# Patient Record
Sex: Female | Born: 1945 | Race: White | Hispanic: No | Smoking: Never smoker
Health system: Southern US, Community
[De-identification: ages and names within clinical notes are randomized; demographics above are authoritative.]

## PROBLEM LIST (undated history)

## (undated) DIAGNOSIS — Z86718 Personal history of other venous thrombosis and embolism: Secondary | ICD-10-CM

## (undated) DIAGNOSIS — J45909 Unspecified asthma, uncomplicated: Secondary | ICD-10-CM

## (undated) DIAGNOSIS — B019 Varicella without complication: Secondary | ICD-10-CM

## (undated) DIAGNOSIS — T7840XA Allergy, unspecified, initial encounter: Secondary | ICD-10-CM

## (undated) DIAGNOSIS — E7800 Pure hypercholesterolemia, unspecified: Secondary | ICD-10-CM

## (undated) DIAGNOSIS — I10 Essential (primary) hypertension: Secondary | ICD-10-CM

## (undated) DIAGNOSIS — J449 Chronic obstructive pulmonary disease, unspecified: Secondary | ICD-10-CM

## (undated) HISTORY — DX: Allergy, unspecified, initial encounter: T78.40XA

## (undated) HISTORY — PX: TUBAL LIGATION: SHX77

## (undated) HISTORY — DX: Varicella without complication: B01.9

## (undated) HISTORY — DX: Pure hypercholesterolemia, unspecified: E78.00

## (undated) HISTORY — DX: Essential (primary) hypertension: I10

## (undated) HISTORY — DX: Personal history of other venous thrombosis and embolism: Z86.718

## (undated) HISTORY — DX: Unspecified asthma, uncomplicated: J45.909

---

## 2006-01-30 HISTORY — PX: OTHER SURGICAL HISTORY: SHX169

## 2008-06-10 ENCOUNTER — Ambulatory Visit: Payer: Self-pay

## 2008-06-17 ENCOUNTER — Ambulatory Visit: Payer: Self-pay

## 2008-07-06 ENCOUNTER — Ambulatory Visit: Payer: Self-pay | Admitting: Family Medicine

## 2008-07-08 ENCOUNTER — Inpatient Hospital Stay: Payer: Self-pay | Admitting: Internal Medicine

## 2010-04-11 ENCOUNTER — Ambulatory Visit: Payer: Self-pay | Admitting: Family Medicine

## 2010-04-30 LAB — HM COLONOSCOPY

## 2010-11-02 ENCOUNTER — Ambulatory Visit: Payer: Self-pay | Admitting: Family Medicine

## 2011-03-28 ENCOUNTER — Ambulatory Visit: Payer: Self-pay | Admitting: Family Medicine

## 2011-03-28 LAB — CREATININE, SERUM
Creatinine: 1.27 mg/dL (ref 0.60–1.30)
EGFR (African American): 54 — ABNORMAL LOW
EGFR (Non-African Amer.): 45 — ABNORMAL LOW

## 2012-04-29 ENCOUNTER — Encounter: Payer: Self-pay | Admitting: Internal Medicine

## 2012-04-29 ENCOUNTER — Ambulatory Visit (INDEPENDENT_AMBULATORY_CARE_PROVIDER_SITE_OTHER): Payer: Medicare Other | Admitting: Internal Medicine

## 2012-04-29 VITALS — BP 120/70 | HR 85 | Temp 97.5°F | Ht 63.5 in | Wt 212.5 lb

## 2012-04-29 DIAGNOSIS — I10 Essential (primary) hypertension: Secondary | ICD-10-CM

## 2012-04-29 DIAGNOSIS — Z86718 Personal history of other venous thrombosis and embolism: Secondary | ICD-10-CM

## 2012-04-29 DIAGNOSIS — Z1239 Encounter for other screening for malignant neoplasm of breast: Secondary | ICD-10-CM

## 2012-04-29 DIAGNOSIS — Z9109 Other allergy status, other than to drugs and biological substances: Secondary | ICD-10-CM

## 2012-04-29 DIAGNOSIS — J45909 Unspecified asthma, uncomplicated: Secondary | ICD-10-CM

## 2012-04-29 DIAGNOSIS — E7800 Pure hypercholesterolemia, unspecified: Secondary | ICD-10-CM

## 2012-04-29 DIAGNOSIS — R197 Diarrhea, unspecified: Secondary | ICD-10-CM

## 2012-04-29 LAB — COMPREHENSIVE METABOLIC PANEL
Alkaline Phosphatase: 64 U/L (ref 39–117)
BUN: 17 mg/dL (ref 6–23)
CO2: 30 mEq/L (ref 19–32)
Creatinine, Ser: 1.2 mg/dL (ref 0.4–1.2)
GFR: 47.4 mL/min — ABNORMAL LOW (ref >60.00)
Glucose, Bld: 101 mg/dL — ABNORMAL HIGH (ref 70–99)
Total Bilirubin: 1.1 mg/dL (ref 0.3–1.2)
Total Protein: 7.9 g/dL (ref 6.0–8.3)

## 2012-04-29 LAB — CBC WITH DIFFERENTIAL/PLATELET
Basophils Relative: 0.9 % (ref 0.0–3.0)
Eosinophils Relative: 3.8 % (ref 0.0–5.0)
HCT: 41.9 % (ref 36.0–46.0)
Hemoglobin: 14.3 g/dL (ref 12.0–15.0)
Lymphs Abs: 2.3 10*3/uL (ref 0.7–4.0)
MCV: 85.6 fl (ref 78.0–100.0)
Monocytes Absolute: 0.5 10*3/uL (ref 0.1–1.0)
Monocytes Relative: 6.2 % (ref 3.0–12.0)
Neutro Abs: 4.5 10*3/uL (ref 1.4–7.7)
WBC: 7.7 10*3/uL (ref 4.5–10.5)

## 2012-04-29 LAB — LIPID PANEL
Cholesterol: 260 mg/dL — ABNORMAL HIGH (ref 0–200)
HDL: 47 mg/dL (ref >39.00)
Triglycerides: 212 mg/dL — ABNORMAL HIGH (ref 0.0–149.0)
VLDL: 42.4 mg/dL — ABNORMAL HIGH (ref 0.0–40.0)

## 2012-04-29 MED ORDER — LOSARTAN POTASSIUM-HCTZ 50-12.5 MG PO TABS: 1.0000 | ORAL_TABLET | Freq: Every day | ORAL | Status: DC

## 2012-04-29 MED ORDER — FLUTICASONE PROPIONATE 50 MCG/ACT NA SUSP: 2.0000 | Freq: Every day | NASAL | Status: DC

## 2012-04-29 MED ORDER — CITALOPRAM HYDROBROMIDE 20 MG PO TABS: 20.0000 mg | ORAL_TABLET | Freq: Every day | ORAL | Status: DC

## 2012-05-22 ENCOUNTER — Encounter: Payer: Self-pay | Admitting: Internal Medicine

## 2012-05-22 ENCOUNTER — Other Ambulatory Visit: Payer: Self-pay | Admitting: Internal Medicine

## 2012-05-22 DIAGNOSIS — I10 Essential (primary) hypertension: Secondary | ICD-10-CM | POA: Insufficient documentation

## 2012-05-22 DIAGNOSIS — R197 Diarrhea, unspecified: Secondary | ICD-10-CM | POA: Insufficient documentation

## 2012-05-22 DIAGNOSIS — Z86718 Personal history of other venous thrombosis and embolism: Secondary | ICD-10-CM | POA: Insufficient documentation

## 2012-05-22 DIAGNOSIS — Z9109 Other allergy status, other than to drugs and biological substances: Secondary | ICD-10-CM | POA: Insufficient documentation

## 2012-05-22 DIAGNOSIS — J45909 Unspecified asthma, uncomplicated: Secondary | ICD-10-CM | POA: Insufficient documentation

## 2012-05-22 DIAGNOSIS — E7800 Pure hypercholesterolemia, unspecified: Secondary | ICD-10-CM | POA: Insufficient documentation

## 2012-05-22 MED ORDER — PRAVASTATIN SODIUM 10 MG PO TABS: 10.0000 mg | ORAL_TABLET | Freq: Every day | ORAL | Status: DC

## 2012-05-22 NOTE — Assessment & Plan Note (Signed)
Off Lipitor (now for one year).  Check lipid panel.  Low cholesterol diet.

## 2012-05-22 NOTE — Assessment & Plan Note (Signed)
Uses Flonase.  Follow.   

## 2012-05-22 NOTE — Progress Notes (Signed)
Order placed for liver panel in 6 weeks.  Pravastatin rx sent in to [facility].

## 2012-05-22 NOTE — Assessment & Plan Note (Signed)
Blood pressure as outlined.  Same medication regimen.  Check metabolic panel.  Follow.

## 2012-05-22 NOTE — Assessment & Plan Note (Signed)
Need to obtain more information.  Apparently worked up.  (2008).

## 2012-05-22 NOTE — Assessment & Plan Note (Signed)
Bowels stable.  Follow.   

## 2012-05-22 NOTE — Progress Notes (Signed)
  Subjective:    [name], female    DOB: 28-Oct-1945, 67 y.o.   MRN: [id number]  HPI 67 year old female with past history of asthma, allergies/bronchitis, hypertension and hypercholesterolemia who comes in today to follow up on these issues as well as to establish care.  Was previously seeing Dr Rolm Gala.  Has some intermittent issues with increased drainage and some cough.  Takes prn Benadryl.  She was previously on Lipitor.  Has been off for one year.  Discussed low cholesterol diet.  Breathing overall stable.  Has some issues with sinus problems - approximately 2x/year.  She has a previous history of DVT (2008).  Is s/p laser surgery.  Eating and drinking well.  Some diarrhea.  States she may have 3-4 loose stools per day.  Unchanged.  Feels bowels are stable.    Past Medical History  Diagnosis Date  . Asthma   . Allergy   . Hypercholesterolemia   . Hypertension   . Chicken pox   . History of blood clots     Review of Systems Patient denies any headache, lightheadedness or dizziness.  Intermittent sinus issues as outlined.   No chest pain, tightness or palpitations.  No increased shortness of breath, cough or congestion.  No nausea or vomiting.  No acid reflux.  No abdominal pain or cramping.  No bowel change.  Loose stools - stable.  No BRBPR or melana.  No urine change.        Objective:   Physical Exam Filed Vitals:   04/29/12 0929  BP: 120/70  Pulse: 85  Temp: 97.5 F (66.42 C)   67 year old female in no acute distress.   HEENT:  Nares- clear.  Oropharynx - without lesions. NECK:  Supple.  Nontender.  No audible bruit.  HEART:  Appears to be regular. LUNGS:  No crackles or wheezing audible.  Respirations even and unlabored.  RADIAL PULSE:  Equal bilaterally.  ABDOMEN:  Soft, nontender.  Bowel sounds present and normal.  No audible abdominal bruit.   EXTREMITIES:  No increased edema present.  DP pulses palpable and equal bilaterally.          Assessment &  Plan:  REOCCURRING SINUS/ALLERGY ISSUES.  Stable now.  Follow.  Uses Flonase.    HEALTH MAINTENANCE.  Schedule her for a physical next visit.  Obtain outside records for review.  Last colonoscopy (per her report) - 2012.  Recommended f/u (per pt) 10 years.  Schedule mammogram.

## 2012-05-22 NOTE — Assessment & Plan Note (Signed)
She stopped advair and spiriva.  Has albuterol inhaler if needed.  Follow.  Breathing stable.

## 2012-06-04 ENCOUNTER — Ambulatory Visit (INDEPENDENT_AMBULATORY_CARE_PROVIDER_SITE_OTHER): Payer: Medicare Other | Admitting: Internal Medicine

## 2012-06-04 ENCOUNTER — Encounter: Payer: Self-pay | Admitting: Internal Medicine

## 2012-06-04 VITALS — BP 120/80 | HR 72 | Temp 98.0°F | Ht 63.5 in | Wt 212.0 lb

## 2012-06-04 DIAGNOSIS — J45909 Unspecified asthma, uncomplicated: Secondary | ICD-10-CM

## 2012-06-04 DIAGNOSIS — R062 Wheezing: Secondary | ICD-10-CM

## 2012-06-04 MED ORDER — CEFUROXIME AXETIL 250 MG PO TABS: 250.0000 mg | ORAL_TABLET | Freq: Two times a day (BID) | ORAL | Status: DC

## 2012-06-04 MED ORDER — ALBUTEROL SULFATE (2.5 MG/3ML) 0.083% IN NEBU
2.5000 mg | INHALATION_SOLUTION | Freq: Once | RESPIRATORY_TRACT | Status: AC
  Administered 2012-06-04: 2.5 mg via RESPIRATORY_TRACT

## 2012-06-04 MED ORDER — PREDNISONE 10 MG PO TABS: ORAL_TABLET | ORAL | Status: DC

## 2012-06-04 NOTE — Progress Notes (Signed)
  Subjective:    [name], female    DOB: 11-13-1945, 67 y.o.   MRN: [id number]  Cough  67 year old female with past history of asthma, allergies/bronchitis, hypertension and hypercholesterolemia who comes in today as a work in with concerns regarding increased cough and congestion.  Symptoms started two weeks ago.  She first noticed increased sinus drainage and some left earache.  Now reports no significant sinus pressure. Still some drainage. Increased chest congestion and cough.  Chest soreness from coughing.  Increased wheezing and sob.  Taking benadryl.  Using albuterol q 4 hours.  Has been eating and drinking.  No vomiting.  No bowel change.     Past Medical History  Diagnosis Date  . Asthma   . Allergy   . Hypercholesterolemia   . Hypertension   . Chicken pox   . History of blood clots      Current Outpatient Prescriptions on File Prior to Visit  Medication Sig Dispense Refill  . citalopram (CELEXA) 20 MG tablet Take 1 tablet (20 mg total) by mouth daily.  30 tablet  3  . fluticasone (FLONASE) 50 MCG/ACT nasal spray Place 2 sprays into the nose daily.  16 g  1  . losartan-hydrochlorothiazide (HYZAAR) 50-12.5 MG per tablet Take 1 tablet by mouth daily.  30 tablet  3  . pravastatin (PRAVACHOL) 10 MG tablet Take 1 tablet (10 mg total) by mouth daily.  30 tablet  2   No current facility-administered medications on file prior to visit.    Review of Systems  Respiratory: Positive for cough.   Patient denies any headache, lightheadedness or dizziness.  No significant sinus pressure now.  No ear pain now.  Some increased drainage.  No chest pain, tightness or palpitations.  Increased cough, congestion and wheezing.  No nausea or vomiting.  No acid reflux.  No abdominal pain or cramping.  No bowel change.  Taking benedryl.  Using albuterol prn.          Objective:   Physical Exam  Filed Vitals:   06/04/12 1517  BP:   Pulse: 72  Temp:    67 year old female in no  acute distress.   HEENT:  Nares- clear.  Oropharynx - without lesions.  No tenderness to palpation over the sinuses.  TMs visualized - without erythema.   NECK:  Supple.  Nontender.   HEART:  Appears to be regular. LUNGS:  No crackles.  Some increased wheezing with forced expiration.  Increased cough with expiration/forced expiration.  RADIAL PULSE:  Equal bilaterally.         Assessment & Plan:  URI.  With increased cough, congestion and wheezing.  She was given an albuterol neb here in the office.  Noted increased air movement.  She did not feel as tight.  Will treat with prednisone taper starting at 60mg  and decreasing by 5mg  each day until off.  Pulmicort inhaler as directed.  Albuterol inhaler prn.  ceftin 250mg  bid x 10 days.  Saline nasal flushes and flonase as directed.  Stop the benadryl.  Use mucinex/robitussin as directed.  Follow.     HEALTH MAINTENANCE.  Schedule her for a physical next visit.  Obtain outside records for review.  Last colonoscopy (per her report) - 2012.  Recommended f/u (per pt) 10 years.

## 2012-06-04 NOTE — Patient Instructions (Signed)
I am going to give you an antibiotic (ceftin) 250mg  - take one twice a day.  I am also going to give you a prednisone taper to take as directed.  Use the steroid inhaler twice a day and albuterol inhaler as directed.  Can take mucinex in the am and robitussin in the evening.  Continue your nasal flushes.

## 2012-06-05 ENCOUNTER — Encounter: Payer: Self-pay | Admitting: Internal Medicine

## 2012-06-05 NOTE — Assessment & Plan Note (Signed)
Treat infection as outlined.  Follow.

## 2012-06-08 ENCOUNTER — Encounter: Payer: Self-pay | Admitting: Internal Medicine

## 2012-06-08 ENCOUNTER — Telehealth: Payer: Self-pay | Admitting: Internal Medicine

## 2012-06-08 NOTE — Telephone Encounter (Signed)
Needs a physical scheduled at the end of June.  Thanks.

## 2012-06-10 ENCOUNTER — Telehealth: Payer: Self-pay | Admitting: *Deleted

## 2012-06-10 NOTE — Telephone Encounter (Signed)
Left message for pt to call office

## 2012-06-10 NOTE — Telephone Encounter (Signed)
Pt returned call to schedule physical

## 2012-06-14 NOTE — Telephone Encounter (Signed)
Mailed appointment along with medicare questionaire

## 2012-07-01 ENCOUNTER — Other Ambulatory Visit (INDEPENDENT_AMBULATORY_CARE_PROVIDER_SITE_OTHER): Payer: Medicare Other

## 2012-07-01 ENCOUNTER — Telehealth: Payer: Self-pay | Admitting: Internal Medicine

## 2012-07-01 DIAGNOSIS — E7800 Pure hypercholesterolemia, unspecified: Secondary | ICD-10-CM

## 2012-07-01 LAB — HEPATIC FUNCTION PANEL
ALT: 22 U/L (ref 0–35)
Albumin: 3.7 g/dL (ref 3.5–5.2)
Total Protein: 7.1 g/dL (ref 6.0–8.3)

## 2012-07-01 MED ORDER — PRAVASTATIN SODIUM 10 MG PO TABS: 10.0000 mg | ORAL_TABLET | Freq: Every day | ORAL | Status: DC

## 2012-07-01 MED ORDER — LOSARTAN POTASSIUM-HCTZ 50-12.5 MG PO TABS: 1.0000 | ORAL_TABLET | Freq: Every day | ORAL | Status: DC

## 2012-07-01 MED ORDER — CITALOPRAM HYDROBROMIDE 20 MG PO TABS: 20.0000 mg | ORAL_TABLET | Freq: Every day | ORAL | Status: DC

## 2012-07-01 NOTE — Telephone Encounter (Signed)
Ok to refill x 1 to get her through until she can get back on schedule with her meds

## 2012-07-01 NOTE — Telephone Encounter (Signed)
Called rx's in to pharmacy & pt informed

## 2012-07-01 NOTE — Telephone Encounter (Signed)
Pt stated she lost all her meds.  And wanted to know if she could get refills on them or if you have any samples she could get till its time to refill them again [employer]

## 2012-07-01 NOTE — Telephone Encounter (Signed)
I called patient & she states that she can not find her Losartan, Pravastatin, & Celexa medications. Okay to refill?

## 2012-07-02 ENCOUNTER — Other Ambulatory Visit: Payer: Medicare Other

## 2012-07-02 ENCOUNTER — Encounter: Payer: Self-pay | Admitting: *Deleted

## 2012-07-05 ENCOUNTER — Ambulatory Visit (INDEPENDENT_AMBULATORY_CARE_PROVIDER_SITE_OTHER): Payer: Medicare Other | Admitting: Internal Medicine

## 2012-07-05 ENCOUNTER — Encounter: Payer: Self-pay | Admitting: Internal Medicine

## 2012-07-05 ENCOUNTER — Telehealth: Payer: Self-pay | Admitting: *Deleted

## 2012-07-05 VITALS — BP 110/70 | HR 75 | Temp 97.9°F | Ht 63.5 in | Wt 217.5 lb

## 2012-07-05 DIAGNOSIS — I10 Essential (primary) hypertension: Secondary | ICD-10-CM

## 2012-07-05 DIAGNOSIS — E7800 Pure hypercholesterolemia, unspecified: Secondary | ICD-10-CM

## 2012-07-05 MED ORDER — METAXALONE 800 MG PO TABS: ORAL_TABLET | ORAL | Status: DC

## 2012-07-05 NOTE — Telephone Encounter (Signed)
Called [phone] for Prior Authorization on the Metaxalone 800 mg,  form is being faxed over

## 2012-07-07 ENCOUNTER — Encounter: Payer: Self-pay | Admitting: Internal Medicine

## 2012-07-07 NOTE — Assessment & Plan Note (Addendum)
Will stop the pravastatin.  Low cholesterol diet.  Will follow.  Remain of cholesterol medication for now.

## 2012-07-07 NOTE — Assessment & Plan Note (Signed)
Feels her breathing is stable.  Follow.   

## 2012-07-07 NOTE — Progress Notes (Signed)
Subjective:    [name], female    DOB: [birth year], 67 y.o.   MRN: [id number]  Cough  67 year old female with past history of asthma, allergies/bronchitis, hypertension and hypercholesterolemia who comes in today as a work in with concerns regarding - not feeling well.  States she feels bad.  Reports some increased "muscle soreness" - localized to her upper abdomen. No abdominal pain.  Has been eating and drinking.  No vomiting.  No bowel change.  Breathing stable.  No chest pain or tightness.  States she feels her symptoms are related to the pravastatin.  Started after she started on this medication.  States she felt similar symptoms with a previous cholesterol medication.  No fever.  No headache.     Past Medical History  Diagnosis Date  . Asthma   . Allergy   . Hypercholesterolemia   . Hypertension   . Chicken pox   . History of blood clots     Current Outpatient Prescriptions on File Prior to Visit  Medication Sig Dispense Refill  . albuterol (PROVENTIL HFA) 108 (90 BASE) MCG/ACT inhaler Inhale 2 puffs into the lungs every 6 (six) hours as needed for wheezing.      . citalopram (CELEXA) 20 MG tablet Take 1 tablet (20 mg total) by mouth daily.  30 tablet  1  . fluticasone (FLONASE) 50 MCG/ACT nasal spray Place 2 sprays into the nose daily.  16 g  1  . losartan-hydrochlorothiazide (HYZAAR) 50-12.5 MG per tablet Take 1 tablet by mouth daily.  30 tablet  1  . pravastatin (PRAVACHOL) 10 MG tablet Take 1 tablet (10 mg total) by mouth daily.  30 tablet  1   No current facility-administered medications on file prior to visit.    Review of Systems  Respiratory: Positive for cough.   Patient denies any headache, lightheadedness or dizziness.  No significant sinus pressure now.  No ear pain now.   No chest pain, tightness or palpitations.  Some cough, but she states she has been helping her son clean.  No increased congestion or wheezing.  Feels her breathing is stable.   No  nausea or vomiting.  No acid reflux.  Reports what she describes as muscle soreness in her upper abdomen.  No other abdominal pain or cramping.  No bowel change.  T         Objective:   Physical Exam  Filed Vitals:   07/05/12 1408  BP: 110/70  Pulse: 75  Temp: 97.9 F (19.16 C)   67 year old female in no acute distress.   HEENT:  Nares- clear.  Oropharynx - without lesions.   NECK:  Supple.  Non tender.   HEART:  Appears to be regular. LUNGS:  No crackles.  No increased wheezing.  Increased air movement.   RADIAL PULSE:  Equal bilaterally.  ABDOMEN:  Non tender to palpation.  Bowel sounds present and normal.  No reproducible pain to palpation.   SKIN:  No rash.         Assessment & Plan:  PAIN.  Describes the muscle soreness as outlined.  She relates it to the pravastatin.  Cannot reproduce the pain on exam.  Will stop the pravastatin.  Hold on further w/up.  Skelaxin and tylenol as directed.  Follow.  Notify me or be reevaluated if symptoms do not resolve or if they worsen.    URI.  Resolved.  Feels her breathing is back to baseline.  HEALTH MAINTENANCE.  Schedule her for a physical next visit.  Obtain outside records for review.  Last colonoscopy (per her report) - 2012.  Recommended f/u (per pt) 10 years.

## 2012-07-07 NOTE — Assessment & Plan Note (Signed)
Blood pressure as outlined.  Same medication regimen.  Follow.   

## 2012-07-22 ENCOUNTER — Other Ambulatory Visit: Payer: Self-pay | Admitting: *Deleted

## 2012-07-22 MED ORDER — LOSARTAN POTASSIUM-HCTZ 50-12.5 MG PO TABS: 1.0000 | ORAL_TABLET | Freq: Every day | ORAL | Status: DC

## 2012-07-22 NOTE — Telephone Encounter (Signed)
Pharmacy Note:  Losartan/Hctz 50-12.5 mg tab  [employer] is asking for a 90 day supply,please send new Rx

## 2012-07-23 ENCOUNTER — Other Ambulatory Visit: Payer: Self-pay | Admitting: *Deleted

## 2012-07-23 MED ORDER — LOSARTAN POTASSIUM-HCTZ 50-12.5 MG PO TABS: 1.0000 | ORAL_TABLET | Freq: Every day | ORAL | Status: DC

## 2012-07-23 NOTE — Telephone Encounter (Signed)
Pharmacy Note:  Losartan-hydrochlorothiazide   Can we get a Rx for 90 day supply?

## 2012-08-13 ENCOUNTER — Ambulatory Visit (INDEPENDENT_AMBULATORY_CARE_PROVIDER_SITE_OTHER): Payer: Medicare Other | Admitting: Internal Medicine

## 2012-08-13 ENCOUNTER — Ambulatory Visit (INDEPENDENT_AMBULATORY_CARE_PROVIDER_SITE_OTHER)
Admission: RE | Admit: 2012-08-13 | Discharge: 2012-08-13 | Disposition: A | Discharge status: Home / Self Care | Payer: Medicare Other | Source: Ambulatory Visit | Attending: Internal Medicine | Admitting: Internal Medicine

## 2012-08-13 ENCOUNTER — Encounter: Payer: Self-pay | Admitting: Internal Medicine

## 2012-08-13 VITALS — BP 110/70 | HR 89 | Temp 98.1°F | Ht 64.0 in | Wt 211.5 lb

## 2012-08-13 DIAGNOSIS — R079 Chest pain, unspecified: Secondary | ICD-10-CM

## 2012-08-13 DIAGNOSIS — R0781 Pleurodynia: Secondary | ICD-10-CM

## 2012-08-13 DIAGNOSIS — E7800 Pure hypercholesterolemia, unspecified: Secondary | ICD-10-CM

## 2012-08-13 DIAGNOSIS — I10 Essential (primary) hypertension: Secondary | ICD-10-CM

## 2012-08-13 DIAGNOSIS — Z9109 Other allergy status, other than to drugs and biological substances: Secondary | ICD-10-CM

## 2012-08-13 DIAGNOSIS — M549 Dorsalgia, unspecified: Secondary | ICD-10-CM

## 2012-08-13 DIAGNOSIS — R42 Dizziness and giddiness: Secondary | ICD-10-CM

## 2012-08-13 DIAGNOSIS — R5383 Other fatigue: Secondary | ICD-10-CM

## 2012-08-13 DIAGNOSIS — R5381 Other malaise: Secondary | ICD-10-CM

## 2012-08-13 LAB — COMPREHENSIVE METABOLIC PANEL
AST: 23 U/L (ref 0–37)
Albumin: 4.3 g/dL (ref 3.5–5.2)
Alkaline Phosphatase: 58 U/L (ref 39–117)
Calcium: 9.8 mg/dL (ref 8.4–10.5)
Chloride: 99 mEq/L (ref 96–112)
Glucose, Bld: 101 mg/dL — ABNORMAL HIGH (ref 70–99)
Potassium: 3.9 mEq/L (ref 3.5–5.1)
Sodium: 137 mEq/L (ref 135–145)
Total Protein: 7.6 g/dL (ref 6.0–8.3)

## 2012-08-13 LAB — CBC WITH DIFFERENTIAL/PLATELET
Basophils Absolute: 0.1 10*3/uL (ref 0.0–0.1)
Eosinophils Absolute: 0.2 10*3/uL (ref 0.0–0.7)
Lymphocytes Relative: 39.1 % (ref 12.0–46.0)
MCHC: 33.8 g/dL (ref 30.0–36.0)
MCV: 88.5 fl (ref 78.0–100.0)
Monocytes Absolute: 0.5 10*3/uL (ref 0.1–1.0)
Neutrophils Relative %: 51.4 % (ref 43.0–77.0)
Platelets: 298 10*3/uL (ref 150.0–400.0)
RDW: 13.5 % (ref 11.5–14.6)

## 2012-08-13 LAB — TSH: TSH: 2.78 u[IU]/mL (ref 0.35–5.50)

## 2012-08-14 ENCOUNTER — Other Ambulatory Visit: Payer: Self-pay | Admitting: *Deleted

## 2012-08-14 ENCOUNTER — Encounter: Payer: Self-pay | Admitting: Internal Medicine

## 2012-08-14 ENCOUNTER — Other Ambulatory Visit: Payer: Self-pay | Admitting: Internal Medicine

## 2012-08-14 DIAGNOSIS — N289 Disorder of kidney and ureter, unspecified: Secondary | ICD-10-CM

## 2012-08-14 MED ORDER — LOSARTAN POTASSIUM 50 MG PO TABS: 50.0000 mg | ORAL_TABLET | Freq: Every day | ORAL | Status: DC

## 2012-08-14 NOTE — Progress Notes (Signed)
Subjective:    [name], female    DOB: 04/20/1945, 67 y.o.   MRN: [id number]  HPI 67 year old female with past history of asthma, allergies/bronchitis, hypertension and hypercholesterolemia who comes in today to follow up on these issues as well as for her complete physical exam.   She reports that over the last two weeks she has noticed having light headedness.  Notices when she bend over or turns her head quickly.  Has not felt as good.  Still having the lower anterior bilateral rib pain and pain radiating around bilaterally to her back.  No rash.  Felt some better with stopping the pravastatin.  (see last note for details).  Still with pain.  Increased fatigue.  Some sob with exertion.  No increased cough or congestion.  No other chest pain.  Also reports some bilateral arm numbness.  Intermittent.  Notices at night.  Also reports some esophageal spasm.  States this has been an issue for years.  No significant acid reflux.     Past Medical History  Diagnosis Date  . Asthma   . Allergy   . Hypercholesterolemia   . Hypertension   . Chicken pox   . History of blood clots     Current Outpatient Prescriptions on File Prior to Visit  Medication Sig Dispense Refill  . albuterol (PROVENTIL HFA) 108 (90 BASE) MCG/ACT inhaler Inhale 2 puffs into the lungs every 6 (six) hours as needed for wheezing.      . citalopram (CELEXA) 20 MG tablet Take 1 tablet (20 mg total) by mouth daily.  30 tablet  1  . co-enzyme Q-10 30 MG capsule Take 100 mg by mouth daily.      . fluticasone (FLONASE) 50 MCG/ACT nasal spray Place 2 sprays into the nose daily.  16 g  1  . losartan-hydrochlorothiazide (HYZAAR) 50-12.5 MG per tablet Take 1 tablet by mouth daily.  90 tablet  1   No current facility-administered medications on file prior to visit.    Review of Systems Patient denies any headache.  Does report the lightheadedness as outlined.  No sinus or allergy symptoms currently.  No chest pain,  tightness or palpitations.  No increased cough or congestion.  Does report some sob.  Increased fatigue.  Just does not feel as well.  No nausea or vomiting.  No acid reflux.  Does report issues with esophageal spasm.  No abdominal pain or cramping.  Bowels stable.  No BRBPR or melana.  No urine change.  Still with persistent pain bilateral ribs/back.  No rash.       Objective:   Physical Exam  Filed Vitals:   08/13/12 1408  BP: 110/70  Pulse: 89  Temp: 98.1 F (36.7 C)   Blood pressure recheck:  124/72 (not orthostatic on exam).   67 year old female in no acute distress.  Some reproducible light headedness with going from sitting to lying on exam table.   HEENT:  Nares- clear.  Oropharynx - without lesions. NECK:  Supple.  Nontender.  No audible bruit.  HEART:  Appears to be regular. LUNGS:  No crackles or wheezing audible.  Respirations even and unlabored.  RADIAL PULSE:  Equal bilaterally.    BREASTS:  No nipple discharge or nipple retraction present.  Could not appreciate any distinct nodules or axillary adenopathy.  ABDOMEN:  Soft, nontender.  Bowel sounds present and normal.  No audible abdominal bruit.  GU:  Not performed.    EXTREMITIES:  No increased edema present.  DP pulses palpable and equal bilaterally.          Assessment & Plan:  CARDIOVASCULAR.  Dizziness as outlined.  Some sob with exertion, but she has some baseline sob.  Increased fatigue.  Given the persistent symptoms, EKG obtained and revealed SR with TWI in v2 and minimal ST depression in v3.  Will obtain ECHO to further evaluate valve status, LV function and for any wall motion abnormality.    LIGHT HEADEDNESS.  Has a history of "inner ear" per pt.  Light headedness as outlined.  Exam as outlined.  Will have ENT evaluate given persistent symptoms.  Not orthostatic on exam.  Check labs.  May need to adjust her medication.    RIB PAIN.  Describes the pain in her bilateral anterior ribs that extends around to her  back.  Will obtain thoracic spine xray and lumbar xray.  Further w/up pending above.    REOCCURRING SINUS/ALLERGY ISSUES.  Stable now.  Follow.  Uses Flonase.    HEALTH MAINTENANCE.  Physical today.   Last colonoscopy (per her report) - 2012.  Recommended f/u (per pt) 10 years.  Mammogram scheduled previously.  Need results.   I spent 45 minutes with this pt and more than 50% of the time was spent in consultation regarding the above.

## 2012-08-14 NOTE — Assessment & Plan Note (Signed)
Feels her breathing is relatively stable.  Follow.

## 2012-08-14 NOTE — Assessment & Plan Note (Signed)
Uses Flonase.  Follow.   

## 2012-08-14 NOTE — Progress Notes (Signed)
Order placed for f/u lab.   

## 2012-08-14 NOTE — Assessment & Plan Note (Signed)
Blood pressure as outlined.  Same medication regimen.  Check metabolic panel.  Not orthostatic on exam.  May consider changing to Losartan 50mg  q day (without HCTZ).

## 2012-08-14 NOTE — Telephone Encounter (Signed)
Pt notified of change in BP meds

## 2012-08-14 NOTE — Assessment & Plan Note (Signed)
Off pravastatin.  Low cholesterol diet.  Pain improved some.  Remain off for now.  Follow.

## 2012-08-15 ENCOUNTER — Telehealth: Payer: Self-pay | Admitting: *Deleted

## 2012-08-15 NOTE — Telephone Encounter (Signed)
Error

## 2012-08-26 ENCOUNTER — Other Ambulatory Visit (INDEPENDENT_AMBULATORY_CARE_PROVIDER_SITE_OTHER): Payer: Medicare Other

## 2012-08-26 DIAGNOSIS — N289 Disorder of kidney and ureter, unspecified: Secondary | ICD-10-CM

## 2012-08-26 LAB — BASIC METABOLIC PANEL
CO2: 30 mEq/L (ref 19–32)
Calcium: 10.2 mg/dL (ref 8.4–10.5)
Creatinine, Ser: 1 mg/dL (ref 0.4–1.2)

## 2012-08-27 ENCOUNTER — Encounter: Payer: Self-pay | Admitting: *Deleted

## 2012-08-28 ENCOUNTER — Ambulatory Visit (INDEPENDENT_AMBULATORY_CARE_PROVIDER_SITE_OTHER): Payer: Medicare Other | Admitting: Adult Health

## 2012-08-28 ENCOUNTER — Encounter: Payer: Self-pay | Admitting: Adult Health

## 2012-08-28 VITALS — BP 124/76 | HR 68 | Temp 97.7°F | Resp 12 | Wt 218.5 lb

## 2012-08-28 DIAGNOSIS — R079 Chest pain, unspecified: Secondary | ICD-10-CM

## 2012-08-28 DIAGNOSIS — R0781 Pleurodynia: Secondary | ICD-10-CM | POA: Insufficient documentation

## 2012-08-28 MED ORDER — BACLOFEN 10 MG PO TABS: 10.0000 mg | ORAL_TABLET | Freq: Three times a day (TID) | ORAL | Status: DC

## 2012-08-28 MED ORDER — HYDROCODONE-ACETAMINOPHEN 5-325 MG PO TABS: 1.0000 | ORAL_TABLET | Freq: Four times a day (QID) | ORAL | Status: DC | PRN

## 2012-08-28 NOTE — Progress Notes (Signed)
  Subjective:    [name], female    DOB: November 13, 1945, 67 y.o.   MRN: [id number]  HPI  Patient presents with rib pain that began back in June. She denies any injury causing this pain. She was seen on the 08/13/12 for this problem. She had xrays done of thoracic and lumbar spine which showed some degenerative changes. Patient reports that she had been started on a statin when, shortly after, this pain began. She has been off the statin for > 1 month with no resolution of symptoms. 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 She denies chest pain with inspiration, shortness of breath, fever, malaise. She denies osteopenia or osteoporosis. Patient has been taking ibuprofen as needed with only some improvement of her symptoms.   Current Outpatient Prescriptions on File Prior to Visit  Medication Sig Dispense Refill  . albuterol (PROVENTIL HFA) 108 (90 BASE) MCG/ACT inhaler Inhale 2 puffs into the lungs every 6 (six) hours as needed for wheezing.      Marland Kitchen co-enzyme Q-10 30 MG capsule Take 100 mg by mouth daily.      . fluticasone (FLONASE) 50 MCG/ACT nasal spray Place 2 sprays into the nose daily.  16 g  1  . citalopram (CELEXA) 20 MG tablet Take 1 tablet (20 mg total) by mouth daily.  30 tablet  1  . losartan (COZAAR) 50 MG tablet Take 1 tablet (50 mg total) by mouth daily.  30 tablet  5   No current facility-administered medications on file prior to visit.     Review of Systems  Respiratory: Negative.   Cardiovascular: Negative.   Gastrointestinal: Negative.   Musculoskeletal:       Rib pain all across and around to back       Objective:   Physical Exam  Constitutional: She is oriented to person, place, and time.  Overweight, pleasant female appearing uncomfortable especially with movement.  Cardiovascular: Normal rate, regular rhythm and normal heart sounds.   Pulmonary/Chest: Effort normal and breath sounds  normal. No respiratory distress. She has no wheezes. She has no rales.  Abdominal: Bowel sounds are normal. She exhibits no distension and no mass. There is no tenderness. There is no rebound and no guarding.  Musculoskeletal: She exhibits tenderness. She exhibits no edema.  Rib tenderness upon palpation. Body habitus inhibits palpation of ribs somewhat.  Neurological: She is alert and oriented to person, place, and time. No cranial nerve deficit. Coordination normal.  Skin: Skin is warm and dry.  Psychiatric: She has a normal mood and affect. Her behavior is normal. Judgment and thought content normal.          Assessment & Plan:

## 2012-08-28 NOTE — Addendum Note (Signed)
Addended by: Novella Olive on: 08/28/2012 05:20 PM   Modules accepted: Level of Service

## 2012-08-28 NOTE — Patient Instructions (Addendum)
  Baclofen 10 mg 3 times a day as needed for muscle spasms.  Vicodin 1 tablet every 6 hours as needed for pain.  Both medications can cause sleepiness. Do not drive while taking this medication.  Rib x-ray at the [facility] office tomorrow.  Please have lab work done today before leaving the office.  Follow up with Dr. Lorin Picket in 1 week.

## 2012-08-28 NOTE — Assessment & Plan Note (Addendum)
Ongoing > 1 month. Right and left rib xray. Check sed rate, ck, crp. Norco 1 every 6 hours as needed for pain. Flexeril 3 times a day as needed for muscle spasms. Followup in one week with Dr. Lorin Picket. Note, greater than 30 min were spent in face to face communication with patient in the assessment, reviewing previous medical records, evaluation, planning and implementation of care pertaining to this problem.

## 2012-08-29 ENCOUNTER — Ambulatory Visit (INDEPENDENT_AMBULATORY_CARE_PROVIDER_SITE_OTHER)
Admission: RE | Admit: 2012-08-29 | Discharge: 2012-08-29 | Disposition: A | Discharge status: Home / Self Care | Payer: Medicare Other | Source: Ambulatory Visit | Attending: Adult Health | Admitting: Adult Health

## 2012-08-29 ENCOUNTER — Ambulatory Visit (INDEPENDENT_AMBULATORY_CARE_PROVIDER_SITE_OTHER): Payer: Medicare Other | Admitting: Cardiology

## 2012-08-29 DIAGNOSIS — R079 Chest pain, unspecified: Secondary | ICD-10-CM

## 2012-08-29 DIAGNOSIS — R0602 Shortness of breath: Secondary | ICD-10-CM

## 2012-08-29 DIAGNOSIS — R0609 Other forms of dyspnea: Secondary | ICD-10-CM

## 2012-08-29 DIAGNOSIS — R0781 Pleurodynia: Secondary | ICD-10-CM

## 2012-08-29 DIAGNOSIS — R0989 Other specified symptoms and signs involving the circulatory and respiratory systems: Secondary | ICD-10-CM

## 2012-08-29 LAB — CK: Total CK: 123 U/L (ref 7–177)

## 2012-09-03 ENCOUNTER — Ambulatory Visit (INDEPENDENT_AMBULATORY_CARE_PROVIDER_SITE_OTHER): Payer: Medicare Other | Admitting: Internal Medicine

## 2012-09-03 ENCOUNTER — Encounter: Payer: Self-pay | Admitting: Internal Medicine

## 2012-09-03 VITALS — BP 120/78 | HR 75 | Temp 98.1°F | Ht 64.0 in | Wt 214.0 lb

## 2012-09-03 DIAGNOSIS — R109 Unspecified abdominal pain: Secondary | ICD-10-CM

## 2012-09-03 DIAGNOSIS — I10 Essential (primary) hypertension: Secondary | ICD-10-CM

## 2012-09-03 NOTE — Assessment & Plan Note (Signed)
Blood pressure as outlined.  Same medication regimen.  Follow.   

## 2012-09-03 NOTE — Assessment & Plan Note (Signed)
Feels her breathing is relatively stable.  Follow.    

## 2012-09-03 NOTE — Progress Notes (Signed)
Subjective:    [name], female    DOB: 1945-12-23, 67 y.o.   MRN: [id number]  HPI 67 year old female with past history of asthma, allergies/bronchitis, hypertension and hypercholesterolemia who comes in today as a work in with concerns regarding persistent pain in her back and extending around her side and lower anterior ribs/upper abdomen.  I initially saw her and she felt pain started after statin treatment.  Her cholesterol medication was stopped and pain continued.  We then obtained a thoracic and lumbar spine xray.  Did not reveal any acute abnormality.  Saw Raquel for persistent pain.  Rib xray negative.  Was given Norco.  The pain medication helps when she takes it, but she has only taken four tablets since her visit last week.  No pain with deep breathing. Bowels stable.  No other abdominal pain.  She did have some bilious emesis.  Does not appear to worsen with eating.  Bending does not make it worse.      Past Medical History  Diagnosis Date  . Asthma   . Allergy   . Hypercholesterolemia   . Hypertension   . Chicken pox   . History of blood clots     Current Outpatient Prescriptions on File Prior to Visit  Medication Sig Dispense Refill  . albuterol (PROVENTIL HFA) 108 (90 BASE) MCG/ACT inhaler Inhale 2 puffs into the lungs every 6 (six) hours as needed for wheezing.      . baclofen (LIORESAL) 10 MG tablet Take 1 tablet (10 mg total) by mouth 3 (three) times daily.  30 each  0  . citalopram (CELEXA) 20 MG tablet Take 1 tablet (20 mg total) by mouth daily.  30 tablet  1  . co-enzyme Q-10 30 MG capsule Take 100 mg by mouth daily.      . fluticasone (FLONASE) 50 MCG/ACT nasal spray Place 2 sprays into the nose daily.  16 g  1  . HYDROcodone-acetaminophen (NORCO/VICODIN) 5-325 MG per tablet Take 1 tablet by mouth every 6 (six) hours as needed for pain.  30 tablet  0  . losartan (COZAAR) 50 MG tablet Take 1 tablet (50 mg total) by mouth daily.  30 tablet  5   No current  facility-administered medications on file prior to visit.    Review of Systems Patient denies any headache.  No light headedness.  No sinus or allergy symptoms currently.  No chest pain, tightness or palpitations.  No increased cough or congestion.  Persistent lower anterior rib pain/upper abdominal pain, side pain (bilateral) and back pain.  Bilious emesis as outlined.  Eating does not aggravate.  Bowels stable.  No change with bending or twisting.       Objective:   Physical Exam  Filed Vitals:   09/03/12 0801  BP: 120/78  Pulse: 75  Temp: 98.1 F (56.4 C)   67 year old female in no acute distress. HEENT:  Nares- clear.  Oropharynx - without lesions. NECK:  Supple.  Nontender.  No audible bruit.  HEART:  Appears to be regular. LUNGS:  No crackles or wheezing audible.  Respirations even and unlabored.  RADIAL PULSE:  Equal bilaterally.   ABDOMEN:  Soft, nontender.  Bowel sounds present and normal.  No audible abdominal bruit.  BACK:  No reproducible pain to palpation over the back.   RIBS:  Minimal reproducible discomfort bilateral sides.    EXTREMITIES:  No increased edema present.       Assessment & Plan:  CARDIOVASCULAR.  Recent ECHO with normal LV function.  No regional wall motion abnormalities, mild diastolic dysfunction.  Currently stable.  Follow.    LIGHT HEADEDNESS.  Not an issue now.  Follow.    ABDOMINAL/BACK PAIN.  Unclear as to the exact etiology.  Initially felt to be more msk.  Xrays unrevealing of an acute abnormality.  No clearly reproducible on exam.  Does not appear to worsen with movement of deep breathing.  Had the bilious emesis as outlined.  Will obtain and abdominal CT to evaluate further.  Continue norco prn.  Bowels stable.     REOCCURRING SINUS/ALLERGY ISSUES.  Stable now.  Follow.  Uses Flonase.    HEALTH MAINTENANCE.  Physical 08/13/12.   Last colonoscopy (per her report) - 2012.  Recommended f/u (per pt) 10 years.  Mammogram scheduled previously.   Still need results.

## 2012-09-04 ENCOUNTER — Encounter: Payer: Self-pay | Admitting: Internal Medicine

## 2012-09-04 ENCOUNTER — Ambulatory Visit: Payer: Self-pay | Admitting: Internal Medicine

## 2012-09-13 ENCOUNTER — Encounter: Payer: Self-pay | Admitting: Internal Medicine

## 2012-09-13 ENCOUNTER — Other Ambulatory Visit: Payer: Self-pay | Admitting: *Deleted

## 2012-09-13 MED ORDER — HYDROCODONE-ACETAMINOPHEN 5-325 MG PO TABS: 1.0000 | ORAL_TABLET | Freq: Three times a day (TID) | ORAL | Status: DC | PRN

## 2012-09-13 NOTE — Telephone Encounter (Signed)
I refilled her hydrocodone.  Also, notify her that I would like to refer her to Dr Levi Aland.  I think her pain is coming from her back and causing the pain in her back and around her ribs.  He can give an injection if needed to help get the pain under control.  Also will assess her and see if need for MRI of back.  If agreeable, let me know and I will place order for referral.

## 2012-09-13 NOTE — Telephone Encounter (Signed)
Pt called requesting a refill-Okay to refill? (call when ready to pick up)

## 2012-09-13 NOTE — Telephone Encounter (Signed)
Left detailed message re: referral & informed her that Rx is ready for pick up

## 2012-09-16 ENCOUNTER — Telehealth: Payer: Self-pay | Admitting: *Deleted

## 2012-09-16 ENCOUNTER — Encounter: Payer: Self-pay | Admitting: *Deleted

## 2012-09-16 DIAGNOSIS — R0781 Pleurodynia: Secondary | ICD-10-CM

## 2012-09-16 DIAGNOSIS — M549 Dorsalgia, unspecified: Secondary | ICD-10-CM

## 2012-09-16 NOTE — Telephone Encounter (Signed)
Spoke with pt today, she is okay to proceed with referral to Dr. Yves Dill

## 2012-09-17 NOTE — Telephone Encounter (Signed)
Order placed for referral to Dr Chasnis.   

## 2012-09-20 ENCOUNTER — Encounter: Payer: Self-pay | Admitting: Adult Health

## 2012-09-20 ENCOUNTER — Ambulatory Visit (INDEPENDENT_AMBULATORY_CARE_PROVIDER_SITE_OTHER): Payer: Medicare Other | Admitting: Adult Health

## 2012-09-20 ENCOUNTER — Ambulatory Visit: Payer: Self-pay | Admitting: Adult Health

## 2012-09-20 ENCOUNTER — Other Ambulatory Visit: Payer: Self-pay | Admitting: Adult Health

## 2012-09-20 VITALS — BP 122/70 | HR 60 | Temp 98.0°F | Resp 14 | Wt 214.5 lb

## 2012-09-20 DIAGNOSIS — M79602 Pain in left arm: Secondary | ICD-10-CM

## 2012-09-20 DIAGNOSIS — M79609 Pain in unspecified limb: Secondary | ICD-10-CM

## 2012-09-20 DIAGNOSIS — I82609 Acute embolism and thrombosis of unspecified veins of unspecified upper extremity: Secondary | ICD-10-CM | POA: Insufficient documentation

## 2012-09-20 DIAGNOSIS — Z7901 Long term (current) use of anticoagulants: Secondary | ICD-10-CM

## 2012-09-20 MED ORDER — WARFARIN SODIUM 5 MG PO TABS: ORAL_TABLET | ORAL | Status: DC

## 2012-09-20 MED ORDER — ENOXAPARIN SODIUM 100 MG/ML ~~LOC~~ SOLN: 100.0000 mg | Freq: Two times a day (BID) | SUBCUTANEOUS | Status: DC

## 2012-09-20 NOTE — Progress Notes (Signed)
  Subjective:    Patient ID: Georgeanna Harrison, female    DOB: May 06, 1945, 67 y.o.   MRN: [id number]  HPI  Patient present to clinic with pain in left arm. She reports that the pain woke her up this morning. She denies lifting anything heavy or doing any activity that would contribute to the left arm pain. Her pain is in the left medial aspect of elbow, upper arm and shoulder. Rates pain 4/10. She has a hx of DVTs and reports "it feels just like when I had a blood clot". She has not taken anything for her pain.  She has no other symptom.   Current Outpatient Prescriptions on File Prior to Visit  Medication Sig Dispense Refill  . albuterol (PROVENTIL HFA) 108 (90 BASE) MCG/ACT inhaler Inhale 2 puffs into the lungs every 6 (six) hours as needed for wheezing.      . baclofen (LIORESAL) 10 MG tablet Take 1 tablet (10 mg total) by mouth 3 (three) times daily.  30 each  0  . citalopram (CELEXA) 20 MG tablet Take 1 tablet (20 mg total) by mouth daily.  30 tablet  1  . co-enzyme Q-10 30 MG capsule Take 100 mg by mouth daily.      . fluticasone (FLONASE) 50 MCG/ACT nasal spray Place 2 sprays into the nose daily.  16 g  1  . HYDROcodone-acetaminophen (NORCO/VICODIN) 5-325 MG per tablet Take 1 tablet by mouth every 8 (eight) hours as needed for pain.  30 tablet  0  . losartan (COZAAR) 50 MG tablet Take 1 tablet (50 mg total) by mouth daily.  30 tablet  5   No current facility-administered medications on file prior to visit.    Review of Systems  Musculoskeletal:       Pain left arm  Skin: Negative.   Neurological: Negative.   Psychiatric/Behavioral: Negative.    BP 122/70  Pulse 60  Temp(Src) 98 F (36.7 C) (Oral)  Resp 14  Wt 214 lb 8 oz (97.297 kg)  BMI 36.8 kg/m2  SpO2 95%  LMP 04/29/1997    Objective:   Physical Exam  Constitutional: She is oriented to person, place, and time.  Pulmonary/Chest: Effort normal.  Musculoskeletal:  LUE slightly more edematous than the RUE. There is a palpable  nodule on the antecubital fossa area.  Neurological: She is alert and oriented to person, place, and time.  Skin: Skin is warm.  Psychiatric: She has a normal mood and affect. Her behavior is normal. Judgment and thought content normal.      Assessment & Plan:

## 2012-09-20 NOTE — Assessment & Plan Note (Signed)
Patient with hx of DVTs. Left, upper arm pain with swelling worrisome for DVT. Send for ultrasound today.

## 2012-09-20 NOTE — Patient Instructions (Addendum)
  I am sending you for an ultrasound on your left arm.  Your appointment is this afternoon at 3:45 pm  I will call you with the results.

## 2012-09-25 ENCOUNTER — Other Ambulatory Visit: Payer: Self-pay | Admitting: Internal Medicine

## 2012-09-25 ENCOUNTER — Other Ambulatory Visit (INDEPENDENT_AMBULATORY_CARE_PROVIDER_SITE_OTHER): Payer: Medicare Other

## 2012-09-25 ENCOUNTER — Telehealth: Payer: Self-pay | Admitting: *Deleted

## 2012-09-25 DIAGNOSIS — I82602 Acute embolism and thrombosis of unspecified veins of left upper extremity: Secondary | ICD-10-CM

## 2012-09-25 DIAGNOSIS — I82609 Acute embolism and thrombosis of unspecified veins of unspecified upper extremity: Secondary | ICD-10-CM

## 2012-09-25 DIAGNOSIS — Z7901 Long term (current) use of anticoagulants: Secondary | ICD-10-CM

## 2012-09-25 LAB — CBC WITH DIFFERENTIAL/PLATELET
Eosinophils Relative: 6 % — ABNORMAL HIGH (ref 0–5)
HCT: 39.9 % (ref 36.0–46.0)
Lymphocytes Relative: 50 % — ABNORMAL HIGH (ref 12–46)
Lymphs Abs: 2.9 10*3/uL (ref 0.7–4.0)
MCV: 84.9 fL (ref 78.0–100.0)
Monocytes Absolute: 0.5 10*3/uL (ref 0.1–1.0)
RBC: 4.7 MIL/uL (ref 3.87–5.11)
RDW: 13.3 % (ref 11.5–15.5)
WBC: 5.8 10*3/uL (ref 4.0–10.5)

## 2012-09-25 NOTE — Addendum Note (Signed)
Addended by: Montine Circle D on: 09/25/2012 04:25 PM   Modules accepted: Orders

## 2012-09-25 NOTE — Addendum Note (Signed)
Addended by: Montine Circle D on: 09/25/2012 04:41 PM   Modules accepted: Orders

## 2012-09-25 NOTE — Addendum Note (Signed)
Addended by: Montine Circle D on: 09/25/2012 04:49 PM   Modules accepted: Orders

## 2012-09-25 NOTE — Progress Notes (Signed)
Order placed for f/u pt/inr 

## 2012-09-25 NOTE — Telephone Encounter (Signed)
Pt coming in at 4:30 both meds were given to pt by Raquel

## 2012-09-25 NOTE — Telephone Encounter (Signed)
noted 

## 2012-09-25 NOTE — Telephone Encounter (Signed)
Yes, she can come in and get pt/inr.  Need to know who saw her and where diagnosed with blood clot.  Need records.  Is she seeing anyone now for this?

## 2012-09-25 NOTE — Progress Notes (Signed)
Opened in error

## 2012-09-25 NOTE — Telephone Encounter (Signed)
Pt called to report that she had a blood clot in her arm and she is on Lovenox & Coumadin. Wants to know if she should have her blood checked soon? Today makes day 5. She has 3 more shots to go. Please advise

## 2012-09-25 NOTE — Addendum Note (Signed)
Addended by: Montine Circle D on: 09/25/2012 04:47 PM   Modules accepted: Orders

## 2012-09-27 ENCOUNTER — Other Ambulatory Visit (INDEPENDENT_AMBULATORY_CARE_PROVIDER_SITE_OTHER): Payer: Medicare Other

## 2012-09-27 DIAGNOSIS — I82602 Acute embolism and thrombosis of unspecified veins of left upper extremity: Secondary | ICD-10-CM

## 2012-09-27 DIAGNOSIS — I82609 Acute embolism and thrombosis of unspecified veins of unspecified upper extremity: Secondary | ICD-10-CM

## 2012-10-01 ENCOUNTER — Telehealth: Payer: Self-pay | Admitting: Internal Medicine

## 2012-10-01 ENCOUNTER — Encounter: Payer: Self-pay | Admitting: *Deleted

## 2012-10-01 MED ORDER — HYDROCODONE-ACETAMINOPHEN 5-325 MG PO TABS: 1.0000 | ORAL_TABLET | Freq: Three times a day (TID) | ORAL | Status: DC | PRN

## 2012-10-01 NOTE — Telephone Encounter (Signed)
rx ok'd for hydrocodone #30 with no refills.   

## 2012-10-01 NOTE — Telephone Encounter (Signed)
Rx left up front for pt to pick up (sent pt a mychart message to notify her)

## 2012-10-02 ENCOUNTER — Other Ambulatory Visit (INDEPENDENT_AMBULATORY_CARE_PROVIDER_SITE_OTHER): Payer: Medicare Other

## 2012-10-02 ENCOUNTER — Telehealth: Payer: Self-pay | Admitting: Emergency Medicine

## 2012-10-02 DIAGNOSIS — I82609 Acute embolism and thrombosis of unspecified veins of unspecified upper extremity: Secondary | ICD-10-CM

## 2012-10-02 LAB — PROTIME-INR
INR: 2.8 ratio — ABNORMAL HIGH (ref 0.8–1.0)
Prothrombin Time: 28.8 s — ABNORMAL HIGH (ref 10.2–12.4)

## 2012-10-02 NOTE — Telephone Encounter (Signed)
Can see if can get in with pain clinic - Dr Pernell Dupre.  Thanks.

## 2012-10-02 NOTE — Telephone Encounter (Signed)
Duplicate. See other message.

## 2012-10-02 NOTE — Telephone Encounter (Signed)
Called patient to see if she had taken care of the balance at [facility]. She informed she doesn't have the money to pay and ask if there was another option. Maybe the pain clinic @ [facility]?

## 2012-10-03 NOTE — Telephone Encounter (Signed)
Noted.  Let me know if I need to do anything more.  

## 2012-10-03 NOTE — Telephone Encounter (Signed)
Referral, notes and demo have been faxed to the [facility] for Dr. Jarvis Morgan review

## 2012-10-07 ENCOUNTER — Encounter: Payer: Self-pay | Admitting: Adult Health

## 2012-10-09 ENCOUNTER — Telehealth: Payer: Self-pay | Admitting: *Deleted

## 2012-10-09 ENCOUNTER — Other Ambulatory Visit (INDEPENDENT_AMBULATORY_CARE_PROVIDER_SITE_OTHER): Payer: Medicare Other

## 2012-10-09 DIAGNOSIS — I82609 Acute embolism and thrombosis of unspecified veins of unspecified upper extremity: Secondary | ICD-10-CM

## 2012-10-09 LAB — PROTIME-INR
INR: 6.2 ratio (ref 0.8–1.0)
Prothrombin Time: 63.6 s (ref 10.2–12.4)

## 2012-10-09 NOTE — Telephone Encounter (Signed)
Called pt and left her a message to not take her coumadin.  Also informed her that I needed to talk to her.  Unable to reach pt.  Left message.  The other phone number - not in service.

## 2012-10-09 NOTE — Telephone Encounter (Signed)
[facility] called  Critical:  PT: 63.6  INR: 6.2

## 2012-10-10 ENCOUNTER — Telehealth: Payer: Self-pay | Admitting: *Deleted

## 2012-10-10 ENCOUNTER — Other Ambulatory Visit (INDEPENDENT_AMBULATORY_CARE_PROVIDER_SITE_OTHER): Payer: Medicare Other

## 2012-10-10 ENCOUNTER — Other Ambulatory Visit: Payer: Self-pay | Admitting: Internal Medicine

## 2012-10-10 DIAGNOSIS — Z7901 Long term (current) use of anticoagulants: Secondary | ICD-10-CM

## 2012-10-10 DIAGNOSIS — Z5181 Encounter for therapeutic drug level monitoring: Secondary | ICD-10-CM

## 2012-10-10 LAB — PROTIME-INR
INR: 5.5 ratio (ref 0.8–1.0)
Prothrombin Time: 56.4 s (ref 10.2–12.4)

## 2012-10-10 NOTE — Telephone Encounter (Signed)
Please call pt and notify her that her level has drifted down some.  Remain off coumadin and recheck pt/inr on Monday 10/14/12.

## 2012-10-10 NOTE — Progress Notes (Signed)
Order placed for f/u pt/inr 

## 2012-10-10 NOTE — Telephone Encounter (Signed)
Pt notified & lab appt scheduled 

## 2012-10-10 NOTE — Telephone Encounter (Signed)
[facility] called with critical results on this patient as listed:  PT  56.4 INR 5.5

## 2012-10-10 NOTE — Telephone Encounter (Signed)
Pt notified 10/09/12 (1745) to hold coumadin.  She is having no problems with bleeding and has been eating normally.  No nausea, vomiting or change in medication.  Recheck pt/inr tomorrow.  We discussed the possibility of vitamin K.  Since history of clots, no bleeding - will hold and recheck today.  If continued increase - vitamin K 2.5 mg.  Pt comfortable with plan.  We discussed risk of bleeding.  If any change in symptoms, she is to be evaluated immediately.

## 2012-10-11 ENCOUNTER — Other Ambulatory Visit: Payer: Medicare Other

## 2012-10-14 ENCOUNTER — Telehealth: Payer: Self-pay | Admitting: Internal Medicine

## 2012-10-14 ENCOUNTER — Other Ambulatory Visit (INDEPENDENT_AMBULATORY_CARE_PROVIDER_SITE_OTHER): Payer: Medicare Other

## 2012-10-14 DIAGNOSIS — Z7901 Long term (current) use of anticoagulants: Secondary | ICD-10-CM

## 2012-10-14 DIAGNOSIS — Z5181 Encounter for therapeutic drug level monitoring: Secondary | ICD-10-CM

## 2012-10-14 NOTE — Telephone Encounter (Signed)
Pt coming in Wednesday 10/16/12 for repeat pt/inr.  Please put on schedule for 8:00.  Pt aware of appt.

## 2012-10-15 NOTE — Telephone Encounter (Signed)
Appointment made

## 2012-10-16 ENCOUNTER — Other Ambulatory Visit (INDEPENDENT_AMBULATORY_CARE_PROVIDER_SITE_OTHER): Payer: Medicare Other

## 2012-10-16 ENCOUNTER — Telehealth: Payer: Self-pay | Admitting: Internal Medicine

## 2012-10-16 DIAGNOSIS — Z7901 Long term (current) use of anticoagulants: Secondary | ICD-10-CM

## 2012-10-16 DIAGNOSIS — Z5181 Encounter for therapeutic drug level monitoring: Secondary | ICD-10-CM

## 2012-10-16 MED ORDER — ENOXAPARIN SODIUM 100 MG/ML ~~LOC~~ SOLN: 100.0000 mg | Freq: Two times a day (BID) | SUBCUTANEOUS | Status: DC

## 2012-10-16 NOTE — Telephone Encounter (Signed)
Pt notified of pt/inr results.  Since still low, will continue lovenox injections.  Also have her take coumadin 5mg  (1 1/2 tablet) - today and tomorrow and recheck pt/inr on Friday.  Further instructions to be given on Friday.

## 2012-10-18 ENCOUNTER — Other Ambulatory Visit (INDEPENDENT_AMBULATORY_CARE_PROVIDER_SITE_OTHER): Payer: Medicare Other

## 2012-10-18 DIAGNOSIS — Z7901 Long term (current) use of anticoagulants: Secondary | ICD-10-CM

## 2012-10-18 DIAGNOSIS — Z5181 Encounter for therapeutic drug level monitoring: Secondary | ICD-10-CM

## 2012-10-18 LAB — PROTIME-INR: INR: 2.3 ratio — ABNORMAL HIGH (ref 0.8–1.0)

## 2012-10-21 ENCOUNTER — Ambulatory Visit (INDEPENDENT_AMBULATORY_CARE_PROVIDER_SITE_OTHER): Payer: Medicare Other | Admitting: Internal Medicine

## 2012-10-21 ENCOUNTER — Other Ambulatory Visit: Payer: Self-pay | Admitting: Internal Medicine

## 2012-10-21 ENCOUNTER — Encounter: Payer: Self-pay | Admitting: Internal Medicine

## 2012-10-21 VITALS — BP 122/70 | HR 63 | Temp 98.3°F | Ht 64.0 in | Wt 212.2 lb

## 2012-10-21 DIAGNOSIS — Z7901 Long term (current) use of anticoagulants: Secondary | ICD-10-CM

## 2012-10-21 DIAGNOSIS — I82609 Acute embolism and thrombosis of unspecified veins of unspecified upper extremity: Secondary | ICD-10-CM

## 2012-10-21 DIAGNOSIS — J45909 Unspecified asthma, uncomplicated: Secondary | ICD-10-CM

## 2012-10-21 DIAGNOSIS — E7800 Pure hypercholesterolemia, unspecified: Secondary | ICD-10-CM

## 2012-10-21 DIAGNOSIS — Z5181 Encounter for therapeutic drug level monitoring: Secondary | ICD-10-CM

## 2012-10-21 DIAGNOSIS — Z23 Encounter for immunization: Secondary | ICD-10-CM

## 2012-10-21 DIAGNOSIS — I10 Essential (primary) hypertension: Secondary | ICD-10-CM

## 2012-10-21 DIAGNOSIS — I82602 Acute embolism and thrombosis of unspecified veins of left upper extremity: Secondary | ICD-10-CM

## 2012-10-21 DIAGNOSIS — Z9109 Other allergy status, other than to drugs and biological substances: Secondary | ICD-10-CM

## 2012-10-21 DIAGNOSIS — Z86718 Personal history of other venous thrombosis and embolism: Secondary | ICD-10-CM

## 2012-10-21 LAB — PROTIME-INR: Prothrombin Time: 24.8 s — ABNORMAL HIGH (ref 10.2–12.4)

## 2012-10-21 NOTE — Progress Notes (Signed)
Order placed for f/u pt/inr 

## 2012-10-24 ENCOUNTER — Telehealth: Payer: Self-pay | Admitting: Internal Medicine

## 2012-10-24 ENCOUNTER — Ambulatory Visit: Payer: Self-pay | Admitting: Internal Medicine

## 2012-10-24 ENCOUNTER — Other Ambulatory Visit (INDEPENDENT_AMBULATORY_CARE_PROVIDER_SITE_OTHER): Payer: Medicare Other

## 2012-10-24 ENCOUNTER — Encounter: Payer: Self-pay | Admitting: Internal Medicine

## 2012-10-24 DIAGNOSIS — Z5181 Encounter for therapeutic drug level monitoring: Secondary | ICD-10-CM

## 2012-10-24 DIAGNOSIS — Z7901 Long term (current) use of anticoagulants: Secondary | ICD-10-CM

## 2012-10-24 LAB — PROTIME-INR
INR: 2.8 ratio — ABNORMAL HIGH (ref 0.8–1.0)
Prothrombin Time: 28.8 s — ABNORMAL HIGH (ref 10.2–12.4)

## 2012-10-24 NOTE — Assessment & Plan Note (Signed)
Feels her breathing is stable.  Follow.   

## 2012-10-24 NOTE — Assessment & Plan Note (Signed)
Given history of multiple clots, will refer to hematology.

## 2012-10-24 NOTE — Assessment & Plan Note (Signed)
Uses Flonase.  Follow.   

## 2012-10-24 NOTE — Telephone Encounter (Signed)
Ultrasound report in your folder

## 2012-10-24 NOTE — Telephone Encounter (Signed)
Please call and see if we can get the results of her left arm ultrasound (ordered by Raquel 09/20/12).  No record of ultrasound in chart.  Thanks.

## 2012-10-24 NOTE — Assessment & Plan Note (Signed)
On coumadin.  Follow pt/inr.  Recheck today.  Given history of multiple clots, will refer to hematology for evaluation.

## 2012-10-24 NOTE — Telephone Encounter (Signed)
See below

## 2012-10-24 NOTE — Telephone Encounter (Signed)
Report requested

## 2012-10-24 NOTE — Assessment & Plan Note (Signed)
Blood pressure as outlined.  Same medication regimen.  Check metabolic panel.  Follow.

## 2012-10-24 NOTE — Assessment & Plan Note (Signed)
Off pravastatin.  Low cholesterol diet.   Remain off for now.  Follow.  Will schedule lipid panel.

## 2012-10-24 NOTE — Progress Notes (Signed)
Subjective:    [name], female    DOB: 08-Jan-1946, 67 y.o.   MRN: [id number]  HPI 67 year old female with past history of asthma, allergies/bronchitis, hypertension and hypercholesterolemia who comes in today for a scheduled follow up.  She is still having the back/rib pain, but improved.  See previous notes for details.  She is scheduled to see pain clinic next month.  May need MIRI.  She takes hydrocodone 1-2x/day (varies if she works).  She also recently was diagnosed with a left arm clot.  On coumadin now.  Levels have been varying.  Has been bridged with lovenox.  She reports that she has had previous blood clots in the past.  Has been on coumadin previously.  Discussed the possibility of long term anti coagulation.  Discussed hematology referral.  She is in agreement.  No sob.  No chest pain.  No nausea or vomiting.  Bowels stable.     Past Medical History  Diagnosis Date  . Asthma   . Allergy   . Hypercholesterolemia   . Hypertension   . Chicken pox   . History of blood clots     Current Outpatient Prescriptions on File Prior to Visit  Medication Sig Dispense Refill  . albuterol (PROVENTIL HFA) 108 (90 BASE) MCG/ACT inhaler Inhale 2 puffs into the lungs every 6 (six) hours as needed for wheezing.      . citalopram (CELEXA) 20 MG tablet Take 1 tablet (20 mg total) by mouth daily.  30 tablet  1  . co-enzyme Q-10 30 MG capsule Take 100 mg by mouth daily.      Marland Kitchen HYDROcodone-acetaminophen (NORCO/VICODIN) 5-325 MG per tablet Take 1 tablet by mouth every 8 (eight) hours as needed for pain.  30 tablet  0  . losartan (COZAAR) 50 MG tablet Take 1 tablet (50 mg total) by mouth daily.  30 tablet  5  . fluticasone (FLONASE) 50 MCG/ACT nasal spray Place 2 sprays into the nose daily.  16 g  1   No current facility-administered medications on file prior to visit.    Review of Systems Patient denies any headache.  No light headedness.  No sinus or allergy symptoms currently.  No  chest pain, tightness or palpitations.  No increased cough or congestion.  Persistent lower anterior rib pain/upper abdominal pain, side pain (bilateral) and back pain.  See above and previous notes for details.  No vomiting or nausea.  Taking the hydrocodone as outlined.  Planning to see pain clinic next month.  No bleeding.  On coumadin.       Objective:   Physical Exam  Filed Vitals:   10/21/12 1419  BP: 122/70  Pulse: 63  Temp: 98.3 F (59.86 C)   67 year old female in no acute distress. HEENT:  Nares- clear.  Oropharynx - without lesions. NECK:  Supple.  Nontender.  No audible bruit.  HEART:  Appears to be regular. LUNGS:  No crackles or wheezing audible.  Respirations even and unlabored.  RADIAL PULSE:  Equal bilaterally.   ABDOMEN:  Soft, nontender.  Bowel sounds present and normal.  No audible abdominal bruit.   EXTREMITIES:  No increased edema present.       Assessment & Plan:  CARDIOVASCULAR.  Recent ECHO with normal LV function.  No regional wall motion abnormalities, mild diastolic dysfunction.  Currently stable.  Follow.    ABDOMINAL/BACK PAIN.  CT of abdomen/pelvis did not reveal any acute abnormality.  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  See report for details.  On hydrocodone.  Has appt with pain clinic next month.   May need MRI.  Hopefully they can help help control pain with decrease use of hydrocodone.     HEALTH MAINTENANCE.  Physical 08/13/12.   Last colonoscopy (per her report) - 2012.  Recommended f/u (per pt) 10 years.  Mammogram scheduled previously.  Need results.

## 2012-10-25 ENCOUNTER — Other Ambulatory Visit: Payer: Self-pay | Admitting: Internal Medicine

## 2012-10-25 DIAGNOSIS — Z7901 Long term (current) use of anticoagulants: Secondary | ICD-10-CM

## 2012-10-25 NOTE — Progress Notes (Signed)
Order placed for f/u pt/inr 

## 2012-10-28 ENCOUNTER — Other Ambulatory Visit: Payer: Self-pay | Admitting: Internal Medicine

## 2012-10-28 MED ORDER — HYDROCODONE-ACETAMINOPHEN 5-325 MG PO TABS: 1.0000 | ORAL_TABLET | Freq: Three times a day (TID) | ORAL | Status: DC | PRN

## 2012-10-28 NOTE — Telephone Encounter (Signed)
rx written for hydrocodone #30 with no refills.  This should get her through until she sees pain clinic.

## 2012-10-28 NOTE — Telephone Encounter (Signed)
Ok to refill 

## 2012-10-29 ENCOUNTER — Other Ambulatory Visit (INDEPENDENT_AMBULATORY_CARE_PROVIDER_SITE_OTHER): Payer: Medicare Other

## 2012-10-29 DIAGNOSIS — Z7901 Long term (current) use of anticoagulants: Secondary | ICD-10-CM

## 2012-10-29 DIAGNOSIS — Z5181 Encounter for therapeutic drug level monitoring: Secondary | ICD-10-CM

## 2012-10-29 NOTE — Telephone Encounter (Signed)
Pt notified at time of pick up

## 2012-11-04 ENCOUNTER — Encounter: Payer: Self-pay | Admitting: Adult Health

## 2012-11-04 ENCOUNTER — Ambulatory Visit: Payer: Self-pay | Admitting: Internal Medicine

## 2012-11-04 LAB — CBC CANCER CENTER
Basophil #: 0.1 x10 3/mm (ref 0.0–0.1)
HCT: 42.8 % (ref 35.0–47.0)
MCH: 29.3 pg (ref 26.0–34.0)
MCHC: 33.6 g/dL (ref 32.0–36.0)
MCV: 87 fL (ref 80–100)
Monocyte #: 0.4 x10 3/mm (ref 0.2–0.9)
Monocyte %: 4.9 %
Neutrophil #: 4 x10 3/mm (ref 1.4–6.5)
Neutrophil %: 52.5 %
RDW: 13.3 % (ref 11.5–14.5)
WBC: 7.7 x10 3/mm (ref 3.6–11.0)

## 2012-11-04 LAB — HEPATIC FUNCTION PANEL A (ARMC)
Albumin: 4.1 g/dL (ref 3.4–5.0)
Bilirubin, Direct: 0.1 mg/dL (ref 0.00–0.20)
Bilirubin,Total: 0.7 mg/dL (ref 0.2–1.0)
SGOT(AST): 23 U/L (ref 15–37)
SGPT (ALT): 26 U/L (ref 12–78)
Total Protein: 7.9 g/dL (ref 6.4–8.2)

## 2012-11-05 ENCOUNTER — Other Ambulatory Visit (INDEPENDENT_AMBULATORY_CARE_PROVIDER_SITE_OTHER): Payer: Medicare Other

## 2012-11-05 DIAGNOSIS — Z7901 Long term (current) use of anticoagulants: Secondary | ICD-10-CM

## 2012-11-05 DIAGNOSIS — Z5181 Encounter for therapeutic drug level monitoring: Secondary | ICD-10-CM

## 2012-11-05 LAB — PROTIME-INR
INR: 1.8 ratio — ABNORMAL HIGH (ref 0.8–1.0)
Prothrombin Time: 18.8 s — ABNORMAL HIGH (ref 10.2–12.4)

## 2012-11-06 ENCOUNTER — Encounter: Payer: Self-pay | Admitting: *Deleted

## 2012-11-06 ENCOUNTER — Other Ambulatory Visit: Payer: Self-pay | Admitting: Internal Medicine

## 2012-11-06 DIAGNOSIS — Z7901 Long term (current) use of anticoagulants: Secondary | ICD-10-CM

## 2012-11-06 LAB — CANCER ANTIGEN 19-9: CA 19-9: 3 U/mL (ref 0–35)

## 2012-11-06 LAB — PROT IMMUNOELECTROPHORES(ARMC)

## 2012-11-06 NOTE — Progress Notes (Signed)
Order placed for f/u pt/inr 

## 2012-11-08 ENCOUNTER — Telehealth: Payer: Self-pay | Admitting: *Deleted

## 2012-11-08 NOTE — Telephone Encounter (Signed)
Updated med list with new Coumadin instructions

## 2012-11-14 ENCOUNTER — Other Ambulatory Visit (INDEPENDENT_AMBULATORY_CARE_PROVIDER_SITE_OTHER): Payer: Medicare Other

## 2012-11-14 ENCOUNTER — Telehealth: Payer: Self-pay | Admitting: *Deleted

## 2012-11-14 DIAGNOSIS — Z7901 Long term (current) use of anticoagulants: Secondary | ICD-10-CM

## 2012-11-14 LAB — PROTIME-INR
INR: 2.2 ratio — ABNORMAL HIGH (ref 0.8–1.0)
Prothrombin Time: 22.8 s — ABNORMAL HIGH (ref 10.2–12.4)

## 2012-11-14 NOTE — Telephone Encounter (Signed)
Noted.  Await INR results from today.

## 2012-11-14 NOTE — Telephone Encounter (Signed)
Pt states that her coumadin instruction are  5mg  on Monday, Wed, Friday and Sunday and 1/2 of 5mg  on Tues, Thursday and Saturday and was a little consider that she was taking a whole one back to back (Monday and Sunday) she has been taking like that but was double checking, I reviewed the notes and told her yes that she was taking it that right way  [facility]

## 2012-11-15 ENCOUNTER — Other Ambulatory Visit: Payer: Self-pay | Admitting: Internal Medicine

## 2012-11-15 DIAGNOSIS — Z7901 Long term (current) use of anticoagulants: Secondary | ICD-10-CM

## 2012-11-15 NOTE — Progress Notes (Signed)
Order placed for pt/inr 

## 2012-11-19 ENCOUNTER — Ambulatory Visit: Payer: Self-pay | Admitting: Anesthesiology

## 2012-11-21 ENCOUNTER — Other Ambulatory Visit (INDEPENDENT_AMBULATORY_CARE_PROVIDER_SITE_OTHER): Payer: Medicare Other

## 2012-11-21 ENCOUNTER — Other Ambulatory Visit: Payer: Self-pay | Admitting: Internal Medicine

## 2012-11-21 ENCOUNTER — Telehealth: Payer: Self-pay | Admitting: *Deleted

## 2012-11-21 DIAGNOSIS — Z7901 Long term (current) use of anticoagulants: Secondary | ICD-10-CM

## 2012-11-21 DIAGNOSIS — Z5181 Encounter for therapeutic drug level monitoring: Secondary | ICD-10-CM

## 2012-11-21 LAB — PROTIME-INR: INR: 2.4 ratio — ABNORMAL HIGH (ref 0.8–1.0)

## 2012-11-21 NOTE — Progress Notes (Signed)
Order placed for pt/inr 

## 2012-11-21 NOTE — Telephone Encounter (Signed)
Do you know what this pt can do to get her xrays.  Does she go to [facility] and get a disc of films.

## 2012-11-21 NOTE — Telephone Encounter (Signed)
Dr. Pernell Dupre at the [facility] would like for you to send the x-rays from her back to him

## 2012-11-22 NOTE — Telephone Encounter (Signed)
Spoke with pt and let her know she could go to [facility] and pick up those x-rays to take to Dr. Pernell Dupre

## 2012-11-22 NOTE — Telephone Encounter (Signed)
Left vm asking pt to return my call. I will advise her to call [facility] and ask them to prepare the disk for her and she can go there and pick up.

## 2012-11-30 ENCOUNTER — Ambulatory Visit: Payer: Self-pay | Admitting: Internal Medicine

## 2012-12-03 ENCOUNTER — Ambulatory Visit (INDEPENDENT_AMBULATORY_CARE_PROVIDER_SITE_OTHER): Payer: Medicare Other | Admitting: Internal Medicine

## 2012-12-03 ENCOUNTER — Encounter: Payer: Self-pay | Admitting: Internal Medicine

## 2012-12-03 VITALS — BP 120/70 | HR 69 | Temp 97.8°F | Ht 64.0 in | Wt 212.0 lb

## 2012-12-03 DIAGNOSIS — J45909 Unspecified asthma, uncomplicated: Secondary | ICD-10-CM

## 2012-12-03 DIAGNOSIS — I82602 Acute embolism and thrombosis of unspecified veins of left upper extremity: Secondary | ICD-10-CM

## 2012-12-03 DIAGNOSIS — M25559 Pain in unspecified hip: Secondary | ICD-10-CM

## 2012-12-03 DIAGNOSIS — I10 Essential (primary) hypertension: Secondary | ICD-10-CM

## 2012-12-03 DIAGNOSIS — M25552 Pain in left hip: Secondary | ICD-10-CM

## 2012-12-03 DIAGNOSIS — Z9109 Other allergy status, other than to drugs and biological substances: Secondary | ICD-10-CM

## 2012-12-03 DIAGNOSIS — Z7901 Long term (current) use of anticoagulants: Secondary | ICD-10-CM

## 2012-12-03 DIAGNOSIS — E7800 Pure hypercholesterolemia, unspecified: Secondary | ICD-10-CM

## 2012-12-03 DIAGNOSIS — I82609 Acute embolism and thrombosis of unspecified veins of unspecified upper extremity: Secondary | ICD-10-CM

## 2012-12-03 DIAGNOSIS — Z86718 Personal history of other venous thrombosis and embolism: Secondary | ICD-10-CM

## 2012-12-03 MED ORDER — HYDROCODONE-ACETAMINOPHEN 5-325 MG PO TABS: 1.0000 | ORAL_TABLET | Freq: Three times a day (TID) | ORAL | Status: DC | PRN

## 2012-12-03 NOTE — Progress Notes (Signed)
Pre-visit discussion using our clinic review tool. No additional management support is needed unless otherwise documented below in the visit note.  

## 2012-12-04 ENCOUNTER — Encounter: Payer: Self-pay | Admitting: Internal Medicine

## 2012-12-05 ENCOUNTER — Encounter: Payer: Self-pay | Admitting: Internal Medicine

## 2012-12-05 DIAGNOSIS — M25552 Pain in left hip: Secondary | ICD-10-CM | POA: Insufficient documentation

## 2012-12-05 NOTE — Assessment & Plan Note (Signed)
Blood pressure as outlined.  Same medication regimen.  Follow metabolic panel.  

## 2012-12-05 NOTE — Assessment & Plan Note (Signed)
Persistent.  Saw Dr Pernell Dupre for her back.  He recommended ortho referral.  Will schedule with [facility].

## 2012-12-05 NOTE — Assessment & Plan Note (Signed)
Off pravastatin.  Low cholesterol diet.  Pain improved some.  Will follow.

## 2012-12-05 NOTE — Assessment & Plan Note (Signed)
On coumadin.  Follow pt/inr.  Recheck today.  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  Will need to bridge with lovenox for upcoming epidural.

## 2012-12-05 NOTE — Assessment & Plan Note (Signed)
Uses Flonase.  Follow.   

## 2012-12-05 NOTE — Progress Notes (Signed)
Subjective:    [name], female    DOB: 1945/04/30, 67 y.o.   MRN: [id number]  HPI 67 year old female with past history of asthma, allergies/bronchitis, hypertension and hypercholesterolemia who comes in today for a scheduled follow up.  She is still having the back/rib pain.   See previous notes for details.  Being seen at pain clinic.  Planning for epidural injection next week.  She takes hydrocodone prn.  Informs me she was instructed to stop her coumadin five days before her injection.  She has been diagnosed with a left arm clot (recently).   On coumadin. She reports that she has had previous blood clots in the past. Saw Dr Sherrlyn Hock.  He recommended life long coumadin.  No sob.  No chest pain.  No nausea or vomiting.  Bowels stable.  She is also having pain in her left hip.  Dr Pernell Dupre had recommended an orthopedic referral.     Past Medical History  Diagnosis Date  . Asthma   . Allergy   . Hypercholesterolemia   . Hypertension   . Chicken pox   . History of blood clots     Current Outpatient Prescriptions on File Prior to Visit  Medication Sig Dispense Refill  . albuterol (PROVENTIL HFA) 108 (90 BASE) MCG/ACT inhaler Inhale 2 puffs into the lungs every 6 (six) hours as needed for wheezing.      . citalopram (CELEXA) 20 MG tablet Take 1 tablet (20 mg total) by mouth daily.  30 tablet  1  . losartan (COZAAR) 50 MG tablet Take 1 tablet (50 mg total) by mouth daily.  30 tablet  5  . warfarin (COUMADIN) 5 MG tablet Take 5 mg by mouth. Take 5mg  on Mon, Wed, Fri, & Sun and 2.5mg  on Tues, Thurs, & Sat.      . co-enzyme Q-10 30 MG capsule Take 100 mg by mouth daily.       No current facility-administered medications on file prior to visit.    Review of Systems Patient denies any headache.  No light headedness.  No sinus or allergy symptoms currently.  No chest pain, tightness or palpitations.  No increased cough or congestion.  Persistent lower anterior rib pain/upper abdominal  pain, side pain (bilateral) and back pain.  See above and previous notes for details.  No vomiting or nausea.  Taking the hydrocodone as outlined.  Seeing Dr Pernell Dupre.  Planning for epidural injection.  No bleeding.  On coumadin.  Left hip pain as outlined.       Objective:   Physical Exam  Filed Vitals:   12/03/12 1621  BP: 120/70  Pulse: 69  Temp: 97.8 F (48.72 C)   67 year old female in no acute distress. HEENT:  Nares- clear.  Oropharynx - without lesions. NECK:  Supple.  Nontender.  No audible bruit.  HEART:  Appears to be regular. LUNGS:  No crackles or wheezing audible.  Respirations even and unlabored.  RADIAL PULSE:  Equal bilaterally.   ABDOMEN:  Soft, nontender.  Bowel sounds present and normal.  No audible abdominal bruit.   EXTREMITIES:  No increased edema present.   MSK:  No pain with straight leg raise.  No significant pain with abduction and adduction - lower extremities.       Assessment & Plan:  CARDIOVASCULAR.  Recent ECHO with normal LV function.  No regional wall motion abnormalities, mild diastolic dysfunction.  Currently stable.  Follow.    ABDOMINAL/BACK PAIN.  CT of abdomen/pelvis did not reveal any acute abnormality.  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  See report for details.  On hydrocodone.  Saw Dr Pernell Dupre.  Scheduled for epidural injection next week.  Will need to bridge with lovenox.  Will discuss with Dr Sherrlyn Hock and Dr Pernell Dupre.      HEALTH MAINTENANCE.  Physical 08/13/12.   Last colonoscopy (per her report) - 2012.  Recommended f/u (per pt) 10 years.  Mammogram scheduled previously.  Still need results.

## 2012-12-05 NOTE — Assessment & Plan Note (Signed)
Saw Dr Sherrlyn Hock.  Recommended lifelong coumadin.  Will need to bridge with lovenox - for upcoming epidural.  Discuss with Dr Sherrlyn Hock and Dr Pernell Dupre.

## 2012-12-05 NOTE — Assessment & Plan Note (Signed)
Feels her breathing is stable.  Follow.   

## 2012-12-06 ENCOUNTER — Other Ambulatory Visit: Payer: Self-pay | Admitting: Internal Medicine

## 2012-12-06 ENCOUNTER — Telehealth: Payer: Self-pay | Admitting: Internal Medicine

## 2012-12-06 NOTE — Telephone Encounter (Signed)
Attempted to reach patient at home number-left a detailed message but would rather someone speak to her to be sure that she understands what she needs to do. I have also scheduled her lab appt & need to let her know the date & time.

## 2012-12-06 NOTE — Telephone Encounter (Signed)
Please notify pt that I spoke to Dr Pernell Dupre and Dr Sherrlyn Hock.  She is to hold her coumadin as Dr Pernell Dupre instructed.  Will need to bridge with lovenox injections as she did previously.  Need to know how many she has left and will call in prescription.  Will need to stay on the lovenox until 24 hours before the procedure and remain off 24 hours after.  Then will restart coumadin and continue lovenox injections until therapeutic on her coumadin.  Recheck pt/inr on Friday 12/13/12.

## 2012-12-06 NOTE — Telephone Encounter (Signed)
Lab appt scheduled for:12/13/12 (Fri) @ 10:15 AM

## 2012-12-06 NOTE — Progress Notes (Signed)
Called in lovenox - bid for 10 days (100mg ).

## 2012-12-07 NOTE — Telephone Encounter (Signed)
Late entry.  I spoke to pt 12/06/12 - discussed plan for coumadin and lovenox bridge.  She was notified to stop the coumadin.  Start lovenox injections.  Will need to stop the lovenox 24 hours before the procedure and remain off 24 hours after the procedure.  Dr Pernell Dupre will let her know when to restart coumadin.  Will then remain of her lovenox and coumadin until therapeutic on coumadin.

## 2012-12-10 ENCOUNTER — Telehealth: Payer: Self-pay | Admitting: Internal Medicine

## 2012-12-10 NOTE — Telephone Encounter (Signed)
If concern about infection - needs appt to be seen.

## 2012-12-10 NOTE — Telephone Encounter (Signed)
I haven't seen anything from [facility], but I know she is scheduled for surgery soon.

## 2012-12-10 NOTE — Telephone Encounter (Signed)
The patient was transferred to the triage nurse she wanted an appointment for a sinus infection.

## 2012-12-10 NOTE — Telephone Encounter (Signed)
Pt notified to contact her surgeons office to make sure that her sx's would not affect her surgery tomorrow.

## 2012-12-11 ENCOUNTER — Ambulatory Visit: Payer: Self-pay | Admitting: Anesthesiology

## 2012-12-13 ENCOUNTER — Other Ambulatory Visit (INDEPENDENT_AMBULATORY_CARE_PROVIDER_SITE_OTHER): Payer: Medicare Other

## 2012-12-13 DIAGNOSIS — Z86718 Personal history of other venous thrombosis and embolism: Secondary | ICD-10-CM

## 2012-12-13 LAB — PROTIME-INR
INR: 1.2 ratio — ABNORMAL HIGH (ref 0.8–1.0)
Prothrombin Time: 12.2 s (ref 10.2–12.4)

## 2012-12-16 ENCOUNTER — Other Ambulatory Visit (INDEPENDENT_AMBULATORY_CARE_PROVIDER_SITE_OTHER): Payer: Medicare Other

## 2012-12-16 DIAGNOSIS — Z86718 Personal history of other venous thrombosis and embolism: Secondary | ICD-10-CM

## 2012-12-16 LAB — PROTIME-INR: Prothrombin Time: 17.5 s — ABNORMAL HIGH (ref 10.2–12.4)

## 2012-12-17 ENCOUNTER — Other Ambulatory Visit: Payer: Self-pay | Admitting: Internal Medicine

## 2012-12-17 ENCOUNTER — Encounter: Payer: Self-pay | Admitting: *Deleted

## 2012-12-17 DIAGNOSIS — Z7901 Long term (current) use of anticoagulants: Secondary | ICD-10-CM

## 2012-12-17 NOTE — Progress Notes (Signed)
Order placed for f/u pt/inr 

## 2012-12-18 ENCOUNTER — Other Ambulatory Visit (INDEPENDENT_AMBULATORY_CARE_PROVIDER_SITE_OTHER): Payer: Medicare Other

## 2012-12-18 ENCOUNTER — Other Ambulatory Visit: Payer: Self-pay | Admitting: Internal Medicine

## 2012-12-18 DIAGNOSIS — Z5181 Encounter for therapeutic drug level monitoring: Secondary | ICD-10-CM

## 2012-12-18 DIAGNOSIS — I82602 Acute embolism and thrombosis of unspecified veins of left upper extremity: Secondary | ICD-10-CM

## 2012-12-18 DIAGNOSIS — Z86718 Personal history of other venous thrombosis and embolism: Secondary | ICD-10-CM

## 2012-12-18 DIAGNOSIS — Z7901 Long term (current) use of anticoagulants: Secondary | ICD-10-CM

## 2012-12-18 LAB — PROTIME-INR: INR: 1.9 ratio — ABNORMAL HIGH (ref 0.8–1.0)

## 2012-12-18 NOTE — Progress Notes (Signed)
Order placed for f/u pt/inr 

## 2012-12-20 ENCOUNTER — Other Ambulatory Visit (INDEPENDENT_AMBULATORY_CARE_PROVIDER_SITE_OTHER): Payer: Medicare Other

## 2012-12-20 DIAGNOSIS — Z5181 Encounter for therapeutic drug level monitoring: Secondary | ICD-10-CM

## 2012-12-20 DIAGNOSIS — Z7901 Long term (current) use of anticoagulants: Secondary | ICD-10-CM

## 2012-12-20 LAB — PROTIME-INR: INR: 1.8 ratio — ABNORMAL HIGH (ref 0.8–1.0)

## 2012-12-25 ENCOUNTER — Encounter: Payer: Self-pay | Admitting: *Deleted

## 2012-12-25 ENCOUNTER — Other Ambulatory Visit (INDEPENDENT_AMBULATORY_CARE_PROVIDER_SITE_OTHER): Payer: Medicare Other

## 2012-12-25 DIAGNOSIS — Z7901 Long term (current) use of anticoagulants: Secondary | ICD-10-CM

## 2012-12-25 DIAGNOSIS — Z5181 Encounter for therapeutic drug level monitoring: Secondary | ICD-10-CM

## 2012-12-25 LAB — PROTIME-INR
INR: 2.2 ratio — ABNORMAL HIGH (ref 0.8–1.0)
Prothrombin Time: 23.1 s — ABNORMAL HIGH (ref 10.2–12.4)

## 2013-01-01 ENCOUNTER — Other Ambulatory Visit (INDEPENDENT_AMBULATORY_CARE_PROVIDER_SITE_OTHER): Payer: Medicare Other

## 2013-01-01 DIAGNOSIS — Z5181 Encounter for therapeutic drug level monitoring: Secondary | ICD-10-CM

## 2013-01-01 LAB — PROTIME-INR: INR: 2.6 ratio — ABNORMAL HIGH (ref 0.8–1.0)

## 2013-01-06 ENCOUNTER — Telehealth: Payer: Self-pay | Admitting: Internal Medicine

## 2013-01-06 ENCOUNTER — Other Ambulatory Visit (INDEPENDENT_AMBULATORY_CARE_PROVIDER_SITE_OTHER): Payer: Medicare Other

## 2013-01-06 DIAGNOSIS — Z5181 Encounter for therapeutic drug level monitoring: Secondary | ICD-10-CM

## 2013-01-06 LAB — PROTIME-INR: Prothrombin Time: 27.2 s — ABNORMAL HIGH (ref 10.2–12.4)

## 2013-01-06 NOTE — Telephone Encounter (Signed)
FYI: pt was in today also for PT/INR check

## 2013-01-06 NOTE — Telephone Encounter (Signed)
Calling to inform she will have a shot (no name given) next Monday.  Was told by pain doctor that she will need to stop Coumadin 5 days before.  Pt calling to inform Dr. Lorin Picket.

## 2013-01-07 ENCOUNTER — Other Ambulatory Visit: Payer: Self-pay | Admitting: *Deleted

## 2013-01-07 ENCOUNTER — Encounter: Payer: Self-pay | Admitting: *Deleted

## 2013-01-07 MED ORDER — ENOXAPARIN SODIUM 100 MG/ML ~~LOC~~ SOLN: SUBCUTANEOUS | Status: DC

## 2013-01-07 NOTE — Telephone Encounter (Signed)
Lovenox also sent to [facility]

## 2013-01-07 NOTE — Telephone Encounter (Signed)
Pt had INR 01/06/13.  Level ok.  According to her phone message, she is planning to have a procedure 01/13/13.  She will need to stop her coumadin after Tuesdays (01/07/13) dose.  She will need to start lovenox injections (just as she did before) on Wednesday 01/08/13.  She will continue Lovenox until 24 hours before her procedure.  She will need to stop lovenox 24 hours before her procedure and remain off for 24 hours after procedure.  Will restart her coumadin when Dr Pernell Dupre tells her she can restart.  Will then need to remain on coumadin and lovenox until therapeutic on coumadin.  Please call in a refill on her lovenox (can call in 20 doses).  Any questions of confusion about these instructions, let me know.

## 2013-01-07 NOTE — Telephone Encounter (Signed)
Pt notified of labs, directions prior to & after surgery. And pt also aware that I am sending instructions to her mychart.

## 2013-01-13 ENCOUNTER — Ambulatory Visit: Payer: Self-pay | Admitting: Anesthesiology

## 2013-01-14 ENCOUNTER — Ambulatory Visit (INDEPENDENT_AMBULATORY_CARE_PROVIDER_SITE_OTHER): Payer: Medicare Other | Admitting: Internal Medicine

## 2013-01-14 ENCOUNTER — Encounter: Payer: Self-pay | Admitting: Internal Medicine

## 2013-01-14 ENCOUNTER — Other Ambulatory Visit: Payer: Medicare Other

## 2013-01-14 VITALS — BP 130/80 | HR 78 | Temp 97.7°F | Ht 64.0 in | Wt 209.0 lb

## 2013-01-14 DIAGNOSIS — Z5181 Encounter for therapeutic drug level monitoring: Secondary | ICD-10-CM

## 2013-01-14 DIAGNOSIS — M25559 Pain in unspecified hip: Secondary | ICD-10-CM

## 2013-01-14 DIAGNOSIS — I82609 Acute embolism and thrombosis of unspecified veins of unspecified upper extremity: Secondary | ICD-10-CM

## 2013-01-14 DIAGNOSIS — I82602 Acute embolism and thrombosis of unspecified veins of left upper extremity: Secondary | ICD-10-CM

## 2013-01-14 DIAGNOSIS — M25552 Pain in left hip: Secondary | ICD-10-CM

## 2013-01-14 DIAGNOSIS — Z7901 Long term (current) use of anticoagulants: Secondary | ICD-10-CM

## 2013-01-14 LAB — PROTIME-INR
INR: 1.1 ratio — ABNORMAL HIGH (ref 0.8–1.0)
Prothrombin Time: 11.2 s (ref 10.2–12.4)

## 2013-01-14 LAB — CBC WITH DIFFERENTIAL/PLATELET
Eosinophils Relative: 0.4 % (ref 0.0–5.0)
HCT: 41.7 % (ref 36.0–46.0)
Hemoglobin: 13.8 g/dL (ref 12.0–15.0)
Lymphocytes Relative: 22.5 % (ref 12.0–46.0)
Lymphs Abs: 2.3 10*3/uL (ref 0.7–4.0)
Monocytes Relative: 5 % (ref 3.0–12.0)
Neutro Abs: 7.2 10*3/uL (ref 1.4–7.7)
WBC: 10.1 10*3/uL (ref 4.5–10.5)

## 2013-01-14 MED ORDER — PANTOPRAZOLE SODIUM 40 MG PO TBEC: 40.0000 mg | DELAYED_RELEASE_TABLET | Freq: Every day | ORAL | Status: DC

## 2013-01-14 NOTE — Progress Notes (Signed)
Pre-visit discussion using our clinic review tool. No additional management support is needed unless otherwise documented below in the visit note.  

## 2013-01-17 ENCOUNTER — Other Ambulatory Visit (INDEPENDENT_AMBULATORY_CARE_PROVIDER_SITE_OTHER): Payer: Medicare Other

## 2013-01-17 DIAGNOSIS — Z5181 Encounter for therapeutic drug level monitoring: Secondary | ICD-10-CM

## 2013-01-17 LAB — PROTIME-INR
INR: 1.7 ratio — ABNORMAL HIGH (ref 0.8–1.0)
Prothrombin Time: 17.4 s — ABNORMAL HIGH (ref 10.2–12.4)

## 2013-01-19 ENCOUNTER — Encounter: Payer: Self-pay | Admitting: Internal Medicine

## 2013-01-19 NOTE — Assessment & Plan Note (Signed)
Persistent.  Saw Dr Pernell Dupre for her back.  He recommended ortho referral.  She had declined previously.  States she does plan to f/u regarding her hip.

## 2013-01-19 NOTE — Progress Notes (Signed)
  Subjective:    [name], female    DOB: 11-25-1945, 67 y.o.   MRN: [id number]  HPI 67 year old female with past history of asthma, allergies/bronchitis, hypertension and hypercholesterolemia who comes in today as a work in with concerns regarding a "blister" on her stomach - where she gives her lovenox injections.  She is still having the back/rib pain.   See previous notes for details.  Being seen at pain clinic.  Had epidural injection yesterday.  Is having to use Lovenox injections to bridge - for the injection.  Off her coumadin now.  Planning to restart tonight.  Noticed a blister on her lower abdomen.  Better now than compared to yesterday.  No pain.        Past Medical History  Diagnosis Date  . Asthma   . Allergy   . Hypercholesterolemia   . Hypertension   . Chicken pox   . History of blood clots     Current Outpatient Prescriptions on File Prior to Visit  Medication Sig Dispense Refill  . albuterol (PROVENTIL HFA) 108 (90 BASE) MCG/ACT inhaler Inhale 2 puffs into the lungs every 6 (six) hours as needed for wheezing.      . citalopram (CELEXA) 20 MG tablet Take 1 tablet (20 mg total) by mouth daily.  30 tablet  1  . co-enzyme Q-10 30 MG capsule Take 100 mg by mouth daily.      Marland Kitchen enoxaparin (LOVENOX) 100 MG/ML injection Use as directed  20 Syringe  0  . losartan (COZAAR) 50 MG tablet Take 1 tablet (50 mg total) by mouth daily.  30 tablet  5  . HYDROcodone-acetaminophen (NORCO/VICODIN) 5-325 MG per tablet Take 1 tablet by mouth every 8 (eight) hours as needed.  30 tablet  0  . warfarin (COUMADIN) 5 MG tablet Take 5 mg by mouth. Take 5mg  on Mon, Wed, Fri, & Sun and 2.5mg  on Tues, Thurs, & Sat.       No current facility-administered medications on file prior to visit.    Review of Systems Seeing Dr Pernell Dupre.  Had epidural injection yesterday.  Using lovenox to bridge.  Will restart her coumadin tonight.  Noticed a "blister" on her lower abdomen.  This is localized to the  lovenox injection site.  No other bleeding.  Back is doing better.  Still some rib pain.  Planning to f/u regarding her hip as well.        Objective:   Physical Exam  Filed Vitals:   01/14/13 1030  BP: 130/80  Pulse: 78  Temp: 97.7 F (63.41 C)   67 year old female in no acute distress. HEART:  Appears to be regular. LUNGS:  No crackles or wheezing audible.  Respirations even and unlabored.  ABDOMEN:  Soft, nontender.  Small circular raised lesion (blood filled).  (per pt decreased size).  Non tender.       Assessment & Plan:  SKIN LESION.  Raised.  Blood filled.  Non tender.  Improving.  Instructed to rotate her injection sites.  Lesion already improved.  Should continue to reabsorb.  Follow.    HEALTH MAINTENANCE.  Physical 08/13/12.   Last colonoscopy (per her report) - 2012.  Recommended f/u (per pt) 10 years.  Mammogram scheduled previously.  Still need results.

## 2013-01-19 NOTE — Assessment & Plan Note (Signed)
On Lovenox - to bridge between injections.  Planning to restart her coumadin tonight.  Follow PT/INR.

## 2013-01-20 ENCOUNTER — Other Ambulatory Visit (INDEPENDENT_AMBULATORY_CARE_PROVIDER_SITE_OTHER): Payer: Medicare Other

## 2013-01-20 DIAGNOSIS — Z7901 Long term (current) use of anticoagulants: Secondary | ICD-10-CM

## 2013-01-20 DIAGNOSIS — Z5181 Encounter for therapeutic drug level monitoring: Secondary | ICD-10-CM

## 2013-01-20 LAB — PROTIME-INR
INR: 2.7 ratio — ABNORMAL HIGH (ref 0.8–1.0)
Prothrombin Time: 27.6 s — ABNORMAL HIGH (ref 10.2–12.4)

## 2013-01-22 ENCOUNTER — Other Ambulatory Visit: Payer: Medicare Other

## 2013-01-27 ENCOUNTER — Other Ambulatory Visit (INDEPENDENT_AMBULATORY_CARE_PROVIDER_SITE_OTHER): Payer: Medicare Other

## 2013-01-27 DIAGNOSIS — Z7901 Long term (current) use of anticoagulants: Secondary | ICD-10-CM

## 2013-01-27 DIAGNOSIS — Z5181 Encounter for therapeutic drug level monitoring: Secondary | ICD-10-CM

## 2013-01-27 LAB — PROTIME-INR: Prothrombin Time: 40.8 s — ABNORMAL HIGH (ref 10.2–12.4)

## 2013-01-28 ENCOUNTER — Other Ambulatory Visit (INDEPENDENT_AMBULATORY_CARE_PROVIDER_SITE_OTHER): Payer: Medicare Other

## 2013-01-28 ENCOUNTER — Encounter: Payer: Self-pay | Admitting: Internal Medicine

## 2013-01-28 ENCOUNTER — Encounter: Payer: Self-pay | Admitting: *Deleted

## 2013-01-28 DIAGNOSIS — Z5181 Encounter for therapeutic drug level monitoring: Secondary | ICD-10-CM

## 2013-01-28 LAB — PROTIME-INR: INR: 3.8 ratio — ABNORMAL HIGH (ref 0.8–1.0)

## 2013-01-29 NOTE — Telephone Encounter (Signed)
See phone note

## 2013-01-31 ENCOUNTER — Other Ambulatory Visit (INDEPENDENT_AMBULATORY_CARE_PROVIDER_SITE_OTHER): Payer: Medicare Other

## 2013-01-31 DIAGNOSIS — Z5181 Encounter for therapeutic drug level monitoring: Secondary | ICD-10-CM

## 2013-01-31 LAB — PROTIME-INR
INR: 1.6 ratio — AB (ref 0.8–1.0)
Prothrombin Time: 16.8 s — ABNORMAL HIGH (ref 10.2–12.4)

## 2013-02-04 ENCOUNTER — Ambulatory Visit: Payer: Self-pay | Admitting: Internal Medicine

## 2013-02-04 ENCOUNTER — Ambulatory Visit (INDEPENDENT_AMBULATORY_CARE_PROVIDER_SITE_OTHER): Payer: Medicare Other | Admitting: Internal Medicine

## 2013-02-04 ENCOUNTER — Encounter: Payer: Self-pay | Admitting: Internal Medicine

## 2013-02-04 VITALS — BP 110/80 | HR 109 | Temp 97.5°F | Ht 64.0 in | Wt 205.8 lb

## 2013-02-04 DIAGNOSIS — Z86718 Personal history of other venous thrombosis and embolism: Secondary | ICD-10-CM

## 2013-02-04 DIAGNOSIS — R51 Headache: Secondary | ICD-10-CM

## 2013-02-04 DIAGNOSIS — M25552 Pain in left hip: Secondary | ICD-10-CM

## 2013-02-04 DIAGNOSIS — I82609 Acute embolism and thrombosis of unspecified veins of unspecified upper extremity: Secondary | ICD-10-CM

## 2013-02-04 DIAGNOSIS — I82602 Acute embolism and thrombosis of unspecified veins of left upper extremity: Secondary | ICD-10-CM

## 2013-02-04 DIAGNOSIS — R0781 Pleurodynia: Secondary | ICD-10-CM

## 2013-02-04 DIAGNOSIS — M25559 Pain in unspecified hip: Secondary | ICD-10-CM

## 2013-02-04 DIAGNOSIS — Z9109 Other allergy status, other than to drugs and biological substances: Secondary | ICD-10-CM

## 2013-02-04 DIAGNOSIS — I10 Essential (primary) hypertension: Secondary | ICD-10-CM

## 2013-02-04 DIAGNOSIS — J45909 Unspecified asthma, uncomplicated: Secondary | ICD-10-CM

## 2013-02-04 DIAGNOSIS — R079 Chest pain, unspecified: Secondary | ICD-10-CM

## 2013-02-04 DIAGNOSIS — Z5181 Encounter for therapeutic drug level monitoring: Secondary | ICD-10-CM

## 2013-02-04 DIAGNOSIS — Z7901 Long term (current) use of anticoagulants: Secondary | ICD-10-CM

## 2013-02-04 DIAGNOSIS — E7800 Pure hypercholesterolemia, unspecified: Secondary | ICD-10-CM

## 2013-02-04 MED ORDER — CITALOPRAM HYDROBROMIDE 20 MG PO TABS: 20.0000 mg | ORAL_TABLET | Freq: Every day | ORAL | Status: DC

## 2013-02-04 NOTE — Progress Notes (Signed)
Pre-visit discussion using our clinic review tool. No additional management support is needed unless otherwise documented below in the visit note.  

## 2013-02-05 ENCOUNTER — Other Ambulatory Visit: Payer: Self-pay | Admitting: Internal Medicine

## 2013-02-05 ENCOUNTER — Encounter: Payer: Self-pay | Admitting: *Deleted

## 2013-02-05 DIAGNOSIS — N289 Disorder of kidney and ureter, unspecified: Secondary | ICD-10-CM

## 2013-02-05 DIAGNOSIS — I82602 Acute embolism and thrombosis of unspecified veins of left upper extremity: Secondary | ICD-10-CM

## 2013-02-05 LAB — COMPREHENSIVE METABOLIC PANEL
ALT: 17 U/L (ref 0–35)
AST: 17 U/L (ref 0–37)
Albumin: 4.3 g/dL (ref 3.5–5.2)
Alkaline Phosphatase: 51 U/L (ref 39–117)
BILIRUBIN TOTAL: 1.2 mg/dL (ref 0.3–1.2)
BUN: 21 mg/dL (ref 6–23)
CHLORIDE: 106 meq/L (ref 96–112)
CO2: 26 mEq/L (ref 19–32)
CREATININE: 1.4 mg/dL — AB (ref 0.4–1.2)
Calcium: 9.2 mg/dL (ref 8.4–10.5)
GFR: 39.92 mL/min — ABNORMAL LOW (ref >60.00)
Glucose, Bld: 88 mg/dL (ref 70–99)
Potassium: 3.9 mEq/L (ref 3.5–5.1)
Sodium: 139 mEq/L (ref 135–145)
Total Protein: 7.3 g/dL (ref 6.0–8.3)

## 2013-02-05 LAB — PROTIME-INR
INR: 1.8 ratio — AB (ref 0.8–1.0)
Prothrombin Time: 19 s — ABNORMAL HIGH (ref 10.2–12.4)

## 2013-02-05 LAB — SEDIMENTATION RATE: Sed Rate: 21 mm/hr (ref 0–22)

## 2013-02-05 NOTE — Progress Notes (Signed)
Orders placed for f/u labs.  

## 2013-02-09 ENCOUNTER — Encounter: Payer: Self-pay | Admitting: Internal Medicine

## 2013-02-09 NOTE — Assessment & Plan Note (Signed)
Persistent.  Saw Dr Andree Elk for her back.  He recommended ortho referral.  She had declined previously.  Hip better.  Now with increased pain in the hip.  Will check xray.  Needs referral back to ortho.  Has hydrocodone if needed.  Avoid antiinflammatories on coumadin.

## 2013-02-09 NOTE — Assessment & Plan Note (Signed)
Uses Flonase.  Follow.   

## 2013-02-09 NOTE — Progress Notes (Signed)
Subjective:    [name], female    DOB: 1945-02-27, 68 y.o.   MRN: [id number]  HPI 68 year old female with past history of asthma, allergies/bronchitis, hypertension and hypercholesterolemia who comes in today for a scheduled follow up.  She is still having the back/rib pain.   See previous notes for details.  Being seen at pain clinic.  Is s/p epidural injections x 2.  Planning for a third soon.  Discussed with her today regarding postponing temporarily, since back is better.    She takes hydrocodone prn.    On coumadin. She has had previous blood clots in the past.  Saw Dr Ma Hillock.  He recommended life long coumadin.  No sob.  No chest pain.  No nausea or vomiting.  Bowels stable.  She is also having pain in her left hip.  Dr Andree Elk had recommended an orthopedic referral.  Hip improved.  Now with worsening pain again.  Increased pain with weight bearing.  Still working - cleaning houses.  Increased stress with family issues.  Feels she is handling things relatively well.  Does not feel she needs any further intervention at this time.     Past Medical History  Diagnosis Date  . Asthma   . Allergy   . Hypercholesterolemia   . Hypertension   . Chicken pox   . History of blood clots     Current Outpatient Prescriptions on File Prior to Visit  Medication Sig Dispense Refill  . albuterol (PROVENTIL HFA) 108 (90 BASE) MCG/ACT inhaler Inhale 2 puffs into the lungs every 6 (six) hours as needed for wheezing.      Marland Kitchen co-enzyme Q-10 30 MG capsule Take 100 mg by mouth daily.      Marland Kitchen enoxaparin (LOVENOX) 100 MG/ML injection Use as directed  20 Syringe  0  . HYDROcodone-acetaminophen (NORCO/VICODIN) 5-325 MG per tablet Take 1 tablet by mouth every 8 (eight) hours as needed.  30 tablet  0  . losartan (COZAAR) 50 MG tablet Take 1 tablet (50 mg total) by mouth daily.  30 tablet  5  . pantoprazole (PROTONIX) 40 MG tablet Take 1 tablet (40 mg total) by mouth daily.  30 tablet  2  . warfarin  (COUMADIN) 5 MG tablet Take 5 mg by mouth. Take 4m on Mon, Wed, Fri, & Sun and 2.518mon Tues, Thurs, & Sat.       No current facility-administered medications on file prior to visit.    Review of Systems some headache.   No light headedness.  No sinus or allergy symptoms currently.  No chest pain, tightness or palpitations.  No increased cough or congestion.  Persistent lower anterior rib pain/upper abdominal pain, side pain (bilateral) and back pain.  See above and previous notes for details.  This is better s/p injections.  No vomiting or nausea.  Taking the hydrocodone as outlined.  Seeing Dr AdAndree Elk  No bleeding.  On coumadin.  Left hip pain as outlined.       Objective:   Physical Exam  Filed Vitals:   02/04/13 1502  BP: 110/80  Pulse: 109  Temp: 97.5 F (3675 48)   [age]ear old female in no acute distress. HEENT:  Nares- clear.  Oropharynx - without lesions. NECK:  Supple.  Nontender.  No audible bruit.  HEART:  Appears to be regular. LUNGS:  No crackles or wheezing audible.  Respirations even and unlabored.  RADIAL PULSE:  Equal bilaterally.   ABDOMEN:  Soft, nontender.  Bowel sounds present and normal.  No audible abdominal bruit.   EXTREMITIES:  No increased edema present.   MSK:  No pain with straight leg raise.  No significant pain with abduction and adduction - lower extremities.  Increased pain with weight bearing.       Assessment & Plan:  HEADACHE.  Reported headache recently.  Persistent/intermittent.  Discussed further w/up.  She wants to hold on scanning.  Has been under increased stress.   Does not feel she needs anything more for the stress.  Check ESR.  Will notify me or be reevaluated if symptoms persist or worsen.    CARDIOVASCULAR.  Recent ECHO with normal LV function.  No regional wall motion abnormalities, mild diastolic dysfunction.  Currently stable.  Follow.    ABDOMINAL/BACK PAIN.  CT of abdomen/pelvis did not reveal any acute abnormality.  Did have  spinal canal narrowing in L2-L3.  See report for details.  On hydrocodone.  Saw Dr Andree Elk.  S/p epidural injections x 2.  Better.  Follow.  Will let me know when third planned.  May try to postpone.       HEALTH MAINTENANCE.  Physical 08/13/12.   Last colonoscopy (per her report) - 2012.  Recommended f/u (per pt) 10 years.  Mammogram scheduled previously.  Need to reschedule.

## 2013-02-09 NOTE — Assessment & Plan Note (Signed)
Blood pressure as outlined.  Same medication regimen.  Follow metabolic panel.  

## 2013-02-09 NOTE — Assessment & Plan Note (Signed)
On coumadin.  Follow pt/inr.  Recheck today.  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  Will notify me when ready for third injection.

## 2013-02-09 NOTE — Assessment & Plan Note (Signed)
Feels her breathing is stable.  Follow.

## 2013-02-09 NOTE — Assessment & Plan Note (Signed)
Off pravastatin.  Low cholesterol diet.  Will follow.   

## 2013-02-09 NOTE — Assessment & Plan Note (Signed)
Saw Dr Ma Hillock.  Recommended lifelong coumadin.

## 2013-02-09 NOTE — Assessment & Plan Note (Signed)
Better s/p injections.  Follow.   

## 2013-02-10 ENCOUNTER — Other Ambulatory Visit (INDEPENDENT_AMBULATORY_CARE_PROVIDER_SITE_OTHER): Payer: Medicare Other

## 2013-02-10 DIAGNOSIS — R8281 Pyuria: Secondary | ICD-10-CM

## 2013-02-10 DIAGNOSIS — I82602 Acute embolism and thrombosis of unspecified veins of left upper extremity: Secondary | ICD-10-CM

## 2013-02-10 DIAGNOSIS — I82609 Acute embolism and thrombosis of unspecified veins of unspecified upper extremity: Secondary | ICD-10-CM

## 2013-02-10 DIAGNOSIS — N289 Disorder of kidney and ureter, unspecified: Secondary | ICD-10-CM

## 2013-02-10 LAB — URINALYSIS, ROUTINE W REFLEX MICROSCOPIC
BILIRUBIN URINE: NEGATIVE
KETONES UR: NEGATIVE
Nitrite: POSITIVE — AB
Specific Gravity, Urine: 1.01 (ref 1.000–1.030)
Total Protein, Urine: NEGATIVE
Urine Glucose: NEGATIVE
Urobilinogen, UA: 0.2 (ref 0.0–1.0)
pH: 5.5 (ref 5.0–8.0)

## 2013-02-10 LAB — PROTIME-INR
INR: 2.1 ratio — AB (ref 0.8–1.0)
Prothrombin Time: 22.2 s — ABNORMAL HIGH (ref 10.2–12.4)

## 2013-02-10 LAB — BASIC METABOLIC PANEL
BUN: 12 mg/dL (ref 6–23)
CO2: 28 mEq/L (ref 19–32)
Calcium: 9.3 mg/dL (ref 8.4–10.5)
Chloride: 109 mEq/L (ref 96–112)
Creatinine, Ser: 1.1 mg/dL (ref 0.4–1.2)
GFR: 55.17 mL/min — AB (ref >60.00)
GLUCOSE: 82 mg/dL (ref 70–99)
POTASSIUM: 3.9 meq/L (ref 3.5–5.1)
Sodium: 143 mEq/L (ref 135–145)

## 2013-02-11 ENCOUNTER — Other Ambulatory Visit: Payer: Self-pay | Admitting: Internal Medicine

## 2013-02-11 DIAGNOSIS — R8281 Pyuria: Secondary | ICD-10-CM

## 2013-02-11 NOTE — Progress Notes (Signed)
Order placed for urine culture 

## 2013-02-11 NOTE — Addendum Note (Signed)
Addended by: Karlene Einstein D on: 02/11/2013 08:22 AM   Modules accepted: Orders

## 2013-02-13 LAB — CULTURE, URINE COMPREHENSIVE

## 2013-02-14 ENCOUNTER — Other Ambulatory Visit: Payer: Self-pay | Admitting: Internal Medicine

## 2013-02-14 ENCOUNTER — Encounter: Payer: Self-pay | Admitting: *Deleted

## 2013-02-14 DIAGNOSIS — Z7901 Long term (current) use of anticoagulants: Secondary | ICD-10-CM

## 2013-02-14 NOTE — Progress Notes (Signed)
Order placed for f/u pt/inr 

## 2013-02-17 ENCOUNTER — Other Ambulatory Visit: Payer: Self-pay | Admitting: *Deleted

## 2013-02-17 MED ORDER — CEFUROXIME AXETIL 250 MG PO TABS: 250.0000 mg | ORAL_TABLET | Freq: Two times a day (BID) | ORAL | Status: DC

## 2013-02-18 ENCOUNTER — Other Ambulatory Visit (INDEPENDENT_AMBULATORY_CARE_PROVIDER_SITE_OTHER): Payer: Medicare Other

## 2013-02-18 ENCOUNTER — Telehealth: Payer: Self-pay | Admitting: *Deleted

## 2013-02-18 ENCOUNTER — Other Ambulatory Visit: Payer: Self-pay | Admitting: *Deleted

## 2013-02-18 DIAGNOSIS — Z5181 Encounter for therapeutic drug level monitoring: Secondary | ICD-10-CM

## 2013-02-18 DIAGNOSIS — Z7901 Long term (current) use of anticoagulants: Secondary | ICD-10-CM

## 2013-02-18 LAB — PROTIME-INR
INR: 2.7 ratio — ABNORMAL HIGH (ref 0.8–1.0)
PROTHROMBIN TIME: 27.6 s — AB (ref 10.2–12.4)

## 2013-02-18 MED ORDER — WARFARIN SODIUM 5 MG PO TABS: 5.0000 mg | ORAL_TABLET | Freq: Every day | ORAL | Status: DC

## 2013-02-18 NOTE — Telephone Encounter (Signed)
If she is ok with extending out the procedure for a while - I am ok with this.  She will need to let me know in advance when she is scheduled so we can adjust her medications.

## 2013-02-18 NOTE — Telephone Encounter (Signed)
Pt came in for labs this morning & wanted to let you know that she is going to call Dr. Andree Elk office to reschedule her appt/test because she was suppose to d/c her Coumadin 5 days prior & she thought that you were going to contact them first & decide when she should have the testing done. I told her to move it out for a month instead of cancelling & that we would let her know if it needs to be changed again.

## 2013-02-19 ENCOUNTER — Other Ambulatory Visit: Payer: Self-pay | Admitting: *Deleted

## 2013-02-19 ENCOUNTER — Encounter: Payer: Self-pay | Admitting: *Deleted

## 2013-02-19 MED ORDER — ENOXAPARIN SODIUM 100 MG/ML ~~LOC~~ SOLN: SUBCUTANEOUS | Status: DC

## 2013-02-19 NOTE — Telephone Encounter (Signed)
Sent mychart message so that she would have written instructions

## 2013-02-19 NOTE — Telephone Encounter (Signed)
She will need to stop the coumadin as advised by Dr Andree Elk (they should tell her how many days prior to procedure to stop).  She will need to bridge with the lovenox.  Will need to stop the lovenox 24 hours before the procedure and remain off for 24 hours after the procedure.  Restart coumadin when instructed by Dr Andree Elk.  Will need to stay on coumadin and lovenox until therapeutic on coumadin.  She will need to cal [place] when she restarts the coumadin so that we can schedule her for a pt/inr check.  (this is the same thing she has done with the two previous injections).  Ok to call in refill of lovenox.

## 2013-02-19 NOTE — Telephone Encounter (Signed)
Appt on 03/06/13 for procedure

## 2013-02-23 ENCOUNTER — Observation Stay: Payer: Self-pay | Admitting: Surgery

## 2013-02-23 LAB — CBC
HCT: 40.8 % (ref 35.0–47.0)
HGB: 13.6 g/dL (ref 12.0–16.0)
MCH: 29.7 pg (ref 26.0–34.0)
MCHC: 33.5 g/dL (ref 32.0–36.0)
MCV: 89 fL (ref 80–100)
Platelet: 285 10*3/uL (ref 150–440)
RBC: 4.59 10*6/uL (ref 3.80–5.20)
RDW: 14.7 % — ABNORMAL HIGH (ref 11.5–14.5)
WBC: 7.5 10*3/uL (ref 3.6–11.0)

## 2013-02-23 LAB — COMPREHENSIVE METABOLIC PANEL
ALK PHOS: 78 U/L
Albumin: 3.5 g/dL (ref 3.4–5.0)
Anion Gap: 4 — ABNORMAL LOW (ref 7–16)
BILIRUBIN TOTAL: 1.1 mg/dL — AB (ref 0.2–1.0)
BUN: 10 mg/dL (ref 7–18)
CALCIUM: 9 mg/dL (ref 8.5–10.1)
CO2: 28 mmol/L (ref 21–32)
CREATININE: 1.04 mg/dL (ref 0.60–1.30)
Chloride: 106 mmol/L (ref 98–107)
EGFR (African American): >60
EGFR (Non-African Amer.): 55 — ABNORMAL LOW
Glucose: 92 mg/dL (ref 65–99)
OSMOLALITY: 274 (ref 275–301)
Potassium: 3.9 mmol/L (ref 3.5–5.1)
SGOT(AST): 21 U/L (ref 15–37)
SGPT (ALT): 17 U/L (ref 12–78)
SODIUM: 138 mmol/L (ref 136–145)
TOTAL PROTEIN: 7.4 g/dL (ref 6.4–8.2)

## 2013-02-23 LAB — PROTIME-INR
INR: 2.2 — AB (ref 0.9–1.1)
INR: 2.2
Prothrombin Time: 23.8 secs — ABNORMAL HIGH (ref 11.5–14.7)

## 2013-02-23 LAB — LIPASE, BLOOD: Lipase: 125 U/L (ref 73–393)

## 2013-02-23 LAB — URINALYSIS, COMPLETE
BLOOD: NEGATIVE
Bacteria: NONE SEEN
Bilirubin,UR: NEGATIVE
Glucose,UR: NEGATIVE mg/dL (ref 0–75)
KETONE: NEGATIVE
NITRITE: NEGATIVE
PROTEIN: NEGATIVE
Ph: 7 (ref 4.5–8.0)
RBC,UR: 4 /HPF (ref 0–5)
Specific Gravity: 1.01 (ref 1.003–1.030)
WBC UR: 22 /HPF (ref 0–5)

## 2013-02-23 LAB — TROPONIN I: Troponin-I: <0.02 ng/mL

## 2013-02-24 ENCOUNTER — Telehealth: Payer: Self-pay | Admitting: Internal Medicine

## 2013-02-24 ENCOUNTER — Other Ambulatory Visit: Payer: Medicare Other

## 2013-02-24 LAB — CBC WITH DIFFERENTIAL/PLATELET
Basophil #: 0.1 10*3/uL (ref 0.0–0.1)
Basophil %: 0.8 %
Eosinophil #: 0.4 10*3/uL (ref 0.0–0.7)
Eosinophil %: 4.6 %
HCT: 40.8 % (ref 35.0–47.0)
HGB: 13.4 g/dL (ref 12.0–16.0)
LYMPHS PCT: 30 %
Lymphocyte #: 2.4 10*3/uL (ref 1.0–3.6)
MCH: 29.5 pg (ref 26.0–34.0)
MCHC: 32.8 g/dL (ref 32.0–36.0)
MCV: 90 fL (ref 80–100)
MONO ABS: 0.7 x10 3/mm (ref 0.2–0.9)
Monocyte %: 8.7 %
Neutrophil #: 4.5 10*3/uL (ref 1.4–6.5)
Neutrophil %: 55.9 %
Platelet: 272 10*3/uL (ref 150–440)
RBC: 4.55 10*6/uL (ref 3.80–5.20)
RDW: 14.8 % — AB (ref 11.5–14.5)
WBC: 8 10*3/uL (ref 3.6–11.0)

## 2013-02-24 LAB — PROTIME-INR
INR: 2.2
Prothrombin Time: 23.7 secs — ABNORMAL HIGH (ref 11.5–14.7)

## 2013-02-24 LAB — APTT: ACTIVATED PTT: 37.5 s — AB (ref 23.6–35.9)

## 2013-02-24 NOTE — Telephone Encounter (Signed)
See below

## 2013-02-24 NOTE — Telephone Encounter (Signed)
Pt called to cancel lab appt. States she is in the hospital.  Would like Dr. Nicki Reaper to call her when she gets a chance, does not have to be today.

## 2013-02-24 NOTE — Telephone Encounter (Signed)
I do not mind calling pt, but please call and see where she is and what is going on with her so that I can be prepared if she has any questions, etc.   Thanks.

## 2013-02-25 LAB — PROTIME-INR
INR: 1.7
INR: 1.7 — AB (ref 0.9–1.1)
PROTHROMBIN TIME: 19.7 s — AB (ref 11.5–14.7)

## 2013-02-25 NOTE — Telephone Encounter (Signed)
She fell the day we had ice, had trouble the next day (Sunday morning) with stomach pain. Was admitted for a muscle bleed. She believe that she was told that Dr. Pat Patrick was going to take over her Coumadin care & she wants to know how you felt about that. It sort of shocked her & they didn't give a reason why at all. [facility] records requested)

## 2013-02-25 NOTE — Telephone Encounter (Signed)
Dr Pat Patrick may be giving recs while following for this bleed.  Please contact Dr Rolin Barry office and see if we can get any information from them regarding coumadin and f/u with him.  Also, I will review records when available.  Need to know who is going to be following.  Thanks.

## 2013-02-26 LAB — CLOSTRIDIUM DIFFICILE(ARMC)

## 2013-02-26 LAB — PROTIME-INR
INR: 1.3
Prothrombin Time: 16.4 secs — ABNORMAL HIGH (ref 11.5–14.7)

## 2013-02-27 ENCOUNTER — Telehealth: Payer: Self-pay | Admitting: Internal Medicine

## 2013-02-27 NOTE — Telephone Encounter (Signed)
Pt states she was just discharged from hospital.  Pt states she needs hospital f/u appt next week on Monday or Tuesday.  Please advise.

## 2013-02-27 NOTE — Telephone Encounter (Signed)
Confirm pt doing ok.  Have her go to [place] for  pt/inr tomorrow am so we know how her level is doing.

## 2013-02-27 NOTE — Telephone Encounter (Signed)
Pt called to check status of appointment.  States the hospital dropped her coumadin to 3 mg and she is concerned.  Advised pt we will call her with appt info.

## 2013-02-27 NOTE — Telephone Encounter (Signed)
Left detailed voicemail

## 2013-02-27 NOTE — Telephone Encounter (Signed)
I have spoken with Dr. Pat Patrick office & she must have misunderstood. She does not have a follow-up or anything with him. The nurse was going to double check with dr. Pat Patrick & give me a call back to be sure.

## 2013-02-27 NOTE — Telephone Encounter (Signed)
Left message for pt to return my call.

## 2013-02-27 NOTE — Telephone Encounter (Signed)
Please advise on an appointment for hospital follow-up

## 2013-02-28 ENCOUNTER — Other Ambulatory Visit (INDEPENDENT_AMBULATORY_CARE_PROVIDER_SITE_OTHER): Payer: Medicare Other

## 2013-02-28 DIAGNOSIS — I82609 Acute embolism and thrombosis of unspecified veins of unspecified upper extremity: Secondary | ICD-10-CM

## 2013-02-28 LAB — PROTIME-INR
INR: 1.4 ratio — ABNORMAL HIGH (ref 0.8–1.0)
Prothrombin Time: 14.7 s — ABNORMAL HIGH (ref 10.2–12.4)

## 2013-02-28 NOTE — Telephone Encounter (Signed)
See other message regarding f/u pt/inr.  Needs 02/28/13 am at [facility].  Also can schedule a hospital f/u next Friday (03/07/13).  Can put her in the 9:00 open slot and block the other open slot at 9:45

## 2013-02-28 NOTE — Telephone Encounter (Signed)
Appointment made

## 2013-02-28 NOTE — Telephone Encounter (Signed)
Can put her in on 03/04/13 at 2:00.  Leave the other slot blocked that pm for her.  (will allow me some extra time with her).  thanks

## 2013-02-28 NOTE — Telephone Encounter (Signed)
Spoke with patient & she states that she can come around 2 today. So, I asked her to come to the [place] location as early as possible.

## 2013-02-28 NOTE — Telephone Encounter (Signed)
Noted  

## 2013-02-28 NOTE — Telephone Encounter (Signed)
Appointment made 2/3

## 2013-02-28 NOTE — Telephone Encounter (Signed)
Robin-please put pt on Dr. Nicki Reaper schedule (It wouldn't let me schedule her) & I will notify pt of appt.-Hospital f/u (Records @ [facility]'s desk)

## 2013-02-28 NOTE — Telephone Encounter (Signed)
Pt needs hospital f/u. See previous phone note.  Please advise.  Pt asking for Monday or Tuesday.

## 2013-03-04 ENCOUNTER — Encounter: Payer: Self-pay | Admitting: Internal Medicine

## 2013-03-04 ENCOUNTER — Ambulatory Visit (INDEPENDENT_AMBULATORY_CARE_PROVIDER_SITE_OTHER): Payer: Medicare Other | Admitting: Internal Medicine

## 2013-03-04 VITALS — BP 118/80 | HR 66 | Temp 98.1°F | Ht 64.0 in | Wt 208.5 lb

## 2013-03-04 DIAGNOSIS — S301XXA Contusion of abdominal wall, initial encounter: Secondary | ICD-10-CM

## 2013-03-04 DIAGNOSIS — R0789 Other chest pain: Secondary | ICD-10-CM

## 2013-03-04 DIAGNOSIS — R079 Chest pain, unspecified: Secondary | ICD-10-CM

## 2013-03-04 DIAGNOSIS — S3690XA Unspecified injury of unspecified intra-abdominal organ, initial encounter: Secondary | ICD-10-CM

## 2013-03-04 DIAGNOSIS — I10 Essential (primary) hypertension: Secondary | ICD-10-CM

## 2013-03-04 DIAGNOSIS — R0781 Pleurodynia: Secondary | ICD-10-CM

## 2013-03-04 DIAGNOSIS — I82609 Acute embolism and thrombosis of unspecified veins of unspecified upper extremity: Secondary | ICD-10-CM

## 2013-03-04 DIAGNOSIS — M25559 Pain in unspecified hip: Secondary | ICD-10-CM

## 2013-03-04 DIAGNOSIS — Z86718 Personal history of other venous thrombosis and embolism: Secondary | ICD-10-CM

## 2013-03-04 DIAGNOSIS — S3011XA Contusion of abdominal wall, initial encounter: Secondary | ICD-10-CM

## 2013-03-04 DIAGNOSIS — M25552 Pain in left hip: Secondary | ICD-10-CM

## 2013-03-04 LAB — CBC WITH DIFFERENTIAL/PLATELET
BASOS ABS: 0.1 10*3/uL (ref 0.0–0.1)
Basophils Relative: 0.8 % (ref 0.0–3.0)
Eosinophils Absolute: 0.3 10*3/uL (ref 0.0–0.7)
Eosinophils Relative: 4.9 % (ref 0.0–5.0)
HCT: 39.8 % (ref 36.0–46.0)
Hemoglobin: 13 g/dL (ref 12.0–15.0)
LYMPHS PCT: 35.8 % (ref 12.0–46.0)
Lymphs Abs: 2.3 10*3/uL (ref 0.7–4.0)
MCHC: 32.6 g/dL (ref 30.0–36.0)
MCV: 90.5 fl (ref 78.0–100.0)
MONOS PCT: 4.4 % (ref 3.0–12.0)
Monocytes Absolute: 0.3 10*3/uL (ref 0.1–1.0)
Neutro Abs: 3.5 10*3/uL (ref 1.4–7.7)
Neutrophils Relative %: 54.1 % (ref 43.0–77.0)
Platelets: 340 10*3/uL (ref 150.0–400.0)
RBC: 4.4 Mil/uL (ref 3.87–5.11)
RDW: 14.4 % (ref 11.5–14.6)
WBC: 6.5 10*3/uL (ref 4.5–10.5)

## 2013-03-04 LAB — PROTIME-INR
INR: 1.8 ratio — AB (ref 0.8–1.0)
PROTHROMBIN TIME: 19.2 s — AB (ref 10.2–12.4)

## 2013-03-04 NOTE — Progress Notes (Signed)
Pre-visit discussion using our clinic review tool. No additional management support is needed unless otherwise documented below in the visit note.  

## 2013-03-08 NOTE — Telephone Encounter (Signed)
Opened in error

## 2013-03-09 ENCOUNTER — Encounter: Payer: Self-pay | Admitting: Internal Medicine

## 2013-03-09 DIAGNOSIS — S301XXA Contusion of abdominal wall, initial encounter: Secondary | ICD-10-CM | POA: Insufficient documentation

## 2013-03-09 NOTE — Assessment & Plan Note (Signed)
Better  

## 2013-03-09 NOTE — Assessment & Plan Note (Signed)
On coumadin.  Follow pt/inr.  Recheck today.  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  Will notify me when ready for third injection.    

## 2013-03-09 NOTE — Assessment & Plan Note (Signed)
Blood pressure as outlined.  Same medication regimen.  Follow. Follow metabolic panel.  

## 2013-03-09 NOTE — Assessment & Plan Note (Signed)
S/p fall.  CT revealed an abdominal wall hematoma.  Resolving.  Follow.  Low therapeutic INR.  Follow.

## 2013-03-09 NOTE — Progress Notes (Signed)
Subjective:    [name], female    DOB: 05-Jun-1945, 68 y.o.   MRN: [id number]  HPI 68 year old female with past history of asthma, allergies/bronchitis, hypertension and hypercholesterolemia who comes in today as a work in for a hospital follow up.   She she was admitted on 02/23/13 after falling.  She slipped on the ice.  No LOC.  Two weeks after the fall, she experienced an acute onset of right sided peri umbilical abdominal pain.  CT revealed a right rectus sheath hematoma.  Her coumadin was adjusted.  She was monitored during her admission at the hospital.  Since her discharge the pain has improved.  Only minimal discomfort now.  No headache.  Back and ribs better.  Hip better.      Past Medical History  Diagnosis Date  . Asthma   . Allergy   . Hypercholesterolemia   . Hypertension   . Chicken pox   . History of blood clots     Current Outpatient Prescriptions on File Prior to Visit  Medication Sig Dispense Refill  . albuterol (PROVENTIL HFA) 108 (90 BASE) MCG/ACT inhaler Inhale 2 puffs into the lungs every 6 (six) hours as needed for wheezing.      . citalopram (CELEXA) 20 MG tablet Take 1 tablet (20 mg total) by mouth daily.  30 tablet  2  . co-enzyme Q-10 30 MG capsule Take 100 mg by mouth daily.      Marland Kitchen HYDROcodone-acetaminophen (NORCO/VICODIN) 5-325 MG per tablet Take 1 tablet by mouth every 8 (eight) hours as needed.  30 tablet  0  . losartan (COZAAR) 50 MG tablet Take 1 tablet (50 mg total) by mouth daily.  30 tablet  5  . pantoprazole (PROTONIX) 40 MG tablet Take 1 tablet (40 mg total) by mouth daily.  30 tablet  2  . warfarin (COUMADIN) 5 MG tablet Take 1 tablet (5 mg total) by mouth daily. Take as directed  30 tablet  5   No current facility-administered medications on file prior to visit.    Review of Systems No headache.  No light headedness.  No sinus or allergy symptoms currently.  No chest pain, tightness or palpitations.  No increased cough or  congestion.  The lower anterior rib pain/upper abdominal pain, side pain (bilateral) and back pain better.  See above and previous notes for details.  This is better s/p injections.  No vomiting or nausea.  Seeing Dr Andree Elk. On coumadin.  Had the recent fall.  Abdominal wall hematoma.  Resolving.  Hip is better.        Objective:   Physical Exam  Filed Vitals:   03/04/13 1356  BP: 118/80  Pulse: 66  Temp: 98.1 F (5.56 C)   68 year old female in no acute distress. HEENT:  Nares- clear.  Oropharynx - without lesions. NECK:  Supple.  Nontender.  No audible bruit.  HEART:  Appears to be regular. LUNGS:  No crackles or wheezing audible.  Respirations even and unlabored.  RADIAL PULSE:  Equal bilaterally.   ABDOMEN:  Soft.  Minimal tenderness to palpation over the right lower abdominal region.  No significant pain to palpation.  Bowel sounds present and normal.  No audible abdominal bruit.   EXTREMITIES:  No increased edema present.      Assessment & Plan:  HEADACHE.  Not reported as an issue today.     CARDIOVASCULAR.  Recent ECHO with normal LV function.  No regional wall  motion abnormalities, mild diastolic dysfunction.  Currently stable.  Follow.    ABDOMINAL/BACK PAIN.  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  See report for details.  On hydrocodone.  Saw Dr Andree Elk.  S/p epidural injections x 2.  Better.  Follow.  Will let me know when third planned.  May try to postpone since doing better.     HEALTH MAINTENANCE.  Physical 08/13/12.   Last colonoscopy (per her report) - 2012.  Recommended f/u (per pt) 10 years.  Mammogram scheduled previously.  Rescheduled.

## 2013-03-09 NOTE — Assessment & Plan Note (Signed)
Saw Dr Pandit.  Recommended lifelong coumadin.   

## 2013-03-09 NOTE — Assessment & Plan Note (Signed)
Better s/p injections.  Follow.

## 2013-03-12 ENCOUNTER — Other Ambulatory Visit (INDEPENDENT_AMBULATORY_CARE_PROVIDER_SITE_OTHER): Payer: Medicare Other

## 2013-03-12 ENCOUNTER — Other Ambulatory Visit: Payer: Self-pay | Admitting: Internal Medicine

## 2013-03-12 DIAGNOSIS — R791 Abnormal coagulation profile: Secondary | ICD-10-CM

## 2013-03-12 DIAGNOSIS — Z86718 Personal history of other venous thrombosis and embolism: Secondary | ICD-10-CM

## 2013-03-12 LAB — PROTIME-INR
INR: 3.8 ratio — AB (ref 0.8–1.0)
PROTHROMBIN TIME: 39.5 s — AB (ref 10.2–12.4)

## 2013-03-12 NOTE — Progress Notes (Signed)
Orders placed for labs

## 2013-03-13 ENCOUNTER — Other Ambulatory Visit (INDEPENDENT_AMBULATORY_CARE_PROVIDER_SITE_OTHER): Payer: Medicare Other

## 2013-03-13 DIAGNOSIS — R791 Abnormal coagulation profile: Secondary | ICD-10-CM

## 2013-03-13 LAB — HEPATIC FUNCTION PANEL
ALT: 11 U/L (ref 0–35)
AST: 17 U/L (ref 0–37)
Albumin: 3.7 g/dL (ref 3.5–5.2)
Alkaline Phosphatase: 60 U/L (ref 39–117)
BILIRUBIN TOTAL: 1.2 mg/dL (ref 0.3–1.2)
Bilirubin, Direct: 0.1 mg/dL (ref 0.0–0.3)
Total Protein: 7 g/dL (ref 6.0–8.3)

## 2013-03-13 LAB — PROTIME-INR
INR: 4.3 ratio — ABNORMAL HIGH (ref 0.8–1.0)
Prothrombin Time: 44.1 s — ABNORMAL HIGH (ref 10.2–12.4)

## 2013-03-14 ENCOUNTER — Encounter: Payer: Self-pay | Admitting: *Deleted

## 2013-03-14 ENCOUNTER — Other Ambulatory Visit (INDEPENDENT_AMBULATORY_CARE_PROVIDER_SITE_OTHER): Payer: Medicare Other

## 2013-03-14 DIAGNOSIS — R791 Abnormal coagulation profile: Secondary | ICD-10-CM

## 2013-03-14 LAB — PROTIME-INR
INR: 3.1 ratio — AB (ref 0.8–1.0)
PROTHROMBIN TIME: 32.2 s — AB (ref 10.2–12.4)

## 2013-03-19 ENCOUNTER — Other Ambulatory Visit: Payer: Medicare Other

## 2013-03-19 ENCOUNTER — Telehealth: Payer: Self-pay | Admitting: Internal Medicine

## 2013-03-19 NOTE — Telephone Encounter (Signed)
Wants to let Dr. Nicki Reaper know that she cannot come in today for her blood work due to ice.  Does not want Dr. Nicki Reaper to worry about her.

## 2013-03-19 NOTE — Telephone Encounter (Signed)
If weather permitting, can she come in tomorrow?

## 2013-03-19 NOTE — Telephone Encounter (Signed)
Pt will come tomorrow around 10am (weather permitting)

## 2013-03-20 ENCOUNTER — Encounter: Payer: Self-pay | Admitting: *Deleted

## 2013-03-20 ENCOUNTER — Other Ambulatory Visit (INDEPENDENT_AMBULATORY_CARE_PROVIDER_SITE_OTHER): Payer: Medicare Other

## 2013-03-20 DIAGNOSIS — R791 Abnormal coagulation profile: Secondary | ICD-10-CM

## 2013-03-20 LAB — PROTIME-INR
INR: 1.4 ratio — AB (ref 0.8–1.0)
PROTHROMBIN TIME: 15.1 s — AB (ref 10.2–12.4)

## 2013-03-20 NOTE — Telephone Encounter (Signed)
Pt wanted to know if drinking pepsi would cause her blood  Level to  go from 2.2 to 4.3.  She stated this is only thing different she has done.

## 2013-03-20 NOTE — Telephone Encounter (Signed)
See lab message.  Pepsi should not interfere

## 2013-03-25 ENCOUNTER — Other Ambulatory Visit: Payer: Medicare Other

## 2013-03-26 ENCOUNTER — Telehealth: Payer: Self-pay | Admitting: Internal Medicine

## 2013-03-26 ENCOUNTER — Other Ambulatory Visit (INDEPENDENT_AMBULATORY_CARE_PROVIDER_SITE_OTHER): Payer: Medicare Other

## 2013-03-26 DIAGNOSIS — Z7901 Long term (current) use of anticoagulants: Secondary | ICD-10-CM

## 2013-03-26 LAB — PROTIME-INR
INR: 1.5 ratio — AB (ref 0.8–1.0)
Prothrombin Time: 15.6 s — ABNORMAL HIGH (ref 10.2–12.4)

## 2013-03-26 NOTE — Telephone Encounter (Signed)
Spoke to Dr Ma Hillock.  Since >6 months will hold on bridging with Lovenox.  She will stop her coumadin 5 days prior to her procedure.  Will call us back when to restart.  Aware of risk of being off coumadin.  She is wanting to proceed with planned procedure and off coumadin.

## 2013-03-26 NOTE — Telephone Encounter (Signed)
Pt is going to pain clinic 04/02/13 and they want her to stop her comdian 5 days before her appointment.

## 2013-03-26 NOTE — Telephone Encounter (Signed)
Pt states that you mentioned that you would try not using the Lovenox. Please advise

## 2013-03-27 ENCOUNTER — Other Ambulatory Visit: Payer: Medicare Other

## 2013-04-02 ENCOUNTER — Encounter: Payer: Self-pay | Admitting: Internal Medicine

## 2013-04-02 ENCOUNTER — Ambulatory Visit: Payer: Self-pay | Admitting: Anesthesiology

## 2013-04-03 ENCOUNTER — Encounter: Payer: Self-pay | Admitting: *Deleted

## 2013-04-03 ENCOUNTER — Other Ambulatory Visit (INDEPENDENT_AMBULATORY_CARE_PROVIDER_SITE_OTHER): Payer: Medicare Other

## 2013-04-03 DIAGNOSIS — Z7901 Long term (current) use of anticoagulants: Secondary | ICD-10-CM

## 2013-04-03 LAB — PROTIME-INR
INR: 1.2 ratio — ABNORMAL HIGH (ref 0.8–1.0)
PROTHROMBIN TIME: 12.8 s — AB (ref 10.2–12.4)

## 2013-04-11 ENCOUNTER — Other Ambulatory Visit: Payer: Self-pay | Admitting: Internal Medicine

## 2013-04-11 ENCOUNTER — Encounter: Payer: Self-pay | Admitting: Internal Medicine

## 2013-04-11 ENCOUNTER — Ambulatory Visit (INDEPENDENT_AMBULATORY_CARE_PROVIDER_SITE_OTHER): Payer: Medicare Other | Admitting: Internal Medicine

## 2013-04-11 VITALS — BP 130/70 | HR 79 | Temp 98.2°F | Ht 64.0 in | Wt 206.8 lb

## 2013-04-11 DIAGNOSIS — M25552 Pain in left hip: Secondary | ICD-10-CM

## 2013-04-11 DIAGNOSIS — I10 Essential (primary) hypertension: Secondary | ICD-10-CM

## 2013-04-11 DIAGNOSIS — R197 Diarrhea, unspecified: Secondary | ICD-10-CM

## 2013-04-11 DIAGNOSIS — Z7901 Long term (current) use of anticoagulants: Secondary | ICD-10-CM

## 2013-04-11 DIAGNOSIS — R079 Chest pain, unspecified: Secondary | ICD-10-CM

## 2013-04-11 DIAGNOSIS — S3011XA Contusion of abdominal wall, initial encounter: Secondary | ICD-10-CM

## 2013-04-11 DIAGNOSIS — S301XXA Contusion of abdominal wall, initial encounter: Secondary | ICD-10-CM

## 2013-04-11 DIAGNOSIS — E7800 Pure hypercholesterolemia, unspecified: Secondary | ICD-10-CM

## 2013-04-11 DIAGNOSIS — I82609 Acute embolism and thrombosis of unspecified veins of unspecified upper extremity: Secondary | ICD-10-CM

## 2013-04-11 DIAGNOSIS — R0781 Pleurodynia: Secondary | ICD-10-CM

## 2013-04-11 DIAGNOSIS — Z9109 Other allergy status, other than to drugs and biological substances: Secondary | ICD-10-CM

## 2013-04-11 DIAGNOSIS — M25559 Pain in unspecified hip: Secondary | ICD-10-CM

## 2013-04-11 DIAGNOSIS — Z86718 Personal history of other venous thrombosis and embolism: Secondary | ICD-10-CM

## 2013-04-11 DIAGNOSIS — J45909 Unspecified asthma, uncomplicated: Secondary | ICD-10-CM

## 2013-04-11 NOTE — Progress Notes (Signed)
Pre-visit discussion using our clinic review tool. No additional management support is needed unless otherwise documented below in the visit note.  

## 2013-04-12 ENCOUNTER — Encounter: Payer: Self-pay | Admitting: Internal Medicine

## 2013-04-12 LAB — PROTIME-INR
INR: 2.34 — AB (ref <1.50)
Prothrombin Time: 25.1 seconds — ABNORMAL HIGH (ref 11.6–15.2)

## 2013-04-13 ENCOUNTER — Encounter: Payer: Self-pay | Admitting: Internal Medicine

## 2013-04-13 NOTE — Progress Notes (Signed)
Subjective:    [name], female    DOB: 11-27-1945, 68 y.o.   MRN: [id number]  HPI 68 year old female with past history of asthma, allergies/bronchitis, hypertension and hypercholesterolemia who comes in today for a scheduled follow up.   She she was admitted on 02/23/13 after falling.  She slipped on the ice.  No LOC.  Two weeks after the fall, she experienced an acute onset of right sided peri umbilical abdominal pain.  CT revealed a right rectus sheath hematoma.  Her coumadin was adjusted.  She was monitored during her admission at the hospital.  Since her discharge the pain has resolved.  Hematoma resolved.  No headache.  Does report some increased cough.  Increased congestion.  Took Double Tussin.  Is better now.  No sob.  Cough better.  Eating and drinking well.  No nausea or vomiting.  Bowels stable.      Past Medical History  Diagnosis Date  . Asthma   . Allergy   . Hypercholesterolemia   . Hypertension   . Chicken pox   . History of blood clots     Current Outpatient Prescriptions on File Prior to Visit  Medication Sig Dispense Refill  . albuterol (PROVENTIL HFA) 108 (90 BASE) MCG/ACT inhaler Inhale 2 puffs into the lungs every 6 (six) hours as needed for wheezing.      . citalopram (CELEXA) 20 MG tablet Take 1 tablet (20 mg total) by mouth daily.  30 tablet  2  . co-enzyme Q-10 30 MG capsule Take 100 mg by mouth daily.      Marland Kitchen HYDROcodone-acetaminophen (NORCO/VICODIN) 5-325 MG per tablet Take 1 tablet by mouth every 8 (eight) hours as needed.  30 tablet  0  . pantoprazole (PROTONIX) 40 MG tablet Take 1 tablet (40 mg total) by mouth daily.  30 tablet  2   No current facility-administered medications on file prior to visit.    Review of Systems No headache.  No light headedness.   No chest pain, tightness or palpitations.  Does report some increased cough or congestion.  Better now.  Back pain and rib pain better s/p injections.  No vomiting or nausea.  Seeing Dr  Andree Elk. On coumadin.  Hematoma resolved.  Hip is better.  Overall she feels she is doing well.         Objective:   Physical Exam  Filed Vitals:   04/11/13 1620  BP: 130/70  Pulse: 79  Temp: 98.2 F (30.45 C)   68 year old female in no acute distress. HEENT:  Nares- clear.  Oropharynx - without lesions. NECK:  Supple.  Nontender.  No audible bruit.  HEART:  Appears to be regular. LUNGS:  No crackles or wheezing audible.  Respirations even and unlabored.  RADIAL PULSE:  Equal bilaterally.   ABDOMEN:  Soft.  Non tender.   No audible abdominal bruit.   EXTREMITIES:  No increased edema present.      Assessment & Plan:  HEADACHE.  Not reported as an issue today.     CARDIOVASCULAR.  Recent ECHO with normal LV function.  No regional wall motion abnormalities, mild diastolic dysfunction.  Currently stable.  Follow.    ABDOMINAL/BACK PAIN.  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  See report for details.  On hydrocodone.  Saw Dr Andree Elk.  S/p epidural injections x 3.  Better.  Follow.      HEALTH MAINTENANCE.  Physical 08/13/12.   Last colonoscopy (per her report) -  2012.  Recommended f/u (per pt) 10 years.  Mammogram scheduled previously.  Rescheduled.

## 2013-04-13 NOTE — Assessment & Plan Note (Signed)
Saw Dr Pandit.  Recommended lifelong coumadin.   

## 2013-04-13 NOTE — Assessment & Plan Note (Signed)
Resolved

## 2013-04-13 NOTE — Assessment & Plan Note (Signed)
Blood pressure as outlined.  Same medication regimen.  Follow. Follow metabolic panel.  

## 2013-04-13 NOTE — Assessment & Plan Note (Signed)
On coumadin.  Follow pt/inr.  Recheck today.  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

## 2013-04-13 NOTE — Assessment & Plan Note (Signed)
Uses Flonase.  Follow.

## 2013-04-13 NOTE — Assessment & Plan Note (Signed)
Off pravastatin.  Low cholesterol diet.  Will follow.

## 2013-04-13 NOTE — Assessment & Plan Note (Signed)
Feels her breathing is stable.  Follow.  Current congestion improved.  Better.  Follow.

## 2013-04-14 ENCOUNTER — Encounter: Payer: Self-pay | Admitting: *Deleted

## 2013-04-16 ENCOUNTER — Encounter: Payer: Self-pay | Admitting: Adult Health

## 2013-04-16 ENCOUNTER — Telehealth: Payer: Self-pay | Admitting: Internal Medicine

## 2013-04-16 ENCOUNTER — Ambulatory Visit (INDEPENDENT_AMBULATORY_CARE_PROVIDER_SITE_OTHER): Payer: Medicare Other | Admitting: Adult Health

## 2013-04-16 VITALS — BP 112/66 | HR 75 | Temp 97.7°F | Wt 207.0 lb

## 2013-04-16 DIAGNOSIS — R0602 Shortness of breath: Secondary | ICD-10-CM

## 2013-04-16 DIAGNOSIS — R05 Cough: Secondary | ICD-10-CM

## 2013-04-16 DIAGNOSIS — R062 Wheezing: Secondary | ICD-10-CM

## 2013-04-16 DIAGNOSIS — R059 Cough, unspecified: Secondary | ICD-10-CM

## 2013-04-16 MED ORDER — PREDNISONE 10 MG PO TABS: ORAL_TABLET | ORAL | Status: DC

## 2013-04-16 MED ORDER — ALBUTEROL SULFATE (2.5 MG/3ML) 0.083% IN NEBU
2.5000 mg | INHALATION_SOLUTION | Freq: Once | RESPIRATORY_TRACT | Status: AC
  Administered 2013-04-16: 2.5 mg via RESPIRATORY_TRACT

## 2013-04-16 MED ORDER — CEFUROXIME AXETIL 250 MG PO TABS: 250.0000 mg | ORAL_TABLET | Freq: Two times a day (BID) | ORAL | Status: DC

## 2013-04-16 NOTE — Addendum Note (Signed)
Addended by: Vernetta Honey on: 04/16/2013 10:52 AM   Modules accepted: Orders

## 2013-04-16 NOTE — Patient Instructions (Addendum)
  Start Ceftin twice a day for 7 days.  Start prednisone taper as follows:  Day #1 - take 6 tablets Day #2 - take 5 tablets Day #3 - take 4 tablets Day #4 - take 3 tablets Day #5 - take 2 tablets Day #6 - take 1 tablet  For the next 2 days use your albuterol inhaler every 6 hours. 2 puffs into the lungs.   Take on over the counter cough medicine such as Delsym or robitussin.  Return to clinic of Friday for follow up and PT/INR check.

## 2013-04-16 NOTE — Telephone Encounter (Signed)
Patient Information:  Caller Name: Cheris  Phone: [phone]  Patient: Tina Duarte, Tina Duarte  Gender: Female  DOB: [birth year]  Age: 68 Years  PCP: Einar Pheasant  Office Follow Up:  Does the office need to follow up with this patient?: No  Instructions For The Office: N/A  RN Note:  Will obtain appt. for this pt. Pt. to see Raquel Rey at 10:15.  Symptoms  Reason For Call & Symptoms: Onset of cough 04/02/13. Went away after one week. Now on 04/15/13, the cough returned. Chest does not feel tight. Had Spinal injection on 04/02/13. Using inhaler. Wheezing. No fever.  Reviewed Health History In EMR: Yes  Reviewed Medications In EMR: Yes  Reviewed Allergies In EMR: Yes  Reviewed Surgeries / Procedures: Yes  Date of Onset of Symptoms: 04/15/2013  Treatments Tried: Nyquil, Alavert and Inhaler  Treatments Tried Worked: No  Guideline(s) Used:  Cough  Disposition Per Guideline:   Go to Office Now  Reason For Disposition Reached:   Wheezing is present  Advice Given:  Call Back If:  Difficulty breathing  You become worse.  Patient Will Follow Care Advice:  YES  Appointment Scheduled:  04/16/2013 10:15:00 Appointment Scheduled Provider:  Charolette Forward

## 2013-04-16 NOTE — Telephone Encounter (Signed)
FYI-pt coming in today to see Raquel

## 2013-04-16 NOTE — Progress Notes (Signed)
Pre visit review using our clinic review tool, if applicable. No additional management support is needed unless otherwise documented below in the visit note. 

## 2013-04-16 NOTE — Progress Notes (Signed)
[name], female   DOB: [birth year], 68 y.o.   MRN: [id number]    Subjective:    [name], female    DOB: Feb 02, 1945, 68 y.o.   MRN: [id number]  HPI  Pt is a pleasant 68 y/o female who presents to clinic with cough and nasal drainage that started yesterday. Denies fever, chills. Hx of allergies.  Past Medical History  Diagnosis Date  . Asthma   . Allergy   . Hypercholesterolemia   . Hypertension   . Chicken pox   . History of blood clots     Current Outpatient Prescriptions on File Prior to Visit  Medication Sig Dispense Refill  . albuterol (PROVENTIL HFA) 108 (90 BASE) MCG/ACT inhaler Inhale 2 puffs into the lungs every 6 (six) hours as needed for wheezing.      . citalopram (CELEXA) 20 MG tablet Take 1 tablet (20 mg total) by mouth daily.  30 tablet  2  . co-enzyme Q-10 30 MG capsule Take 100 mg by mouth daily.      Marland Kitchen HYDROcodone-acetaminophen (NORCO/VICODIN) 5-325 MG per tablet Take 1 tablet by mouth every 8 (eight) hours as needed.  30 tablet  0  . losartan (COZAAR) 50 MG tablet TAKE ONE (1) TABLET EACH DAY  90 tablet  0  . pantoprazole (PROTONIX) 40 MG tablet Take 1 tablet (40 mg total) by mouth daily.  30 tablet  2  . warfarin (COUMADIN) 5 MG tablet Take 5 mg by mouth daily. Take as directed (As of 04/11/13: Take 2.5 mg on Tuesday & Thursday, and 5mg  all other days)       No current facility-administered medications on file prior to visit.     Review of Systems  Constitutional: Negative for fever and chills.  HENT: Positive for postnasal drip, rhinorrhea and sinus pressure.   Respiratory: Positive for cough, shortness of breath and wheezing.   Cardiovascular: Negative for chest pain and palpitations.  All other systems reviewed and are negative.       Objective:  BP 112/66  Pulse 75  Temp(Src) 97.7 F (36.5 C) (Oral)  Wt 207 lb (93.895 kg)  SpO2 98%  LMP 04/29/1997   Physical Exam  Constitutional: She is oriented to person, place, and  time.  Appears acutely ill  HENT:  Head: Normocephalic and atraumatic.  Bilateral ear canal erythematous as well as the pharynx. No exudate noted  Cardiovascular: Normal rate and regular rhythm.   Pulmonary/Chest: Effort normal. No respiratory distress. She has wheezes. She has no rales.  Lymphadenopathy:    She has cervical adenopathy.  Neurological: She is alert and oriented to person, place, and time.  Skin: Skin is warm and dry.  Psychiatric: She has a normal mood and affect. Her behavior is normal. Judgment and thought content normal.      Assessment & Plan:   1. Cough Suspect bronchitis. Lungs with exp wheezing. Albuterol tx in office. Start Ceftin bid x 7 days. Use albuterol every 6 hours for the next 2 days then prn. Start prednisone taper as instructed. RTC on Friday for follow up and PT/INR check.

## 2013-04-17 ENCOUNTER — Encounter: Payer: Self-pay | Admitting: *Deleted

## 2013-04-17 ENCOUNTER — Other Ambulatory Visit: Payer: Medicare Other

## 2013-04-18 ENCOUNTER — Ambulatory Visit (INDEPENDENT_AMBULATORY_CARE_PROVIDER_SITE_OTHER): Payer: Medicare Other | Admitting: Adult Health

## 2013-04-18 ENCOUNTER — Encounter: Payer: Self-pay | Admitting: *Deleted

## 2013-04-18 ENCOUNTER — Encounter: Payer: Self-pay | Admitting: Adult Health

## 2013-04-18 ENCOUNTER — Other Ambulatory Visit (INDEPENDENT_AMBULATORY_CARE_PROVIDER_SITE_OTHER): Payer: Medicare Other

## 2013-04-18 VITALS — BP 132/72 | HR 100 | Temp 97.8°F | Resp 14

## 2013-04-18 DIAGNOSIS — R059 Cough, unspecified: Secondary | ICD-10-CM

## 2013-04-18 DIAGNOSIS — Z7901 Long term (current) use of anticoagulants: Secondary | ICD-10-CM

## 2013-04-18 DIAGNOSIS — R05 Cough: Secondary | ICD-10-CM

## 2013-04-18 LAB — PROTIME-INR
INR: 4.5 ratio — ABNORMAL HIGH (ref 0.8–1.0)
Prothrombin Time: 46.1 s — ABNORMAL HIGH (ref 10.2–12.4)

## 2013-04-18 NOTE — Progress Notes (Signed)
[name], female   DOB: [birth year], 68 y.o.   MRN: [id number]   Subjective:    [name], female    DOB: 26-May-1945, 68 y.o.   MRN: [id number]  HPI  Pt is a pleasant 68 y/o female who presents to clinic for follow up of cough, suspected bronchitis. She was seen 2 days ago and started on Ceftin and prednisone taper. She has also been using her albuterol inhaler every 6 hours. Starting tomorrow she can use it prn every 6 hours. She is feeling improved. Cough has improved. She needs to have her PT/INR check today as well.    Past Medical History  Diagnosis Date  . Asthma   . Allergy   . Hypercholesterolemia   . Hypertension   . Chicken pox   . History of blood clots     Current Outpatient Prescriptions on File Prior to Visit  Medication Sig Dispense Refill  . albuterol (PROVENTIL HFA) 108 (90 BASE) MCG/ACT inhaler Inhale 2 puffs into the lungs every 6 (six) hours as needed for wheezing.      . cefUROXime (CEFTIN) 250 MG tablet Take 1 tablet (250 mg total) by mouth 2 (two) times daily with a meal.  14 tablet  0  . citalopram (CELEXA) 20 MG tablet Take 1 tablet (20 mg total) by mouth daily.  30 tablet  2  . co-enzyme Q-10 30 MG capsule Take 100 mg by mouth daily.      Marland Kitchen HYDROcodone-acetaminophen (NORCO/VICODIN) 5-325 MG per tablet Take 1 tablet by mouth every 8 (eight) hours as needed.  30 tablet  0  . losartan (COZAAR) 50 MG tablet TAKE ONE (1) TABLET EACH DAY  90 tablet  0  . pantoprazole (PROTONIX) 40 MG tablet Take 1 tablet (40 mg total) by mouth daily.  30 tablet  2  . predniSONE (DELTASONE) 10 MG tablet Take 60 mg (6 tablets) on the first day then decrease by 10 mg (1 tablet) daily until done.  21 tablet  0  . warfarin (COUMADIN) 5 MG tablet Take 5 mg by mouth daily. Take as directed (As of 04/11/13: Take 2.5 mg on Tuesday & Thursday, and 5mg  all other days)       No current facility-administered medications on file prior to visit.     Review of Systems    Constitutional: Negative for fatigue.  Respiratory: Positive for cough and wheezing (improved). Negative for shortness of breath.   Cardiovascular: Negative.   Gastrointestinal: Negative.   Genitourinary: Negative.   Neurological:       Feeling a little "swimmy headed"  Psychiatric/Behavioral: Negative.   All other systems reviewed and are negative.       Objective:  BP 132/72  Pulse 100  Temp(Src) 97.8 F (36.6 C) (Oral)  Resp 14  SpO2 97%  LMP 04/29/1997   Physical Exam  Constitutional: She is oriented to person, place, and time. She appears well-developed and well-nourished. No distress.  Cardiovascular: Normal rate, regular rhythm and normal heart sounds.  Exam reveals no gallop.   No murmur heard. Pulmonary/Chest: Effort normal. No respiratory distress. She has wheezes. She has no rales.  Faint wheezes. Significantly improved from Wednesday.  Musculoskeletal: Normal range of motion.  Neurological: She is alert and oriented to person, place, and time.  Skin: Skin is warm and dry.  Psychiatric: She has a normal mood and affect. Her behavior is normal. Judgment and thought content normal.       Assessment &  Plan:   1. Cough Improved. Continues Ceftin and prednisone taper. Today she will continue to use her albuterol every 6 hours then prn. Wheezing is also significantly improved. Only faint wheezing heard posterior left upper lobe. Good air movement throughout. Instructed to cough and deep breathe every 2-3 hours. She has been resting mostly. Instructed to get up and move around. Rest in between. RTC if symptoms do not improve. She had her PT/INR checked today. We will adjust dose as needed.

## 2013-04-18 NOTE — Progress Notes (Signed)
Pre visit review using our clinic review tool, if applicable. No additional management support is needed unless otherwise documented below in the visit note. 

## 2013-04-20 ENCOUNTER — Telehealth: Payer: Self-pay | Admitting: Internal Medicine

## 2013-04-20 ENCOUNTER — Other Ambulatory Visit: Payer: Self-pay | Admitting: Internal Medicine

## 2013-04-20 DIAGNOSIS — I82409 Acute embolism and thrombosis of unspecified deep veins of unspecified lower extremity: Secondary | ICD-10-CM

## 2013-04-20 LAB — PROTIME-INR
INR: 1.6
INR: 1.6 — AB (ref 0.9–1.1)
PROTHROMBIN TIME: 18.3 s — AB (ref 11.5–14.7)

## 2013-04-20 NOTE — Progress Notes (Signed)
Orders placed for f/u pt/inr

## 2013-04-20 NOTE — Telephone Encounter (Signed)
INR 1.6.  Pt instructed to take 5mg  today and then 5mg  on Tues and Thursday and 2.5mg  all other days.  Recheck pt/inr on Thursday 04/24/13.

## 2013-04-24 ENCOUNTER — Other Ambulatory Visit (INDEPENDENT_AMBULATORY_CARE_PROVIDER_SITE_OTHER): Payer: Medicare Other

## 2013-04-24 ENCOUNTER — Encounter: Payer: Self-pay | Admitting: Internal Medicine

## 2013-04-24 ENCOUNTER — Ambulatory Visit (INDEPENDENT_AMBULATORY_CARE_PROVIDER_SITE_OTHER): Payer: Medicare Other | Admitting: Internal Medicine

## 2013-04-24 VITALS — BP 120/78 | HR 75 | Temp 98.0°F | Ht 64.0 in | Wt 210.5 lb

## 2013-04-24 DIAGNOSIS — Z7901 Long term (current) use of anticoagulants: Secondary | ICD-10-CM

## 2013-04-24 DIAGNOSIS — I10 Essential (primary) hypertension: Secondary | ICD-10-CM

## 2013-04-24 DIAGNOSIS — R05 Cough: Secondary | ICD-10-CM

## 2013-04-24 DIAGNOSIS — I82609 Acute embolism and thrombosis of unspecified veins of unspecified upper extremity: Secondary | ICD-10-CM

## 2013-04-24 DIAGNOSIS — R059 Cough, unspecified: Secondary | ICD-10-CM

## 2013-04-24 LAB — PROTIME-INR
INR: 2.6 ratio — ABNORMAL HIGH (ref 0.8–1.0)
Prothrombin Time: 26.6 s — ABNORMAL HIGH (ref 10.2–12.4)

## 2013-04-24 MED ORDER — PREDNISONE 10 MG PO TABS: ORAL_TABLET | ORAL | Status: DC

## 2013-04-24 MED ORDER — CEFDINIR 300 MG PO CAPS: 300.0000 mg | ORAL_CAPSULE | Freq: Two times a day (BID) | ORAL | Status: DC

## 2013-04-24 NOTE — Progress Notes (Signed)
Subjective:    [name], female    DOB: [birth year], 68 y.o.   MRN: [id number]  Sinusitis  68 year old female with past history of asthma, allergies/bronchitis, hypertension and hypercholesterolemia who comes in today as a work in with concerns regarding persistent cough and congestion.  She was recently evaluated by Raquel Rey for cough and congestion.  See her notes for details.  Treated with ceftin and prednisone.  Symptoms improved but never completely resolved.  Worsening now.  Reports head congestion and nasal congestion.  Increased drainage.  Ears stopped up.  Increased cough and chest congestion.  Using a rescue inhaler every six hours.  Eating and drinking well.  No nausea or vomiting.  Bowels stable.      Past Medical History  Diagnosis Date  . Asthma   . Allergy   . Hypercholesterolemia   . Hypertension   . Chicken pox   . History of blood clots     Current Outpatient Prescriptions on File Prior to Visit  Medication Sig Dispense Refill  . albuterol (PROVENTIL HFA) 108 (90 BASE) MCG/ACT inhaler Inhale 2 puffs into the lungs every 6 (six) hours as needed for wheezing.      . citalopram (CELEXA) 20 MG tablet Take 1 tablet (20 mg total) by mouth daily.  30 tablet  2  . co-enzyme Q-10 30 MG capsule Take 100 mg by mouth daily.      Marland Kitchen HYDROcodone-acetaminophen (NORCO/VICODIN) 5-325 MG per tablet Take 1 tablet by mouth every 8 (eight) hours as needed.  30 tablet  0  . losartan (COZAAR) 50 MG tablet TAKE ONE (1) TABLET EACH DAY  90 tablet  0  . pantoprazole (PROTONIX) 40 MG tablet Take 1 tablet (40 mg total) by mouth daily.  30 tablet  2  . warfarin (COUMADIN) 5 MG tablet Take 5 mg by mouth daily. Take as directed (As of 04/11/13: Take 2.5 mg on Tuesday & Thursday, and 5mg  all other days)       No current facility-administered medications on file prior to visit.    Review of Systems No headache.  No light headedness.   Does report some head congestion and nasal  congestion.  Increased drainage.  Ears stopped up.  Increased cough and chest congestion.  No chest pain, tightness or palpitations.   No nausea or vomiting.  No bowel change.         Objective:   Physical Exam  Filed Vitals:   04/24/13 1111  BP: 120/78  Pulse: 75  Temp: 98 F (9.54 C)   68 year old female in no acute distress. HEENT:  Nares- slightly erythematous turbinates.  Oropharynx - without lesions.  Minimal tenderness to palpation over the sinuses.   TMs visualized without erythema.   NECK:  Supple.  Nontender.   HEART:  Appears to be regular. LUNGS:  No crackles or wheezing audible.  Respirations even and unlabored.  Increased cough with forced expiration.      Assessment & Plan:  CARDIOVASCULAR.  Recent ECHO with normal LV function.  No regional wall motion abnormalities, mild diastolic dysfunction.  Currently stable.  Follow.    ABDOMINAL/BACK PAIN.  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  See report for details.  On hydrocodone.  Saw Dr Andree Elk.  S/p epidural injections x 3.  Better.  Follow.      HEALTH MAINTENANCE.  Physical 08/13/12.   Last colonoscopy (per her report) - 2012.  Recommended f/u (per pt) 10  years.  Mammogram scheduled previously.  Rescheduled.

## 2013-04-24 NOTE — Progress Notes (Signed)
Pre-visit discussion using our clinic review tool. No additional management support is needed unless otherwise documented below in the visit note.  

## 2013-04-27 ENCOUNTER — Encounter: Payer: Self-pay | Admitting: Internal Medicine

## 2013-04-27 NOTE — Assessment & Plan Note (Signed)
Blood pressure as outlined.  Same medication regimen.  Follow. Follow metabolic panel.  

## 2013-04-27 NOTE — Assessment & Plan Note (Signed)
On coumadin.  Follow pt/inr.  Recheck today ok.  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  Will need to recheck pt/inr soon since starting abx.

## 2013-04-27 NOTE — Assessment & Plan Note (Signed)
Symptoms as outlined.  Sinusitis/uri.  Symptoms worsening.  Treat with omnicef as directed.  Prednisone taper starting at 60mg  and decreasing by 5mg  each day until off.  Saline nasal spray and steroid nasal spray as directed.  Robitussin as directed.  Follow.  Rest.  Fluids.  Explained if symptoms change, worsen or do not resolve - she is to be reevaluated.

## 2013-04-28 ENCOUNTER — Telehealth: Payer: Self-pay | Admitting: Internal Medicine

## 2013-04-28 ENCOUNTER — Other Ambulatory Visit (INDEPENDENT_AMBULATORY_CARE_PROVIDER_SITE_OTHER): Payer: Medicare Other

## 2013-04-28 DIAGNOSIS — Z7901 Long term (current) use of anticoagulants: Secondary | ICD-10-CM

## 2013-04-28 LAB — PROTIME-INR
INR: 2.8 ratio — ABNORMAL HIGH (ref 0.8–1.0)
PROTHROMBIN TIME: 28.9 s — AB (ref 10.2–12.4)

## 2013-04-28 NOTE — Telephone Encounter (Signed)
Pt came in today for labs and wanted to let you know that she is feeling better

## 2013-04-29 ENCOUNTER — Encounter: Payer: Self-pay | Admitting: *Deleted

## 2013-05-05 ENCOUNTER — Other Ambulatory Visit (INDEPENDENT_AMBULATORY_CARE_PROVIDER_SITE_OTHER): Payer: Medicare Other

## 2013-05-05 DIAGNOSIS — Z7901 Long term (current) use of anticoagulants: Secondary | ICD-10-CM

## 2013-05-05 LAB — PROTIME-INR
INR: 3.1 ratio — AB (ref 0.8–1.0)
PROTHROMBIN TIME: 31.6 s — AB (ref 10.2–12.4)

## 2013-05-07 ENCOUNTER — Encounter: Payer: Self-pay | Admitting: *Deleted

## 2013-05-08 ENCOUNTER — Other Ambulatory Visit: Payer: Medicare Other

## 2013-05-12 ENCOUNTER — Ambulatory Visit: Payer: Self-pay | Admitting: Anesthesiology

## 2013-05-22 ENCOUNTER — Other Ambulatory Visit (INDEPENDENT_AMBULATORY_CARE_PROVIDER_SITE_OTHER): Payer: Medicare Other

## 2013-05-22 DIAGNOSIS — Z7901 Long term (current) use of anticoagulants: Secondary | ICD-10-CM

## 2013-05-22 LAB — PROTIME-INR
INR: 2.5 ratio — ABNORMAL HIGH (ref 0.8–1.0)
Prothrombin Time: 27 s — ABNORMAL HIGH (ref 9.6–13.1)

## 2013-05-29 ENCOUNTER — Other Ambulatory Visit (INDEPENDENT_AMBULATORY_CARE_PROVIDER_SITE_OTHER): Payer: Medicare Other

## 2013-05-29 ENCOUNTER — Encounter: Payer: Self-pay | Admitting: *Deleted

## 2013-05-29 DIAGNOSIS — Z7901 Long term (current) use of anticoagulants: Secondary | ICD-10-CM

## 2013-05-29 LAB — PROTIME-INR
INR: 2 ratio — ABNORMAL HIGH (ref 0.8–1.0)
Prothrombin Time: 22.3 s — ABNORMAL HIGH (ref 9.6–13.1)

## 2013-05-30 ENCOUNTER — Other Ambulatory Visit: Payer: Self-pay | Admitting: Internal Medicine

## 2013-06-05 ENCOUNTER — Encounter: Payer: Self-pay | Admitting: Internal Medicine

## 2013-06-09 ENCOUNTER — Other Ambulatory Visit (INDEPENDENT_AMBULATORY_CARE_PROVIDER_SITE_OTHER): Payer: Medicare Other

## 2013-06-09 DIAGNOSIS — Z7901 Long term (current) use of anticoagulants: Secondary | ICD-10-CM

## 2013-06-09 LAB — PROTIME-INR
INR: 1.7 ratio — ABNORMAL HIGH (ref 0.8–1.0)
Prothrombin Time: 18.4 s — ABNORMAL HIGH (ref 9.6–13.1)

## 2013-06-10 ENCOUNTER — Encounter: Payer: Self-pay | Admitting: *Deleted

## 2013-06-12 NOTE — Telephone Encounter (Signed)
Pt notified by telephone call, has lab appt scheduled for 06/19/13

## 2013-06-19 ENCOUNTER — Other Ambulatory Visit (INDEPENDENT_AMBULATORY_CARE_PROVIDER_SITE_OTHER): Payer: Medicare Other

## 2013-06-19 DIAGNOSIS — Z7901 Long term (current) use of anticoagulants: Secondary | ICD-10-CM

## 2013-06-19 LAB — PROTIME-INR
INR: 2.8 ratio — ABNORMAL HIGH (ref 0.8–1.0)
Prothrombin Time: 30 s — ABNORMAL HIGH (ref 9.6–13.1)

## 2013-06-20 ENCOUNTER — Encounter: Payer: Self-pay | Admitting: *Deleted

## 2013-06-24 ENCOUNTER — Other Ambulatory Visit: Payer: Medicare Other

## 2013-06-24 ENCOUNTER — Ambulatory Visit: Payer: Self-pay | Admitting: Anesthesiology

## 2013-07-03 ENCOUNTER — Other Ambulatory Visit (INDEPENDENT_AMBULATORY_CARE_PROVIDER_SITE_OTHER): Payer: Medicare Other

## 2013-07-03 DIAGNOSIS — Z7901 Long term (current) use of anticoagulants: Secondary | ICD-10-CM

## 2013-07-03 LAB — PROTIME-INR
INR: 2.2 ratio — ABNORMAL HIGH (ref 0.8–1.0)
Prothrombin Time: 24.1 s — ABNORMAL HIGH (ref 9.6–13.1)

## 2013-07-09 ENCOUNTER — Other Ambulatory Visit: Payer: Self-pay | Admitting: Internal Medicine

## 2013-07-14 ENCOUNTER — Telehealth: Payer: Self-pay | Admitting: Internal Medicine

## 2013-07-14 ENCOUNTER — Other Ambulatory Visit (INDEPENDENT_AMBULATORY_CARE_PROVIDER_SITE_OTHER): Payer: Medicare Other

## 2013-07-14 DIAGNOSIS — Z7901 Long term (current) use of anticoagulants: Secondary | ICD-10-CM

## 2013-07-14 DIAGNOSIS — Z86718 Personal history of other venous thrombosis and embolism: Secondary | ICD-10-CM

## 2013-07-14 LAB — PROTIME-INR
INR: 2.6 ratio — ABNORMAL HIGH (ref 0.8–1.0)
Prothrombin Time: 27.9 s — ABNORMAL HIGH (ref 9.6–13.1)

## 2013-07-14 NOTE — Telephone Encounter (Signed)
Patient needs a new prescription called into [facility] losartan blood pressure medication

## 2013-07-14 NOTE — Telephone Encounter (Signed)
This was refilled 07/10/13, confirmed receipt by pharmacy. Left message with pt to check with pharmacy and to let us know if any further problems.

## 2013-07-15 ENCOUNTER — Telehealth: Payer: Self-pay | Admitting: Internal Medicine

## 2013-07-15 ENCOUNTER — Encounter: Payer: Self-pay | Admitting: *Deleted

## 2013-07-15 NOTE — Telephone Encounter (Signed)
The patient is wanting Carolee Rota to give her a call about her medication.

## 2013-07-15 NOTE — Telephone Encounter (Signed)
Left message for pt to return my call. Also, need to report labs, "Pt has my chart, but please call her and inform her to stay on same coumadin dose and recheck pt/inr in 2 weeks." Accidentally cleared result note before completed.

## 2013-07-16 NOTE — Telephone Encounter (Signed)
Late entry, pt returned call yesterday & left voicemail for [place] to return her call. I called pt back & left another voicemail message.

## 2013-07-18 NOTE — Telephone Encounter (Signed)
Left message with results, requested call back to confirm receipt and to sch lab appt.

## 2013-07-23 NOTE — Telephone Encounter (Signed)
Pt has lab appt on 07/29/13

## 2013-07-25 ENCOUNTER — Ambulatory Visit (INDEPENDENT_AMBULATORY_CARE_PROVIDER_SITE_OTHER): Payer: Medicare Other | Admitting: Internal Medicine

## 2013-07-25 ENCOUNTER — Encounter: Payer: Self-pay | Admitting: Internal Medicine

## 2013-07-25 ENCOUNTER — Encounter: Payer: Self-pay | Admitting: *Deleted

## 2013-07-25 VITALS — BP 120/80 | HR 66 | Temp 98.3°F | Ht 64.0 in | Wt 204.0 lb

## 2013-07-25 DIAGNOSIS — J029 Acute pharyngitis, unspecified: Secondary | ICD-10-CM

## 2013-07-25 DIAGNOSIS — Z86718 Personal history of other venous thrombosis and embolism: Secondary | ICD-10-CM

## 2013-07-25 DIAGNOSIS — K146 Glossodynia: Secondary | ICD-10-CM

## 2013-07-25 DIAGNOSIS — Z5181 Encounter for therapeutic drug level monitoring: Secondary | ICD-10-CM

## 2013-07-25 DIAGNOSIS — Z7901 Long term (current) use of anticoagulants: Secondary | ICD-10-CM

## 2013-07-25 LAB — PROTIME-INR
INR: 3.2 ratio — AB (ref 0.8–1.0)
PROTHROMBIN TIME: 34.1 s — AB (ref 9.6–13.1)

## 2013-07-25 MED ORDER — FIRST-DUKES MOUTHWASH MT SUSP: OROMUCOSAL | Status: DC

## 2013-07-25 NOTE — Progress Notes (Signed)
Pre visit review using our clinic review tool, if applicable. No additional management support is needed unless otherwise documented below in the visit note. 

## 2013-07-26 ENCOUNTER — Encounter: Payer: Self-pay | Admitting: Internal Medicine

## 2013-07-26 DIAGNOSIS — K146 Glossodynia: Secondary | ICD-10-CM | POA: Insufficient documentation

## 2013-07-26 NOTE — Assessment & Plan Note (Signed)
Saw Dr Ma Hillock.  Recommended lifelong coumadin.  Check pt/inr today.

## 2013-07-26 NOTE — Progress Notes (Signed)
  Subjective:    [name], female    DOB: Jun 09, 1945, 68 y.o.   MRN: [id number]  HPI 68 year old female with past history of asthma, allergies/bronchitis, hypertension and hypercholesterolemia who comes in today as a work in with concerns regarding tongue and mouth pain and redness. States symptoms started yesterday.  Tongue sore.  Back or mouth sore.  Hurts top of mouth to swallow.  No sore throat.  She does report feeling like she has a lump in her throat.  No fever.  No sinus ore allergy symptoms.  Breathing stable.  Has not been on abx recently.  Not using a steroid inhaler recently.  No acid reflux.      Past Medical History  Diagnosis Date  . Asthma   . Allergy   . Hypercholesterolemia   . Hypertension   . Chicken pox   . History of blood clots     Current Outpatient Prescriptions on File Prior to Visit  Medication Sig Dispense Refill  . albuterol (PROVENTIL HFA) 108 (90 BASE) MCG/ACT inhaler Inhale 2 puffs into the lungs every 6 (six) hours as needed for wheezing.      . citalopram (CELEXA) 20 MG tablet TAKE ONE (1) TABLET EACH DAY  30 tablet  2  . losartan (COZAAR) 50 MG tablet TAKE ONE (1) TABLET EACH DAY  90 tablet  1  . pantoprazole (PROTONIX) 40 MG tablet TAKE ONE (1) TABLET EACH DAY  30 tablet  5  . warfarin (COUMADIN) 5 MG tablet Take 5 mg by mouth daily. Take as directed (As of 07/24/13: Take 5mg  on Tuesday only and 2.5mg  all other days )       No current facility-administered medications on file prior to visit.    Review of Systems No headache,  No sinus or allergy issues.  No sore throat.  Tongue pain and swelling as outlined.  No acid reflux.  No increased cough or congestion.  No recent abx usage.          Objective:   Physical Exam  Filed Vitals:   07/25/13 1034  BP: 120/80  Pulse: 66  Temp: 98.3 F (106.13 C)   68 year old female in no acute distress. HEENT:  Nares- clear.  Oropharynx - erythema - posterior oropharynx.  Tongue red with white  patches.  NECK:  Supple.  Nontender.  HEART:  Appears to be regular. LUNGS:  No crackles or wheezing audible.  Respirations even and unlabored.     Assessment & Plan:  HEALTH MAINTENANCE.  Physical 08/13/12.   Last colonoscopy (per her report) - 2012.  Recommended f/u (per pt) 10 years.  Mammogram scheduled previously.  Rescheduled.

## 2013-07-26 NOTE — Assessment & Plan Note (Signed)
Tongue pain and swelling as outlined.  Erythema posterior oropharynx.  Appears to be consistent with thrush.  Will obtain throat culture.  Dukes magic mouthwash as directed.  Call with update over the next several days.

## 2013-07-27 ENCOUNTER — Encounter: Payer: Self-pay | Admitting: Internal Medicine

## 2013-07-27 LAB — CULTURE, GROUP A STREP: Organism ID, Bacteria: NORMAL

## 2013-07-29 ENCOUNTER — Other Ambulatory Visit: Payer: Medicare Other

## 2013-07-31 ENCOUNTER — Other Ambulatory Visit (INDEPENDENT_AMBULATORY_CARE_PROVIDER_SITE_OTHER): Payer: Medicare Other

## 2013-07-31 ENCOUNTER — Encounter: Payer: Self-pay | Admitting: *Deleted

## 2013-07-31 DIAGNOSIS — Z7901 Long term (current) use of anticoagulants: Secondary | ICD-10-CM

## 2013-07-31 LAB — PROTIME-INR
INR: 2.7 ratio — AB (ref 0.8–1.0)
Prothrombin Time: 28.7 s — ABNORMAL HIGH (ref 9.6–13.1)

## 2013-08-08 ENCOUNTER — Telehealth: Payer: Self-pay | Admitting: *Deleted

## 2013-08-08 NOTE — Telephone Encounter (Signed)
Still have her call us to make sure she does restart on this day.  Thanks.

## 2013-08-08 NOTE — Telephone Encounter (Signed)
Per note, she will be stopping the coumadin as advised by Dr Andree Elk.  Notify her to call us when she restarts the coumadin and we will then schedule her for a f/u pt/inr.

## 2013-08-08 NOTE — Telephone Encounter (Signed)
Pt left voicemail to let us know that her procedure is next Tuesday with Dr. Andree Elk at the pain clinic. She will be stopping her Coumadin tonight & will be restarting on Tuesday. Has an appt next Thursday here. Please call patient if we need to may any changes to medication, dosing or appointment.

## 2013-08-08 NOTE — Telephone Encounter (Signed)
Pt.notified

## 2013-08-08 NOTE — Telephone Encounter (Signed)
Per pat, she will restart Coumadin on Tuesday

## 2013-08-12 ENCOUNTER — Ambulatory Visit: Payer: Self-pay | Admitting: Anesthesiology

## 2013-08-13 ENCOUNTER — Telehealth: Payer: Self-pay | Admitting: *Deleted

## 2013-08-13 NOTE — Telephone Encounter (Signed)
Had procedure done on Tuesday 7/14 & started back on Coumadin last night (took 5mg ). Please advise

## 2013-08-13 NOTE — Telephone Encounter (Signed)
Since she just restarted, have her maintain her dose of coumadin that she was doing prior to stopping and recheck pt/inr in 10 days.

## 2013-08-13 NOTE — Telephone Encounter (Signed)
Pt notified & lab appt scheduled for: 08/22/13 (Fri) @ 10:30 AM. ([facility] lab for 7/16)

## 2013-08-14 ENCOUNTER — Other Ambulatory Visit: Payer: Medicare Other

## 2013-08-22 ENCOUNTER — Other Ambulatory Visit: Payer: Medicare Other

## 2013-08-25 ENCOUNTER — Other Ambulatory Visit (INDEPENDENT_AMBULATORY_CARE_PROVIDER_SITE_OTHER): Payer: Medicare Other

## 2013-08-25 DIAGNOSIS — Z7901 Long term (current) use of anticoagulants: Secondary | ICD-10-CM

## 2013-08-25 LAB — PROTIME-INR
INR: 1.8 ratio — AB (ref 0.8–1.0)
Prothrombin Time: 19.4 s — ABNORMAL HIGH (ref 9.6–13.1)

## 2013-08-26 ENCOUNTER — Other Ambulatory Visit: Payer: Self-pay | Admitting: Internal Medicine

## 2013-08-26 ENCOUNTER — Encounter: Payer: Self-pay | Admitting: *Deleted

## 2013-08-26 NOTE — Telephone Encounter (Signed)
Refilled celexa #30 with 3 refills.

## 2013-08-26 NOTE — Telephone Encounter (Signed)
Last refill 6.23.15, last OV 6.26.15, next OV 8.4.15.  Please advise refill

## 2013-08-28 NOTE — Telephone Encounter (Signed)
Pt notified of lab results & appt. She is currently taking 5mg  on Tuesday & 2.5mg  all other days.

## 2013-09-02 ENCOUNTER — Other Ambulatory Visit (INDEPENDENT_AMBULATORY_CARE_PROVIDER_SITE_OTHER): Payer: Medicare Other

## 2013-09-02 DIAGNOSIS — Z7901 Long term (current) use of anticoagulants: Secondary | ICD-10-CM

## 2013-09-02 LAB — PROTIME-INR
INR: 1.8 ratio — ABNORMAL HIGH (ref 0.8–1.0)
Prothrombin Time: 19.3 s — ABNORMAL HIGH (ref 9.6–13.1)

## 2013-09-03 ENCOUNTER — Encounter: Payer: Self-pay | Admitting: *Deleted

## 2013-09-04 ENCOUNTER — Ambulatory Visit (INDEPENDENT_AMBULATORY_CARE_PROVIDER_SITE_OTHER): Payer: Medicare Other | Admitting: Internal Medicine

## 2013-09-04 ENCOUNTER — Encounter: Payer: Self-pay | Admitting: Internal Medicine

## 2013-09-04 VITALS — BP 110/80 | HR 64 | Temp 98.0°F | Ht 64.0 in | Wt 203.5 lb

## 2013-09-04 DIAGNOSIS — Z733 Stress, not elsewhere classified: Secondary | ICD-10-CM

## 2013-09-04 DIAGNOSIS — R197 Diarrhea, unspecified: Secondary | ICD-10-CM

## 2013-09-04 DIAGNOSIS — F439 Reaction to severe stress, unspecified: Secondary | ICD-10-CM

## 2013-09-04 MED ORDER — CLONAZEPAM 0.5 MG PO TABS: ORAL_TABLET | ORAL | Status: DC

## 2013-09-04 NOTE — Progress Notes (Signed)
Pre visit review using our clinic review tool, if applicable. No additional management support is needed unless otherwise documented below in the visit note. 

## 2013-09-04 NOTE — Patient Instructions (Signed)
[facility] colon health daily or align daily.    Kaopectate after each loose stool.  Do not exceed 6 doses in a 24 hour period.

## 2013-09-07 ENCOUNTER — Encounter: Payer: Self-pay | Admitting: Internal Medicine

## 2013-09-07 DIAGNOSIS — F439 Reaction to severe stress, unspecified: Secondary | ICD-10-CM | POA: Insufficient documentation

## 2013-09-07 NOTE — Assessment & Plan Note (Signed)
Increased stress as outlined.  Has lost her home.  Living with her grandson.  Her son is living in his truck.  Discussed at length with her today.  Continue citalopram.  Has problems sleeping.  She feels this would help.  Unable to use trazodone secondary to being on citalopram.  Will try low dose clonazepam as directed.  Follow closely.  No suicidal thoughts.  Follow.

## 2013-09-07 NOTE — Assessment & Plan Note (Signed)
Persistent diarrhea.  Multiple episodes per day.  No blood.  Symptoms as outlined.  Eating and drinking.  Check stool for wbc's, o&p, culture and c. Diff.  Kaopectate as directed, not to exceed 6 doses in a 24 hour period.  Discussed fiber and staying hydrated.  Follow.  On citalopram.

## 2013-09-07 NOTE — Progress Notes (Signed)
  Subjective:    [name], female    DOB: Jun 30, 1945, 68 y.o.   MRN: [id number]  Diarrhea   68 year old female with past history of asthma, allergies/bronchitis, hypertension and hypercholesterolemia who comes in today as a work in with concerns regarding persistent diarrhea.  She states her bowels have been loose for the last several weeks.  Multiple episodes of loose stool per day.  No blood.  No abdominal pain.  No fever or chills.  She is eating and drinking.  No acid reflux.   No one in her house has been sick.     Past Medical History  Diagnosis Date  . Asthma   . Allergy   . Hypercholesterolemia   . Hypertension   . Chicken pox   . History of blood clots     Current Outpatient Prescriptions on File Prior to Visit  Medication Sig Dispense Refill  . albuterol (PROVENTIL HFA) 108 (90 BASE) MCG/ACT inhaler Inhale 2 puffs into the lungs every 6 (six) hours as needed for wheezing.      . citalopram (CELEXA) 20 MG tablet TAKE ONE (1) TABLET EACH DAY  30 tablet  3  . losartan (COZAAR) 50 MG tablet TAKE ONE (1) TABLET EACH DAY  90 tablet  1  . pantoprazole (PROTONIX) 40 MG tablet TAKE ONE (1) TABLET EACH DAY  30 tablet  5  . warfarin (COUMADIN) 5 MG tablet Take 5 mg by mouth daily. Take as directed (As of 09/04/13: Take 5mg  on Tuesday & Saturday and 2.5mg  all other days)       No current facility-administered medications on file prior to visit.    Review of Systems  Gastrointestinal: Positive for diarrhea.  No headache,  No sinus or allergy issues.  No sore throat.  No acid reflux.  No increased cough or congestion.  Was on abx a few months ago.  No nausea or vomiting.  No abdominal pain or cramping.  Multiple episodes of loose stool as outlined.  No blood.  Increased stress.  Feels may be contributing some to her bowel change.          Objective:   Physical Exam  Filed Vitals:   09/04/13 1008  BP: 110/80  Pulse: 64  Temp: 98 F (18.74 C)   68 year old female in  no acute distress.   HEENT:  Nares- clear.  Oropharynx - without lesions. NECK:  Supple.  Nontender.  No audible bruit.  HEART:  Appears to be regular. LUNGS:  No crackles or wheezing audible.  Respirations even and unlabored.  RADIAL PULSE:  Equal bilaterally.  ABDOMEN:  Soft, nontender.  Bowel sounds present and normal.  No audible abdominal bruit.         Assessment & Plan:  HEALTH MAINTENANCE.  Physical 08/13/12.   Last colonoscopy (per her report) - 2012.  Recommended f/u (per pt) 10 years.  Mammogram scheduled previously.  Rescheduled last visit.

## 2013-09-12 ENCOUNTER — Encounter: Payer: Self-pay | Admitting: *Deleted

## 2013-09-12 ENCOUNTER — Telehealth: Payer: Self-pay | Admitting: *Deleted

## 2013-09-12 NOTE — Telephone Encounter (Signed)
Pt notified via mychart & responded. Message sent to Dr. Nicki Reaper

## 2013-09-12 NOTE — Telephone Encounter (Signed)
[facility] lab called. Received a specimen from patient, however patient was not registered, forms were not completed correctly (missing 2 pt identifiers), nothing was checked on the order form & she couldn't tell who the provider was. She states that this specimen will need to be recollected & form will need to be completed correctly, & pt will need to go to registration desk before dropping off specimen.

## 2013-09-12 NOTE — Telephone Encounter (Signed)
Please call pt and notify her that lab reports form not completed properly so will need to repeat the test.  Is she still having diarrhea?  If so, will need to start over with collection and I can see if we can just run it here if need be.  I apologize.

## 2013-09-15 ENCOUNTER — Other Ambulatory Visit (INDEPENDENT_AMBULATORY_CARE_PROVIDER_SITE_OTHER): Payer: Medicare Other

## 2013-09-15 ENCOUNTER — Encounter: Payer: Self-pay | Admitting: *Deleted

## 2013-09-15 DIAGNOSIS — Z7901 Long term (current) use of anticoagulants: Secondary | ICD-10-CM

## 2013-09-15 LAB — PROTIME-INR
INR: 3.7 ratio — AB (ref 0.8–1.0)
Prothrombin Time: 39.1 s — ABNORMAL HIGH (ref 9.6–13.1)

## 2013-09-16 ENCOUNTER — Other Ambulatory Visit: Payer: Self-pay | Admitting: Internal Medicine

## 2013-09-16 ENCOUNTER — Encounter: Payer: Self-pay | Admitting: *Deleted

## 2013-09-16 ENCOUNTER — Other Ambulatory Visit (INDEPENDENT_AMBULATORY_CARE_PROVIDER_SITE_OTHER): Payer: Medicare Other

## 2013-09-16 DIAGNOSIS — Z7901 Long term (current) use of anticoagulants: Secondary | ICD-10-CM

## 2013-09-16 LAB — PROTIME-INR
INR: 3.4 ratio — ABNORMAL HIGH (ref 0.8–1.0)
Prothrombin Time: 36.3 s — ABNORMAL HIGH (ref 9.6–13.1)

## 2013-09-16 NOTE — Progress Notes (Signed)
Order placed for f/u pt/inr 

## 2013-09-19 ENCOUNTER — Other Ambulatory Visit (INDEPENDENT_AMBULATORY_CARE_PROVIDER_SITE_OTHER): Payer: Medicare Other

## 2013-09-19 DIAGNOSIS — Z7901 Long term (current) use of anticoagulants: Secondary | ICD-10-CM

## 2013-09-20 LAB — PROTIME-INR
INR: 1.6 ratio — AB (ref 0.8–1.0)
PROTHROMBIN TIME: 17.3 s — AB (ref 9.6–13.1)

## 2013-09-21 ENCOUNTER — Encounter: Payer: Self-pay | Admitting: Internal Medicine

## 2013-10-02 ENCOUNTER — Other Ambulatory Visit (INDEPENDENT_AMBULATORY_CARE_PROVIDER_SITE_OTHER): Payer: Medicare Other

## 2013-10-02 DIAGNOSIS — Z7901 Long term (current) use of anticoagulants: Secondary | ICD-10-CM

## 2013-10-02 LAB — PROTIME-INR
INR: 2 ratio — ABNORMAL HIGH (ref 0.8–1.0)
PROTHROMBIN TIME: 21.8 s — AB (ref 9.6–13.1)

## 2013-10-03 ENCOUNTER — Encounter: Payer: Self-pay | Admitting: *Deleted

## 2013-10-03 ENCOUNTER — Encounter: Payer: Self-pay | Admitting: Internal Medicine

## 2013-10-03 ENCOUNTER — Other Ambulatory Visit: Payer: Self-pay | Admitting: Internal Medicine

## 2013-10-03 DIAGNOSIS — Z7901 Long term (current) use of anticoagulants: Secondary | ICD-10-CM

## 2013-10-03 NOTE — Progress Notes (Signed)
Order placed for pt/inr 

## 2013-10-13 ENCOUNTER — Other Ambulatory Visit (INDEPENDENT_AMBULATORY_CARE_PROVIDER_SITE_OTHER): Payer: Medicare Other

## 2013-10-13 DIAGNOSIS — Z7901 Long term (current) use of anticoagulants: Secondary | ICD-10-CM

## 2013-10-13 LAB — PROTIME-INR
INR: 3 ratio — ABNORMAL HIGH (ref 0.8–1.0)
Prothrombin Time: 31.8 s — ABNORMAL HIGH (ref 9.6–13.1)

## 2013-10-14 ENCOUNTER — Encounter: Payer: Self-pay | Admitting: *Deleted

## 2013-10-16 ENCOUNTER — Telehealth: Payer: Self-pay | Admitting: Internal Medicine

## 2013-10-16 ENCOUNTER — Encounter: Payer: Self-pay | Admitting: *Deleted

## 2013-10-16 ENCOUNTER — Other Ambulatory Visit (INDEPENDENT_AMBULATORY_CARE_PROVIDER_SITE_OTHER): Payer: Medicare Other

## 2013-10-16 DIAGNOSIS — Z7901 Long term (current) use of anticoagulants: Secondary | ICD-10-CM

## 2013-10-16 LAB — PROTIME-INR
INR: 3.2 ratio — ABNORMAL HIGH (ref 0.8–1.0)
Prothrombin Time: 33.9 s — ABNORMAL HIGH (ref 9.6–13.1)

## 2013-10-16 NOTE — Telephone Encounter (Signed)
Pt has appt at pain clinic on 9/23 and wanted Dr. Nicki Reaper to know that she will be stopping her coumadin on the 9/18.msn

## 2013-10-16 NOTE — Telephone Encounter (Signed)
INR is slightly increased.  She is stopping her coumadin anyway.  Have her call us and let us know when she restarts her coumadin and then we will restart and plan for f/u lab.

## 2013-10-16 NOTE — Telephone Encounter (Signed)
Sent mychart message

## 2013-10-16 NOTE — Telephone Encounter (Signed)
FYI

## 2013-10-22 ENCOUNTER — Ambulatory Visit: Payer: Self-pay | Admitting: Anesthesiology

## 2013-11-03 ENCOUNTER — Encounter: Payer: Self-pay | Admitting: *Deleted

## 2013-11-03 ENCOUNTER — Other Ambulatory Visit (INDEPENDENT_AMBULATORY_CARE_PROVIDER_SITE_OTHER): Payer: Medicare Other

## 2013-11-03 DIAGNOSIS — Z5181 Encounter for therapeutic drug level monitoring: Secondary | ICD-10-CM

## 2013-11-03 DIAGNOSIS — Z7901 Long term (current) use of anticoagulants: Secondary | ICD-10-CM

## 2013-11-03 LAB — PROTIME-INR
INR: 1.2 ratio — AB (ref 0.8–1.0)
PROTHROMBIN TIME: 13 s (ref 9.6–13.1)

## 2013-11-06 ENCOUNTER — Other Ambulatory Visit (INDEPENDENT_AMBULATORY_CARE_PROVIDER_SITE_OTHER): Payer: Medicare Other

## 2013-11-06 DIAGNOSIS — Z7901 Long term (current) use of anticoagulants: Secondary | ICD-10-CM

## 2013-11-06 LAB — PROTIME-INR
INR: 1.7 ratio — AB (ref 0.8–1.0)
PROTHROMBIN TIME: 18.3 s — AB (ref 9.6–13.1)

## 2013-11-07 ENCOUNTER — Encounter: Payer: Self-pay | Admitting: Internal Medicine

## 2013-11-07 ENCOUNTER — Encounter: Payer: Self-pay | Admitting: *Deleted

## 2013-11-07 ENCOUNTER — Other Ambulatory Visit: Payer: Self-pay | Admitting: Internal Medicine

## 2013-11-07 DIAGNOSIS — Z7901 Long term (current) use of anticoagulants: Secondary | ICD-10-CM

## 2013-11-07 NOTE — Progress Notes (Signed)
Order placed for f/u pt/inr 

## 2013-11-12 ENCOUNTER — Telehealth: Payer: Self-pay

## 2013-11-12 NOTE — Telephone Encounter (Signed)
The patient called stating she fell on her steps Monday evening.  She states she has a large bruise on the back of her right leg.  She wants to know exactly what she should do to handle this.  Do you want her worked in?   The patient stated the best way to contact her is through [place] or by calling her son - [phone] (son)

## 2013-11-13 ENCOUNTER — Encounter: Payer: Self-pay | Admitting: Internal Medicine

## 2013-11-13 ENCOUNTER — Ambulatory Visit (INDEPENDENT_AMBULATORY_CARE_PROVIDER_SITE_OTHER): Payer: Medicare Other | Admitting: Internal Medicine

## 2013-11-13 ENCOUNTER — Encounter: Payer: Self-pay | Admitting: *Deleted

## 2013-11-13 ENCOUNTER — Other Ambulatory Visit: Payer: Medicare Other

## 2013-11-13 VITALS — BP 110/70 | HR 74 | Temp 98.2°F | Ht 64.0 in | Wt 213.2 lb

## 2013-11-13 DIAGNOSIS — W19XXXA Unspecified fall, initial encounter: Secondary | ICD-10-CM

## 2013-11-13 DIAGNOSIS — Z7901 Long term (current) use of anticoagulants: Secondary | ICD-10-CM

## 2013-11-13 DIAGNOSIS — Z5181 Encounter for therapeutic drug level monitoring: Secondary | ICD-10-CM

## 2013-11-13 DIAGNOSIS — F439 Reaction to severe stress, unspecified: Secondary | ICD-10-CM

## 2013-11-13 DIAGNOSIS — Z658 Other specified problems related to psychosocial circumstances: Secondary | ICD-10-CM

## 2013-11-13 DIAGNOSIS — T148 Other injury of unspecified body region: Secondary | ICD-10-CM

## 2013-11-13 DIAGNOSIS — T148XXA Other injury of unspecified body region, initial encounter: Secondary | ICD-10-CM

## 2013-11-13 LAB — CBC WITH DIFFERENTIAL/PLATELET
BASOS PCT: 0.3 % (ref 0.0–3.0)
Basophils Absolute: 0 10*3/uL (ref 0.0–0.1)
EOS PCT: 4.4 % (ref 0.0–5.0)
Eosinophils Absolute: 0.3 10*3/uL (ref 0.0–0.7)
HCT: 40.5 % (ref 36.0–46.0)
Hemoglobin: 13.4 g/dL (ref 12.0–15.0)
LYMPHS PCT: 38 % (ref 12.0–46.0)
Lymphs Abs: 2.8 10*3/uL (ref 0.7–4.0)
MCHC: 33.2 g/dL (ref 30.0–36.0)
MCV: 88.6 fl (ref 78.0–100.0)
MONO ABS: 0.4 10*3/uL (ref 0.1–1.0)
MONOS PCT: 5.7 % (ref 3.0–12.0)
Neutro Abs: 3.7 10*3/uL (ref 1.4–7.7)
Neutrophils Relative %: 51.6 % (ref 43.0–77.0)
PLATELETS: 282 10*3/uL (ref 150.0–400.0)
RBC: 4.57 Mil/uL (ref 3.87–5.11)
RDW: 13.8 % (ref 11.5–15.5)
WBC: 7.3 10*3/uL (ref 4.0–10.5)

## 2013-11-13 LAB — PROTIME-INR
INR: 2.3 ratio — ABNORMAL HIGH (ref 0.8–1.0)
Prothrombin Time: 25.4 s — ABNORMAL HIGH (ref 9.6–13.1)

## 2013-11-13 NOTE — Telephone Encounter (Signed)
I can work her in today at 1:15.  Please call her son and see if you can get him - since short notice.  Thanks.  If unable to come today, let me know.

## 2013-11-13 NOTE — Progress Notes (Signed)
Pre visit review using our clinic review tool, if applicable. No additional management support is needed unless otherwise documented below in the visit note. 

## 2013-11-13 NOTE — Telephone Encounter (Signed)
Tried to contact son-voicemail not set up. Send patient a Pharmacist, community message also

## 2013-11-13 NOTE — Telephone Encounter (Signed)
Please advise 

## 2013-11-13 NOTE — Telephone Encounter (Signed)
Pt called back & coming in at 1:15

## 2013-11-14 ENCOUNTER — Encounter: Payer: Self-pay | Admitting: Internal Medicine

## 2013-11-17 NOTE — Telephone Encounter (Signed)
Unread mychart message mailed to patient 

## 2013-11-22 ENCOUNTER — Encounter: Payer: Self-pay | Admitting: Internal Medicine

## 2013-11-22 DIAGNOSIS — T148XXA Other injury of unspecified body region, initial encounter: Secondary | ICD-10-CM | POA: Insufficient documentation

## 2013-11-22 NOTE — Assessment & Plan Note (Signed)
Bruising of right leg s/p fall.  Symptoms and exam as outlined.  Conservative measures.  Do not feel xray warranted.  Check cbc and pt/inr.  No abdominal pain.  Follow closely.

## 2013-11-22 NOTE — Assessment & Plan Note (Signed)
Discussed with her today.  Doing better.  Follow.   

## 2013-11-22 NOTE — Progress Notes (Signed)
  Subjective:    [name], female    DOB: 13-Mar-1945, 68 y.o.   MRN: [id number]  Fall  68 year old female with past history of asthma, allergies/bronchitis, hypertension and hypercholesterolemia who comes in today as a work in with concerns regarding some bruising after a fall.  [place] a few days ago.  Bruised her left elbow and right leg.  She fell going up the stairs.  No LOC.  Also reports some neck, shoulders and rib discomfort.  No sob.  No abdominal pain or cramping.  No blood in urine.       Past Medical History  Diagnosis Date  . Asthma   . Allergy   . Hypercholesterolemia   . Hypertension   . Chicken pox   . History of blood clots     Current Outpatient Prescriptions on File Prior to Visit  Medication Sig Dispense Refill  . albuterol (PROVENTIL HFA) 108 (90 BASE) MCG/ACT inhaler Inhale 2 puffs into the lungs every 6 (six) hours as needed for wheezing.      . citalopram (CELEXA) 20 MG tablet TAKE ONE (1) TABLET EACH DAY  30 tablet  3  . losartan (COZAAR) 50 MG tablet TAKE ONE (1) TABLET EACH DAY  90 tablet  1  . pantoprazole (PROTONIX) 40 MG tablet TAKE ONE (1) TABLET EACH DAY  30 tablet  5  . warfarin (COUMADIN) 5 MG tablet Take 5 mg by mouth daily. Take as directed (As of 09/04/13: Take 5mg  on Tuesday & Saturday and 2.5mg  all other days)       No current facility-administered medications on file prior to visit.    Review of Systems No headache.  No sob.  Some pain s/p fall.  No abdominal pain.  No blood in urine.  Some bruising over right leg.           Objective:   Physical Exam  Filed Vitals:   11/13/13 1319  BP: 110/70  Pulse: 74  Temp: 98.2 F (78.77 C)   68 year old female in no acute distress.  NECK:  Supple.  Nontender.  HEART:  Appears to be regular. LUNGS:  No crackles or wheezing audible.  Respirations even and unlabored.  Good breath sounds bilaterally.   ABDOMEN:  Soft.  Non tender.  No audible abdominal bruit.   EXTREMITIES:  Some  bruising.  No increased swelling or warmth.       Assessment & Plan:  HEALTH MAINTENANCE.  Physical 08/13/12.   Last colonoscopy (per her report) - 2012.  Recommended f/u (per pt) 10 years.  Mammogram scheduled previously.  Rescheduled.   Problem List Items Addressed This Visit   Bruise     Bruising of right leg s/p fall.  Symptoms and exam as outlined.  Conservative measures.  Do not feel xray warranted.  Check cbc and pt/inr.  No abdominal pain.  Follow closely.       Stress     Discussed with her today.   Doing better.  Follow.       Other Visit Diagnoses   Anticoagulated on Coumadin    -  Primary    Relevant Orders       CBC with Differential (Completed)       Protime-INR (Completed)    Fall, initial encounter        Relevant Orders       CBC with Differential (Completed)

## 2013-11-25 ENCOUNTER — Ambulatory Visit: Payer: Self-pay | Admitting: Anesthesiology

## 2013-11-26 ENCOUNTER — Telehealth: Payer: Self-pay

## 2013-11-26 NOTE — Telephone Encounter (Signed)
The patient wanted the office to be aware she took 5mg  of coumadin last night (11/25/13)

## 2013-11-26 NOTE — Telephone Encounter (Signed)
Yes you are correct, pt is starting back since she has been off of the coumadin for 10 days. Advised pt to continue the same dose as previous and recheck pt/inr in one week. Pt will schedule a lab appt for wed of next week.

## 2013-11-26 NOTE — Telephone Encounter (Signed)
I think this means that she had her procedure and is going back on her coumadin.  Please confirm.  If correct, then continue same coumadin dose as previous and check pt/inr in one week.

## 2013-11-28 NOTE — Telephone Encounter (Signed)
Called pt, pt is scheduled for a pt/inr check for Wednesday at 2pm.

## 2013-12-03 ENCOUNTER — Telehealth: Payer: Self-pay | Admitting: Internal Medicine

## 2013-12-03 ENCOUNTER — Other Ambulatory Visit (INDEPENDENT_AMBULATORY_CARE_PROVIDER_SITE_OTHER): Payer: Medicare Other

## 2013-12-03 DIAGNOSIS — I82409 Acute embolism and thrombosis of unspecified deep veins of unspecified lower extremity: Secondary | ICD-10-CM

## 2013-12-03 DIAGNOSIS — Z5181 Encounter for therapeutic drug level monitoring: Secondary | ICD-10-CM

## 2013-12-03 LAB — PROTIME-INR
INR: 1.3 ratio — AB (ref 0.8–1.0)
Prothrombin Time: 14 s — ABNORMAL HIGH (ref 9.6–13.1)

## 2013-12-03 NOTE — Telephone Encounter (Signed)
The pt said her email isn't working and she'd like to receive a phone call when her blood work results are in. Pt ph# [phone] Thank you.

## 2013-12-04 ENCOUNTER — Encounter: Payer: Self-pay | Admitting: *Deleted

## 2013-12-04 NOTE — Telephone Encounter (Signed)
noted 

## 2013-12-10 ENCOUNTER — Other Ambulatory Visit: Payer: Medicare Other

## 2013-12-11 ENCOUNTER — Other Ambulatory Visit (INDEPENDENT_AMBULATORY_CARE_PROVIDER_SITE_OTHER): Payer: Medicare Other

## 2013-12-11 ENCOUNTER — Telehealth: Payer: Self-pay | Admitting: Internal Medicine

## 2013-12-11 DIAGNOSIS — Z7901 Long term (current) use of anticoagulants: Secondary | ICD-10-CM

## 2013-12-11 LAB — PROTIME-INR
INR: 1.7 ratio — AB (ref 0.8–1.0)
PROTHROMBIN TIME: 18.3 s — AB (ref 9.6–13.1)

## 2013-12-11 NOTE — Telephone Encounter (Signed)
See below

## 2013-12-11 NOTE — Telephone Encounter (Signed)
Pt needs an appt for swelling legs and feet. Please advise where to add pt to the scheduled.msn

## 2013-12-11 NOTE — Telephone Encounter (Signed)
Pt notified & will call back with an update

## 2013-12-11 NOTE — Telephone Encounter (Signed)
I am unable to work her in today or tomorrow.  With her history of blood clots, I would recommend evaluation today (acute care/urgent care) and confirm no acute problem.  ([facility] is available also)

## 2013-12-12 ENCOUNTER — Encounter: Payer: Self-pay | Admitting: *Deleted

## 2013-12-17 ENCOUNTER — Other Ambulatory Visit: Payer: Self-pay | Admitting: Internal Medicine

## 2013-12-23 ENCOUNTER — Other Ambulatory Visit: Payer: Medicare Other

## 2013-12-23 ENCOUNTER — Ambulatory Visit (INDEPENDENT_AMBULATORY_CARE_PROVIDER_SITE_OTHER): Payer: Medicare Other | Admitting: Internal Medicine

## 2013-12-23 ENCOUNTER — Encounter: Payer: Self-pay | Admitting: Internal Medicine

## 2013-12-23 VITALS — BP 142/70 | HR 94 | Temp 98.1°F | Ht 64.0 in | Wt 215.8 lb

## 2013-12-23 DIAGNOSIS — I82602 Acute embolism and thrombosis of unspecified veins of left upper extremity: Secondary | ICD-10-CM

## 2013-12-23 DIAGNOSIS — Z5181 Encounter for therapeutic drug level monitoring: Secondary | ICD-10-CM

## 2013-12-23 DIAGNOSIS — F439 Reaction to severe stress, unspecified: Secondary | ICD-10-CM

## 2013-12-23 DIAGNOSIS — M5442 Lumbago with sciatica, left side: Secondary | ICD-10-CM

## 2013-12-23 DIAGNOSIS — Z658 Other specified problems related to psychosocial circumstances: Secondary | ICD-10-CM

## 2013-12-23 DIAGNOSIS — Z7901 Long term (current) use of anticoagulants: Secondary | ICD-10-CM

## 2013-12-23 LAB — PROTIME-INR
INR: 1.9 ratio — ABNORMAL HIGH (ref 0.8–1.0)
Prothrombin Time: 21.1 s — ABNORMAL HIGH (ref 9.6–13.1)

## 2013-12-23 NOTE — Progress Notes (Signed)
Pre visit review using our clinic review tool, if applicable. No additional management support is needed unless otherwise documented below in the visit note. 

## 2013-12-23 NOTE — Patient Instructions (Signed)
Tylenol extra strength - can take two tablets twice a day.

## 2013-12-27 ENCOUNTER — Encounter: Payer: Self-pay | Admitting: Internal Medicine

## 2013-12-27 NOTE — Progress Notes (Signed)
Subjective:    [name], female    DOB: 28-Jan-1946, 68 y.o.   MRN: [id number]  Back Pain  68 year old female with past history of asthma, allergies/bronchitis, hypertension and hypercholesterolemia who comes in today as a work in with concerns regarding increased back pain.   She has been having issues with her back  Seeing Dr Andree Elk.  States the last injection did not help.  Describes low back pain and pain that radiates to her left hip.  Also will notice pain extending into her left leg to her ankle.     Past Medical History  Diagnosis Date  . Asthma   . Allergy   . Hypercholesterolemia   . Hypertension   . Chicken pox   . History of blood clots     Current Outpatient Prescriptions on File Prior to Visit  Medication Sig Dispense Refill  . albuterol (PROVENTIL HFA) 108 (90 BASE) MCG/ACT inhaler Inhale 2 puffs into the lungs every 6 (six) hours as needed for wheezing.    . citalopram (CELEXA) 20 MG tablet TAKE ONE (1) TABLET EACH DAY 30 tablet 3  . losartan (COZAAR) 50 MG tablet TAKE ONE (1) TABLET EACH DAY 90 tablet 1  . pantoprazole (PROTONIX) 40 MG tablet TAKE ONE (1) TABLET EACH DAY 30 tablet 5  . warfarin (COUMADIN) 5 MG tablet TAKE ONE (1) TABLET EACH DAY (Patient taking differently: TAKE ONE (1) TABLET ON TUES, THURS,  SUNDAY. TAKE 1/2 TABLET ALL OTHER DAYS) 30 tablet 5   No current facility-administered medications on file prior to visit.    Review of Systems  Musculoskeletal: Positive for back pain.  No acid reflux.  Increased low back pain and pain extending into her left hip and into her left ankle.  No numbness or tingling.  Increased pain.  Needs something to help control the pain.  No abdominal pain.  Eating and drinking.          Objective:   Physical Exam  Filed Vitals:   12/23/13 0955  BP: 142/70  Pulse: 94  Temp: 98.1 F (61.60 C)   68 year old female in no acute distress.   HEENT:  Nares- clear.  Oropharynx - without lesions. NECK:   Supple.  Nontender.   HEART:  Appears to be regular. LUNGS:  No crackles or wheezing audible.  Respirations even and unlabored. ABDOMEN:  Soft, nontender.  Bowel sounds present and normal.  No audible abdominal bruit.    EXTREMITIES:  No increased edema present.  DP pulses palpable and equal bilaterally.      BACK:  Non tender to palpation.  Some increased pain with straight leg raise.  No pain in the groin with abduction.  Increased pain with going from sitting to standing position.  Increased pain with weight bearing.       Assessment & Plan:  1. Anticoagulated on Coumadin - Protime-INR  2. Left-sided low back pain with left-sided sciatica Increased pain and pain extending into the left hip and leg.  Seeing Dr Andree Elk.  Needs to f/u with Dr Andree Elk.  Medrol dose pack - 6 day taper.  Follow.    3. Stress Increased stress as outlined.  Feels she is handling things relatively well.  Follow.    4. Arm vein blood clot, left On coumadin.  Check pt/inr.    HEALTH MAINTENANCE.  Physical 08/13/12.   Last colonoscopy (per her report) - 2012.  Recommended f/u (per pt) 10 years.  Mammogram  scheduled previously.  Rescheduled.

## 2013-12-28 DIAGNOSIS — M549 Dorsalgia, unspecified: Secondary | ICD-10-CM | POA: Insufficient documentation

## 2013-12-28 DIAGNOSIS — M545 Low back pain, unspecified: Secondary | ICD-10-CM | POA: Insufficient documentation

## 2013-12-29 ENCOUNTER — Telehealth: Payer: Self-pay | Admitting: *Deleted

## 2013-12-29 NOTE — Telephone Encounter (Signed)
Pt called today & reported that she forgot to call last week for an update. Her feet is swollen & hip pain better. Pt also states that her back is also improving some on the steroid.

## 2013-12-29 NOTE — Telephone Encounter (Signed)
Thank you for the update.  Did she call and inform Dr Andree Elk needs earlier appt?  If no, needs to call.  Let me know if any problems.

## 2013-12-29 NOTE — Telephone Encounter (Signed)
Pt states that she will call Dr. Quita Skye

## 2014-01-01 ENCOUNTER — Other Ambulatory Visit (INDEPENDENT_AMBULATORY_CARE_PROVIDER_SITE_OTHER): Payer: Medicare Other

## 2014-01-01 DIAGNOSIS — Z7901 Long term (current) use of anticoagulants: Secondary | ICD-10-CM

## 2014-01-01 DIAGNOSIS — Z5181 Encounter for therapeutic drug level monitoring: Secondary | ICD-10-CM

## 2014-01-01 LAB — PROTIME-INR
INR: 3.1 ratio — ABNORMAL HIGH (ref 0.8–1.0)
Prothrombin Time: 33.7 s — ABNORMAL HIGH (ref 9.6–13.1)

## 2014-01-02 ENCOUNTER — Other Ambulatory Visit: Payer: Self-pay | Admitting: Internal Medicine

## 2014-01-02 ENCOUNTER — Telehealth: Payer: Self-pay | Admitting: *Deleted

## 2014-01-02 NOTE — Telephone Encounter (Signed)
At the last visit, it was better.  We discussed leg elevation, etc.  If she is still having problems, I would recommend evaluation.  Since Friday pm, I am unable to see her today.  Recommend acute care or [facility] urgent care.

## 2014-01-02 NOTE — Telephone Encounter (Signed)
Pt wanted to know if you could call something in for her swollen legs & feet. Pt reports they were swollen at her last visit, notices that sweeling is from knees to toes & ongoing for a few days now. Please advise

## 2014-01-05 NOTE — Telephone Encounter (Signed)
Pt states that her swelling has gone down now. She feels that she had been on her feet too much.

## 2014-01-06 ENCOUNTER — Other Ambulatory Visit (INDEPENDENT_AMBULATORY_CARE_PROVIDER_SITE_OTHER): Payer: Medicare Other

## 2014-01-06 DIAGNOSIS — Z7901 Long term (current) use of anticoagulants: Secondary | ICD-10-CM

## 2014-01-06 LAB — PROTIME-INR
INR: 2.2 ratio — AB (ref 0.8–1.0)
Prothrombin Time: 24.4 s — ABNORMAL HIGH (ref 9.6–13.1)

## 2014-01-07 ENCOUNTER — Ambulatory Visit: Payer: Medicare Other

## 2014-01-07 ENCOUNTER — Ambulatory Visit (INDEPENDENT_AMBULATORY_CARE_PROVIDER_SITE_OTHER): Payer: Medicare Other | Admitting: *Deleted

## 2014-01-07 DIAGNOSIS — Z23 Encounter for immunization: Secondary | ICD-10-CM

## 2014-01-14 ENCOUNTER — Other Ambulatory Visit: Payer: Self-pay | Admitting: Internal Medicine

## 2014-01-14 ENCOUNTER — Other Ambulatory Visit (INDEPENDENT_AMBULATORY_CARE_PROVIDER_SITE_OTHER): Payer: Medicare Other

## 2014-01-14 DIAGNOSIS — Z7901 Long term (current) use of anticoagulants: Secondary | ICD-10-CM

## 2014-01-14 LAB — PROTIME-INR
INR: 1.8 ratio — AB (ref 0.8–1.0)
PROTHROMBIN TIME: 20.1 s — AB (ref 9.6–13.1)

## 2014-01-26 ENCOUNTER — Other Ambulatory Visit (INDEPENDENT_AMBULATORY_CARE_PROVIDER_SITE_OTHER): Payer: Medicare Other

## 2014-01-26 DIAGNOSIS — Z7901 Long term (current) use of anticoagulants: Secondary | ICD-10-CM

## 2014-01-26 LAB — PROTIME-INR
INR: 2.3 ratio — ABNORMAL HIGH (ref 0.8–1.0)
Prothrombin Time: 24.5 s — ABNORMAL HIGH (ref 9.6–13.1)

## 2014-02-04 ENCOUNTER — Other Ambulatory Visit (INDEPENDENT_AMBULATORY_CARE_PROVIDER_SITE_OTHER): Payer: Medicare Other

## 2014-02-04 DIAGNOSIS — Z7901 Long term (current) use of anticoagulants: Secondary | ICD-10-CM

## 2014-02-04 LAB — PROTIME-INR
INR: 2.7 ratio — AB (ref 0.8–1.0)
PROTHROMBIN TIME: 28.8 s — AB (ref 9.6–13.1)

## 2014-02-11 ENCOUNTER — Telehealth: Payer: Self-pay | Admitting: Internal Medicine

## 2014-02-11 ENCOUNTER — Other Ambulatory Visit (INDEPENDENT_AMBULATORY_CARE_PROVIDER_SITE_OTHER): Payer: Medicare Other

## 2014-02-11 DIAGNOSIS — Z7901 Long term (current) use of anticoagulants: Secondary | ICD-10-CM

## 2014-02-11 LAB — PROTIME-INR
INR: 2.2 ratio — ABNORMAL HIGH (ref 0.8–1.0)
Prothrombin Time: 24.4 s — ABNORMAL HIGH (ref 9.6–13.1)

## 2014-02-11 NOTE — Telephone Encounter (Signed)
I can see her Friday (02/13/14)  at 12:00 (if no one else has been told to come in then).  She sees Dr Andree Elk for her back.  I last saw her, I was concerned about back and recommended f/u with him regarding pain.  Is this something different.  She may want to give him a call and see if he thinks back related.  May save her two trips.

## 2014-02-11 NOTE — Telephone Encounter (Signed)
Pt request an appt for hurting in her legs and feet. Pt refuse to schedule with NP. Please advise Valda Favia

## 2014-02-12 NOTE — Telephone Encounter (Signed)
Pt notified, appt scheduled. States this is a different issue and does not think it is back related. Has an appt with Dr. Andree Elk for follow up 02/21/14.

## 2014-02-13 ENCOUNTER — Ambulatory Visit (INDEPENDENT_AMBULATORY_CARE_PROVIDER_SITE_OTHER): Payer: Medicare Other | Admitting: Internal Medicine

## 2014-02-13 ENCOUNTER — Encounter: Payer: Self-pay | Admitting: Internal Medicine

## 2014-02-13 VITALS — BP 130/70 | HR 82 | Temp 97.8°F | Ht 64.0 in | Wt 219.8 lb

## 2014-02-13 DIAGNOSIS — M79606 Pain in leg, unspecified: Secondary | ICD-10-CM

## 2014-02-13 NOTE — Progress Notes (Signed)
Pre visit review using our clinic review tool, if applicable. No additional management support is needed unless otherwise documented below in the visit note. 

## 2014-02-16 ENCOUNTER — Encounter: Payer: Self-pay | Admitting: Internal Medicine

## 2014-02-16 DIAGNOSIS — M79606 Pain in leg, unspecified: Secondary | ICD-10-CM | POA: Insufficient documentation

## 2014-02-16 NOTE — Progress Notes (Signed)
   Subjective:    [name], female    DOB: [birth year], 69 y.o.   MRN: [id number]  HPI 69 year old female with past history of asthma, allergies/bronchitis, hypertension and hypercholesterolemia who comes in today as a work in with concerns regarding leg aching.  States this has been going on for a while.  Describes her legs as aching.  States they throb all over.  Some numbness and tingling in her feet and hands.  Some swelling in her feet - bilateral.  She is on her feet a lot.  Stands a lot.  Up and down a lot.  No increased erythema.  No increased warmth.  No injury.     Past Medical History  Diagnosis Date  . Asthma   . Allergy   . Hypercholesterolemia   . Hypertension   . Chicken pox   . History of blood clots     Current Outpatient Prescriptions on File Prior to Visit  Medication Sig Dispense Refill  . albuterol (PROVENTIL HFA) 108 (90 BASE) MCG/ACT inhaler Inhale 2 puffs into the lungs every 6 (six) hours as needed for wheezing.    . citalopram (CELEXA) 20 MG tablet TAKE ONE (1) TABLET EACH DAY 30 tablet 1  . losartan (COZAAR) 50 MG tablet TAKE ONE (1) TABLET BY MOUTH EVERY DAY 90 tablet 1  . pantoprazole (PROTONIX) 40 MG tablet TAKE ONE (1) TABLET EACH DAY 30 tablet 5  . warfarin (COUMADIN) 5 MG tablet TAKE ONE (1) TABLET EACH DAY (Patient taking differently: TAKE ONE (1) TABLET ON TUES, THURS,  SUNDAY. TAKE 1/2 TABLET ALL OTHER DAYS) 30 tablet 5   No current facility-administered medications on file prior to visit.    Review of Systems Patient denies any headache.   No chest pain, tightness or palpitations.  No increased shortness of breath, cough or congestion.  No nausea or vomiting.  No abdominal pain or cramping.  No bowel change, such as diarrhea.   Leg aching and throbbing as outlined.  No injury.  Up and down a lot.  On her feet a lot.          Objective:   Physical Exam Filed Vitals:   02/13/14 1145  BP: 130/70  Pulse: 82  Temp: 97.8 F (36.6 C)     69  year old female in no acute distress.   HEENT:  Nares- clear.  Oropharynx - without lesions. NECK:  Supple.  Nontender.  No audible bruit.  HEART:  Appears to be regular. LUNGS:  No crackles or wheezing audible.  Respirations even and unlabored.  RADIAL PULSE:  Equal bilaterally. ABDOMEN:  Soft, nontender.  Bowel sounds present and normal.  No audible abdominal bruit.    EXTREMITIES:  No increased edema present.  Stable.  DP pulses palpable and equal bilaterally.  No increased erythema or warmth.          Assessment & Plan:  1. Pain of lower extremity, unspecified laterality She has back issues.  Being followed at pain clinic.  This is a different pain.  Described as leg aching and throbbing.  Discussed venous insufficiency.  Compression hose.   Discussed further w/up including vascular surgery evaluation and NCS.  She wants to try compression hose first and see how she responds.  Follow.  Further w/up pending results.

## 2014-02-26 ENCOUNTER — Telehealth: Payer: Self-pay | Admitting: Internal Medicine

## 2014-02-26 ENCOUNTER — Other Ambulatory Visit (INDEPENDENT_AMBULATORY_CARE_PROVIDER_SITE_OTHER): Payer: Medicare Other

## 2014-02-26 DIAGNOSIS — Z7901 Long term (current) use of anticoagulants: Secondary | ICD-10-CM

## 2014-02-26 LAB — PROTIME-INR
INR: 2.2 ratio — ABNORMAL HIGH (ref 0.8–1.0)
Prothrombin Time: 23.7 s — ABNORMAL HIGH (ref 9.6–13.1)

## 2014-02-26 NOTE — Telephone Encounter (Signed)
Pt has stopped coumadin today for procedure at the pain clinic on 2/1 at 2:30.msn

## 2014-02-26 NOTE — Telephone Encounter (Signed)
FYI

## 2014-02-26 NOTE — Telephone Encounter (Signed)
Noted  

## 2014-03-02 ENCOUNTER — Ambulatory Visit: Payer: Self-pay | Admitting: Anesthesiology

## 2014-03-02 DIAGNOSIS — M5416 Radiculopathy, lumbar region: Secondary | ICD-10-CM | POA: Diagnosis not present

## 2014-03-02 DIAGNOSIS — Z86718 Personal history of other venous thrombosis and embolism: Secondary | ICD-10-CM | POA: Diagnosis not present

## 2014-03-02 DIAGNOSIS — M545 Low back pain: Secondary | ICD-10-CM | POA: Diagnosis not present

## 2014-03-02 DIAGNOSIS — M79674 Pain in right toe(s): Secondary | ICD-10-CM | POA: Diagnosis not present

## 2014-03-02 DIAGNOSIS — M5136 Other intervertebral disc degeneration, lumbar region: Secondary | ICD-10-CM | POA: Diagnosis not present

## 2014-03-02 DIAGNOSIS — E780 Pure hypercholesterolemia: Secondary | ICD-10-CM | POA: Diagnosis not present

## 2014-03-02 DIAGNOSIS — Z7901 Long term (current) use of anticoagulants: Secondary | ICD-10-CM | POA: Diagnosis not present

## 2014-03-02 DIAGNOSIS — I10 Essential (primary) hypertension: Secondary | ICD-10-CM | POA: Diagnosis not present

## 2014-03-04 ENCOUNTER — Encounter: Payer: Self-pay | Admitting: Internal Medicine

## 2014-03-05 ENCOUNTER — Other Ambulatory Visit: Payer: Self-pay | Admitting: Internal Medicine

## 2014-03-13 IMAGING — US US EXTREM UP VENOUS*L*
1 series · 14 of 24 positions shown · non-contrast
Comparison: none

RESULT:      Technique: Gray scale, Duplex color  flow doppler, and spectral
waveform imaging was performed of the deep venous structures of the LEFT
upper extremity.

[Series 1: us extrem up venous*left* · 0.09mm/px · 14 of 34 slices shown]
[im 1/34]
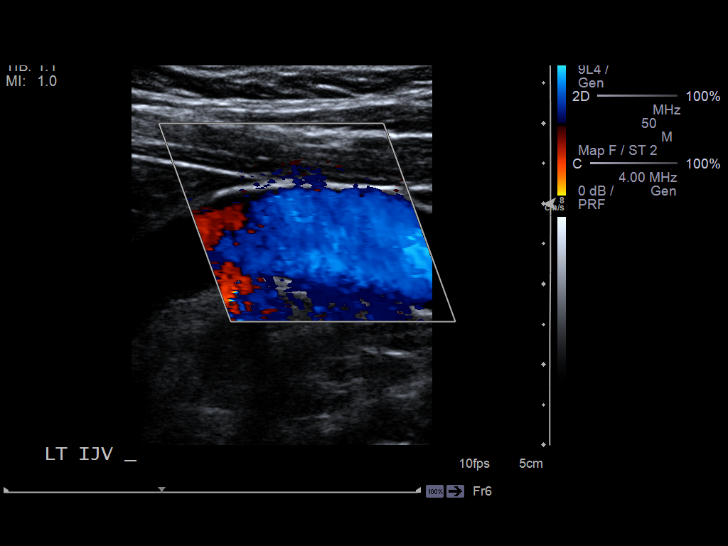
[im 3/34]
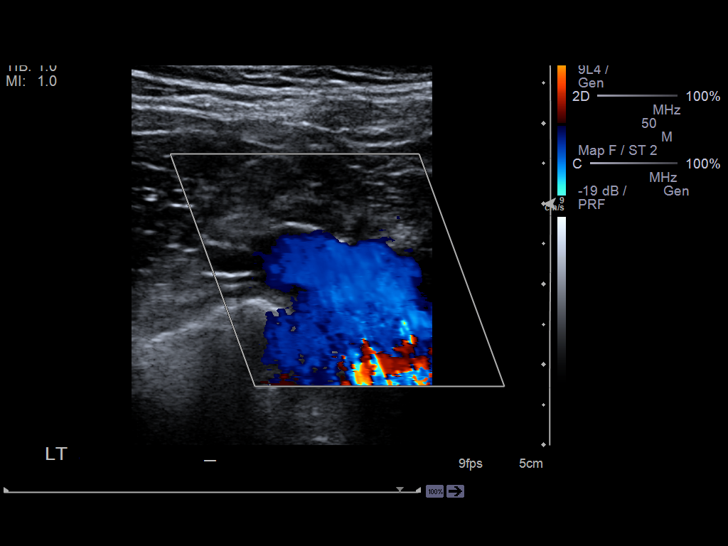
[im 6/34]
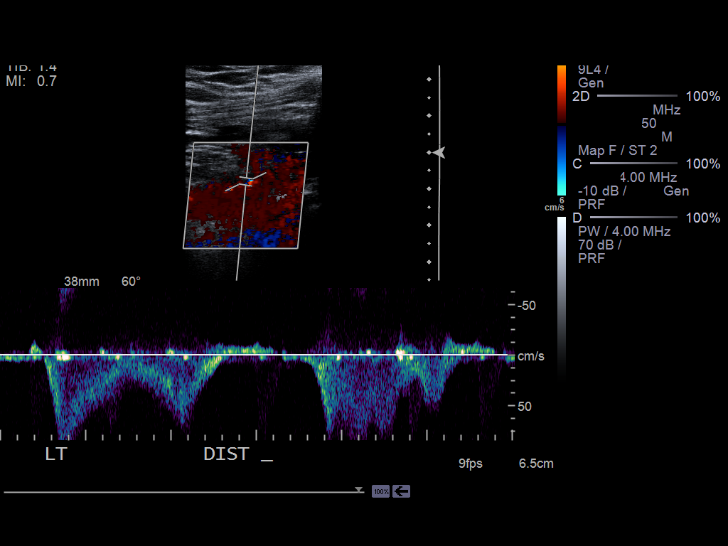
[im 9/34]
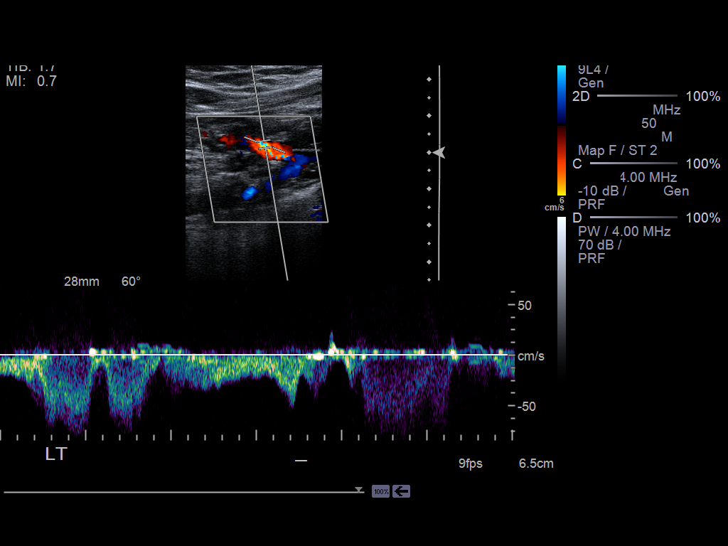
[im 11/34]
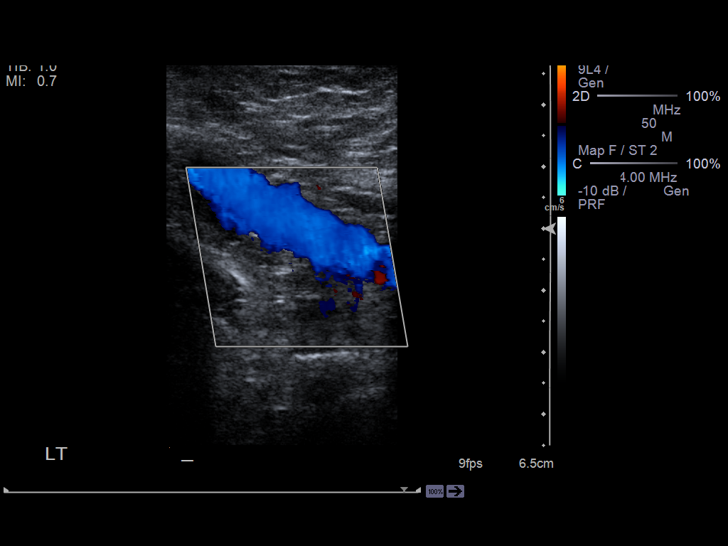
[im 13/34]
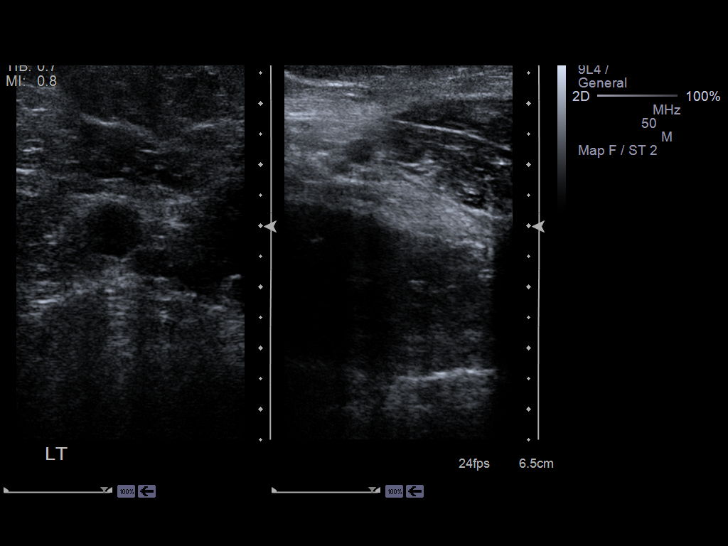
[im 16/34]
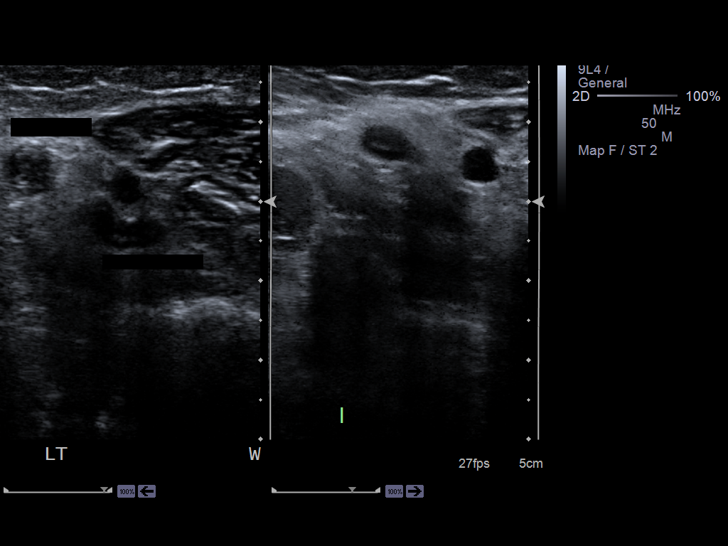
[im 18/34]
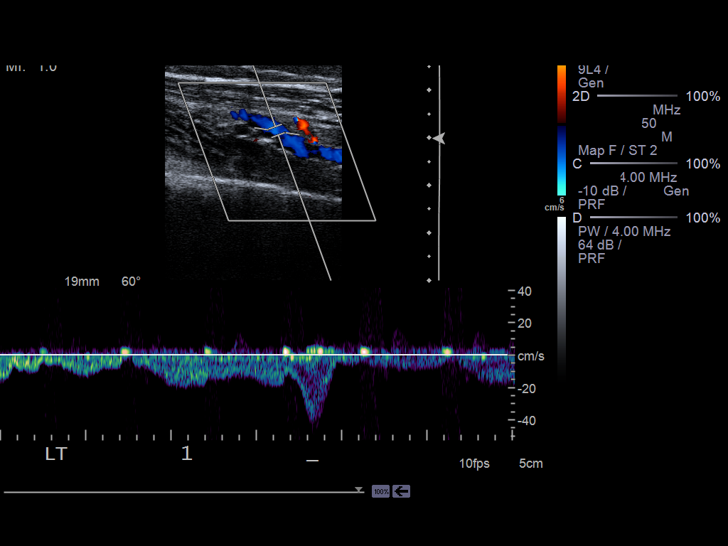
[im 21/34]
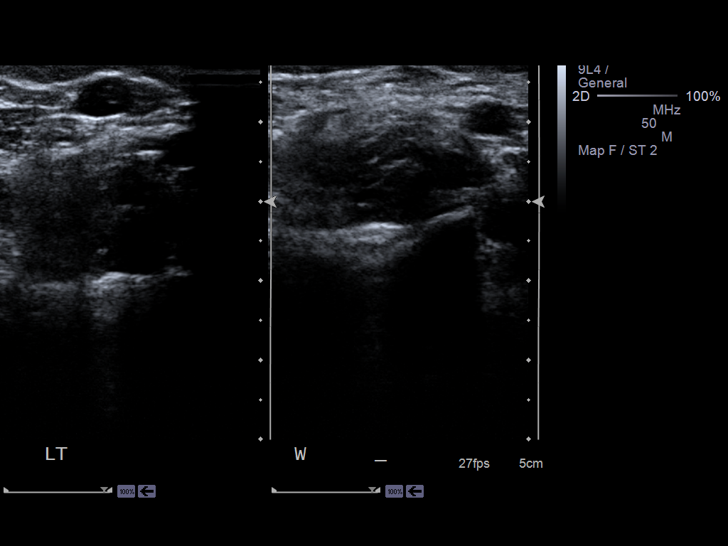
[im 23/34]
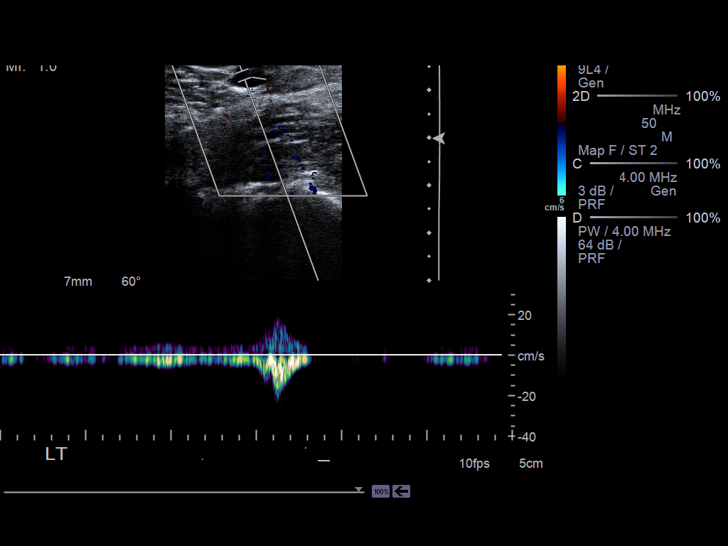
[im 26/34]
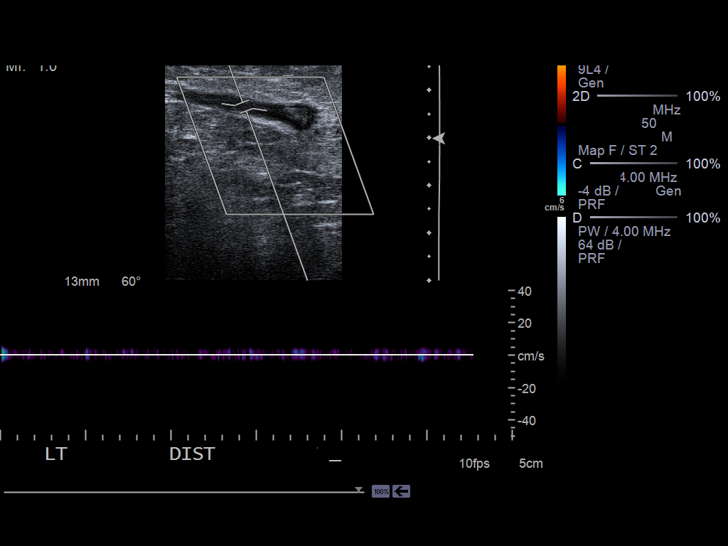
[im 28/34]
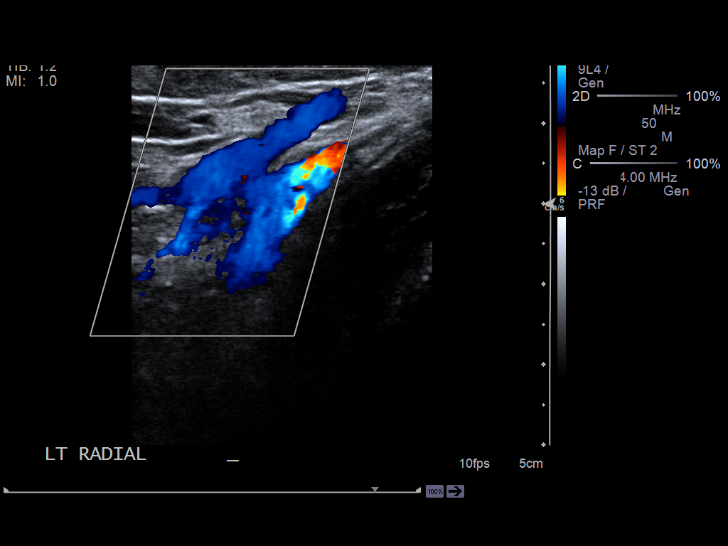
[im 31/34]
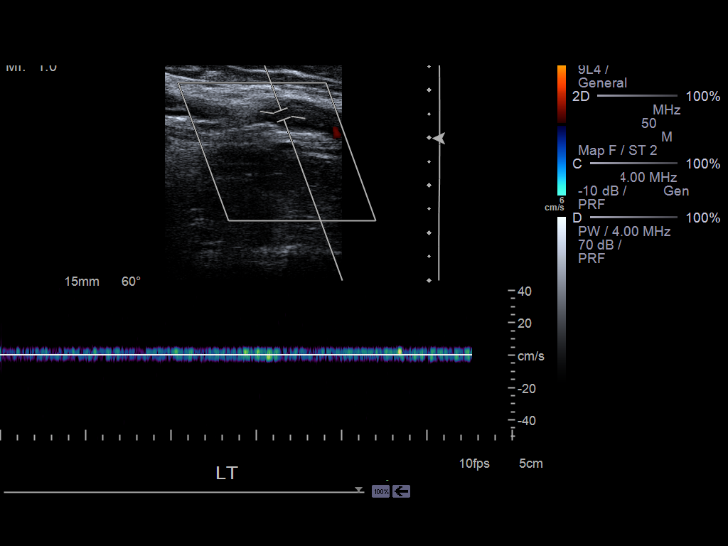
[im 34/34]
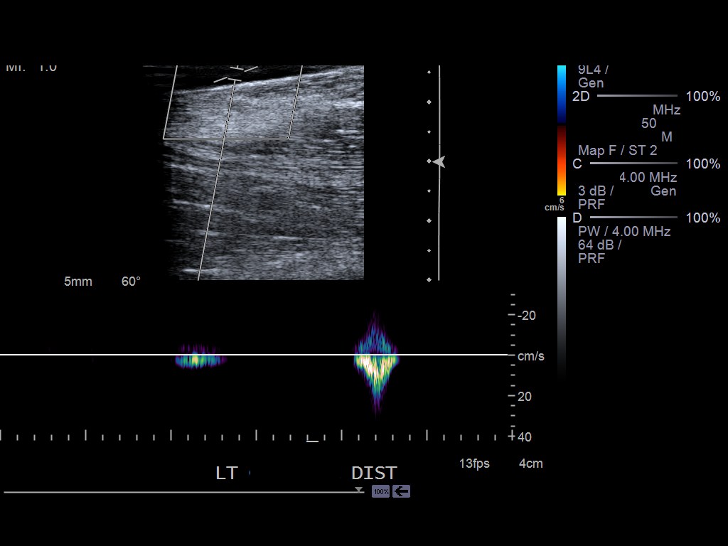

[14 of 24 positions shown; findings below may reference images not displayed]

FINDINGS: Evaluation of the basilic vein demonstrates increased
echogenicity, absent color filling, and abnormal spectral waveform imaging,
and no significant compressibility.

Interrogation of the remaining deep venous structures of the left upper
extremity is unremarkable. There is appropriate color filling of vessels,
compressibility, in appropriate response to augmentation.
IMPRESSION: The venous thrombus within the basilic vein described
above. These findings were relayed via telephone conversation to Abedrrahim Nio
the patient's FNP on 09/20/2012 at the time of the interpretation.(*)

## 2014-03-16 ENCOUNTER — Other Ambulatory Visit: Payer: Medicare Other

## 2014-03-31 ENCOUNTER — Encounter: Payer: Self-pay | Admitting: Nurse Practitioner

## 2014-03-31 ENCOUNTER — Ambulatory Visit (INDEPENDENT_AMBULATORY_CARE_PROVIDER_SITE_OTHER): Payer: Medicare Other | Admitting: Nurse Practitioner

## 2014-03-31 VITALS — BP 138/80 | HR 87 | Temp 98.0°F | Resp 16 | Ht 64.0 in | Wt 217.1 lb

## 2014-03-31 DIAGNOSIS — J069 Acute upper respiratory infection, unspecified: Secondary | ICD-10-CM | POA: Diagnosis not present

## 2014-03-31 DIAGNOSIS — B9789 Other viral agents as the cause of diseases classified elsewhere: Principal | ICD-10-CM

## 2014-03-31 DIAGNOSIS — Z7901 Long term (current) use of anticoagulants: Secondary | ICD-10-CM | POA: Diagnosis not present

## 2014-03-31 NOTE — Progress Notes (Signed)
Pre visit review using our clinic review tool, if applicable. No additional management support is needed unless otherwise documented below in the visit note. 

## 2014-03-31 NOTE — Patient Instructions (Signed)
Upper Respiratory Infection, Adult An upper respiratory infection (URI) is also sometimes known as the common cold. The upper respiratory tract includes the nose, sinuses, throat, trachea, and bronchi. Bronchi are the airways leading to the lungs. Most people improve within 1 week, but symptoms can last up to 2 weeks. A residual cough may last even longer.  CAUSES Many different viruses can infect the tissues lining the upper respiratory tract. The tissues become irritated and inflamed and often become very moist. Mucus production is also common. A cold is contagious. You can easily spread the virus to others by oral contact. This includes kissing, sharing a glass, coughing, or sneezing. Touching your mouth or nose and then touching a surface, which is then touched by another person, can also spread the virus. SYMPTOMS  Symptoms typically develop 1 to 3 days after you come in contact with a cold virus. Symptoms vary from person to person. They may include:  Runny nose.  Sneezing.  Nasal congestion.  Sinus irritation.  Sore throat.  Loss of voice (laryngitis).  Cough.  Fatigue.  Muscle aches.  Loss of appetite.  Headache.  Low-grade fever. DIAGNOSIS  You might diagnose your own cold based on familiar symptoms, since most people get a cold 2 to 3 times a year. Your caregiver can confirm this based on your exam. Most importantly, your caregiver can check that your symptoms are not due to another disease such as strep throat, sinusitis, pneumonia, asthma, or epiglottitis. Blood tests, throat tests, and X-rays are not necessary to diagnose a common cold, but they may sometimes be helpful in excluding other more serious diseases. Your caregiver will decide if any further tests are required. RISKS AND COMPLICATIONS  You may be at risk for a more severe case of the common cold if you smoke cigarettes, have chronic heart disease (such as heart failure) or lung disease (such as asthma), or if  you have a weakened immune system. The very young and very old are also at risk for more serious infections. Bacterial sinusitis, middle ear infections, and bacterial pneumonia can complicate the common cold. The common cold can worsen asthma and chronic obstructive pulmonary disease (COPD). Sometimes, these complications can require emergency medical care and may be life-threatening. PREVENTION  The best way to protect against getting a cold is to practice good hygiene. Avoid oral or hand contact with people with cold symptoms. Wash your hands often if contact occurs. There is no clear evidence that vitamin C, vitamin E, echinacea, or exercise reduces the chance of developing a cold. However, it is always recommended to get plenty of rest and practice good nutrition. TREATMENT  Treatment is directed at relieving symptoms. There is no cure. Antibiotics are not effective, because the infection is caused by a virus, not by bacteria. Treatment may include:  Increased fluid intake. Sports drinks offer valuable electrolytes, sugars, and fluids.  Breathing heated mist or steam (vaporizer or shower).  Eating chicken soup or other clear broths, and maintaining good nutrition.  Getting plenty of rest.  Using gargles or lozenges for comfort.  Controlling fevers with ibuprofen or acetaminophen as directed by your caregiver.  Increasing usage of your inhaler if you have asthma. Zinc gel and zinc lozenges, taken in the first 24 hours of the common cold, can shorten the duration and lessen the severity of symptoms. Pain medicines may help with fever, muscle aches, and throat pain. A variety of non-prescription medicines are available to treat congestion and runny nose. Your caregiver   can make recommendations and may suggest nasal or lung inhalers for other symptoms.  HOME CARE INSTRUCTIONS   Only take over-the-counter or prescription medicines for pain, discomfort, or fever as directed by your  caregiver.  Use a warm mist humidifier or inhale steam from a shower to increase air moisture. This may keep secretions moist and make it easier to breathe.  Drink enough water and fluids to keep your urine clear or pale yellow.  Rest as needed.  Return to work when your temperature has returned to normal or as your caregiver advises. You may need to stay home longer to avoid infecting others. You can also use a face mask and careful hand washing to prevent spread of the virus. SEEK MEDICAL CARE IF:   After the first few days, you feel you are getting worse rather than better.  You need your caregiver's advice about medicines to control symptoms.  You develop chills, worsening shortness of breath, or brown or red sputum. These may be signs of pneumonia.  You develop yellow or brown nasal discharge or pain in the face, especially when you bend forward. These may be signs of sinusitis.  You develop a fever, swollen neck glands, pain with swallowing, or white areas in the back of your throat. These may be signs of strep throat. SEEK IMMEDIATE MEDICAL CARE IF:   You have a fever.  You develop severe or persistent headache, ear pain, sinus pain, or chest pain.  You develop wheezing, a prolonged cough, cough up blood, or have a change in your usual mucus (if you have chronic lung disease).  You develop sore muscles or a stiff neck. Document Released: 07/12/2000 Document Revised: 04/10/2011 Document Reviewed: 04/23/2013 [employer] Patient Information 2015 [employer]. This information is not intended to replace advice given to you by your health care provider. Make sure you discuss any questions you have with your health care provider.  

## 2014-03-31 NOTE — Progress Notes (Signed)
   Subjective:    [name], female    DOB: October 14, 1945, 69 y.o.   MRN: [id number]  HPI  Ms. Hellums is a 69 yo female with a CC of sinusitis.   1) 3 days ago noticed drainage in back of throat and then woke up with sore throat. Ear ache - left 2-3 weeks. Not tried anything to date.    Review of Systems  Constitutional: Negative for fever, chills, diaphoresis and fatigue.  HENT: Positive for congestion, sinus pressure and sore throat. Negative for ear discharge, ear pain and sneezing.        Maxillary pressure  Eyes: Negative for visual disturbance.  Respiratory: Negative for cough, chest tightness, shortness of breath and wheezing.   Gastrointestinal: Negative for nausea, vomiting and diarrhea.  Skin: Negative for rash.  Neurological: Negative for dizziness, weakness and headaches.       Objective:   Physical Exam  Constitutional: She is oriented to person, place, and time. She appears well-developed and well-nourished. No distress.  BP 138/80 mmHg  Pulse 87  Temp(Src) 98 F (36.7 C) (Oral)  Resp 16  Ht 5\' 4"  (1.626 m)  Wt 217 lb 1.9 oz (98.485 kg)  BMI 37.25 kg/m2  SpO2 96%  LMP 04/29/1997   HENT:  Head: Normocephalic and atraumatic.  Right Ear: External ear normal.  Left Ear: External ear normal.  Mouth/Throat: No oropharyngeal exudate.  Eyes: EOM are normal. Pupils are equal, round, and reactive to light. Right eye exhibits no discharge. Left eye exhibits no discharge. No scleral icterus.  Neck: Normal range of motion. Neck supple. No thyromegaly present.  Cardiovascular: Normal rate, regular rhythm and normal heart sounds.  Exam reveals no gallop and no friction rub.   No murmur heard. Pulmonary/Chest: Effort normal and breath sounds normal. No respiratory distress. She has no wheezes. She has no rales. She exhibits no tenderness.  Lymphadenopathy:    She has no cervical adenopathy.  Neurological: She is alert and oriented to person, place, and time. No  cranial nerve deficit. She exhibits normal muscle tone. Coordination normal.  Skin: Skin is warm and dry. No rash noted. She is not diaphoretic.  Psychiatric: She has a normal mood and affect. Her behavior is normal. Judgment and thought content normal.          Assessment & Plan:  Viral URI w/ cough 1) Will try OTC management since likely viral 2) Cough syrup- Delsym or Robitussin OTC 3) Simply Saline to rinse nasal passages 4) RTC if worsens/fails to improve.  3) Sudafed PE for congestion

## 2014-04-01 ENCOUNTER — Encounter: Payer: Self-pay | Admitting: *Deleted

## 2014-04-01 LAB — PROTIME-INR
INR: 1.4 ratio — AB (ref 0.8–1.0)
PROTHROMBIN TIME: 15.5 s — AB (ref 9.6–13.1)

## 2014-04-02 ENCOUNTER — Encounter: Payer: Self-pay | Admitting: *Deleted

## 2014-04-04 DIAGNOSIS — Z7901 Long term (current) use of anticoagulants: Secondary | ICD-10-CM | POA: Insufficient documentation

## 2014-04-04 NOTE — Assessment & Plan Note (Signed)
Checking PT/INR today

## 2014-04-13 ENCOUNTER — Other Ambulatory Visit (INDEPENDENT_AMBULATORY_CARE_PROVIDER_SITE_OTHER): Payer: Medicare Other

## 2014-04-13 DIAGNOSIS — Z7901 Long term (current) use of anticoagulants: Secondary | ICD-10-CM | POA: Diagnosis not present

## 2014-04-13 LAB — PROTIME-INR
INR: 2.1 ratio — ABNORMAL HIGH (ref 0.8–1.0)
PROTHROMBIN TIME: 22.9 s — AB (ref 9.6–13.1)

## 2014-04-21 ENCOUNTER — Other Ambulatory Visit (INDEPENDENT_AMBULATORY_CARE_PROVIDER_SITE_OTHER): Payer: Medicare Other

## 2014-04-21 DIAGNOSIS — Z7901 Long term (current) use of anticoagulants: Secondary | ICD-10-CM | POA: Diagnosis not present

## 2014-04-21 LAB — PROTIME-INR
INR: 1.7 ratio — ABNORMAL HIGH (ref 0.8–1.0)
Prothrombin Time: 19 s — ABNORMAL HIGH (ref 9.6–13.1)

## 2014-04-29 ENCOUNTER — Other Ambulatory Visit (INDEPENDENT_AMBULATORY_CARE_PROVIDER_SITE_OTHER): Payer: Medicare Other

## 2014-04-29 DIAGNOSIS — Z7901 Long term (current) use of anticoagulants: Secondary | ICD-10-CM

## 2014-04-29 LAB — PROTIME-INR
INR: 2.3 ratio — ABNORMAL HIGH (ref 0.8–1.0)
Prothrombin Time: 24.7 s — ABNORMAL HIGH (ref 9.6–13.1)

## 2014-05-07 ENCOUNTER — Other Ambulatory Visit (INDEPENDENT_AMBULATORY_CARE_PROVIDER_SITE_OTHER): Payer: Medicare Other

## 2014-05-07 DIAGNOSIS — Z7901 Long term (current) use of anticoagulants: Secondary | ICD-10-CM | POA: Diagnosis not present

## 2014-05-07 LAB — PROTIME-INR
INR: 2 ratio — ABNORMAL HIGH (ref 0.8–1.0)
PROTHROMBIN TIME: 21.3 s — AB (ref 9.6–13.1)

## 2014-05-15 ENCOUNTER — Ambulatory Visit
Admit: 2014-05-15 | Disposition: A | Discharge status: Home / Self Care | Payer: Self-pay | Attending: Internal Medicine | Admitting: Internal Medicine

## 2014-05-15 DIAGNOSIS — Z1231 Encounter for screening mammogram for malignant neoplasm of breast: Secondary | ICD-10-CM | POA: Diagnosis not present

## 2014-05-15 LAB — HM MAMMOGRAPHY: HM MAMMO: NEGATIVE

## 2014-05-18 ENCOUNTER — Encounter: Payer: Self-pay | Admitting: *Deleted

## 2014-05-22 NOTE — H&P (Signed)
PATIENT NAME:  Tina Duarte, Tina Duarte MR#:  [id number] DATE OF BIRTH:  December 29, 1945  DATE OF ADMISSION:  11/19/2012  CHIEF COMPLAINT: Chronic low back pain and chronic rib pain.   PROCEDURE: None.   HISTORY OF PRESENT ILLNESS: Tina Duarte is a pleasant 69 year old white female seen for the first time here at the [facility]. She has been referred by Einar Pheasant, MD primarily for her low back pain. This is a pain that has been present for over 10 years and has gradually exacerbated to the point where maximum VAS score 7 and rarely a 0. She states that as of the past few weeks, the pain has actually gotten a little bit better, but is aggravated by activity such as standing and working and alleviated by medication management. She has no problems with bowel or bladder dysfunction. She has pain that wakes her up at night, but no associated numbness or tingling affecting the lower extremities or problems with weakness to the lower extremities. Otherwise, she is in a stable state. She has chronic urinary incontinence followed by Dr. Nicki Reaper. The pain is described as a throbbing feeling in her low back. No radiation is noted to the lower extremities at this point.   She also has some bilateral rib pain that has been extensively evaluated by Dr. Nicki Reaper with CT evaluation. This started after taking some cholesterol medication and has failed to resolve.   PAST MEDICAL HISTORY: Significant for previous utilization for blood thinners, a history of chronic urinary incontinence and a history of reflux. Also significant for previous deep vein thrombosis, hypercholesterolemia and hypertension.   SOCIAL HISTORY: She has two children and is a widow. Never smoked.   WORK HISTORY: She works at a [employer] in part-time capacity.  PRIMARY CARE PHYSICIAN: Dr. Einar Pheasant.   CURRENT MEDICATIONS: Include albuterol p.r.n., Celexa, Norco 325/5 mg 1 q.8 hours,  , Coumadin 5 mg orally every other day and 2.5 alternating  with the above and Flonase.   PAST SURGICAL HISTORY: Bilateral tubal ligation.   PHYSICAL EXAMINATION:  GENERAL: Reveals a pleasant white female in no acute distress. She is alert and oriented x 3, cooperative and compliant.  VITAL SIGNS: VAS is 3 over 10, temperature 97.4 with a blood pressure 161/83, pulse 71 with respirations 16, oxygen saturation is 97%.  HEENT: Pupils are equally round and reactive to light. Extraocular muscles are intact.  HEART: Regular rate and rhythm.  LUNGS: Clear to auscultation.  BACK: Inspection of low back reveals significant paraspinous muscle tenderness and significant pain on extension bilaterally. This does reproduce some pain that radiates from the central low back into the buttocks. She has limited range of motion and goes from seated to standing with some mild difficulty. With the patient in the supine position, her strength in the calves, anterior tibialis and knee flexors and extensors is intact. She has intact light touch sensation. She has a negative straight leg raise on the left and right side.   ASSESSMENT:  1.  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  2.  Degenerative disk disease.  3.  Myofascial low back pain.  4.  Chronic rib pain.  5.  Hypertension with a history of deep vein thrombosis, on Coumadin.   PLAN:  1.  I am going to request her x-ray evaluation from the lumbar region.  2.  I think she is a candidate for a diagnostic lumbar epidural steroid.  3.  Candidate for facet block  secondary to the facetogenic pain.  4.  I want her to continue with the hydrocodone for breakthrough pain as this is well tolerated and is helping her with her pain relief.  5.  Continue followup with Dr. Nicki Reaper regarding her rib pain.    ____________________________ Alvina Filbert. Andree Elk, MD jga:aw D: 11/20/2012 88:71:95 ET T: 11/20/2012 09:28:40 ET JOB#: [id number]  cc: Alvina Filbert. Andree Elk, MD, <Dictator> Einar Pheasant, MD Alvina Filbert ADAMS  MD ELECTRONICALLY SIGNED 11/20/2012 10:21

## 2014-05-23 NOTE — Discharge Summary (Signed)
PATIENT NAME:  Tina Duarte, Tina Duarte MR#:  [id number] DATE OF BIRTH:  1945-05-11  DATE OF ADMISSION:  02/23/2013 DATE OF DISCHARGE:  02/26/2013  PRINCIPLE DIAGNOSIS:  Rectus sheath hematoma.   OTHER DIAGNOSES: 1.  Factor V Leiden mutation.  2.  History of recurrent superficial venous thrombosis (June 2010 left greater saphenous vein up to but not including the common femoral vein; August 0962, left basilic vein).  3.  Hypertension.  4.  Low back pain secondary to L2 to L3 degenerative disk disease.  5.  Obesity.  6.  Depression.   HOSPITAL COURSE:  The patient was admitted to the hospital and resuscitated and did not require any blood products.  Her Coumadin was restarted and she had some diarrhea, but C. diff toxin was negative and therefore she was discharged home.    ____________________________ Consuela Mimes, MD wfm:ea D: 03/21/2013 00:14:47 ET T: 03/21/2013 03:06:46 ET JOB#: [id number]  cc: Consuela Mimes, MD, <Dictator> Consuela Mimes MD ELECTRONICALLY SIGNED 03/21/2013 7:27

## 2014-05-23 NOTE — H&P (Signed)
PATIENT NAME:  Tina Duarte, Tina Duarte MR#:  [id number] DATE OF BIRTH:  Jul 14, 1945  DATE OF ADMISSION:  02/23/2013  HISTORY OF PRESENT ILLNESS: Tina Duarte is a 69 year old white female who slipped on the ice 2 weeks ago and fell and injured her butt and her head. She did not have any loss of consciousness. She did fine for approximately 2 weeks until yesterday, when she experienced an acute onset of right-sided periumbilical abdominal pain. This worsened today and, therefore, she sought medical attention. She has no gastrointestinal symptoms and has had normal bowel movements and, in fact, is hungry. She also denies fever and chills.   PAST MEDICAL HISTORY:  1.  Factor V Leiden mutation causing recurrent superficial venous thrombosis (June 2010 of the left greater saphenous vein all the way up to but not including the common femoral vein; August 7096, left basilic vein).  2.  Hypertension.  3.  Low back pain secondary to L2-3 degenerative disk disease.  4.  Obesity.  5.  Depression.   MEDICATIONS: Losartan 50 mg daily, Coumadin 5 mg Monday, Tuesday, Thursday, Saturday p.m. and 2.5 mg Wednesday, Friday Sunday p.m. (total 27.5 mg per week).  Citalopram 20 mg daily, ProAir 90 mcg inhalation aerosol 2 puffs q.i.d. p.r.n. shortness of breath or wheezing, cefuroxime 250 mg b.i.d. for urinary tract infection, Protonix 40 mg daily.   ALLERGIES: None.   REVIEW OF SYSTEMS: Negative for 10 systems except as mentioned in the history of present illness above.   FAMILY HISTORY: Noncontributory.   SOCIAL HISTORY: The patient is widowed for the last 3 years, after being married for 47 years. Her daughter and 2 grandchildren and 1 great-grandchild lives with her in her house. She is retired from working in Pharmacist, hospital. She has never smoked cigarettes and never used alcoholic beverages.   PHYSICAL EXAMINATION: GENERAL: Reveals a healthy, elderly white female in no distress whatsoever. Height 5 feet 4  inches, weight 204 pounds, BMI 35.0.  VITAL SIGNS:  Temperature 97.8, pulse 96, respirations 20, blood pressure 135/65, oxygen saturation 93% on room air at rest.  HEENT: Pupils equally round and reactive to light. Extraocular movements intact. Sclerae anicteric. Oropharynx clear. Mucous membranes moist. Hearing intact to voice.  NECK: Supple with no tracheal deviation or jugular venous distention.  HEART: Regular rate and rhythm with no murmurs or rubs.  LUNGS: Clear to auscultation with normal respiratory effort bilaterally.  ABDOMEN: Soft, flat, nontender with the exception of the right periumbilical area and slightly below that with no distention, tympany or abnormal skin changes.  EXTREMITIES: No edema with normal capillary refill bilaterally.  NEUROLOGIC: Cranial nerves II through XII, motor and sensation grossly intact.  PSYCHIATRIC: Alert and oriented x 4, appropriate affect.   LABORATORY, DIAGNOSTIC AND RADIOLOGICAL DATA:  Other pertinent studies show a white blood cell count of 7.5, hemoglobin 13.6, hematocrit 40.8, platelet count 285,000. PT 24, INR 2.2. Urinalysis with 22 white blood cells per high-power field and 3+ leukocyte esterase with no bacteria seen. Gallbladder ultrasound. Which is completely normal. CT scan of the abdomen and pelvis which shows a right rectus sheath hematoma, likely acute, measuring 54 x 28 x 100 mm.   ASSESSMENT: Delayed right rectus sheath hematoma 2 weeks status post minor trauma in a patient taking chronic anticoagulation (in an appropriate range) for factor V Leiden deficiency and recurrent spontaneous superficial venous thromboses. These have been incorrectly labeled in her electronic health record as deep venous thromboses as both documented veins  are in the superficial venous system. They have been significant, however.   PLAN: Admit the patient to the hospital and hold her Coumadin tonight and check an INR and hematocrit in the morning and, if stable,  decrease her Coumadin to 3 mg q.p.m. (This would drop her weekly Coumadin dosage from 27.5 mg per week to 21 mg per week and simplify her dosing regimen). I would think that an INR somewhere in the 1.5 to 2 range might be therapeutic since her venous thromboses were not, in fact, in the deep venous system and she has never experienced a pulmonary embolism or other significant clotting complication and she is at some risk of trauma and has now had a minor bleeding complication.   ____________________________ Consuela Mimes, MD wfm:cs D: 02/23/2013 20:25:54 ET T: 02/23/2013 20:44:28 ET JOB#: [id number]  cc: Consuela Mimes, MD, <Dictator> Consuela Mimes MD ELECTRONICALLY SIGNED 03/03/2013 16:53

## 2014-05-25 ENCOUNTER — Other Ambulatory Visit: Payer: Medicare Other

## 2014-05-26 ENCOUNTER — Telehealth: Payer: Self-pay | Admitting: *Deleted

## 2014-05-26 ENCOUNTER — Other Ambulatory Visit (INDEPENDENT_AMBULATORY_CARE_PROVIDER_SITE_OTHER): Payer: Medicare Other

## 2014-05-26 DIAGNOSIS — Z7901 Long term (current) use of anticoagulants: Secondary | ICD-10-CM | POA: Diagnosis not present

## 2014-05-26 LAB — PROTIME-INR
INR: 1.6 ratio — ABNORMAL HIGH (ref 0.8–1.0)
PROTHROMBIN TIME: 17.1 s — AB (ref 9.6–13.1)

## 2014-05-26 NOTE — Telephone Encounter (Signed)
Noted  

## 2014-05-26 NOTE — Telephone Encounter (Signed)
Pt states she didn't take her coumadin yesterday

## 2014-05-27 ENCOUNTER — Encounter: Payer: Self-pay | Admitting: *Deleted

## 2014-05-28 ENCOUNTER — Ambulatory Visit (INDEPENDENT_AMBULATORY_CARE_PROVIDER_SITE_OTHER): Payer: Medicare Other

## 2014-05-28 ENCOUNTER — Ambulatory Visit (INDEPENDENT_AMBULATORY_CARE_PROVIDER_SITE_OTHER): Payer: Medicare Other | Admitting: Podiatry

## 2014-05-28 VITALS — BP 110/71 | HR 73 | Resp 16 | Ht 63.0 in | Wt 220.0 lb

## 2014-05-28 DIAGNOSIS — M722 Plantar fascial fibromatosis: Secondary | ICD-10-CM

## 2014-05-28 MED ORDER — TRIAMCINOLONE ACETONIDE 10 MG/ML IJ SUSP
10.0000 mg | Freq: Once | INTRAMUSCULAR | Status: AC
  Administered 2014-05-28: 10 mg

## 2014-05-28 NOTE — Patient Instructions (Signed)

## 2014-05-29 ENCOUNTER — Ambulatory Visit (INDEPENDENT_AMBULATORY_CARE_PROVIDER_SITE_OTHER): Payer: Medicare Other | Admitting: Internal Medicine

## 2014-05-29 VITALS — BP 120/80 | HR 72 | Temp 98.3°F | Ht 63.0 in | Wt 222.5 lb

## 2014-05-29 DIAGNOSIS — F439 Reaction to severe stress, unspecified: Secondary | ICD-10-CM

## 2014-05-29 DIAGNOSIS — M79673 Pain in unspecified foot: Secondary | ICD-10-CM | POA: Diagnosis not present

## 2014-05-29 DIAGNOSIS — Z658 Other specified problems related to psychosocial circumstances: Secondary | ICD-10-CM | POA: Diagnosis not present

## 2014-05-29 DIAGNOSIS — M79606 Pain in leg, unspecified: Secondary | ICD-10-CM | POA: Diagnosis not present

## 2014-05-29 NOTE — Progress Notes (Signed)
[name], female   DOB: 09/10/1945, 69 y.o.   MRN: [id number]   Subjective:    [name], female    DOB: [birth year], 69 y.o.   MRN: [id number]  HPI  Patient here as a work in with concerns regarding right leg pain.  She reports some increased pain - right lower lateral leg.  Just isolated to this area.  No pain in her back.  No pain radiating down her leg.  No numbness or tingling.  Pain to palpation.  Has increased spider veins, etc - over this area.  No redness or increased warmth.     Past Medical History  Diagnosis Date  . Asthma   . Allergy   . Hypercholesterolemia   . Hypertension   . Chicken pox   . History of blood clots     Outpatient Encounter Prescriptions as of 05/29/2014  . Order #: [id number] Class: Historical Med  . Order #: [id number] Class: Normal  . Order #: [id number] Class: Normal  . Order #: [id number] Class: Normal  . Order #: [id number] Class: Normal    Review of Systems  Constitutional: Negative for fever and appetite change.  HENT: Negative for congestion and sinus pressure.   Respiratory: Negative for cough, chest tightness and shortness of breath.   Cardiovascular: Negative for chest pain, palpitations and leg swelling.       Right leg larger than left - unchanged.    Gastrointestinal: Negative for nausea, vomiting and abdominal pain.  Musculoskeletal: Negative for back pain.       Pain localized to lower right lateral - leg.    Skin: Negative for color change and rash.       Objective:    Physical Exam  Constitutional: She appears well-developed and well-nourished. No distress.  Neck: Neck supple.  Cardiovascular: Normal rate and regular rhythm.   Pulmonary/Chest: Effort normal and breath sounds normal. No respiratory distress.  Abdominal: Soft. Bowel sounds are normal. There is no tenderness.  Musculoskeletal:  DP pulses palpable and equal bilaterally.  Increased spider veins - right lower lateral leg.  No increased  erythema or warmth.  No pain with flexion and extension of leg and foot.    Lymphadenopathy:    She has no cervical adenopathy.  Skin: No rash noted. No erythema.    BP 120/80 mmHg  Pulse 72  Temp(Src) 98.3 F (36.8 C) (Oral)  Ht 5\' 3"  (1.6 m)  Wt 222 lb 8 oz (100.925 kg)  BMI 39.42 kg/m2  SpO2 95%  LMP 04/29/1997 Wt Readings from Last 3 Encounters:  05/29/14 222 lb 8 oz (100.925 kg)  05/28/14 220 lb (99.791 kg)  03/31/14 217 lb 1.9 oz (98.485 kg)     Lab Results  Component Value Date   WBC 7.3 11/13/2013   HGB 13.4 11/13/2013   HCT 40.5 11/13/2013   PLT 282.0 11/13/2013   GLUCOSE 92 02/23/2013   CHOL 260* 04/29/2012   TRIG 212.0* 04/29/2012   HDL 47.00 04/29/2012   LDLDIRECT 197.8 04/29/2012   ALT 11 03/13/2013   AST 17 03/13/2013   NA 138 02/23/2013   K 3.9 02/23/2013   CL 106 02/23/2013   CREATININE 1.04 02/23/2013   BUN 10 02/23/2013   CO2 28 02/23/2013   TSH 2.78 08/13/2012   INR 1.6* 05/26/2014       Assessment & Plan:   Problem List Items Addressed This Visit    Heel pain    Saw  Dr Jacqualyn Posey.  S/p cortisone injection.  Brace.  Continues f/u with podiatry.        Leg pain - Primary    Right lower leg pain as outlined.  Feel more related to venous insufficiency.  Compression hose as directed.  Hold on further testing.  Follow.        Stress    Feels she is handling things relatively well.  Follow.            Einar Pheasant, MD

## 2014-05-29 NOTE — Progress Notes (Signed)
Pre visit review using our clinic review tool, if applicable. No additional management support is needed unless otherwise documented below in the visit note. 

## 2014-05-31 ENCOUNTER — Encounter: Payer: Self-pay | Admitting: Internal Medicine

## 2014-05-31 DIAGNOSIS — M79673 Pain in unspecified foot: Secondary | ICD-10-CM | POA: Insufficient documentation

## 2014-05-31 NOTE — Assessment & Plan Note (Signed)
Right lower leg pain as outlined.  Feel more related to venous insufficiency.  Compression hose as directed.  Hold on further testing.  Follow.

## 2014-05-31 NOTE — Assessment & Plan Note (Signed)
Feels she is handling things relatively well.  Follow.   

## 2014-05-31 NOTE — Assessment & Plan Note (Signed)
Saw Dr Jacqualyn Posey.  S/p cortisone injection.  Brace.  Continues f/u with podiatry.

## 2014-06-01 NOTE — Progress Notes (Signed)
Subjective:     [name], female   DOB: 1945-11-08, 69 y.o.   MRN: [id number]  HPI 69 year old female presents the office with complaints of left heel pain. She states that she presented surgery to her right heel several years ago. She plays that she has a heel spur on the left side. She states that she has painted monitor revealed his been ongoing for several months. She states that she has pain in the morning or after periods of rest which relieved by ambulation. She denies any numbness or tingling. The pain does not wake her up at night. Denies any swelling or redness. Denies any history of injury or trauma or any change or increase activity the time of onset of symptoms. No other complaints at this time.  Review of Systems  All other systems reviewed and are negative.      Objective:   Physical Exam AAO x3, NAD DP/PT pulses palpable bilaterally, CRT less than 3 seconds Protective sensation intact with Simms Weinstein monofilament, vibratory sensation intact, Achilles tendon reflex intact Tenderness to palpation overlying the plantar medial tubercle of the calcaneus to left heel at the insertion of the plantar fascia. There is no pain along the course of plantar fascial within the arch of the foot and the plantar fascia appears intact. There is no pain with lateral compression of the calcaneus or pain the vibratory sensation. No pain on the posterior aspect of the calcaneus or along the course/insertion of the Achilles tendon. There is no overlying edema, erythema, increase in warmth. No other areas of tenderness palpation or pain with vibratory sensation to the foot/ankle. MMT 5/5, ROM WNL No open lesions or pre-ulcerative lesions are identified. No pain with calf compression, swelling, warmth, erythema.     Assessment:     69 year old female with left heel pain, likely plantar fasciitis    Plan:     -X-rays were obtained and reviewed the patient -Treatment options were  discussed include alternatives, risks, complications. -Patient elects to proceed with steroid injection into the left heel. Under sterile skin preparation, a total of 2.5cc of kenalog 10, 0.5% Marcaine plain, and 2% lidocaine plain were infiltrated into the symptomatic area without complication. A band-aid was applied. Patient tolerated the injection well without complication. Post-injection care with discussed with the patient. Discussed with the patient to ice the area over the next couple of days to help prevent a steroid flare.  -Dispensed plantar fascial brace -Ice to the area -Discussed stretching exercises -Discussed shoe gear modification not to go barefoot while even in the house. Discussed possible orthotics. -Follow up in 3 weeks or sooner if any problems are to arise. In the meantime call the office with any questions, concerns, changes symptoms.

## 2014-06-03 ENCOUNTER — Encounter: Payer: Self-pay | Admitting: Internal Medicine

## 2014-06-03 ENCOUNTER — Other Ambulatory Visit (INDEPENDENT_AMBULATORY_CARE_PROVIDER_SITE_OTHER): Payer: Medicare Other

## 2014-06-03 DIAGNOSIS — Z7901 Long term (current) use of anticoagulants: Secondary | ICD-10-CM

## 2014-06-03 LAB — PROTIME-INR
INR: 1.5 ratio — ABNORMAL HIGH (ref 0.8–1.0)
Prothrombin Time: 16.7 s — ABNORMAL HIGH (ref 9.6–13.1)

## 2014-06-03 NOTE — Addendum Note (Signed)
Addended by: Karlene Einstein D on: 06/03/2014 10:02 AM   Modules accepted: Orders

## 2014-06-05 NOTE — Telephone Encounter (Signed)
Pt states that she takes 5mg  on Tues, Thurs, & Saturday and 2.5mg  all other days. And she states that she has not missed any doses.

## 2014-06-08 ENCOUNTER — Ambulatory Visit
Admission: RE | Admit: 2014-06-08 | Discharge: 2014-06-08 | Disposition: A | Discharge status: Home / Self Care | Payer: Medicare Other | Source: Ambulatory Visit | Attending: Internal Medicine | Admitting: Internal Medicine

## 2014-06-08 ENCOUNTER — Encounter: Payer: Self-pay | Admitting: Internal Medicine

## 2014-06-08 ENCOUNTER — Other Ambulatory Visit: Payer: Self-pay | Admitting: Internal Medicine

## 2014-06-08 ENCOUNTER — Ambulatory Visit (INDEPENDENT_AMBULATORY_CARE_PROVIDER_SITE_OTHER): Payer: Medicare Other | Admitting: Internal Medicine

## 2014-06-08 VITALS — BP 124/74 | HR 68 | Temp 97.8°F | Ht 63.0 in | Wt 212.2 lb

## 2014-06-08 DIAGNOSIS — R062 Wheezing: Secondary | ICD-10-CM

## 2014-06-08 DIAGNOSIS — R05 Cough: Secondary | ICD-10-CM | POA: Diagnosis not present

## 2014-06-08 DIAGNOSIS — R404 Transient alteration of awareness: Secondary | ICD-10-CM | POA: Diagnosis not present

## 2014-06-08 DIAGNOSIS — R079 Chest pain, unspecified: Secondary | ICD-10-CM | POA: Diagnosis not present

## 2014-06-08 DIAGNOSIS — J209 Acute bronchitis, unspecified: Secondary | ICD-10-CM

## 2014-06-08 DIAGNOSIS — R4020 Unspecified coma: Secondary | ICD-10-CM

## 2014-06-08 DIAGNOSIS — R059 Cough, unspecified: Secondary | ICD-10-CM

## 2014-06-08 MED ORDER — ALBUTEROL SULFATE HFA 108 (90 BASE) MCG/ACT IN AERS: 2.0000 | INHALATION_SPRAY | Freq: Four times a day (QID) | RESPIRATORY_TRACT | Status: DC | PRN

## 2014-06-08 MED ORDER — CEFDINIR 300 MG PO CAPS: 300.0000 mg | ORAL_CAPSULE | Freq: Two times a day (BID) | ORAL | Status: DC

## 2014-06-08 MED ORDER — ALBUTEROL SULFATE (2.5 MG/3ML) 0.083% IN NEBU
2.5000 mg | INHALATION_SOLUTION | Freq: Once | RESPIRATORY_TRACT | Status: AC
  Administered 2014-06-08: 2.5 mg via RESPIRATORY_TRACT

## 2014-06-08 MED ORDER — FLUTICASONE PROPIONATE HFA 110 MCG/ACT IN AERO: 2.0000 | INHALATION_SPRAY | Freq: Two times a day (BID) | RESPIRATORY_TRACT | Status: DC

## 2014-06-08 MED ORDER — PREDNISONE 10 MG PO TABS: ORAL_TABLET | ORAL | Status: DC

## 2014-06-08 NOTE — Patient Instructions (Signed)
Saline nasal spray - flush nose at least 2-3x/day  nasacort nasal spray - 2 sprays each nostril one time per day.  Do this in the evening.    Mucinex DM - one tablet in the am  Robitussin DM in the evening.

## 2014-06-08 NOTE — Progress Notes (Signed)
Pre visit review using our clinic review tool, if applicable. No additional management support is needed unless otherwise documented below in the visit note. 

## 2014-06-09 ENCOUNTER — Encounter: Payer: Self-pay | Admitting: Internal Medicine

## 2014-06-09 DIAGNOSIS — J209 Acute bronchitis, unspecified: Secondary | ICD-10-CM | POA: Insufficient documentation

## 2014-06-09 DIAGNOSIS — R4020 Unspecified coma: Secondary | ICD-10-CM | POA: Insufficient documentation

## 2014-06-09 NOTE — Assessment & Plan Note (Signed)
Symptoms and exam as outlined.  Was given albuterol neb here in the office.  Still with increased cough, congestion and wheezing, but increased air movement and improvement post neb.  Treat with omnicef as directed.  Saline nasal spray, nasacort as directed.  Prednisone taper as outlined.  Flovent inhaler and albuterol inhaler as directed.  Rest.  Fluids.  Check cxr.  Explained to her if symptoms changed or worsened, she would need reevaluation (to ER).  Will reassess in a few days.
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
## 2014-06-09 NOTE — Assessment & Plan Note (Signed)
Occurred with increased cough.  Discussed further w/up.  She declined EKG.  Declined further w/up.  Will have son drive her.  Treat infection and flare.  Follow.

## 2014-06-11 ENCOUNTER — Encounter: Payer: Self-pay | Admitting: Internal Medicine

## 2014-06-11 ENCOUNTER — Other Ambulatory Visit: Payer: Self-pay | Admitting: Internal Medicine

## 2014-06-11 ENCOUNTER — Ambulatory Visit (INDEPENDENT_AMBULATORY_CARE_PROVIDER_SITE_OTHER): Payer: Medicare Other | Admitting: Internal Medicine

## 2014-06-11 VITALS — BP 118/80 | HR 65 | Temp 98.1°F | Ht 63.0 in | Wt 212.2 lb

## 2014-06-11 DIAGNOSIS — Z86718 Personal history of other venous thrombosis and embolism: Secondary | ICD-10-CM

## 2014-06-11 DIAGNOSIS — J209 Acute bronchitis, unspecified: Secondary | ICD-10-CM | POA: Diagnosis not present

## 2014-06-11 DIAGNOSIS — I10 Essential (primary) hypertension: Secondary | ICD-10-CM

## 2014-06-11 NOTE — Progress Notes (Signed)
Pre visit review using our clinic review tool, if applicable. No additional management support is needed unless otherwise documented below in the visit note. 

## 2014-06-11 NOTE — Progress Notes (Signed)
[name], female   DOB: 1945-08-13, 69 y.o.   MRN: [id number]   Subjective:    [name], female    DOB: 08/22/1945, 69 y.o.   MRN: [id number]  HPI  Patient here for a scheduled follow up.  Was seen 06/08/14 for respiratory infection.  Being treated with abx and prednisone.  States feeling better.  Not as tight.  Sleeping better.  Still with some increased cough and congestion.  No fever. Eating and drinking well. No vomiting. Bowels stable.    Past Medical History  Diagnosis Date  . Asthma   . Allergy   . Hypercholesterolemia   . Hypertension   . Chicken pox   . History of blood clots     Current Outpatient Prescriptions on File Prior to Visit  Medication Sig Dispense Refill  . albuterol (PROVENTIL HFA) 108 (90 BASE) MCG/ACT inhaler Inhale 2 puffs into the lungs every 6 (six) hours as needed for wheezing. 1 Inhaler 1  . cefdinir (OMNICEF) 300 MG capsule Take 1 capsule (300 mg total) by mouth 2 (two) times daily. 20 capsule 0  . fluticasone (FLOVENT HFA) 110 MCG/ACT inhaler Inhale 2 puffs into the lungs 2 (two) times daily. 1 Inhaler 1  . losartan (COZAAR) 50 MG tablet TAKE ONE (1) TABLET BY MOUTH EVERY DAY 90 tablet 1  . pantoprazole (PROTONIX) 40 MG tablet TAKE ONE (1) TABLET EACH DAY 30 tablet 5  . predniSONE (DELTASONE) 10 MG tablet Take 6 tablets x 1 day and then decrease by 1/2 tablet per day until down to zero mg. 39 tablet 0  . warfarin (COUMADIN) 5 MG tablet TAKE ONE (1) TABLET EACH DAY (Patient taking differently: TAKE ONE (1) TABLET ON TUES, THURS,  SATURDAY. TAKE 1/2 TABLET ALL OTHER DAYS) 30 tablet 5   No current facility-administered medications on file prior to visit.    Review of Systems  Constitutional: Negative for appetite change and unexpected weight change.  HENT: Positive for postnasal drip. Negative for sinus pressure.   Respiratory: Positive for cough (is better.  ) and wheezing. Negative for chest tightness and shortness of breath.    Cardiovascular: Negative for chest pain and palpitations.  Gastrointestinal: Negative for nausea, vomiting and abdominal pain.  Neurological: Negative for dizziness and headaches.       Objective:    Physical Exam  Constitutional: No distress.  HENT:  Nose: Nose normal.  Mouth/Throat: Oropharynx is clear and moist.  Neck: Neck supple. No thyromegaly present.  Cardiovascular: Normal rate and regular rhythm.   Pulmonary/Chest: Breath sounds normal. No respiratory distress.  Still with some increased cough with forced expiration.  Increased air movement.    Abdominal: Soft. Bowel sounds are normal. There is no tenderness.  Musculoskeletal: She exhibits no edema or tenderness.  Lymphadenopathy:    She has no cervical adenopathy.  Psychiatric: She has a normal mood and affect. Her behavior is normal.    BP 118/80 mmHg  Pulse 65  Temp(Src) 98.1 F (36.7 C) (Oral)  Ht 5\' 3"  (1.6 m)  Wt 212 lb 4 oz (96.276 kg)  BMI 37.61 kg/m2  SpO2 94%  LMP 04/29/1997 Wt Readings from Last 3 Encounters:  06/11/14 212 lb 4 oz (96.276 kg)  06/08/14 212 lb 4 oz (96.276 kg)  05/29/14 222 lb 8 oz (100.925 kg)     Lab Results  Component Value Date   WBC 7.3 11/13/2013   HGB 13.4 11/13/2013   HCT 40.5 11/13/2013  PLT 282.0 11/13/2013   GLUCOSE 92 02/23/2013   CHOL 260* 04/29/2012   TRIG 212.0* 04/29/2012   HDL 47.00 04/29/2012   LDLDIRECT 197.8 04/29/2012   ALT 11 03/13/2013   AST 17 03/13/2013   NA 138 02/23/2013   K 3.9 02/23/2013   CL 106 02/23/2013   CREATININE 1.04 02/23/2013   BUN 10 02/23/2013   CO2 28 02/23/2013   TSH 2.78 08/13/2012   INR 1.5* 06/03/2014    Dg Chest 2 View  06/08/2014   CLINICAL DATA:  Chest pain, coughing, wheezing  EXAM: CHEST  2 VIEW  COMPARISON:  None.  FINDINGS: There is no focal parenchymal opacity, pleural effusion, or pneumothorax. The heart and mediastinal contours are unremarkable.  There is mild thoracic spine spondylosis.  IMPRESSION: No  active cardiopulmonary disease.   Electronically Signed   By: Kathreen Devoid   On: 06/08/2014 15:22       Assessment & Plan:   Problem List Items Addressed This Visit    Acute bronchitis    Better.  Complete the abx and the prednisone.  Follow closely.  Continue using inhalers.        Essential hypertension, benign - Primary    Blood pressure doing well.  Same medication regimen.  Follow pressures.       History of blood clots    Remains on coumadin.  Follow.            Einar Pheasant, MD

## 2014-06-12 NOTE — Telephone Encounter (Signed)
Refilled citalopram #30 with 2 refills.  

## 2014-06-12 NOTE — Telephone Encounter (Signed)
Please advise 

## 2014-06-14 ENCOUNTER — Encounter: Payer: Self-pay | Admitting: Internal Medicine

## 2014-06-14 NOTE — Assessment & Plan Note (Signed)
Remains on coumadin.  Follow.

## 2014-06-14 NOTE — Assessment & Plan Note (Signed)
Blood pressure doing well.  Same medication regimen.  Follow pressures.   

## 2014-06-14 NOTE — Assessment & Plan Note (Signed)
Better.  Complete the abx and the prednisone.  Follow closely.  Continue using inhalers.

## 2014-06-16 ENCOUNTER — Other Ambulatory Visit (INDEPENDENT_AMBULATORY_CARE_PROVIDER_SITE_OTHER): Payer: Medicare Other

## 2014-06-16 DIAGNOSIS — Z7901 Long term (current) use of anticoagulants: Secondary | ICD-10-CM | POA: Diagnosis not present

## 2014-06-16 LAB — PROTIME-INR
INR: 2.3 ratio — AB (ref 0.8–1.0)
PROTHROMBIN TIME: 24.6 s — AB (ref 9.6–13.1)

## 2014-06-18 ENCOUNTER — Encounter: Payer: Self-pay | Admitting: Podiatry

## 2014-06-18 ENCOUNTER — Ambulatory Visit (INDEPENDENT_AMBULATORY_CARE_PROVIDER_SITE_OTHER): Payer: Medicare Other | Admitting: Podiatry

## 2014-06-18 VITALS — Ht 63.0 in | Wt 212.0 lb

## 2014-06-18 DIAGNOSIS — M722 Plantar fascial fibromatosis: Secondary | ICD-10-CM | POA: Diagnosis not present

## 2014-06-18 NOTE — Progress Notes (Signed)
[name], female   DOB: December 17, 1945, 69 y.o.   MRN: [id number]  Subjective: 69 year old female presents the office they for follow up evaluation of left heel pain, plantar fasciitis. She states that his last appointment she has continue with the stretching icing activities and the plantar fascial brace. She is also been on prednisone for another issue which seems to help her foot quite a bit. She denies any pain to the heel at this time and she states that she is significantly improved. She denies any swelling or redness. No tenderness. No other complaints at this time.  Objective: AAO x3, NAD DP/PT pulses palpable bilaterally, CRT less than 3 seconds Protective sensation intact with Simms Weinstein monofilament, vibratory sensation intact, Achilles tendon reflex intact There is no tenderness to palpation overlying the plantar medial tubercle of the calcaneus to left heel at the insertion of the plantar fascia. There is no pain along the course of plantar fasci within the arch of the foot. There is no pain with lateral compression of the calcaneus or pain the vibratory sensation. No pain on the posterior aspect of the calcaneus or along the course/insertion of the Achilles tendon. There is no overlying edema, erythema, increase in warmth. No other areas of tenderness palpation or pain with vibratory sensation to the foot/ankle. MMT 5/5, ROM WNL No open lesions or pre-ulcerative lesions are identified. No pain with calf compression, swelling, warmth, erythema.  Assessment: 69 year old female with resolved left heel pain, resolved plantar fasciitis  Plan: Treatment options were discussed the patient including all alternatives, risks, complications. At this time patient symptoms appear to have completely resolved. I discussed with her I would continue the stretching, icing, plantar fascial brace over the next couple weeks until she is consistently not having any pain and that she can start  to slowly back off on the activity. Monitor for any reoccurrence of there is any change her increase in symptoms to call the office immediately. Continue his shoe gear modifications or discuss orthotics for long-term treatment. Follow-up as needed. Call the office with any questions, concerns, change in symptoms.

## 2014-06-19 ENCOUNTER — Other Ambulatory Visit (INDEPENDENT_AMBULATORY_CARE_PROVIDER_SITE_OTHER): Payer: Medicare Other

## 2014-06-19 DIAGNOSIS — Z7901 Long term (current) use of anticoagulants: Secondary | ICD-10-CM | POA: Diagnosis not present

## 2014-06-19 LAB — PROTIME-INR
INR: 2 ratio — ABNORMAL HIGH (ref 0.8–1.0)
Prothrombin Time: 22.3 s — ABNORMAL HIGH (ref 9.6–13.1)

## 2014-06-30 ENCOUNTER — Encounter: Payer: Self-pay | Admitting: *Deleted

## 2014-06-30 ENCOUNTER — Other Ambulatory Visit (INDEPENDENT_AMBULATORY_CARE_PROVIDER_SITE_OTHER): Payer: Medicare Other

## 2014-06-30 DIAGNOSIS — Z7901 Long term (current) use of anticoagulants: Secondary | ICD-10-CM | POA: Diagnosis not present

## 2014-06-30 LAB — PROTIME-INR
INR: 3.1 ratio — ABNORMAL HIGH (ref 0.8–1.0)
PROTHROMBIN TIME: 33.1 s — AB (ref 9.6–13.1)

## 2014-07-01 NOTE — Telephone Encounter (Signed)
Spoke with patient over the phone & pt aware of lab appt also

## 2014-07-02 ENCOUNTER — Other Ambulatory Visit (INDEPENDENT_AMBULATORY_CARE_PROVIDER_SITE_OTHER): Payer: Medicare Other

## 2014-07-02 DIAGNOSIS — Z7901 Long term (current) use of anticoagulants: Secondary | ICD-10-CM

## 2014-07-02 LAB — PROTIME-INR
INR: 2.7 ratio — ABNORMAL HIGH (ref 0.8–1.0)
PROTHROMBIN TIME: 29.5 s — AB (ref 9.6–13.1)

## 2014-07-03 ENCOUNTER — Encounter: Payer: Self-pay | Admitting: *Deleted

## 2014-07-15 ENCOUNTER — Other Ambulatory Visit (INDEPENDENT_AMBULATORY_CARE_PROVIDER_SITE_OTHER): Payer: Medicare Other

## 2014-07-15 ENCOUNTER — Other Ambulatory Visit: Payer: Self-pay | Admitting: Internal Medicine

## 2014-07-15 DIAGNOSIS — Z7901 Long term (current) use of anticoagulants: Secondary | ICD-10-CM

## 2014-07-15 LAB — PROTIME-INR
INR: 2.5 ratio — ABNORMAL HIGH (ref 0.8–1.0)
Prothrombin Time: 27.1 s — ABNORMAL HIGH (ref 9.6–13.1)

## 2014-08-06 ENCOUNTER — Other Ambulatory Visit (INDEPENDENT_AMBULATORY_CARE_PROVIDER_SITE_OTHER): Payer: Medicare Other

## 2014-08-06 ENCOUNTER — Ambulatory Visit (INDEPENDENT_AMBULATORY_CARE_PROVIDER_SITE_OTHER): Payer: Medicare Other | Admitting: Nurse Practitioner

## 2014-08-06 ENCOUNTER — Other Ambulatory Visit: Payer: Self-pay | Admitting: Internal Medicine

## 2014-08-06 VITALS — BP 118/68 | HR 64 | Temp 98.1°F | Resp 14 | Ht 69.0 in | Wt 218.2 lb

## 2014-08-06 DIAGNOSIS — R300 Dysuria: Secondary | ICD-10-CM

## 2014-08-06 DIAGNOSIS — Z7901 Long term (current) use of anticoagulants: Secondary | ICD-10-CM

## 2014-08-06 LAB — POCT URINALYSIS DIPSTICK
BILIRUBIN UA: NEGATIVE
Glucose, UA: NEGATIVE
KETONES UA: NEGATIVE
Nitrite, UA: POSITIVE
Protein, UA: NEGATIVE
SPEC GRAV UA: 1.015
UROBILINOGEN UA: 1
pH, UA: 7

## 2014-08-06 LAB — PROTIME-INR
INR: 3.1 ratio — ABNORMAL HIGH (ref 0.8–1.0)
Prothrombin Time: 33.1 s — ABNORMAL HIGH (ref 9.6–13.1)

## 2014-08-06 MED ORDER — NITROFURANTOIN MONOHYD MACRO 100 MG PO CAPS: 100.0000 mg | ORAL_CAPSULE | Freq: Two times a day (BID) | ORAL | Status: DC

## 2014-08-06 NOTE — Progress Notes (Signed)
   Subjective:    [name], female    DOB: 1945-03-31, 69 y.o.   MRN: [id number]  HPI  Tina Duarte is a 69 yo female with a CC of urinary frequency, strong odor, itching, and 2-3 weeks.   1) Pt reports dysuria, frequency, strong odor, and this has been ongoing x 2-3 weeks. Pt reports she feels her bladder has fallen, 4 days of Cipro June 21st from her friend's prescription.   Review of Systems  Constitutional: Negative for fever, chills, diaphoresis and fatigue.  Genitourinary: Positive for dysuria, urgency and frequency. Negative for hematuria, flank pain, decreased urine volume, difficulty urinating and pelvic pain.  Skin: Negative for rash.       Objective:   Physical Exam  Constitutional: She is oriented to person, place, and time. She appears well-developed and well-nourished. No distress.  BP 118/68 mmHg  Pulse 64  Temp(Src) 98.1 F (36.7 C)  Resp 14  Ht 5\' 9"  (1.753 m)  Wt 218 lb 3.2 oz (98.975 kg)  BMI 32.21 kg/m2  SpO2 96%  LMP 04/29/1997   HENT:  Head: Normocephalic and atraumatic.  Right Ear: External ear normal.  Left Ear: External ear normal.  Abdominal: There is no CVA tenderness.  Neurological: She is alert and oriented to person, place, and time. No cranial nerve deficit. She exhibits normal muscle tone. Coordination normal.  Skin: Skin is warm and dry. No rash noted. She is not diaphoretic.  Psychiatric: She has a normal mood and affect. Her behavior is normal. Judgment and thought content normal.       Assessment & Plan:  Dysuria-   1) POCT urine suspicious for UTI  2) Treat with Macrobid 3) Continue to check PT/INR  4) Encouraged probiotics 5) Obtain culture 6) FU prn worsening/failure to improve.

## 2014-08-06 NOTE — Patient Instructions (Signed)
Take the antibiotic as prescribed.  Please take with probiotics during the antibiotic use and 2 weeks afterwards to prevent diarrhea and yeast infections.

## 2014-08-06 NOTE — Progress Notes (Signed)
Pre visit review using our clinic review tool, if applicable. No additional management support is needed unless otherwise documented below in the visit note. 

## 2014-08-09 LAB — URINE CULTURE: Colony Count: >=100000

## 2014-08-10 ENCOUNTER — Other Ambulatory Visit (INDEPENDENT_AMBULATORY_CARE_PROVIDER_SITE_OTHER): Payer: Medicare Other

## 2014-08-10 DIAGNOSIS — Z7901 Long term (current) use of anticoagulants: Secondary | ICD-10-CM | POA: Diagnosis not present

## 2014-08-10 DIAGNOSIS — R399 Unspecified symptoms and signs involving the genitourinary system: Secondary | ICD-10-CM

## 2014-08-10 LAB — POCT URINALYSIS DIPSTICK
Bilirubin, UA: NEGATIVE
Blood, UA: NEGATIVE
Glucose, UA: NEGATIVE
Ketones, UA: NEGATIVE
Nitrite, UA: NEGATIVE
Protein, UA: NEGATIVE
Spec Grav, UA: 1.015
Urobilinogen, UA: 0.2
pH, UA: 6.5

## 2014-08-10 LAB — PROTIME-INR
INR: 2.7 ratio — ABNORMAL HIGH (ref 0.8–1.0)
Prothrombin Time: 29.2 s — ABNORMAL HIGH (ref 9.6–13.1)

## 2014-08-10 NOTE — Addendum Note (Signed)
Addended by: Karlene Einstein D on: 08/10/2014 11:52 AM   Modules accepted: Orders

## 2014-08-10 NOTE — Addendum Note (Signed)
Addended by: Karlene Einstein D on: 08/10/2014 10:56 AM   Modules accepted: Orders

## 2014-08-11 ENCOUNTER — Encounter: Payer: Self-pay | Admitting: *Deleted

## 2014-08-12 ENCOUNTER — Encounter: Payer: Self-pay | Admitting: *Deleted

## 2014-08-12 LAB — URINE CULTURE: Colony Count: 70000

## 2014-08-16 IMAGING — CT CT ABD-PELV W/ CM
2 of 5 series · 16 of 46 positions shown, 18 images · IV contrast (isovue)
Comparison: US ABDOMEN LIMITED RUQ/ASCITES dated 02/23/2013; CT
ABD-PELV W/O CM dated 09/04/2012

CLINICAL DATA: Right-sided abdominal pain and palpable mass since
yesterday. Currently taking antibiotics for a urinary tract
infection.

EXAM:
CT ABDOMEN AND PELVIS WITH CONTRAST
TECHNIQUE: Multidetector CT imaging of the abdomen and pelvis was performed
using the standard protocol following bolus administration of
intravenous contrast.
CONTRAST:  100 cc Isovue 370 IV contrast

[Series 2: routine abd pel with · axial · 0.82mm/px · z∈[-945,-530]mm · 13 of 93 slices shown, 15 images]
[im 5/93  soft-tissue]
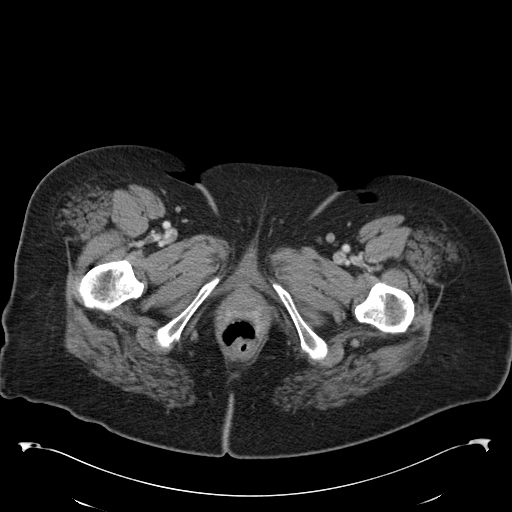
[im 5/93  bone]
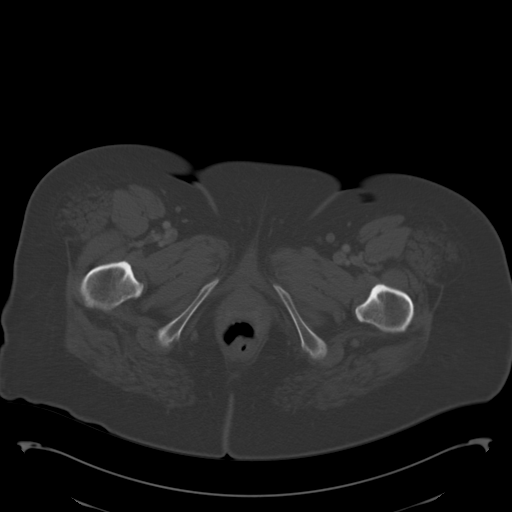
[im 14/93  soft-tissue]
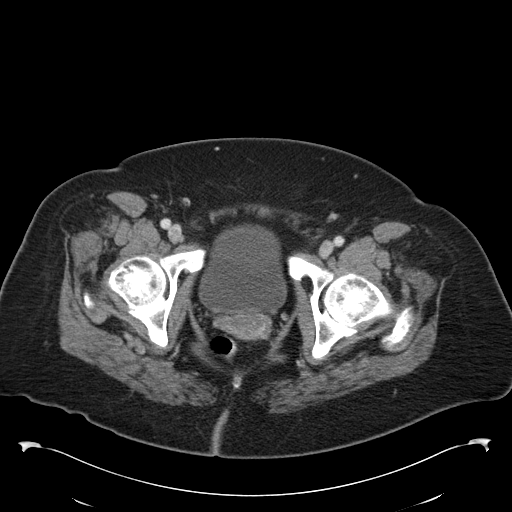
[im 19/93  soft-tissue]
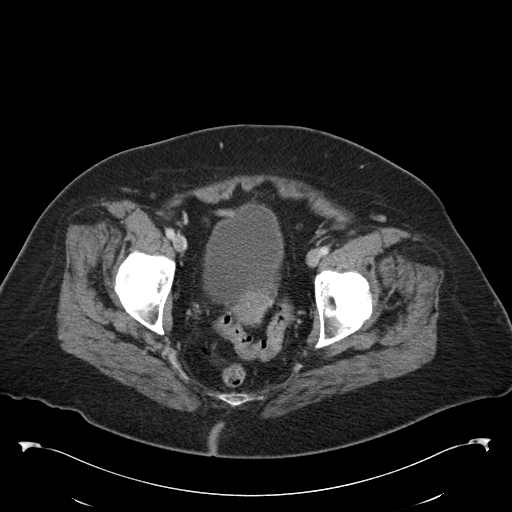
[im 28/93  soft-tissue]
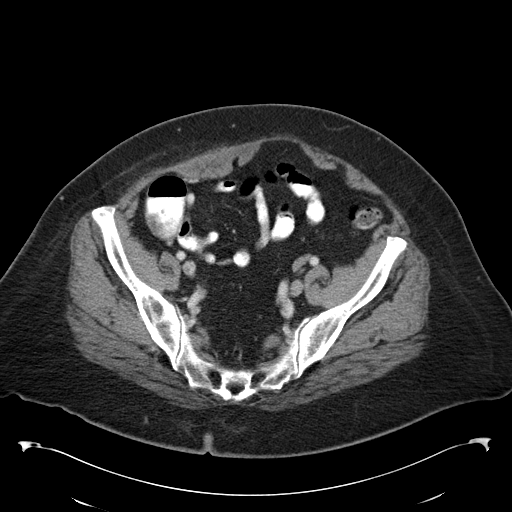
[im 33/93  soft-tissue]
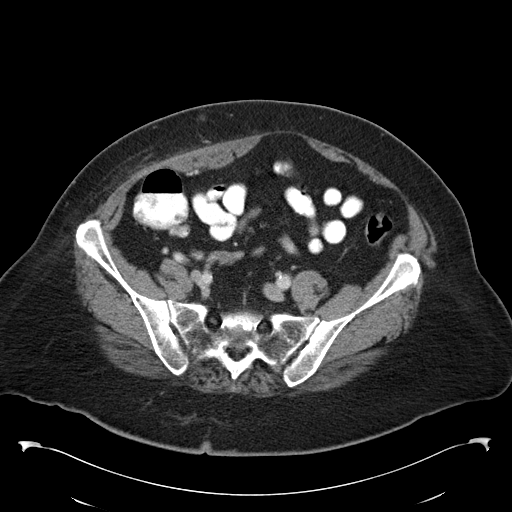
[im 42/93  soft-tissue]
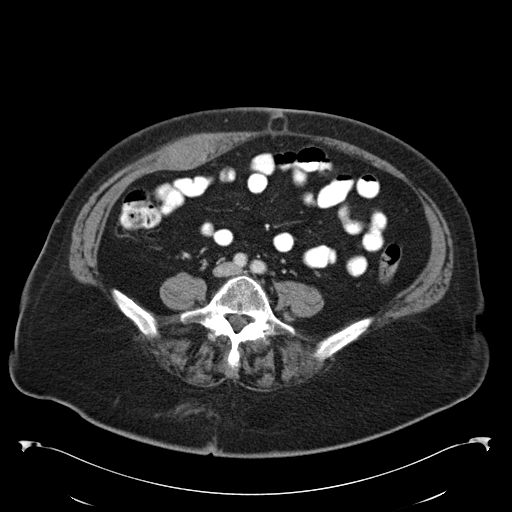
[im 47/93  soft-tissue]
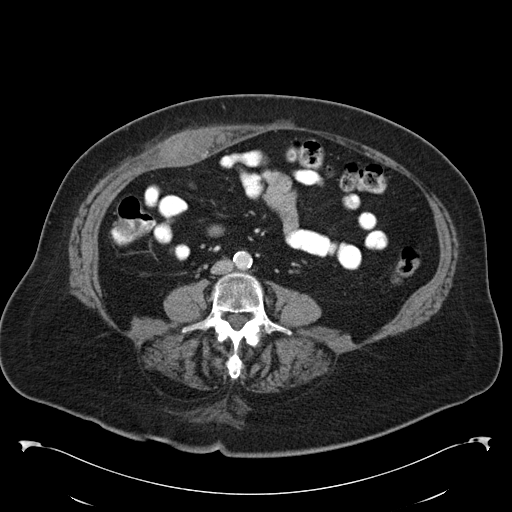
[im 51/93  soft-tissue]
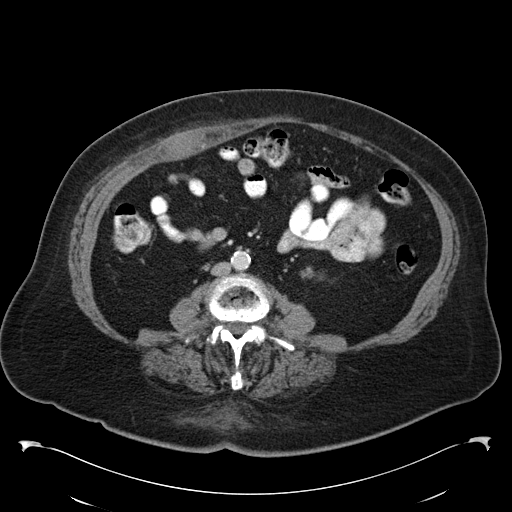
[im 60/93  soft-tissue]
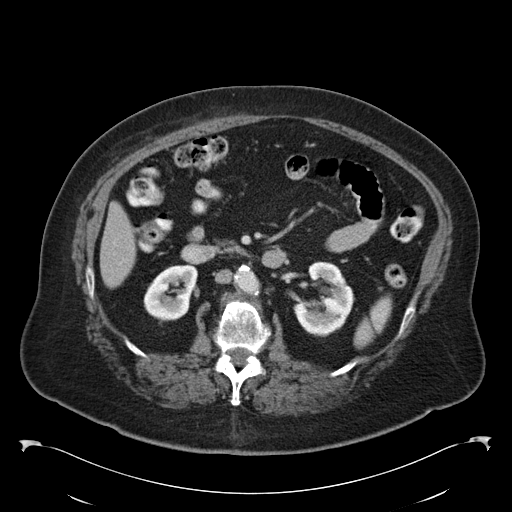
[im 60/93  bone]
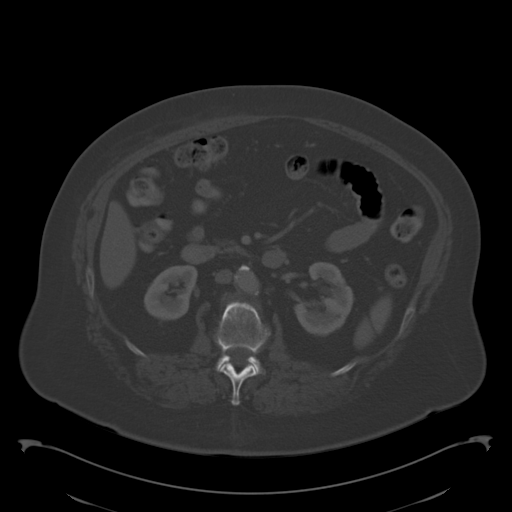
[im 65/93  soft-tissue]
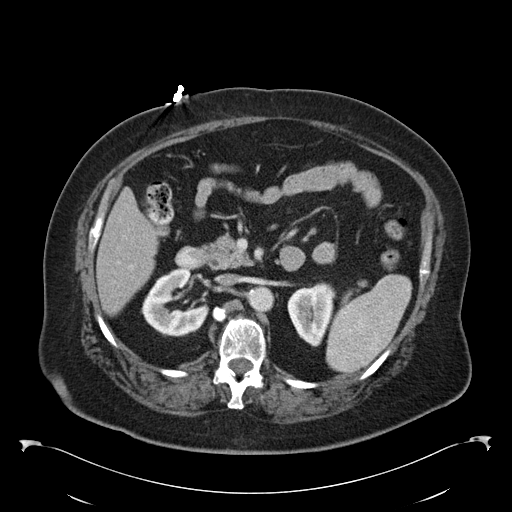
[im 74/93  soft-tissue]
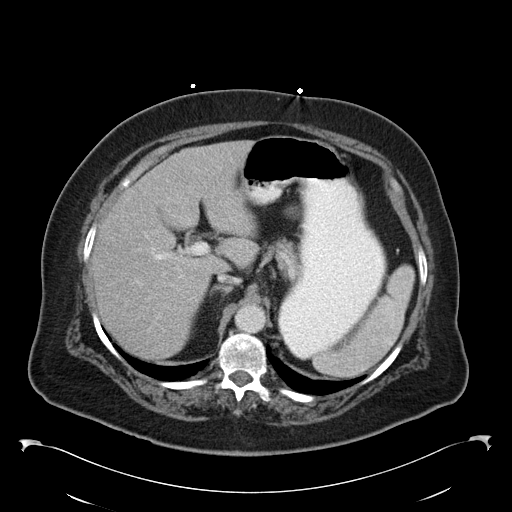
[im 79/93  soft-tissue]
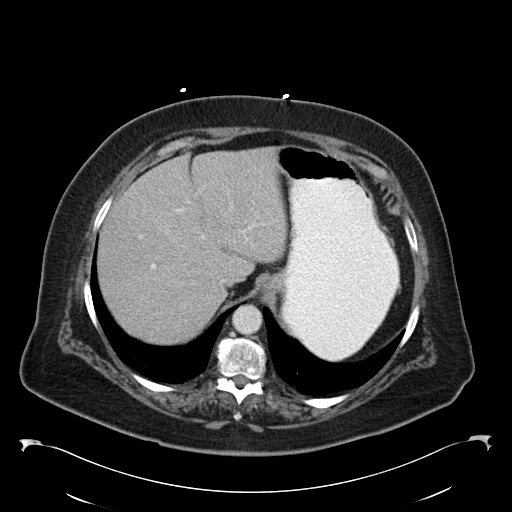
[im 88/93  soft-tissue]
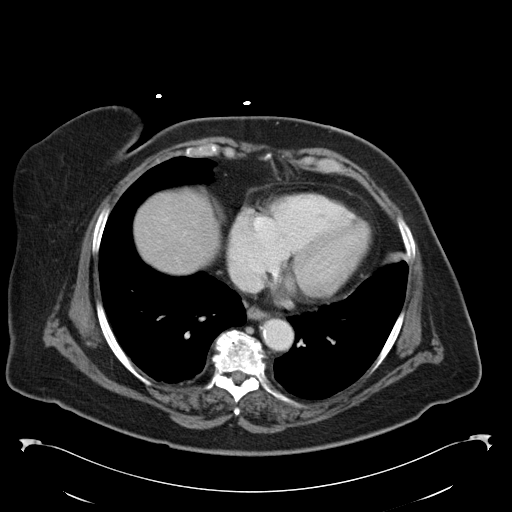

[Series 5: cor routine abd pel with · coronal · 0.73mm/px · 3 of 144 slices shown]
[im 48/144  soft-tissue]
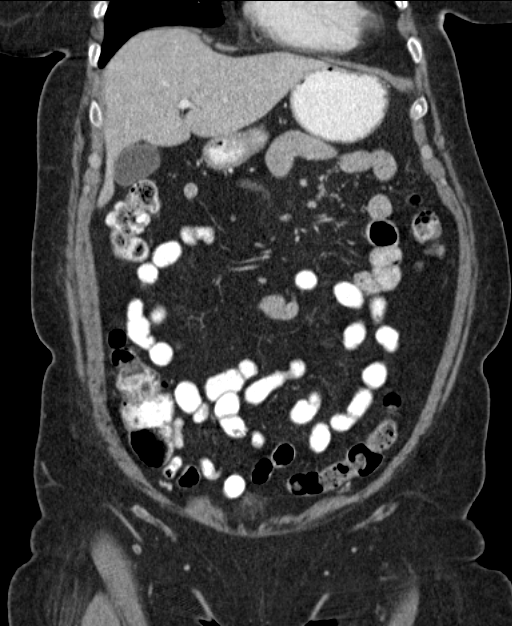
[im 64/144  soft-tissue]
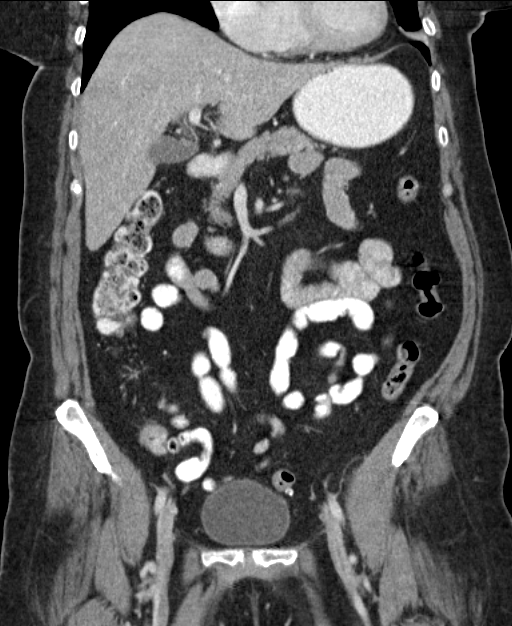
[im 80/144  soft-tissue]
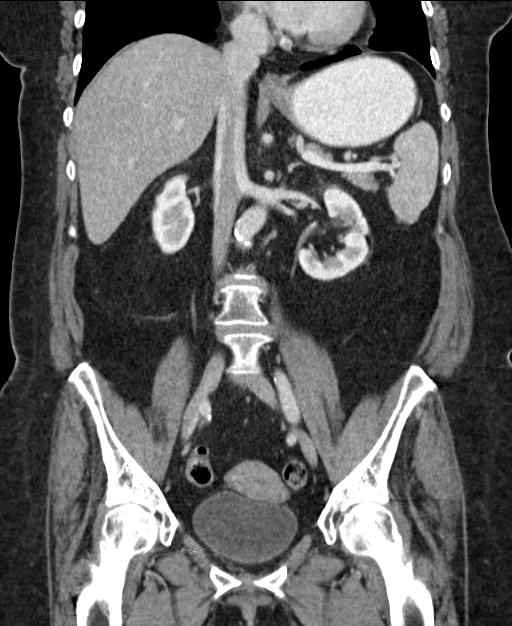

[16 of 46 positions shown; findings below may reference images not displayed]

FINDINGS: Minimal bibasilar dependent atelectasis is noted at the lung bases.
Liver, gallbladder, adrenal glands, spleen, and pancreas are normal.
Bilateral too small to characterize renal cortical hypodense lesions
are identified measuring 5 mm and smaller, statistically most likely
cysts, with other areas of renal cortical scarring. No
hydroureteronephrosis. No ascites, lymphadenopathy, or free fluid.
Small fat containing umbilical hernia.

Uterus and ovaries are normal. Bladder unremarkable. Appendix is
normal. No bowel wall thickening or focal segmental dilatation.

There is fusiform hyperdense enlargement of the right rectus muscle
measuring 5.4 x 2.8 cm in maximal transverse dimensions image 50 and
extending approximately 10 cm in craniocaudad dimension. No
intraperitoneal or retroperitoneal hemorrhage is identified.

Disc degenerative changes noted in the spine. No acute osseous
abnormality.
IMPRESSION: Hyperdense right rectus muscle enlargement compatible with acute
rectus hematoma given the provided clinical history. No evidence for
intra-abdominal or retroperitoneal hemorrhage or other acute
abnormality.

## 2014-08-17 ENCOUNTER — Other Ambulatory Visit: Payer: Medicare Other

## 2014-08-18 ENCOUNTER — Other Ambulatory Visit: Payer: Medicare Other

## 2014-08-19 ENCOUNTER — Encounter: Payer: Self-pay | Admitting: Nurse Practitioner

## 2014-08-19 DIAGNOSIS — Z79899 Other long term (current) drug therapy: Secondary | ICD-10-CM | POA: Insufficient documentation

## 2014-08-19 DIAGNOSIS — N390 Urinary tract infection, site not specified: Secondary | ICD-10-CM | POA: Insufficient documentation

## 2014-08-19 DIAGNOSIS — Z7901 Long term (current) use of anticoagulants: Secondary | ICD-10-CM | POA: Diagnosis not present

## 2014-08-19 DIAGNOSIS — I10 Essential (primary) hypertension: Secondary | ICD-10-CM | POA: Diagnosis not present

## 2014-08-19 DIAGNOSIS — R301 Vesical tenesmus: Secondary | ICD-10-CM | POA: Diagnosis not present

## 2014-08-19 DIAGNOSIS — N3289 Other specified disorders of bladder: Secondary | ICD-10-CM | POA: Diagnosis not present

## 2014-08-19 DIAGNOSIS — R300 Dysuria: Secondary | ICD-10-CM | POA: Diagnosis not present

## 2014-08-19 NOTE — ED Notes (Signed)
Pt to triage via w/c with no distress noted; pt c/o bladder pain since Sunday; st UTI 2wks ago and completed antibiotics with good relief of symptoms

## 2014-08-20 ENCOUNTER — Emergency Department
Admission: EM | Admit: 2014-08-20 | Discharge: 2014-08-20 | Disposition: A | Discharge status: Home / Self Care | Payer: Medicare Other | Attending: Emergency Medicine | Admitting: Emergency Medicine

## 2014-08-20 DIAGNOSIS — R300 Dysuria: Secondary | ICD-10-CM

## 2014-08-20 DIAGNOSIS — N390 Urinary tract infection, site not specified: Secondary | ICD-10-CM

## 2014-08-20 DIAGNOSIS — N3289 Other specified disorders of bladder: Secondary | ICD-10-CM

## 2014-08-20 LAB — URINALYSIS COMPLETE WITH MICROSCOPIC (ARMC ONLY)
Bilirubin Urine: NEGATIVE
Glucose, UA: NEGATIVE mg/dL
Hgb urine dipstick: NEGATIVE
KETONES UR: NEGATIVE mg/dL
LEUKOCYTES UA: NEGATIVE
NITRITE: POSITIVE — AB
PH: 8 (ref 5.0–8.0)
Protein, ur: NEGATIVE mg/dL
Specific Gravity, Urine: 1.008 (ref 1.005–1.030)

## 2014-08-20 MED ORDER — OXYBUTYNIN CHLORIDE 5 MG PO TABS: 5.0000 mg | ORAL_TABLET | Freq: Three times a day (TID) | ORAL | Status: DC | PRN

## 2014-08-20 MED ORDER — ONDANSETRON 4 MG PO TBDP
4.0000 mg | ORAL_TABLET | Freq: Once | ORAL | Status: AC
  Administered 2014-08-20: 4 mg via ORAL
  Filled 2014-08-20: qty 1

## 2014-08-20 MED ORDER — SULFAMETHOXAZOLE-TRIMETHOPRIM 800-160 MG PO TABS
1.0000 | ORAL_TABLET | Freq: Once | ORAL | Status: AC
  Administered 2014-08-20: 1 via ORAL
  Filled 2014-08-20: qty 1

## 2014-08-20 MED ORDER — NITROFURANTOIN MONOHYD MACRO 100 MG PO CAPS: 100.0000 mg | ORAL_CAPSULE | Freq: Two times a day (BID) | ORAL | Status: DC

## 2014-08-20 MED ORDER — OXYBUTYNIN CHLORIDE 5 MG PO TABS
5.0000 mg | ORAL_TABLET | Freq: Once | ORAL | Status: AC
  Administered 2014-08-20: 5 mg via ORAL
  Filled 2014-08-20: qty 1

## 2014-08-20 MED ORDER — HYDROCODONE-ACETAMINOPHEN 5-325 MG PO TABS
1.0000 | ORAL_TABLET | Freq: Once | ORAL | Status: AC
  Administered 2014-08-20: 1 via ORAL
  Filled 2014-08-20: qty 1

## 2014-08-20 NOTE — Discharge Instructions (Signed)
1. Urine cultures pending. You have been notified of any positive results. 2. Take antibiotic as prescribed (Macrobid 100 mg twice daily 5 days). 3. Take oxybutynin 5 mg every 8 hours as needed for bladder spasms. 4. Return to the ER for worsening symptoms, persistent vomiting, fever or other concerns.  Dysuria Dysuria is the medical term for pain with urination. There are many causes for dysuria, but urinary tract infection is the most common. If a urinalysis was performed it can show that there is a urinary tract infection. A urine culture confirms that you or your child is sick. You will need to follow up with a healthcare provider because:  If a urine culture was done you will need to know the culture results and treatment recommendations.  If the urine culture was positive, you or your child will need to be put on antibiotics or know if the antibiotics prescribed are the right antibiotics for your urinary tract infection.  If the urine culture is negative (no urinary tract infection), then other causes may need to be explored or antibiotics need to be stopped. Today laboratory work may have been done and there does not seem to be an infection. If cultures were done they will take at least 24 to 48 hours to be completed. Today x-rays may have been taken and they read as normal. No cause can be found for the problems. The x-rays may be re-read by a radiologist and you will be contacted if additional findings are made. You or your child may have been put on medications to help with this problem until you can see your primary caregiver. If the problems get better, see your primary caregiver if the problems return. If you were given antibiotics (medications which kill germs), take all of the mediations as directed for the full course of treatment.  If laboratory work was done, you need to find the results. Leave a telephone number where you can be reached. If this is not possible, make sure you find  out how you are to get test results. HOME CARE INSTRUCTIONS   Drink lots of fluids. For adults, drink eight, 8 ounce glasses of clear juice or water a day. For children, replace fluids as suggested by your caregiver.  Empty the bladder often. Avoid holding urine for long periods of time.  After a bowel movement, women should cleanse front to back, using each tissue only once.  Empty your bladder before and after sexual intercourse.  Take all the medicine given to you until it is gone. You may feel better in a few days, but TAKE ALL MEDICINE.  Avoid caffeine, tea, alcohol and carbonated beverages, because they tend to irritate the bladder.  In men, alcohol may irritate the prostate.  Only take over-the-counter or prescription medicines for pain, discomfort, or fever as directed by your caregiver.  If your caregiver has given you a follow-up appointment, it is very important to keep that appointment. Not keeping the appointment could result in a chronic or permanent injury, pain, and disability. If there is any problem keeping the appointment, you must call back to this facility for assistance. SEEK IMMEDIATE MEDICAL CARE IF:   Back pain develops.  A fever develops.  There is nausea (feeling sick to your stomach) or vomiting (throwing up).  Problems are no better with medications or are getting worse. MAKE SURE YOU:   Understand these instructions.  Will watch your condition.  Will get help right away if you are not doing  well or get worse. Document Released: 10/15/2003 Document Revised: 04/10/2011 Document Reviewed: 08/22/2007 [facility] Patient Information 2015 [place], [place]. This information is not intended to replace advice given to you by your health care provider. Make sure you discuss any questions you have with your health care provider.  Urinary Tract Infection A urinary tract infection (UTI) can occur any place along the urinary tract. The tract includes the kidneys,  ureters, bladder, and urethra. A type of germ called bacteria often causes a UTI. UTIs are often helped with antibiotic medicine.  HOME CARE   If given, take antibiotics as told by your doctor. Finish them even if you start to feel better.  Drink enough fluids to keep your pee (urine) clear or pale yellow.  Avoid tea, drinks with caffeine, and bubbly (carbonated) drinks.  Pee often. Avoid holding your pee in for a long time.  Pee before and after having sex (intercourse).  Wipe from front to back after you poop (bowel movement) if you are a woman. Use each tissue only once. GET HELP RIGHT AWAY IF:   You have back pain.  You have lower belly (abdominal) pain.  You have chills.  You feel sick to your stomach (nauseous).  You throw up (vomit).  Your burning or discomfort with peeing does not go away.  You have a fever.  Your symptoms are not better in 3 days. MAKE SURE YOU:   Understand these instructions.  Will watch your condition.  Will get help right away if you are not doing well or get worse. Document Released: 07/05/2007 Document Revised: 10/11/2011 Document Reviewed: 08/17/2011 [facility] Patient Information 2015 [place], [place]. This information is not intended to replace advice given to you by your health care provider. Make sure you discuss any questions you have with your health care provider.

## 2014-08-20 NOTE — Telephone Encounter (Signed)
I have scheduled her a lab appt to repeat urine tomorrow at 10:45am. Need order placed for urine.

## 2014-08-20 NOTE — ED Provider Notes (Signed)
[facility] Emergency Department Provider Note  ____________________________________________  Time seen: Approximately 2:04 AM  I have reviewed the triage vital signs and the nursing notes.   HISTORY  Chief Complaint Dysuria    HPI Clint Strupp Corredor is a 69 y.o. female who presents to the ED from home with complaints of bladder pain. Patient was treated for a UTI 2 weeks ago with Macrobid and completed antibiotics with good relief of symptoms. 4 days ago she began to have dysuria with bladder spasms similar to her symptoms of her prior UTI. Denies fever, chills, chest pain, shortness of breath, abdominal pain, vomiting, diarrhea, weakness. Does endorse occasional nausea. Also states "my bladder is falling out of my vagina".   Past Medical History  Diagnosis Date  . Asthma   . Allergy   . Hypercholesterolemia   . Hypertension   . Chicken pox   . History of blood clots     Patient Active Problem List   Diagnosis Date Noted  . Acute bronchitis 06/09/2014  . LOC (loss of consciousness) 06/09/2014  . Heel pain 05/31/2014  . Long term current use of anticoagulant therapy 04/04/2014  . Leg pain 02/16/2014  . Back pain 12/28/2013  . Bruise 11/22/2013  . Stress 09/07/2013  . Tongue pain 07/26/2013  . Cough 04/16/2013  . Abdominal wall hematoma 03/09/2013  . Left hip pain 12/05/2012  . 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 Rib pain 08/28/2012  . Essential hypertension, benign 05/22/2012  . Hypercholesterolemia 05/22/2012  . Environmental allergies 05/22/2012  . Asthma 05/22/2012  . History of blood clots 05/22/2012  . Diarrhea 05/22/2012    Past Surgical History  Procedure Laterality Date  . Blood clots  2008    Current Outpatient Rx  Name  Route  Sig  Dispense  Refill  . albuterol (PROVENTIL HFA) 108 (90 BASE) MCG/ACT inhaler   Inhalation   Inhale 2 puffs into the lungs every 6 (six) hours as needed for wheezing.   1 Inhaler   1   . citalopram  (CELEXA) 20 MG tablet      TAKE ONE (1) TABLET EACH DAY   30 tablet   2   . fluticasone (FLOVENT HFA) 110 MCG/ACT inhaler   Inhalation   Inhale 2 puffs into the lungs 2 (two) times daily.   1 Inhaler   1   . losartan (COZAAR) 50 MG tablet      TAKE ONE (1) TABLET EACH DAY   90 tablet   1   . nitrofurantoin, macrocrystal-monohydrate, (MACROBID) 100 MG capsule   Oral   Take 1 capsule (100 mg total) by mouth 2 (two) times daily.   10 capsule   0   . pantoprazole (PROTONIX) 40 MG tablet      TAKE ONE (1) TABLET EACH DAY   30 tablet   6   . predniSONE (DELTASONE) 10 MG tablet      Take 6 tablets x 1 day and then decrease by 1/2 tablet per day until down to zero mg.   39 tablet   0   . warfarin (COUMADIN) 5 MG tablet      TAKE ONE (1) TABLET EACH DAY Patient taking differently: TAKE ONE (1) TABLET ON TUES, THURS,  SATURDAY. TAKE 1/2 TABLET ALL OTHER DAYS   30 tablet   5     Allergies Review of patient's allergies indicates no known allergies.  Family History  Problem Relation Age of Onset  . Cancer Mother  Breast  . Heart disease Mother   . Stroke Mother   . Hypertension Mother   . Heart disease Father   . Stroke Father   . Hypertension Father   . Diabetes Father   . Colon cancer      paternal cousin    Social History History  Substance Use Topics  . Smoking status: Never Smoker   . Smokeless tobacco: Never Used  . Alcohol Use: No    Review of Systems Constitutional: No fever/chills Eyes: No visual changes. ENT: No sore throat. Cardiovascular: Denies chest pain. Respiratory: Denies shortness of breath. Gastrointestinal: Positive for abdominal pain.  Positive for nausea, no vomiting.  No diarrhea.  No constipation. Genitourinary: Positive for dysuria and bladder spasms. Musculoskeletal: Negative for back pain. Skin: Negative for rash. Neurological: Negative for headaches, focal weakness or numbness.  10-point ROS otherwise  negative.  ____________________________________________   PHYSICAL EXAM:  VITAL SIGNS: ED Triage Vitals  Enc Vitals Group     BP 08/19/14 2326 145/70 mmHg     Pulse Rate 08/19/14 2326 86     Resp 08/19/14 2326 18     Temp 08/19/14 2326 98.2 F (36.8 C)     Temp Source 08/19/14 2326 Oral     SpO2 08/19/14 2326 95 %     Weight 08/19/14 2326 215 lb (97.523 kg)     Height 08/19/14 2326 5\' 3"  (1.6 m)     Head Cir --      Peak Flow --      Pain Score 08/19/14 2335 10     Pain Loc --      Pain Edu? --      Excl. in [place]? --     Constitutional: Alert and oriented. Well appearing and in no acute distress. Eyes: Conjunctivae are normal. PERRL. EOMI. Head: Atraumatic. Nose: No congestion/rhinnorhea. Mouth/Throat: Mucous membranes are moist.  Oropharynx non-erythematous. Neck: No stridor.   Cardiovascular: Normal rate, regular rhythm. Grossly normal heart sounds.  Good peripheral circulation. Respiratory: Normal respiratory effort.  No retractions. Lungs CTAB. Gastrointestinal: Soft, mildly tender to palpation suprapubic area without rebound or guarding. No distention. No abdominal bruits. No CVA tenderness. Genitourinary: Exam and with patient laying flat: No bladder prolapse noted. Musculoskeletal: No lower extremity tenderness nor edema.  No joint effusions. Neurologic:  Normal speech and language. No gross focal neurologic deficits are appreciated. No gait instability. Skin:  Skin is warm, dry and intact. No rash noted. Psychiatric: Mood and affect are normal. Speech and behavior are normal.  ____________________________________________   LABS (all labs ordered are listed, but only abnormal results are displayed)  Labs Reviewed  URINALYSIS COMPLETEWITH MICROSCOPIC ([place] ONLY) - Abnormal; Notable for the following:    Color, Urine AMBER (*)    APPearance CLEAR (*)    Nitrite POSITIVE (*)    Bacteria, UA RARE (*)    Squamous Epithelial / LPF 0-5 (*)    All other components  within normal limits  URINE CULTURE   ____________________________________________  EKG  None ____________________________________________  RADIOLOGY  None ____________________________________________   PROCEDURES  Procedure(s) performed: None  Critical Care performed: No  ____________________________________________   INITIAL IMPRESSION / ASSESSMENT AND PLAN / ED COURSE  Pertinent labs & imaging results that were available during my care of the patient were reviewed by me and considered in my medical decision making (see chart for details).  68 year old female recently treated for UTI with return of symptoms of dysuria, bladder spasms and suprapubic pain. Urinalysis notable  for + nitrites. Will add urine culture. Will obtain a bladder scan to evaluate post void residual, start oxybutynin, antibiotics and analgesia. Will reassess.   ----------------------------------------- 3:09 AM on 08/20/2014 -----------------------------------------  Bladder scan reveals 143mL (not post-void). Discovered patient takes Coumadin for factor V Leiden deficiency. Will change antibiotic to nitrofurantoin instead of Septra. Discussed with patient and son. Strict return precautions given. Both verbalize understanding and agree with plan. ____________________________________________   FINAL CLINICAL IMPRESSION(S) / ED DIAGNOSES  Final diagnoses:  UTI (lower urinary tract infection)  Dysuria  Bladder spasm      Paulette Blanch, MD 08/20/14 [phone]

## 2014-08-20 NOTE — ED Notes (Signed)
As patient leaving she saw staff who she interacted with in triage. Patient smiling and advised them that she is "already starting to feel better" and that her "pain is better".

## 2014-08-20 NOTE — ED Notes (Signed)

## 2014-08-20 NOTE — Telephone Encounter (Signed)
She went to ER yesterday (or last night).  Will need more info.

## 2014-08-20 NOTE — ED Notes (Signed)
Patient upset because she has been here "5 hours". Patient informed that she had not been here for that long and that interventions were being provided per MD order. Of note, patient has been here just over 3 hours at this point.

## 2014-08-21 ENCOUNTER — Other Ambulatory Visit: Payer: Medicare Other

## 2014-08-21 LAB — URINE CULTURE: Special Requests: NORMAL

## 2014-08-25 ENCOUNTER — Other Ambulatory Visit (INDEPENDENT_AMBULATORY_CARE_PROVIDER_SITE_OTHER): Payer: Medicare Other

## 2014-08-25 DIAGNOSIS — Z7901 Long term (current) use of anticoagulants: Secondary | ICD-10-CM

## 2014-08-26 LAB — PROTIME-INR
INR: 1.63 — ABNORMAL HIGH (ref <1.50)
Prothrombin Time: 19.3 seconds — ABNORMAL HIGH (ref 11.6–15.2)

## 2014-08-28 ENCOUNTER — Encounter: Payer: Medicare Other | Admitting: Internal Medicine

## 2014-09-01 ENCOUNTER — Other Ambulatory Visit (INDEPENDENT_AMBULATORY_CARE_PROVIDER_SITE_OTHER): Payer: Medicare Other

## 2014-09-01 DIAGNOSIS — Z7901 Long term (current) use of anticoagulants: Secondary | ICD-10-CM

## 2014-09-01 LAB — PROTIME-INR
INR: 1.6 ratio — ABNORMAL HIGH (ref 0.8–1.0)
Prothrombin Time: 17.9 s — ABNORMAL HIGH (ref 9.6–13.1)

## 2014-09-02 ENCOUNTER — Encounter: Payer: Self-pay | Admitting: *Deleted

## 2014-09-14 ENCOUNTER — Other Ambulatory Visit (INDEPENDENT_AMBULATORY_CARE_PROVIDER_SITE_OTHER): Payer: Medicare Other

## 2014-09-14 DIAGNOSIS — Z7901 Long term (current) use of anticoagulants: Secondary | ICD-10-CM

## 2014-09-14 LAB — PROTIME-INR
INR: 2.9 ratio — ABNORMAL HIGH (ref 0.8–1.0)
PROTHROMBIN TIME: 31.4 s — AB (ref 9.6–13.1)

## 2014-09-15 ENCOUNTER — Ambulatory Visit (INDEPENDENT_AMBULATORY_CARE_PROVIDER_SITE_OTHER): Payer: Medicare Other | Admitting: Internal Medicine

## 2014-09-15 ENCOUNTER — Other Ambulatory Visit: Payer: Self-pay | Admitting: Internal Medicine

## 2014-09-15 VITALS — BP 110/68 | HR 97 | Temp 98.2°F | Ht 63.58 in | Wt 220.0 lb

## 2014-09-15 DIAGNOSIS — R05 Cough: Secondary | ICD-10-CM | POA: Diagnosis not present

## 2014-09-15 DIAGNOSIS — Z0000 Encounter for general adult medical examination without abnormal findings: Secondary | ICD-10-CM

## 2014-09-15 DIAGNOSIS — Z658 Other specified problems related to psychosocial circumstances: Secondary | ICD-10-CM

## 2014-09-15 DIAGNOSIS — E7800 Pure hypercholesterolemia, unspecified: Secondary | ICD-10-CM

## 2014-09-15 DIAGNOSIS — J4520 Mild intermittent asthma, uncomplicated: Secondary | ICD-10-CM

## 2014-09-15 DIAGNOSIS — R059 Cough, unspecified: Secondary | ICD-10-CM

## 2014-09-15 DIAGNOSIS — F439 Reaction to severe stress, unspecified: Secondary | ICD-10-CM

## 2014-09-15 DIAGNOSIS — I10 Essential (primary) hypertension: Secondary | ICD-10-CM | POA: Diagnosis not present

## 2014-09-15 DIAGNOSIS — I82602 Acute embolism and thrombosis of unspecified veins of left upper extremity: Secondary | ICD-10-CM | POA: Diagnosis not present

## 2014-09-15 DIAGNOSIS — M5442 Lumbago with sciatica, left side: Secondary | ICD-10-CM

## 2014-09-15 MED ORDER — PREDNISONE 10 MG PO TABS: ORAL_TABLET | ORAL | Status: DC

## 2014-09-15 NOTE — Progress Notes (Signed)
[name], female   DOB: 1945-02-08, 69 y.o.   MRN: [id number]   Subjective:    [name], female    DOB: September 07, 1945, 69 y.o.   MRN: [id number]  HPI  Patient here to follow up on her current medical issues as well as for a complete physical exam.  She states stress is better.  She does not feel she needs any further intervention.  She does report increased cough and congestion.  Left ear feels a little full.  She is using her flovent bid and using her albuterol inhaler q day.  Still having persistent cough.  No fever.  No sinus pressure.  No cardiac symptoms with increased activity or exertion.  No nausea or vomiting.  Bowels stable.     Past Medical History  Diagnosis Date  . Asthma   . Allergy   . Hypercholesterolemia   . Hypertension   . Chicken pox   . History of blood clots     Family history and social history reviewed.    Outpatient Encounter Prescriptions as of 09/15/2014  Medication Sig  . albuterol (PROVENTIL HFA) 108 (90 BASE) MCG/ACT inhaler Inhale 2 puffs into the lungs every 6 (six) hours as needed for wheezing.  . fluticasone (FLOVENT HFA) 110 MCG/ACT inhaler Inhale 2 puffs into the lungs 2 (two) times daily.  Marland Kitchen losartan (COZAAR) 50 MG tablet TAKE ONE (1) TABLET EACH DAY  . pantoprazole (PROTONIX) 40 MG tablet TAKE ONE (1) TABLET EACH DAY  . warfarin (COUMADIN) 5 MG tablet TAKE ONE (1) TABLET EACH DAY (Patient taking differently: TAKE ONE (1) TABLET ON TUES, THURS,  SATURDAY. TAKE 1/2 TABLET ALL OTHER DAYS)  . [DISCONTINUED] citalopram (CELEXA) 20 MG tablet TAKE ONE (1) TABLET EACH DAY  . oxybutynin (DITROPAN) 5 MG tablet Take 1 tablet (5 mg total) by mouth every 8 (eight) hours as needed for bladder spasms. (Patient not taking: Reported on 09/15/2014)  . predniSONE (DELTASONE) 10 MG tablet Take 4 tablets x 1 day and then decrease by 1/2 tablet per day until down to zero mg.  . [DISCONTINUED] nitrofurantoin, macrocrystal-monohydrate, (MACROBID)  100 MG capsule Take 1 capsule (100 mg total) by mouth 2 (two) times daily.  . [DISCONTINUED] predniSONE (DELTASONE) 10 MG tablet Take 6 tablets x 1 day and then decrease by 1/2 tablet per day until down to zero mg.   No facility-administered encounter medications on file as of 09/15/2014.    Review of Systems  Constitutional: Negative for appetite change and unexpected weight change.  HENT: Negative for sinus pressure and sore throat.        Some ear fullness.    Eyes: Negative for pain and visual disturbance.  Respiratory: Positive for cough and wheezing. Negative for chest tightness and shortness of breath.   Cardiovascular: Negative for chest pain and palpitations.  Gastrointestinal: Negative for nausea, vomiting, abdominal pain and diarrhea.  Genitourinary: Negative for frequency and difficulty urinating.  Musculoskeletal: Negative for back pain (back is doing better.  ) and joint swelling.  Skin: Negative for color change and rash.  Neurological: Negative for dizziness, light-headedness and headaches.  Hematological: Negative for adenopathy. Does not bruise/bleed easily.  Psychiatric/Behavioral: Negative for dysphoric mood and agitation.       Objective:     Blood pressure recheck:  136/82  Physical Exam  Constitutional: She is oriented to person, place, and time. She appears well-developed and well-nourished. No distress.  HENT:  Nose: Nose normal.  Mouth/Throat: Oropharynx is clear and moist.  Eyes: Right eye exhibits no discharge. Left eye exhibits no discharge. No scleral icterus.  Neck: Neck supple. No thyromegaly present.  Cardiovascular: Normal rate and regular rhythm.   Pulmonary/Chest: Breath sounds normal. No accessory muscle usage. No tachypnea. No respiratory distress. She has no decreased breath sounds. She has no wheezes. She has no rhonchi. Right breast exhibits no inverted nipple, no mass, no nipple discharge and no tenderness (no axillary adenopathy). Left  breast exhibits no inverted nipple, no mass, no nipple discharge and no tenderness (no axilarry adenopathy).  Abdominal: Soft. Bowel sounds are normal. There is no tenderness.  Musculoskeletal: She exhibits no edema or tenderness.  Lymphadenopathy:    She has no cervical adenopathy.  Neurological: She is alert and oriented to person, place, and time.  Skin: Skin is warm. No rash noted. No erythema.  Psychiatric: She has a normal mood and affect. Her behavior is normal.    BP 110/68 mmHg  Pulse 97  Temp(Src) 98.2 F (36.8 C) (Oral)  Ht 5' 3.58" (1.615 m)  Wt 220 lb (99.791 kg)  BMI 38.26 kg/m2  SpO2 94%  LMP 04/29/1997 Wt Readings from Last 3 Encounters:  09/15/14 220 lb (99.791 kg)  08/19/14 215 lb (97.523 kg)  08/06/14 218 lb 3.2 oz (98.975 kg)     Lab Results  Component Value Date   WBC 7.3 11/13/2013   HGB 13.4 11/13/2013   HCT 40.5 11/13/2013   PLT 282.0 11/13/2013   GLUCOSE 92 02/23/2013   CHOL 260* 04/29/2012   TRIG 212.0* 04/29/2012   HDL 47.00 04/29/2012   LDLDIRECT 197.8 04/29/2012   ALT 11 03/13/2013   AST 17 03/13/2013   NA 138 02/23/2013   K 3.9 02/23/2013   CL 106 02/23/2013   CREATININE 1.04 02/23/2013   BUN 10 02/23/2013   CO2 28 02/23/2013   TSH 2.78 08/13/2012   INR 2.9* 09/14/2014       Assessment & Plan:   Problem List Items Addressed This Visit    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  Follow pt/inr.        Relevant Orders   Protime-INR   Asthma    With increased cough and congestion.  Persistent.  Cough with forced expiration.  Do not feel abx warranted.  Prednisone taper as directed.  flovent and albuterol as directed.  Robitussin as directed.  Notify me if symptoms persist.        Relevant Medications   predniSONE (DELTASONE) 10 MG tablet   Back pain    Doing better.  Follow.        Relevant Medications   predniSONE (DELTASONE) 10 MG tablet   Cough    Symptoms as outlined.   Treat with continued inhalers as directed, prednisone taper as directed.  Do not feel abx warranted at this time.  Follow.        Essential hypertension, benign    Blood pressure doing well.  Same medication regimen.  Follow pressures.  Follow metabolic panel.        Relevant Orders   TSH   CBC with Differential/Platelet   Basic metabolic panel   Health care maintenance    Physical today 09/15/14.  Mammogram 05/15/14 - Birads I.  Colonoscopy per report - 2012.  She states recommended f/u in 10 years from last.       Hypercholesterolemia    Low cholesterol diet and  exercise.  Follow lipid panel.        Relevant Orders   Lipid panel   Hepatic function panel   Stress    Doing better.  On citalopram.  Follow.         Other Visit Diagnoses    Routine general medical examination at a health care facility    -  Primary        Einar Pheasant, MD

## 2014-09-15 NOTE — Progress Notes (Signed)
Pre visit review using our clinic review tool, if applicable. No additional management support is needed unless otherwise documented below in the visit note. 

## 2014-09-16 NOTE — Telephone Encounter (Signed)
Refilled citalopram #30 with 2 refills.  

## 2014-09-16 NOTE — Telephone Encounter (Signed)
Pt seen 8.16.16.  Please advise refill

## 2014-09-20 ENCOUNTER — Encounter: Payer: Self-pay | Admitting: Internal Medicine

## 2014-09-20 DIAGNOSIS — Z0000 Encounter for general adult medical examination without abnormal findings: Secondary | ICD-10-CM | POA: Insufficient documentation

## 2014-09-20 NOTE — Assessment & Plan Note (Signed)
Symptoms as outlined.  Treat with continued inhalers as directed, prednisone taper as directed.  Do not feel abx warranted at this time.  Follow.

## 2014-09-20 NOTE — Assessment & Plan Note (Signed)
Physical today 09/15/14.  Mammogram 05/15/14 - Birads I.  Colonoscopy per report - 2012.  She states recommended f/u in 10 years from last.

## 2014-09-20 NOTE — Assessment & Plan Note (Signed)
Low cholesterol diet and exercise.  Follow lipid panel.   

## 2014-09-20 NOTE — Assessment & Plan Note (Signed)
Blood pressure doing well.  Same medication regimen.  Follow pressures.  Follow metabolic panel.   

## 2014-09-20 NOTE — Assessment & Plan Note (Signed)
Doing better.  Follow.   

## 2014-09-20 NOTE — Assessment & Plan Note (Signed)
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  Follow pt/inr.

## 2014-09-20 NOTE — Assessment & Plan Note (Signed)
Doing better.  On citalopram.  Follow.   

## 2014-09-20 NOTE — Assessment & Plan Note (Signed)
With increased cough and congestion.  Persistent.  Cough with forced expiration.  Do not feel abx warranted.  Prednisone taper as directed.  flovent and albuterol as directed.  Robitussin as directed.  Notify me if symptoms persist.

## 2014-09-22 ENCOUNTER — Other Ambulatory Visit (INDEPENDENT_AMBULATORY_CARE_PROVIDER_SITE_OTHER): Payer: Medicare Other

## 2014-09-22 DIAGNOSIS — I82602 Acute embolism and thrombosis of unspecified veins of left upper extremity: Secondary | ICD-10-CM | POA: Diagnosis not present

## 2014-09-22 DIAGNOSIS — I10 Essential (primary) hypertension: Secondary | ICD-10-CM | POA: Diagnosis not present

## 2014-09-22 DIAGNOSIS — E7800 Pure hypercholesterolemia, unspecified: Secondary | ICD-10-CM

## 2014-09-22 LAB — HEPATIC FUNCTION PANEL
ALK PHOS: 54 U/L (ref 39–117)
ALT: 12 U/L (ref 0–35)
AST: 15 U/L (ref 0–37)
Albumin: 3.8 g/dL (ref 3.5–5.2)
Bilirubin, Direct: 0.1 mg/dL (ref 0.0–0.3)
TOTAL PROTEIN: 6.7 g/dL (ref 6.0–8.3)
Total Bilirubin: 0.8 mg/dL (ref 0.2–1.2)

## 2014-09-22 LAB — LIPID PANEL
CHOLESTEROL: 230 mg/dL — AB (ref 0–200)
HDL: 75.6 mg/dL (ref >39.00)
LDL Cholesterol: 120 mg/dL — ABNORMAL HIGH (ref 0–99)
NonHDL: 154.81
TRIGLYCERIDES: 173 mg/dL — AB (ref 0.0–149.0)
Total CHOL/HDL Ratio: 3
VLDL: 34.6 mg/dL (ref 0.0–40.0)

## 2014-09-22 LAB — CBC WITH DIFFERENTIAL/PLATELET
BASOS PCT: 0.4 % (ref 0.0–3.0)
Basophils Absolute: 0 10*3/uL (ref 0.0–0.1)
EOS ABS: 0.3 10*3/uL (ref 0.0–0.7)
EOS PCT: 2.7 % (ref 0.0–5.0)
HEMATOCRIT: 39.5 % (ref 36.0–46.0)
Hemoglobin: 13 g/dL (ref 12.0–15.0)
LYMPHS PCT: 40.3 % (ref 12.0–46.0)
Lymphs Abs: 4.7 10*3/uL — ABNORMAL HIGH (ref 0.7–4.0)
MCHC: 33.1 g/dL (ref 30.0–36.0)
MCV: 86.3 fl (ref 78.0–100.0)
Monocytes Absolute: 0.8 10*3/uL (ref 0.1–1.0)
Monocytes Relative: 7 % (ref 3.0–12.0)
Neutro Abs: 5.8 10*3/uL (ref 1.4–7.7)
Neutrophils Relative %: 49.6 % (ref 43.0–77.0)
Platelets: 286 10*3/uL (ref 150.0–400.0)
RBC: 4.57 Mil/uL (ref 3.87–5.11)
RDW: 14.1 % (ref 11.5–15.5)
WBC: 11.8 10*3/uL — AB (ref 4.0–10.5)

## 2014-09-22 LAB — BASIC METABOLIC PANEL
BUN: 18 mg/dL (ref 6–23)
CO2: 30 mEq/L (ref 19–32)
Calcium: 9 mg/dL (ref 8.4–10.5)
Chloride: 101 mEq/L (ref 96–112)
Creatinine, Ser: 0.92 mg/dL (ref 0.40–1.20)
GFR: 63.97 mL/min (ref >60.00)
GLUCOSE: 80 mg/dL (ref 70–99)
POTASSIUM: 4.1 meq/L (ref 3.5–5.1)
SODIUM: 138 meq/L (ref 135–145)

## 2014-09-22 LAB — PROTIME-INR
INR: 2.5 ratio — ABNORMAL HIGH (ref 0.8–1.0)
PROTHROMBIN TIME: 27 s — AB (ref 9.6–13.1)

## 2014-09-22 LAB — TSH: TSH: 5.31 u[IU]/mL — ABNORMAL HIGH (ref 0.35–4.50)

## 2014-09-23 ENCOUNTER — Encounter: Payer: Self-pay | Admitting: *Deleted

## 2014-09-23 ENCOUNTER — Other Ambulatory Visit: Payer: Self-pay | Admitting: Internal Medicine

## 2014-10-06 ENCOUNTER — Other Ambulatory Visit (INDEPENDENT_AMBULATORY_CARE_PROVIDER_SITE_OTHER): Payer: Medicare Other

## 2014-10-06 DIAGNOSIS — Z7901 Long term (current) use of anticoagulants: Secondary | ICD-10-CM

## 2014-10-06 LAB — PROTIME-INR
INR: 2.2 ratio — ABNORMAL HIGH (ref 0.8–1.0)
Prothrombin Time: 23.6 s — ABNORMAL HIGH (ref 9.6–13.1)

## 2014-10-07 ENCOUNTER — Encounter: Payer: Self-pay | Admitting: *Deleted

## 2014-10-12 ENCOUNTER — Telehealth: Payer: Self-pay | Admitting: *Deleted

## 2014-10-12 NOTE — Telephone Encounter (Signed)
Pt requesting appt for sore throat & white spots in throat. Please advise of date/time & have front staff schedule appt.

## 2014-10-13 NOTE — Telephone Encounter (Signed)
I can see her on 10/15/14 at 8:00 - block 30 minutes.  If needs to be seen earlier, let me know.

## 2014-10-13 NOTE — Telephone Encounter (Signed)
Pt scheduled  

## 2014-10-15 ENCOUNTER — Encounter: Payer: Self-pay | Admitting: Internal Medicine

## 2014-10-15 ENCOUNTER — Ambulatory Visit (INDEPENDENT_AMBULATORY_CARE_PROVIDER_SITE_OTHER): Payer: Medicare Other | Admitting: Internal Medicine

## 2014-10-15 VITALS — BP 110/70 | HR 68 | Temp 98.1°F | Ht 63.5 in | Wt 217.8 lb

## 2014-10-15 DIAGNOSIS — J029 Acute pharyngitis, unspecified: Secondary | ICD-10-CM

## 2014-10-15 DIAGNOSIS — R05 Cough: Secondary | ICD-10-CM

## 2014-10-15 DIAGNOSIS — Z7901 Long term (current) use of anticoagulants: Secondary | ICD-10-CM

## 2014-10-15 DIAGNOSIS — I10 Essential (primary) hypertension: Secondary | ICD-10-CM | POA: Diagnosis not present

## 2014-10-15 DIAGNOSIS — Z91048 Other nonmedicinal substance allergy status: Secondary | ICD-10-CM | POA: Diagnosis not present

## 2014-10-15 DIAGNOSIS — Z9109 Other allergy status, other than to drugs and biological substances: Secondary | ICD-10-CM

## 2014-10-15 DIAGNOSIS — R059 Cough, unspecified: Secondary | ICD-10-CM

## 2014-10-15 LAB — PROTIME-INR
INR: 2.2 ratio — AB (ref 0.8–1.0)
Prothrombin Time: 23.9 s — ABNORMAL HIGH (ref 9.6–13.1)

## 2014-10-15 MED ORDER — NYSTATIN 100000 UNIT/ML MT SUSP: OROMUCOSAL | Status: DC

## 2014-10-15 NOTE — Progress Notes (Signed)
Patient ID: DOSHA BROSHEARS, female   DOB: Feb 18, 1945, 69 y.o.   MRN: [id number]   Subjective:    Patient ID: Tawny Asal Korver, female    DOB: [birth year], 69 y.o.   MRN: [id number]  HPI  Patient here as a work in with concerns regarding sore throat and white spots in her mouth.  She reports that starting two to three days ago, she developed a sore throat. Her throat is not sore now.  She does still have some white patches in her throat.  Left ear started hurting.  Better now.  Flares intermittently.  Able to swallow.  Some residual cough.  No documented fever.  Some increased drainage.  No sob.  No vomiting.  Bowels stable.     Past Medical History  Diagnosis Date  . Asthma   . Allergy   . Hypercholesterolemia   . Hypertension   . Chicken pox   . History of blood clots    Past Surgical History  Procedure Laterality Date  . Blood clots  2008   Family History  Problem Relation Age of Onset  . Cancer Mother     Breast  . Heart disease Mother   . Stroke Mother   . Hypertension Mother   . Heart disease Father   . Stroke Father   . Hypertension Father   . Diabetes Father   . Colon cancer      paternal cousin   Social History   Social History  . Marital Status: Widowed    Spouse Name: N/A  . Number of Children: N/A  . Years of Education: N/A   Social History Main Topics  . Smoking status: Never Smoker   . Smokeless tobacco: Never Used  . Alcohol Use: No  . Drug Use: No  . Sexual Activity: Not Asked   Other Topics Concern  . None   Social History Narrative     Review of Systems  Constitutional: Negative for appetite change and unexpected weight change.  HENT: Positive for ear pain, postnasal drip and sore throat.   Eyes: Negative for pain and discharge.  Respiratory: Positive for cough. Negative for chest tightness and shortness of breath.   Cardiovascular: Negative for chest pain, palpitations and leg swelling.  Gastrointestinal: Negative for nausea and vomiting.    Neurological: Negative for light-headedness and headaches.       Objective:    Physical Exam  Constitutional: She appears well-developed and well-nourished. No distress.  HENT:  Nose: Nose normal.  TMs without erythema.  Minimal sinus tenderness to palpation.  Few white patches - posterior oropharynx.   Eyes: Conjunctivae are normal. Right eye exhibits no discharge. Left eye exhibits no discharge.  Neck: Neck supple.  Cardiovascular: Normal rate and regular rhythm.   Pulmonary/Chest: Breath sounds normal. No respiratory distress. She has no wheezes.  Some increased cough with forced expiration.   Abdominal: Soft. Bowel sounds are normal.  Lymphadenopathy:    She has no cervical adenopathy.    BP 110/70 mmHg  Pulse 68  Temp(Src) 98.1 F (36.7 C) (Oral)  Ht 5' 3.5" (1.613 m)  Wt 217 lb 12 oz (98.771 kg)  BMI 37.96 kg/m2  SpO2 95%  LMP 04/29/1997 Wt Readings from Last 3 Encounters:  10/15/14 217 lb 12 oz (98.771 kg)  09/15/14 220 lb (99.791 kg)  08/19/14 215 lb (97.523 kg)     Lab Results  Component Value Date   WBC 11.8* 09/22/2014   HGB 13.0 09/22/2014   HCT  39.5 09/22/2014   PLT 286.0 09/22/2014   GLUCOSE 80 09/22/2014   CHOL 230* 09/22/2014   TRIG 173.0* 09/22/2014   HDL 75.60 09/22/2014   LDLDIRECT 197.8 04/29/2012   LDLCALC 120* 09/22/2014   ALT 12 09/22/2014   AST 15 09/22/2014   NA 138 09/22/2014   K 4.1 09/22/2014   CL 101 09/22/2014   CREATININE 0.92 09/22/2014   BUN 18 09/22/2014   CO2 30 09/22/2014   TSH 5.31* 09/22/2014   INR 2.2* 10/15/2014       Assessment & Plan:   Problem List Items Addressed This Visit    Cough    Still with some occasional cough.  flovent inhaler and albuterol inhaler as directed.  Treat drainage as outlined.  Follow.        Environmental allergies    Saline nasal spray and flonase as directed.  Follow.       Essential hypertension, benign    Blood pressure under good control.  Continue same medication  regimen.  Follow pressures.  Follow metabolic panel.         Long term current use of anticoagulant therapy - Primary   Relevant Orders   INR/PT (Completed)   Sore throat    Sore throat and exam as outlined.  Nystatin swish and spit tid.  Follow.            Einar Pheasant, MD

## 2014-10-15 NOTE — Progress Notes (Signed)
Pre-visit discussion using our clinic review tool. No additional management support is needed unless otherwise documented below in the visit note.  

## 2014-10-15 NOTE — Patient Instructions (Signed)
Saline nasal spray - flush nose at least 2-3x/day  flonase nasal spray - two sprays each nostril one time per day.  Do this in the evening.    Use the flovent inhaler - 2 puffs twice a day.  Rinse mouth after use.    Robitussin as needed.

## 2014-10-16 ENCOUNTER — Other Ambulatory Visit: Payer: Medicare Other

## 2014-10-17 ENCOUNTER — Encounter: Payer: Self-pay | Admitting: Internal Medicine

## 2014-10-17 DIAGNOSIS — J029 Acute pharyngitis, unspecified: Secondary | ICD-10-CM | POA: Insufficient documentation

## 2014-10-17 NOTE — Assessment & Plan Note (Signed)
Blood pressure under good control.  Continue same medication regimen.  Follow pressures.  Follow metabolic panel.   

## 2014-10-17 NOTE — Assessment & Plan Note (Signed)
Saline nasal spray and flonase as directed.  Follow.   

## 2014-10-17 NOTE — Assessment & Plan Note (Signed)
Sore throat and exam as outlined.  Nystatin swish and spit tid.  Follow.

## 2014-10-17 NOTE — Assessment & Plan Note (Signed)
Still with some occasional cough.  flovent inhaler and albuterol inhaler as directed.  Treat drainage as outlined.  Follow.

## 2014-11-05 ENCOUNTER — Other Ambulatory Visit (INDEPENDENT_AMBULATORY_CARE_PROVIDER_SITE_OTHER): Payer: Medicare Other

## 2014-11-05 DIAGNOSIS — Z7901 Long term (current) use of anticoagulants: Secondary | ICD-10-CM | POA: Diagnosis not present

## 2014-11-05 LAB — PROTIME-INR
INR: 2.4 ratio — ABNORMAL HIGH (ref 0.8–1.0)
PROTHROMBIN TIME: 26.5 s — AB (ref 9.6–13.1)

## 2014-12-03 ENCOUNTER — Other Ambulatory Visit (INDEPENDENT_AMBULATORY_CARE_PROVIDER_SITE_OTHER): Payer: Medicare Other

## 2014-12-03 DIAGNOSIS — Z7901 Long term (current) use of anticoagulants: Secondary | ICD-10-CM | POA: Diagnosis not present

## 2014-12-03 LAB — PROTIME-INR
INR: 1.9 ratio — ABNORMAL HIGH (ref 0.8–1.0)
Prothrombin Time: 20.8 s — ABNORMAL HIGH (ref 9.6–13.1)

## 2014-12-04 ENCOUNTER — Encounter: Payer: Self-pay | Admitting: Internal Medicine

## 2014-12-10 ENCOUNTER — Other Ambulatory Visit (INDEPENDENT_AMBULATORY_CARE_PROVIDER_SITE_OTHER): Payer: Medicare Other

## 2014-12-10 DIAGNOSIS — Z7901 Long term (current) use of anticoagulants: Secondary | ICD-10-CM | POA: Diagnosis not present

## 2014-12-10 LAB — PROTIME-INR
INR: 2.2 ratio — AB (ref 0.8–1.0)
Prothrombin Time: 23.7 s — ABNORMAL HIGH (ref 9.6–13.1)

## 2014-12-16 ENCOUNTER — Ambulatory Visit (INDEPENDENT_AMBULATORY_CARE_PROVIDER_SITE_OTHER): Payer: Medicare Other | Admitting: Internal Medicine

## 2014-12-16 ENCOUNTER — Encounter: Payer: Self-pay | Admitting: Internal Medicine

## 2014-12-16 VITALS — BP 128/80 | HR 70 | Temp 98.0°F | Resp 18 | Ht 63.5 in | Wt 222.0 lb

## 2014-12-16 DIAGNOSIS — J4520 Mild intermittent asthma, uncomplicated: Secondary | ICD-10-CM | POA: Diagnosis not present

## 2014-12-16 DIAGNOSIS — Z658 Other specified problems related to psychosocial circumstances: Secondary | ICD-10-CM | POA: Diagnosis not present

## 2014-12-16 DIAGNOSIS — I82602 Acute embolism and thrombosis of unspecified veins of left upper extremity: Secondary | ICD-10-CM | POA: Diagnosis not present

## 2014-12-16 DIAGNOSIS — F439 Reaction to severe stress, unspecified: Secondary | ICD-10-CM

## 2014-12-16 DIAGNOSIS — I10 Essential (primary) hypertension: Secondary | ICD-10-CM

## 2014-12-16 DIAGNOSIS — E7800 Pure hypercholesterolemia, unspecified: Secondary | ICD-10-CM

## 2014-12-16 NOTE — Progress Notes (Signed)
Pre-visit discussion using our clinic review tool. No additional management support is needed unless otherwise documented below in the visit note.  

## 2014-12-16 NOTE — Progress Notes (Signed)
Patient ID: HEERA WENIG, female   DOB: [birth year], 69 y.o.   MRN: [id number]   Subjective:    Patient ID: Tawny Asal Gerke, female    DOB: 07-Jun-1945, 69 y.o.   MRN: [id number]  HPI  Patient with past history of hypercholesterolemia, asthma and hypertension.  She comes in today to follow up on these issues.  The person she has been sitting with for the past year passed away recently.  She feels she is handling this relatively well.  Some increased stress related to this.  She feels she is handling this relatively well.  Tries to stay active.  No cardiac symptoms with increased activity or exertion.  Breathing stable.  No acid reflux reported.  No abdominal pain or cramping.  Bowels stable.     Past Medical History  Diagnosis Date  . Asthma   . Allergy   . Hypercholesterolemia   . Hypertension   . Chicken pox   . History of blood clots    Past Surgical History  Procedure Laterality Date  . Blood clots  2008   Family History  Problem Relation Age of Onset  . Cancer Mother     Breast  . Heart disease Mother   . Stroke Mother   . Hypertension Mother   . Heart disease Father   . Stroke Father   . Hypertension Father   . Diabetes Father   . Colon cancer      paternal cousin   Social History   Social History  . Marital Status: Widowed    Spouse Name: N/A  . Number of Children: N/A  . Years of Education: N/A   Social History Main Topics  . Smoking status: Never Smoker   . Smokeless tobacco: Never Used  . Alcohol Use: No  . Drug Use: No  . Sexual Activity: Not Asked   Other Topics Concern  . None   Social History Narrative    Outpatient Encounter Prescriptions as of 12/16/2014  Medication Sig  . albuterol (PROVENTIL HFA) 108 (90 BASE) MCG/ACT inhaler Inhale 2 puffs into the lungs every 6 (six) hours as needed for wheezing.  . fluticasone (FLOVENT HFA) 110 MCG/ACT inhaler Inhale 2 puffs into the lungs 2 (two) times daily.  Marland Kitchen losartan (COZAAR) 50 MG tablet TAKE ONE (1)  TABLET EACH DAY  . nystatin (MYCOSTATIN) 100000 UNIT/ML suspension 5cc's swish and spit tid.  . pantoprazole (PROTONIX) 40 MG tablet TAKE ONE (1) TABLET EACH DAY  . warfarin (COUMADIN) 5 MG tablet TAKE ONE (1) TABLET EACH DAY  . [DISCONTINUED] citalopram (CELEXA) 20 MG tablet TAKE 1 TABLET BY MOUTH EACH DAY  . [DISCONTINUED] oxybutynin (DITROPAN) 5 MG tablet Take 1 tablet (5 mg total) by mouth every 8 (eight) hours as needed for bladder spasms.  . [DISCONTINUED] predniSONE (DELTASONE) 10 MG tablet Take 4 tablets x 1 day and then decrease by 1/2 tablet per day until down to zero mg.   No facility-administered encounter medications on file as of 12/16/2014.    Review of Systems  Constitutional: Negative for appetite change and unexpected weight change.  HENT: Negative for congestion and sinus pressure.   Respiratory: Negative for cough, chest tightness and shortness of breath.   Cardiovascular: Negative for chest pain, palpitations and leg swelling.  Gastrointestinal: Negative for nausea, vomiting, abdominal pain and diarrhea.  Genitourinary: Negative for dysuria and difficulty urinating.  Musculoskeletal: Negative for joint swelling and neck stiffness.  Skin: Negative for color change and rash.  Neurological:  Negative for dizziness, light-headedness and headaches.  Psychiatric/Behavioral: Negative for dysphoric mood and agitation.       Objective:    Physical Exam  Constitutional: She appears well-developed and well-nourished. No distress.  HENT:  Nose: Nose normal.  Mouth/Throat: Oropharynx is clear and moist.  Eyes: Conjunctivae are normal. Right eye exhibits no discharge. Left eye exhibits no discharge.  Neck: Neck supple. No thyromegaly present.  Cardiovascular: Normal rate and regular rhythm.   Pulmonary/Chest: Breath sounds normal. No respiratory distress. She has no wheezes.  Abdominal: Soft. Bowel sounds are normal. There is no tenderness.  Musculoskeletal: She exhibits no  edema or tenderness.  Lymphadenopathy:    She has no cervical adenopathy.  Skin: No rash noted. No erythema.  Psychiatric: She has a normal mood and affect. Her behavior is normal.    BP 128/80 mmHg  Pulse 70  Temp(Src) 98 F (36.7 C) (Oral)  Resp 18  Ht 5' 3.5" (1.613 m)  Wt 222 lb (100.699 kg)  BMI 38.70 kg/m2  SpO2 96%  LMP 04/29/1997 Wt Readings from Last 3 Encounters:  12/16/14 222 lb (100.699 kg)  10/15/14 217 lb 12 oz (98.771 kg)  09/15/14 220 lb (99.791 kg)     Lab Results  Component Value Date   WBC 11.8* 09/22/2014   HGB 13.0 09/22/2014   HCT 39.5 09/22/2014   PLT 286.0 09/22/2014   GLUCOSE 80 09/22/2014   CHOL 230* 09/22/2014   TRIG 173.0* 09/22/2014   HDL 75.60 09/22/2014   LDLDIRECT 197.8 04/29/2012   LDLCALC 120* 09/22/2014   ALT 12 09/22/2014   AST 15 09/22/2014   NA 138 09/22/2014   K 4.1 09/22/2014   CL 101 09/22/2014   CREATININE 0.92 09/22/2014   BUN 18 09/22/2014   CO2 30 09/22/2014   TSH 5.31* 09/22/2014   INR 2.2* 12/10/2014       Assessment & Plan:   Problem List Items Addressed This Visit    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  Follow pt/inr.      Asthma    Breathing stable.        Essential hypertension, benign - Primary    Blood pressure under good control.  Continue same medication regimen.  Follow pressures.  Follow metabolic panel.        Hypercholesterolemia    Low cholesterol diet and exercise.  Follow lipid panel.        Stress    Stable.  On citalopram.  Follow.            Einar Pheasant, MD

## 2014-12-16 NOTE — Patient Instructions (Signed)

## 2014-12-21 ENCOUNTER — Other Ambulatory Visit: Payer: Self-pay | Admitting: Internal Medicine

## 2014-12-21 NOTE — Telephone Encounter (Signed)
Last seen on 12/16/14. Okay to refill?

## 2014-12-21 NOTE — Telephone Encounter (Signed)
rx ok'd for citalopram #30 with 3 refills.

## 2014-12-26 ENCOUNTER — Encounter: Payer: Self-pay | Admitting: Internal Medicine

## 2014-12-26 NOTE — Assessment & Plan Note (Signed)
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  Follow pt/inr.

## 2014-12-26 NOTE — Assessment & Plan Note (Signed)
Breathing stable.

## 2014-12-26 NOTE — Assessment & Plan Note (Signed)
Stable.  On citalopram.  Follow.    

## 2014-12-26 NOTE — Assessment & Plan Note (Signed)
Blood pressure under good control.  Continue same medication regimen.  Follow pressures.  Follow metabolic panel.   

## 2014-12-26 NOTE — Assessment & Plan Note (Signed)
Low cholesterol diet and exercise.  Follow lipid panel.   

## 2014-12-31 ENCOUNTER — Other Ambulatory Visit (INDEPENDENT_AMBULATORY_CARE_PROVIDER_SITE_OTHER): Payer: Medicare Other

## 2014-12-31 DIAGNOSIS — Z7901 Long term (current) use of anticoagulants: Secondary | ICD-10-CM

## 2014-12-31 LAB — PROTIME-INR
INR: 2.4 ratio — AB (ref 0.8–1.0)
Prothrombin Time: 25.5 s — ABNORMAL HIGH (ref 9.6–13.1)

## 2015-01-01 ENCOUNTER — Other Ambulatory Visit: Payer: Self-pay

## 2015-01-28 ENCOUNTER — Other Ambulatory Visit (INDEPENDENT_AMBULATORY_CARE_PROVIDER_SITE_OTHER): Payer: Medicare Other

## 2015-01-28 DIAGNOSIS — Z7901 Long term (current) use of anticoagulants: Secondary | ICD-10-CM | POA: Diagnosis not present

## 2015-01-28 LAB — PROTIME-INR
INR: 2.8 ratio — AB (ref 0.8–1.0)
PROTHROMBIN TIME: 30.5 s — AB (ref 9.6–13.1)

## 2015-02-03 ENCOUNTER — Other Ambulatory Visit: Payer: Self-pay | Admitting: Internal Medicine

## 2015-02-08 ENCOUNTER — Other Ambulatory Visit: Payer: Self-pay

## 2015-02-22 ENCOUNTER — Other Ambulatory Visit (INDEPENDENT_AMBULATORY_CARE_PROVIDER_SITE_OTHER): Payer: Medicare Other

## 2015-02-22 DIAGNOSIS — Z7901 Long term (current) use of anticoagulants: Secondary | ICD-10-CM | POA: Diagnosis not present

## 2015-02-22 LAB — PROTIME-INR
INR: 3.8 ratio — AB (ref 0.8–1.0)
Prothrombin Time: 41 s — ABNORMAL HIGH (ref 9.6–13.1)

## 2015-02-24 ENCOUNTER — Telehealth: Payer: Self-pay | Admitting: *Deleted

## 2015-02-24 ENCOUNTER — Other Ambulatory Visit (INDEPENDENT_AMBULATORY_CARE_PROVIDER_SITE_OTHER): Payer: Medicare Other

## 2015-02-24 DIAGNOSIS — Z7901 Long term (current) use of anticoagulants: Secondary | ICD-10-CM

## 2015-02-24 LAB — PROTIME-INR
INR: 3.1 ratio — AB (ref 0.8–1.0)
Prothrombin Time: 33.7 s — ABNORMAL HIGH (ref 9.6–13.1)

## 2015-02-24 NOTE — Telephone Encounter (Signed)
Confirm with her that this is acetaminophen 650.  If so, ok.

## 2015-02-24 NOTE — Telephone Encounter (Signed)
Pt would like to know if she can take Mapap Arthritis pain - extended release tablets 650 mg

## 2015-02-24 NOTE — Telephone Encounter (Signed)
Yes, that is correct 

## 2015-02-25 NOTE — Telephone Encounter (Signed)
Ok to take one to two tablets twice a day as needed.

## 2015-02-25 NOTE — Telephone Encounter (Signed)
Pt notified via voice mail

## 2015-03-01 ENCOUNTER — Other Ambulatory Visit (INDEPENDENT_AMBULATORY_CARE_PROVIDER_SITE_OTHER): Payer: Medicare Other

## 2015-03-01 DIAGNOSIS — Z7901 Long term (current) use of anticoagulants: Secondary | ICD-10-CM | POA: Diagnosis not present

## 2015-03-01 LAB — PROTIME-INR
INR: 3.1 ratio — ABNORMAL HIGH (ref 0.8–1.0)
Prothrombin Time: 33.9 s — ABNORMAL HIGH (ref 9.6–13.1)

## 2015-03-09 ENCOUNTER — Telehealth: Payer: Self-pay | Admitting: Internal Medicine

## 2015-03-09 NOTE — Telephone Encounter (Signed)
Pt called wanting to know when the next time she would get lab work to check the PT/INR? Let me know. Call pt @ [phone]. Thank you!

## 2015-03-09 NOTE — Telephone Encounter (Signed)
Please schedule pt/inr now - see lab note.

## 2015-03-09 NOTE — Telephone Encounter (Signed)
Pt wants to know when to get her next INR/PT done. Please advise, thanks

## 2015-03-09 NOTE — Telephone Encounter (Signed)
See lab result!!! I left pt another message to return my call. She needs Pt/INR this week. And I need to know how she is taking her coumadin. It needs to be documented in her result note if possible.

## 2015-03-12 ENCOUNTER — Telehealth: Payer: Self-pay | Admitting: Internal Medicine

## 2015-03-12 NOTE — Telephone Encounter (Signed)
lmtcb to reschedule AWV to another day/msn

## 2015-03-17 ENCOUNTER — Encounter: Payer: Self-pay | Admitting: *Deleted

## 2015-03-18 ENCOUNTER — Ambulatory Visit (INDEPENDENT_AMBULATORY_CARE_PROVIDER_SITE_OTHER): Payer: Medicare Other | Admitting: Internal Medicine

## 2015-03-18 ENCOUNTER — Encounter: Payer: Self-pay | Admitting: Internal Medicine

## 2015-03-18 VITALS — BP 118/62 | HR 66 | Temp 97.8°F | Resp 14 | Ht 64.0 in | Wt 221.8 lb

## 2015-03-18 DIAGNOSIS — R946 Abnormal results of thyroid function studies: Secondary | ICD-10-CM

## 2015-03-18 DIAGNOSIS — E7800 Pure hypercholesterolemia, unspecified: Secondary | ICD-10-CM | POA: Diagnosis not present

## 2015-03-18 DIAGNOSIS — M5442 Lumbago with sciatica, left side: Secondary | ICD-10-CM

## 2015-03-18 DIAGNOSIS — I82602 Acute embolism and thrombosis of unspecified veins of left upper extremity: Secondary | ICD-10-CM

## 2015-03-18 DIAGNOSIS — D72829 Elevated white blood cell count, unspecified: Secondary | ICD-10-CM

## 2015-03-18 DIAGNOSIS — Z5181 Encounter for therapeutic drug level monitoring: Secondary | ICD-10-CM | POA: Diagnosis not present

## 2015-03-18 DIAGNOSIS — I10 Essential (primary) hypertension: Secondary | ICD-10-CM

## 2015-03-18 DIAGNOSIS — Z7901 Long term (current) use of anticoagulants: Secondary | ICD-10-CM

## 2015-03-18 DIAGNOSIS — R7989 Other specified abnormal findings of blood chemistry: Secondary | ICD-10-CM

## 2015-03-18 DIAGNOSIS — F439 Reaction to severe stress, unspecified: Secondary | ICD-10-CM

## 2015-03-18 DIAGNOSIS — Z658 Other specified problems related to psychosocial circumstances: Secondary | ICD-10-CM

## 2015-03-18 LAB — BASIC METABOLIC PANEL
BUN: 18 mg/dL (ref 6–23)
CO2: 30 mEq/L (ref 19–32)
CREATININE: 0.95 mg/dL (ref 0.40–1.20)
Calcium: 9.4 mg/dL (ref 8.4–10.5)
Chloride: 102 mEq/L (ref 96–112)
GFR: 61.56 mL/min (ref >60.00)
Glucose, Bld: 90 mg/dL (ref 70–99)
Potassium: 4.6 mEq/L (ref 3.5–5.1)
Sodium: 139 mEq/L (ref 135–145)

## 2015-03-18 LAB — PROTIME-INR
INR: 2.3 ratio — ABNORMAL HIGH (ref 0.8–1.0)
Prothrombin Time: 25.1 s — ABNORMAL HIGH (ref 9.6–13.1)

## 2015-03-18 LAB — LIPID PANEL
CHOLESTEROL: 263 mg/dL — AB (ref 0–200)
HDL: 49.1 mg/dL (ref >39.00)
NonHDL: 214.17
Total CHOL/HDL Ratio: 5
Triglycerides: 220 mg/dL — ABNORMAL HIGH (ref 0.0–149.0)
VLDL: 44 mg/dL — ABNORMAL HIGH (ref 0.0–40.0)

## 2015-03-18 LAB — HEPATIC FUNCTION PANEL
ALK PHOS: 59 U/L (ref 39–117)
ALT: 13 U/L (ref 0–35)
AST: 16 U/L (ref 0–37)
Albumin: 4.2 g/dL (ref 3.5–5.2)
BILIRUBIN DIRECT: 0.1 mg/dL (ref 0.0–0.3)
BILIRUBIN TOTAL: 0.8 mg/dL (ref 0.2–1.2)
Total Protein: 7.1 g/dL (ref 6.0–8.3)

## 2015-03-18 LAB — CBC WITH DIFFERENTIAL/PLATELET
BASOS PCT: 0.7 % (ref 0.0–3.0)
Basophils Absolute: 0 10*3/uL (ref 0.0–0.1)
EOS ABS: 0.3 10*3/uL (ref 0.0–0.7)
EOS PCT: 3.8 % (ref 0.0–5.0)
HCT: 42.2 % (ref 36.0–46.0)
Hemoglobin: 14.1 g/dL (ref 12.0–15.0)
LYMPHS ABS: 2.5 10*3/uL (ref 0.7–4.0)
Lymphocytes Relative: 35.5 % (ref 12.0–46.0)
MCHC: 33.5 g/dL (ref 30.0–36.0)
MCV: 86.1 fl (ref 78.0–100.0)
Monocytes Absolute: 0.4 10*3/uL (ref 0.1–1.0)
Monocytes Relative: 6.3 % (ref 3.0–12.0)
NEUTROS PCT: 53.7 % (ref 43.0–77.0)
Neutro Abs: 3.8 10*3/uL (ref 1.4–7.7)
PLATELETS: 272 10*3/uL (ref 150.0–400.0)
RBC: 4.9 Mil/uL (ref 3.87–5.11)
RDW: 13.8 % (ref 11.5–15.5)
WBC: 7 10*3/uL (ref 4.0–10.5)

## 2015-03-18 LAB — TSH: TSH: 2.49 u[IU]/mL (ref 0.35–4.50)

## 2015-03-18 LAB — LDL CHOLESTEROL, DIRECT: Direct LDL: 166 mg/dL

## 2015-03-18 NOTE — Progress Notes (Signed)
[name], female   DOB: [birth year], 70 y.o.   MRN: [id number]   Subjective:    [name], female    DOB: [birth year], 70 y.o.   MRN: [id number]  HPI  Patient with past history of hypercholesterolemia, asthma and hypertension.  She comes in today to follow up on these issues.  Was scheduled for a physical.  Had her physical in August. Here for a follow up.  Doing better.  Decreased stress.  Still has financial concerns, but overall handling things well.  Breathing stable.  No increased cough or congestion.  No chest pain or tightness.  Does report some knee discomfort.  Both knees.  No pain with walking.  When has been sitting for a while and goes to stand, will bother her until she starts moving.  No abdominal pain or cramping.  Back bothers her some.  Does not bother her enough now that she feels she needs any further intervention.  Taking tylenol.     Past Medical History  Diagnosis Date  . Asthma   . Allergy   . Hypercholesterolemia   . Hypertension   . Chicken pox   . History of blood clots    Past Surgical History  Procedure Laterality Date  . Blood clots  2008   Family History  Problem Relation Age of Onset  . Cancer Mother     Breast  . Heart disease Mother   . Stroke Mother   . Hypertension Mother   . Heart disease Father   . Stroke Father   . Hypertension Father   . Diabetes Father   . Colon cancer      paternal cousin   Social History   Social History  . Marital Status: Widowed    Spouse Name: N/A  . Number of Children: N/A  . Years of Education: N/A   Social History Main Topics  . Smoking status: Never Smoker   . Smokeless tobacco: Never Used  . Alcohol Use: No  . Drug Use: No  . Sexual Activity: Not Asked   Other Topics Concern  . None   Social History Narrative    Outpatient Encounter Prescriptions as of 03/18/2015  Medication Sig  . albuterol (PROVENTIL HFA) 108 (90 BASE) MCG/ACT inhaler Inhale 2 puffs into the lungs  every 6 (six) hours as needed for wheezing.  . citalopram (CELEXA) 20 MG tablet TAKE ONE (1) TABLET EACH DAY  . fluticasone (FLOVENT HFA) 110 MCG/ACT inhaler Inhale 2 puffs into the lungs 2 (two) times daily.  Marland Kitchen losartan (COZAAR) 50 MG tablet TAKE ONE (1) TABLET BY MOUTH EVERY DAY  . pantoprazole (PROTONIX) 40 MG tablet TAKE ONE (1) TABLET EACH DAY  . [DISCONTINUED] warfarin (COUMADIN) 5 MG tablet TAKE ONE (1) TABLET EACH DAY  . [DISCONTINUED] nystatin (MYCOSTATIN) 100000 UNIT/ML suspension 5cc's swish and spit tid.   No facility-administered encounter medications on file as of 03/18/2015.    Review of Systems  Constitutional: Negative for appetite change and unexpected weight change.  HENT: Negative for congestion and sinus pressure.   Respiratory: Negative for cough, chest tightness and shortness of breath.   Cardiovascular: Negative for chest pain, palpitations and leg swelling.  Gastrointestinal: Negative for nausea, vomiting, abdominal pain and diarrhea.  Genitourinary: Negative for dysuria and difficulty urinating.  Musculoskeletal: Negative for myalgias and joint swelling.  Skin: Negative for color change and rash.  Neurological: Negative for dizziness, light-headedness and headaches.  Psychiatric/Behavioral: Negative for dysphoric mood  and agitation.       Objective:    Physical Exam  Constitutional: She appears well-developed and well-nourished. No distress.  HENT:  Nose: Nose normal.  Mouth/Throat: Oropharynx is clear and moist.  Eyes: Right eye exhibits no discharge. Left eye exhibits no discharge.  Neck: Neck supple. No thyromegaly present.  Cardiovascular: Normal rate and regular rhythm.   Pulmonary/Chest: Breath sounds normal. No respiratory distress. She has no wheezes.  Abdominal: Soft. Bowel sounds are normal. There is no tenderness.  Musculoskeletal: She exhibits no edema or tenderness.  Lymphadenopathy:    She has no cervical adenopathy.  Skin: No rash noted.  No erythema.  Psychiatric: She has a normal mood and affect. Her behavior is normal.    BP 118/62 mmHg  Pulse 66  Temp(Src) 97.8 F (36.6 C) (Oral)  Resp 14  Ht 5\' 4"  (1.626 m)  Wt 221 lb 12.8 oz (100.608 kg)  BMI 38.05 kg/m2  SpO2 96%  LMP 04/29/1997 Wt Readings from Last 3 Encounters:  03/18/15 221 lb 12.8 oz (100.608 kg)  12/16/14 222 lb (100.699 kg)  10/15/14 217 lb 12 oz (98.771 kg)     Lab Results  Component Value Date   WBC 7.0 03/18/2015   HGB 14.1 03/18/2015   HCT 42.2 03/18/2015   PLT 272.0 03/18/2015   GLUCOSE 90 03/18/2015   CHOL 263* 03/18/2015   TRIG 220.0* 03/18/2015   HDL 49.10 03/18/2015   LDLDIRECT 166.0 03/18/2015   LDLCALC 120* 09/22/2014   ALT 13 03/18/2015   AST 16 03/18/2015   NA 139 03/18/2015   K 4.6 03/18/2015   CL 102 03/18/2015   CREATININE 0.95 03/18/2015   BUN 18 03/18/2015   CO2 30 03/18/2015   TSH 2.49 03/18/2015   INR 2.3* 03/18/2015       Assessment & Plan:   Problem List Items Addressed This Visit    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  Follow pt/inr.        Back pain    Stable.        Essential hypertension, benign - Primary    Blood pressure under good control.  Continue same medication regimen.  Follow pressures.  Follow metabolic panel.        Relevant Orders   Basic metabolic panel (Completed)   Hypercholesterolemia    Low cholesterol diet and exercise.  Follow lipid panel.        Relevant Orders   Lipid panel (Completed)   Hepatic function panel (Completed)   Stress    Stable.  On citalopram.  Follow.         Other Visit Diagnoses    Elevated TSH        Relevant Orders    TSH (Completed)    Anticoagulated on Coumadin        Relevant Orders    Protime-INR (Completed)    Leukocytosis        Relevant Orders    CBC with Differential/Platelet (Completed)        Einar Pheasant, MD

## 2015-03-18 NOTE — Progress Notes (Signed)
Pre visit review using our clinic review tool, if applicable. No additional management support is needed unless otherwise documented below in the visit note. 

## 2015-03-19 ENCOUNTER — Encounter: Payer: Self-pay | Admitting: *Deleted

## 2015-03-19 ENCOUNTER — Other Ambulatory Visit: Payer: Self-pay | Admitting: Internal Medicine

## 2015-03-21 ENCOUNTER — Encounter: Payer: Self-pay | Admitting: Internal Medicine

## 2015-03-21 NOTE — Assessment & Plan Note (Signed)
History of multiple clots.  On lifelong coumadin.  Follow pt/inr.

## 2015-03-21 NOTE — Assessment & Plan Note (Signed)
Low cholesterol diet and exercise.  Follow lipid panel.   

## 2015-03-21 NOTE — Assessment & Plan Note (Signed)
Stable.  On citalopram.  Follow.    

## 2015-03-21 NOTE — Assessment & Plan Note (Signed)
Stable

## 2015-03-21 NOTE — Assessment & Plan Note (Signed)
Blood pressure under good control.  Continue same medication regimen.  Follow pressures.  Follow metabolic panel.   

## 2015-03-22 ENCOUNTER — Other Ambulatory Visit: Payer: Self-pay | Admitting: *Deleted

## 2015-03-22 MED ORDER — ROSUVASTATIN CALCIUM 5 MG PO TABS: 5.0000 mg | ORAL_TABLET | Freq: Every day | ORAL | Status: DC

## 2015-03-22 NOTE — Telephone Encounter (Signed)
Sent in new Rx for Crestor 5mg  QD. Pt agreed to start & lab appt scheduled for liver panel in 6 weeks.

## 2015-03-27 ENCOUNTER — Telehealth: Payer: Self-pay | Admitting: *Deleted

## 2015-03-27 NOTE — Telephone Encounter (Signed)
Needs PT/INR order placed for Monday

## 2015-03-28 ENCOUNTER — Other Ambulatory Visit: Payer: Self-pay | Admitting: Internal Medicine

## 2015-03-28 DIAGNOSIS — Z7901 Long term (current) use of anticoagulants: Secondary | ICD-10-CM

## 2015-03-28 NOTE — Progress Notes (Signed)
Order placed for pt/inr 

## 2015-03-28 NOTE — Telephone Encounter (Signed)
Order placed for pt/inr 

## 2015-03-29 ENCOUNTER — Other Ambulatory Visit: Payer: Self-pay

## 2015-03-29 ENCOUNTER — Telehealth: Payer: Self-pay | Admitting: Internal Medicine

## 2015-03-29 NOTE — Telephone Encounter (Signed)
Spoke with patient and informed her that we do not have any appointments with Dr. Nicki Reaper until Wednesday. She wanted to schedule for Wednesday instead of going to a walkin. I advised her that if she gets worse to be seen at walk in or urgent care.

## 2015-03-29 NOTE — Telephone Encounter (Signed)
Pt called stating that she is still not feeling better with her sinus problems and wanted to see Dr. Nicki Reaper.  Please advise pt

## 2015-03-31 ENCOUNTER — Ambulatory Visit (INDEPENDENT_AMBULATORY_CARE_PROVIDER_SITE_OTHER): Payer: Medicare Other | Admitting: Internal Medicine

## 2015-03-31 ENCOUNTER — Encounter: Payer: Self-pay | Admitting: Internal Medicine

## 2015-03-31 ENCOUNTER — Other Ambulatory Visit: Payer: Medicare Other

## 2015-03-31 VITALS — BP 120/78 | HR 89 | Temp 98.1°F | Resp 18 | Ht 64.0 in | Wt 216.1 lb

## 2015-03-31 DIAGNOSIS — J329 Chronic sinusitis, unspecified: Secondary | ICD-10-CM

## 2015-03-31 DIAGNOSIS — R05 Cough: Secondary | ICD-10-CM

## 2015-03-31 DIAGNOSIS — Z7901 Long term (current) use of anticoagulants: Secondary | ICD-10-CM

## 2015-03-31 DIAGNOSIS — R059 Cough, unspecified: Secondary | ICD-10-CM

## 2015-03-31 DIAGNOSIS — Z5181 Encounter for therapeutic drug level monitoring: Secondary | ICD-10-CM | POA: Diagnosis not present

## 2015-03-31 LAB — PROTIME-INR
INR: 3.3 ratio — ABNORMAL HIGH (ref 0.8–1.0)
Prothrombin Time: 35.3 s — ABNORMAL HIGH (ref 9.6–13.1)

## 2015-03-31 MED ORDER — PREDNISONE 10 MG PO TABS: ORAL_TABLET | ORAL | Status: DC

## 2015-03-31 MED ORDER — CEFDINIR 300 MG PO CAPS: 300.0000 mg | ORAL_CAPSULE | Freq: Two times a day (BID) | ORAL | Status: DC

## 2015-03-31 MED ORDER — ALBUTEROL SULFATE (2.5 MG/3ML) 0.083% IN NEBU
2.5000 mg | INHALATION_SOLUTION | Freq: Once | RESPIRATORY_TRACT | Status: AC
  Administered 2015-03-31: 2.5 mg via RESPIRATORY_TRACT

## 2015-03-31 NOTE — Patient Instructions (Addendum)
Saline nasal spray - flush nose at least 2-3x/day  nasacort (or flonase) - 2 sprays each nostril one time per day.  Do this in the evening.    mucinex in the am and robitussin in the evening.    Continue flovent inhaler 2x/day.  Rinse mouth out after using.    Align (probiotic) one per day whil on antibiotics and for two weeks after complete antibiotics.

## 2015-03-31 NOTE — Progress Notes (Signed)
Pre-visit discussion using our clinic review tool. No additional management support is needed unless otherwise documented below in the visit note.  

## 2015-03-31 NOTE — Progress Notes (Signed)
[name], female   DOB: 1945/10/19, 70 y.o.   MRN: [id number]   Subjective:    [name], female    DOB: 06-21-1945, 70 y.o.   MRN: [id number]  HPI  Patient here as a work in with concerns regarding increased cough.  Reports starting approximately one week ago, she developed increased sinus pressure and drainage.  Ears stopped up.  Some wheezing.  Increased throat and chest congestion.  Increased wheezing.  Previous diarrhea x 1 day, but this has resolved.  Taking tylenol and nyquil.  No nausea or vomiting.  Eating.     Past Medical History  Diagnosis Date  . Asthma   . Allergy   . Hypercholesterolemia   . Hypertension   . Chicken pox   . History of blood clots    Past Surgical History  Procedure Laterality Date  . Blood clots  2008   Family History  Problem Relation Age of Onset  . Cancer Mother     Breast  . Heart disease Mother   . Stroke Mother   . Hypertension Mother   . Heart disease Father   . Stroke Father   . Hypertension Father   . Diabetes Father   . Colon cancer      paternal cousin   Social History   Social History  . Marital Status: Widowed    Spouse Name: N/A  . Number of Children: N/A  . Years of Education: N/A   Social History Main Topics  . Smoking status: Never Smoker   . Smokeless tobacco: Never Used  . Alcohol Use: No  . Drug Use: No  . Sexual Activity: Not Asked   Other Topics Concern  . None   Social History Narrative    Outpatient Encounter Prescriptions as of 03/31/2015  Medication Sig  . albuterol (PROVENTIL HFA) 108 (90 BASE) MCG/ACT inhaler Inhale 2 puffs into the lungs every 6 (six) hours as needed for wheezing.  . citalopram (CELEXA) 20 MG tablet TAKE ONE (1) TABLET EACH DAY  . fluticasone (FLOVENT HFA) 110 MCG/ACT inhaler Inhale 2 puffs into the lungs 2 (two) times daily.  Marland Kitchen losartan (COZAAR) 50 MG tablet TAKE ONE (1) TABLET BY MOUTH EVERY DAY  . pantoprazole (PROTONIX) 40 MG tablet TAKE ONE (1)  TABLET EACH DAY  . rosuvastatin (CRESTOR) 5 MG tablet Take 1 tablet (5 mg total) by mouth daily.  Marland Kitchen warfarin (COUMADIN) 5 MG tablet TAKE ONE (1) TABLET EACH DAY  . cefdinir (OMNICEF) 300 MG capsule Take 1 capsule (300 mg total) by mouth 2 (two) times daily.  . predniSONE (DELTASONE) 10 MG tablet Take 6 tablets x 1 day and then decrease by 1/2 tablet per day until down to zero mg.  . [EXPIRED] albuterol (PROVENTIL) (2.5 MG/3ML) 0.083% nebulizer solution 2.5 mg    No facility-administered encounter medications on file as of 03/31/2015.    Review of Systems  Constitutional: Negative for appetite change and unexpected weight change.  HENT: Positive for congestion, postnasal drip and sinus pressure.        Ear fullness.   Eyes: Negative for discharge and redness.  Respiratory: Positive for cough and wheezing.   Cardiovascular: Negative for chest pain, palpitations and leg swelling.  Gastrointestinal: Positive for diarrhea (one day of diarrhea.  this has resolved.  ). Negative for nausea and vomiting.  Genitourinary: Negative for dysuria and difficulty urinating.  Musculoskeletal: Negative for myalgias and joint swelling.  Skin: Negative for color  change and rash.  Neurological: Negative for dizziness and headaches.  Psychiatric/Behavioral: Negative for dysphoric mood and agitation.       Objective:    Physical Exam  Constitutional: She appears well-developed and well-nourished. No distress.  HENT:  Mouth/Throat: Oropharynx is clear and moist.  Minimal tenderness to palpation over the sinuses.  Nares - slightly erythematous turbinates.    Eyes: Conjunctivae are normal. Right eye exhibits no discharge. Left eye exhibits no discharge.  Neck: Neck supple.  Cardiovascular: Normal rate and regular rhythm.   Pulmonary/Chest:  Increased cough with expiration and forced expiration.  Diffuse wheezing.   Lymphadenopathy:    She has no cervical adenopathy.  Skin: No rash noted. No erythema.     BP 120/78 mmHg  Pulse 89  Temp(Src) 98.1 F (36.7 C) (Oral)  Resp 18  Ht 5\' 4"  (1.626 m)  Wt 216 lb 2 oz (98.034 kg)  BMI 37.08 kg/m2  SpO2 98%  LMP 04/29/1997 Wt Readings from Last 3 Encounters:  03/31/15 216 lb 2 oz (98.034 kg)  03/18/15 221 lb 12.8 oz (100.608 kg)  12/16/14 222 lb (100.699 kg)     Lab Results  Component Value Date   WBC 7.0 03/18/2015   HGB 14.1 03/18/2015   HCT 42.2 03/18/2015   PLT 272.0 03/18/2015   GLUCOSE 90 03/18/2015   CHOL 263* 03/18/2015   TRIG 220.0* 03/18/2015   HDL 49.10 03/18/2015   LDLDIRECT 166.0 03/18/2015   LDLCALC 120* 09/22/2014   ALT 13 03/18/2015   AST 16 03/18/2015   NA 139 03/18/2015   K 4.6 03/18/2015   CL 102 03/18/2015   CREATININE 0.95 03/18/2015   BUN 18 03/18/2015   CO2 30 03/18/2015   TSH 2.49 03/18/2015   INR 3.3* 03/31/2015       Assessment & Plan:   Problem List Items Addressed This Visit    Cough - Primary   Relevant Medications   albuterol (PROVENTIL) (2.5 MG/3ML) 0.083% nebulizer solution 2.5 mg (Completed)   Sinusitis    Sinusitis/bronchitis.  Symptoms and exam as outlined.  Nebulizer treatment given.  Increased air movement after neb.  Treat with prednisone taper as directed.  omnicef as directed.  Mucinex/robitussin as directed.  Continue inhalers and nasal spray as outlined.        Relevant Medications   cefdinir (OMNICEF) 300 MG capsule   predniSONE (DELTASONE) 10 MG tablet    Other Visit Diagnoses    Anticoagulated on Coumadin            Einar Pheasant, MD

## 2015-04-02 ENCOUNTER — Other Ambulatory Visit: Payer: Self-pay

## 2015-04-05 ENCOUNTER — Encounter: Payer: Self-pay | Admitting: Internal Medicine

## 2015-04-05 ENCOUNTER — Other Ambulatory Visit (INDEPENDENT_AMBULATORY_CARE_PROVIDER_SITE_OTHER): Payer: Medicare Other

## 2015-04-05 DIAGNOSIS — Z7901 Long term (current) use of anticoagulants: Secondary | ICD-10-CM

## 2015-04-05 DIAGNOSIS — J329 Chronic sinusitis, unspecified: Secondary | ICD-10-CM | POA: Insufficient documentation

## 2015-04-05 LAB — PROTIME-INR
INR: 3.4 ratio — AB (ref 0.8–1.0)
PROTHROMBIN TIME: 36.9 s — AB (ref 9.6–13.1)

## 2015-04-05 NOTE — Assessment & Plan Note (Signed)
Sinusitis/bronchitis.  Symptoms and exam as outlined.  Nebulizer treatment given.  Increased air movement after neb.  Treat with prednisone taper as directed.  omnicef as directed.  Mucinex/robitussin as directed.  Continue inhalers and nasal spray as outlined.

## 2015-04-08 ENCOUNTER — Encounter: Payer: Self-pay | Admitting: Emergency Medicine

## 2015-04-08 ENCOUNTER — Emergency Department: Payer: Medicare Other

## 2015-04-08 ENCOUNTER — Encounter: Payer: Self-pay | Admitting: Internal Medicine

## 2015-04-08 ENCOUNTER — Emergency Department
Admission: EM | Admit: 2015-04-08 | Discharge: 2015-04-08 | Disposition: A | Discharge status: Home / Self Care | Payer: Medicare Other | Attending: Student | Admitting: Student

## 2015-04-08 ENCOUNTER — Ambulatory Visit (INDEPENDENT_AMBULATORY_CARE_PROVIDER_SITE_OTHER): Payer: Medicare Other | Admitting: Internal Medicine

## 2015-04-08 VITALS — BP 128/78 | HR 81 | Temp 98.1°F | Wt 216.4 lb

## 2015-04-08 DIAGNOSIS — I10 Essential (primary) hypertension: Secondary | ICD-10-CM | POA: Diagnosis not present

## 2015-04-08 DIAGNOSIS — J209 Acute bronchitis, unspecified: Secondary | ICD-10-CM | POA: Diagnosis not present

## 2015-04-08 DIAGNOSIS — R062 Wheezing: Secondary | ICD-10-CM

## 2015-04-08 DIAGNOSIS — R0602 Shortness of breath: Secondary | ICD-10-CM | POA: Diagnosis not present

## 2015-04-08 DIAGNOSIS — J40 Bronchitis, not specified as acute or chronic: Secondary | ICD-10-CM

## 2015-04-08 DIAGNOSIS — R0682 Tachypnea, not elsewhere classified: Secondary | ICD-10-CM | POA: Diagnosis not present

## 2015-04-08 DIAGNOSIS — R05 Cough: Secondary | ICD-10-CM | POA: Diagnosis not present

## 2015-04-08 DIAGNOSIS — Z79899 Other long term (current) drug therapy: Secondary | ICD-10-CM | POA: Diagnosis not present

## 2015-04-08 DIAGNOSIS — Z7901 Long term (current) use of anticoagulants: Secondary | ICD-10-CM | POA: Insufficient documentation

## 2015-04-08 LAB — CBC
HCT: 43 % (ref 35.0–47.0)
Hemoglobin: 14.3 g/dL (ref 12.0–16.0)
MCH: 28.6 pg (ref 26.0–34.0)
MCHC: 33.2 g/dL (ref 32.0–36.0)
MCV: 86.1 fL (ref 80.0–100.0)
PLATELETS: 372 10*3/uL (ref 150–440)
RBC: 4.99 MIL/uL (ref 3.80–5.20)
RDW: 14.1 % (ref 11.5–14.5)
WBC: 13.1 10*3/uL — AB (ref 3.6–11.0)

## 2015-04-08 LAB — BASIC METABOLIC PANEL
ANION GAP: 8 (ref 5–15)
BUN: 22 mg/dL — ABNORMAL HIGH (ref 6–20)
CALCIUM: 8.5 mg/dL — AB (ref 8.9–10.3)
CO2: 25 mmol/L (ref 22–32)
Chloride: 106 mmol/L (ref 101–111)
Creatinine, Ser: 1.11 mg/dL — ABNORMAL HIGH (ref 0.44–1.00)
GFR calc Af Amer: 57 mL/min — ABNORMAL LOW (ref >60)
GFR, EST NON AFRICAN AMERICAN: 49 mL/min — AB (ref >60)
Glucose, Bld: 151 mg/dL — ABNORMAL HIGH (ref 65–99)
Potassium: 4.1 mmol/L (ref 3.5–5.1)
Sodium: 139 mmol/L (ref 135–145)

## 2015-04-08 LAB — RAPID INFLUENZA A&B ANTIGENS (ARMC ONLY)
INFLUENZA A (ARMC): NEGATIVE
INFLUENZA B (ARMC): NEGATIVE

## 2015-04-08 LAB — PROTIME-INR
INR: 2.48
PROTHROMBIN TIME: 26.5 s — AB (ref 11.4–15.0)

## 2015-04-08 LAB — TROPONIN I: Troponin I: <0.03 ng/mL (ref <0.031)

## 2015-04-08 MED ORDER — HYDROCODONE-ACETAMINOPHEN 5-325 MG PO TABS
1.0000 | ORAL_TABLET | Freq: Once | ORAL | Status: AC
  Administered 2015-04-08: 1 via ORAL
  Filled 2015-04-08: qty 1

## 2015-04-08 MED ORDER — IPRATROPIUM-ALBUTEROL 0.5-2.5 (3) MG/3ML IN SOLN
3.0000 mL | Freq: Once | RESPIRATORY_TRACT | Status: AC
  Administered 2015-04-08: 3 mL via RESPIRATORY_TRACT
  Filled 2015-04-08: qty 3

## 2015-04-08 MED ORDER — METHYLPREDNISOLONE SODIUM SUCC 125 MG IJ SOLR
125.0000 mg | Freq: Once | INTRAMUSCULAR | Status: AC
  Administered 2015-04-08: 125 mg via INTRAVENOUS
  Filled 2015-04-08: qty 2

## 2015-04-08 MED ORDER — SODIUM CHLORIDE 0.9 % IV BOLUS (SEPSIS)
500.0000 mL | Freq: Once | INTRAVENOUS | Status: AC
  Administered 2015-04-08: 500 mL via INTRAVENOUS

## 2015-04-08 MED ORDER — PREDNISONE 20 MG PO TABS: 60.0000 mg | ORAL_TABLET | Freq: Every day | ORAL | Status: DC

## 2015-04-08 MED ORDER — ALBUTEROL SULFATE (2.5 MG/3ML) 0.083% IN NEBU
2.5000 mg | INHALATION_SOLUTION | Freq: Once | RESPIRATORY_TRACT | Status: AC
  Administered 2015-04-08: 2.5 mg via RESPIRATORY_TRACT

## 2015-04-08 MED ORDER — BENZONATATE 100 MG PO CAPS: 100.0000 mg | ORAL_CAPSULE | Freq: Three times a day (TID) | ORAL | Status: DC | PRN

## 2015-04-08 MED ORDER — ALBUTEROL SULFATE HFA 108 (90 BASE) MCG/ACT IN AERS: 2.0000 | INHALATION_SPRAY | Freq: Four times a day (QID) | RESPIRATORY_TRACT | Status: DC | PRN

## 2015-04-08 NOTE — Progress Notes (Signed)
[name], female   DOB: 08/05/1945, 70 y.o.   MRN: [id number]   Subjective:    [name], female    DOB: July 04, 1945, 70 y.o.   MRN: [id number]  HPI  Patient with past history of hypercholesterolemia and documented history of asthma.  She comes in today as a work in with concerns regarding persistent increased cough, congestion, wheezing and sob.  She was seen on 03/31/15.  Was given a nebulizer treatment here in the office.  Was discharged on prednisone taper and omnicef.  She comes in today stating that she is no better.  Still with persistent cough and congestion.  Still wheezing.  Tired.  No vomiting.  Taking a probiotic.  Was sob walking to the exam room.  Oxygen dropped to 87%.  Pt normally not on oxygen.     Past Medical History  Diagnosis Date  . Asthma   . Allergy   . Hypercholesterolemia   . Hypertension   . Chicken pox   . History of blood clots    Past Surgical History  Procedure Laterality Date  . Blood clots  2008   Family History  Problem Relation Age of Onset  . Cancer Mother     Breast  . Heart disease Mother   . Stroke Mother   . Hypertension Mother   . Heart disease Father   . Stroke Father   . Hypertension Father   . Diabetes Father   . Colon cancer      paternal cousin   Social History   Social History  . Marital Status: Widowed    Spouse Name: N/A  . Number of Children: N/A  . Years of Education: N/A   Social History Main Topics  . Smoking status: Never Smoker   . Smokeless tobacco: Never Used  . Alcohol Use: No  . Drug Use: No  . Sexual Activity: Not Asked   Other Topics Concern  . None   Social History Narrative    Outpatient Encounter Prescriptions as of 04/08/2015  Medication Sig  . albuterol (PROVENTIL HFA) 108 (90 BASE) MCG/ACT inhaler Inhale 2 puffs into the lungs every 6 (six) hours as needed for wheezing.  . cefdinir (OMNICEF) 300 MG capsule Take 1 capsule (300 mg total) by mouth 2 (two) times daily.  .  citalopram (CELEXA) 20 MG tablet TAKE ONE (1) TABLET EACH DAY  . fluticasone (FLOVENT HFA) 110 MCG/ACT inhaler Inhale 2 puffs into the lungs 2 (two) times daily.  Marland Kitchen losartan (COZAAR) 50 MG tablet TAKE ONE (1) TABLET BY MOUTH EVERY DAY  . pantoprazole (PROTONIX) 40 MG tablet TAKE ONE (1) TABLET EACH DAY (Patient taking differently: TAKE ONE (1) TABLET EACH DAY AS NEEDED)  . rosuvastatin (CRESTOR) 5 MG tablet Take 1 tablet (5 mg total) by mouth daily.  Marland Kitchen warfarin (COUMADIN) 5 MG tablet TAKE ONE (1) TABLET EACH DAY (Patient taking differently: TAKE 0.5 TABLET DAILY)  . [DISCONTINUED] predniSONE (DELTASONE) 10 MG tablet Take 6 tablets x 1 day and then decrease by 1/2 tablet per day until down to zero mg. (Patient taking differently: Take by mouth daily. Take 6 tablets x 1 day and then decrease by 1/2 tablet per day for 12 days. starting 03/31/15 stopping on 04/11/15.)  . [EXPIRED] albuterol (PROVENTIL) (2.5 MG/3ML) 0.083% nebulizer solution 2.5 mg    No facility-administered encounter medications on file as of 04/08/2015.    Review of Systems  Constitutional: Positive for fatigue. Negative for fever.  HENT: Positive for congestion and postnasal drip.   Respiratory: Positive for cough and wheezing. Negative for chest tightness.   Cardiovascular: Negative for chest pain, palpitations and leg swelling.  Gastrointestinal: Negative for nausea, vomiting and diarrhea.  Skin: Negative for color change and rash.  Neurological: Negative for dizziness, light-headedness and headaches.  Psychiatric/Behavioral: Negative for dysphoric mood and agitation.       Objective:    Physical Exam  Constitutional: She appears well-developed and well-nourished.  Labored breathing.    HENT:  Mouth/Throat: Oropharynx is clear and moist.  Nares - slightly erythematous turbinates.    Neck: Neck supple. No thyromegaly present.  Cardiovascular: Normal rate and regular rhythm.   Pulmonary/Chest: No respiratory distress. She  has wheezes.  Increased cough and congestion.  Increased wheezing with expiration and forced expiration.  Sob and weak walking to room and getting up on table.    Abdominal: Soft. Bowel sounds are normal. There is no tenderness.  Musculoskeletal: She exhibits no edema or tenderness.  Lymphadenopathy:    She has no cervical adenopathy.  Skin: No rash noted. No erythema.    BP 128/78 mmHg  Pulse 81  Temp(Src) 98.1 F (36.7 C) (Oral)  Wt 216 lb 6 oz (98.147 kg)  SpO2 95%  LMP 04/29/1997 Wt Readings from Last 3 Encounters:  04/08/15 216 lb (97.977 kg)  04/08/15 216 lb 6 oz (98.147 kg)  03/31/15 216 lb 2 oz (98.034 kg)     Lab Results  Component Value Date   WBC 13.1* 04/08/2015   HGB 14.3 04/08/2015   HCT 43.0 04/08/2015   PLT 372 04/08/2015   GLUCOSE 151* 04/08/2015   CHOL 263* 03/18/2015   TRIG 220.0* 03/18/2015   HDL 49.10 03/18/2015   LDLDIRECT 166.0 03/18/2015   LDLCALC 120* 09/22/2014   ALT 13 03/18/2015   AST 16 03/18/2015   NA 139 04/08/2015   K 4.1 04/08/2015   CL 106 04/08/2015   CREATININE 1.11* 04/08/2015   BUN 22* 04/08/2015   CO2 25 04/08/2015   TSH 2.49 03/18/2015   INR 2.48 04/08/2015       Assessment & Plan:   Problem List Items Addressed This Visit    Acute bronchitis    Evaluated and treated 03/31/15 with prednisone and abx.  Was given neb here in the office with some noted improvement in her breathing, but still with significant wheezing and sob.  Appears to be failing outpatient therapy. Will refer to ER for cxr.  Concern pneumonia.  Also for further evaluation and treatment.  Question of need for IV abx and IV steroids and atc nebs.  Pt and son in agreement and son to transport to ER.        Essential hypertension, benign    Blood pressure under good control.  Continue same medication regimen.  Follow pressures.  Follow metabolic panel.         Other Visit Diagnoses    Wheezing    -  Primary    Relevant Medications    albuterol  (PROVENTIL) (2.5 MG/3ML) 0.083% nebulizer solution 2.5 mg (Completed)      I spent 25 minutes with the patient and more than 50% of the time was spent in consultation regarding the above.     Einar Pheasant, MD

## 2015-04-08 NOTE — Assessment & Plan Note (Signed)
Evaluated and treated 03/31/15 with prednisone and abx.  Was given neb here in the office with some noted improvement in her breathing, but still with significant wheezing and sob.  Appears to be failing outpatient therapy. Will refer to ER for cxr.  Concern pneumonia.  Also for further evaluation and treatment.  Question of need for IV abx and IV steroids and atc nebs.  Pt and son in agreement and son to transport to ER.

## 2015-04-08 NOTE — ED Provider Notes (Addendum)
[facility] Emergency Department Provider Note  ____________________________________________  Time seen: Approximately 4:15 PM  I have reviewed the triage vital signs and the nursing notes.   HISTORY  Chief Complaint Shortness of Breath    HPI Tina Duarte is a 70 y.o. female history of hyperlipidemia, hypertension, factor V Leiden with history of blood clots in the legs and arms on Coumadin who presents for evaluation of approximately 10 days cough, wheezing, shortness of breath, gradual onset, constant since onset, currently moderate to severe, improved minimally with breathing treatments. The patient was treated with abx and steroids last week however returned to her primary care doctor's office today with continued wheezing. She received a nebulizer treatments after which her ambulatory oxygenation saturation was 87% she was sent to the emergency department for further evaluation. The patient reports her primary complaint is the wheezing and cough. She denies chest pain. No fevers. No vomiting, diarrhea, abdominal pain or chills. No dysuria.   Past Medical History  Diagnosis Date  . Asthma   . Allergy   . Hypercholesterolemia   . Hypertension   . Chicken pox   . History of blood clots     Patient Active Problem List   Diagnosis Date Noted  . Sinusitis 04/05/2015  . Sore throat 10/17/2014  . Health care maintenance 09/20/2014  . Acute bronchitis 06/09/2014  . LOC (loss of consciousness) 06/09/2014  . Heel pain 05/31/2014  . Long term current use of anticoagulant therapy 04/04/2014  . Leg pain 02/16/2014  . Back pain 12/28/2013  . Bruise 11/22/2013  . Stress 09/07/2013  . Tongue pain 07/26/2013  . Cough 04/16/2013  . Abdominal wall hematoma 03/09/2013  . Left hip pain 12/05/2012  . 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 Rib pain 08/28/2012  . Essential hypertension, benign 05/22/2012  . Hypercholesterolemia 05/22/2012  . Environmental allergies  05/22/2012  . Asthma 05/22/2012  . History of blood clots 05/22/2012  . Diarrhea 05/22/2012    Past Surgical History  Procedure Laterality Date  . Blood clots  2008    Current Outpatient Rx  Name  Route  Sig  Dispense  Refill  . acetaminophen (TYLENOL) 650 MG CR tablet   Oral   Take 650 mg by mouth every 8 (eight) hours as needed for pain.         Marland Kitchen albuterol (PROVENTIL HFA) 108 (90 BASE) MCG/ACT inhaler   Inhalation   Inhale 2 puffs into the lungs every 6 (six) hours as needed for wheezing.   1 Inhaler   1   . cefdinir (OMNICEF) 300 MG capsule   Oral   Take 1 capsule (300 mg total) by mouth 2 (two) times daily.   20 capsule   0   . citalopram (CELEXA) 20 MG tablet      TAKE ONE (1) TABLET EACH DAY   30 tablet   3   . diphenhydrAMINE (BENADRYL) 25 mg capsule   Oral   Take 25 mg by mouth every 6 (six) hours as needed.         . fluticasone (FLOVENT HFA) 110 MCG/ACT inhaler   Inhalation   Inhale 2 puffs into the lungs 2 (two) times daily.   1 Inhaler   1   . losartan (COZAAR) 50 MG tablet      TAKE ONE (1) TABLET BY MOUTH EVERY DAY   90 tablet   1   . pantoprazole (PROTONIX) 40 MG tablet      TAKE  ONE (1) TABLET EACH DAY Patient taking differently: TAKE ONE (1) TABLET EACH DAY AS NEEDED   30 tablet   6   . rosuvastatin (CRESTOR) 5 MG tablet   Oral   Take 1 tablet (5 mg total) by mouth daily.   30 tablet   2   . warfarin (COUMADIN) 5 MG tablet      TAKE ONE (1) TABLET EACH DAY Patient taking differently: TAKE 0.5 TABLET DAILY   30 tablet   11   . albuterol (PROVENTIL HFA;VENTOLIN HFA) 108 (90 Base) MCG/ACT inhaler   Inhalation   Inhale 2 puffs into the lungs every 6 (six) hours as needed for wheezing or shortness of breath.   1 Inhaler   0   . benzonatate (TESSALON PERLES) 100 MG capsule   Oral   Take 1 capsule (100 mg total) by mouth 3 (three) times daily as needed for cough.   12 capsule   0   . predniSONE (DELTASONE) 20 MG  tablet   Oral   Take 3 tablets (60 mg total) by mouth daily. Starting tomorrow, Take 3 tabs by mouth daily for 3 days. On day 4 and 5, take 2 tablets by mouth daily. On day 6 and 7 take 1 tablet by mouth daily.   15 tablet   0     Allergies Review of patient's allergies indicates no known allergies.  Family History  Problem Relation Age of Onset  . Cancer Mother     Breast  . Heart disease Mother   . Stroke Mother   . Hypertension Mother   . Heart disease Father   . Stroke Father   . Hypertension Father   . Diabetes Father   . Colon cancer      paternal cousin    Social History Social History  Substance Use Topics  . Smoking status: Never Smoker   . Smokeless tobacco: Never Used  . Alcohol Use: No    Review of Systems Constitutional: No fever/chills Eyes: No visual changes. ENT: No sore throat. Cardiovascular: Denies chest pain. Respiratory: + shortness of breath. Gastrointestinal: No abdominal pain.  No nausea, no vomiting.  No diarrhea.  No constipation. Genitourinary: Negative for dysuria. Musculoskeletal: Negative for back pain. Skin: Negative for rash. Neurological: Negative for headaches, focal weakness or numbness.  10-point ROS otherwise negative.  ____________________________________________   PHYSICAL EXAM:  VITAL SIGNS: ED Triage Vitals  Enc Vitals Group     BP 04/08/15 1513 132/66 mmHg     Pulse Rate 04/08/15 1513 93     Resp 04/08/15 1513 28     Temp 04/08/15 1513 98.1 F (36.7 C)     Temp Source 04/08/15 1513 Oral     SpO2 04/08/15 1513 91 %     Weight 04/08/15 1513 216 lb (97.977 kg)     Height 04/08/15 1513 5\' 4"  (1.626 m)     Head Cir --      Peak Flow --      Pain Score --      Pain Loc --      Pain Edu? --      Excl. in [place]? --     Constitutional: Alert and oriented. Nontoxic appearing. Frequent dry cough. Mild tachypnea. Eyes: Conjunctivae are normal. PERRL. EOMI. Head: Atraumatic. Nose: No  congestion/rhinnorhea. Mouth/Throat: Mucous membranes are moist.  Oropharynx non-erythematous. Neck: No stridor.  Supple without meningismus. Cardiovascular: Normal rate, regular rhythm. Grossly normal heart sounds.  Good peripheral circulation. Respiratory: Mild  tachypnea. Diffuse expiratory wheeze with moderate air movement. Gastrointestinal: Soft and nontender. No distention.  No CVA tenderness. Genitourinary: deferred Musculoskeletal: No lower extremity tenderness nor edema. No calf swelling/tenderness, asymmetry. No joint effusions. Neurologic:  Normal speech and language. No gross focal neurologic deficits are appreciated.  Skin:  Skin is warm, dry and intact. No rash noted. Psychiatric: Mood and affect are normal. Speech and behavior are normal.  ____________________________________________   LABS (all labs ordered are listed, but only abnormal results are displayed)  Labs Reviewed  BASIC METABOLIC PANEL - Abnormal; Notable for the following:    Glucose, Bld 151 (*)    BUN 22 (*)    Creatinine, Ser 1.11 (*)    Calcium 8.5 (*)    GFR calc non Af Amer 49 (*)    GFR calc Af Amer 57 (*)    All other components within normal limits  CBC - Abnormal; Notable for the following:    WBC 13.1 (*)    All other components within normal limits  PROTIME-INR - Abnormal; Notable for the following:    Prothrombin Time 26.5 (*)    All other components within normal limits  RAPID INFLUENZA A&B ANTIGENS ([facility] ONLY)  TROPONIN I   ____________________________________________  EKG  ED ECG REPORT I, Joanne Gavel, the attending physician, personally viewed and interpreted this ECG.   Date: 04/08/2015  EKG Time: 15:13  Rate: 89  Rhythm: normal sinus rhythm  Axis: normal  Intervals:none  ST&T Change: No acute ST elevation. Q waves in lead 3 and aVF. Nonspecific T wave abnormality in the anteroseptal  leads.  ____________________________________________  RADIOLOGY  CXR IMPRESSION: Chronic bronchitic changes without acute infiltrate. ____________________________________________   PROCEDURES  Procedure(s) performed: None  Critical Care performed: No  ____________________________________________   INITIAL IMPRESSION / ASSESSMENT AND PLAN / ED COURSE  Pertinent labs & imaging results that were available during my care of the patient were reviewed by me and considered in my medical decision making (see chart for details).  Tina Duarte is a 70 y.o. female history of hyperlipidemia, hypertension, factor V Leiden with history of blood clots in the legs and arms on Coumadin who presents for evaluation of approximately 10 days cough, wheezing, shortness of breath. On exam she is nontoxic appearing but mildly tachypneic. Vital signs are stable, she is afebrile. She has no oxygen requirement on arrival to the emergency department. She does have frequent cough and obvious wheezing. Suspect her symptoms are secondary to bronchitis. Chest x-ray shows chronic bronchitic changes without acute infiltrate. We'll treat with DuoNeb's, steroids and reassess for disposition. Labs reviewed. Creatinine is mildly elevated at 1.11 but this is close to the patient's baseline. Troponin negative. White blood cell count is also slightly elevated at 13,000 which can be seen in the setting of viral illness and the patient has recently received steroids. INR is appropriately elevated at 2.48 and given her obvious wheeze and appropriate anticoagulation, I doubt that her symptoms today represent pulmonary embolism.  ----------------------------------------- 6:37 PM on 04/08/2015 -----------------------------------------  Patient is resting comfortably in bed. She is no longer tachypneic. Her wheeze has resolved and she has great air movement. She reports that she feels better after the above treatments. We'll obtain  an ambulatory O2 sat and if within normal limits, we'll anticipate discharge with symptomatic treatment for bronchitis.  ----------------------------------------- 7:41 PM on 04/08/2015 -----------------------------------------  Ambulatory O2 sat 96%. The patient is not requiring oxygen. We discussed the meticulous return precautions, need for close  follow-up and she is comfortable with the discharge plan. DC home with prednisone taper, albuterol, Tessalon perles. ____________________________________________   FINAL CLINICAL IMPRESSION(S) / ED DIAGNOSES  Final diagnoses:  Bronchitis      Joanne Gavel, MD 04/08/15 1942  Joanne Gavel, MD 04/08/15 [id number]

## 2015-04-08 NOTE — ED Notes (Addendum)
Pt sent here from [facility] for cough/congestion x1 week; pt with dry, barky cough. Pt with shortness of breath after speaking in sentences, feels as though she's "unable to get enough air". Pt placed on 2L via [place], hx of blood clots.

## 2015-04-08 NOTE — Progress Notes (Signed)
Pre visit review using our clinic review tool, if applicable. No additional management support is needed unless otherwise documented below in the visit note. 

## 2015-04-09 ENCOUNTER — Other Ambulatory Visit: Payer: Self-pay

## 2015-04-11 ENCOUNTER — Encounter: Payer: Self-pay | Admitting: Internal Medicine

## 2015-04-11 NOTE — Assessment & Plan Note (Signed)
Blood pressure under good control.  Continue same medication regimen.  Follow pressures.  Follow metabolic panel.   

## 2015-04-15 ENCOUNTER — Telehealth: Payer: Self-pay | Admitting: *Deleted

## 2015-04-15 ENCOUNTER — Other Ambulatory Visit: Payer: Self-pay

## 2015-04-15 NOTE — Telephone Encounter (Signed)
Pt is questioning does she need to get labs for INR done. Please advise, thanks

## 2015-04-15 NOTE — Telephone Encounter (Signed)
[facility]: Patient has requested lab results from 04/08/15. Pt Contact [phone]

## 2015-04-16 ENCOUNTER — Other Ambulatory Visit: Payer: Self-pay | Admitting: Internal Medicine

## 2015-04-16 ENCOUNTER — Other Ambulatory Visit: Payer: Self-pay

## 2015-04-16 DIAGNOSIS — R7989 Other specified abnormal findings of blood chemistry: Secondary | ICD-10-CM

## 2015-04-16 DIAGNOSIS — Z7901 Long term (current) use of anticoagulants: Secondary | ICD-10-CM

## 2015-04-16 DIAGNOSIS — D72829 Elevated white blood cell count, unspecified: Secondary | ICD-10-CM

## 2015-04-16 NOTE — Telephone Encounter (Signed)
Error

## 2015-04-16 NOTE — Telephone Encounter (Signed)
Notify pt that her INR in ER ok.  Would recommend recheck Monday 04/19/15.  Please schedule her an appt.

## 2015-04-16 NOTE — Progress Notes (Signed)
Orders placed for f/u labs.  

## 2015-04-16 NOTE — Telephone Encounter (Signed)
Notified pt of Dr. Bary Leriche comments, pt has been scheduled Monday for INR check

## 2015-04-19 ENCOUNTER — Other Ambulatory Visit (INDEPENDENT_AMBULATORY_CARE_PROVIDER_SITE_OTHER): Payer: Medicare Other | Admitting: *Deleted

## 2015-04-19 ENCOUNTER — Other Ambulatory Visit (INDEPENDENT_AMBULATORY_CARE_PROVIDER_SITE_OTHER): Payer: Medicare Other

## 2015-04-19 DIAGNOSIS — R748 Abnormal levels of other serum enzymes: Secondary | ICD-10-CM | POA: Diagnosis not present

## 2015-04-19 DIAGNOSIS — Z7901 Long term (current) use of anticoagulants: Secondary | ICD-10-CM

## 2015-04-19 DIAGNOSIS — Z5181 Encounter for therapeutic drug level monitoring: Secondary | ICD-10-CM | POA: Diagnosis not present

## 2015-04-19 DIAGNOSIS — D72829 Elevated white blood cell count, unspecified: Secondary | ICD-10-CM

## 2015-04-19 DIAGNOSIS — R7989 Other specified abnormal findings of blood chemistry: Secondary | ICD-10-CM

## 2015-04-19 LAB — CBC WITH DIFFERENTIAL/PLATELET
BASOS ABS: 0.1 10*3/uL (ref 0.0–0.1)
Basophils Relative: 0.7 % (ref 0.0–3.0)
EOS ABS: 0.3 10*3/uL (ref 0.0–0.7)
Eosinophils Relative: 2.9 % (ref 0.0–5.0)
HCT: 43.2 % (ref 36.0–46.0)
Hemoglobin: 14.4 g/dL (ref 12.0–15.0)
LYMPHS ABS: 4.3 10*3/uL — AB (ref 0.7–4.0)
LYMPHS PCT: 40.7 % (ref 12.0–46.0)
MCHC: 33.3 g/dL (ref 30.0–36.0)
MCV: 87 fl (ref 78.0–100.0)
Monocytes Absolute: 0.6 10*3/uL (ref 0.1–1.0)
Monocytes Relative: 5.2 % (ref 3.0–12.0)
NEUTROS ABS: 5.3 10*3/uL (ref 1.4–7.7)
NEUTROS PCT: 50.5 % (ref 43.0–77.0)
PLATELETS: 317 10*3/uL (ref 150.0–400.0)
RBC: 4.96 Mil/uL (ref 3.87–5.11)
RDW: 14.8 % (ref 11.5–15.5)
WBC: 10.6 10*3/uL — ABNORMAL HIGH (ref 4.0–10.5)

## 2015-04-19 LAB — PROTIME-INR
INR: 1.6 ratio — ABNORMAL HIGH (ref 0.8–1.0)
PROTHROMBIN TIME: 17.5 s — AB (ref 9.6–13.1)

## 2015-04-19 LAB — BASIC METABOLIC PANEL
BUN: 13 mg/dL (ref 6–23)
CALCIUM: 9.3 mg/dL (ref 8.4–10.5)
CO2: 30 mEq/L (ref 19–32)
Chloride: 99 mEq/L (ref 96–112)
Creatinine, Ser: 0.96 mg/dL (ref 0.40–1.20)
GFR: 60.8 mL/min (ref >60.00)
Glucose, Bld: 88 mg/dL (ref 70–99)
Potassium: 4.7 mEq/L (ref 3.5–5.1)
SODIUM: 136 meq/L (ref 135–145)

## 2015-04-19 NOTE — Progress Notes (Signed)
Do I need to do something with this?  Just let me know.  Thanks

## 2015-04-20 ENCOUNTER — Encounter: Payer: Self-pay | Admitting: *Deleted

## 2015-04-20 ENCOUNTER — Encounter: Payer: Self-pay | Admitting: Internal Medicine

## 2015-04-20 ENCOUNTER — Other Ambulatory Visit: Payer: Self-pay | Admitting: Internal Medicine

## 2015-04-30 ENCOUNTER — Encounter: Payer: Self-pay | Admitting: *Deleted

## 2015-04-30 ENCOUNTER — Other Ambulatory Visit (INDEPENDENT_AMBULATORY_CARE_PROVIDER_SITE_OTHER): Payer: Medicare Other

## 2015-04-30 DIAGNOSIS — Z7901 Long term (current) use of anticoagulants: Secondary | ICD-10-CM

## 2015-04-30 LAB — PROTIME-INR
INR: 3.1 ratio — AB (ref 0.8–1.0)
PROTHROMBIN TIME: 33.3 s — AB (ref 9.6–13.1)

## 2015-05-03 ENCOUNTER — Other Ambulatory Visit: Payer: Self-pay

## 2015-05-04 ENCOUNTER — Telehealth: Payer: Self-pay | Admitting: *Deleted

## 2015-05-04 ENCOUNTER — Other Ambulatory Visit: Payer: Self-pay

## 2015-05-04 ENCOUNTER — Other Ambulatory Visit (INDEPENDENT_AMBULATORY_CARE_PROVIDER_SITE_OTHER): Payer: Medicare Other

## 2015-05-04 DIAGNOSIS — Z5181 Encounter for therapeutic drug level monitoring: Secondary | ICD-10-CM

## 2015-05-04 DIAGNOSIS — Z7901 Long term (current) use of anticoagulants: Secondary | ICD-10-CM

## 2015-05-04 DIAGNOSIS — E7800 Pure hypercholesterolemia, unspecified: Secondary | ICD-10-CM | POA: Diagnosis not present

## 2015-05-04 LAB — HEPATIC FUNCTION PANEL
ALBUMIN: 4.1 g/dL (ref 3.5–5.2)
ALK PHOS: 62 U/L (ref 39–117)
ALT: 15 U/L (ref 0–35)
AST: 21 U/L (ref 0–37)
Bilirubin, Direct: 0.2 mg/dL (ref 0.0–0.3)
TOTAL PROTEIN: 7.4 g/dL (ref 6.0–8.3)
Total Bilirubin: 1.2 mg/dL (ref 0.2–1.2)

## 2015-05-04 LAB — PROTIME-INR
INR: 2.1 ratio — AB (ref 0.8–1.0)
Prothrombin Time: 22 s — ABNORMAL HIGH (ref 9.6–13.1)

## 2015-05-04 NOTE — Telephone Encounter (Signed)
Labs and dx?  

## 2015-05-04 NOTE — Telephone Encounter (Signed)
This message was sent to me, but it appears that pt did not keep lab appt for Monday.  Needs to come in asap.

## 2015-05-04 NOTE — Telephone Encounter (Signed)
Duplicate.  Order placed for labs.

## 2015-05-04 NOTE — Telephone Encounter (Signed)
Order placed for labs.

## 2015-05-05 ENCOUNTER — Encounter: Payer: Self-pay | Admitting: *Deleted

## 2015-05-05 ENCOUNTER — Telehealth: Payer: Self-pay | Admitting: *Deleted

## 2015-05-05 ENCOUNTER — Other Ambulatory Visit: Payer: Self-pay | Admitting: *Deleted

## 2015-05-05 MED ORDER — NYSTATIN 100000 UNIT/ML MT SUSP: 5.0000 mL | Freq: Three times a day (TID) | OROMUCOSAL | Status: DC

## 2015-05-05 NOTE — Telephone Encounter (Signed)
Confirm nkda.  If no, then can try nystatin suspension - 5 cc's swish and spit tid.  One bottle with no refills.  Let us know if persistent problems.

## 2015-05-05 NOTE — Telephone Encounter (Signed)
Sent Rx to pharmacy & notified pt via mychart

## 2015-05-05 NOTE — Telephone Encounter (Signed)
Pt came in for labs yesterday & stopped me in the hallway. She mentioned that her tongue has been burning since her ER visit a few weeks ago. Pt states that a nurse gave her a nebulizer treatment & put 4 vials of medication in one treatment. Her tongue has burning every since.

## 2015-05-05 NOTE — Telephone Encounter (Signed)
Rx sent to pharmacy   

## 2015-05-11 ENCOUNTER — Other Ambulatory Visit: Payer: Self-pay

## 2015-05-13 ENCOUNTER — Other Ambulatory Visit (INDEPENDENT_AMBULATORY_CARE_PROVIDER_SITE_OTHER): Payer: Medicare Other

## 2015-05-13 DIAGNOSIS — Z7901 Long term (current) use of anticoagulants: Secondary | ICD-10-CM

## 2015-05-13 LAB — PROTIME-INR
INR: 2.5 ratio — ABNORMAL HIGH (ref 0.8–1.0)
PROTHROMBIN TIME: 26.8 s — AB (ref 9.6–13.1)

## 2015-05-20 ENCOUNTER — Other Ambulatory Visit: Payer: Self-pay | Admitting: Internal Medicine

## 2015-05-27 ENCOUNTER — Other Ambulatory Visit (INDEPENDENT_AMBULATORY_CARE_PROVIDER_SITE_OTHER): Payer: Medicare Other

## 2015-05-27 DIAGNOSIS — Z7901 Long term (current) use of anticoagulants: Secondary | ICD-10-CM | POA: Diagnosis not present

## 2015-05-27 LAB — PROTIME-INR
INR: 2 ratio — ABNORMAL HIGH (ref 0.8–1.0)
PROTHROMBIN TIME: 21.5 s — AB (ref 9.6–13.1)

## 2015-05-31 ENCOUNTER — Encounter: Payer: Self-pay | Admitting: *Deleted

## 2015-06-14 ENCOUNTER — Other Ambulatory Visit: Payer: Self-pay

## 2015-06-15 ENCOUNTER — Ambulatory Visit (INDEPENDENT_AMBULATORY_CARE_PROVIDER_SITE_OTHER): Payer: Medicare Other | Admitting: Internal Medicine

## 2015-06-15 ENCOUNTER — Encounter: Payer: Self-pay | Admitting: Internal Medicine

## 2015-06-15 VITALS — BP 110/70 | HR 77 | Temp 98.6°F | Resp 18 | Ht 64.0 in | Wt 218.2 lb

## 2015-06-15 DIAGNOSIS — R05 Cough: Secondary | ICD-10-CM

## 2015-06-15 DIAGNOSIS — F439 Reaction to severe stress, unspecified: Secondary | ICD-10-CM

## 2015-06-15 DIAGNOSIS — Z7901 Long term (current) use of anticoagulants: Secondary | ICD-10-CM | POA: Diagnosis not present

## 2015-06-15 DIAGNOSIS — I10 Essential (primary) hypertension: Secondary | ICD-10-CM | POA: Diagnosis not present

## 2015-06-15 DIAGNOSIS — E7800 Pure hypercholesterolemia, unspecified: Secondary | ICD-10-CM | POA: Diagnosis not present

## 2015-06-15 DIAGNOSIS — R059 Cough, unspecified: Secondary | ICD-10-CM

## 2015-06-15 DIAGNOSIS — Z5181 Encounter for therapeutic drug level monitoring: Secondary | ICD-10-CM | POA: Diagnosis not present

## 2015-06-15 DIAGNOSIS — Z86718 Personal history of other venous thrombosis and embolism: Secondary | ICD-10-CM

## 2015-06-15 DIAGNOSIS — J4520 Mild intermittent asthma, uncomplicated: Secondary | ICD-10-CM

## 2015-06-15 DIAGNOSIS — Z658 Other specified problems related to psychosocial circumstances: Secondary | ICD-10-CM

## 2015-06-15 DIAGNOSIS — M79606 Pain in leg, unspecified: Secondary | ICD-10-CM

## 2015-06-15 LAB — PROTIME-INR
INR: 2.4 ratio — AB (ref 0.8–1.0)
PROTHROMBIN TIME: 26.2 s — AB (ref 9.6–13.1)

## 2015-06-15 MED ORDER — CLOTRIMAZOLE 10 MG MT TROC: 10.0000 mg | Freq: Three times a day (TID) | OROMUCOSAL | Status: DC

## 2015-06-15 MED ORDER — PREDNISONE 10 MG PO TABS
ORAL_TABLET | ORAL | Status: DC
Start: 2015-06-15 — End: 2015-10-18

## 2015-06-15 NOTE — Progress Notes (Signed)
Pre-visit discussion using our clinic review tool. No additional management support is needed unless otherwise documented below in the visit note.  

## 2015-06-15 NOTE — Progress Notes (Signed)
[name], female   DOB: Jan 17, 1946, 70 y.o.   MRN: [id number]   Subjective:    [name], female    DOB: [birth year], 70 y.o.   MRN: [id number]  HPI  Patient here for a scheduled follow up.  She reports she is doing relatively well.  Helping to keep her grandchildren.  They stay with her everyday.  Some increased stress related to this.  She reports her breathing had been better.  Recently developed some increased cough.  Non productive.  No chest tightness.  No chest pain.  No abdominal pain or cramping.  Bowels stable.  Increased leg pain.  Request referral to ortho.    Past Medical History  Diagnosis Date  . Asthma   . Allergy   . Hypercholesterolemia   . Hypertension   . Chicken pox   . History of blood clots    Past Surgical History  Procedure Laterality Date  . Blood clots  2008   Family History  Problem Relation Age of Onset  . Cancer Mother     Breast  . Heart disease Mother   . Stroke Mother   . Hypertension Mother   . Heart disease Father   . Stroke Father   . Hypertension Father   . Diabetes Father   . Colon cancer      paternal cousin   Social History   Social History  . Marital Status: Widowed    Spouse Name: N/A  . Number of Children: N/A  . Years of Education: N/A   Social History Main Topics  . Smoking status: Never Smoker   . Smokeless tobacco: Never Used  . Alcohol Use: No  . Drug Use: No  . Sexual Activity: Not Asked   Other Topics Concern  . None   Social History Narrative    Outpatient Encounter Prescriptions as of 06/15/2015  Medication Sig  . acetaminophen (TYLENOL) 650 MG CR tablet Take 650 mg by mouth every 8 (eight) hours as needed for pain.  Marland Kitchen albuterol (PROVENTIL HFA) 108 (90 BASE) MCG/ACT inhaler Inhale 2 puffs into the lungs every 6 (six) hours as needed for wheezing.  Marland Kitchen albuterol (PROVENTIL HFA;VENTOLIN HFA) 108 (90 Base) MCG/ACT inhaler Inhale 2 puffs into the lungs every 6 (six) hours as needed for  wheezing or shortness of breath.  . citalopram (CELEXA) 20 MG tablet TAKE ONE (1) TABLET EACH DAY  . diphenhydrAMINE (BENADRYL) 25 mg capsule Take 25 mg by mouth every 6 (six) hours as needed.  . fluticasone (FLOVENT HFA) 110 MCG/ACT inhaler Inhale 2 puffs into the lungs 2 (two) times daily.  Marland Kitchen losartan (COZAAR) 50 MG tablet TAKE ONE (1) TABLET BY MOUTH EVERY DAY  . nystatin (MYCOSTATIN) 100000 UNIT/ML suspension Take 5 mLs (500,000 Units total) by mouth 3 (three) times daily.  . pantoprazole (PROTONIX) 40 MG tablet TAKE ONE (1) TABLET EACH DAY (Patient taking differently: TAKE ONE (1) TABLET EACH DAY AS NEEDED)  . rosuvastatin (CRESTOR) 5 MG tablet TAKE ONE (1) TABLET EACH DAY  . warfarin (COUMADIN) 5 MG tablet TAKE ONE (1) TABLET EACH DAY (Patient taking differently: TAKE 0.5 TABLET DAILY)  . clotrimazole (MYCELEX) 10 MG troche Take 1 tablet (10 mg total) by mouth 3 (three) times daily.  . predniSONE (DELTASONE) 10 MG tablet Take 6 tablets x 1 day and then decrease by 1/2 tablet per day until down to zero mg.  . [DISCONTINUED] benzonatate (TESSALON PERLES) 100 MG capsule Take 1  capsule (100 mg total) by mouth 3 (three) times daily as needed for cough.  . [DISCONTINUED] cefdinir (OMNICEF) 300 MG capsule Take 1 capsule (300 mg total) by mouth 2 (two) times daily.  . [DISCONTINUED] predniSONE (DELTASONE) 20 MG tablet Take 3 tablets (60 mg total) by mouth daily. Take 3 tablets by mouth daily for 3 days. On day 4 and 5, take 2 tablets by mouth daily. On day 6 and 7, take one tablet by mouth daily. On day 8 and 9 take half a tablet by mouth daily.   No facility-administered encounter medications on file as of 06/15/2015.    Review of Systems  Constitutional: Negative for appetite change and unexpected weight change.  HENT: Negative for congestion and sinus pressure.   Respiratory: Positive for cough. Negative for chest tightness and shortness of breath.   Cardiovascular: Negative for chest pain,  palpitations and leg swelling.  Gastrointestinal: Negative for nausea, vomiting, abdominal pain and diarrhea.  Genitourinary: Negative for dysuria and difficulty urinating.  Musculoskeletal: Negative for back pain.       Leg pain  Skin: Negative for color change and rash.  Neurological: Negative for dizziness, light-headedness and headaches.  Psychiatric/Behavioral: Negative for dysphoric mood and agitation.       Objective:    Physical Exam  Constitutional: She appears well-developed and well-nourished. No distress.  HENT:  Nose: Nose normal.  Mouth/Throat: Oropharynx is clear and moist.  Neck: Neck supple. No thyromegaly present.  Cardiovascular: Normal rate and regular rhythm.   Pulmonary/Chest: Breath sounds normal. No respiratory distress.  Increased cough with forced expiration.    Abdominal: Soft. Bowel sounds are normal. There is no tenderness.  Musculoskeletal: She exhibits no edema or tenderness.  Lymphadenopathy:    She has no cervical adenopathy.  Skin: No rash noted. No erythema.  Psychiatric: She has a normal mood and affect. Her behavior is normal.    BP 110/70 mmHg  Pulse 77  Temp(Src) 98.6 F (37 C) (Oral)  Resp 18  Ht 5\' 4"  (1.626 m)  Wt 218 lb 4 oz (98.998 kg)  BMI 37.44 kg/m2  SpO2 93%  LMP 04/29/1997 Wt Readings from Last 3 Encounters:  06/15/15 218 lb 4 oz (98.998 kg)  04/08/15 216 lb (97.977 kg)  04/08/15 216 lb 6 oz (98.147 kg)     Lab Results  Component Value Date   WBC 10.6* 04/19/2015   HGB 14.4 04/19/2015   HCT 43.2 04/19/2015   PLT 317.0 04/19/2015   GLUCOSE 88 04/19/2015   CHOL 263* 03/18/2015   TRIG 220.0* 03/18/2015   HDL 49.10 03/18/2015   LDLDIRECT 166.0 03/18/2015   LDLCALC 120* 09/22/2014   ALT 15 05/04/2015   AST 21 05/04/2015   NA 136 04/19/2015   K 4.7 04/19/2015   CL 99 04/19/2015   CREATININE 0.96 04/19/2015   BUN 13 04/19/2015   CO2 30 04/19/2015   TSH 2.49 03/18/2015   INR 2.4* 06/15/2015    Dg Chest 2  View  04/08/2015  CLINICAL DATA:  Cough and congestion for 1 week, dry barking cough, shortness of breath after speaking in sentences, feels as if she is unable to get enough air, history blood clots, asthma, hypertension EXAM: CHEST  2 VIEW COMPARISON:  06/08/2014 FINDINGS: Enlargement of cardiac silhouette. Tortuous aorta. Mediastinal contours and pulmonary vascularity normal. Mild chronic peribronchial thickening. No acute infiltrate, pleural effusion or pneumothorax. Bones demineralized. IMPRESSION: Chronic bronchitic changes without acute infiltrate. Electronically Signed   By: Lavonia Dana  M.D.   On: 04/08/2015 16:08       Assessment & Plan:   Problem List Items Addressed This Visit    Asthma    With some increased cough as outlined.  Do not feel abx warranted.  Discussed inhalers and nasal sprays.  Prednisone taper as directed.  Follow.  Notify me if symptoms do not resolve.        Relevant Medications   predniSONE (DELTASONE) 10 MG tablet   Cough    Cough as outlined.  Treat with prednisone taper as outlined.  Follow.       Essential hypertension, benign    Blood pressure under good control.  Continue same medication regimen.  Follow pressures.  Follow metabolic panel.        History of blood clots    On coumadin.  Check pt/inr.       Hypercholesterolemia    On crestor.  Low cholesterol diet and exercise.  Follow lipid panel and liver function tests.        Leg pain    Persistent leg/knee pain.  Has been evaluated at pain clinic.  Refer to ortho at pts request.        Relevant Orders   Ambulatory referral to Orthopedic Surgery   Stress    On citalopram.  Stable.  Follow.        Other Visit Diagnoses    Anticoagulated on Coumadin    -  Primary    Relevant Orders    Protime-INR (Completed)        Einar Pheasant, MD

## 2015-06-16 ENCOUNTER — Encounter: Payer: Self-pay | Admitting: *Deleted

## 2015-06-27 ENCOUNTER — Encounter: Payer: Self-pay | Admitting: Internal Medicine

## 2015-06-27 NOTE — Assessment & Plan Note (Signed)
On coumadin.  Check pt/inr.  ?

## 2015-06-27 NOTE — Assessment & Plan Note (Signed)
Blood pressure under good control.  Continue same medication regimen.  Follow pressures.  Follow metabolic panel.   

## 2015-06-27 NOTE — Assessment & Plan Note (Signed)
On citalopram.  Stable.  Follow.   

## 2015-06-27 NOTE — Assessment & Plan Note (Signed)
Cough as outlined.  Treat with prednisone taper as outlined.  Follow.

## 2015-06-27 NOTE — Assessment & Plan Note (Signed)
On crestor.  Low cholesterol diet and exercise.  Follow lipid panel and liver function tests.   

## 2015-06-27 NOTE — Assessment & Plan Note (Signed)
With some increased cough as outlined.  Do not feel abx warranted.  Discussed inhalers and nasal sprays.  Prednisone taper as directed.  Follow.  Notify me if symptoms do not resolve.

## 2015-06-27 NOTE — Assessment & Plan Note (Signed)
Persistent leg/knee pain.  Has been evaluated at pain clinic.  Refer to ortho at pts request.

## 2015-07-07 ENCOUNTER — Other Ambulatory Visit: Payer: Self-pay

## 2015-08-06 ENCOUNTER — Other Ambulatory Visit: Payer: Self-pay | Admitting: Internal Medicine

## 2015-08-09 ENCOUNTER — Other Ambulatory Visit: Payer: Self-pay

## 2015-08-11 ENCOUNTER — Other Ambulatory Visit (INDEPENDENT_AMBULATORY_CARE_PROVIDER_SITE_OTHER): Payer: Medicare Other

## 2015-08-11 DIAGNOSIS — Z7901 Long term (current) use of anticoagulants: Secondary | ICD-10-CM | POA: Diagnosis not present

## 2015-08-11 LAB — PROTIME-INR
INR: 2.8 ratio — ABNORMAL HIGH (ref 0.8–1.0)
Prothrombin Time: 29.8 s — ABNORMAL HIGH (ref 9.6–13.1)

## 2015-08-12 ENCOUNTER — Telehealth: Payer: Self-pay | Admitting: *Deleted

## 2015-08-12 DIAGNOSIS — Z7901 Long term (current) use of anticoagulants: Secondary | ICD-10-CM

## 2015-08-12 NOTE — Telephone Encounter (Signed)
PT/INR future order placed

## 2015-08-23 ENCOUNTER — Other Ambulatory Visit (INDEPENDENT_AMBULATORY_CARE_PROVIDER_SITE_OTHER): Payer: Medicare Other

## 2015-08-23 DIAGNOSIS — Z7901 Long term (current) use of anticoagulants: Secondary | ICD-10-CM

## 2015-08-23 LAB — PROTIME-INR
INR: 2.1 ratio — ABNORMAL HIGH (ref 0.8–1.0)
PROTHROMBIN TIME: 23 s — AB (ref 9.6–13.1)

## 2015-08-26 ENCOUNTER — Other Ambulatory Visit: Payer: Self-pay | Admitting: Internal Medicine

## 2015-09-06 ENCOUNTER — Other Ambulatory Visit: Payer: Self-pay

## 2015-10-05 ENCOUNTER — Other Ambulatory Visit (INDEPENDENT_AMBULATORY_CARE_PROVIDER_SITE_OTHER): Payer: Medicare Other

## 2015-10-05 DIAGNOSIS — Z7901 Long term (current) use of anticoagulants: Secondary | ICD-10-CM | POA: Diagnosis not present

## 2015-10-05 DIAGNOSIS — Z5181 Encounter for therapeutic drug level monitoring: Secondary | ICD-10-CM

## 2015-10-05 LAB — PROTIME-INR
INR: 1.5 ratio — AB (ref 0.8–1.0)
PROTHROMBIN TIME: 15.7 s — AB (ref 9.6–13.1)

## 2015-10-06 ENCOUNTER — Telehealth: Payer: Self-pay | Admitting: *Deleted

## 2015-10-06 DIAGNOSIS — Z7901 Long term (current) use of anticoagulants: Secondary | ICD-10-CM

## 2015-10-06 NOTE — Telephone Encounter (Signed)
Lab ordered & medication list updated

## 2015-10-15 ENCOUNTER — Other Ambulatory Visit: Payer: Self-pay

## 2015-10-15 ENCOUNTER — Telehealth: Payer: Self-pay | Admitting: Internal Medicine

## 2015-10-15 NOTE — Telephone Encounter (Signed)
Patient stated she only wants to see Dr. Nicki Reaper due to her knowing all Past medical history. Patient is complaining of cold SX nasal congestion. Please advise.

## 2015-10-15 NOTE — Telephone Encounter (Signed)
The patient has been scheduled

## 2015-10-15 NOTE — Telephone Encounter (Signed)
Patient has been made aware and will wait for Monday.

## 2015-10-15 NOTE — Telephone Encounter (Signed)
Let her know that I have to leave early today.  I can see her at 2:00 on Monday 10/18/15.  If needs to be seen earlier - acute care.

## 2015-10-15 NOTE — Telephone Encounter (Signed)
Pt states that she has a cold and only wants to see Dr. Nicki Reaper.. Please advise.Marland Kitchen

## 2015-10-18 ENCOUNTER — Encounter: Payer: Self-pay | Admitting: Internal Medicine

## 2015-10-18 ENCOUNTER — Ambulatory Visit (INDEPENDENT_AMBULATORY_CARE_PROVIDER_SITE_OTHER): Payer: Medicare Other | Admitting: Internal Medicine

## 2015-10-18 VITALS — BP 120/82 | HR 82 | Temp 97.9°F | Wt 215.0 lb

## 2015-10-18 DIAGNOSIS — M79674 Pain in right toe(s): Secondary | ICD-10-CM

## 2015-10-18 DIAGNOSIS — M799 Soft tissue disorder, unspecified: Secondary | ICD-10-CM | POA: Diagnosis not present

## 2015-10-18 DIAGNOSIS — M7989 Other specified soft tissue disorders: Secondary | ICD-10-CM

## 2015-10-18 DIAGNOSIS — J329 Chronic sinusitis, unspecified: Secondary | ICD-10-CM | POA: Diagnosis not present

## 2015-10-18 DIAGNOSIS — Z7901 Long term (current) use of anticoagulants: Secondary | ICD-10-CM | POA: Diagnosis not present

## 2015-10-18 LAB — PROTIME-INR
INR: 2 ratio — AB (ref 0.8–1.0)
Prothrombin Time: 21.1 s — ABNORMAL HIGH (ref 9.6–13.1)

## 2015-10-18 MED ORDER — PREDNISONE 10 MG PO TABS: ORAL_TABLET | ORAL | 0 refills | Status: DC

## 2015-10-18 MED ORDER — FLUTICASONE PROPIONATE 50 MCG/ACT NA SUSP: 2.0000 | Freq: Every day | NASAL | 6 refills | Status: DC

## 2015-10-18 NOTE — Patient Instructions (Signed)
Saline nasal spray - flush nose at least 2-3x/day  flonase nasal spray - 2 sprays each nostril one time per day.  Do this in the evening.    Prednisone taper as directed.

## 2015-10-18 NOTE — Progress Notes (Signed)
Pre visit review using our clinic review tool, if applicable. No additional management support is needed unless otherwise documented below in the visit note. 

## 2015-10-18 NOTE — Progress Notes (Signed)
[name], female   DOB: 07/29/1945, 70 y.o.   MRN: [id number]   Subjective:    [name], female    DOB: 27-Sep-1945, 70 y.o.   MRN: [id number]  HPI  Patient here as a work in with concerns regarding cough, sinus pressure and earache.  Symptoms started 4-5 days ago.  Started with sore throat.  Throat is better.  Was having increased sinus pressure and congestion.  This is better.  She was using saline nasal spray.  Still with some ear fullness.  Some throat congestion and cough.  No chest tightness.  No increased wheezing.  Eating and drinking.  No nausea or vomiting.  [place] taking tylenol ES and benadryl.  She also report increased soft tissue - forearm.  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  No pain.  Does report pain - second toe.  No known injury. Her grandchildren step on her feet.     Past Medical History:  Diagnosis Date  . Allergy   . Asthma   . Chicken pox   . History of blood clots   . Hypercholesterolemia   . Hypertension    Past Surgical History:  Procedure Laterality Date  . blood clots  2008   Family History  Problem Relation Age of Onset  . Cancer Mother     Breast  . Heart disease Mother   . Stroke Mother   . Hypertension Mother   . Heart disease Father   . Stroke Father   . Hypertension Father   . Diabetes Father   . Colon cancer      paternal cousin   Social History   Social History  . Marital status: Widowed    Spouse name: N/A  . Number of children: N/A  . Years of education: N/A   Social History Main Topics  . Smoking status: Never Smoker  . Smokeless tobacco: Never Used  . Alcohol use No  . Drug use: No  . Sexual activity: Not Asked   Other Topics Concern  . None   Social History Narrative  . None    Outpatient Encounter Prescriptions as of 10/18/2015  Medication Sig  . acetaminophen (TYLENOL) 650 MG CR tablet Take 650 mg by mouth every 8 (eight) hours as needed for pain.  Marland Kitchen albuterol (PROVENTIL HFA) 108 (90 BASE)  MCG/ACT inhaler Inhale 2 puffs into the lungs every 6 (six) hours as needed for wheezing.  Marland Kitchen albuterol (PROVENTIL HFA;VENTOLIN HFA) 108 (90 Base) MCG/ACT inhaler Inhale 2 puffs into the lungs every 6 (six) hours as needed for wheezing or shortness of breath.  . citalopram (CELEXA) 20 MG tablet TAKE ONE (1) TABLET EACH DAY  . clotrimazole (MYCELEX) 10 MG troche Take 1 tablet (10 mg total) by mouth 3 (three) times daily.  . diphenhydrAMINE (BENADRYL) 25 mg capsule Take 25 mg by mouth every 6 (six) hours as needed.  . fluticasone (FLOVENT HFA) 110 MCG/ACT inhaler Inhale 2 puffs into the lungs 2 (two) times daily.  Marland Kitchen losartan (COZAAR) 50 MG tablet TAKE ONE (1) TABLET EACH DAY  . nystatin (MYCOSTATIN) 100000 UNIT/ML suspension Take 5 mLs (500,000 Units total) by mouth 3 (three) times daily.  . pantoprazole (PROTONIX) 40 MG tablet TAKE ONE (1) TABLET EACH DAY (Patient taking differently: TAKE ONE (1) TABLET EACH DAY AS NEEDED)  . rosuvastatin (CRESTOR) 5 MG tablet TAKE ONE (1) TABLET EACH DAY  . warfarin (COUMADIN) 5 MG tablet TAKE ONE (1) TABLET EACH DAY (Patient  taking differently: 10/06/15-take 5mg  on Tuesday & Saturday and 2.5mg  all other days)  . [DISCONTINUED] predniSONE (DELTASONE) 10 MG tablet Take 6 tablets x 1 day and then decrease by 1/2 tablet per day until down to zero mg.  . fluticasone (FLONASE) 50 MCG/ACT nasal spray Place 2 sprays into both nostrils daily.  . predniSONE (DELTASONE) 10 MG tablet Take 4 tablets x 1 day and then decrease by 1/2 tablet per day until down to zero mg.   No facility-administered encounter medications on file as of 10/18/2015.     Review of Systems  Constitutional: Negative for appetite change and unexpected weight change.  HENT: Positive for congestion, sinus pressure and sore throat.   Respiratory: Positive for cough. Negative for chest tightness and shortness of breath.   Cardiovascular: Negative for chest pain, palpitations and leg swelling.    Gastrointestinal: Negative for abdominal pain, diarrhea, nausea and vomiting.  Musculoskeletal: Negative for joint swelling and myalgias.  Skin: Negative for color change and rash.  Neurological: Negative for dizziness, light-headedness and headaches.  Psychiatric/Behavioral: Negative for agitation and dysphoric mood.       Objective:    Physical Exam  Constitutional: She appears well-developed and well-nourished. No distress.  HENT:  Nose: Nose normal.  Mouth/Throat: Oropharynx is clear and moist.  Neck: Neck supple. No thyromegaly present.  Cardiovascular: Normal rate and regular rhythm.   Pulmonary/Chest: Breath sounds normal. No respiratory distress. She has no wheezes.  Abdominal: Soft. Bowel sounds are normal. There is no tenderness.  Musculoskeletal: She exhibits no edema or tenderness.  Minimal pain to palpation over the second toe right foot.  No increased swelling.    Lymphadenopathy:    She has no cervical adenopathy.  Skin: No rash noted. No erythema.  Raised soft tissue mass - forearm.  Superficial.  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  Non tender.      BP 120/82   Pulse 82   Temp 97.9 F (36.6 C) (Oral)   Wt 215 lb (97.5 kg)   LMP 04/29/1997   SpO2 95%   BMI 36.90 kg/m  Wt Readings from Last 3 Encounters:  10/18/15 215 lb (97.5 kg)  06/15/15 218 lb 4 oz (99 kg)  04/08/15 216 lb (98 kg)     Lab Results  Component Value Date   WBC 10.6 (H) 04/19/2015   HGB 14.4 04/19/2015   HCT 43.2 04/19/2015   PLT 317.0 04/19/2015   GLUCOSE 88 04/19/2015   CHOL 263 (H) 03/18/2015   TRIG 220.0 (H) 03/18/2015   HDL 49.10 03/18/2015   LDLDIRECT 166.0 03/18/2015   LDLCALC 120 (H) 09/22/2014   ALT 15 05/04/2015   AST 21 05/04/2015   NA 136 04/19/2015   K 4.7 04/19/2015   CL 99 04/19/2015   CREATININE 0.96 04/19/2015   BUN 13 04/19/2015   CO2 30 04/19/2015   TSH 2.49 03/18/2015   INR 2.0 (H) 10/18/2015    Dg Chest 2 View  Result Date: 04/08/2015 CLINICAL DATA:   Cough and congestion for 1 week, dry barking cough, shortness of breath after speaking in sentences, feels as if she is unable to get enough air, history blood clots, asthma, hypertension EXAM: CHEST  2 VIEW COMPARISON:  06/08/2014 FINDINGS: Enlargement of cardiac silhouette. Tortuous aorta. Mediastinal contours and pulmonary vascularity normal. Mild chronic peribronchial thickening. No acute infiltrate, pleural effusion or pneumothorax. Bones demineralized. IMPRESSION: Chronic bronchitic changes without acute infiltrate. Electronically Signed   By: Lavonia Dana M.D.   On:  04/08/2015 16:08       Assessment & Plan:   Problem List Items Addressed This Visit    Long term current use of anticoagulant therapy    Recheck pt/inr today.       Sinusitis    With previous increased sinus pressure and sore throat.  Better now.  Increased cough.  Increased cough with forced expiration.  Hold on abx.  Lungs clear.  Saline nasal spray and flonase nasal spray as directed.  Prednisone taper as directed.  Inhalers if needed.  Follow.  Notify me if symptoms do not resolve or if they worsen.        Relevant Medications   predniSONE (DELTASONE) 10 MG tablet   fluticasone (FLONASE) 50 MCG/ACT nasal spray    Other Visit Diagnoses    Toe pain, right    -  Primary   minimal tenderness to palpation.  discussed further evaluation, including xray.  wants to hold.  support.  follow.    Long-term (current) use of anticoagulants       Soft tissue mass       soft tissue mass - forearm.  nontender.  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  discussed with her today.  she wants to monitor.  follow.        Einar Pheasant, MD

## 2015-10-19 ENCOUNTER — Encounter: Payer: Self-pay | Admitting: Internal Medicine

## 2015-10-19 NOTE — Assessment & Plan Note (Signed)
Recheck pt/inr today.   

## 2015-10-19 NOTE — Assessment & Plan Note (Signed)
With previous increased sinus pressure and sore throat.  Better now.  Increased cough.  Increased cough with forced expiration.  Hold on abx.  Lungs clear.  Saline nasal spray and flonase nasal spray as directed.  Prednisone taper as directed.  Inhalers if needed.  Follow.  Notify me if symptoms do not resolve or if they worsen.

## 2015-10-25 ENCOUNTER — Other Ambulatory Visit: Payer: Self-pay

## 2015-10-25 DIAGNOSIS — Z7901 Long term (current) use of anticoagulants: Principal | ICD-10-CM

## 2015-10-25 DIAGNOSIS — Z5181 Encounter for therapeutic drug level monitoring: Secondary | ICD-10-CM

## 2015-10-29 ENCOUNTER — Other Ambulatory Visit: Payer: Self-pay

## 2015-10-29 ENCOUNTER — Encounter: Payer: Self-pay | Admitting: *Deleted

## 2015-11-05 ENCOUNTER — Other Ambulatory Visit (INDEPENDENT_AMBULATORY_CARE_PROVIDER_SITE_OTHER): Payer: Medicare Other

## 2015-11-05 DIAGNOSIS — Z7901 Long term (current) use of anticoagulants: Secondary | ICD-10-CM

## 2015-11-05 DIAGNOSIS — Z5181 Encounter for therapeutic drug level monitoring: Secondary | ICD-10-CM

## 2015-11-05 LAB — PROTIME-INR
INR: 2.7 ratio — ABNORMAL HIGH (ref 0.8–1.0)
Prothrombin Time: 29.1 s — ABNORMAL HIGH (ref 9.6–13.1)

## 2015-11-08 ENCOUNTER — Other Ambulatory Visit: Payer: Self-pay

## 2015-11-08 DIAGNOSIS — Z5181 Encounter for therapeutic drug level monitoring: Secondary | ICD-10-CM

## 2015-11-08 DIAGNOSIS — Z7901 Long term (current) use of anticoagulants: Principal | ICD-10-CM

## 2015-11-09 ENCOUNTER — Other Ambulatory Visit (INDEPENDENT_AMBULATORY_CARE_PROVIDER_SITE_OTHER): Payer: Medicare Other

## 2015-11-09 DIAGNOSIS — Z5181 Encounter for therapeutic drug level monitoring: Secondary | ICD-10-CM | POA: Diagnosis not present

## 2015-11-09 DIAGNOSIS — Z7901 Long term (current) use of anticoagulants: Secondary | ICD-10-CM

## 2015-11-10 ENCOUNTER — Other Ambulatory Visit: Payer: Self-pay | Admitting: Internal Medicine

## 2015-11-10 LAB — PROTIME-INR
INR: 2.2 — ABNORMAL HIGH
PROTHROMBIN TIME: 22.4 s — AB (ref 9.0–11.5)

## 2015-11-13 ENCOUNTER — Ambulatory Visit: Payer: Self-pay

## 2015-11-16 ENCOUNTER — Other Ambulatory Visit: Payer: Self-pay | Admitting: Internal Medicine

## 2015-11-24 ENCOUNTER — Other Ambulatory Visit: Payer: Self-pay

## 2015-11-29 ENCOUNTER — Other Ambulatory Visit (INDEPENDENT_AMBULATORY_CARE_PROVIDER_SITE_OTHER): Payer: Medicare Other

## 2015-11-29 DIAGNOSIS — Z7901 Long term (current) use of anticoagulants: Secondary | ICD-10-CM

## 2015-11-29 DIAGNOSIS — Z5181 Encounter for therapeutic drug level monitoring: Secondary | ICD-10-CM

## 2015-11-29 LAB — PROTIME-INR
INR: 2.6 ratio — AB (ref 0.8–1.0)
PROTHROMBIN TIME: 28.4 s — AB (ref 9.6–13.1)

## 2015-12-01 ENCOUNTER — Other Ambulatory Visit: Payer: Self-pay | Admitting: Internal Medicine

## 2015-12-01 DIAGNOSIS — Z7901 Long term (current) use of anticoagulants: Secondary | ICD-10-CM

## 2015-12-01 NOTE — Progress Notes (Signed)
Order placed for f/u pt/inr 

## 2015-12-09 ENCOUNTER — Ambulatory Visit: Payer: Self-pay | Admitting: Internal Medicine

## 2015-12-16 ENCOUNTER — Ambulatory Visit: Payer: Medicare Other | Admitting: Internal Medicine

## 2015-12-16 DIAGNOSIS — Z0289 Encounter for other administrative examinations: Secondary | ICD-10-CM

## 2015-12-21 ENCOUNTER — Ambulatory Visit (INDEPENDENT_AMBULATORY_CARE_PROVIDER_SITE_OTHER): Payer: Medicare Other | Admitting: Internal Medicine

## 2015-12-21 ENCOUNTER — Encounter: Payer: Self-pay | Admitting: Internal Medicine

## 2015-12-21 ENCOUNTER — Other Ambulatory Visit: Payer: Medicare Other

## 2015-12-21 VITALS — BP 132/82 | HR 70 | Temp 98.1°F | Ht 64.0 in | Wt 220.4 lb

## 2015-12-21 DIAGNOSIS — Z7901 Long term (current) use of anticoagulants: Secondary | ICD-10-CM

## 2015-12-21 DIAGNOSIS — J209 Acute bronchitis, unspecified: Secondary | ICD-10-CM

## 2015-12-21 DIAGNOSIS — I10 Essential (primary) hypertension: Secondary | ICD-10-CM

## 2015-12-21 DIAGNOSIS — Z5181 Encounter for therapeutic drug level monitoring: Secondary | ICD-10-CM

## 2015-12-21 DIAGNOSIS — J329 Chronic sinusitis, unspecified: Secondary | ICD-10-CM | POA: Diagnosis not present

## 2015-12-21 DIAGNOSIS — Z1239 Encounter for other screening for malignant neoplasm of breast: Secondary | ICD-10-CM | POA: Diagnosis not present

## 2015-12-21 DIAGNOSIS — E7800 Pure hypercholesterolemia, unspecified: Secondary | ICD-10-CM

## 2015-12-21 DIAGNOSIS — F439 Reaction to severe stress, unspecified: Secondary | ICD-10-CM

## 2015-12-21 DIAGNOSIS — R739 Hyperglycemia, unspecified: Secondary | ICD-10-CM

## 2015-12-21 LAB — BASIC METABOLIC PANEL
BUN: 18 mg/dL (ref 6–23)
CALCIUM: 9.4 mg/dL (ref 8.4–10.5)
CO2: 30 meq/L (ref 19–32)
CREATININE: 1.15 mg/dL (ref 0.40–1.20)
Chloride: 106 mEq/L (ref 96–112)
GFR: 49.27 mL/min — ABNORMAL LOW (ref >60.00)
GLUCOSE: 103 mg/dL — AB (ref 70–99)
Potassium: 5 mEq/L (ref 3.5–5.1)
Sodium: 142 mEq/L (ref 135–145)

## 2015-12-21 LAB — LIPID PANEL
CHOLESTEROL: 192 mg/dL (ref 0–200)
HDL: 48.6 mg/dL (ref >39.00)
LDL Cholesterol: 105 mg/dL — ABNORMAL HIGH (ref 0–99)
NonHDL: 143.03
Total CHOL/HDL Ratio: 4
Triglycerides: 191 mg/dL — ABNORMAL HIGH (ref 0.0–149.0)
VLDL: 38.2 mg/dL (ref 0.0–40.0)

## 2015-12-21 LAB — HEMOGLOBIN A1C
HEMOGLOBIN A1C: 5.6 % (ref <5.7)
MEAN PLASMA GLUCOSE: 114 mg/dL

## 2015-12-21 LAB — HEPATIC FUNCTION PANEL
ALBUMIN: 4.1 g/dL (ref 3.5–5.2)
ALK PHOS: 67 U/L (ref 39–117)
ALT: 14 U/L (ref 0–35)
AST: 17 U/L (ref 0–37)
Bilirubin, Direct: 0.1 mg/dL (ref 0.0–0.3)
Total Bilirubin: 0.6 mg/dL (ref 0.2–1.2)
Total Protein: 7.2 g/dL (ref 6.0–8.3)

## 2015-12-21 LAB — PROTIME-INR
INR: 2.9 ratio — AB (ref 0.8–1.0)
PROTHROMBIN TIME: 30.8 s — AB (ref 9.6–13.1)

## 2015-12-21 MED ORDER — PREDNISONE 10 MG PO TABS: ORAL_TABLET | ORAL | 0 refills | Status: DC

## 2015-12-21 NOTE — Progress Notes (Signed)
[name], female   DOB: 09/05/1945, 70 y.o.   MRN: [id number]   Subjective:    [name], female    DOB: Sep 03, 1945, 70 y.o.   MRN: [id number]  HPI  Patient here for a scheduled follow up.  She has been having increased congestion for the last 2-3 weeks.  Intermittent sore throat.  Increased drainage.  Increased cough and congestion.  No nausea or vomiting.  No diarrhea.  Eating.  Bowels stable.  Hands stiff.  Toes hurt at times.  Handling stress.     Past Medical History:  Diagnosis Date  . Allergy   . Asthma   . Chicken pox   . History of blood clots   . Hypercholesterolemia   . Hypertension    Past Surgical History:  Procedure Laterality Date  . blood clots  2008   Family History  Problem Relation Age of Onset  . Cancer Mother     Breast  . Heart disease Mother   . Stroke Mother   . Hypertension Mother   . Heart disease Father   . Stroke Father   . Hypertension Father   . Diabetes Father   . Colon cancer      paternal cousin   Social History   Social History  . Marital status: Widowed    Spouse name: N/A  . Number of children: N/A  . Years of education: N/A   Social History Main Topics  . Smoking status: Never Smoker  . Smokeless tobacco: Never Used  . Alcohol use No  . Drug use: No  . Sexual activity: Not Asked   Other Topics Concern  . None   Social History Narrative  . None    Outpatient Encounter Prescriptions as of 12/21/2015  Medication Sig  . acetaminophen (TYLENOL) 650 MG CR tablet Take 650 mg by mouth every 8 (eight) hours as needed for pain.  Marland Kitchen albuterol (PROVENTIL HFA) 108 (90 BASE) MCG/ACT inhaler Inhale 2 puffs into the lungs every 6 (six) hours as needed for wheezing.  Marland Kitchen albuterol (PROVENTIL HFA;VENTOLIN HFA) 108 (90 Base) MCG/ACT inhaler Inhale 2 puffs into the lungs every 6 (six) hours as needed for wheezing or shortness of breath.  . citalopram (CELEXA) 20 MG tablet TAKE ONE (1) TABLET EACH DAY  .  clotrimazole (MYCELEX) 10 MG troche Take 1 tablet (10 mg total) by mouth 3 (three) times daily.  . diphenhydrAMINE (BENADRYL) 25 mg capsule Take 25 mg by mouth every 6 (six) hours as needed.  . fluticasone (FLONASE) 50 MCG/ACT nasal spray Place 2 sprays into both nostrils daily.  . fluticasone (FLOVENT HFA) 110 MCG/ACT inhaler Inhale 2 puffs into the lungs 2 (two) times daily.  Marland Kitchen losartan (COZAAR) 50 MG tablet TAKE ONE (1) TABLET EACH DAY  . nystatin (MYCOSTATIN) 100000 UNIT/ML suspension Take 5 mLs (500,000 Units total) by mouth 3 (three) times daily.  . pantoprazole (PROTONIX) 40 MG tablet TAKE ONE (1) TABLET EACH DAY (Patient taking differently: TAKE ONE (1) TABLET EACH DAY AS NEEDED)  . predniSONE (DELTASONE) 10 MG tablet Take 6 tablets x 1 day and then decrease by 1/2 tablet per day until down to zero mg.  . rosuvastatin (CRESTOR) 5 MG tablet TAKE ONE (1) TABLET EACH DAY  . warfarin (COUMADIN) 5 MG tablet TAKE ONE (1) TABLET EACH DAY (Patient taking differently: 10/06/15-take 5mg  on Tuesday & Saturday and 2.5mg  all other days)  . [DISCONTINUED] predniSONE (DELTASONE) 10 MG tablet Take 4  tablets x 1 day and then decrease by 1/2 tablet per day until down to zero mg.   No facility-administered encounter medications on file as of 12/21/2015.     Review of Systems  Constitutional: Negative for appetite change and unexpected weight change.  HENT: Positive for congestion, postnasal drip and sore throat.   Respiratory: Positive for cough. Negative for chest tightness and shortness of breath.   Cardiovascular: Negative for chest pain, palpitations and leg swelling.  Gastrointestinal: Negative for abdominal pain, diarrhea, nausea and vomiting.  Genitourinary: Negative for difficulty urinating and dysuria.  Musculoskeletal:       Hand stiffness and toe pain as outlined.    Skin: Negative for color change and rash.  Neurological: Negative for dizziness, light-headedness and headaches.    Psychiatric/Behavioral: Negative for agitation and dysphoric mood.       Objective:     Blood pressure rechecked by me:  132/82  Physical Exam  Constitutional: She appears well-developed and well-nourished. No distress.  HENT:  Nose: Nose normal.  Mouth/Throat: Oropharynx is clear and moist.  Neck: Neck supple. No thyromegaly present.  Cardiovascular: Normal rate and regular rhythm.   Pulmonary/Chest: Breath sounds normal. No respiratory distress. She has no wheezes.  Increased cough with forced expiration.    Abdominal: Soft. Bowel sounds are normal. There is no tenderness.  Musculoskeletal: She exhibits no edema or tenderness.  Lymphadenopathy:    She has no cervical adenopathy.  Skin: No rash noted. No erythema.  Psychiatric: She has a normal mood and affect. Her behavior is normal.    BP 132/82   Pulse 70   Temp 98.1 F (36.7 C) (Oral)   Ht 5\' 4"  (1.626 m)   Wt 220 lb 6.4 oz (100 kg)   LMP 04/29/1997   SpO2 94%   BMI 37.83 kg/m  Wt Readings from Last 3 Encounters:  12/21/15 220 lb 6.4 oz (100 kg)  10/18/15 215 lb (97.5 kg)  06/15/15 218 lb 4 oz (99 kg)     Lab Results  Component Value Date   WBC 10.6 (H) 04/19/2015   HGB 14.4 04/19/2015   HCT 43.2 04/19/2015   PLT 317.0 04/19/2015   GLUCOSE 103 (H) 12/21/2015   CHOL 192 12/21/2015   TRIG 191.0 (H) 12/21/2015   HDL 48.60 12/21/2015   LDLDIRECT 166.0 03/18/2015   LDLCALC 105 (H) 12/21/2015   ALT 14 12/21/2015   AST 17 12/21/2015   NA 142 12/21/2015   K 5.0 12/21/2015   CL 106 12/21/2015   CREATININE 1.15 12/21/2015   BUN 18 12/21/2015   CO2 30 12/21/2015   TSH 2.49 03/18/2015   INR 2.9 (H) 12/21/2015   HGBA1C 5.6 12/21/2015    Dg Chest 2 View  Result Date: 04/08/2015 CLINICAL DATA:  Cough and congestion for 1 week, dry barking cough, shortness of breath after speaking in sentences, feels as if she is unable to get enough air, history blood clots, asthma, hypertension EXAM: CHEST  2 VIEW  COMPARISON:  06/08/2014 FINDINGS: Enlargement of cardiac silhouette. Tortuous aorta. Mediastinal contours and pulmonary vascularity normal. Mild chronic peribronchial thickening. No acute infiltrate, pleural effusion or pneumothorax. Bones demineralized. IMPRESSION: Chronic bronchitic changes without acute infiltrate. Electronically Signed   By: Lavonia Dana M.D.   On: 04/08/2015 16:08       Assessment & Plan:   Problem List Items Addressed This Visit    Acute bronchitis    Symptoms persistent as outlined.  Increased cough and congestion.  mucinex  as directed.  Inhalers if needed.  Prednisone taper as directed.  Follow.  Notify me if persistent problems.  Continue saline rinses.        Essential hypertension, benign    Blood pressure on recheck doing well.  Blood pressure has been doing well.  Follow.  Same medication regimen.  Follow metabolic panel.       Relevant Orders   Basic metabolic panel (Completed)   Hypercholesterolemia    On crestor.  Low cholesterol diet and exercise.  Follow lipid panel and liver function tests.        Relevant Orders   Hepatic function panel (Completed)   Lipid panel (Completed)   Sinusitis   Relevant Medications   predniSONE (DELTASONE) 10 MG tablet   Stress    On citalopram.  Stable.  Follow.         Other Visit Diagnoses    Encounter for breast cancer screening other than mammogram    -  Primary   Relevant Orders   MM Digital Screening   Anticoagulated on Coumadin       Relevant Orders   Protime-INR (Completed)   Blood glucose elevated       Relevant Orders   HgB A1c (Completed)       Einar Pheasant, MD

## 2015-12-21 NOTE — Progress Notes (Signed)
Pre visit review using our clinic review tool, if applicable. No additional management support is needed unless otherwise documented below in the visit note. 

## 2015-12-22 ENCOUNTER — Other Ambulatory Visit: Payer: Self-pay | Admitting: Internal Medicine

## 2015-12-22 DIAGNOSIS — Z7901 Long term (current) use of anticoagulants: Secondary | ICD-10-CM

## 2015-12-22 DIAGNOSIS — R7989 Other specified abnormal findings of blood chemistry: Secondary | ICD-10-CM

## 2015-12-22 NOTE — Progress Notes (Signed)
Order placed for f/u labs.  

## 2015-12-25 ENCOUNTER — Encounter: Payer: Self-pay | Admitting: Internal Medicine

## 2015-12-25 NOTE — Assessment & Plan Note (Signed)
On crestor.  Low cholesterol diet and exercise.  Follow lipid panel and liver function tests.   

## 2015-12-25 NOTE — Assessment & Plan Note (Signed)
Blood pressure on recheck doing well.  Blood pressure has been doing well.  Follow.  Same medication regimen.  Follow metabolic panel.

## 2015-12-25 NOTE — Assessment & Plan Note (Signed)
Symptoms persistent as outlined.  Increased cough and congestion.  mucinex as directed.  Inhalers if needed.  Prednisone taper as directed.  Follow.  Notify me if persistent problems.  Continue saline rinses.

## 2015-12-25 NOTE — Assessment & Plan Note (Signed)
On citalopram.  Stable.  Follow.   

## 2015-12-27 ENCOUNTER — Other Ambulatory Visit (INDEPENDENT_AMBULATORY_CARE_PROVIDER_SITE_OTHER): Payer: Medicare Other

## 2015-12-27 DIAGNOSIS — Z7901 Long term (current) use of anticoagulants: Secondary | ICD-10-CM

## 2015-12-27 DIAGNOSIS — R7989 Other specified abnormal findings of blood chemistry: Secondary | ICD-10-CM | POA: Diagnosis not present

## 2015-12-27 DIAGNOSIS — Z5181 Encounter for therapeutic drug level monitoring: Secondary | ICD-10-CM | POA: Diagnosis not present

## 2015-12-27 LAB — PROTIME-INR
INR: 1.8 ratio — AB (ref 0.8–1.0)
PROTHROMBIN TIME: 19.1 s — AB (ref 9.6–13.1)

## 2015-12-27 LAB — BASIC METABOLIC PANEL
BUN: 21 mg/dL (ref 6–23)
CO2: 30 mEq/L (ref 19–32)
Calcium: 8.8 mg/dL (ref 8.4–10.5)
Chloride: 101 mEq/L (ref 96–112)
Creatinine, Ser: 0.99 mg/dL (ref 0.40–1.20)
GFR: 58.57 mL/min — AB (ref >60.00)
Glucose, Bld: 100 mg/dL — ABNORMAL HIGH (ref 70–99)
POTASSIUM: 4.6 meq/L (ref 3.5–5.1)
SODIUM: 138 meq/L (ref 135–145)

## 2015-12-28 ENCOUNTER — Telehealth: Payer: Self-pay

## 2015-12-28 NOTE — Telephone Encounter (Signed)
Patient is taking 2.5 every day except Tue and Sat she takes 5mg 

## 2015-12-28 NOTE — Telephone Encounter (Signed)
-----   Message from Einar Pheasant, MD sent at 12/28/2015  4:40 AM EST ----- Please call pt and let her know that her INR is slightly decreased now.  Need to confirm how she is taking her medication.  Confirm taking correctly.  Kidney function test improved from previous check.  Recheck pt/inr Thursday or Friday of this week (12/30/15 or 12/31/15).

## 2015-12-28 NOTE — Telephone Encounter (Signed)
Continue current dose of coumadin and recheck pt/inr as noted in previous message.

## 2015-12-30 ENCOUNTER — Other Ambulatory Visit: Payer: Medicare Other

## 2016-02-03 ENCOUNTER — Ambulatory Visit: Payer: Medicare Other

## 2016-02-29 ENCOUNTER — Other Ambulatory Visit: Payer: Medicare Other

## 2016-03-01 ENCOUNTER — Other Ambulatory Visit (INDEPENDENT_AMBULATORY_CARE_PROVIDER_SITE_OTHER): Payer: Medicare Other

## 2016-03-01 DIAGNOSIS — Z7901 Long term (current) use of anticoagulants: Secondary | ICD-10-CM | POA: Diagnosis not present

## 2016-03-01 DIAGNOSIS — Z5181 Encounter for therapeutic drug level monitoring: Secondary | ICD-10-CM | POA: Diagnosis not present

## 2016-03-01 LAB — PROTIME-INR
INR: 1.9 ratio — ABNORMAL HIGH (ref 0.8–1.0)
Prothrombin Time: 19.9 s — ABNORMAL HIGH (ref 9.6–13.1)

## 2016-03-02 ENCOUNTER — Telehealth: Payer: Self-pay | Admitting: *Deleted

## 2016-03-02 DIAGNOSIS — Z7901 Long term (current) use of anticoagulants: Secondary | ICD-10-CM

## 2016-03-02 NOTE — Telephone Encounter (Signed)
Updated Coumadin directions

## 2016-03-02 NOTE — Telephone Encounter (Signed)
Standing order placed for PT/INR 

## 2016-03-07 ENCOUNTER — Ambulatory Visit (INDEPENDENT_AMBULATORY_CARE_PROVIDER_SITE_OTHER): Payer: Medicare Other | Admitting: Internal Medicine

## 2016-03-07 ENCOUNTER — Encounter: Payer: Self-pay | Admitting: Internal Medicine

## 2016-03-07 ENCOUNTER — Ambulatory Visit (INDEPENDENT_AMBULATORY_CARE_PROVIDER_SITE_OTHER): Payer: Medicare Other

## 2016-03-07 VITALS — BP 120/74 | HR 73 | Temp 98.0°F | Resp 18 | Ht 64.0 in | Wt 224.0 lb

## 2016-03-07 DIAGNOSIS — E7800 Pure hypercholesterolemia, unspecified: Secondary | ICD-10-CM

## 2016-03-07 DIAGNOSIS — Z7901 Long term (current) use of anticoagulants: Secondary | ICD-10-CM

## 2016-03-07 DIAGNOSIS — Z23 Encounter for immunization: Secondary | ICD-10-CM | POA: Diagnosis not present

## 2016-03-07 DIAGNOSIS — Z0000 Encounter for general adult medical examination without abnormal findings: Secondary | ICD-10-CM

## 2016-03-07 DIAGNOSIS — F439 Reaction to severe stress, unspecified: Secondary | ICD-10-CM

## 2016-03-07 DIAGNOSIS — J4520 Mild intermittent asthma, uncomplicated: Secondary | ICD-10-CM

## 2016-03-07 DIAGNOSIS — M25552 Pain in left hip: Secondary | ICD-10-CM

## 2016-03-07 DIAGNOSIS — S79912A Unspecified injury of left hip, initial encounter: Secondary | ICD-10-CM | POA: Diagnosis not present

## 2016-03-07 DIAGNOSIS — I10 Essential (primary) hypertension: Secondary | ICD-10-CM

## 2016-03-07 LAB — PROTIME-INR
INR: 2.4 ratio — ABNORMAL HIGH (ref 0.8–1.0)
Prothrombin Time: 25.3 s — ABNORMAL HIGH (ref 9.6–13.1)

## 2016-03-07 NOTE — Progress Notes (Signed)
[name], female   DOB: 1945-12-11, 71 y.o.   MRN: [id number]   Subjective:    [name], female    DOB: Jun 16, 1945, 71 y.o.   MRN: [id number]  HPI  Patient here for her physical exam.  She states she has been doing relatively well.  She did fall two weeks ago.  Tripped going into the bathroom.  Hit her right hand, right elbow and her breast.  Is having persistent pain in her left groin.  Getting up and down and in and out of the car aggravates.  No chest pain.  No sob.  No acid reflux.  No abdominal pain.  Bowels stable.     Past Medical History:  Diagnosis Date  . Allergy   . Asthma   . Chicken pox   . History of blood clots   . Hypercholesterolemia   . Hypertension    Past Surgical History:  Procedure Laterality Date  . blood clots  2008   Family History  Problem Relation Age of Onset  . Cancer Mother     Breast  . Heart disease Mother   . Stroke Mother   . Hypertension Mother   . Heart disease Father   . Stroke Father   . Hypertension Father   . Diabetes Father   . Colon cancer      paternal cousin   Social History   Social History  . Marital status: Widowed    Spouse name: N/A  . Number of children: N/A  . Years of education: N/A   Social History Main Topics  . Smoking status: Never Smoker  . Smokeless tobacco: Never Used  . Alcohol use No  . Drug use: No  . Sexual activity: Not Asked   Other Topics Concern  . None   Social History Narrative  . None    Outpatient Encounter Prescriptions as of 03/07/2016  Medication Sig  . acetaminophen (TYLENOL) 650 MG CR tablet Take 650 mg by mouth every 8 (eight) hours as needed for pain.  Marland Kitchen albuterol (PROVENTIL HFA) 108 (90 BASE) MCG/ACT inhaler Inhale 2 puffs into the lungs every 6 (six) hours as needed for wheezing.  Marland Kitchen albuterol (PROVENTIL HFA;VENTOLIN HFA) 108 (90 Base) MCG/ACT inhaler Inhale 2 puffs into the lungs every 6 (six) hours as needed for wheezing or shortness of breath.  .  citalopram (CELEXA) 20 MG tablet TAKE ONE (1) TABLET EACH DAY  . clotrimazole (MYCELEX) 10 MG troche Take 1 tablet (10 mg total) by mouth 3 (three) times daily.  . diphenhydrAMINE (BENADRYL) 25 mg capsule Take 25 mg by mouth every 6 (six) hours as needed.  . fluticasone (FLONASE) 50 MCG/ACT nasal spray Place 2 sprays into both nostrils daily.  . fluticasone (FLOVENT HFA) 110 MCG/ACT inhaler Inhale 2 puffs into the lungs 2 (two) times daily.  Marland Kitchen losartan (COZAAR) 50 MG tablet TAKE ONE (1) TABLET EACH DAY  . nystatin (MYCOSTATIN) 100000 UNIT/ML suspension Take 5 mLs (500,000 Units total) by mouth 3 (three) times daily.  . pantoprazole (PROTONIX) 40 MG tablet TAKE ONE (1) TABLET EACH DAY (Patient taking differently: TAKE ONE (1) TABLET EACH DAY AS NEEDED)  . predniSONE (DELTASONE) 10 MG tablet Take 6 tablets x 1 day and then decrease by 1/2 tablet per day until down to zero mg.  . rosuvastatin (CRESTOR) 5 MG tablet TAKE ONE (1) TABLET EACH DAY  . warfarin (COUMADIN) 5 MG tablet TAKE ONE (1) TABLET EACH DAY (Patient  taking differently: 03/02/16-take 5mg  on Tuesday & Saturday and 2.5mg  all other days)   No facility-administered encounter medications on file as of 03/07/2016.     Review of Systems  Constitutional: Negative for appetite change and unexpected weight change.  HENT: Negative for congestion and sinus pressure.   Eyes: Negative for pain and visual disturbance.  Respiratory: Negative for cough, chest tightness and shortness of breath.   Cardiovascular: Negative for chest pain, palpitations and leg swelling.  Gastrointestinal: Negative for abdominal pain, diarrhea, nausea and vomiting.  Genitourinary: Negative for difficulty urinating and dysuria.  Musculoskeletal:       Some persistent left groin pain.  No significant pain right elbow or right hand.    Skin: Negative for color change and rash.  Neurological: Negative for dizziness, light-headedness and headaches.  Hematological: Negative  for adenopathy. Does not bruise/bleed easily.  Psychiatric/Behavioral: Negative for agitation and dysphoric mood.       Objective:    Physical Exam  Constitutional: She is oriented to person, place, and time. She appears well-developed and well-nourished. No distress.  HENT:  Nose: Nose normal.  Mouth/Throat: Oropharynx is clear and moist.  Eyes: Right eye exhibits no discharge. Left eye exhibits no discharge. No scleral icterus.  Neck: Neck supple. No thyromegaly present.  Cardiovascular: Normal rate and regular rhythm.   Pulmonary/Chest: Breath sounds normal. No accessory muscle usage. No tachypnea. No respiratory distress. She has no decreased breath sounds. She has no wheezes. She has no rhonchi. Right breast exhibits no inverted nipple, no mass, no nipple discharge and no tenderness (no axillary adenopathy). Left breast exhibits no inverted nipple, no mass, no nipple discharge and no tenderness (no axilarry adenopathy).  Abdominal: Soft. Bowel sounds are normal. There is no tenderness.  Musculoskeletal: She exhibits no edema or tenderness.  Increased pain in left groin - abduction and with rotation of her hip.    Lymphadenopathy:    She has no cervical adenopathy.  Neurological: She is alert and oriented to person, place, and time.  Skin: Skin is warm. No rash noted. No erythema.  Resolving hematoma - right hand and breast.    Psychiatric: She has a normal mood and affect. Her behavior is normal.    BP 120/74 (BP Location: Left Arm, Patient Position: Sitting, Cuff Size: Large)   Pulse 73   Temp 98 F (36.7 C) (Oral)   Resp 18   Ht 5\' 4"  (1.626 m)   Wt 224 lb (101.6 kg)   LMP 04/29/1997   SpO2 93%   BMI 38.45 kg/m  Wt Readings from Last 3 Encounters:  03/07/16 224 lb (101.6 kg)  12/21/15 220 lb 6.4 oz (100 kg)  10/18/15 215 lb (97.5 kg)     Lab Results  Component Value Date   WBC 10.6 (H) 04/19/2015   HGB 14.4 04/19/2015   HCT 43.2 04/19/2015   PLT 317.0  04/19/2015   GLUCOSE 100 (H) 12/27/2015   CHOL 192 12/21/2015   TRIG 191.0 (H) 12/21/2015   HDL 48.60 12/21/2015   LDLDIRECT 166.0 03/18/2015   LDLCALC 105 (H) 12/21/2015   ALT 14 12/21/2015   AST 17 12/21/2015   NA 138 12/27/2015   K 4.6 12/27/2015   CL 101 12/27/2015   CREATININE 0.99 12/27/2015   BUN 21 12/27/2015   CO2 30 12/27/2015   TSH 2.49 03/18/2015   INR 2.4 (H) 03/07/2016   HGBA1C 5.6 12/21/2015    Dg Chest 2 View  Result Date: 04/08/2015 CLINICAL DATA:  Cough and congestion for 1 week, dry barking cough, shortness of breath after speaking in sentences, feels as if she is unable to get enough air, history blood clots, asthma, hypertension EXAM: CHEST  2 VIEW COMPARISON:  06/08/2014 FINDINGS: Enlargement of cardiac silhouette. Tortuous aorta. Mediastinal contours and pulmonary vascularity normal. Mild chronic peribronchial thickening. No acute infiltrate, pleural effusion or pneumothorax. Bones demineralized. IMPRESSION: Chronic bronchitic changes without acute infiltrate. Electronically Signed   By: Lavonia Dana M.D.   On: 04/08/2015 16:08       Assessment & Plan:   Problem List Items Addressed This Visit    Asthma    Breathing stable.  Follow.        Essential hypertension, benign    Blood pressure under good control.  Continue same medication regimen.  Follow pressures.  Follow metabolic panel.        Health care maintenance    Physical today 03/07/16.  Scheduled for mammogram.  Colonoscopy per report 2012.  She reports recommended f/u in 10 years.        Hypercholesterolemia    Low cholesterol diet and exercise.  Follow lipid panel and liver function tests.  On crestor.        Left hip pain - Primary    Left groin pain.  S/p fall.  Will check xray.  Increased pain with getting up and down and getting in and out of the car.        Relevant Orders   DG HIP UNILAT WITH PELVIS 2-3 VIEWS LEFT (Completed)   Long term current use of anticoagulant therapy     Check pt/inr today.        Stress    Doing well.  On citalopram.  Follow.         Other Visit Diagnoses    Current use of long term anticoagulation       Encounter for immunization       Relevant Orders   Flu Vaccine QUAD 36+ mos IM (Completed)       Einar Pheasant, MD

## 2016-03-07 NOTE — Progress Notes (Signed)
Pre-visit discussion using our clinic review tool. No additional management support is needed unless otherwise documented below in the visit note.  

## 2016-03-10 DIAGNOSIS — M25552 Pain in left hip: Secondary | ICD-10-CM

## 2016-03-13 ENCOUNTER — Encounter: Payer: Self-pay | Admitting: Internal Medicine

## 2016-03-13 NOTE — Assessment & Plan Note (Signed)
Blood pressure under good control.  Continue same medication regimen.  Follow pressures.  Follow metabolic panel.   

## 2016-03-13 NOTE — Assessment & Plan Note (Signed)
Low cholesterol diet and exercise.  Follow lipid panel and liver function tests.  On crestor.   

## 2016-03-13 NOTE — Assessment & Plan Note (Signed)
Breathing stable.  Follow.    

## 2016-03-13 NOTE — Assessment & Plan Note (Signed)
Physical today 03/07/16.  Scheduled for mammogram.  Colonoscopy per report 2012.  She reports recommended f/u in 10 years.

## 2016-03-13 NOTE — Assessment & Plan Note (Signed)
Doing well.  On citalopram.  Follow.

## 2016-03-13 NOTE — Assessment & Plan Note (Signed)
Left groin pain.  S/p fall.  Will check xray.  Increased pain with getting up and down and getting in and out of the car.

## 2016-03-13 NOTE — Assessment & Plan Note (Signed)
Check pt/inr today.  

## 2016-03-14 ENCOUNTER — Ambulatory Visit
Admission: RE | Admit: 2016-03-14 | Discharge: 2016-03-14 | Disposition: A | Discharge status: Home / Self Care | Payer: Medicare Other | Source: Ambulatory Visit | Attending: Internal Medicine | Admitting: Internal Medicine

## 2016-03-14 DIAGNOSIS — Z1239 Encounter for other screening for malignant neoplasm of breast: Secondary | ICD-10-CM

## 2016-03-14 DIAGNOSIS — Z1231 Encounter for screening mammogram for malignant neoplasm of breast: Secondary | ICD-10-CM | POA: Insufficient documentation

## 2016-03-15 NOTE — Telephone Encounter (Signed)
Order placed for ortho referral.   

## 2016-03-16 ENCOUNTER — Other Ambulatory Visit: Payer: Medicare Other

## 2016-03-28 DIAGNOSIS — M1612 Unilateral primary osteoarthritis, left hip: Secondary | ICD-10-CM | POA: Diagnosis not present

## 2016-03-31 ENCOUNTER — Other Ambulatory Visit: Payer: Self-pay | Admitting: Specialist

## 2016-03-31 DIAGNOSIS — M1612 Unilateral primary osteoarthritis, left hip: Secondary | ICD-10-CM

## 2016-04-01 ENCOUNTER — Other Ambulatory Visit: Payer: Self-pay | Admitting: Internal Medicine

## 2016-04-13 ENCOUNTER — Ambulatory Visit: Payer: Medicare Other

## 2016-04-25 ENCOUNTER — Ambulatory Visit
Admission: RE | Admit: 2016-04-25 | Discharge: 2016-04-25 | Disposition: A | Discharge status: Home / Self Care | Payer: Medicare Other | Source: Ambulatory Visit | Attending: Specialist | Admitting: Specialist

## 2016-04-25 DIAGNOSIS — M1612 Unilateral primary osteoarthritis, left hip: Secondary | ICD-10-CM | POA: Diagnosis not present

## 2016-05-05 DIAGNOSIS — M1612 Unilateral primary osteoarthritis, left hip: Secondary | ICD-10-CM | POA: Diagnosis not present

## 2016-05-11 ENCOUNTER — Telehealth: Payer: Self-pay | Admitting: Internal Medicine

## 2016-05-11 NOTE — Telephone Encounter (Signed)
Left pt message asking to call Allison back directly at [phone] to schedule AWV. Thanks! °

## 2016-05-18 ENCOUNTER — Other Ambulatory Visit: Payer: Self-pay | Admitting: Internal Medicine

## 2016-05-23 ENCOUNTER — Encounter: Payer: Self-pay | Admitting: Internal Medicine

## 2016-05-23 ENCOUNTER — Ambulatory Visit (INDEPENDENT_AMBULATORY_CARE_PROVIDER_SITE_OTHER): Payer: Medicare Other | Admitting: Internal Medicine

## 2016-05-23 VITALS — BP 132/78 | HR 74 | Temp 98.6°F | Resp 12 | Ht 64.0 in | Wt 220.8 lb

## 2016-05-23 DIAGNOSIS — Z7901 Long term (current) use of anticoagulants: Secondary | ICD-10-CM | POA: Diagnosis not present

## 2016-05-23 DIAGNOSIS — E7800 Pure hypercholesterolemia, unspecified: Secondary | ICD-10-CM

## 2016-05-23 DIAGNOSIS — F439 Reaction to severe stress, unspecified: Secondary | ICD-10-CM

## 2016-05-23 DIAGNOSIS — Z23 Encounter for immunization: Secondary | ICD-10-CM

## 2016-05-23 DIAGNOSIS — I10 Essential (primary) hypertension: Secondary | ICD-10-CM | POA: Diagnosis not present

## 2016-05-23 DIAGNOSIS — R739 Hyperglycemia, unspecified: Secondary | ICD-10-CM

## 2016-05-23 DIAGNOSIS — Z86718 Personal history of other venous thrombosis and embolism: Secondary | ICD-10-CM

## 2016-05-23 DIAGNOSIS — E2839 Other primary ovarian failure: Secondary | ICD-10-CM | POA: Diagnosis not present

## 2016-05-23 DIAGNOSIS — M25552 Pain in left hip: Secondary | ICD-10-CM | POA: Diagnosis not present

## 2016-05-23 NOTE — Progress Notes (Signed)
Pre-visit discussion using our clinic review tool. No additional management support is needed unless otherwise documented below in the visit note.  

## 2016-05-23 NOTE — Progress Notes (Signed)
[name], female   DOB: May 16, 1945, 71 y.o.   MRN: [id number]   Subjective:    [name], female    DOB: Jan 08, 1946, 71 y.o.   MRN: [id number]  HPI  Patient here for a scheduled follow up.  She reports she is doing relatively well.  She saw Dr Tina Duarte for hip pain.  Had MRI.  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  Gave her tramadol.  She states she is doing better.  Getting around better.  No chest pain.  Breathing stable.  No acid reflux.  No abdominal pain.  Bowels moving.  Handling stress.     Past Medical History:  Diagnosis Date  . Allergy   . Asthma   . Chicken pox   . History of blood clots   . Hypercholesterolemia   . Hypertension    Past Surgical History:  Procedure Laterality Date  . blood clots  2008   Family History  Problem Relation Age of Onset  . Cancer Mother     Breast  . Heart disease Mother   . Stroke Mother   . Hypertension Mother   . Breast cancer Mother     17's  . Heart disease Father   . Stroke Father   . Hypertension Father   . Diabetes Father   . Colon cancer      paternal cousin   Social History   Social History  . Marital status: Widowed    Spouse name: N/A  . Number of children: N/A  . Years of education: N/A   Social History Main Topics  . Smoking status: Never Smoker  . Smokeless tobacco: Never Used  . Alcohol use No  . Drug use: No  . Sexual activity: Not Asked   Other Topics Concern  . None   Social History Narrative  . None    Outpatient Encounter Prescriptions as of 05/23/2016  Medication Sig  . acetaminophen (TYLENOL) 650 MG CR tablet Take 650 mg by mouth every 8 (eight) hours as needed for pain.  Marland Kitchen albuterol (PROVENTIL HFA) 108 (90 BASE) MCG/ACT inhaler Inhale 2 puffs into the lungs every 6 (six) hours as needed for wheezing.  Marland Kitchen albuterol (PROVENTIL HFA;VENTOLIN HFA) 108 (90 Base) MCG/ACT inhaler Inhale 2 puffs into the lungs every 6 (six) hours as needed  for wheezing or shortness of breath.  . citalopram (CELEXA) 20 MG tablet TAKE ONE (1) TABLET EACH DAY  . clotrimazole (MYCELEX) 10 MG troche Take 1 tablet (10 mg total) by mouth 3 (three) times daily.  . diphenhydrAMINE (BENADRYL) 25 mg capsule Take 25 mg by mouth every 6 (six) hours as needed.  . fluticasone (FLONASE) 50 MCG/ACT nasal spray Place 2 sprays into both nostrils daily.  . fluticasone (FLOVENT HFA) 110 MCG/ACT inhaler Inhale 2 puffs into the lungs 2 (two) times daily.  Marland Kitchen losartan (COZAAR) 50 MG tablet TAKE ONE (1) TABLET EACH DAY  . nystatin (MYCOSTATIN) 100000 UNIT/ML suspension Take 5 mLs (500,000 Units total) by mouth 3 (three) times daily.  . pantoprazole (PROTONIX) 40 MG tablet TAKE ONE (1) TABLET EACH DAY (Patient taking differently: TAKE ONE (1) TABLET EACH DAY AS NEEDED)  . predniSONE (DELTASONE) 10 MG tablet Take 6 tablets x 1 day and then decrease by 1/2 tablet per day until down to zero mg.  . rosuvastatin (CRESTOR) 5 MG tablet TAKE ONE (1) TABLET EACH DAY  . warfarin (COUMADIN) 5 MG tablet TAKE ONE (  1) TABLET EACH DAY   No facility-administered encounter medications on file as of 05/23/2016.     Review of Systems  Constitutional: Negative for appetite change and unexpected weight change.  HENT: Negative for congestion and sinus pressure.   Respiratory: Negative for cough, chest tightness and shortness of breath.   Cardiovascular: Negative for chest pain, palpitations and leg swelling.  Gastrointestinal: Negative for abdominal pain, diarrhea, nausea and vomiting.  Genitourinary: Negative for difficulty urinating and dysuria.  Musculoskeletal: Negative for joint swelling and myalgias.  Skin: Negative for color change and rash.  Neurological: Negative for dizziness, light-headedness and headaches.  Psychiatric/Behavioral: Negative for agitation and dysphoric mood.       Objective:     Blood pressure rechecked by me:  132/78  Physical Exam  Constitutional: She  appears well-developed and well-nourished. No distress.  HENT:  Nose: Nose normal.  Mouth/Throat: Oropharynx is clear and moist.  Neck: Neck supple. No thyromegaly present.  Cardiovascular: Normal rate and regular rhythm.   Pulmonary/Chest: Breath sounds normal. No respiratory distress. She has no wheezes.  Abdominal: Soft. Bowel sounds are normal. There is no tenderness.  Musculoskeletal: She exhibits no edema or tenderness.  Lymphadenopathy:    She has no cervical adenopathy.  Skin: No rash noted. No erythema.  Psychiatric: She has a normal mood and affect. Her behavior is normal.    BP 132/78   Pulse 74   Temp 98.6 F (37 C) (Oral)   Resp 12   Ht 5\' 4"  (1.626 m)   Wt 220 lb 12.8 oz (100.2 kg)   LMP 04/29/1997   SpO2 95%   BMI 37.90 kg/m  Wt Readings from Last 3 Encounters:  05/23/16 220 lb 12.8 oz (100.2 kg)  03/07/16 224 lb (101.6 kg)  12/21/15 220 lb 6.4 oz (100 kg)     Lab Results  Component Value Date   WBC 8.6 05/23/2016   HGB 14.0 05/23/2016   HCT 42.0 05/23/2016   PLT 291.0 05/23/2016   GLUCOSE 92 05/23/2016   CHOL 189 05/23/2016   TRIG 168.0 (H) 05/23/2016   HDL 55.40 05/23/2016   LDLDIRECT 166.0 03/18/2015   LDLCALC 100 (H) 05/23/2016   ALT 13 05/23/2016   AST 18 05/23/2016   NA 141 05/23/2016   K 4.4 05/23/2016   CL 104 05/23/2016   CREATININE 0.97 05/23/2016   BUN 11 05/23/2016   CO2 29 05/23/2016   TSH 2.03 05/23/2016   INR 2.5 (H) 05/23/2016   HGBA1C 6.0 05/23/2016    Mr Hip Left Wo Contrast  Result Date: 04/25/2016 CLINICAL DATA:  Chronic left hip pain. EXAM: MR OF THE LEFT HIP WITHOUT CONTRAST TECHNIQUE: Multiplanar, multisequence MR imaging was performed. No intravenous contrast was administered. COMPARISON:  Radiographs 03/07/2016 and CT scan 02/23/2013 FINDINGS: Bones: Both hips are normally located. Moderate to advanced degenerative changes bilaterally, left greater than right with joint space narrowing, osteophytic spurring and  subchondral cystic change. No stress fracture or AVN. Small bilateral hip joint effusions but no periarticular fluid collections to suggest a paralabral cyst. The pubic symphysis and SI joints are intact. Mild degenerative changes. No pelvic stress fracture or bone lesion. Articular cartilage and labrum Articular cartilage: Significant degenerative chondrosis bilaterally, left greater than right. Labrum:  Degenerated. Joint or bursal effusion Joint effusion:  Small bilateral joint effusions. Bursae:  No findings for trochanteric bursitis. Muscles and tendons Muscles and tendons: Bilateral peritendinosis. The hip and pelvic musculature is intact. No muscle tear or myositis. Moderate fatty  change involving the gluteal muscles and the tensor fascia lata. Other findings Miscellaneous: No significant intrapelvic abnormalities are identified. The uterus and ovaries appear normal. No pelvic mass or free pelvic fluid collections. No inguinal mass or inguinal hernia. IMPRESSION: 1. Significant bilateral hip joint degenerative changes, left greater than right. 2. No stress fracture or AVN. 3. Bilateral peritendinosis but no findings for trochanteric bursitis. 4. No significant intrapelvic abnormalities. Electronically Signed   By: Marijo Sanes M.D.   On: 04/25/2016 09:49       Assessment & Plan:   Problem List Items Addressed This Visit    Essential hypertension, benign    Blood pressure under good control.  Recheck improved today.  Continue same medication regimen.  Follow pressures.  Follow metabolic panel.         Relevant Orders   CBC with Differential/Platelet (Completed)   TSH (Completed)   Basic metabolic panel (Completed)   History of blood clots    On coumadin.  Follow pt/inr.        Hypercholesterolemia    On crestor.  Low cholesterol diet and exercise.  Follow lipid panel and liver function tests.        Relevant Orders   Hepatic function panel (Completed)   Lipid panel (Completed)   Left  hip pain    Doing better.  Saw ortho.  MRI as outlined.  Desires no further intervention.  Follow.        Long term current use of anticoagulant therapy    Check pt/inr today.        Relevant Orders   Protime-INR (Completed)   Stress    Doing well on citalopram.  Follow.         Other Visit Diagnoses    Estrogen deficiency    -  Primary   Relevant Orders   DG Bone Density   Hyperglycemia       Relevant Orders   Hemoglobin A1c (Completed)   Current use of long term anticoagulation       Need for pneumococcal vaccination           Einar Pheasant, MD

## 2016-05-24 LAB — CBC WITH DIFFERENTIAL/PLATELET
BASOS ABS: 0.1 10*3/uL (ref 0.0–0.1)
Basophils Relative: 1.3 % (ref 0.0–3.0)
EOS PCT: 3 % (ref 0.0–5.0)
Eosinophils Absolute: 0.3 10*3/uL (ref 0.0–0.7)
HCT: 42 % (ref 36.0–46.0)
Hemoglobin: 14 g/dL (ref 12.0–15.0)
LYMPHS ABS: 2.7 10*3/uL (ref 0.7–4.0)
Lymphocytes Relative: 31.2 % (ref 12.0–46.0)
MCHC: 33.5 g/dL (ref 30.0–36.0)
MCV: 87.5 fl (ref 78.0–100.0)
MONO ABS: 0.5 10*3/uL (ref 0.1–1.0)
MONOS PCT: 5.4 % (ref 3.0–12.0)
NEUTROS PCT: 59.1 % (ref 43.0–77.0)
Neutro Abs: 5.1 10*3/uL (ref 1.4–7.7)
Platelets: 291 10*3/uL (ref 150.0–400.0)
RBC: 4.8 Mil/uL (ref 3.87–5.11)
RDW: 12.7 % (ref 11.5–15.5)
WBC: 8.6 10*3/uL (ref 4.0–10.5)

## 2016-05-24 LAB — HEPATIC FUNCTION PANEL
ALK PHOS: 65 U/L (ref 39–117)
ALT: 13 U/L (ref 0–35)
AST: 18 U/L (ref 0–37)
Albumin: 4.3 g/dL (ref 3.5–5.2)
BILIRUBIN DIRECT: 0.1 mg/dL (ref 0.0–0.3)
Total Bilirubin: 0.9 mg/dL (ref 0.2–1.2)
Total Protein: 7 g/dL (ref 6.0–8.3)

## 2016-05-24 LAB — BASIC METABOLIC PANEL
BUN: 11 mg/dL (ref 6–23)
CHLORIDE: 104 meq/L (ref 96–112)
CO2: 29 meq/L (ref 19–32)
Calcium: 9.5 mg/dL (ref 8.4–10.5)
Creatinine, Ser: 0.97 mg/dL (ref 0.40–1.20)
GFR: 59.89 mL/min — ABNORMAL LOW (ref >60.00)
GLUCOSE: 92 mg/dL (ref 70–99)
POTASSIUM: 4.4 meq/L (ref 3.5–5.1)
Sodium: 141 mEq/L (ref 135–145)

## 2016-05-24 LAB — PROTIME-INR
INR: 2.5 — ABNORMAL HIGH
Prothrombin Time: 25.3 s — ABNORMAL HIGH (ref 9.0–11.5)

## 2016-05-24 LAB — LIPID PANEL
CHOLESTEROL: 189 mg/dL (ref 0–200)
HDL: 55.4 mg/dL (ref >39.00)
LDL CALC: 100 mg/dL — AB (ref 0–99)
NonHDL: 133.94
TRIGLYCERIDES: 168 mg/dL — AB (ref 0.0–149.0)
Total CHOL/HDL Ratio: 3
VLDL: 33.6 mg/dL (ref 0.0–40.0)

## 2016-05-24 LAB — HEMOGLOBIN A1C: HEMOGLOBIN A1C: 6 % (ref 4.6–6.5)

## 2016-05-24 LAB — TSH: TSH: 2.03 u[IU]/mL (ref 0.35–4.50)

## 2016-05-29 ENCOUNTER — Encounter: Payer: Self-pay | Admitting: Internal Medicine

## 2016-05-29 NOTE — Assessment & Plan Note (Signed)
Blood pressure under good control.  Recheck improved today.  Continue same medication regimen.  Follow pressures.  Follow metabolic panel.

## 2016-05-29 NOTE — Assessment & Plan Note (Signed)
On crestor.  Low cholesterol diet and exercise.  Follow lipid panel and liver function tests.   

## 2016-05-29 NOTE — Assessment & Plan Note (Signed)
On coumadin.  Follow pt/inr.  

## 2016-05-29 NOTE — Assessment & Plan Note (Signed)
Doing better.  Saw ortho.  MRI as outlined.  Desires no further intervention.  Follow.

## 2016-05-29 NOTE — Assessment & Plan Note (Signed)
Check pt/inr today.  

## 2016-05-29 NOTE — Assessment & Plan Note (Signed)
Doing well on citalopram.  Follow.   

## 2016-06-02 NOTE — Telephone Encounter (Signed)
Left pt message asking to call Allison back directly at [phone] to schedule AWV. Thanks! °

## 2016-06-20 ENCOUNTER — Other Ambulatory Visit (INDEPENDENT_AMBULATORY_CARE_PROVIDER_SITE_OTHER): Payer: Medicare Other

## 2016-06-20 DIAGNOSIS — Z7901 Long term (current) use of anticoagulants: Secondary | ICD-10-CM | POA: Diagnosis not present

## 2016-06-20 LAB — PROTIME-INR
INR: 3.1 ratio — AB (ref 0.8–1.0)
Prothrombin Time: 33.5 s — ABNORMAL HIGH (ref 9.6–13.1)

## 2016-06-27 ENCOUNTER — Other Ambulatory Visit (INDEPENDENT_AMBULATORY_CARE_PROVIDER_SITE_OTHER): Payer: Medicare Other

## 2016-06-27 DIAGNOSIS — Z7901 Long term (current) use of anticoagulants: Secondary | ICD-10-CM | POA: Diagnosis not present

## 2016-06-27 LAB — PROTIME-INR
INR: 2.8 ratio — ABNORMAL HIGH (ref 0.8–1.0)
Prothrombin Time: 29.5 s — ABNORMAL HIGH (ref 9.6–13.1)

## 2016-06-28 ENCOUNTER — Encounter: Payer: Self-pay | Admitting: *Deleted

## 2016-07-08 ENCOUNTER — Other Ambulatory Visit: Payer: Self-pay | Admitting: Internal Medicine

## 2016-07-11 ENCOUNTER — Ambulatory Visit
Admission: RE | Admit: 2016-07-11 | Discharge: 2016-07-11 | Disposition: A | Discharge status: Home / Self Care | Payer: Medicare Other | Source: Ambulatory Visit | Attending: Internal Medicine | Admitting: Internal Medicine

## 2016-07-11 DIAGNOSIS — E2839 Other primary ovarian failure: Secondary | ICD-10-CM | POA: Diagnosis present

## 2016-07-11 DIAGNOSIS — Z780 Asymptomatic menopausal state: Secondary | ICD-10-CM | POA: Diagnosis not present

## 2016-07-12 ENCOUNTER — Other Ambulatory Visit: Payer: Medicare Other

## 2016-07-13 NOTE — Telephone Encounter (Signed)
Pt missed her lab appt on 07/12/16 for her PT/INR. Has not been rescheduled.

## 2016-07-13 NOTE — Telephone Encounter (Signed)
Need to reschedule  

## 2016-07-17 NOTE — Telephone Encounter (Signed)
Patient informed she is taking 5mg  on Tuesday and Saturday. With 2.5 mg all other days. I have made lab app for in the am to have INR done

## 2016-07-18 ENCOUNTER — Other Ambulatory Visit (INDEPENDENT_AMBULATORY_CARE_PROVIDER_SITE_OTHER): Payer: Medicare Other

## 2016-07-18 DIAGNOSIS — Z7901 Long term (current) use of anticoagulants: Secondary | ICD-10-CM

## 2016-07-18 LAB — PROTIME-INR
INR: 2 ratio — ABNORMAL HIGH (ref 0.8–1.0)
PROTHROMBIN TIME: 21.7 s — AB (ref 9.6–13.1)

## 2016-07-19 ENCOUNTER — Telehealth: Payer: Self-pay

## 2016-07-19 DIAGNOSIS — Z7901 Long term (current) use of anticoagulants: Secondary | ICD-10-CM

## 2016-07-19 NOTE — Telephone Encounter (Signed)
PT/INR orders placed patient will come in on 08/01/16 for Labs

## 2016-08-01 ENCOUNTER — Other Ambulatory Visit (INDEPENDENT_AMBULATORY_CARE_PROVIDER_SITE_OTHER): Payer: Medicare Other

## 2016-08-01 DIAGNOSIS — Z7901 Long term (current) use of anticoagulants: Secondary | ICD-10-CM

## 2016-08-01 LAB — PROTIME-INR
INR: 3.5 ratio — ABNORMAL HIGH (ref 0.8–1.0)
PROTHROMBIN TIME: 37.3 s — AB (ref 9.6–13.1)

## 2016-08-03 ENCOUNTER — Other Ambulatory Visit: Payer: Medicare Other

## 2016-08-03 ENCOUNTER — Other Ambulatory Visit: Payer: Self-pay | Admitting: Radiology

## 2016-08-03 DIAGNOSIS — Z5181 Encounter for therapeutic drug level monitoring: Secondary | ICD-10-CM

## 2016-08-03 DIAGNOSIS — Z7901 Long term (current) use of anticoagulants: Principal | ICD-10-CM

## 2016-08-03 DIAGNOSIS — R609 Edema, unspecified: Secondary | ICD-10-CM | POA: Diagnosis not present

## 2016-08-04 ENCOUNTER — Other Ambulatory Visit: Payer: Medicare Other

## 2016-09-04 ENCOUNTER — Other Ambulatory Visit: Payer: Self-pay | Admitting: Internal Medicine

## 2016-09-12 ENCOUNTER — Encounter: Payer: Self-pay | Admitting: Internal Medicine

## 2016-09-12 ENCOUNTER — Other Ambulatory Visit: Payer: Self-pay | Admitting: Internal Medicine

## 2016-09-12 ENCOUNTER — Ambulatory Visit (INDEPENDENT_AMBULATORY_CARE_PROVIDER_SITE_OTHER): Payer: Medicare Other | Admitting: Internal Medicine

## 2016-09-12 VITALS — BP 138/82 | HR 77 | Temp 98.2°F | Resp 12 | Ht 64.0 in | Wt 231.2 lb

## 2016-09-12 DIAGNOSIS — R739 Hyperglycemia, unspecified: Secondary | ICD-10-CM | POA: Diagnosis not present

## 2016-09-12 DIAGNOSIS — Z7901 Long term (current) use of anticoagulants: Secondary | ICD-10-CM | POA: Diagnosis not present

## 2016-09-12 DIAGNOSIS — F439 Reaction to severe stress, unspecified: Secondary | ICD-10-CM | POA: Diagnosis not present

## 2016-09-12 DIAGNOSIS — I10 Essential (primary) hypertension: Secondary | ICD-10-CM

## 2016-09-12 DIAGNOSIS — E7800 Pure hypercholesterolemia, unspecified: Secondary | ICD-10-CM | POA: Diagnosis not present

## 2016-09-12 DIAGNOSIS — J4520 Mild intermittent asthma, uncomplicated: Secondary | ICD-10-CM | POA: Diagnosis not present

## 2016-09-12 LAB — HEPATIC FUNCTION PANEL
ALBUMIN: 4.1 g/dL (ref 3.5–5.2)
ALK PHOS: 54 U/L (ref 39–117)
ALT: 15 U/L (ref 0–35)
AST: 18 U/L (ref 0–37)
BILIRUBIN DIRECT: 0.2 mg/dL (ref 0.0–0.3)
TOTAL PROTEIN: 6.6 g/dL (ref 6.0–8.3)
Total Bilirubin: 0.6 mg/dL (ref 0.2–1.2)

## 2016-09-12 LAB — BASIC METABOLIC PANEL
BUN: 16 mg/dL (ref 6–23)
CALCIUM: 8.6 mg/dL (ref 8.4–10.5)
CO2: 31 mEq/L (ref 19–32)
Chloride: 105 mEq/L (ref 96–112)
Creatinine, Ser: 0.98 mg/dL (ref 0.40–1.20)
GFR: 59.14 mL/min — AB (ref >60.00)
GLUCOSE: 94 mg/dL (ref 70–99)
POTASSIUM: 4.6 meq/L (ref 3.5–5.1)
SODIUM: 140 meq/L (ref 135–145)

## 2016-09-12 LAB — LIPID PANEL
CHOL/HDL RATIO: 3
Cholesterol: 153 mg/dL (ref 0–200)
HDL: 52.3 mg/dL (ref >39.00)
LDL Cholesterol: 70 mg/dL (ref 0–99)
NONHDL: 100.31
TRIGLYCERIDES: 151 mg/dL — AB (ref 0.0–149.0)
VLDL: 30.2 mg/dL (ref 0.0–40.0)

## 2016-09-12 LAB — PROTIME-INR
INR: 3.1 ratio — AB (ref 0.8–1.0)
Prothrombin Time: 33 s — ABNORMAL HIGH (ref 9.6–13.1)

## 2016-09-12 LAB — HEMOGLOBIN A1C: HEMOGLOBIN A1C: 6.1 % (ref 4.6–6.5)

## 2016-09-12 NOTE — Progress Notes (Signed)
Order placed for f/u pt/inr

## 2016-09-12 NOTE — Progress Notes (Signed)
Patient ID: Kynzee Devinney Artiga, female   DOB: 12/27/1945, 71 y.o.   MRN: [id number]   Subjective:    Patient ID: Tawny Asal Azbill, female    DOB: 1945-11-11, 71 y.o.   MRN: [id number]  HPI  Patient here for a scheduled follow up.  She reports she is doing relatively well.  Breathing stable.  No increased cough or congestion.  No chest pain.  She has gained some weight.  Discussed diet and exercise.  No acid reflux.  No abdominal pain.  Bowels moving.  Recent bone density - normal.  Previously saw Dr Sabra Heck.  S/p injection.  Hip is better.     Past Medical History:  Diagnosis Date  . Allergy   . Asthma   . Chicken pox   . History of blood clots   . Hypercholesterolemia   . Hypertension    Past Surgical History:  Procedure Laterality Date  . blood clots  2008   Family History  Problem Relation Age of Onset  . Cancer Mother        Breast  . Heart disease Mother   . Stroke Mother   . Hypertension Mother   . Breast cancer Mother        70's  . Heart disease Father   . Stroke Father   . Hypertension Father   . Diabetes Father   . Colon cancer Unknown        paternal cousin   Social History   Social History  . Marital status: Widowed    Spouse name: N/A  . Number of children: N/A  . Years of education: N/A   Social History Main Topics  . Smoking status: Never Smoker  . Smokeless tobacco: Never Used  . Alcohol use No  . Drug use: No  . Sexual activity: Not Asked   Other Topics Concern  . None   Social History Narrative  . None    Outpatient Encounter Prescriptions as of 09/12/2016  Medication Sig  . acetaminophen (TYLENOL) 650 MG CR tablet Take 650 mg by mouth every 8 (eight) hours as needed for pain.  Marland Kitchen albuterol (PROVENTIL HFA) 108 (90 BASE) MCG/ACT inhaler Inhale 2 puffs into the lungs every 6 (six) hours as needed for wheezing.  Marland Kitchen albuterol (PROVENTIL HFA;VENTOLIN HFA) 108 (90 Base) MCG/ACT inhaler Inhale 2 puffs into the lungs every 6 (six) hours as needed for wheezing  or shortness of breath.  . citalopram (CELEXA) 20 MG tablet TAKE ONE (1) TABLET EACH DAY  . clotrimazole (MYCELEX) 10 MG troche Take 1 tablet (10 mg total) by mouth 3 (three) times daily.  . diphenhydrAMINE (BENADRYL) 25 mg capsule Take 25 mg by mouth every 6 (six) hours as needed.  . fluticasone (FLONASE) 50 MCG/ACT nasal spray Place 2 sprays into both nostrils daily.  . fluticasone (FLOVENT HFA) 110 MCG/ACT inhaler Inhale 2 puffs into the lungs 2 (two) times daily.  Marland Kitchen losartan (COZAAR) 50 MG tablet TAKE ONE (1) TABLET EACH DAY  . nystatin (MYCOSTATIN) 100000 UNIT/ML suspension Take 5 mLs (500,000 Units total) by mouth 3 (three) times daily.  . pantoprazole (PROTONIX) 40 MG tablet TAKE ONE (1) TABLET EACH DAY  . predniSONE (DELTASONE) 10 MG tablet Take 6 tablets x 1 day and then decrease by 1/2 tablet per day until down to zero mg.  . rosuvastatin (CRESTOR) 5 MG tablet TAKE ONE (1) TABLET EACH DAY  . warfarin (COUMADIN) 5 MG tablet TAKE ONE (1) TABLET EACH DAY   No  facility-administered encounter medications on file as of 09/12/2016.     Review of Systems  Constitutional: Negative for appetite change.       Weight has increased.    HENT: Negative for congestion and sinus pressure.   Respiratory: Negative for cough and chest tightness.        Breathing stable.   Cardiovascular: Negative for chest pain, palpitations and leg swelling.  Gastrointestinal: Negative for abdominal pain, diarrhea, nausea and vomiting.  Genitourinary: Negative for difficulty urinating and dysuria.  Musculoskeletal: Negative for joint swelling and myalgias.  Skin: Negative for color change and rash.  Neurological: Negative for dizziness, light-headedness and headaches.  Psychiatric/Behavioral: Negative for agitation and dysphoric mood.       Objective:    Physical Exam  Constitutional: She appears well-developed and well-nourished. No distress.  HENT:  Nose: Nose normal.  Mouth/Throat: Oropharynx is clear  and moist.  Neck: Neck supple. No thyromegaly present.  Cardiovascular: Normal rate and regular rhythm.   Pulmonary/Chest: Breath sounds normal. No respiratory distress. She has no wheezes.  Abdominal: Soft. Bowel sounds are normal. There is no tenderness.  Musculoskeletal: She exhibits no edema or tenderness.  Lymphadenopathy:    She has no cervical adenopathy.  Skin: No rash noted. No erythema.  Psychiatric: She has a normal mood and affect. Her behavior is normal.    BP 138/82   Pulse 77   Temp 98.2 F (36.8 C) (Oral)   Resp 12   Ht _0  (1.626 m)   Wt 231 lb 3.2 oz (104.9 kg)   LMP 04/29/1997   SpO2 95%   BMI 39.69 kg/m  Wt Readings from Last 3 Encounters:  09/12/16 231 lb 3.2 oz (104.9 kg)  05/23/16 220 lb 12.8 oz (100.2 kg)  03/07/16 224 lb (101.6 kg)     Lab Results  Component Value Date   WBC 8.6 05/23/2016   HGB 14.0 05/23/2016   HCT 42.0 05/23/2016   PLT 291.0 05/23/2016   GLUCOSE 94 09/12/2016   CHOL 153 09/12/2016   TRIG 151.0 (H) 09/12/2016   HDL 52.30 09/12/2016   LDLDIRECT 166.0 03/18/2015   LDLCALC 70 09/12/2016   ALT 15 09/12/2016   AST 18 09/12/2016   NA 140 09/12/2016   K 4.6 09/12/2016   CL 105 09/12/2016   CREATININE 0.98 09/12/2016   BUN 16 09/12/2016   CO2 31 09/12/2016   TSH 2.03 05/23/2016   INR 3.1 (H) 09/12/2016   HGBA1C 6.1 09/12/2016    Dg Bone Density  Result Date: 07/11/2016 EXAM: DUAL X-RAY ABSORPTIOMETRY (DXA) FOR BONE MINERAL DENSITY IMPRESSION: Dear Dr. Nicki Reaper, Your patient Yee Speranza completed a BMD test on 07/11/2016 using the [place] (analysis version: 14.10) manufactured by [employer]. The following summarizes the results of our evaluation. PATIENT BIOGRAPHICAL: Name: Sheritha, Louis Patient ID: [id number] Birth Date: [birth year] Height: 62.5 in. Gender: Female Exam Date: 07/11/2016 Weight: 222.2 lbs. Indications: asthma, Caucasian, Height Loss, Postmenopausal Fractures: Treatments: warfin ASSESSMENT: The BMD  measured at Femur Neck Left is 0.927 g/cm2 with a T-score of -0.8. This patient is considered normal according to [facility]) criteria. Lumbar spine was not utilized due to advanced degenerative changes. Site Region Measured Measured WHO Young Adult BMD Date       Age      Classification T-score DualFemur Neck Left 07/11/2016 72.7 Normal -0.8 0.927 g/cm2 Left Forearm Radius 33% 07/11/2016 72.7 Normal -0.5 0.830 g/cm2 World Health Organization [facility]) criteria for post-menopausal,  Caucasian Women: Normal:       T-score at or above -1 SD Osteopenia:   T-score between -1 and -2.5 SD Osteoporosis: T-score at or below -2.5 SD RECOMMENDATIONS: [facility] recommends that FDA-approved medical therapies be considered in postmenopausal women and men age 40 or older with a: 1. Hip or vertebral (clinical or morphometric) fracture. 2. T-score of < -2.5 at the spine or hip. 3. Ten-year fracture probability by FRAX of 3% or greater for hip fracture or 20% or greater for major osteoporotic fracture. All treatment decisions require clinical judgment and consideration of individual patient factors, including patient preferences, co-morbidities, previous drug use, risk factors not captured in the FRAX model (e.g. falls, vitamin D deficiency, increased bone turnover, interval significant decline in bone density) and possible under - or over-estimation of fracture risk by FRAX. All patients should ensure an adequate intake of dietary calcium (1200 mg/d) and vitamin D (800 IU daily) unless contraindicated. FOLLOW-UP: People with diagnosed cases of osteoporosis or at high risk for fracture should have regular bone mineral density tests. For patients eligible for Medicare, routine testing is allowed once every 2 years. The testing frequency can be increased to one year for patients who have rapidly progressing disease, those who are receiving or discontinuing medical therapy to restore bone mass, or  have additional risk factors. I have reviewed this report, and agree with the above findings. [facility]iology Electronically Signed   By: Lowella Grip III M.D.   On: 07/11/2016 10:55       Assessment & Plan:   Problem List Items Addressed This Visit    Asthma    Breathing stable.        Essential hypertension, benign    Blood pressure on recheck improved.  Same medication regimen.  Follow pressures.  Follow metabolic panel.        Relevant Orders   Basic metabolic panel (Completed)   Hypercholesterolemia - Primary    On crestor.  Low cholesterol diet and exercise.  Follow lipid panel and liver function tests.        Relevant Orders   Hepatic function panel (Completed)   Lipid panel (Completed)   Hyperglycemia    Low carb diet and exercise.  Follow met b and a1c.       Relevant Orders   Hemoglobin A1c (Completed)   Long term current use of anticoagulant therapy    Check pt/inr today.       Relevant Orders   Protime-INR (Completed)   Stress    Doing well.  Stress is better.  On citalopram.            Einar Pheasant, MD

## 2016-09-12 NOTE — Progress Notes (Signed)
Pre-visit discussion using our clinic review tool. No additional management support is needed unless otherwise documented below in the visit note.  

## 2016-09-13 ENCOUNTER — Encounter: Payer: Self-pay | Admitting: Internal Medicine

## 2016-09-13 NOTE — Assessment & Plan Note (Signed)
Low carb diet and exercise.  Follow met b and a1c.  

## 2016-09-13 NOTE — Assessment & Plan Note (Signed)
On crestor.  Low cholesterol diet and exercise.  Follow lipid panel and liver function tests.   

## 2016-09-13 NOTE — Assessment & Plan Note (Signed)
Doing well.  Stress is better.  On citalopram.

## 2016-09-13 NOTE — Assessment & Plan Note (Signed)
Check pt/inr today.  

## 2016-09-13 NOTE — Assessment & Plan Note (Signed)
Breathing stable.

## 2016-09-13 NOTE — Assessment & Plan Note (Signed)
Blood pressure on recheck improved.  Same medication regimen.  Follow pressures.  Follow metabolic panel.   

## 2016-09-26 ENCOUNTER — Other Ambulatory Visit (INDEPENDENT_AMBULATORY_CARE_PROVIDER_SITE_OTHER): Payer: Medicare Other

## 2016-09-26 DIAGNOSIS — Z7901 Long term (current) use of anticoagulants: Secondary | ICD-10-CM | POA: Diagnosis not present

## 2016-09-26 DIAGNOSIS — Z5181 Encounter for therapeutic drug level monitoring: Secondary | ICD-10-CM | POA: Diagnosis not present

## 2016-09-26 LAB — PROTIME-INR
INR: 2.5 ratio — ABNORMAL HIGH (ref 0.8–1.0)
Prothrombin Time: 27.3 s — ABNORMAL HIGH (ref 9.6–13.1)

## 2016-09-28 IMAGING — CR DG CHEST 2V
1 series · 2 of 2 positions shown · non-contrast
Comparison: 06/08/2014

CLINICAL DATA: Cough and congestion for 1 week, dry barking cough,
shortness of breath after speaking in sentences, feels as if she is
unable to get enough air, history blood clots, asthma, hypertension

EXAM:
CHEST  2 VIEW

[Series 1: dg chest 2 view · 0.14mm/px · 2 of 2 slices shown]
[im 1/2]
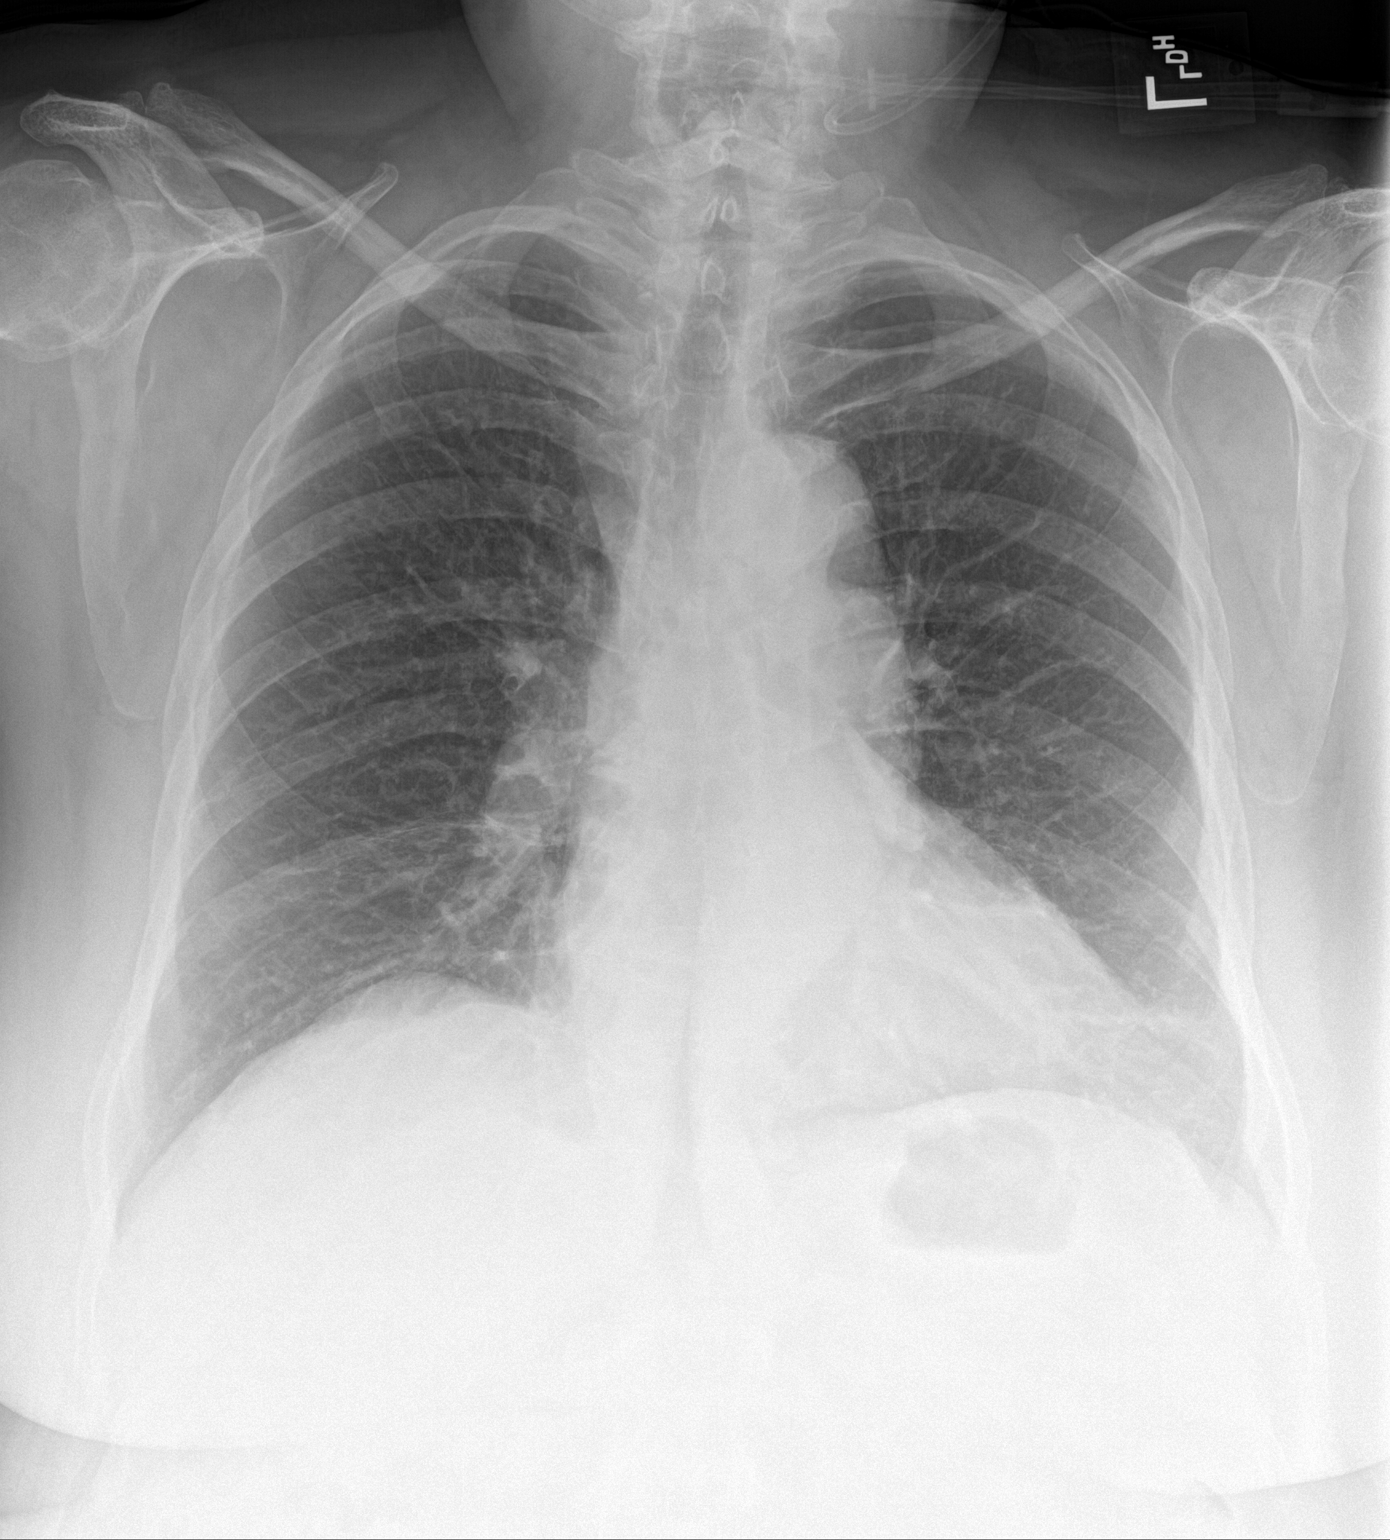
[im 2/2]
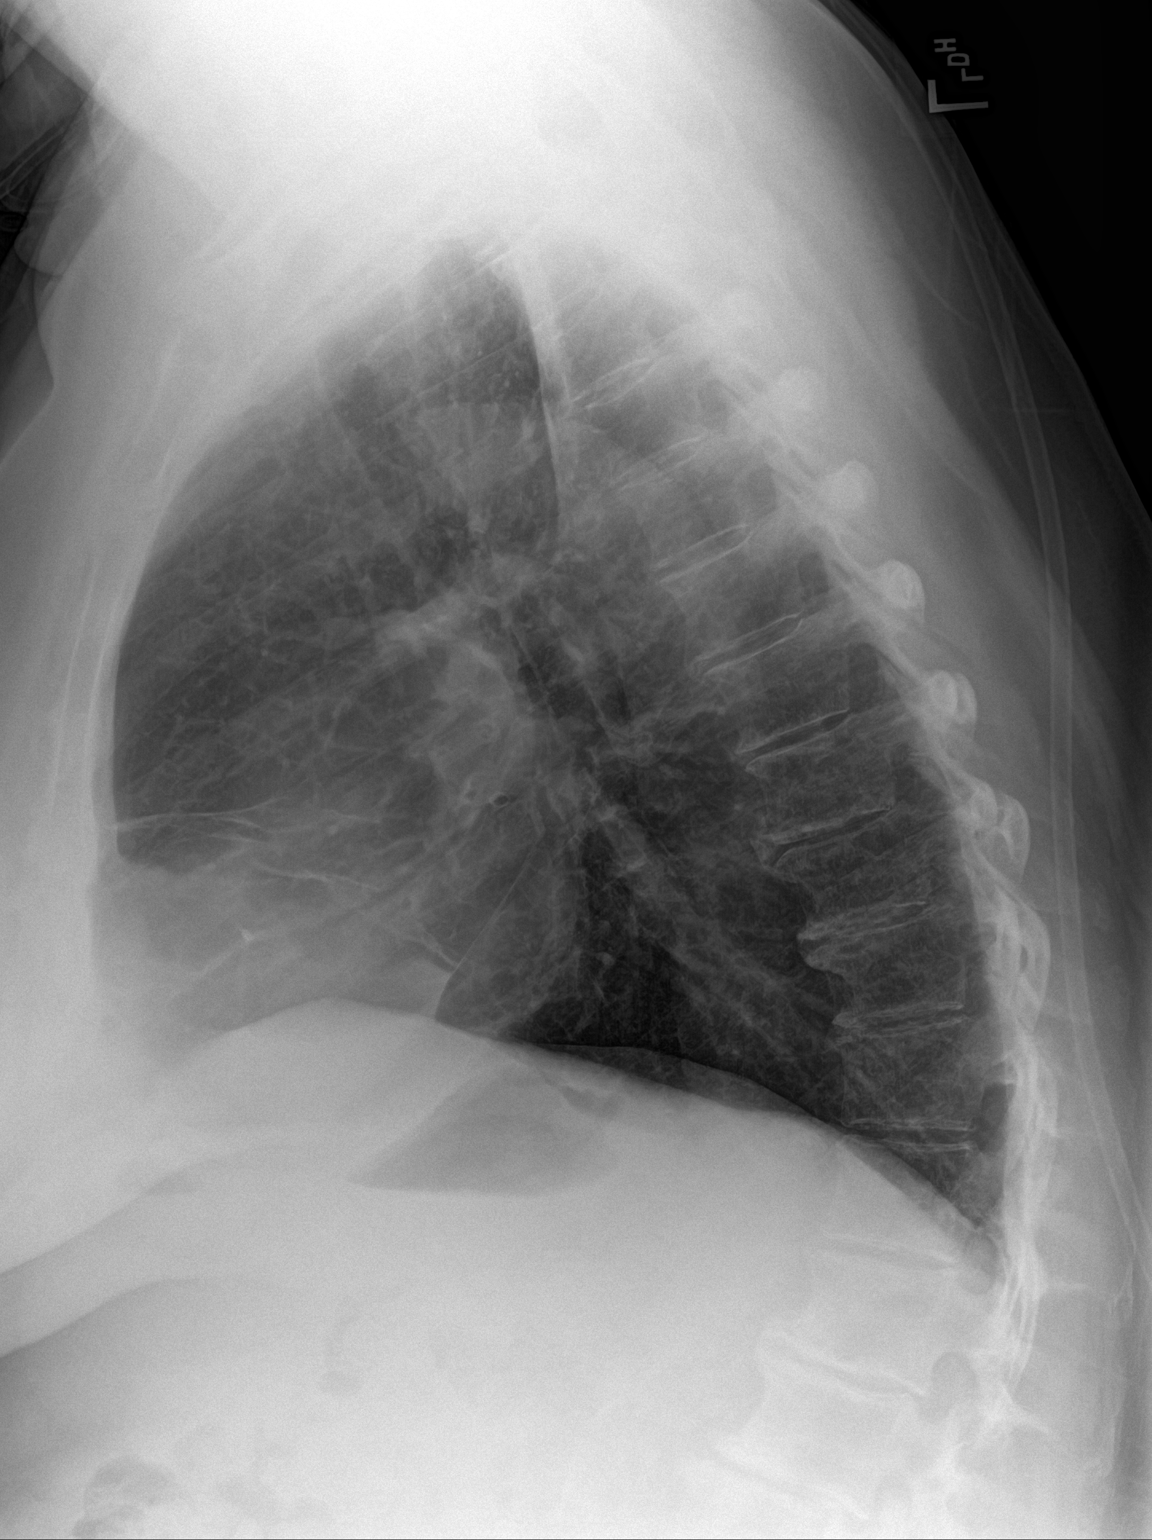

[2 of 2 positions shown; findings below may reference images not displayed]

FINDINGS: Enlargement of cardiac silhouette.

Tortuous aorta.

Mediastinal contours and pulmonary vascularity normal.

Mild chronic peribronchial thickening.

No acute infiltrate, pleural effusion or pneumothorax.

Bones demineralized.
IMPRESSION: Chronic bronchitic changes without acute infiltrate.

## 2016-10-09 ENCOUNTER — Other Ambulatory Visit: Payer: Self-pay

## 2016-10-09 MED ORDER — WARFARIN SODIUM 5 MG PO TABS: ORAL_TABLET | ORAL | 3 refills | Status: DC

## 2016-10-10 ENCOUNTER — Other Ambulatory Visit (INDEPENDENT_AMBULATORY_CARE_PROVIDER_SITE_OTHER): Payer: Medicare Other

## 2016-10-10 DIAGNOSIS — Z7901 Long term (current) use of anticoagulants: Secondary | ICD-10-CM | POA: Diagnosis not present

## 2016-10-10 LAB — PROTIME-INR
INR: 2.2 ratio — ABNORMAL HIGH (ref 0.8–1.0)
Prothrombin Time: 23.2 s — ABNORMAL HIGH (ref 9.6–13.1)

## 2016-10-19 ENCOUNTER — Telehealth: Payer: Self-pay | Admitting: Radiology

## 2016-10-19 NOTE — Telephone Encounter (Signed)
Pt coming in for labs next Tuesday, please place future orders. Thank you.  

## 2016-10-20 ENCOUNTER — Other Ambulatory Visit: Payer: Self-pay | Admitting: Internal Medicine

## 2016-10-20 DIAGNOSIS — Z7901 Long term (current) use of anticoagulants: Secondary | ICD-10-CM

## 2016-10-20 NOTE — Progress Notes (Signed)
Order placed for f/u pt/inr 

## 2016-10-24 ENCOUNTER — Other Ambulatory Visit (INDEPENDENT_AMBULATORY_CARE_PROVIDER_SITE_OTHER): Payer: Medicare Other

## 2016-10-24 DIAGNOSIS — Z7901 Long term (current) use of anticoagulants: Secondary | ICD-10-CM

## 2016-10-24 DIAGNOSIS — Z5181 Encounter for therapeutic drug level monitoring: Secondary | ICD-10-CM

## 2016-10-24 LAB — PROTIME-INR
INR: 1.4 ratio — ABNORMAL HIGH (ref 0.8–1.0)
Prothrombin Time: 15 s — ABNORMAL HIGH (ref 9.6–13.1)

## 2016-11-02 ENCOUNTER — Other Ambulatory Visit: Payer: Self-pay | Admitting: Radiology

## 2016-11-02 DIAGNOSIS — Z5181 Encounter for therapeutic drug level monitoring: Secondary | ICD-10-CM

## 2016-11-02 DIAGNOSIS — Z7901 Long term (current) use of anticoagulants: Principal | ICD-10-CM

## 2016-11-03 ENCOUNTER — Telehealth: Payer: Self-pay | Admitting: *Deleted

## 2016-11-03 ENCOUNTER — Other Ambulatory Visit (INDEPENDENT_AMBULATORY_CARE_PROVIDER_SITE_OTHER): Payer: Medicare Other

## 2016-11-03 DIAGNOSIS — Z5181 Encounter for therapeutic drug level monitoring: Secondary | ICD-10-CM

## 2016-11-03 DIAGNOSIS — Z7901 Long term (current) use of anticoagulants: Secondary | ICD-10-CM | POA: Diagnosis not present

## 2016-11-03 LAB — PROTIME-INR
INR: 3.5 ratio — AB (ref 0.8–1.0)
PROTHROMBIN TIME: 37.3 s — AB (ref 9.6–13.1)

## 2016-11-03 NOTE — Telephone Encounter (Signed)
Tina Duarte came in for PT/INR this morning & wanted to let you know that she took 5mg  of Coumadin last night instead of 2.5mg .

## 2016-11-03 NOTE — Telephone Encounter (Signed)
Noted. Will await results.

## 2016-11-06 ENCOUNTER — Other Ambulatory Visit: Payer: Self-pay | Admitting: Radiology

## 2016-11-06 DIAGNOSIS — Z7901 Long term (current) use of anticoagulants: Principal | ICD-10-CM

## 2016-11-06 DIAGNOSIS — Z5181 Encounter for therapeutic drug level monitoring: Secondary | ICD-10-CM

## 2016-11-07 ENCOUNTER — Other Ambulatory Visit (INDEPENDENT_AMBULATORY_CARE_PROVIDER_SITE_OTHER): Payer: Medicare Other

## 2016-11-07 DIAGNOSIS — Z7901 Long term (current) use of anticoagulants: Secondary | ICD-10-CM | POA: Diagnosis not present

## 2016-11-07 DIAGNOSIS — Z5181 Encounter for therapeutic drug level monitoring: Secondary | ICD-10-CM | POA: Diagnosis not present

## 2016-11-07 LAB — PROTIME-INR
INR: 3.5 ratio — ABNORMAL HIGH (ref 0.8–1.0)
Prothrombin Time: 36.9 s — ABNORMAL HIGH (ref 9.6–13.1)

## 2016-11-15 ENCOUNTER — Telehealth: Payer: Self-pay | Admitting: *Deleted

## 2016-11-15 DIAGNOSIS — Z7901 Long term (current) use of anticoagulants: Secondary | ICD-10-CM

## 2016-11-15 NOTE — Telephone Encounter (Signed)
Standing PT/INR order placed

## 2016-11-17 ENCOUNTER — Other Ambulatory Visit (INDEPENDENT_AMBULATORY_CARE_PROVIDER_SITE_OTHER): Payer: Medicare Other

## 2016-11-17 DIAGNOSIS — Z7901 Long term (current) use of anticoagulants: Secondary | ICD-10-CM | POA: Diagnosis not present

## 2016-11-17 LAB — PROTIME-INR
INR: 2.3 ratio — AB (ref 0.8–1.0)
Prothrombin Time: 24.6 s — ABNORMAL HIGH (ref 9.6–13.1)

## 2016-12-01 ENCOUNTER — Other Ambulatory Visit (INDEPENDENT_AMBULATORY_CARE_PROVIDER_SITE_OTHER): Payer: Medicare Other

## 2016-12-01 DIAGNOSIS — Z7901 Long term (current) use of anticoagulants: Secondary | ICD-10-CM

## 2016-12-01 LAB — PROTIME-INR
INR: 2.9 ratio — AB (ref 0.8–1.0)
PROTHROMBIN TIME: 30.7 s — AB (ref 9.6–13.1)

## 2016-12-07 ENCOUNTER — Other Ambulatory Visit: Payer: Self-pay | Admitting: Internal Medicine

## 2016-12-07 ENCOUNTER — Other Ambulatory Visit: Payer: Self-pay

## 2016-12-07 ENCOUNTER — Telehealth: Payer: Self-pay | Admitting: Internal Medicine

## 2016-12-07 MED ORDER — LOSARTAN POTASSIUM 50 MG PO TABS: ORAL_TABLET | ORAL | 0 refills | Status: DC

## 2016-12-07 NOTE — Telephone Encounter (Signed)
Copied from [place] #[id number]. Topic: Inquiry >> Dec 07, 2016  2:49 PM Pricilla Handler wrote: Reason for CRM: Patient called requesting Medication refills for the following medications: citalopram (CELEXA) 20 MG tablet, rosuvastatin (CRESTOR) 5 MG tablet, losartan (COZAAR) 50 MG tablet. Please call patient ASAP. Thank You!!!

## 2016-12-07 NOTE — Telephone Encounter (Signed)
Contacted pt regarding requested refills and pt says she has already picked up prescriptions.

## 2016-12-11 ENCOUNTER — Other Ambulatory Visit: Payer: Medicare Other

## 2016-12-19 ENCOUNTER — Other Ambulatory Visit: Payer: Self-pay

## 2016-12-19 ENCOUNTER — Ambulatory Visit (INDEPENDENT_AMBULATORY_CARE_PROVIDER_SITE_OTHER): Payer: Medicare Other | Admitting: Internal Medicine

## 2016-12-19 ENCOUNTER — Encounter: Payer: Self-pay | Admitting: Internal Medicine

## 2016-12-19 VITALS — BP 136/84 | HR 81 | Temp 98.6°F | Resp 14 | Wt 230.2 lb

## 2016-12-19 DIAGNOSIS — R739 Hyperglycemia, unspecified: Secondary | ICD-10-CM

## 2016-12-19 DIAGNOSIS — Z7901 Long term (current) use of anticoagulants: Secondary | ICD-10-CM

## 2016-12-19 DIAGNOSIS — Z86718 Personal history of other venous thrombosis and embolism: Secondary | ICD-10-CM

## 2016-12-19 DIAGNOSIS — Z5181 Encounter for therapeutic drug level monitoring: Secondary | ICD-10-CM | POA: Diagnosis not present

## 2016-12-19 DIAGNOSIS — F439 Reaction to severe stress, unspecified: Secondary | ICD-10-CM

## 2016-12-19 DIAGNOSIS — J4520 Mild intermittent asthma, uncomplicated: Secondary | ICD-10-CM | POA: Diagnosis not present

## 2016-12-19 DIAGNOSIS — R197 Diarrhea, unspecified: Secondary | ICD-10-CM

## 2016-12-19 DIAGNOSIS — E7800 Pure hypercholesterolemia, unspecified: Secondary | ICD-10-CM

## 2016-12-19 DIAGNOSIS — I10 Essential (primary) hypertension: Secondary | ICD-10-CM | POA: Diagnosis not present

## 2016-12-19 LAB — LIPID PANEL
CHOLESTEROL: 159 mg/dL (ref 0–200)
HDL: 46.8 mg/dL (ref >39.00)
LDL CALC: 90 mg/dL (ref 0–99)
NonHDL: 112.14
TRIGLYCERIDES: 112 mg/dL (ref 0.0–149.0)
Total CHOL/HDL Ratio: 3
VLDL: 22.4 mg/dL (ref 0.0–40.0)

## 2016-12-19 LAB — BASIC METABOLIC PANEL
BUN: 13 mg/dL (ref 6–23)
CALCIUM: 9.6 mg/dL (ref 8.4–10.5)
CO2: 30 meq/L (ref 19–32)
CREATININE: 1.02 mg/dL (ref 0.40–1.20)
Chloride: 102 mEq/L (ref 96–112)
GFR: 56.43 mL/min — AB (ref >60.00)
GLUCOSE: 93 mg/dL (ref 70–99)
Potassium: 4.5 mEq/L (ref 3.5–5.1)
SODIUM: 138 meq/L (ref 135–145)

## 2016-12-19 LAB — HEPATIC FUNCTION PANEL
ALT: 15 U/L (ref 0–35)
AST: 20 U/L (ref 0–37)
Albumin: 4.1 g/dL (ref 3.5–5.2)
Alkaline Phosphatase: 53 U/L (ref 39–117)
BILIRUBIN DIRECT: 0.1 mg/dL (ref 0.0–0.3)
TOTAL PROTEIN: 7.1 g/dL (ref 6.0–8.3)
Total Bilirubin: 1 mg/dL (ref 0.2–1.2)

## 2016-12-19 LAB — HEMOGLOBIN A1C: Hgb A1c MFr Bld: 5.9 % (ref 4.6–6.5)

## 2016-12-19 LAB — PROTIME-INR
INR: 3.2 ratio — ABNORMAL HIGH (ref 0.8–1.0)
Prothrombin Time: 34.5 s — ABNORMAL HIGH (ref 9.6–13.1)

## 2016-12-19 NOTE — Patient Instructions (Signed)
Saline nasal spray - flush nose at least 2-3x/day  nasacort nasal spray - 2 sprays each nostril one time per day.  Do this in the evening.   Robitussin - twice a day as needed.   

## 2016-12-19 NOTE — Progress Notes (Signed)
Patient ID: WAJIHA VERSTEEG, female   DOB: 03-10-1945, 71 y.o.   MRN: [id number]   Subjective:    Patient ID: Tawny Asal Beets, female    DOB: 1945-08-15, 71 y.o.   MRN: [id number]  HPI  Patient here for a scheduled follow up.   She reports she has been doing relatively well.  Stress is better.  No chest pain.  Noticed starting in the last 24 hours, some stinging and burning in her nose and back of her throat.  Minimal symptoms currently.  No sinus pressure.  No fever.  No chest congestion, chest tightness or cough.  Discussed using flonase.  No abdominal pain.  Intermittent flares with diarrhea.  Nothing that is persistent.  Discussed monitoring for food triggers or other triggers.  No urinary change.  Overall she feels she is doing well.     Past Medical History:  Diagnosis Date  . Allergy   . Asthma   . Chicken pox   . History of blood clots   . Hypercholesterolemia   . Hypertension    Past Surgical History:  Procedure Laterality Date  . blood clots  2008   Family History  Problem Relation Age of Onset  . Cancer Mother        Breast  . Heart disease Mother   . Stroke Mother   . Hypertension Mother   . Breast cancer Mother        95's  . Heart disease Father   . Stroke Father   . Hypertension Father   . Diabetes Father   . Colon cancer Unknown        paternal cousin   Social History   Socioeconomic History  . Marital status: Widowed    Spouse name: None  . Number of children: None  . Years of education: None  . Highest education level: None  Social Needs  . Financial resource strain: None  . Food insecurity - worry: None  . Food insecurity - inability: None  . Transportation needs - medical: None  . Transportation needs - non-medical: None  Occupational History  . None  Tobacco Use  . Smoking status: Never Smoker  . Smokeless tobacco: Never Used  Substance and Sexual Activity  . Alcohol use: No    Alcohol/week: 0.0 oz  . Drug use: No  . Sexual activity: None    Other Topics Concern  . None  Social History Narrative  . None    Outpatient Encounter Medications as of 12/19/2016  Medication Sig  . acetaminophen (TYLENOL) 650 MG CR tablet Take 650 mg by mouth every 8 (eight) hours as needed for pain.  Marland Kitchen albuterol (PROVENTIL HFA) 108 (90 BASE) MCG/ACT inhaler Inhale 2 puffs into the lungs every 6 (six) hours as needed for wheezing.  Marland Kitchen albuterol (PROVENTIL HFA;VENTOLIN HFA) 108 (90 Base) MCG/ACT inhaler Inhale 2 puffs into the lungs every 6 (six) hours as needed for wheezing or shortness of breath.  . citalopram (CELEXA) 20 MG tablet TAKE ONE (1) TABLET EACH DAY  . clotrimazole (MYCELEX) 10 MG troche Take 1 tablet (10 mg total) by mouth 3 (three) times daily.  . diphenhydrAMINE (BENADRYL) 25 mg capsule Take 25 mg by mouth every 6 (six) hours as needed.  . fluticasone (FLONASE) 50 MCG/ACT nasal spray Place 2 sprays into both nostrils daily.  . fluticasone (FLOVENT HFA) 110 MCG/ACT inhaler Inhale 2 puffs into the lungs 2 (two) times daily.  Marland Kitchen losartan (COZAAR) 50 MG tablet TAKE ONE (1)  TABLET EACH DAY  . nystatin (MYCOSTATIN) 100000 UNIT/ML suspension Take 5 mLs (500,000 Units total) by mouth 3 (three) times daily.  . pantoprazole (PROTONIX) 40 MG tablet TAKE ONE (1) TABLET EACH DAY  . predniSONE (DELTASONE) 10 MG tablet Take 6 tablets x 1 day and then decrease by 1/2 tablet per day until down to zero mg.  . rosuvastatin (CRESTOR) 5 MG tablet TAKE ONE (1) TABLET EACH DAY  . warfarin (COUMADIN) 5 MG tablet TAKE ONE (1) TABLET EACH DAY   No facility-administered encounter medications on file as of 12/19/2016.     Review of Systems  Constitutional: Negative for appetite change and unexpected weight change.  HENT: Positive for congestion and postnasal drip.   Respiratory: Negative for cough, chest tightness and shortness of breath.   Cardiovascular: Negative for chest pain and palpitations.       No increased leg swelling.  Stable.   Gastrointestinal:  Negative for abdominal pain, nausea and vomiting.       Intermittent diarrhea.    Genitourinary: Negative for difficulty urinating and dysuria.  Musculoskeletal: Negative for joint swelling and myalgias.  Skin: Negative for color change and rash.  Neurological: Negative for dizziness, light-headedness and headaches.  Psychiatric/Behavioral: Negative for agitation and dysphoric mood.       Objective:    Physical Exam  Constitutional: She appears well-developed and well-nourished. No distress.  HENT:  Nose: Nose normal.  Mouth/Throat: Oropharynx is clear and moist.  Neck: Neck supple. No thyromegaly present.  Cardiovascular: Normal rate and regular rhythm.  Pulmonary/Chest: Breath sounds normal. No respiratory distress. She has no wheezes.  Abdominal: Soft. Bowel sounds are normal. There is no tenderness.  Musculoskeletal: She exhibits no edema or tenderness.  Lymphadenopathy:    She has no cervical adenopathy.  Skin: No rash noted. No erythema.  Psychiatric: She has a normal mood and affect. Her behavior is normal.    BP 136/84 (BP Location: Left Arm, Patient Position: Sitting, Cuff Size: Normal)   Pulse 81   Temp 98.6 F (37 C) (Oral)   Resp 14   Wt 230 lb 3.2 oz (104.4 kg)   LMP 04/29/1997   SpO2 95%   BMI 39.51 kg/m  Wt Readings from Last 3 Encounters:  12/19/16 230 lb 3.2 oz (104.4 kg)  09/12/16 231 lb 3.2 oz (104.9 kg)  05/23/16 220 lb 12.8 oz (100.2 kg)     Lab Results  Component Value Date   WBC 8.6 05/23/2016   HGB 14.0 05/23/2016   HCT 42.0 05/23/2016   PLT 291.0 05/23/2016   GLUCOSE 93 12/19/2016   CHOL 159 12/19/2016   TRIG 112.0 12/19/2016   HDL 46.80 12/19/2016   LDLDIRECT 166.0 03/18/2015   LDLCALC 90 12/19/2016   ALT 15 12/19/2016   AST 20 12/19/2016   NA 138 12/19/2016   K 4.5 12/19/2016   CL 102 12/19/2016   CREATININE 1.02 12/19/2016   BUN 13 12/19/2016   CO2 30 12/19/2016   TSH 2.03 05/23/2016   INR 3.2 (H) 12/19/2016   HGBA1C 5.9  12/19/2016    Dg Bone Density  Result Date: 07/11/2016 EXAM: DUAL X-RAY ABSORPTIOMETRY (DXA) FOR BONE MINERAL DENSITY IMPRESSION: Dear Dr. Nicki Reaper, Your patient Rakiya Hainer completed a BMD test on 07/11/2016 using the [place] (analysis version: 14.10) manufactured by [employer]. The following summarizes the results of our evaluation. PATIENT BIOGRAPHICAL: Name: Kaysea, Raya Patient ID: [id number] Birth Date: 03/19/1945 Height: 62.5 in. Gender: Female Exam Date:  07/11/2016 Weight: 222.2 lbs. Indications: asthma, Caucasian, Height Loss, Postmenopausal Fractures: Treatments: warfin ASSESSMENT: The BMD measured at Femur Neck Left is 0.927 g/cm2 with a T-score of -0.8. This patient is considered normal according to [facility]) criteria. Lumbar spine was not utilized due to advanced degenerative changes. Site Region Measured Measured WHO Young Adult BMD Date       Age      Classification T-score DualFemur Neck Left 07/11/2016 72.7 Normal -0.8 0.927 g/cm2 Left Forearm Radius 33% 07/11/2016 72.7 Normal -0.5 0.830 g/cm2 World Health Organization [facility]) criteria for post-menopausal, Caucasian Women: Normal:       T-score at or above -1 SD Osteopenia:   T-score between -1 and -2.5 SD Osteoporosis: T-score at or below -2.5 SD RECOMMENDATIONS: [place] recommends that FDA-approved medical therapies be considered in postmenopausal women and men age 83 or older with a: 1. Hip or vertebral (clinical or morphometric) fracture. 2. T-score of < -2.5 at the spine or hip. 3. Ten-year fracture probability by FRAX of 3% or greater for hip fracture or 20% or greater for major osteoporotic fracture. All treatment decisions require clinical judgment and consideration of individual patient factors, including patient preferences, co-morbidities, previous drug use, risk factors not captured in the FRAX model (e.g. falls, vitamin D deficiency, increased bone turnover, interval  significant decline in bone density) and possible under - or over-estimation of fracture risk by FRAX. All patients should ensure an adequate intake of dietary calcium (1200 mg/d) and vitamin D (800 IU daily) unless contraindicated. FOLLOW-UP: People with diagnosed cases of osteoporosis or at high risk for fracture should have regular bone mineral density tests. For patients eligible for Medicare, routine testing is allowed once every 2 years. The testing frequency can be increased to one year for patients who have rapidly progressing disease, those who are receiving or discontinuing medical therapy to restore bone mass, or have additional risk factors. I have reviewed this report, and agree with the above findings. [facility] Electronically Signed   By: Lowella Grip III M.D.   On: 07/11/2016 10:55       Assessment & Plan:   Problem List Items Addressed This Visit    Asthma    Breathing stable.  With some stinging and burning in her nose.  Saline nasal spray and flonase as directed.  Robitussin as directed.  Follow.  Hold abx.  Follow.        Diarrhea    Intermittent flares.  Discussed monitoring for triggers.  Probiotics.  Follow.      Essential hypertension, benign   Relevant Orders   Basic metabolic panel (Completed)   History of blood clots    On coumadin.  Recheck pt/inr today.       Hypercholesterolemia   Relevant Orders   Lipid panel (Completed)   Hepatic function panel (Completed)   Hyperglycemia - Primary   Relevant Orders   Hemoglobin A1c (Completed)   Stress    On citalopram.  Doing well.  Follow.         Other Visit Diagnoses    Anticoagulated on Coumadin       Relevant Orders   Protime-INR (Completed)       Einar Pheasant, MD

## 2016-12-22 NOTE — Assessment & Plan Note (Signed)
On coumadin.  Recheck pt/inr today.  

## 2016-12-22 NOTE — Assessment & Plan Note (Signed)
Intermittent flares.  Discussed monitoring for triggers.  Probiotics.  Follow.

## 2016-12-22 NOTE — Assessment & Plan Note (Signed)
Breathing stable.  With some stinging and burning in her nose.  Saline nasal spray and flonase as directed.  Robitussin as directed.  Follow.  Hold abx.  Follow.

## 2016-12-22 NOTE — Assessment & Plan Note (Signed)
On citalopram.  Doing well.  Follow.   

## 2016-12-26 ENCOUNTER — Other Ambulatory Visit: Payer: Medicare Other

## 2017-03-05 ENCOUNTER — Telehealth: Payer: Self-pay

## 2017-03-05 DIAGNOSIS — Z7901 Long term (current) use of anticoagulants: Secondary | ICD-10-CM
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
## 2017-03-05 NOTE — Telephone Encounter (Signed)
Order placed for f/u pt/inr

## 2017-03-06 ENCOUNTER — Other Ambulatory Visit (INDEPENDENT_AMBULATORY_CARE_PROVIDER_SITE_OTHER): Payer: Medicare Other

## 2017-03-06 DIAGNOSIS — Z5181 Encounter for therapeutic drug level monitoring: Secondary | ICD-10-CM | POA: Diagnosis not present

## 2017-03-06 DIAGNOSIS — Z7901 Long term (current) use of anticoagulants: Secondary | ICD-10-CM

## 2017-03-06 LAB — PROTIME-INR
INR: 2.6 ratio — AB (ref 0.8–1.0)
PROTHROMBIN TIME: 27.6 s — AB (ref 9.6–13.1)

## 2017-03-08 ENCOUNTER — Other Ambulatory Visit: Payer: Self-pay | Admitting: Internal Medicine

## 2017-04-03 ENCOUNTER — Other Ambulatory Visit (INDEPENDENT_AMBULATORY_CARE_PROVIDER_SITE_OTHER): Payer: Medicare Other

## 2017-04-03 DIAGNOSIS — Z7901 Long term (current) use of anticoagulants: Secondary | ICD-10-CM | POA: Diagnosis not present

## 2017-04-03 LAB — PROTIME-INR
INR: 3 ratio — AB (ref 0.8–1.0)
Prothrombin Time: 32.1 s — ABNORMAL HIGH (ref 9.6–13.1)

## 2017-04-06 ENCOUNTER — Other Ambulatory Visit: Payer: Self-pay | Admitting: Internal Medicine

## 2017-04-10 ENCOUNTER — Ambulatory Visit (INDEPENDENT_AMBULATORY_CARE_PROVIDER_SITE_OTHER): Payer: Medicare Other | Admitting: Internal Medicine

## 2017-04-10 ENCOUNTER — Ambulatory Visit: Payer: Medicare Other

## 2017-04-10 ENCOUNTER — Ambulatory Visit
Admission: RE | Admit: 2017-04-10 | Discharge: 2017-04-10 | Disposition: A | Discharge status: Home / Self Care | Payer: Medicare Other | Source: Ambulatory Visit | Attending: Internal Medicine | Admitting: Internal Medicine

## 2017-04-10 ENCOUNTER — Encounter: Payer: Self-pay | Admitting: Internal Medicine

## 2017-04-10 VITALS — BP 128/78 | HR 71 | Temp 98.1°F | Resp 18 | Wt 234.0 lb

## 2017-04-10 DIAGNOSIS — Z0000 Encounter for general adult medical examination without abnormal findings: Secondary | ICD-10-CM | POA: Diagnosis not present

## 2017-04-10 DIAGNOSIS — R739 Hyperglycemia, unspecified: Secondary | ICD-10-CM

## 2017-04-10 DIAGNOSIS — E7800 Pure hypercholesterolemia, unspecified: Secondary | ICD-10-CM

## 2017-04-10 DIAGNOSIS — Z86718 Personal history of other venous thrombosis and embolism: Secondary | ICD-10-CM

## 2017-04-10 DIAGNOSIS — M79606 Pain in leg, unspecified: Secondary | ICD-10-CM | POA: Diagnosis not present

## 2017-04-10 DIAGNOSIS — Z1239 Encounter for other screening for malignant neoplasm of breast: Secondary | ICD-10-CM

## 2017-04-10 DIAGNOSIS — F439 Reaction to severe stress, unspecified: Secondary | ICD-10-CM | POA: Diagnosis not present

## 2017-04-10 DIAGNOSIS — M79605 Pain in left leg: Secondary | ICD-10-CM | POA: Insufficient documentation

## 2017-04-10 DIAGNOSIS — Z1231 Encounter for screening mammogram for malignant neoplasm of breast: Secondary | ICD-10-CM | POA: Diagnosis not present

## 2017-04-10 DIAGNOSIS — J4520 Mild intermittent asthma, uncomplicated: Secondary | ICD-10-CM | POA: Diagnosis not present

## 2017-04-10 DIAGNOSIS — I10 Essential (primary) hypertension: Secondary | ICD-10-CM

## 2017-04-10 LAB — BASIC METABOLIC PANEL WITH GFR
BUN: 15 mg/dL (ref 6–23)
CO2: 29 meq/L (ref 19–32)
Calcium: 9.3 mg/dL (ref 8.4–10.5)
Chloride: 105 meq/L (ref 96–112)
Creatinine, Ser: 1.07 mg/dL (ref 0.40–1.20)
GFR: 53.35 mL/min — ABNORMAL LOW
Glucose, Bld: 97 mg/dL (ref 70–99)
Potassium: 4.5 meq/L (ref 3.5–5.1)
Sodium: 140 meq/L (ref 135–145)

## 2017-04-10 LAB — HEPATIC FUNCTION PANEL
ALT: 18 U/L (ref 0–35)
AST: 21 U/L (ref 0–37)
Albumin: 4.3 g/dL (ref 3.5–5.2)
Alkaline Phosphatase: 51 U/L (ref 39–117)
Bilirubin, Direct: 0.1 mg/dL (ref 0.0–0.3)
Total Bilirubin: 0.6 mg/dL (ref 0.2–1.2)
Total Protein: 7.5 g/dL (ref 6.0–8.3)

## 2017-04-10 LAB — CBC WITH DIFFERENTIAL/PLATELET
Basophils Absolute: 0.1 K/uL (ref 0.0–0.1)
Basophils Relative: 0.9 % (ref 0.0–3.0)
Eosinophils Absolute: 0.3 K/uL (ref 0.0–0.7)
Eosinophils Relative: 4 % (ref 0.0–5.0)
HCT: 42.1 % (ref 36.0–46.0)
Hemoglobin: 14.2 g/dL (ref 12.0–15.0)
Lymphocytes Relative: 31.4 % (ref 12.0–46.0)
Lymphs Abs: 2.4 K/uL (ref 0.7–4.0)
MCHC: 33.6 g/dL (ref 30.0–36.0)
MCV: 88.1 fl (ref 78.0–100.0)
Monocytes Absolute: 0.5 K/uL (ref 0.1–1.0)
Monocytes Relative: 6.9 % (ref 3.0–12.0)
Neutro Abs: 4.3 K/uL (ref 1.4–7.7)
Neutrophils Relative %: 56.8 % (ref 43.0–77.0)
Platelets: 277 K/uL (ref 150.0–400.0)
RBC: 4.78 Mil/uL (ref 3.87–5.11)
RDW: 13.6 % (ref 11.5–15.5)
WBC: 7.5 K/uL (ref 4.0–10.5)

## 2017-04-10 LAB — HEMOGLOBIN A1C: Hgb A1c MFr Bld: 6 % (ref 4.6–6.5)

## 2017-04-10 LAB — LIPID PANEL
CHOL/HDL RATIO: 3
Cholesterol: 156 mg/dL (ref 0–200)
HDL: 52 mg/dL (ref >39.00)
LDL Cholesterol: 72 mg/dL (ref 0–99)
NONHDL: 104.32
Triglycerides: 162 mg/dL — ABNORMAL HIGH (ref 0.0–149.0)
VLDL: 32.4 mg/dL (ref 0.0–40.0)

## 2017-04-10 LAB — PROTIME-INR
INR: 3.6 ratio — ABNORMAL HIGH (ref 0.8–1.0)
Prothrombin Time: 38.7 s — ABNORMAL HIGH (ref 9.6–13.1)

## 2017-04-10 LAB — TSH: TSH: 4.42 u[IU]/mL (ref 0.35–4.50)

## 2017-04-10 MED ORDER — NYSTATIN 100000 UNIT/GM EX POWD: Freq: Four times a day (QID) | CUTANEOUS | 0 refills | Status: DC

## 2017-04-10 MED ORDER — NYSTATIN 100000 UNIT/GM EX CREA: 1.0000 "application " | TOPICAL_CREAM | Freq: Two times a day (BID) | CUTANEOUS | 0 refills | Status: DC

## 2017-04-10 NOTE — Assessment & Plan Note (Signed)
Physical today 04/10/17.  Mammogram 03/14/16 - Birads I.  Schedule for f/u mammogram.  Colonoscopy per report 2012. Recommended f/u in 10 years.

## 2017-04-10 NOTE — Progress Notes (Signed)
In [name], female   DOB: 1945-06-15, 72 y.o.   MRN: [id number]   Subjective:    [name], female    DOB: 1945/09/23, 72 y.o.   MRN: [id number]  HPI  Patient here for her physical exam.  She reports she is doing relatively well.  Trying to stay active.  No chest pain.  Breathing stable.  No acid reflux.  No abdominal pain.  Bowels moving.  She does report noticing a pulling/tight sensation in her left leg.  Discomfort in the popliteal region.  No injury.  No increased erythema or warmth.  Some increased stress with her grandson's separation.  Not seeing her great grand children as much.  Overall she feels she is handling things relatively well.     Past Medical History:  Diagnosis Date  . Allergy   . Asthma   . Chicken pox   . History of blood clots   . Hypercholesterolemia   . Hypertension    Past Surgical History:  Procedure Laterality Date  . blood clots  2008   Family History  Problem Relation Age of Onset  . Cancer Mother        Breast  . Heart disease Mother   . Stroke Mother   . Hypertension Mother   . Breast cancer Mother        32's  . Heart disease Father   . Stroke Father   . Hypertension Father   . Diabetes Father   . Colon cancer Unknown        paternal cousin   Social History   Socioeconomic History  . Marital status: Widowed    Spouse name: None  . Number of children: None  . Years of education: None  . Highest education level: None  Social Needs  . Financial resource strain: None  . Food insecurity - worry: None  . Food insecurity - inability: None  . Transportation needs - medical: None  . Transportation needs - non-medical: None  Occupational History  . None  Tobacco Use  . Smoking status: Never Smoker  . Smokeless tobacco: Never Used  Substance and Sexual Activity  . Alcohol use: No    Alcohol/week: 0.0 oz  . Drug use: No  . Sexual activity: None  Other Topics Concern  . None  Social History Narrative    . None    Outpatient Encounter Medications as of 04/10/2017  Medication Sig  . acetaminophen (TYLENOL) 650 MG CR tablet Take 650 mg by mouth every 8 (eight) hours as needed for pain.  Marland Kitchen albuterol (PROVENTIL HFA) 108 (90 BASE) MCG/ACT inhaler Inhale 2 puffs into the lungs every 6 (six) hours as needed for wheezing.  Marland Kitchen albuterol (PROVENTIL HFA;VENTOLIN HFA) 108 (90 Base) MCG/ACT inhaler Inhale 2 puffs into the lungs every 6 (six) hours as needed for wheezing or shortness of breath.  . citalopram (CELEXA) 20 MG tablet TAKE ONE (1) TABLET EACH DAY  . clotrimazole (MYCELEX) 10 MG troche Take 1 tablet (10 mg total) by mouth 3 (three) times daily.  . diphenhydrAMINE (BENADRYL) 25 mg capsule Take 25 mg by mouth every 6 (six) hours as needed.  . fluticasone (FLONASE) 50 MCG/ACT nasal spray Place 2 sprays into both nostrils daily.  . fluticasone (FLOVENT HFA) 110 MCG/ACT inhaler Inhale 2 puffs into the lungs 2 (two) times daily.  Marland Kitchen losartan (COZAAR) 50 MG tablet TAKE ONE (1) TABLET EACH DAY  . nystatin (MYCOSTATIN) 100000 UNIT/ML suspension Take  5 mLs (500,000 Units total) by mouth 3 (three) times daily.  . nystatin (NYSTATIN) powder Apply topically 4 (four) times daily.  . nystatin cream (MYCOSTATIN) Apply 1 application topically 2 (two) times daily.  . pantoprazole (PROTONIX) 40 MG tablet TAKE ONE (1) TABLET EACH DAY  . predniSONE (DELTASONE) 10 MG tablet Take 6 tablets x 1 day and then decrease by 1/2 tablet per day until down to zero mg.  . rosuvastatin (CRESTOR) 5 MG tablet TAKE ONE (1) TABLET EACH DAY  . warfarin (COUMADIN) 5 MG tablet TAKE ONE (1) TABLET EACH DAY   No facility-administered encounter medications on file as of 04/10/2017.     Review of Systems  Constitutional: Negative for appetite change and unexpected weight change.  HENT: Negative for congestion and sinus pressure.   Eyes: Negative for pain and visual disturbance.  Respiratory: Negative for cough, chest tightness and  shortness of breath.   Cardiovascular: Negative for chest pain, palpitations and leg swelling.  Gastrointestinal: Negative for abdominal pain, diarrhea, nausea and vomiting.  Genitourinary: Negative for difficulty urinating, dysuria and frequency.  Musculoskeletal: Negative for joint swelling and myalgias.       Left leg pain as outlined.    Skin: Negative for color change and rash.  Neurological: Negative for dizziness, light-headedness and headaches.  Hematological: Negative for adenopathy. Does not bruise/bleed easily.  Psychiatric/Behavioral: Negative for agitation and dysphoric mood.       Objective:    Physical Exam  Constitutional: She is oriented to person, place, and time. She appears well-developed and well-nourished. No distress.  HENT:  Nose: Nose normal.  Mouth/Throat: Oropharynx is clear and moist.  Eyes: Right eye exhibits no discharge. Left eye exhibits no discharge. No scleral icterus.  Neck: Neck supple. No thyromegaly present.  Cardiovascular: Normal rate and regular rhythm.  Pulmonary/Chest: Breath sounds normal. No accessory muscle usage. No tachypnea. No respiratory distress. She has no decreased breath sounds. She has no wheezes. She has no rhonchi. Right breast exhibits no inverted nipple, no mass, no nipple discharge and no tenderness (no axillary adenopathy). Left breast exhibits no inverted nipple, no mass, no nipple discharge and no tenderness (no axilarry adenopathy).  Abdominal: Soft. Bowel sounds are normal. There is no tenderness.  Musculoskeletal: She exhibits no edema.  Increased pain palpation - left popliteal region.  No increased swelling or erythema.    Lymphadenopathy:    She has no cervical adenopathy.  Neurological: She is alert and oriented to person, place, and time.  Skin: Skin is warm. No rash noted. No erythema.  Psychiatric: She has a normal mood and affect. Her behavior is normal.    BP 128/78 (BP Location: Left Arm, Patient Position:  Sitting, Cuff Size: Large)   Pulse 71   Temp 98.1 F (36.7 C) (Oral)   Resp 18   Wt 234 lb (106.1 kg)   LMP 04/29/1997   SpO2 95%   BMI 40.17 kg/m  Wt Readings from Last 3 Encounters:  04/10/17 234 lb (106.1 kg)  12/19/16 230 lb 3.2 oz (104.4 kg)  09/12/16 231 lb 3.2 oz (104.9 kg)     Lab Results  Component Value Date   WBC 7.5 04/10/2017   HGB 14.2 04/10/2017   HCT 42.1 04/10/2017   PLT 277.0 04/10/2017   GLUCOSE 97 04/10/2017   CHOL 156 04/10/2017   TRIG 162.0 (H) 04/10/2017   HDL 52.00 04/10/2017   LDLDIRECT 166.0 03/18/2015   LDLCALC 72 04/10/2017   ALT 18 04/10/2017     AST 21 04/10/2017   NA 140 04/10/2017   K 4.5 04/10/2017   CL 105 04/10/2017   CREATININE 1.07 04/10/2017   BUN 15 04/10/2017   CO2 29 04/10/2017   TSH 4.42 04/10/2017   INR 3.6 (H) 04/10/2017   HGBA1C 6.0 04/10/2017    Dg Bone Density  Result Date: 07/11/2016 EXAM: DUAL X-RAY ABSORPTIOMETRY (DXA) FOR BONE MINERAL DENSITY IMPRESSION: Dear Dr. Scott, Your patient Swayze Hernandes completed a BMD test on 07/11/2016 using the [employer] iDXA DXA System (analysis version: 14.10) manufactured by [employer]. The following summarizes the results of our evaluation. PATIENT BIOGRAPHICAL: Name: Pape, Mamye S Patient ID: [id number] Birth Date: 10/17/1945 Height: 62.5 in. Gender: Female Exam Date: 07/11/2016 Weight: 222.2 lbs. Indications: asthma, Caucasian, Height Loss, Postmenopausal Fractures: Treatments: warfin ASSESSMENT: The BMD measured at Femur Neck Left is 0.927 g/cm2 with a T-score of -0.8. This patient is considered normal according to World Health Organization (WHO) criteria. Lumbar spine was not utilized due to advanced degenerative changes. Site Region Measured Measured WHO Young Adult BMD Date       Age      Classification T-score DualFemur Neck Left 07/11/2016 72.7 Normal -0.8 0.927 g/cm2 Left Forearm Radius 33% 07/11/2016 72.7 Normal -0.5 0.830 g/cm2 World Health Organization (WHO) criteria for  post-menopausal, Caucasian Women: Normal:       T-score at or above -1 SD Osteopenia:   T-score between -1 and -2.5 SD Osteoporosis: T-score at or below -2.5 SD RECOMMENDATIONS: National Osteoporosis Foundation recommends that FDA-approved medical therapies be considered in postmenopausal women and men age 50 or older with a: 1. Hip or vertebral (clinical or morphometric) fracture. 2. T-score of < -2.5 at the spine or hip. 3. Ten-year fracture probability by FRAX of 3% or greater for hip fracture or 20% or greater for major osteoporotic fracture. All treatment decisions require clinical judgment and consideration of individual patient factors, including patient preferences, co-morbidities, previous drug use, risk factors not captured in the FRAX model (e.g. falls, vitamin D deficiency, increased bone turnover, interval significant decline in bone density) and possible under - or over-estimation of fracture risk by FRAX. All patients should ensure an adequate intake of dietary calcium (1200 mg/d) and vitamin D (800 IU daily) unless contraindicated. FOLLOW-UP: People with diagnosed cases of osteoporosis or at high risk for fracture should have regular bone mineral density tests. For patients eligible for Medicare, routine testing is allowed once every 2 years. The testing frequency can be increased to one year for patients who have rapidly progressing disease, those who are receiving or discontinuing medical therapy to restore bone mass, or have additional risk factors. I have reviewed this report, and agree with the above findings. [place] Electronically Signed   By: William  Woodruff III M.D.   On: 07/11/2016 10:55       Assessment & Plan:   Problem List Items Addressed This Visit    Asthma    Breathing stable.        Essential hypertension, benign    Blood pressure under good control.  Continue same medication regimen.  Follow pressures.  Follow metabolic panel.        Relevant Orders    CBC with Differential/Platelet (Completed)   TSH (Completed)   Basic metabolic panel (Completed)   Health care maintenance    Physical today 04/10/17.  Mammogram 03/14/16 - Birads I.  Schedule for f/u mammogram.  Colonoscopy per report 2012. Recommended f/u in 10 years.          History of blood clots    On coumadin.  Recheck pt/inr.  With leg pain as outlined.  Given pain, exam and history of clots, check abdominal ultrasound.        Relevant Orders   Protime-INR (Completed)   US Venous Img Lower Unilateral Left (Completed)   Hypercholesterolemia    On crestor.  Low cholesterol die tand exercise.  Follow lipid panel and liver function tests.        Relevant Orders   Hepatic function panel (Completed)   Lipid panel (Completed)   Hyperglycemia    Low carb diet and exercise.  Follow met b and a1c.        Relevant Orders   Hemoglobin A1c (Completed)   Leg pain    Left leg and popliteal pain.  No swelling.  Discomfort localized popliteal region.  Check lower extremity ultrasound.        Stress    On citalopram.  Overall doing well.  Follow.         Other Visit Diagnoses    Routine general medical examination at a health care facility    -  Primary   Breast cancer screening       Relevant Orders   MM DIGITAL SCREENING BILATERAL   Left leg pain       Relevant Orders   US Venous Img Lower Unilateral Left (Completed)       SCOTT, CHARLENE, MD  

## 2017-04-11 ENCOUNTER — Telehealth: Payer: Self-pay

## 2017-04-11 NOTE — Telephone Encounter (Signed)
Copied from [place]. Topic: Inquiry >> Apr 11, 2017 12:36 PM Tina Duarte wrote: Reason for CRM: pt called regarding her INR results; pt states the levels could have been off Duarte/c she has been taking Tylenol, contact pt if needed

## 2017-04-12 NOTE — Telephone Encounter (Signed)
See result note.  

## 2017-04-14 ENCOUNTER — Encounter: Payer: Self-pay | Admitting: Internal Medicine

## 2017-04-14 NOTE — Assessment & Plan Note (Signed)
On coumadin.  Recheck pt/inr.  With leg pain as outlined.  Given pain, exam and history of clots, check abdominal ultrasound.

## 2017-04-14 NOTE — Assessment & Plan Note (Signed)
On crestor.  Low cholesterol diet and exercise.  Follow lipid panel and liver function tests.   

## 2017-04-14 NOTE — Assessment & Plan Note (Signed)
Breathing stable.

## 2017-04-14 NOTE — Assessment & Plan Note (Signed)
Left leg and popliteal pain.  No swelling.  Discomfort localized popliteal region.  Check lower extremity ultrasound.

## 2017-04-14 NOTE — Assessment & Plan Note (Signed)
Low carb diet and exercise.  Follow met b and a1c.   

## 2017-04-14 NOTE — Assessment & Plan Note (Signed)
On citalopram.  Overall doing well.  Follow.

## 2017-04-14 NOTE — Assessment & Plan Note (Signed)
Blood pressure under good control.  Continue same medication regimen.  Follow pressures.  Follow metabolic panel.   

## 2017-04-15 ENCOUNTER — Encounter: Payer: Self-pay | Admitting: Internal Medicine

## 2017-04-15 DIAGNOSIS — M79605 Pain in left leg: Secondary | ICD-10-CM

## 2017-04-16 NOTE — Telephone Encounter (Signed)
Given persistent pain, I would like to refer you to ortho for further evaluation and treatment.  If agreeable, let me know and I will place order for referral.  Also, if increased pain and problems, can go to [facility] walk in.

## 2017-04-16 NOTE — Telephone Encounter (Signed)
Patient would like to be referred to Dr. Sabra Heck.

## 2017-04-16 NOTE — Telephone Encounter (Signed)
Order placed for ortho referral.   

## 2017-04-16 NOTE — Telephone Encounter (Signed)
Seen 04/10/17 leg popliteal pain, [place] ordered no thrombus?

## 2017-04-17 ENCOUNTER — Telehealth: Payer: Self-pay

## 2017-04-17 ENCOUNTER — Other Ambulatory Visit: Payer: Medicare Other

## 2017-04-17 NOTE — Telephone Encounter (Signed)
Copied from [place]. Topic: Quick Communication - Appointment Cancellation >> Apr 17, 2017 10:22 AM Darl Householder, RMA wrote: Patient called to cancel appointment scheduled for 04/17/17 @ 11:30 am. Patient has rescheduled their appointment for 04/20/17 @ 11:00 am

## 2017-04-17 NOTE — Telephone Encounter (Signed)
Pt coming in for labs.

## 2017-04-20 ENCOUNTER — Other Ambulatory Visit: Payer: Medicare Other

## 2017-04-27 ENCOUNTER — Other Ambulatory Visit (INDEPENDENT_AMBULATORY_CARE_PROVIDER_SITE_OTHER): Payer: Medicare Other

## 2017-04-27 ENCOUNTER — Ambulatory Visit: Payer: Medicare Other

## 2017-04-27 DIAGNOSIS — M1712 Unilateral primary osteoarthritis, left knee: Secondary | ICD-10-CM | POA: Diagnosis not present

## 2017-04-27 DIAGNOSIS — Z7901 Long term (current) use of anticoagulants: Secondary | ICD-10-CM

## 2017-04-27 LAB — PROTIME-INR
INR: 3 ratio — AB (ref 0.8–1.0)
PROTHROMBIN TIME: 31.7 s — AB (ref 9.6–13.1)

## 2017-05-01 ENCOUNTER — Telehealth: Payer: Self-pay | Admitting: *Deleted

## 2017-05-01 DIAGNOSIS — Z7901 Long term (current) use of anticoagulants: Secondary | ICD-10-CM

## 2017-05-01 NOTE — Telephone Encounter (Signed)
Standing orders placed

## 2017-05-10 ENCOUNTER — Other Ambulatory Visit (INDEPENDENT_AMBULATORY_CARE_PROVIDER_SITE_OTHER): Payer: Medicare Other

## 2017-05-10 DIAGNOSIS — Z7901 Long term (current) use of anticoagulants: Secondary | ICD-10-CM | POA: Diagnosis not present

## 2017-05-10 LAB — PROTIME-INR
INR: 4.3 ratio — AB (ref 0.8–1.0)
PROTHROMBIN TIME: 46.2 s — AB (ref 9.6–13.1)

## 2017-05-11 ENCOUNTER — Other Ambulatory Visit (INDEPENDENT_AMBULATORY_CARE_PROVIDER_SITE_OTHER): Payer: Medicare Other

## 2017-05-11 DIAGNOSIS — Z7901 Long term (current) use of anticoagulants: Secondary | ICD-10-CM

## 2017-05-11 LAB — PROTIME-INR
INR: 4.3 ratio — ABNORMAL HIGH (ref 0.8–1.0)
Prothrombin Time: 45.4 s — ABNORMAL HIGH (ref 9.6–13.1)

## 2017-05-14 ENCOUNTER — Other Ambulatory Visit (INDEPENDENT_AMBULATORY_CARE_PROVIDER_SITE_OTHER): Payer: Medicare Other

## 2017-05-14 DIAGNOSIS — Z7901 Long term (current) use of anticoagulants: Secondary | ICD-10-CM

## 2017-05-14 LAB — PROTIME-INR
INR: 1.4 ratio — ABNORMAL HIGH (ref 0.8–1.0)
Prothrombin Time: 15.2 s — ABNORMAL HIGH (ref 9.6–13.1)

## 2017-05-22 ENCOUNTER — Other Ambulatory Visit: Payer: Medicare Other

## 2017-05-29 ENCOUNTER — Other Ambulatory Visit: Payer: Medicare Other

## 2017-06-01 ENCOUNTER — Other Ambulatory Visit: Payer: Self-pay | Admitting: Internal Medicine

## 2017-06-08 ENCOUNTER — Other Ambulatory Visit (INDEPENDENT_AMBULATORY_CARE_PROVIDER_SITE_OTHER): Payer: Medicare Other

## 2017-06-08 DIAGNOSIS — Z7901 Long term (current) use of anticoagulants: Secondary | ICD-10-CM

## 2017-06-08 LAB — PROTIME-INR
INR: 2.2 ratio — ABNORMAL HIGH (ref 0.8–1.0)
Prothrombin Time: 24.9 s — ABNORMAL HIGH (ref 9.6–13.1)

## 2017-06-15 ENCOUNTER — Other Ambulatory Visit: Payer: Medicare Other

## 2017-06-26 ENCOUNTER — Encounter: Payer: Self-pay | Admitting: *Deleted

## 2017-06-26 ENCOUNTER — Emergency Department
Admission: EM | Admit: 2017-06-26 | Discharge: 2017-06-27 | Disposition: A | Discharge status: Home / Self Care | Payer: Medicare Other | Attending: Emergency Medicine | Admitting: Emergency Medicine

## 2017-06-26 ENCOUNTER — Other Ambulatory Visit: Payer: Self-pay

## 2017-06-26 DIAGNOSIS — J45909 Unspecified asthma, uncomplicated: Secondary | ICD-10-CM | POA: Diagnosis not present

## 2017-06-26 DIAGNOSIS — I10 Essential (primary) hypertension: Secondary | ICD-10-CM | POA: Insufficient documentation

## 2017-06-26 DIAGNOSIS — R197 Diarrhea, unspecified: Secondary | ICD-10-CM | POA: Insufficient documentation

## 2017-06-26 DIAGNOSIS — R112 Nausea with vomiting, unspecified: Secondary | ICD-10-CM | POA: Insufficient documentation

## 2017-06-26 DIAGNOSIS — Z7901 Long term (current) use of anticoagulants: Secondary | ICD-10-CM | POA: Diagnosis not present

## 2017-06-26 DIAGNOSIS — Z79899 Other long term (current) drug therapy: Secondary | ICD-10-CM | POA: Insufficient documentation

## 2017-06-26 LAB — COMPREHENSIVE METABOLIC PANEL
ALK PHOS: 50 U/L (ref 38–126)
ALT: 17 U/L (ref 14–54)
ANION GAP: 8 (ref 5–15)
AST: 23 U/L (ref 15–41)
Albumin: 4 g/dL (ref 3.5–5.0)
BILIRUBIN TOTAL: 1 mg/dL (ref 0.3–1.2)
BUN: 17 mg/dL (ref 6–20)
CALCIUM: 8.6 mg/dL — AB (ref 8.9–10.3)
CO2: 27 mmol/L (ref 22–32)
Chloride: 103 mmol/L (ref 101–111)
Creatinine, Ser: 1.04 mg/dL — ABNORMAL HIGH (ref 0.44–1.00)
GFR, EST NON AFRICAN AMERICAN: 52 mL/min — AB (ref >60)
Glucose, Bld: 119 mg/dL — ABNORMAL HIGH (ref 65–99)
Potassium: 3.8 mmol/L (ref 3.5–5.1)
Sodium: 138 mmol/L (ref 135–145)
TOTAL PROTEIN: 7.1 g/dL (ref 6.5–8.1)

## 2017-06-26 LAB — CBC
HCT: 40.7 % (ref 35.0–47.0)
HEMOGLOBIN: 13.7 g/dL (ref 12.0–16.0)
MCH: 29.4 pg (ref 26.0–34.0)
MCHC: 33.6 g/dL (ref 32.0–36.0)
MCV: 87.6 fL (ref 80.0–100.0)
Platelets: 236 10*3/uL (ref 150–440)
RBC: 4.65 MIL/uL (ref 3.80–5.20)
RDW: 14 % (ref 11.5–14.5)
WBC: 7.9 10*3/uL (ref 3.6–11.0)

## 2017-06-26 LAB — PROTIME-INR
INR: 2.42
PROTHROMBIN TIME: 26.1 s — AB (ref 11.4–15.2)

## 2017-06-26 LAB — LIPASE, BLOOD: Lipase: 31 U/L (ref 11–51)

## 2017-06-26 MED ORDER — SODIUM CHLORIDE 0.9 % IV BOLUS
500.0000 mL | Freq: Once | INTRAVENOUS | Status: AC
  Administered 2017-06-26: 500 mL via INTRAVENOUS

## 2017-06-26 MED ORDER — ONDANSETRON HCL 4 MG/2ML IJ SOLN: 4.0000 mg | Freq: Once | INTRAMUSCULAR | Status: DC | PRN

## 2017-06-26 NOTE — ED Notes (Signed)
Pulse changed and moved and warm blanket to arm. Pt denies every needing to wear O2

## 2017-06-26 NOTE — ED Triage Notes (Signed)
Per EMS pt has had N/V/D x 1 hour

## 2017-06-26 NOTE — ED Provider Notes (Signed)
[facility] Emergency Department Provider Note   First MD Initiated Contact with Patient 06/26/17 2302     (approximate)  I have reviewed the triage vital signs and the nursing notes.   HISTORY  Chief Complaint Abdominal Pain    HPI Tina Duarte is a 72 y.o. female low list of chronic medical conditions presents to the emergency department with abrupt onset of nausea vomiting and diarrhea after eating pizza tonight.  Patient denies any abdominal pain.  Patient denies any blood noted in either the emesis or stool.  Patient states that all symptoms have resolved at this time no nausea no vomiting.  Patient denies any fever.  Has no complaints at present.   Past Medical History:  Diagnosis Date  . Allergy   . Asthma   . Chicken pox   . History of blood clots   . Hypercholesterolemia   . Hypertension     Patient Active Problem List   Diagnosis Date Noted  . Hyperglycemia 09/12/2016  . Sinusitis 04/05/2015  . Sore throat 10/17/2014  . Health care maintenance 09/20/2014  . LOC (loss of consciousness) ([place]) 06/09/2014  . Heel pain 05/31/2014  . Long term current use of anticoagulant therapy 04/04/2014  . Leg pain 02/16/2014  . Back pain 12/28/2013  . Stress 09/07/2013  . Cough 04/16/2013  . Left hip pain 12/05/2012  . 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 Rib pain 08/28/2012  . Essential hypertension, benign 05/22/2012  . Hypercholesterolemia 05/22/2012  . Environmental allergies 05/22/2012  . Asthma 05/22/2012  . History of blood clots 05/22/2012  . Diarrhea 05/22/2012    Past Surgical History:  Procedure Laterality Date  . blood clots  2008    Prior to Admission medications   Medication Sig Start Date End Date Taking? Authorizing Provider  acetaminophen (TYLENOL) 650 MG CR tablet Take 650 mg by mouth every 8 (eight) hours as needed for pain.    [provider]  albuterol (PROVENTIL HFA) 108 (90 BASE) MCG/ACT inhaler Inhale 2 puffs  into the lungs every 6 (six) hours as needed for wheezing. 06/08/14   Einar Pheasant, MD  albuterol (PROVENTIL HFA;VENTOLIN HFA) 108 (90 Base) MCG/ACT inhaler Inhale 2 puffs into the lungs every 6 (six) hours as needed for wheezing or shortness of breath. 04/08/15   Joanne Gavel, MD  citalopram (CELEXA) 20 MG tablet TAKE ONE (1) TABLET EACH DAY 04/06/17   Einar Pheasant, MD  clotrimazole (MYCELEX) 10 MG troche Take 1 tablet (10 mg total) by mouth 3 (three) times daily. 06/15/15   Einar Pheasant, MD  diphenhydrAMINE (BENADRYL) 25 mg capsule Take 25 mg by mouth every 6 (six) hours as needed.    [provider]  fluticasone (FLONASE) 50 MCG/ACT nasal spray Place 2 sprays into both nostrils daily. 10/18/15   Einar Pheasant, MD  fluticasone (FLOVENT HFA) 110 MCG/ACT inhaler Inhale 2 puffs into the lungs 2 (two) times daily. 06/08/14   Einar Pheasant, MD  losartan (COZAAR) 50 MG tablet TAKE ONE (1) TABLET EACH DAY 06/04/17   Einar Pheasant, MD  nystatin (MYCOSTATIN) 100000 UNIT/ML suspension Take 5 mLs (500,000 Units total) by mouth 3 (three) times daily. 05/05/15   Einar Pheasant, MD  nystatin (NYSTATIN) powder Apply topically 4 (four) times daily. 04/10/17   Einar Pheasant, MD  nystatin cream (MYCOSTATIN) Apply 1 application topically 2 (two) times daily. 04/10/17   Einar Pheasant, MD  pantoprazole (PROTONIX) 40 MG tablet TAKE ONE (1) TABLET EACH DAY  07/10/16   Einar Pheasant, MD  predniSONE (DELTASONE) 10 MG tablet Take 6 tablets x 1 day and then decrease by 1/2 tablet per day until down to zero mg. 12/21/15   Einar Pheasant, MD  rosuvastatin (CRESTOR) 5 MG tablet TAKE ONE (1) TABLET EACH DAY 04/06/17   Einar Pheasant, MD  warfarin (COUMADIN) 5 MG tablet TAKE ONE (1) TABLET EACH DAY 06/04/17   Einar Pheasant, MD    Allergies No known drug allergies  Family History  Problem Relation Age of Onset  . Cancer Mother        Breast  . Heart disease Mother   . Stroke Mother   . Hypertension Mother    . Breast cancer Mother        14's  . Heart disease Father   . Stroke Father   . Hypertension Father   . Diabetes Father   . Colon cancer Unknown        paternal cousin    Social History Social History   Tobacco Use  . Smoking status: Never Smoker  . Smokeless tobacco: Never Used  Substance Use Topics  . Alcohol use: No    Alcohol/week: 0.0 oz  . Drug use: No    Review of Systems Constitutional: No fever/chills Eyes: No visual changes. ENT: No sore throat. Cardiovascular: Denies chest pain. Respiratory: Denies shortness of breath. Gastrointestinal: No abdominal pain.  Positive for nausea vomiting and diarrhea Genitourinary: Negative for dysuria. Musculoskeletal: Negative for neck pain.  Negative for back pain. Integumentary: Negative for rash. Neurological: Negative for headaches, focal weakness or numbness.  ____________________________________________   PHYSICAL EXAM:  VITAL SIGNS: ED Triage Vitals  Enc Vitals Group     BP 06/26/17 2220 (!) 141/79     Pulse Rate 06/26/17 2220 (!) 55     Resp 06/26/17 2220 14     Temp 06/26/17 2220 (!) 97.3 F (36.3 C)     Temp Source 06/26/17 2220 Oral     SpO2 06/26/17 2220 (!) 82 %     Weight 06/26/17 2222 108.9 kg (240 lb)     Height 06/26/17 2222 1.626 m (5\' 4" )     Head Circumference --      Peak Flow --      Pain Score 06/26/17 2222 0     Pain Loc --      Pain Edu? --      Excl. in [place]? --     Constitutional: Alert and oriented. Well appearing and in no acute distress. Eyes: Conjunctivae are normal.  Head: Atraumatic. Mouth/Throat: Mucous membranes are moist.  Oropharynx non-erythematous. Neck: No stridor.   Cardiovascular: Normal rate, regular rhythm. Good peripheral circulation. Grossly normal heart sounds. Respiratory: Normal respiratory effort.  No retractions. Lungs CTAB. Gastrointestinal: Soft and nontender. No distention.  Musculoskeletal: No lower extremity tenderness nor edema. No gross deformities  of extremities. Neurologic:  Normal speech and language. No gross focal neurologic deficits are appreciated.  Skin:  Skin is warm, dry and intact. No rash noted. Psychiatric: Mood and affect are normal. Speech and behavior are normal.  ____________________________________________   LABS (all labs ordered are listed, but only abnormal results are displayed)  Labs Reviewed  COMPREHENSIVE METABOLIC PANEL - Abnormal; Notable for the following components:      Result Value   Glucose, Bld 119 (*)    Creatinine, Ser 1.04 (*)    Calcium 8.6 (*)    GFR calc non Af Amer 52 (*)    All  other components within normal limits  PROTIME-INR - Abnormal; Notable for the following components:   Prothrombin Time 26.1 (*)    All other components within normal limits  LIPASE, BLOOD  CBC  URINALYSIS, COMPLETE (UACMP) WITH MICROSCOPIC   ____________________________________________  EKG  ED ECG REPORT I, Notus N Louella Medaglia, the attending physician, personally viewed and interpreted this ECG.   Date: 06/26/2017  EKG Time: 10:15 PM  Rate: 62  Rhythm: Normal sinus rhythm  Axis: Normal  Intervals: Normal  ST&T Change: None      Procedures   ____________________________________________   INITIAL IMPRESSION / ASSESSMENT AND PLAN / ED COURSE  As part of my medical decision making, I reviewed the following data within the electronic MEDICAL RECORD NUMBER   72 year old female presented with above-stated history and physical exam concerning for possible infectious etiology of vomiting and diarrhea.  Patient given IV Zofran 4 mg as well as 1 L IV normal saline.  Patient had no further episodes of vomiting or diarrhea in the emergency department on reevaluation patient states that she feels much better and is agreeable to discharge plan.  I encouraged patient to follow-up with her primary care provider if diarrhea were to recur for stool samples to be  performed. ____________________________________________  FINAL CLINICAL IMPRESSION(S) / ED DIAGNOSES  Final diagnoses:  Nausea vomiting and diarrhea     MEDICATIONS GIVEN DURING THIS VISIT:  Medications  ondansetron (ZOFRAN) injection 4 mg (has no administration in time range)     ED Discharge Orders    None       Note:  This document was prepared using Dragon voice recognition software and may include unintentional dictation errors.    Gregor Hams, MD 06/27/17 [phone]

## 2017-06-27 ENCOUNTER — Telehealth: Payer: Self-pay | Admitting: Internal Medicine

## 2017-06-27 MED ORDER — ONDANSETRON 4 MG PO TBDP: 4.0000 mg | ORAL_TABLET | Freq: Three times a day (TID) | ORAL | 0 refills | Status: DC | PRN

## 2017-06-27 NOTE — ED Notes (Signed)
Up to BR with minimal assist

## 2017-06-27 NOTE — Telephone Encounter (Signed)
FYI  INR was 2.4

## 2017-06-27 NOTE — Telephone Encounter (Signed)
Copied from [place] [id number]. Topic: Quick Communication - See Telephone Encounter >> Jun 27, 2017  2:43 PM Rutherford Nail, [place] wrote: CRM for notification. See Telephone encounter for: 06/27/17. Patient calling and states that she was supposed to come to the office a week or so ago to get her PTINR checked. States that she had to go to hospital last night and they checked it. Patient wanted Dr Nicki Reaper to know that she had it check and that the results are in her chart. CB#: [phone]

## 2017-06-28 NOTE — Telephone Encounter (Signed)
Patient stated she is feeling better. She does not feel that she needs to be seen sooner but will call if anything changes. I have scheduled her for f/u INR

## 2017-06-28 NOTE — Telephone Encounter (Signed)
Notify pt that INR is ok.  Continue same coumadin dose and recheck pt/inr in 3 weeks.  States pt went to ER.  Is she feeling better.  Any issues now.  Any vomiting, nausea, sob, etc?  Does she need earlier f/u appt?

## 2017-07-10 ENCOUNTER — Other Ambulatory Visit: Payer: Self-pay | Admitting: Internal Medicine

## 2017-07-17 ENCOUNTER — Other Ambulatory Visit (INDEPENDENT_AMBULATORY_CARE_PROVIDER_SITE_OTHER): Payer: Medicare Other

## 2017-07-17 DIAGNOSIS — Z7901 Long term (current) use of anticoagulants: Secondary | ICD-10-CM | POA: Diagnosis not present

## 2017-07-17 LAB — PROTIME-INR
INR: 2.2 ratio — ABNORMAL HIGH (ref 0.8–1.0)
Prothrombin Time: 25.8 s — ABNORMAL HIGH (ref 9.6–13.1)

## 2017-07-24 ENCOUNTER — Ambulatory Visit (INDEPENDENT_AMBULATORY_CARE_PROVIDER_SITE_OTHER): Payer: Medicare Other | Admitting: Internal Medicine

## 2017-07-24 VITALS — BP 120/80 | HR 77 | Temp 98.2°F | Resp 18 | Ht 64.0 in | Wt 235.4 lb

## 2017-07-24 DIAGNOSIS — M6289 Other specified disorders of muscle: Secondary | ICD-10-CM | POA: Diagnosis not present

## 2017-07-24 DIAGNOSIS — J4520 Mild intermittent asthma, uncomplicated: Secondary | ICD-10-CM | POA: Diagnosis not present

## 2017-07-24 DIAGNOSIS — I10 Essential (primary) hypertension: Secondary | ICD-10-CM | POA: Diagnosis not present

## 2017-07-24 DIAGNOSIS — R739 Hyperglycemia, unspecified: Secondary | ICD-10-CM

## 2017-07-24 DIAGNOSIS — Z86718 Personal history of other venous thrombosis and embolism: Secondary | ICD-10-CM | POA: Diagnosis not present

## 2017-07-24 DIAGNOSIS — E7800 Pure hypercholesterolemia, unspecified: Secondary | ICD-10-CM | POA: Diagnosis not present

## 2017-07-24 LAB — SEDIMENTATION RATE: SED RATE: 18 mm/h (ref 0–30)

## 2017-07-24 LAB — BASIC METABOLIC PANEL
BUN: 13 mg/dL (ref 6–23)
CALCIUM: 9.1 mg/dL (ref 8.4–10.5)
CO2: 27 meq/L (ref 19–32)
CREATININE: 1 mg/dL (ref 0.40–1.20)
Chloride: 104 mEq/L (ref 96–112)
GFR: 57.64 mL/min — ABNORMAL LOW (ref >60.00)
GLUCOSE: 95 mg/dL (ref 70–99)
Potassium: 4.2 mEq/L (ref 3.5–5.1)
Sodium: 139 mEq/L (ref 135–145)

## 2017-07-24 LAB — CK: Total CK: 101 U/L (ref 7–177)

## 2017-07-24 NOTE — Progress Notes (Signed)
[name], female   DOB: 1945/12/14, 72 y.o.   MRN: [id number]   Subjective:    [name], female    DOB: [birth year], 72 y.o.   MRN: [id number]  HPI  Patient here for a scheduled follow up.  Pt was evaluated 06/26/17 for nausea, vomiting and diarrhea.  Was giving IV fluids.  No further episodes.  Eating.  Breathing overall stable.  No chest pain.  No abdominal pain.  Bowels stable.  Some aching in her knees.  Stiff when stands.  Some fatigue.  Some aching in her arms and legs at times.     Past Medical History:  Diagnosis Date  . Allergy   . Asthma   . Chicken pox   . History of blood clots   . Hypercholesterolemia   . Hypertension    Past Surgical History:  Procedure Laterality Date  . blood clots  2008   Family History  Problem Relation Age of Onset  . Cancer Mother        Breast  . Heart disease Mother   . Stroke Mother   . Hypertension Mother   . Breast cancer Mother        38's  . Heart disease Father   . Stroke Father   . Hypertension Father   . Diabetes Father   . Colon cancer Unknown        paternal cousin   Social History   Socioeconomic History  . Marital status: Widowed    Spouse name: Not on file  . Number of children: Not on file  . Years of education: Not on file  . Highest education level: Not on file  Occupational History  . Not on file  Social Needs  . Financial resource strain: Not on file  . Food insecurity:    Worry: Not on file    Inability: Not on file  . Transportation needs:    Medical: Not on file    Non-medical: Not on file  Tobacco Use  . Smoking status: Never Smoker  . Smokeless tobacco: Never Used  Substance and Sexual Activity  . Alcohol use: No    Alcohol/week: 0.0 oz  . Drug use: No  . Sexual activity: Not on file  Lifestyle  . Physical activity:    Days per week: Not on file    Minutes per session: Not on file  . Stress: Not on file  Relationships  . Social connections:    Talks on phone:  Not on file    Gets together: Not on file    Attends religious service: Not on file    Active member of club or organization: Not on file    Attends meetings of clubs or organizations: Not on file    Relationship status: Not on file  Other Topics Concern  . Not on file  Social History Narrative  . Not on file    Outpatient Encounter Medications as of 07/24/2017  Medication Sig  . acetaminophen (TYLENOL) 650 MG CR tablet Take 650 mg by mouth every 8 (eight) hours as needed for pain.  . citalopram (CELEXA) 20 MG tablet TAKE 1 TABLET BY MOUTH ONCE A DAY  . losartan (COZAAR) 50 MG tablet TAKE ONE (1) TABLET EACH DAY  . nystatin (MYCOSTATIN) 100000 UNIT/ML suspension Take 5 mLs (500,000 Units total) by mouth 3 (three) times daily.  Marland Kitchen nystatin (NYSTATIN) powder Apply topically 4 (four) times daily.  Marland Kitchen nystatin cream (MYCOSTATIN) Apply  1 application topically 2 (two) times daily.  . rosuvastatin (CRESTOR) 5 MG tablet TAKE 1 TABLET BY MOUTH ONCE A DAY  . warfarin (COUMADIN) 5 MG tablet TAKE ONE (1) TABLET EACH DAY  . [DISCONTINUED] albuterol (PROVENTIL HFA) 108 (90 BASE) MCG/ACT inhaler Inhale 2 puffs into the lungs every 6 (six) hours as needed for wheezing.  . [DISCONTINUED] albuterol (PROVENTIL HFA;VENTOLIN HFA) 108 (90 Base) MCG/ACT inhaler Inhale 2 puffs into the lungs every 6 (six) hours as needed for wheezing or shortness of breath.  . [DISCONTINUED] clotrimazole (MYCELEX) 10 MG troche Take 1 tablet (10 mg total) by mouth 3 (three) times daily.  . [DISCONTINUED] diphenhydrAMINE (BENADRYL) 25 mg capsule Take 25 mg by mouth every 6 (six) hours as needed.  . [DISCONTINUED] fluticasone (FLONASE) 50 MCG/ACT nasal spray Place 2 sprays into both nostrils daily.  . [DISCONTINUED] fluticasone (FLOVENT HFA) 110 MCG/ACT inhaler Inhale 2 puffs into the lungs 2 (two) times daily.  . [DISCONTINUED] ondansetron (ZOFRAN ODT) 4 MG disintegrating tablet Take 1 tablet (4 mg total) by mouth every 8 (eight)  hours as needed for nausea or vomiting.  . [DISCONTINUED] pantoprazole (PROTONIX) 40 MG tablet TAKE ONE (1) TABLET EACH DAY  . [DISCONTINUED] predniSONE (DELTASONE) 10 MG tablet Take 6 tablets x 1 day and then decrease by 1/2 tablet per day until down to zero mg.   No facility-administered encounter medications on file as of 07/24/2017.     Review of Systems  Constitutional: Negative for appetite change and unexpected weight change.  HENT: Negative for congestion and sinus pressure.   Respiratory: Negative for cough and chest tightness.        Breathing stable.    Cardiovascular: Negative for chest pain and palpitations.       No significant leg swelling.    Gastrointestinal: Negative for abdominal pain, diarrhea, nausea and vomiting.  Genitourinary: Negative for difficulty urinating and dysuria.  Musculoskeletal: Negative for joint swelling.       Knee pain as outlined.  Some leg and arm aching.    Skin: Negative for color change and rash.  Neurological: Negative for dizziness, light-headedness and headaches.  Psychiatric/Behavioral: Negative for agitation and dysphoric mood.       Objective:    Physical Exam  Constitutional: She appears well-developed and well-nourished. No distress.  HENT:  Nose: Nose normal.  Mouth/Throat: Oropharynx is clear and moist.  Neck: Neck supple. No thyromegaly present.  Cardiovascular: Normal rate and regular rhythm.  Pulmonary/Chest: Breath sounds normal. No respiratory distress. She has no wheezes.  Abdominal: Soft. Bowel sounds are normal. There is no tenderness.  Musculoskeletal: She exhibits no edema or tenderness.  Increased stiffness and discomfort going from sitting to standing - knees.  No muscle pain with palpation.   Lymphadenopathy:    She has no cervical adenopathy.  Skin: No rash noted. No erythema.  Psychiatric: She has a normal mood and affect. Her behavior is normal.    BP 120/80 (BP Location: Left Arm, Patient Position:  Sitting, Cuff Size: Large)   Pulse 77   Temp 98.2 F (36.8 C) (Oral)   Resp 18   Ht '5\' 4"'  (1.626 m)   Wt 235 lb 6.4 oz (106.8 kg)   LMP 04/29/1997   SpO2 98%   BMI 40.41 kg/m  Wt Readings from Last 3 Encounters:  07/24/17 235 lb 6.4 oz (106.8 kg)  06/26/17 240 lb (108.9 kg)  04/10/17 234 lb (106.1 kg)     Lab Results  Component  Value Date   WBC 7.9 06/26/2017   HGB 13.7 06/26/2017   HCT 40.7 06/26/2017   PLT 236 06/26/2017   GLUCOSE 95 07/24/2017   CHOL 156 04/10/2017   TRIG 162.0 (H) 04/10/2017   HDL 52.00 04/10/2017   LDLDIRECT 166.0 03/18/2015   LDLCALC 72 04/10/2017   ALT 17 06/26/2017   AST 23 06/26/2017   NA 139 07/24/2017   K 4.2 07/24/2017   CL 104 07/24/2017   CREATININE 1.00 07/24/2017   BUN 13 07/24/2017   CO2 27 07/24/2017   TSH 4.42 04/10/2017   INR 2.2 (H) 07/17/2017   HGBA1C 6.0 04/10/2017       Assessment & Plan:   Problem List Items Addressed This Visit    Asthma    Breathing stable.        Essential hypertension, benign - Primary    Blood pressure under good control.  Continue same medication regimen.  Follow pressures.  Follow metabolic panel.        Relevant Orders   Basic metabolic panel (Completed)   History of blood clots    On coumadin.  Follow pt/inr.       Hypercholesterolemia    Unclear if crestor contributing to her aching.  Will have her temporarily stop the medication and see if symptoms resolve.  Low cholesterol diet and exercise.  Follow lipid panel.        Hyperglycemia    Low carb diet and exercise.  Follow met b and a1c.        Muscle fatigue    Will stop crestor as outlined.  Check ck and esr.        Relevant Orders   Sedimentation rate (Completed)   CK (Creatine Kinase) (Completed)       Einar Pheasant, MD

## 2017-07-28 ENCOUNTER — Encounter: Payer: Self-pay | Admitting: Internal Medicine

## 2017-07-28 NOTE — Assessment & Plan Note (Signed)
Unclear if crestor contributing to her aching.  Will have her temporarily stop the medication and see if symptoms resolve.  Low cholesterol diet and exercise.  Follow lipid panel.

## 2017-07-28 NOTE — Assessment & Plan Note (Signed)
On coumadin.  Follow pt/inr.  

## 2017-07-28 NOTE — Assessment & Plan Note (Signed)
Breathing stable.

## 2017-07-28 NOTE — Assessment & Plan Note (Signed)
Low carb diet and exercise.  Follow met b and a1c.   

## 2017-07-28 NOTE — Assessment & Plan Note (Signed)
Will stop crestor as outlined.  Check ck and esr.

## 2017-07-28 NOTE — Assessment & Plan Note (Signed)
Blood pressure under good control.  Continue same medication regimen.  Follow pressures.  Follow metabolic panel.   

## 2017-08-13 ENCOUNTER — Other Ambulatory Visit: Payer: Self-pay

## 2017-08-13 MED ORDER — EZETIMIBE 10 MG PO TABS: 10.0000 mg | ORAL_TABLET | Freq: Every day | ORAL | 0 refills | Status: DC

## 2017-08-14 ENCOUNTER — Other Ambulatory Visit: Payer: Medicare Other

## 2017-08-14 ENCOUNTER — Ambulatory Visit (INDEPENDENT_AMBULATORY_CARE_PROVIDER_SITE_OTHER): Payer: Medicare Other

## 2017-08-14 ENCOUNTER — Ambulatory Visit: Payer: Medicare Other

## 2017-08-14 VITALS — BP 124/76 | HR 82 | Temp 98.3°F | Resp 17 | Ht 63.0 in | Wt 234.1 lb

## 2017-08-14 DIAGNOSIS — Z0000 Encounter for general adult medical examination without abnormal findings: Secondary | ICD-10-CM

## 2017-08-14 DIAGNOSIS — Z7901 Long term (current) use of anticoagulants: Secondary | ICD-10-CM | POA: Diagnosis not present

## 2017-08-14 DIAGNOSIS — Z1159 Encounter for screening for other viral diseases: Secondary | ICD-10-CM | POA: Diagnosis not present

## 2017-08-14 LAB — PROTIME-INR
INR: 2.5 ratio — ABNORMAL HIGH (ref 0.8–1.0)
Prothrombin Time: 28.7 s — ABNORMAL HIGH (ref 9.6–13.1)

## 2017-08-14 NOTE — Progress Notes (Signed)
Subjective:   Tina Duarte is a 72 y.o. female who presents for an Initial Medicare Annual Wellness Visit.  Review of Systems    No ROS.  Medicare Wellness Visit. Additional risk factors are reflected in the social history.  Cardiac Risk Factors include: advanced age (>23men, >55 women);hypertension     Objective:    Today's Vitals   08/14/17 1140 08/14/17 1157  BP: 124/76   Pulse: 82   Resp: 17   Temp: 98.3 F (36.8 C)   TempSrc: Oral   SpO2: 94%   Weight: 234 lb 1.9 oz (106.2 kg)   Height: 5\' 3"  (1.6 m)   PainSc:  1    Body mass index is 41.47 kg/m.  Advanced Directives 08/14/2017 04/08/2015 08/19/2014  Does Patient Have a Medical Advance Directive? No No No  Would patient like information on creating a medical advance directive? Yes (MAU/Ambulatory/Procedural Areas - Information given) No - patient declined information Yes - Educational materials given    Current Medications (verified) Outpatient Encounter Medications as of 08/14/2017  Medication Sig  . acetaminophen (TYLENOL) 650 MG CR tablet Take 650 mg by mouth every 8 (eight) hours as needed for pain.  . citalopram (CELEXA) 20 MG tablet TAKE 1 TABLET BY MOUTH ONCE A DAY  . ezetimibe (ZETIA) 10 MG tablet Take 1 tablet (10 mg total) by mouth daily.  Marland Kitchen losartan (COZAAR) 50 MG tablet TAKE ONE (1) TABLET EACH DAY  . nystatin (MYCOSTATIN) 100000 UNIT/ML suspension Take 5 mLs (500,000 Units total) by mouth 3 (three) times daily.  Marland Kitchen nystatin (NYSTATIN) powder Apply topically 4 (four) times daily.  Marland Kitchen nystatin cream (MYCOSTATIN) Apply 1 application topically 2 (two) times daily.  Marland Kitchen warfarin (COUMADIN) 5 MG tablet TAKE ONE (1) TABLET EACH DAY  . [DISCONTINUED] rosuvastatin (CRESTOR) 5 MG tablet TAKE 1 TABLET BY MOUTH ONCE A DAY   No facility-administered encounter medications on file as of 08/14/2017.     Allergies (verified) Patient has no known allergies.   History: Past Medical History:  Diagnosis Date  . Allergy     . Asthma   . Chicken pox   . History of blood clots   . Hypercholesterolemia   . Hypertension    Past Surgical History:  Procedure Laterality Date  . blood clots  2008   Family History  Problem Relation Age of Onset  . Cancer Mother        Breast  . Heart disease Mother   . Stroke Mother   . Hypertension Mother   . Breast cancer Mother        57's  . Heart disease Father   . Stroke Father   . Hypertension Father   . Diabetes Father   . Colon cancer Unknown        paternal cousin   Social History   Socioeconomic History  . Marital status: Widowed    Spouse name: Not on file  . Number of children: Not on file  . Years of education: Not on file  . Highest education level: Not on file  Occupational History  . Not on file  Social Needs  . Financial resource strain: Not on file  . Food insecurity:    Worry: Not on file    Inability: Not on file  . Transportation needs:    Medical: Not on file    Non-medical: Not on file  Tobacco Use  . Smoking status: Never Smoker  . Smokeless tobacco: Never Used  Substance  and Sexual Activity  . Alcohol use: No    Alcohol/week: 0.0 oz  . Drug use: No  . Sexual activity: Not on file  Lifestyle  . Physical activity:    Days per week: Not on file    Minutes per session: Not on file  . Stress: Not on file  Relationships  . Social connections:    Talks on phone: Not on file    Gets together: Not on file    Attends religious service: Not on file    Active member of club or organization: Not on file    Attends meetings of clubs or organizations: Not on file    Relationship status: Not on file  Other Topics Concern  . Not on file  Social History Narrative  . Not on file    Tobacco Counseling Counseling given: Not Answered   Clinical Intake:  Pre-visit preparation completed: Yes  Pain : 0-10 Pain Score: 1  Pain Type: Chronic pain Pain Location: Back Pain Descriptors / Indicators: Discomfort, Dull Pain Relieving  Factors: Tylenol Effect of Pain on Daily Activities: Paces herself between activities.   Pain Relieving Factors: Tylenol  Nutritional Status: BMI > 30  Obese Diabetes: No  How often do you need to have someone help you when you read instructions, pamphlets, or other written materials from your doctor or pharmacy?: 1 - Never  Interpreter Needed?: No      Activities of Daily Living In your present state of health, do you have any difficulty performing the following activities: 08/14/2017  Hearing? N  Vision? N  Difficulty concentrating or making decisions? N  Walking or climbing stairs? Y  Comment Knee pain, intermittent  Dressing or bathing? N  Doing errands, shopping? Y  Comment She does not Physiological scientist and eating ? N  Using the Toilet? N  In the past six months, have you accidently leaked urine? Y  Comment Managed with a daily pad  Do you have problems with loss of bowel control? N  Managing your Medications? N  Managing your Finances? N  Housekeeping or managing your Housekeeping? N  Some recent data might be hidden     Immunizations and Health Maintenance Immunization History  Administered Date(s) Administered  . Influenza, High Dose Seasonal PF 04/10/2017  . Influenza,inj,Quad PF,6+ Mos 10/21/2012, 01/07/2014, 03/07/2016  . Influenza,inj,quad, With Preservative 03/02/2016  . Influenza-Unspecified 02/28/2011, 12/16/2014  . Pneumococcal Conjugate-13 04/30/2010  . Pneumococcal Polysaccharide-23 05/23/2016   Health Maintenance Due  Topic Date Due  . MAMMOGRAM  03/14/2017    Patient Care Team: Einar Pheasant, MD as PCP - General (Internal Medicine)  Indicate any recent Medical Services you may have received from other than [facility] providers in the past year (date may be approximate).     Assessment:   This is a routine wellness examination for [name].  The goal of the wellness visit is to assist the patient how to close the gaps in care and create a  preventative care plan for the patient.   The roster of all physicians providing medical care to patient is listed in the Snapshot section of the chart.  Osteoporosis risk reviewed.    Safety issues reviewed; Smoke and carbon monoxide detectors in the home. No firearms in the home. Wears seatbelts when riding with others. No violence in the home.  They do not have excessive sun exposure.  Discussed the need for sun protection: hats, long sleeves and the use of sunscreen if there is significant  sun exposure.  Patient is alert, normal appearance, oriented to person/place/and time. Correctly identified the president of the [place] and recalls of 2/3 words. Performs simple calculations and can read correct time from watch face. Displays appropriate judgement.  No new identified risk were noted.  No failures at ADL's or IADL's.  Ambulates with walker as needed for knee pain. Managed with tylenol.    BMI- discussed the importance of a healthy diet, water intake and the benefits of aerobic exercise. Educational material provided.   24 hour diet recall: Moderate diet.  Sleep patterns- Sleeps without issues through the night.   Mammogram discussed.   Hepatitis C screening consent given.  PT/INR drawn today.   Patient Concerns: None at this time. Follow up with PCP as needed.  Hearing/Vision screen Hearing Screening Comments: Patient has difficulty hearing conversational tones in a crowd.   Followed by Dr. Pryor Ochoa.   She does not wear hearing aids due to cost.  Information provided for assistance.   Vision Screening Comments: Followed by [facility] Wears corrective lenses when reading Visual acuity not assessed per patient preference   Dietary issues and exercise activities discussed: Current Exercise Habits: The patient does not participate in regular exercise at present  Goals    . DIET - INCREASE WATER INTAKE     Stay hydrated.    . Increase physical activity     Walk  the treadmill daily.  Increase time as tolerated.      Depression Screen PHQ 2/9 Scores 08/14/2017 12/21/2015 09/15/2014 10/21/2012  PHQ - 2 Score 0 0 0 0    Fall Risk Fall Risk  08/14/2017 12/21/2015 09/15/2014 10/21/2012  Falls in the past year? No No Yes Yes  Number falls in past yr: - - 2 or more 1  Injury with Fall? - - No No    Cognitive Function:     6CIT Screen 08/14/2017  What Year? 0 points  What month? 0 points  What time? 0 points  Count back from 20 0 points  Months in reverse 0 points  Repeat phrase 0 points  Total Score 0    Screening Tests Health Maintenance  Topic Date Due  . MAMMOGRAM  03/14/2017  . INFLUENZA VACCINE  08/30/2017  . TETANUS/TDAP  02/08/2020  . COLONOSCOPY  04/29/2020  . DEXA SCAN  Completed  . Hepatitis C Screening  Completed  . PNA vac Low Risk Adult  Completed     Plan:    End of life planning; Advance aging; Advanced directives discussed. Copy of current HCPOA/Living Will requested once completed.   I have personally reviewed and noted the following in the patient's chart:   . Medical and social history . Use of alcohol, tobacco or illicit drugs  . Current medications and supplements . Functional ability and status . Nutritional status . Physical activity . Advanced directives . List of other physicians . Hospitalizations, surgeries, and ER visits in previous 12 months . Vitals . Screenings to include cognitive, depression, and falls . Referrals and appointments  In addition, I have reviewed and discussed with patient certain preventive protocols, quality metrics, and best practice recommendations. A written personalized care plan for preventive services as well as general preventive health recommendations were provided to patient.     Varney Biles, LPN   [id number]

## 2017-08-14 NOTE — Patient Instructions (Addendum)
.  [name] , Thank you for taking time to come for your Medicare Wellness Visit. I appreciate your ongoing commitment to your health goals. Please review the following plan we discussed and let me know if I can assist you in the future.   Follow up as needed.    Bring a copy of your [place] and/or Living Will to be scanned into chart once completed. .  Have a great day!  These are the goals we discussed: Goals    . DIET - INCREASE WATER INTAKE     Stay hydrated.    . Increase physical activity     Walk the treadmill daily.  Increase time as tolerated.       This is a list of the screening recommended for you and due dates:  Health Maintenance  Topic Date Due  . Mammogram  03/14/2017  . Flu Shot  08/30/2017  . Tetanus Vaccine  02/08/2020  . Colon Cancer Screening  04/29/2020  . DEXA scan (bone density measurement)  Completed  .  Hepatitis C: One time screening is recommended by Center for Disease Control  (CDC) for  adults born from 18 through 1967.   Completed  . Pneumonia vaccines  Completed    Hearing Loss Hearing loss is a partial or total loss of the ability to hear. This can be temporary or permanent, and it can happen in one or both ears. Hearing loss may be referred to as deafness. Medical care is necessary to treat hearing loss properly and to prevent the condition from getting worse. Your hearing may partially or completely come back, depending on what caused your hearing loss and how severe it is. In some cases, hearing loss is permanent. What are the causes? Common causes of hearing loss include:  Too much wax in the ear canal.  Infection of the ear canal or middle ear.  Fluid in the middle ear.  Injury to the ear or surrounding area.  An object stuck in the ear.  Prolonged exposure to loud sounds, such as music.  Less common causes of hearing loss include:  Tumors in the ear.  Viral or bacterial infections, such as  meningitis.  A hole in the eardrum (perforated eardrum).  Problems with the hearing nerve that sends signals between the brain and the ear.  Certain medicines.  What are the signs or symptoms? Symptoms of this condition may include:  Difficulty telling the difference between sounds.  Difficulty following a conversation when there is background noise.  Lack of response to sounds in your environment. This may be most noticeable when you do not respond to startling sounds.  Needing to turn up the volume on the television, radio, etc.  Ringing in the ears.  Dizziness.  Pain in the ears.  How is this diagnosed? This condition is diagnosed based on a physical exam and a hearing test (audiometry). The audiometry test will be performed by a hearing specialist (audiologist). You may also be referred to an ear, nose, and throat (ENT) specialist (otolaryngologist). How is this treated? Treatment for recent onset of hearing loss may include:  Ear wax removal.  Being prescribed medicines to prevent infection (antibiotics).  Being prescribed medicines to reduce inflammation (corticosteroids).  Follow these instructions at home:  If you were prescribed an antibiotic medicine, take it as told by your health care provider. Do not stop taking the antibiotic even if you start to feel better.  Take over-the-counter and prescription  medicines only as told by your health care provider.  Avoid loud noises.  Return to your normal activities as told by your health care provider. Ask your health care provider what activities are safe for you.  Keep all follow-up visits as told by your health care provider. This is important. Contact a health care provider if:  You feel dizzy.  You develop new symptoms.  You vomit or feel nauseous.  You have a fever. Get help right away if:  You develop sudden changes in your vision.  You have severe ear pain.  You have new or increased  weakness.  You have a severe headache. This information is not intended to replace advice given to you by your health care provider. Make sure you discuss any questions you have with your health care provider. Document Released: 01/16/2005 Document Revised: 06/24/2015 Document Reviewed: 06/03/2014 Elsevier Interactive Patient Education  2018 [employer].

## 2017-08-15 ENCOUNTER — Other Ambulatory Visit: Payer: Medicare Other

## 2017-08-15 LAB — HEPATITIS C ANTIBODY
Hepatitis C Ab: NONREACTIVE
SIGNAL TO CUT-OFF: 0.02 (ref <1.00)

## 2017-08-19 ENCOUNTER — Encounter: Payer: Self-pay | Admitting: Emergency Medicine

## 2017-08-19 ENCOUNTER — Emergency Department
Admission: EM | Admit: 2017-08-19 | Discharge: 2017-08-20 | Disposition: A | Discharge status: Home / Self Care | Payer: Medicare Other | Attending: Emergency Medicine | Admitting: Emergency Medicine

## 2017-08-19 ENCOUNTER — Other Ambulatory Visit: Payer: Self-pay

## 2017-08-19 ENCOUNTER — Emergency Department: Payer: Medicare Other

## 2017-08-19 DIAGNOSIS — R51 Headache: Secondary | ICD-10-CM | POA: Diagnosis not present

## 2017-08-19 DIAGNOSIS — H81391 Other peripheral vertigo, right ear: Secondary | ICD-10-CM

## 2017-08-19 DIAGNOSIS — J45909 Unspecified asthma, uncomplicated: Secondary | ICD-10-CM | POA: Diagnosis not present

## 2017-08-19 DIAGNOSIS — Z7901 Long term (current) use of anticoagulants: Secondary | ICD-10-CM | POA: Insufficient documentation

## 2017-08-19 DIAGNOSIS — R42 Dizziness and giddiness: Secondary | ICD-10-CM | POA: Diagnosis not present

## 2017-08-19 DIAGNOSIS — Z79899 Other long term (current) drug therapy: Secondary | ICD-10-CM | POA: Insufficient documentation

## 2017-08-19 DIAGNOSIS — I10 Essential (primary) hypertension: Secondary | ICD-10-CM | POA: Diagnosis not present

## 2017-08-19 DIAGNOSIS — R1110 Vomiting, unspecified: Secondary | ICD-10-CM | POA: Insufficient documentation

## 2017-08-19 LAB — DIFFERENTIAL
BASOS ABS: 0.1 10*3/uL (ref 0–0.1)
BASOS PCT: 1 %
EOS ABS: 0.3 10*3/uL (ref 0–0.7)
Eosinophils Relative: 4 %
Lymphocytes Relative: 43 %
Lymphs Abs: 3.2 10*3/uL (ref 1.0–3.6)
Monocytes Absolute: 0.5 10*3/uL (ref 0.2–0.9)
Monocytes Relative: 7 %
Neutro Abs: 3.4 10*3/uL (ref 1.4–6.5)
Neutrophils Relative %: 45 %

## 2017-08-19 LAB — URINALYSIS, ROUTINE W REFLEX MICROSCOPIC
BILIRUBIN URINE: NEGATIVE
GLUCOSE, UA: NEGATIVE mg/dL
Hgb urine dipstick: NEGATIVE
KETONES UR: 20 mg/dL — AB
LEUKOCYTES UA: NEGATIVE
NITRITE: NEGATIVE
PROTEIN: NEGATIVE mg/dL
Specific Gravity, Urine: 1.014 (ref 1.005–1.030)
pH: 6 (ref 5.0–8.0)

## 2017-08-19 LAB — APTT: APTT: 35 s (ref 24–36)

## 2017-08-19 LAB — COMPREHENSIVE METABOLIC PANEL
ALBUMIN: 4.1 g/dL (ref 3.5–5.0)
ALT: 13 U/L (ref 0–44)
AST: 31 U/L (ref 15–41)
Alkaline Phosphatase: 51 U/L (ref 38–126)
Anion gap: 12 (ref 5–15)
BUN: 15 mg/dL (ref 8–23)
CHLORIDE: 104 mmol/L (ref 98–111)
CO2: 23 mmol/L (ref 22–32)
CREATININE: 1.21 mg/dL — AB (ref 0.44–1.00)
Calcium: 8.8 mg/dL — ABNORMAL LOW (ref 8.9–10.3)
GFR calc Af Amer: 50 mL/min — ABNORMAL LOW (ref >60)
GFR calc non Af Amer: 43 mL/min — ABNORMAL LOW (ref >60)
Glucose, Bld: 121 mg/dL — ABNORMAL HIGH (ref 70–99)
POTASSIUM: 3.9 mmol/L (ref 3.5–5.1)
Sodium: 139 mmol/L (ref 135–145)
Total Bilirubin: 0.8 mg/dL (ref 0.3–1.2)
Total Protein: 7.2 g/dL (ref 6.5–8.1)

## 2017-08-19 LAB — URINE DRUG SCREEN, QUALITATIVE (ARMC ONLY)
AMPHETAMINES, UR SCREEN: NOT DETECTED
Benzodiazepine, Ur Scrn: NOT DETECTED
COCAINE METABOLITE, UR ~~LOC~~: NOT DETECTED
Cannabinoid 50 Ng, Ur ~~LOC~~: NOT DETECTED
MDMA (ECSTASY) UR SCREEN: NOT DETECTED
Methadone Scn, Ur: NOT DETECTED
OPIATE, UR SCREEN: NOT DETECTED
PHENCYCLIDINE (PCP) UR S: NOT DETECTED
Tricyclic, Ur Screen: NOT DETECTED

## 2017-08-19 LAB — CBC
HEMATOCRIT: 41.1 % (ref 35.0–47.0)
HEMOGLOBIN: 14.1 g/dL (ref 12.0–16.0)
MCH: 30 pg (ref 26.0–34.0)
MCHC: 34.2 g/dL (ref 32.0–36.0)
MCV: 87.7 fL (ref 80.0–100.0)
Platelets: 246 10*3/uL (ref 150–440)
RBC: 4.69 MIL/uL (ref 3.80–5.20)
RDW: 14.1 % (ref 11.5–14.5)
WBC: 7.4 10*3/uL (ref 3.6–11.0)

## 2017-08-19 LAB — PROTIME-INR
INR: 2.81
Prothrombin Time: 29.4 seconds — ABNORMAL HIGH (ref 11.4–15.2)

## 2017-08-19 LAB — TROPONIN I: Troponin I: <0.03 ng/mL (ref <0.03)

## 2017-08-19 LAB — ETHANOL: Alcohol, Ethyl (B): <10 mg/dL (ref <10)

## 2017-08-19 MED ORDER — PROCHLORPERAZINE EDISYLATE 10 MG/2ML IJ SOLN
10.0000 mg | Freq: Once | INTRAMUSCULAR | Status: AC
  Administered 2017-08-19: 10 mg via INTRAVENOUS
  Filled 2017-08-19: qty 2

## 2017-08-19 NOTE — ED Triage Notes (Signed)
Pt arrives via [facility] with c/o dizziness and HA + N/V x 1 1/2 hour ago. EMS has BP of 164/83 and administered Zofran 4 mg.

## 2017-08-19 NOTE — ED Provider Notes (Signed)
[facility] Emergency Department Provider Note  ____________________________________________   First MD Initiated Contact with Patient 08/19/17 2250     (approximate)  I have reviewed the triage vital signs and the nursing notes.   HISTORY  Chief Complaint Dizziness and Code Stroke    HPI Tina Duarte is a 72 y.o. female who comes to the emergency department via EMS with roughly 90 minutes of sudden onset severe constant room spinning vertigo associated with nausea vomiting and a headache.  The symptoms began primarily with room spinning vertigo.  Her symptoms are severe and worsened when moving but even though rest improves her symptoms they do not completely resolved.  She denies tinnitus.  She denies numbness or weakness.  She denies chest pain shortness of breath.  She finds it difficult to open her eyes because it makes her "not feel right".  This is never happened to her before.   Past Medical History:  Diagnosis Date  . Allergy   . Asthma   . Chicken pox   . History of blood clots   . Hypercholesterolemia   . Hypertension     Patient Active Problem List   Diagnosis Date Noted  . Muscle fatigue 07/24/2017  . Hyperglycemia 09/12/2016  . Sinusitis 04/05/2015  . Sore throat 10/17/2014  . Health care maintenance 09/20/2014  . LOC (loss of consciousness) ([place]) 06/09/2014  . Heel pain 05/31/2014  . Long term current use of anticoagulant therapy 04/04/2014  . Leg pain 02/16/2014  . Back pain 12/28/2013  . Stress 09/07/2013  . Cough 04/16/2013  . Left hip pain 12/05/2012  . 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 Rib pain 08/28/2012  . Essential hypertension, benign 05/22/2012  . Hypercholesterolemia 05/22/2012  . Environmental allergies 05/22/2012  . Asthma 05/22/2012  . History of blood clots 05/22/2012  . Diarrhea 05/22/2012    Past Surgical History:  Procedure Laterality Date  . blood clots  2008    Prior to Admission medications     Medication Sig Start Date End Date Taking? Authorizing Provider  acetaminophen (TYLENOL) 650 MG CR tablet Take 650 mg by mouth every 8 (eight) hours as needed for pain.   Yes [provider]  citalopram (CELEXA) 20 MG tablet TAKE 1 TABLET BY MOUTH ONCE A DAY 07/10/17  Yes Einar Pheasant, MD  losartan (COZAAR) 50 MG tablet TAKE ONE (1) TABLET EACH DAY 06/04/17  Yes Einar Pheasant, MD  warfarin (COUMADIN) 5 MG tablet TAKE ONE (1) TABLET EACH DAY 06/04/17  Yes Einar Pheasant, MD  ezetimibe (ZETIA) 10 MG tablet Take 1 tablet (10 mg total) by mouth daily. Patient not taking: Reported on 08/20/2017 08/13/17   Einar Pheasant, MD  meclizine (ANTIVERT) 25 MG tablet Take 1 tablet (25 mg total) by mouth 3 (three) times daily as needed for dizziness. 08/20/17   Darel Hong, MD  nystatin (MYCOSTATIN) 100000 UNIT/ML suspension Take 5 mLs (500,000 Units total) by mouth 3 (three) times daily. Patient not taking: Reported on 08/20/2017 05/05/15   Einar Pheasant, MD  nystatin (NYSTATIN) powder Apply topically 4 (four) times daily. Patient not taking: Reported on 08/20/2017 04/10/17   Einar Pheasant, MD  nystatin cream (MYCOSTATIN) Apply 1 application topically 2 (two) times daily. Patient not taking: Reported on 08/20/2017 04/10/17   Einar Pheasant, MD    Allergies Patient has no known allergies.  Family History  Problem Relation Age of Onset  . Cancer Mother        Breast  .  Heart disease Mother   . Stroke Mother   . Hypertension Mother   . Breast cancer Mother        23's  . Heart disease Father   . Stroke Father   . Hypertension Father   . Diabetes Father   . Colon cancer Unknown        paternal cousin    Social History Social History   Tobacco Use  . Smoking status: Never Smoker  . Smokeless tobacco: Never Used  Substance Use Topics  . Alcohol use: No    Alcohol/week: 0.0 oz  . Drug use: No    Review of Systems Constitutional: No fever/chills Eyes: No visual changes. ENT:  No sore throat. Cardiovascular: Denies chest pain. Respiratory: Denies shortness of breath. Gastrointestinal: No abdominal pain.  Positive for nausea, positive for vomiting.  No diarrhea.  No constipation. Genitourinary: Negative for dysuria. Musculoskeletal: Negative for back pain. Skin: Negative for rash. Neurological: Positive for headache   ____________________________________________   PHYSICAL EXAM:  VITAL SIGNS: ED Triage Vitals  Enc Vitals Group     BP      Pulse      Resp      Temp      Temp src      SpO2      Weight      Height      Head Circumference      Peak Flow      Pain Score      Pain Loc      Pain Edu?      Excl. in [place]?     Constitutional: Alert and oriented x4 appears miserable holding her eyes closed tight retching and vomiting Eyes: PERRL EOMI. pupils are midrange and brisk.  Lateral nystagmus primarily on rightward gaze Head: Atraumatic.  Normal tympanic membranes bilaterally Nose: No congestion/rhinnorhea. Mouth/Throat: No trismus Neck: No stridor.   Cardiovascular: Normal rate, regular rhythm. Grossly normal heart sounds.  Good peripheral circulation. Respiratory: Normal respiratory effort.  No retractions. Lungs CTAB and moving good air Gastrointestinal: Obese soft nontender Musculoskeletal: No lower extremity edema   Neurologic:  Normal speech and language.  Abnormal finger-nose-finger bilaterally Physical exam is extremely difficult to perform at this point secondary to the patient's nausea vomiting and vertigo Skin:  Skin is warm, dry and intact. No rash noted. Psychiatric: Anxious appearing and quite distressed    ____________________________________________   DIFFERENTIAL includes but not limited to  Cerebellar stroke, labyrinthitis, BPPV, dehydration, arrhythmia ____________________________________________   LABS (all labs ordered are listed, but only abnormal results are displayed)  Labs Reviewed  PROTIME-INR - Abnormal;  Notable for the following components:      Result Value   Prothrombin Time 29.4 (*)    All other components within normal limits  COMPREHENSIVE METABOLIC PANEL - Abnormal; Notable for the following components:   Glucose, Bld 121 (*)    Creatinine, Ser 1.21 (*)    Calcium 8.8 (*)    GFR calc non Af Amer 43 (*)    GFR calc Af Amer 50 (*)    All other components within normal limits  URINE DRUG SCREEN, QUALITATIVE ([facility] ONLY) - Abnormal; Notable for the following components:   Barbiturates, Ur Screen   (*)    Value: Result not available. Reagent lot number recalled by manufacturer.   All other components within normal limits  URINALYSIS, ROUTINE W REFLEX MICROSCOPIC - Abnormal; Notable for the following components:   Color, Urine YELLOW (*)  APPearance CLEAR (*)    Ketones, ur 20 (*)    All other components within normal limits  APTT  CBC  DIFFERENTIAL  TROPONIN I  ETHANOL    Lab work reviewed by me with no acute disease __________________________________________  EKG  ED ECG REPORT I, Darel Hong, the attending physician, personally viewed and interpreted this ECG.  Date: 08/19/2017 EKG Time:  Rate: 63 Rhythm: normal sinus rhythm QRS Axis: normal Intervals: normal ST/T Wave abnormalities: Mild ST depression in V3 and V4 with no reciprocals.  This is very similar to previous EKG 06/26/2017 Narrative Interpretation: no evidence of acute ischemia  ____________________________________________  RADIOLOGY  CT scan of the head code stroke protocol reviewed by me with no acute disease MRI of the brain reviewed by me with no evidence of acute ischemia ____________________________________________   PROCEDURES  Procedure(s) performed: no  Procedures  Critical Care performed: Yes  ____________________________________________   INITIAL IMPRESSION / ASSESSMENT AND PLAN / ED COURSE  Pertinent labs & imaging results that were available during my care of the patient  were reviewed by me and considered in my medical decision making (see chart for details).   The patient arrives with 90 minutes of constant room spinning vertigo.  Exam is challenging secondary to the patient's severe symptoms however she does appear to have an abnormal finger-nose-finger raising concern for cerebellar stroke.  Code stroke activated as the patient would be within the window for TPA at this point.  She is VAN negative and I don't suspect an LVO.     ----------------------------------------- 12:11 AM on 08/20/2017 -----------------------------------------  I spoke with the stroke neurologist who recommends against TPA.  He feels that the patient's symptoms are most likely peripheral in etiology however he recommends an MRI of the brain as well as an MRA of the head and neck.  Regardless of the results of the MRI he recommends inpatient admission for vestibular rehabilitation given the severity of her symptoms.  I will give her a single aspirin for now as well as 2 mg of lorazepam for dizziness and claustrophobia. ____________________________________________   ----------------------------------------- 6:13 AM on 08/20/2017 ----------------------------------------- The patient feels significantly improved and her MRI is reassuring.  This likely is peripheral vertigo and I will refer her to otolaryngology as an outpatient with meclizine for symptomatic treatment.    FINAL CLINICAL IMPRESSION(S) / ED DIAGNOSES  Final diagnoses:  Peripheral vertigo involving right ear      NEW MEDICATIONS STARTED DURING THIS VISIT:  New Prescriptions   MECLIZINE (ANTIVERT) 25 MG TABLET    Take 1 tablet (25 mg total) by mouth 3 (three) times daily as needed for dizziness.     Note:  This document was prepared using Dragon voice recognition software and may include unintentional dictation errors.     Darel Hong, MD 08/20/17 [phone]

## 2017-08-20 ENCOUNTER — Emergency Department: Payer: Medicare Other

## 2017-08-20 DIAGNOSIS — R42 Dizziness and giddiness: Secondary | ICD-10-CM | POA: Diagnosis not present

## 2017-08-20 DIAGNOSIS — H81391 Other peripheral vertigo, right ear: Secondary | ICD-10-CM | POA: Diagnosis not present

## 2017-08-20 DIAGNOSIS — R51 Headache: Secondary | ICD-10-CM | POA: Diagnosis not present

## 2017-08-20 MED ORDER — KETOROLAC TROMETHAMINE 30 MG/ML IJ SOLN: 15.0000 mg | Freq: Once | INTRAMUSCULAR | Status: DC

## 2017-08-20 MED ORDER — MECLIZINE HCL 25 MG PO TABS: 25.0000 mg | ORAL_TABLET | Freq: Three times a day (TID) | ORAL | 0 refills | Status: DC | PRN

## 2017-08-20 MED ORDER — ASPIRIN 81 MG PO CHEW
324.0000 mg | CHEWABLE_TABLET | Freq: Once | ORAL | Status: AC
  Administered 2017-08-20: 324 mg via ORAL
  Filled 2017-08-20: qty 4

## 2017-08-20 MED ORDER — LORAZEPAM 2 MG/ML IJ SOLN
2.0000 mg | Freq: Once | INTRAMUSCULAR | Status: AC
  Administered 2017-08-20: 2 mg via INTRAVENOUS
  Filled 2017-08-20: qty 1

## 2017-08-20 NOTE — Discharge Instructions (Signed)
It was a pleasure to take care of you today, and thank you for coming to our emergency department.  If you have any questions or concerns before leaving please ask the nurse to grab me and I'm more than happy to go through your aftercare instructions again.  If you were prescribed any opioid pain medication today such as Norco, Vicodin, Percocet, morphine, hydrocodone, or oxycodone please make sure you do not drive when you are taking this medication as it can alter your ability to drive safely.  If you have any concerns once you are home that you are not improving or are in fact getting worse before you can make it to your follow-up appointment, please do not hesitate to call 911 and come back for further evaluation.  Darel Hong, MD  Results for orders placed or performed during the hospital encounter of 08/19/17  Protime-INR  Result Value Ref Range   Prothrombin Time 29.4 (H) 11.4 - 15.2 seconds   INR 2.81   APTT  Result Value Ref Range   aPTT 35 24 - 36 seconds  CBC  Result Value Ref Range   WBC 7.4 3.6 - 11.0 K/uL   RBC 4.69 3.80 - 5.20 MIL/uL   Hemoglobin 14.1 12.0 - 16.0 g/dL   HCT 41.1 35.0 - 47.0 %   MCV 87.7 80.0 - 100.0 fL   MCH 30.0 26.0 - 34.0 pg   MCHC 34.2 32.0 - 36.0 g/dL   RDW 14.1 11.5 - 14.5 %   Platelets 246 150 - 440 K/uL  Differential  Result Value Ref Range   Neutrophils Relative % 45 %   Neutro Abs 3.4 1.4 - 6.5 K/uL   Lymphocytes Relative 43 %   Lymphs Abs 3.2 1.0 - 3.6 K/uL   Monocytes Relative 7 %   Monocytes Absolute 0.5 0.2 - 0.9 K/uL   Eosinophils Relative 4 %   Eosinophils Absolute 0.3 0 - 0.7 K/uL   Basophils Relative 1 %   Basophils Absolute 0.1 0 - 0.1 K/uL  Comprehensive metabolic panel  Result Value Ref Range   Sodium 139 135 - 145 mmol/L   Potassium 3.9 3.5 - 5.1 mmol/L   Chloride 104 98 - 111 mmol/L   CO2 23 22 - 32 mmol/L   Glucose, Bld 121 (H) 70 - 99 mg/dL   BUN 15 8 - 23 mg/dL   Creatinine, Ser 1.21 (H) 0.44 - 1.00 mg/dL   Calcium 8.8 (L) 8.9 - 10.3 mg/dL   Total Protein 7.2 6.5 - 8.1 g/dL   Albumin 4.1 3.5 - 5.0 g/dL   AST 31 15 - 41 U/L   ALT 13 0 - 44 U/L   Alkaline Phosphatase 51 38 - 126 U/L   Total Bilirubin 0.8 0.3 - 1.2 mg/dL   GFR calc non Af Amer 43 (L) >60 mL/min   GFR calc Af Amer 50 (L) >60 mL/min   Anion gap 12 5 - 15  Troponin I  Result Value Ref Range   Troponin I <0.03 <0.03 ng/mL  Urine Drug Screen, Qualitative  Result Value Ref Range   Tricyclic, Ur Screen NONE DETECTED NONE DETECTED   Amphetamines, Ur Screen NONE DETECTED NONE DETECTED   MDMA (Ecstasy)Ur Screen NONE DETECTED NONE DETECTED   Cocaine Metabolite,Ur [place] NONE DETECTED NONE DETECTED   Opiate, Ur Screen NONE DETECTED NONE DETECTED   Phencyclidine (PCP) Ur S NONE DETECTED NONE DETECTED   Cannabinoid 50 Ng, Ur [place] NONE DETECTED NONE DETECTED   Barbiturates,  Ur Screen (A) NONE DETECTED    Result not available. Reagent lot number recalled by manufacturer.   Benzodiazepine, Ur Scrn NONE DETECTED NONE DETECTED   Methadone Scn, Ur NONE DETECTED NONE DETECTED  Urinalysis, Routine w reflex microscopic  Result Value Ref Range   Color, Urine YELLOW (A) YELLOW   APPearance CLEAR (A) CLEAR   Specific Gravity, Urine 1.014 1.005 - 1.030   pH 6.0 5.0 - 8.0   Glucose, UA NEGATIVE NEGATIVE mg/dL   Hgb urine dipstick NEGATIVE NEGATIVE   Bilirubin Urine NEGATIVE NEGATIVE   Ketones, ur 20 (A) NEGATIVE mg/dL   Protein, ur NEGATIVE NEGATIVE mg/dL   Nitrite NEGATIVE NEGATIVE   Leukocytes, UA NEGATIVE NEGATIVE  Ethanol  Result Value Ref Range   Alcohol, Ethyl (B) <10 <10 mg/dL   Mr Brain Wo Contrast (neuro Protocol)  Result Date: 08/20/2017 CLINICAL DATA:  Initial evaluation for acute dizziness, headache. EXAM: MRI HEAD WITHOUT CONTRAST MRA HEAD WITHOUT CONTRAST TECHNIQUE: Multiplanar, multiecho pulse sequences of the brain and surrounding structures were obtained without intravenous contrast. Angiographic images of the head were  obtained using MRA technique without contrast. COMPARISON:  Prior CT from 08/19/2017 FINDINGS: MRI HEAD FINDINGS Brain: Examination degraded by motion artifact. Generalized age appropriate cerebral atrophy. Minimal chronic microvascular ischemic changes present within the periventricular white matter. Probable tiny remote cortical infarct noted at the posterior right frontal region (series 17, image 36). No abnormal foci of restricted diffusion to suggest acute or subacute ischemia. Gray-white matter differentiation maintained. No other areas of chronic infarction. No evidence for acute or chronic intracranial hemorrhage. No mass lesion, midline shift or mass effect. No hydrocephalus. No extra-axial fluid collection. Pituitary gland within normal limits. Vascular: Major intracranial vascular flow voids maintained. Skull and upper cervical spine: Craniocervical junction normal. Upper cervical spine normal. Bone marrow signal intensity within normal limits. No scalp soft tissue abnormality. Sinuses/Orbits: Globes and orbital soft tissues demonstrate no acute finding. Left gaze noted. Paranasal sinuses are clear. Trace opacity bilateral mastoid air cells, of doubtful significance. Inner ear structures grossly normal. Other: None. MRA HEAD FINDINGS ANTERIOR CIRCULATION: Examination moderately degraded by motion artifact. Distal cervical segments of the internal carotid arteries are patent with antegrade flow. Petrous segments patent bilaterally. Cavernous and supraclinoid ICAs grossly patent without obvious stenosis. ICA termini poorly assessed. A1 segments grossly patent. Anterior communicating artery not well assessed. Anterior cerebral arteries patent distally, poorly evaluated proximally due to extensive motion. M1 segments grossly patent without obvious stenosis, although evaluation limited due to motion. MCA bifurcations not well assessed. Distal MCA branches perfused and symmetric POSTERIOR CIRCULATION: Vertebral  arteries patent to the vertebrobasilar junction. Atheromatous irregularity with mild stenosis within the distal right V4 segment. Left vertebral artery slightly dominant. Left PICA patent proximally. Right PICA not visualized. Basilar patent to its distal aspect without stenosis. Superior cerebral arteries grossly patent proximally. Both of the posterior cerebral arteries supplied via the basilar and are grossly patent proximally, not well assessed distally. No obvious intracranial aneurysm or other vascular abnormality. IMPRESSION: MRI HEAD IMPRESSION: 1. No acute intracranial abnormality identified. 2. 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 MRA HEAD IMPRESSION: 1. Severely limited study due to motion artifact. 2. Negative intracranial no obvious high-grade or correctable stenosis identified. Electronically Signed   By: Jeannine Boga M.D.   On: 08/20/2017 03:52   Mr Jodene Nam Head (cerebral Arteries)  Result Date: 08/20/2017 CLINICAL DATA:  Initial evaluation for acute dizziness, headache. EXAM:  MRI HEAD WITHOUT CONTRAST MRA HEAD WITHOUT CONTRAST TECHNIQUE: Multiplanar, multiecho pulse sequences of the brain and surrounding structures were obtained without intravenous contrast. Angiographic images of the head were obtained using MRA technique without contrast. COMPARISON:  Prior CT from 08/19/2017 FINDINGS: MRI HEAD FINDINGS Brain: Examination degraded by motion artifact. Generalized age appropriate cerebral atrophy. Minimal chronic microvascular ischemic changes present within the periventricular white matter. Probable tiny remote cortical infarct noted at the posterior right frontal region (series 17, image 36). No abnormal foci of restricted diffusion to suggest acute or subacute ischemia. Gray-white matter differentiation maintained. No other areas of chronic infarction. No evidence for acute or chronic intracranial hemorrhage. No mass lesion,  midline shift or mass effect. No hydrocephalus. No extra-axial fluid collection. Pituitary gland within normal limits. Vascular: Major intracranial vascular flow voids maintained. Skull and upper cervical spine: Craniocervical junction normal. Upper cervical spine normal. Bone marrow signal intensity within normal limits. No scalp soft tissue abnormality. Sinuses/Orbits: Globes and orbital soft tissues demonstrate no acute finding. Left gaze noted. Paranasal sinuses are clear. Trace opacity bilateral mastoid air cells, of doubtful significance. Inner ear structures grossly normal. Other: None. MRA HEAD FINDINGS ANTERIOR CIRCULATION: Examination moderately degraded by motion artifact. Distal cervical segments of the internal carotid arteries are patent with antegrade flow. Petrous segments patent bilaterally. Cavernous and supraclinoid ICAs grossly patent without obvious stenosis. ICA termini poorly assessed. A1 segments grossly patent. Anterior communicating artery not well assessed. Anterior cerebral arteries patent distally, poorly evaluated proximally due to extensive motion. M1 segments grossly patent without obvious stenosis, although evaluation limited due to motion. MCA bifurcations not well assessed. Distal MCA branches perfused and symmetric POSTERIOR CIRCULATION: Vertebral arteries patent to the vertebrobasilar junction. Atheromatous irregularity with mild stenosis within the distal right V4 segment. Left vertebral artery slightly dominant. Left PICA patent proximally. Right PICA not visualized. Basilar patent to its distal aspect without stenosis. Superior cerebral arteries grossly patent proximally. Both of the posterior cerebral arteries supplied via the basilar and are grossly patent proximally, not well assessed distally. No obvious intracranial aneurysm or other vascular abnormality. IMPRESSION: MRI HEAD IMPRESSION: 1. No acute intracranial abnormality identified. 2. Mild for age chronic small vessel  ischemic disease with small remote right frontal cortical infarct. MRA HEAD IMPRESSION: 1. Severely limited study due to motion artifact. 2. Negative intracranial no obvious high-grade or correctable stenosis identified. Electronically Signed   By: Jeannine Boga M.D.   On: 08/20/2017 03:52   Ct Head Code Stroke Wo Contrast  Result Date: 08/19/2017 CLINICAL DATA:  Code stroke. Initial evaluation for acute headache, nausea vomiting. EXAM: CT HEAD WITHOUT CONTRAST TECHNIQUE: Contiguous axial images were obtained from the base of the skull through the vertex without intravenous contrast. COMPARISON:  Prior CT from 03/28/2011. FINDINGS: Brain: Age-related cerebral atrophy. No acute intracranial hemorrhage. No acute large vessel territory infarct. No mass lesion, midline shift or mass effect. No hydrocephalus. No extra-axial fluid collection. Vascular: No asymmetric hyperdense vessel. Scattered vascular calcifications noted within the carotid siphons. Skull: Scalp soft tissues and calvarium within normal limits. Sinuses/Orbits: Globes and orbital soft tissues demonstrate no acute finding. Left gaze noted. Mild mucosal thickening within the right maxillary sinus. Paranasal sinuses are otherwise clear. No mastoid effusion. ASPECTS [facility] Stroke Program Early CT Score) - Ganglionic level infarction (caudate, lentiform nuclei, internal capsule, insula, M1-M3 cortex): 7 - Supraganglionic infarction (M4-M6 cortex): 3 Total score (0-10 with 10 being normal): 10 IMPRESSION: 1. No acute intracranial infarct or other abnormality identified. 2. ASPECTS  is 10. Critical Value/emergent results were called by telephone at the time of interpretation on 08/19/2017 at 11:30 pm to Dr. Darel Hong , who verbally acknowledged these results. Electronically Signed   By: Jeannine Boga M.D.   On: 08/19/2017 23:33

## 2017-08-20 NOTE — Progress Notes (Signed)
   08/20/17 0000  Clinical Encounter Type  Visited With Patient and family together  Visit Type Code   Chaplain remained with patient and son until assessment was completed.Chaplain received a page for a CODE stroke to the ED and maintained pastoral presence outside of room prior to entering. Patient was awake and responsive but experiencing sensitivity to light. She was nauseous and uncomfortable. Her son was at the bedside. Neurology was consulted via teleconference after CT scan; MRI was also ordered, and patient will be transported there for testing.

## 2017-08-20 NOTE — ED Notes (Signed)
Daughter at bedside for pt discharge, pt in wheelchair with daughter, NAD noted

## 2017-08-20 NOTE — ED Notes (Signed)
Called Son Ripon Uhde [phone] with no answer.

## 2017-08-20 NOTE — ED Notes (Signed)
Attempted to call pt's son x 2 with no answer.

## 2017-08-20 NOTE — Consult Note (Addendum)
   [facility] Tele[facility]urology Consult Services  Impression: vertigo, suspect peripheral, r/o posterior circulation stroke   Not a tpa candidate due to: out of therapeutic window, nih 0 Mri brain being done stat, mra head and neck have been added to r/o lvo   Differential Diagnosis:   1. Cardioembolic stroke  2. Small vessel disease/lacune  3. Thromboembolic, artery-to-artery mechanism  4. Hypercoagulable state-related infarct  5. Transient ischemic attack  6. Thrombotic mechanism, large artery disease   Comments:   [facility] contacted: [phone] [facility] at bedside: 2344 NIHSS assessment time:  2344  Recommendations:  Pending imaging- if mri brain negative for stroke admit for vestibular rehab If positive for stroke and with normal vessels, admit for stroke workup ASA now Neuro checks Dvt prophy Dysphagia screen Iv fluids ns Telemetry  inpatient neurology consultation Inpatient stroke evaluation as per Neurology/ Internal Medicine Discussed with ED MD  Addendum: Mri brain no acute stroke per my read mra head no basilar thrombosis per my read Wait for rad read Call with questions  -----------------------------------------------------------------------------------------  CC dizziness  History of Present Illness   Patient is a  72 year old woman who comes to the ED for dizziness, n/v. This started at 1900. Denies weakness, sensory loss. Per son, the patient had the same symptoms about a month ago, she was given something iv and sent home. MRI was not done at that time. The patient currently feels dizzy if she moves her head (cannot tell me if left or right is worse) and denies any spinning sensation. She does get nauseas when she is moved.  Diagnostic: Ct head without contrast- no acute findings per rad read  Exam:  NIHSS score: 0  1A: Level of Consciousness - Alert; keenly responsive 0 1B: Ask Month and Age - Both Questions Right 0 1C: 'Blink Eyes' &  'Squeeze Hands' - Performs Both Tasks 0 2: Test Horizontal Extraocular Movements - Normal 0 3: Test Visual Fields - No Visual Loss 0 4: Test Facial Palsy - Normal symmetry 0 5A: Test Left Arm Motor Drift - No Drift for 10 Seconds 0 5B: Test Right Arm Motor Drift - No Drift for 10 Seconds 0 6A: Test Left Leg Motor Drift - No Drift for 5 Seconds 0 6B: Test Right Leg Motor Drift - No Drift for 5 Seconds 0 7: Test Limb Ataxia - No Ataxia 0 8: Test Sensation - Normal; No sensory loss 0 9: Test Language/Aphasia - Normal; No aphasia 0 10: Test Dysarthria - Normal 0 11: Test Extinction/Inattention - No abnormality 0        Medical Decision Making:  - Extensive number of diagnosis or management options are considered above.   - Extensive amount of complex data reviewed.   - High risk of complication and/or morbidity or mortality are associated with differential diagnostic considerations above.  - There may be Uncertain outcome and increased probability of prolonged functional impairment or high probability of severe prolonged functional impairment associated with some of these differential diagnosis.  Medical Data Reviewed:  1.Data reviewed include clinical labs, radiology,  Medical Tests;   2.Tests results discussed w/performing or interpreting physician;   3.Obtaining/reviewing old medical records;  4.Obtaining case history from another source;  5.Independent review of image, tracing or specimen.    Patient was informed the Neurology Consult would happen via telehealth (remote video) and consented to receiving care in this manner.

## 2017-08-20 NOTE — ED Notes (Signed)
Attempted to call son and daughter with no success.

## 2017-08-20 NOTE — ED Notes (Signed)
Pt back from MRI at this time. Pt resting quietly in room in NAD.

## 2017-08-20 NOTE — ED Notes (Signed)
Called [place], pt's daughter with no answer.

## 2017-08-20 NOTE — ED Notes (Signed)
Daughter contacted at work at this time per pt, daughter states she is on her way

## 2017-08-31 DIAGNOSIS — M1711 Unilateral primary osteoarthritis, right knee: Secondary | ICD-10-CM | POA: Diagnosis not present

## 2017-08-31 DIAGNOSIS — M1712 Unilateral primary osteoarthritis, left knee: Secondary | ICD-10-CM | POA: Diagnosis not present

## 2017-09-11 ENCOUNTER — Other Ambulatory Visit: Payer: Self-pay | Admitting: Internal Medicine

## 2017-09-11 ENCOUNTER — Other Ambulatory Visit (INDEPENDENT_AMBULATORY_CARE_PROVIDER_SITE_OTHER): Payer: Medicare Other

## 2017-09-11 DIAGNOSIS — Z7901 Long term (current) use of anticoagulants: Secondary | ICD-10-CM | POA: Diagnosis not present

## 2017-09-11 LAB — PROTIME-INR
INR: 1.8 ratio — ABNORMAL HIGH (ref 0.8–1.0)
PROTHROMBIN TIME: 20.5 s — AB (ref 9.6–13.1)

## 2017-09-11 NOTE — Addendum Note (Signed)
Addended by: Elpidio Galea T on: 09/11/2017 10:06 AM   Modules accepted: Orders

## 2017-09-12 ENCOUNTER — Encounter: Payer: Self-pay | Admitting: *Deleted

## 2017-09-13 ENCOUNTER — Telehealth: Payer: Self-pay | Admitting: *Deleted

## 2017-09-13 DIAGNOSIS — Z7901 Long term (current) use of anticoagulants: Secondary | ICD-10-CM

## 2017-09-13 NOTE — Telephone Encounter (Signed)
See result note.  

## 2017-09-13 NOTE — Telephone Encounter (Signed)
Standing lab order placed for PT/INR 

## 2017-09-13 NOTE — Telephone Encounter (Signed)
This was in [facility] box.  See phone message.  Pt to stay on current coumadin dose and recheck pt/inr in one week.

## 2017-09-19 ENCOUNTER — Other Ambulatory Visit: Payer: Medicare Other

## 2017-11-07 ENCOUNTER — Other Ambulatory Visit: Payer: Self-pay

## 2017-11-07 NOTE — Patient Outreach (Signed)
[facility]) Care Management  11/07/2017  Jaycey Gens Bhola [birth year] [id number]   Medication Adherence call to Mrs. Montgomery Spader patient did not answer patient is due on Rosuvastatin 5 mg under [employer].  [place] Management Direct Dial [phone]  Fax [phone] [email or web address]

## 2017-11-13 ENCOUNTER — Ambulatory Visit (INDEPENDENT_AMBULATORY_CARE_PROVIDER_SITE_OTHER): Payer: Medicare Other | Admitting: Internal Medicine

## 2017-11-13 ENCOUNTER — Other Ambulatory Visit: Payer: Self-pay | Admitting: Internal Medicine

## 2017-11-13 ENCOUNTER — Encounter: Payer: Self-pay | Admitting: Internal Medicine

## 2017-11-13 ENCOUNTER — Ambulatory Visit (INDEPENDENT_AMBULATORY_CARE_PROVIDER_SITE_OTHER): Payer: Medicare Other

## 2017-11-13 VITALS — BP 136/88 | HR 82 | Temp 98.1°F | Resp 18 | Wt 230.4 lb

## 2017-11-13 DIAGNOSIS — J4520 Mild intermittent asthma, uncomplicated: Secondary | ICD-10-CM

## 2017-11-13 DIAGNOSIS — R739 Hyperglycemia, unspecified: Secondary | ICD-10-CM

## 2017-11-13 DIAGNOSIS — R059 Cough, unspecified: Secondary | ICD-10-CM

## 2017-11-13 DIAGNOSIS — I10 Essential (primary) hypertension: Secondary | ICD-10-CM

## 2017-11-13 DIAGNOSIS — Z7901 Long term (current) use of anticoagulants: Secondary | ICD-10-CM

## 2017-11-13 DIAGNOSIS — R053 Chronic cough: Secondary | ICD-10-CM

## 2017-11-13 DIAGNOSIS — E7800 Pure hypercholesterolemia, unspecified: Secondary | ICD-10-CM

## 2017-11-13 DIAGNOSIS — R05 Cough: Secondary | ICD-10-CM

## 2017-11-13 DIAGNOSIS — R0989 Other specified symptoms and signs involving the circulatory and respiratory systems: Secondary | ICD-10-CM | POA: Diagnosis not present

## 2017-11-13 DIAGNOSIS — M5442 Lumbago with sciatica, left side: Secondary | ICD-10-CM

## 2017-11-13 LAB — CBC WITH DIFFERENTIAL/PLATELET
BASOS ABS: 0.1 10*3/uL (ref 0.0–0.1)
BASOS PCT: 0.8 % (ref 0.0–3.0)
EOS ABS: 0.5 10*3/uL (ref 0.0–0.7)
Eosinophils Relative: 5.4 % — ABNORMAL HIGH (ref 0.0–5.0)
HEMATOCRIT: 44.8 % (ref 36.0–46.0)
Hemoglobin: 14.8 g/dL (ref 12.0–15.0)
LYMPHS ABS: 2.6 10*3/uL (ref 0.7–4.0)
Lymphocytes Relative: 28.3 % (ref 12.0–46.0)
MCHC: 33.1 g/dL (ref 30.0–36.0)
MCV: 88.3 fl (ref 78.0–100.0)
Monocytes Absolute: 0.7 10*3/uL (ref 0.1–1.0)
Monocytes Relative: 7.2 % (ref 3.0–12.0)
NEUTROS ABS: 5.3 10*3/uL (ref 1.4–7.7)
NEUTROS PCT: 58.3 % (ref 43.0–77.0)
PLATELETS: 321 10*3/uL (ref 150.0–400.0)
RBC: 5.08 Mil/uL (ref 3.87–5.11)
RDW: 14.2 % (ref 11.5–15.5)
WBC: 9.1 10*3/uL (ref 4.0–10.5)

## 2017-11-13 LAB — BASIC METABOLIC PANEL
BUN: 13 mg/dL (ref 6–23)
CO2: 29 meq/L (ref 19–32)
CREATININE: 1.04 mg/dL (ref 0.40–1.20)
Calcium: 9.4 mg/dL (ref 8.4–10.5)
Chloride: 101 mEq/L (ref 96–112)
GFR: 55.04 mL/min — AB (ref >60.00)
GLUCOSE: 96 mg/dL (ref 70–99)
Potassium: 4.4 mEq/L (ref 3.5–5.1)
SODIUM: 137 meq/L (ref 135–145)

## 2017-11-13 LAB — LIPID PANEL
CHOL/HDL RATIO: 4
CHOLESTEROL: 248 mg/dL — AB (ref 0–200)
HDL: 55.5 mg/dL (ref >39.00)
NonHDL: 192.14
TRIGLYCERIDES: 245 mg/dL — AB (ref 0.0–149.0)
VLDL: 49 mg/dL — AB (ref 0.0–40.0)

## 2017-11-13 LAB — HEPATIC FUNCTION PANEL
ALBUMIN: 4.3 g/dL (ref 3.5–5.2)
ALT: 19 U/L (ref 0–35)
AST: 22 U/L (ref 0–37)
Alkaline Phosphatase: 60 U/L (ref 39–117)
BILIRUBIN TOTAL: 0.9 mg/dL (ref 0.2–1.2)
Bilirubin, Direct: 0.2 mg/dL (ref 0.0–0.3)
Total Protein: 7.4 g/dL (ref 6.0–8.3)

## 2017-11-13 LAB — LDL CHOLESTEROL, DIRECT: Direct LDL: 174 mg/dL

## 2017-11-13 LAB — PROTIME-INR
INR: 1.8 ratio — ABNORMAL HIGH (ref 0.8–1.0)
PROTHROMBIN TIME: 20.6 s — AB (ref 9.6–13.1)

## 2017-11-13 LAB — HEMOGLOBIN A1C: Hgb A1c MFr Bld: 6 % (ref 4.6–6.5)

## 2017-11-13 MED ORDER — PREDNISONE 10 MG PO TABS: ORAL_TABLET | ORAL | 0 refills | Status: DC

## 2017-11-13 NOTE — Progress Notes (Signed)
[name], female   DOB: Jul 29, 1945, 72 y.o.   MRN: [id number]   Subjective:    [name], female    DOB: 1945-08-06, 72 y.o.   MRN: [id number]  HPI  Patient here for a scheduled follow up.  She reports that she has been having increased drainage, chest congestion and cough productive of green mucus.  Has been taking nyquil, tylenol and mucinex.  Still with persistent cough.  No sinus pressure.  Eating.  No abdominal pain.  Bowels moving.  Had problems with dizziness.  Diagnosed with peripheral vertigo.  Had MRI/MRA.  No acute findings.  No further dizziness.  No chest pain.  Handling stress.  Taking coumadin.     Past Medical History:  Diagnosis Date  . Allergy   . Asthma   . Chicken pox   . History of blood clots   . Hypercholesterolemia   . Hypertension    Past Surgical History:  Procedure Laterality Date  . blood clots  2008   Family History  Problem Relation Age of Onset  . Cancer Mother        Breast  . Heart disease Mother   . Stroke Mother   . Hypertension Mother   . Breast cancer Mother        17's  . Heart disease Father   . Stroke Father   . Hypertension Father   . Diabetes Father   . Colon cancer Unknown        paternal cousin   Social History   Socioeconomic History  . Marital status: Widowed    Spouse name: Not on file  . Number of children: Not on file  . Years of education: Not on file  . Highest education level: Not on file  Occupational History  . Not on file  Social Needs  . Financial resource strain: Not on file  . Food insecurity:    Worry: Not on file    Inability: Not on file  . Transportation needs:    Medical: Not on file    Non-medical: Not on file  Tobacco Use  . Smoking status: Never Smoker  . Smokeless tobacco: Never Used  Substance and Sexual Activity  . Alcohol use: No    Alcohol/week: 0.0 standard drinks  . Drug use: No  . Sexual activity: Not on file  Lifestyle  . Physical activity:    Days  per week: Not on file    Minutes per session: Not on file  . Stress: Not on file  Relationships  . Social connections:    Talks on phone: Not on file    Gets together: Not on file    Attends religious service: Not on file    Active member of club or organization: Not on file    Attends meetings of clubs or organizations: Not on file    Relationship status: Not on file  Other Topics Concern  . Not on file  Social History Narrative  . Not on file    Outpatient Encounter Medications as of 11/13/2017  Medication Sig  . acetaminophen (TYLENOL) 650 MG CR tablet Take 650 mg by mouth every 8 (eight) hours as needed for pain.  . citalopram (CELEXA) 20 MG tablet TAKE 1 TABLET BY MOUTH ONCE A DAY  . losartan (COZAAR) 50 MG tablet TAKE 1 TABLET BY MOUTH ONCE A DAY  . meclizine (ANTIVERT) 25 MG tablet Take 1 tablet (25 mg total) by mouth 3 (three)  times daily as needed for dizziness.  . nystatin (MYCOSTATIN) 100000 UNIT/ML suspension Take 5 mLs (500,000 Units total) by mouth 3 (three) times daily. (Patient not taking: Reported on 08/20/2017)  . nystatin (NYSTATIN) powder Apply topically 4 (four) times daily. (Patient not taking: Reported on 08/20/2017)  . nystatin cream (MYCOSTATIN) Apply 1 application topically 2 (two) times daily. (Patient not taking: Reported on 08/20/2017)  . predniSONE (DELTASONE) 10 MG tablet Take 6 tablets x 1 day and then decrease by 1/2 tablet per day until down to zero mg.  . [DISCONTINUED] ezetimibe (ZETIA) 10 MG tablet Take 1 tablet (10 mg total) by mouth daily. (Patient not taking: Reported on 08/20/2017)  . [DISCONTINUED] warfarin (COUMADIN) 5 MG tablet TAKE ONE (1) TABLET EACH DAY   No facility-administered encounter medications on file as of 11/13/2017.     Review of Systems  Constitutional: Negative for appetite change and unexpected weight change.  HENT: Positive for congestion and postnasal drip.   Respiratory: Positive for cough. Negative for chest tightness.          Chest congestion with productive cough.    Cardiovascular: Negative for chest pain, palpitations and leg swelling.  Gastrointestinal: Negative for abdominal pain, diarrhea, nausea and vomiting.  Genitourinary: Negative for difficulty urinating and dysuria.  Musculoskeletal: Negative for joint swelling and myalgias.  Skin: Negative for color change and rash.  Neurological: Negative for dizziness, light-headedness and headaches.  Psychiatric/Behavioral: Negative for agitation and dysphoric mood.       Objective:    Physical Exam  Constitutional: She appears well-developed and well-nourished. No distress.  HENT:  Nose: Nose normal.  Mouth/Throat: Oropharynx is clear and moist.  Neck: Neck supple. No thyromegaly present.  Cardiovascular: Normal rate and regular rhythm.  Pulmonary/Chest: Breath sounds normal. No respiratory distress.  Increased cough with forced expiration.    Abdominal: Soft. Bowel sounds are normal. There is no tenderness.  Musculoskeletal: She exhibits no edema or tenderness.  Lymphadenopathy:    She has no cervical adenopathy.  Skin: No rash noted. No erythema.  Psychiatric: She has a normal mood and affect. Her behavior is normal.    BP 136/88   Pulse 82   Temp 98.1 F (36.7 C) (Oral)   Resp 18   Wt 230 lb 6.4 oz (104.5 kg)   LMP 04/29/1997   SpO2 95%   BMI 40.81 kg/m  Wt Readings from Last 3 Encounters:  11/13/17 230 lb 6.4 oz (104.5 kg)  08/19/17 234 lb (106.1 kg)  08/14/17 234 lb 1.9 oz (106.2 kg)     Lab Results  Component Value Date   WBC 9.1 11/13/2017   HGB 14.8 11/13/2017   HCT 44.8 11/13/2017   PLT 321.0 11/13/2017   GLUCOSE 96 11/13/2017   CHOL 248 (H) 11/13/2017   TRIG 245.0 (H) 11/13/2017   HDL 55.50 11/13/2017   LDLDIRECT 174.0 11/13/2017   LDLCALC 72 04/10/2017   ALT 19 11/13/2017   AST 22 11/13/2017   NA 137 11/13/2017   K 4.4 11/13/2017   CL 101 11/13/2017   CREATININE 1.04 11/13/2017   BUN 13 11/13/2017   CO2  29 11/13/2017   TSH 4.42 04/10/2017   INR 1.8 (H) 11/13/2017   HGBA1C 6.0 11/13/2017    Mr Brain Wo Contrast (neuro Protocol)  Result Date: 08/20/2017 CLINICAL DATA:  Initial evaluation for acute dizziness, headache. EXAM: MRI HEAD WITHOUT CONTRAST MRA HEAD WITHOUT CONTRAST TECHNIQUE: Multiplanar, multiecho pulse sequences of the brain and surrounding structures were obtained  without intravenous contrast. Angiographic images of the head were obtained using MRA technique without contrast. COMPARISON:  Prior CT from 08/19/2017 FINDINGS: MRI HEAD FINDINGS Brain: Examination degraded by motion artifact. Generalized age appropriate cerebral atrophy. Minimal chronic microvascular ischemic changes present within the periventricular white matter. Probable tiny remote cortical infarct noted at the posterior right frontal region (series 17, image 36). No abnormal foci of restricted diffusion to suggest acute or subacute ischemia. Gray-white matter differentiation maintained. No other areas of chronic infarction. No evidence for acute or chronic intracranial hemorrhage. No mass lesion, midline shift or mass effect. No hydrocephalus. No extra-axial fluid collection. Pituitary gland within normal limits. Vascular: Major intracranial vascular flow voids maintained. Skull and upper cervical spine: Craniocervical junction normal. Upper cervical spine normal. Bone marrow signal intensity within normal limits. No scalp soft tissue abnormality. Sinuses/Orbits: Globes and orbital soft tissues demonstrate no acute finding. Left gaze noted. Paranasal sinuses are clear. Trace opacity bilateral mastoid air cells, of doubtful significance. Inner ear structures grossly normal. Other: None. MRA HEAD FINDINGS ANTERIOR CIRCULATION: Examination moderately degraded by motion artifact. Distal cervical segments of the internal carotid arteries are patent with antegrade flow. Petrous segments patent bilaterally. Cavernous and supraclinoid  ICAs grossly patent without obvious stenosis. ICA termini poorly assessed. A1 segments grossly patent. Anterior communicating artery not well assessed. Anterior cerebral arteries patent distally, poorly evaluated proximally due to extensive motion. M1 segments grossly patent without obvious stenosis, although evaluation limited due to motion. MCA bifurcations not well assessed. Distal MCA branches perfused and symmetric POSTERIOR CIRCULATION: Vertebral arteries patent to the vertebrobasilar junction. Atheromatous irregularity with mild stenosis within the distal right V4 segment. Left vertebral artery slightly dominant. Left PICA patent proximally. Right PICA not visualized. Basilar patent to its distal aspect without stenosis. Superior cerebral arteries grossly patent proximally. Both of the posterior cerebral arteries supplied via the basilar and are grossly patent proximally, not well assessed distally. No obvious intracranial aneurysm or other vascular abnormality. IMPRESSION: MRI HEAD IMPRESSION: 1. No acute intracranial abnormality identified. 2. 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 MRA HEAD IMPRESSION: 1. Severely limited study due to motion artifact. 2. Negative intracranial no obvious high-grade or correctable stenosis identified. Electronically Signed   By: Jeannine Boga M.D.   On: 08/20/2017 03:52   Mr Jodene Nam Head (cerebral Arteries)  Result Date: 08/20/2017 CLINICAL DATA:  Initial evaluation for acute dizziness, headache. EXAM: MRI HEAD WITHOUT CONTRAST MRA HEAD WITHOUT CONTRAST TECHNIQUE: Multiplanar, multiecho pulse sequences of the brain and surrounding structures were obtained without intravenous contrast. Angiographic images of the head were obtained using MRA technique without contrast. COMPARISON:  Prior CT from 08/19/2017 FINDINGS: MRI HEAD FINDINGS Brain: Examination degraded by motion artifact. Generalized age appropriate  cerebral atrophy. Minimal chronic microvascular ischemic changes present within the periventricular white matter. Probable tiny remote cortical infarct noted at the posterior right frontal region (series 17, image 36). No abnormal foci of restricted diffusion to suggest acute or subacute ischemia. Gray-white matter differentiation maintained. No other areas of chronic infarction. No evidence for acute or chronic intracranial hemorrhage. No mass lesion, midline shift or mass effect. No hydrocephalus. No extra-axial fluid collection. Pituitary gland within normal limits. Vascular: Major intracranial vascular flow voids maintained. Skull and upper cervical spine: Craniocervical junction normal. Upper cervical spine normal. Bone marrow signal intensity within normal limits. No scalp soft tissue abnormality. Sinuses/Orbits: Globes and orbital soft tissues demonstrate no acute finding. Left gaze noted. Paranasal sinuses  are clear. Trace opacity bilateral mastoid air cells, of doubtful significance. Inner ear structures grossly normal. Other: None. MRA HEAD FINDINGS ANTERIOR CIRCULATION: Examination moderately degraded by motion artifact. Distal cervical segments of the internal carotid arteries are patent with antegrade flow. Petrous segments patent bilaterally. Cavernous and supraclinoid ICAs grossly patent without obvious stenosis. ICA termini poorly assessed. A1 segments grossly patent. Anterior communicating artery not well assessed. Anterior cerebral arteries patent distally, poorly evaluated proximally due to extensive motion. M1 segments grossly patent without obvious stenosis, although evaluation limited due to motion. MCA bifurcations not well assessed. Distal MCA branches perfused and symmetric POSTERIOR CIRCULATION: Vertebral arteries patent to the vertebrobasilar junction. Atheromatous irregularity with mild stenosis within the distal right V4 segment. Left vertebral artery slightly dominant. Left PICA patent  proximally. Right PICA not visualized. Basilar patent to its distal aspect without stenosis. Superior cerebral arteries grossly patent proximally. Both of the posterior cerebral arteries supplied via the basilar and are grossly patent proximally, not well assessed distally. No obvious intracranial aneurysm or other vascular abnormality. IMPRESSION: MRI HEAD IMPRESSION: 1. No acute intracranial abnormality identified. 2. 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 MRA HEAD IMPRESSION: 1. Severely limited study due to motion artifact. 2. Negative intracranial no obvious high-grade or correctable stenosis identified. Electronically Signed   By: Jeannine Boga M.D.   On: 08/20/2017 03:52   Ct Head Code Stroke Wo Contrast  Result Date: 08/19/2017 CLINICAL DATA:  Code stroke. Initial evaluation for acute headache, nausea vomiting. EXAM: CT HEAD WITHOUT CONTRAST TECHNIQUE: Contiguous axial images were obtained from the base of the skull through the vertex without intravenous contrast. COMPARISON:  Prior CT from 03/28/2011. FINDINGS: Brain: Age-related cerebral atrophy. No acute intracranial hemorrhage. No acute large vessel territory infarct. No mass lesion, midline shift or mass effect. No hydrocephalus. No extra-axial fluid collection. Vascular: No asymmetric hyperdense vessel. Scattered vascular calcifications noted within the carotid siphons. Skull: Scalp soft tissues and calvarium within normal limits. Sinuses/Orbits: Globes and orbital soft tissues demonstrate no acute finding. Left gaze noted. Mild mucosal thickening within the right maxillary sinus. Paranasal sinuses are otherwise clear. No mastoid effusion. ASPECTS [facility] Stroke Program Early CT Score) - Ganglionic level infarction (caudate, lentiform nuclei, internal capsule, insula, M1-M3 cortex): 7 - Supraganglionic infarction (M4-M6 cortex): 3 Total score (0-10 with 10 being normal): 10  IMPRESSION: 1. No acute intracranial infarct or other abnormality identified. 2. ASPECTS is 10. Critical Value/emergent results were called by telephone at the time of interpretation on 08/19/2017 at 11:30 pm to Dr. Darel Hong , who verbally acknowledged these results. Electronically Signed   By: Jeannine Boga M.D.   On: 08/19/2017 23:33       Assessment & Plan:   Problem List Items Addressed This Visit    Asthma    Treat with prednisone taper as directed.  Hold abx.        Relevant Medications   predniSONE (DELTASONE) 10 MG tablet   Back pain    Has seen pain clinic previously.  Stable.  Follow.        Relevant Medications   predniSONE (DELTASONE) 10 MG tablet   Cough    Increased cough and congestion as outlined.  Saline nasal spray and nasacort as directed.  Prednisone taper as directed.  Continue mucinex.  Follow.  Check cxr.        Essential hypertension, benign - Primary    Blood pressure recheck - improved.  Follow.  Continue same medication regimen.        Relevant Orders   CBC with Differential/Platelet (Completed)   Basic metabolic panel (Completed)   Hypercholesterolemia    Low cholesterol diet and exercise.  Follow lipid panel.        Relevant Orders   Hepatic function panel (Completed)   Lipid panel (Completed)   Hyperglycemia    Low carb diet and exercise.  Follow met b and a1c.        Relevant Orders   Hemoglobin A1c (Completed)   Long term current use of anticoagulant therapy    On coumadin.  Check pt/inr.       Relevant Orders   Protime-INR (Completed)    Other Visit Diagnoses    Persistent cough       Relevant Orders   DG Chest 2 View (Completed)       Einar Pheasant, MD

## 2017-11-14 ENCOUNTER — Encounter: Payer: Self-pay | Admitting: *Deleted

## 2017-11-14 ENCOUNTER — Other Ambulatory Visit: Payer: Self-pay | Admitting: Internal Medicine

## 2017-11-14 DIAGNOSIS — Z7901 Long term (current) use of anticoagulants: Secondary | ICD-10-CM

## 2017-11-14 NOTE — Progress Notes (Signed)
Order placed for f/u pt/inr

## 2017-11-18 NOTE — Assessment & Plan Note (Signed)
On coumadin.  Check pt/inr.  ?

## 2017-11-18 NOTE — Assessment & Plan Note (Signed)
Increased cough and congestion as outlined.  Saline nasal spray and nasacort as directed.  Prednisone taper as directed.  Continue mucinex.  Follow.  Check cxr.

## 2017-11-18 NOTE — Assessment & Plan Note (Signed)
Low carb diet and exercise.  Follow met b and a1c.   

## 2017-11-18 NOTE — Assessment & Plan Note (Signed)
Treat with prednisone taper as directed.  Hold abx.

## 2017-11-18 NOTE — Assessment & Plan Note (Signed)
Has seen pain clinic previously.  Stable.  Follow.

## 2017-11-18 NOTE — Assessment & Plan Note (Signed)
Blood pressure recheck - improved.  Follow.  Continue same medication regimen.

## 2017-11-18 NOTE — Assessment & Plan Note (Signed)
Low cholesterol diet and exercise.  Follow lipid panel.   

## 2017-11-24 ENCOUNTER — Other Ambulatory Visit: Payer: Self-pay | Admitting: Internal Medicine

## 2017-11-26 ENCOUNTER — Other Ambulatory Visit: Payer: Medicare Other

## 2017-12-10 ENCOUNTER — Other Ambulatory Visit: Payer: Self-pay | Admitting: Internal Medicine

## 2018-01-16 ENCOUNTER — Other Ambulatory Visit: Payer: Self-pay | Admitting: Internal Medicine

## 2018-01-16 MED ORDER — WARFARIN SODIUM 5 MG PO TABS: ORAL_TABLET | ORAL | 1 refills | Status: DC

## 2018-01-16 MED ORDER — CITALOPRAM HYDROBROMIDE 20 MG PO TABS: 20.0000 mg | ORAL_TABLET | Freq: Every day | ORAL | 1 refills | Status: DC

## 2018-01-16 MED ORDER — LOSARTAN POTASSIUM 50 MG PO TABS: ORAL_TABLET | ORAL | 1 refills | Status: DC

## 2018-01-16 NOTE — Telephone Encounter (Signed)
Requested medication (s) are due for refill today: Yes  Requested medication (s) are on the active medication list: Yes  Last refill:  08/20/17  Future visit scheduled: Yes  Notes to clinic:  Unable to refill per protocol. Please see notes as Duarte would like medication list updated.     Requested Prescriptions  Pending Prescriptions Disp Refills   meclizine (ANTIVERT) 25 MG tablet 30 tablet 0    Sig: Take 1 tablet (25 mg total) by mouth 3 (three) times daily as needed for dizziness.     Not Delegated - Gastroenterology: Antiemetics Failed - 01/16/2018  5:10 PM      Failed - This refill cannot be delegated      Passed - Valid encounter within last 6 months    Recent Outpatient Visits          2 months ago Essential hypertension, benign   Tina Duarte Tina Duarte, [place], MD   5 months ago Essential hypertension, benign   [place] [facility] Tina Duarte Patient, MD   9 months ago Routine general medical examination at a health Duarte facility   [facility], Tina Patient, MD   1 year ago Hyperglycemia   [place] [facility] Tina Fleming, MD   1 year ago Hypercholesterolemia   [place], MD      Future Appointments            In 2 weeks Tina Pheasant, MD Tina Duarte, [place]   In 7 months Tina Duarte, Tina Duarte, Tina Duarte, Tina Duarte   In 7 months Tina Pheasant, MD Tina Duarte, Tina Duarte         Signed Prescriptions Disp Refills   warfarin (COUMADIN) 5 MG tablet 30 tablet 1    Sig: TAKE 1 TABLET BY MOUTH ONCE A DAY     Hematology:  Anticoagulants - warfarin Failed - 01/16/2018  5:10 PM      Failed - If the Duarte is managed by Coumadin Clinic - route to their Pool. If not, forward to the provider.      Failed - INR in normal range and within 30 days    INR  Date Value Ref Range Status  11/13/2017 1.8 (H) 0.8 - 1.0  ratio Final  04/20/2013 1.6  Final    Comment:    INR reference interval applies to patients on anticoagulant therapy. A single INR therapeutic range for coumarins is not optimal for all indications; however, the suggested range for most indications is 2.0 - 3.0. Exceptions to the INR Reference Range may include: Prosthetic heart valves, acute myocardial infarction, prevention of myocardial infarction, and combinations of aspirin and anticoagulant. The need for a higher or lower target INR must be assessed individually. Reference: The Pharmacology and Management of the Vitamin K  antagonists: the seventh ACCP Conference on Antithrombotic and Thrombolytic Therapy. GEXBM.8413 Sept:126 (3suppl): [id number]. A HCT value >55% may artifactually increase the PT.  In one study,  the increase was an average of 25%. Reference:  "Effect on Routine and Special Coagulation Testing Values of Citrate Anticoagulant Adjustment in Patients with High HCT Values." American Journal of Clinical Pathology 2006;126:400-405.          Passed - Valid encounter within last 3 months    Recent Outpatient Visits          2 months ago Essential hypertension, benign   [facility] [place], Tina Patient, MD   5  months ago Essential hypertension, benign   [place] [facility] Tina Duarte, [place], MD   9 months ago Routine general medical examination at a health Duarte facility   [facility] Tina Duarte, [place], MD   1 year ago Hyperglycemia   [facility] Tina Duarte, Tina Patient, MD   1 year ago Hypercholesterolemia   [facility] Tina Duarte, Tina Patient, MD      Future Appointments            In 2 weeks Tina Pheasant, MD [place], Tina Duarte   In 7 months Tina Duarte, Tina Duarte, Tina Duarte Tina Duarte, Tina Duarte   In 7 months Tina Pheasant, MD Tina Duarte, Tina Duarte          losartan (COZAAR) 50 MG tablet 90  tablet 1    Sig: TAKE 1 TABLET BY MOUTH ONCE A DAY     Cardiovascular:  Angiotensin Receptor Blockers Passed - 01/16/2018  5:10 PM      Passed - Cr in normal range and within 180 days    Creatinine  Date Value Ref Range Status  02/23/2013 1.04 0.60 - 1.30 mg/dL Final   Creatinine, Ser  Date Value Ref Range Status  11/13/2017 1.04 0.40 - 1.20 mg/dL Final         Passed - K in normal range and within 180 days    Potassium  Date Value Ref Range Status  11/13/2017 4.4 3.5 - 5.1 mEq/[place] Final  02/23/2013 3.9 3.5 - 5.1 mmol/[place] Final         Passed - Duarte is not pregnant      Passed - Last BP in normal range    BP Readings from Last 1 Encounters:  11/18/17 136/88         Passed - Valid encounter within last 6 months    Recent Outpatient Visits          2 months ago Essential hypertension, benign   [place] [facility] Tina Duarte, Tina Patient, MD   5 months ago Essential hypertension, benign   [place] [facility] Tina Duarte, Tina Patient, MD   9 months ago Routine general medical examination at a health [facility] facility   [facility], Tina Patient, MD   1 year ago Hyperglycemia   [facility] Tina Duarte, Tina Patient, MD   1 year ago Hypercholesterolemia   [place], MD      Future Appointments            In 2 weeks Tina Pheasant, MD [facility], [place]   In 7 months Tina Duarte, Tina Duarte, Tina Duarte [place], [place]   In 7 months Tina Pheasant, MD [place], Tina Duarte          citalopram (CELEXA) 20 MG tablet 90 tablet 1    Sig: Take 1 tablet (20 mg total) by mouth daily.     Psychiatry:  Antidepressants - SSRI Passed - 01/16/2018  5:10 PM      Passed - Valid encounter within last 6 months    Recent Outpatient Visits          2 months ago Essential hypertension, benign   [place] [facility] Tina Duarte, [place], MD   5 months ago  Essential hypertension, benign   [place] [facility] Tina Duarte, Tina Patient, MD   9 months ago Routine general medical examination at a health Duarte facility   Tina Duarte,  Tina Patient, MD   1 year ago Hyperglycemia   [facility] Tina Duarte, [place], MD   1 year ago Hypercholesterolemia   [facility] Tina Fleming, MD      Future Appointments            In 2 weeks Tina Pheasant, MD [facility], [place]   In 7 months Tina Duarte, Tina Duarte, Tina Duarte [place], [place]   In 7 months Tina Pheasant, MD [facility], Tina Duarte         Refused Prescriptions Disp Refills   rosuvastatin (CRESTOR) 5 MG tablet 30 tablet 0    Sig: Take 1 tablet (5 mg total) by mouth daily.     Cardiovascular:  Antilipid - Statins Failed - 01/16/2018  5:10 PM      Failed - Total Cholesterol in normal range and within 360 days    Cholesterol  Date Value Ref Range Status  11/13/2017 248 (H) 0 - 200 mg/dL Final    Comment:    ATP III Classification       Desirable:  < 200 mg/dL               Borderline High:  200 - 239 mg/dL          High:  > = 240 mg/dL         Failed - Triglycerides in normal range and within 360 days    Triglycerides  Date Value Ref Range Status  11/13/2017 245.0 (H) 0.0 - 149.0 mg/dL Final    Comment:    Normal:  <150 mg/dLBorderline High:  150 - 199 mg/dL         Passed - LDL in normal range and within 360 days    LDL Cholesterol  Date Value Ref Range Status  04/10/2017 72 0 - 99 mg/dL Final         Passed - HDL in normal range and within 360 days    HDL  Date Value Ref Range Status  11/13/2017 55.50 >39.00 mg/dL Final         Passed - Duarte is not pregnant      Passed - Valid encounter within last 12 months    Recent Outpatient Visits          2 months ago Essential hypertension, benign   [place] [facility] [place], Tina Patient, MD   5 months  ago Essential hypertension, benign   [place] [facility] [place], Tina Patient, MD   9 months ago Routine general medical examination at a health [facility] facility   [facility], Tina Patient, MD   1 year ago Hyperglycemia   [facility] [place], Tina Patient, MD   1 year ago Hypercholesterolemia   [facility] [place], MD      Future Appointments            In 2 weeks Tina Pheasant, MD [facility], [place]   In 7 months Tina Duarte, Tina Duarte, [place], [place]   In 7 months Tina Duarte, [place], [place]

## 2018-01-16 NOTE — Telephone Encounter (Signed)
Copied from [place] [phone]. Topic: Quick Communication - Rx Refill/Question >> Jan 16, 2018  2:24 PM Margot Ables wrote: Medication: [facility] closed and all prescriptions need sent in rosuvastatin (CRESTOR) 5 MG tablet - pt is out  warfarin (COUMADIN) 5 MG tablet - 3-4 weeks left losartan (COZAAR) 50 MG tablet - 3-4 weeks left citalopram (CELEXA) 20 MG tablet - about 2 months left meclizine (ANTIVERT) 25 MG tablet - pt has a few left - uses prn  ezetimibe (ZETIA) 10 MG tablet - pt states she doesn't take this anymore nystatin (MYCOSTATIN) 100000 UNIT/ML suspension  - pt states she doesn't take this anymore nystatin (NYSTATIN) powder - pt states she doesn't take this anymore nystatin cream (MYCOSTATIN) - pt states she doesn't take this anymore  Has the patient contacted their pharmacy? No - [facility] closed and pt cannot get prescriptions transferred  Preferred Pharmacy (with phone number or street name): [id number] [address], [place] [phone] (Phone) [phone] (Fax)

## 2018-01-17 ENCOUNTER — Other Ambulatory Visit: Payer: Medicare Other

## 2018-01-17 NOTE — Telephone Encounter (Signed)
This is not a medication that is taken on a regular basis.  Need to clarify what symptoms she is having and why still needing.  If persistent dizziness, will need to be evaluated.  Need more info.

## 2018-01-22 ENCOUNTER — Telehealth: Payer: Self-pay | Admitting: Internal Medicine

## 2018-01-22 NOTE — Telephone Encounter (Signed)
Copied from [place] [phone]. Topic: General - Other >> Jan 22, 2018  8:57 AM Lennox Solders wrote: Reason for CRM: pt is calling and her generic crestor was denied. Pt would like to know the reason for denial. [employer] street in [employer]

## 2018-01-24 ENCOUNTER — Other Ambulatory Visit: Payer: Self-pay

## 2018-01-24 MED ORDER — ROSUVASTATIN CALCIUM 5 MG PO TABS: 5.0000 mg | ORAL_TABLET | Freq: Every day | ORAL | 0 refills | Status: DC

## 2018-01-31 ENCOUNTER — Ambulatory Visit (INDEPENDENT_AMBULATORY_CARE_PROVIDER_SITE_OTHER): Payer: Medicare Other | Admitting: Internal Medicine

## 2018-01-31 ENCOUNTER — Other Ambulatory Visit: Payer: Self-pay

## 2018-01-31 ENCOUNTER — Encounter: Payer: Self-pay | Admitting: Internal Medicine

## 2018-01-31 ENCOUNTER — Inpatient Hospital Stay
Admission: EM | Admit: 2018-01-31 | Discharge: 2018-02-04 | DRG: 202 | Disposition: A | Discharge status: Home Health | Payer: Medicare Other | Attending: Internal Medicine | Admitting: Internal Medicine

## 2018-01-31 ENCOUNTER — Emergency Department: Payer: Medicare Other

## 2018-01-31 ENCOUNTER — Ambulatory Visit: Payer: Medicare Other

## 2018-01-31 ENCOUNTER — Other Ambulatory Visit: Payer: Self-pay | Admitting: Internal Medicine

## 2018-01-31 VITALS — BP 138/72 | HR 84 | Temp 98.4°F | Resp 18 | Wt 232.4 lb

## 2018-01-31 DIAGNOSIS — R05 Cough: Secondary | ICD-10-CM

## 2018-01-31 DIAGNOSIS — E785 Hyperlipidemia, unspecified: Secondary | ICD-10-CM | POA: Diagnosis not present

## 2018-01-31 DIAGNOSIS — E876 Hypokalemia: Secondary | ICD-10-CM | POA: Diagnosis present

## 2018-01-31 DIAGNOSIS — R739 Hyperglycemia, unspecified: Secondary | ICD-10-CM | POA: Diagnosis not present

## 2018-01-31 DIAGNOSIS — R059 Cough, unspecified: Secondary | ICD-10-CM

## 2018-01-31 DIAGNOSIS — Z833 Family history of diabetes mellitus: Secondary | ICD-10-CM | POA: Diagnosis not present

## 2018-01-31 DIAGNOSIS — J9601 Acute respiratory failure with hypoxia: Secondary | ICD-10-CM | POA: Diagnosis present

## 2018-01-31 DIAGNOSIS — J4541 Moderate persistent asthma with (acute) exacerbation: Principal | ICD-10-CM | POA: Diagnosis present

## 2018-01-31 DIAGNOSIS — J209 Acute bronchitis, unspecified: Secondary | ICD-10-CM | POA: Diagnosis present

## 2018-01-31 DIAGNOSIS — Z7951 Long term (current) use of inhaled steroids: Secondary | ICD-10-CM | POA: Diagnosis not present

## 2018-01-31 DIAGNOSIS — Z803 Family history of malignant neoplasm of breast: Secondary | ICD-10-CM | POA: Diagnosis not present

## 2018-01-31 DIAGNOSIS — Z8249 Family history of ischemic heart disease and other diseases of the circulatory system: Secondary | ICD-10-CM | POA: Diagnosis not present

## 2018-01-31 DIAGNOSIS — Z86718 Personal history of other venous thrombosis and embolism: Secondary | ICD-10-CM

## 2018-01-31 DIAGNOSIS — Z800 Family history of malignant neoplasm of digestive organs: Secondary | ICD-10-CM | POA: Diagnosis not present

## 2018-01-31 DIAGNOSIS — R0602 Shortness of breath: Secondary | ICD-10-CM | POA: Diagnosis not present

## 2018-01-31 DIAGNOSIS — Z79899 Other long term (current) drug therapy: Secondary | ICD-10-CM | POA: Diagnosis not present

## 2018-01-31 DIAGNOSIS — I10 Essential (primary) hypertension: Secondary | ICD-10-CM | POA: Diagnosis present

## 2018-01-31 DIAGNOSIS — E7800 Pure hypercholesterolemia, unspecified: Secondary | ICD-10-CM

## 2018-01-31 DIAGNOSIS — Z7901 Long term (current) use of anticoagulants: Secondary | ICD-10-CM

## 2018-01-31 DIAGNOSIS — J4520 Mild intermittent asthma, uncomplicated: Secondary | ICD-10-CM

## 2018-01-31 DIAGNOSIS — R062 Wheezing: Secondary | ICD-10-CM | POA: Diagnosis not present

## 2018-01-31 DIAGNOSIS — Z823 Family history of stroke: Secondary | ICD-10-CM | POA: Diagnosis not present

## 2018-01-31 DIAGNOSIS — F329 Major depressive disorder, single episode, unspecified: Secondary | ICD-10-CM | POA: Diagnosis present

## 2018-01-31 DIAGNOSIS — J45901 Unspecified asthma with (acute) exacerbation: Secondary | ICD-10-CM | POA: Diagnosis not present

## 2018-01-31 LAB — BASIC METABOLIC PANEL
Anion gap: 8 (ref 5–15)
BUN: 14 mg/dL (ref 8–23)
CHLORIDE: 104 mmol/L (ref 98–111)
CO2: 26 mmol/L (ref 22–32)
Calcium: 8.6 mg/dL — ABNORMAL LOW (ref 8.9–10.3)
Creatinine, Ser: 0.94 mg/dL (ref 0.44–1.00)
GFR calc Af Amer: >60 mL/min (ref >60)
GFR calc non Af Amer: 60 mL/min — ABNORMAL LOW (ref >60)
Glucose, Bld: 106 mg/dL — ABNORMAL HIGH (ref 70–99)
Potassium: 3.4 mmol/L — ABNORMAL LOW (ref 3.5–5.1)
SODIUM: 138 mmol/L (ref 135–145)

## 2018-01-31 LAB — CBC
HCT: 42.4 % (ref 36.0–46.0)
Hemoglobin: 13.3 g/dL (ref 12.0–15.0)
MCH: 29 pg (ref 26.0–34.0)
MCHC: 31.4 g/dL (ref 30.0–36.0)
MCV: 92.4 fL (ref 80.0–100.0)
Platelets: 239 10*3/uL (ref 150–400)
RBC: 4.59 MIL/uL (ref 3.87–5.11)
RDW: 13.1 % (ref 11.5–15.5)
WBC: 7.7 10*3/uL (ref 4.0–10.5)
nRBC: 0 % (ref 0.0–0.2)

## 2018-01-31 LAB — TROPONIN I: Troponin I: <0.03 ng/mL (ref <0.03)

## 2018-01-31 LAB — PROTIME-INR
INR: 2.18
PROTHROMBIN TIME: 24 s — AB (ref 11.4–15.2)

## 2018-01-31 LAB — INFLUENZA PANEL BY PCR (TYPE A & B)
Influenza A By PCR: NEGATIVE
Influenza B By PCR: NEGATIVE

## 2018-01-31 MED ORDER — ALBUTEROL SULFATE HFA 108 (90 BASE) MCG/ACT IN AERS: 2.0000 | INHALATION_SPRAY | Freq: Four times a day (QID) | RESPIRATORY_TRACT | 1 refills | Status: DC | PRN

## 2018-01-31 MED ORDER — METHYLPREDNISOLONE SODIUM SUCC 125 MG IJ SOLR
60.0000 mg | Freq: Two times a day (BID) | INTRAMUSCULAR | Status: DC
  Administered 2018-01-31 – 2018-02-02 (×4): 60 mg via INTRAVENOUS
  Filled 2018-01-31 (×4): qty 2

## 2018-01-31 MED ORDER — MAGNESIUM SULFATE IN D5W 1-5 GM/100ML-% IV SOLN
1.0000 g | Freq: Once | INTRAVENOUS | Status: AC
  Administered 2018-01-31: 1 g via INTRAVENOUS
  Filled 2018-01-31: qty 100

## 2018-01-31 MED ORDER — ACETAMINOPHEN 650 MG RE SUPP: 650.0000 mg | Freq: Four times a day (QID) | RECTAL | Status: DC | PRN

## 2018-01-31 MED ORDER — ROSUVASTATIN CALCIUM 10 MG PO TABS
5.0000 mg | ORAL_TABLET | Freq: Every day | ORAL | Status: DC
  Administered 2018-02-01 – 2018-02-04 (×4): 5 mg via ORAL
  Filled 2018-01-31 (×4): qty 1

## 2018-01-31 MED ORDER — ACETAMINOPHEN 325 MG PO TABS
650.0000 mg | ORAL_TABLET | Freq: Four times a day (QID) | ORAL | Status: DC | PRN
  Administered 2018-02-04: 650 mg via ORAL
  Filled 2018-01-31: qty 2

## 2018-01-31 MED ORDER — ALBUTEROL SULFATE (2.5 MG/3ML) 0.083% IN NEBU
2.5000 mg | INHALATION_SOLUTION | Freq: Once | RESPIRATORY_TRACT | Status: AC
  Administered 2018-01-31: 2.5 mg via RESPIRATORY_TRACT
  Filled 2018-01-31: qty 3

## 2018-01-31 MED ORDER — POTASSIUM CHLORIDE CRYS ER 20 MEQ PO TBCR
40.0000 meq | EXTENDED_RELEASE_TABLET | Freq: Once | ORAL | Status: AC
  Administered 2018-01-31: 40 meq via ORAL
  Filled 2018-01-31: qty 2

## 2018-01-31 MED ORDER — IPRATROPIUM-ALBUTEROL 0.5-2.5 (3) MG/3ML IN SOLN
3.0000 mL | Freq: Once | RESPIRATORY_TRACT | Status: AC
  Administered 2018-01-31: 3 mL via RESPIRATORY_TRACT
  Filled 2018-01-31: qty 3

## 2018-01-31 MED ORDER — ALBUTEROL SULFATE (2.5 MG/3ML) 0.083% IN NEBU
INHALATION_SOLUTION | RESPIRATORY_TRACT | Status: AC
  Administered 2018-01-31: 14:00:00
  Filled 2018-01-31: qty 6

## 2018-01-31 MED ORDER — ONDANSETRON HCL 4 MG PO TABS: 4.0000 mg | ORAL_TABLET | Freq: Four times a day (QID) | ORAL | Status: DC | PRN

## 2018-01-31 MED ORDER — ROSUVASTATIN CALCIUM 5 MG PO TABS: 5.0000 mg | ORAL_TABLET | Freq: Every day | ORAL | 1 refills | Status: DC

## 2018-01-31 MED ORDER — FLUTICASONE PROPIONATE HFA 110 MCG/ACT IN AERO: 2.0000 | INHALATION_SPRAY | Freq: Two times a day (BID) | RESPIRATORY_TRACT | 1 refills | Status: DC

## 2018-01-31 MED ORDER — CITALOPRAM HYDROBROMIDE 20 MG PO TABS
20.0000 mg | ORAL_TABLET | Freq: Every day | ORAL | Status: DC
  Administered 2018-02-01 – 2018-02-04 (×4): 20 mg via ORAL
  Filled 2018-01-31 (×4): qty 1

## 2018-01-31 MED ORDER — LOSARTAN POTASSIUM 50 MG PO TABS
50.0000 mg | ORAL_TABLET | Freq: Every day | ORAL | Status: DC
  Administered 2018-02-01 – 2018-02-04 (×4): 50 mg via ORAL
  Filled 2018-01-31 (×4): qty 1

## 2018-01-31 MED ORDER — ALBUTEROL SULFATE (2.5 MG/3ML) 0.083% IN NEBU
2.5000 mg | INHALATION_SOLUTION | RESPIRATORY_TRACT | Status: DC | PRN
  Administered 2018-02-04: 2.5 mg via RESPIRATORY_TRACT
  Filled 2018-01-31: qty 3

## 2018-01-31 MED ORDER — ALBUTEROL SULFATE (2.5 MG/3ML) 0.083% IN NEBU
5.0000 mg | INHALATION_SOLUTION | Freq: Once | RESPIRATORY_TRACT | Status: AC
  Administered 2018-01-31: 5 mg via RESPIRATORY_TRACT

## 2018-01-31 MED ORDER — ONDANSETRON HCL 4 MG/2ML IJ SOLN: 4.0000 mg | Freq: Four times a day (QID) | INTRAMUSCULAR | Status: DC | PRN

## 2018-01-31 MED ORDER — CEFDINIR 300 MG PO CAPS: 300.0000 mg | ORAL_CAPSULE | Freq: Two times a day (BID) | ORAL | 0 refills | Status: DC

## 2018-01-31 MED ORDER — PREDNISONE 10 MG PO TABS: ORAL_TABLET | ORAL | 0 refills | Status: DC

## 2018-01-31 MED ORDER — BUDESONIDE 0.25 MG/2ML IN SUSP: 0.2500 mg | Freq: Two times a day (BID) | RESPIRATORY_TRACT | Status: DC

## 2018-01-31 MED ORDER — BUDESONIDE 0.25 MG/2ML IN SUSP
0.2500 mg | Freq: Two times a day (BID) | RESPIRATORY_TRACT | Status: DC
  Administered 2018-02-01 – 2018-02-04 (×7): 0.25 mg via RESPIRATORY_TRACT
  Filled 2018-01-31 (×7): qty 2

## 2018-01-31 MED ORDER — ENOXAPARIN SODIUM 40 MG/0.4ML ~~LOC~~ SOLN
40.0000 mg | SUBCUTANEOUS | Status: DC
  Administered 2018-01-31 – 2018-02-01 (×2): 40 mg via SUBCUTANEOUS
  Filled 2018-01-31 (×2): qty 0.4

## 2018-01-31 MED ORDER — FLUTICASONE PROPIONATE HFA 110 MCG/ACT IN AERO: 2.0000 | INHALATION_SPRAY | Freq: Two times a day (BID) | RESPIRATORY_TRACT | Status: DC

## 2018-01-31 MED ORDER — IPRATROPIUM-ALBUTEROL 0.5-2.5 (3) MG/3ML IN SOLN
3.0000 mL | Freq: Four times a day (QID) | RESPIRATORY_TRACT | Status: DC
  Administered 2018-02-01 – 2018-02-04 (×13): 3 mL via RESPIRATORY_TRACT
  Filled 2018-01-31 (×13): qty 3

## 2018-01-31 MED ORDER — METHYLPREDNISOLONE SODIUM SUCC 125 MG IJ SOLR
125.0000 mg | INTRAMUSCULAR | Status: AC
  Administered 2018-01-31: 125 mg via INTRAVENOUS
  Filled 2018-01-31: qty 2

## 2018-01-31 MED ORDER — POLYETHYLENE GLYCOL 3350 17 G PO PACK: 17.0000 g | PACK | Freq: Every day | ORAL | Status: DC | PRN

## 2018-01-31 NOTE — Progress Notes (Signed)
Advance care planning  Purpose of Encounter Acute bronchitis and CODE STATUS discussion  Parties in Attendance Patient  Patients Decisional capacity Patient is alert and oriented.  Does not have a documented healthcare power of attorney or advanced directives in place.  She would like her daughter- Jefferson Fuel and son -Nicki Reaper [employer] to be medical decision makers if she is unable to.  Patient had decided along with her husband to be DO NOT RESUSCITATE and DO NOT INTUBATE after experiencing her sister going through resuscitation.  Her husband passed away in this hospital as he was a DO NOT RESUSCITATE and died prior to patient arriving.  She feels that he would have survived if he had received life support.  And this made her change her decision and presently she wants to be full code until she discusses further with her family.  Orders entered for full code.  Time spent - 17 minutes

## 2018-01-31 NOTE — Telephone Encounter (Signed)
rx sent in for crestor #90 with one refill.

## 2018-01-31 NOTE — Assessment & Plan Note (Signed)
Follow met b and a1c.  

## 2018-01-31 NOTE — ED Triage Notes (Signed)
SOB x 1 week, worse over last few days. Sounds tight in triage. States "I think I have some asthma or bronchitis." denies COPD. States cough and congestion as well. Denies fever. Talking in complete sentences.

## 2018-01-31 NOTE — ED Notes (Signed)
Pt ambulated to bathroom. O2 at 98-99% RA. Pt walking slowly and seems to be SOB. Pt's O2 dropped to 92% once she was placed back in bed. Will inform MD Quale

## 2018-01-31 NOTE — Assessment & Plan Note (Signed)
Has a documented history of asthma.  With increased cough, congestion, wheezing and sob.  Weakness.  Hard for her to get up and walk down hall.  Normally able to move around well and is active.  Was given albuterol neb here in the office.  Still with increased tightness and wheezing.  Concern regarding persistent sob and weakness over her going home.  Discussed with pt.  Feels she needs ER evaluation.  Pt in agreement.  Declines ambulance transportation.  Son to take to ER.  ER notified.

## 2018-01-31 NOTE — Addendum Note (Signed)
Addended by: Arby Barrette on: 01/31/2018 01:09 PM   Modules accepted: Orders

## 2018-01-31 NOTE — Progress Notes (Signed)
rx sent in for crestor #90 with one refill.

## 2018-01-31 NOTE — ED Provider Notes (Signed)
[facility] Emergency Department Provider Note   ____________________________________________   First MD Initiated Contact with Patient 01/31/18 1553     (approximate)  I have reviewed the triage vital signs and the nursing notes.   HISTORY  Chief Complaint Shortness of Breath    HPI Tina Duarte is a 73 y.o. female here for evaluation of has a history of asthma, also previous DVT on Coumadin  Patient presents reports that for about 2 months she has had a really pretty persistent cough, but the last 3 days she started noticing it really became worse and she has a lot of wheezing.  She saw her doctor today, they gave her a nebulizer treatment and recommended she need to come to the emergency room to be evaluated due to her difficulty breathing.  She reports is worse with exertion.  No pain associated.  No fever, but has felt a little bit like she picked up some sort of an infection or virus.  No nausea vomiting.  No abdominal pain.  Reports mild shortness of breath, frequent cough and lots of "wheezing".  She recently took steroids in September for similar, but reports seems to be worse now   Past Medical History:  Diagnosis Date  . Allergy   . Asthma   . Chicken pox   . History of blood clots   . Hypercholesterolemia   . Hypertension     Patient Active Problem List   Diagnosis Date Noted  . Muscle fatigue 07/24/2017  . Hyperglycemia 09/12/2016  . Sinusitis 04/05/2015  . Sore throat 10/17/2014  . Health care maintenance 09/20/2014  . LOC (loss of consciousness) ([place]) 06/09/2014  . Heel pain 05/31/2014  . Long term current use of anticoagulant therapy 04/04/2014  . Leg pain 02/16/2014  . Back pain 12/28/2013  . Stress 09/07/2013  . Cough 04/16/2013  . Left hip pain 12/05/2012  . 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 Rib pain 08/28/2012  . Essential hypertension, benign 05/22/2012  . Hypercholesterolemia 05/22/2012  . Environmental allergies  05/22/2012  . Asthma 05/22/2012  . History of blood clots 05/22/2012  . Diarrhea 05/22/2012    Past Surgical History:  Procedure Laterality Date  . blood clots  2008    Prior to Admission medications   Medication Sig Start Date End Date Taking? Authorizing Provider  acetaminophen (TYLENOL) 650 MG CR tablet Take 650 mg by mouth every 8 (eight) hours as needed for pain.    [provider]  albuterol (PROVENTIL HFA;VENTOLIN HFA) 108 (90 Base) MCG/ACT inhaler Inhale 2 puffs into the lungs every 6 (six) hours as needed for wheezing or shortness of breath. 01/31/18   Einar Pheasant, MD  cefdinir (OMNICEF) 300 MG capsule Take 1 capsule (300 mg total) by mouth 2 (two) times daily. 01/31/18   Einar Pheasant, MD  citalopram (CELEXA) 20 MG tablet Take 1 tablet (20 mg total) by mouth daily. 01/16/18   Einar Pheasant, MD  ezetimibe (ZETIA) 10 MG tablet Take 1 tablet (10 mg total) by mouth daily. 11/13/17   Einar Pheasant, MD  fluticasone (FLOVENT HFA) 110 MCG/ACT inhaler Inhale 2 puffs into the lungs 2 (two) times daily. Rinse mouth after use 01/31/18   Einar Pheasant, MD  losartan (COZAAR) 50 MG tablet TAKE 1 TABLET BY MOUTH ONCE A DAY 01/16/18   Einar Pheasant, MD  meclizine (ANTIVERT) 25 MG tablet Take 1 tablet (25 mg total) by mouth 3 (three) times daily as needed for dizziness. 08/20/17  Darel Hong, MD  nystatin (MYCOSTATIN) 100000 UNIT/ML suspension Take 5 mLs (500,000 Units total) by mouth 3 (three) times daily. Patient not taking: Reported on 08/20/2017 05/05/15   Einar Pheasant, MD  nystatin (NYSTATIN) powder Apply topically 4 (four) times daily. Patient not taking: Reported on 08/20/2017 04/10/17   Einar Pheasant, MD  nystatin cream (MYCOSTATIN) Apply 1 application topically 2 (two) times daily. Patient not taking: Reported on 08/20/2017 04/10/17   Einar Pheasant, MD  predniSONE (DELTASONE) 10 MG tablet Take 6 tablets x 1 day and then decrease by 1/2 tablet per day until down to  zero mg. 01/31/18   Einar Pheasant, MD  rosuvastatin (CRESTOR) 5 MG tablet Take 1 tablet (5 mg total) by mouth daily. 01/24/18   Einar Pheasant, MD  warfarin (COUMADIN) 5 MG tablet TAKE 1 TABLET BY MOUTH ONCE A DAY 01/16/18   Einar Pheasant, MD    Allergies Patient has no known allergies.  Family History  Problem Relation Age of Onset  . Cancer Mother        Breast  . Heart disease Mother   . Stroke Mother   . Hypertension Mother   . Breast cancer Mother        55's  . Heart disease Father   . Stroke Father   . Hypertension Father   . Diabetes Father   . Colon cancer Other        paternal cousin    Social History Social History   Tobacco Use  . Smoking status: Never Smoker  . Smokeless tobacco: Never Used  Substance Use Topics  . Alcohol use: No    Alcohol/week: 0.0 standard drinks  . Drug use: No    Review of Systems Constitutional: No fever/chills Eyes: No visual changes. ENT: No sore throat. Cardiovascular: Denies chest pain. Respiratory: See HPI Gastrointestinal: No abdominal pain.   Genitourinary: Negative for dysuria. Musculoskeletal: Negative for back pain. Skin: Negative for rash. Neurological: Negative for headaches, areas of focal weakness or numbness.    ____________________________________________   PHYSICAL EXAM:  VITAL SIGNS: ED Triage Vitals  Enc Vitals Group     BP 01/31/18 1329 131/66     Pulse Rate 01/31/18 1329 81     Resp 01/31/18 1329 (!) 22     Temp 01/31/18 1329 98.3 F (36.8 C)     Temp Source 01/31/18 1329 Oral     SpO2 01/31/18 1329 94 %     Weight 01/31/18 1329 233 lb (105.7 kg)     Height 01/31/18 1329 5\' 4"  (1.626 m)     Head Circumference --      Peak Flow --      Pain Score 01/31/18 1404 0     Pain Loc --      Pain Edu? --      Excl. in [place]? --     Constitutional: Alert and oriented.  Moderately ill-appearing.  She appears slightly short of breath, frequent dry nonproductive cough and obvious wheezing and  standing next to her. Eyes: Conjunctivae are normal. Head: Atraumatic. Nose: No congestion/rhinnorhea. Mouth/Throat: Mucous membranes are moist. Neck: No stridor.  Cardiovascular: Normal rate, regular rhythm. Grossly normal heart sounds.  Good peripheral circulation. Respiratory: Moderate increased work of breathing, slight use of accessory muscles.  Notable end expiratory wheezing in all lobes without focal rales.  Speaks in phrases.  No tripoding, no fatigue, does not appear to be in acute distress. Gastrointestinal: Soft and nontender. No distention. Musculoskeletal: No lower extremity tenderness nor  edema. Neurologic:  Normal speech and language. No gross focal neurologic deficits are appreciated.  Skin:  Skin is warm, dry and intact. No rash noted. Psychiatric: Mood and affect are normal. Speech and behavior are normal.  ____________________________________________   LABS (all labs ordered are listed, but only abnormal results are displayed)  Labs Reviewed  BASIC METABOLIC PANEL - Abnormal; Notable for the following components:      Result Value   Potassium 3.4 (*)    Glucose, Bld 106 (*)    Calcium 8.6 (*)    GFR calc non Af Amer 60 (*)    All other components within normal limits  PROTIME-INR - Abnormal; Notable for the following components:   Prothrombin Time 24.0 (*)    All other components within normal limits  CBC  TROPONIN I  INFLUENZA PANEL BY PCR (TYPE A & B)   ____________________________________________  EKG  Reviewed and interpreted at 1335 Heart rate 80 QRS 70 QTc 460 Normal sinus rhythm. Minimal nonspecific T wave abnormality.  No evidence of acute ischemia ____________________________________________  RADIOLOGY  Chest x-ray reviewed, bronchitic changes but no infiltrate ____________________________________________   PROCEDURES  Procedure(s) performed: None  Procedures  Critical Care performed: Yes, see critical care note(s)  CRITICAL  CARE Performed by: Delman Kitten   Total critical care time: 37 minutes  Critical care time was exclusive of separately billable procedures and treating other patients.  Critical care was necessary to treat or prevent imminent or life-threatening deterioration.  Critical care was time spent personally by me on the following activities: development of treatment plan with patient and/or surrogate as well as nursing, discussions with consultants, evaluation of patient's response to treatment, examination of patient, obtaining history from patient or surrogate, ordering and performing treatments and interventions, ordering and review of laboratory studies, ordering and review of radiographic studies, pulse oximetry and re-evaluation of patient's condition.  ____________________________________________   INITIAL IMPRESSION / ASSESSMENT AND PLAN / ED COURSE  Pertinent labs & imaging results that were available during my care of the patient were reviewed by me and considered in my medical decision making (see chart for details).   Patient with moderate increased work of breathing, notable end expiratory wheezing frequent dry cough.  Seen by her primary care doctor referred to the ER after nebulizer treatment and ongoing work of breathing.  Will treat aggressively, additional nebulizers, IV magnesium given her history of asthma, and also IV steroid.  Continue to monitor the patient closely.  No evidence of acute cardiac etiology.  No chest pain.  Afebrile, will double check influenza however.  No white count, chest x-ray shows no infiltrate I highly doubt an acute pneumonia.  Likely asthma, probably exacerbated by viral illness.  No chest pain.  No pleuritic pain.  No leg swelling.  Clinical Course as of Feb 01 1812  Thu Jan 31, 2018  1631 Currently completing nebulizer therapy.  Awake and fully alert.   [MQ]  3614 Patient reports she is starting to feel little better.  She does appear slightly  improved and her expiratory wheezing is not near as apparent.  On reexamination she does continue to have slight end expiratory wheezing, her work of breathing does appear to be improving.  Will give additional nebulizer treatment, reevaluate thereafter.   [MQ]    Clinical Course User Index [MQ] Delman Kitten, MD   ----------------------------------------- 6:14 PM on 01/31/2018 -----------------------------------------   Patient able to ambulate, still wheezing.  Ambulated to the bathroom back.  Upon return  again noted accessory muscle use, and expiratory wheezing, frequent coughing and oxygen saturation on room air dipping down to about 90%.  I have ordered 6 nebulizer treatment, she does report slowly feeling somewhat better but still apparent increased work of breathing.  At this time given aggressive therapy with ongoing respiratory symptoms, will admit for further care and work-up.  INR therapeutic significantly lowering concern for any other process such as pulmonary embolism which are current presentation does not support  ____________________________________________   FINAL CLINICAL IMPRESSION(S) / ED DIAGNOSES  Final diagnoses:  Moderate persistent asthma with exacerbation        Note:  This document was prepared using Dragon voice recognition software and may include unintentional dictation errors       Delman Kitten, MD 01/31/18 1815

## 2018-01-31 NOTE — Telephone Encounter (Signed)
Refill was sent over when Tina Duarte closed

## 2018-01-31 NOTE — Telephone Encounter (Signed)
Pt aware and did not request medication

## 2018-01-31 NOTE — ED Notes (Signed)
[facility] sent to lab

## 2018-01-31 NOTE — Progress Notes (Signed)
[name], female   DOB: [birth year], 73 y.o.   MRN: [id number]   Subjective:    [name], female    DOB: Jan 31, 1946, 73 y.o.   MRN: [id number]  HPI  Patient here for a scheduled follow up.  Reports she has had persistent cough for weeks, but over the last week has had increased cough and congestion.  Increased wheezing and sob.  Taking mucinex and nyquil.  Not helping.  Has been eating.  Loose stool, but no vomiting or nausea.  Was on prednisone several weeks ago. Again symptoms worsened over the last week.     Past Medical History:  Diagnosis Date  . Allergy   . Asthma   . Chicken pox   . History of blood clots   . Hypercholesterolemia   . Hypertension    Past Surgical History:  Procedure Laterality Date  . blood clots  2008   Family History  Problem Relation Age of Onset  . Cancer Mother        Breast  . Heart disease Mother   . Stroke Mother   . Hypertension Mother   . Breast cancer Mother        68's  . Heart disease Father   . Stroke Father   . Hypertension Father   . Diabetes Father   . Colon cancer Other        paternal cousin   Social History   Socioeconomic History  . Marital status: Widowed    Spouse name: Not on file  . Number of children: Not on file  . Years of education: Not on file  . Highest education level: Not on file  Occupational History  . Not on file  Social Needs  . Financial resource strain: Not on file  . Food insecurity:    Worry: Not on file    Inability: Not on file  . Transportation needs:    Medical: Not on file    Non-medical: Not on file  Tobacco Use  . Smoking status: Never Smoker  . Smokeless tobacco: Never Used  Substance and Sexual Activity  . Alcohol use: No    Alcohol/week: 0.0 standard drinks  . Drug use: No  . Sexual activity: Not on file  Lifestyle  . Physical activity:    Days per week: Not on file    Minutes per session: Not on file  . Stress: Not on file  Relationships  .  Social connections:    Talks on phone: Not on file    Gets together: Not on file    Attends religious service: Not on file    Active member of club or organization: Not on file    Attends meetings of clubs or organizations: Not on file    Relationship status: Not on file  Other Topics Concern  . Not on file  Social History Narrative  . Not on file    Outpatient Encounter Medications as of 01/31/2018  Medication Sig  . acetaminophen (TYLENOL) 650 MG CR tablet Take 650 mg by mouth every 8 (eight) hours as needed for pain.  Marland Kitchen albuterol (PROVENTIL HFA;VENTOLIN HFA) 108 (90 Base) MCG/ACT inhaler Inhale 2 puffs into the lungs every 6 (six) hours as needed for wheezing or shortness of breath.  . cefdinir (OMNICEF) 300 MG capsule Take 1 capsule (300 mg total) by mouth 2 (two) times daily.  . citalopram (CELEXA) 20 MG tablet Take 1 tablet (20 mg total) by mouth  daily.  . ezetimibe (ZETIA) 10 MG tablet Take 1 tablet (10 mg total) by mouth daily.  . fluticasone (FLOVENT HFA) 110 MCG/ACT inhaler Inhale 2 puffs into the lungs 2 (two) times daily. Rinse mouth after use  . losartan (COZAAR) 50 MG tablet TAKE 1 TABLET BY MOUTH ONCE A DAY  . meclizine (ANTIVERT) 25 MG tablet Take 1 tablet (25 mg total) by mouth 3 (three) times daily as needed for dizziness.  . nystatin (MYCOSTATIN) 100000 UNIT/ML suspension Take 5 mLs (500,000 Units total) by mouth 3 (three) times daily. (Patient not taking: Reported on 08/20/2017)  . nystatin (NYSTATIN) powder Apply topically 4 (four) times daily. (Patient not taking: Reported on 08/20/2017)  . nystatin cream (MYCOSTATIN) Apply 1 application topically 2 (two) times daily. (Patient not taking: Reported on 08/20/2017)  . predniSONE (DELTASONE) 10 MG tablet Take 6 tablets x 1 day and then decrease by 1/2 tablet per day until down to zero mg.  . rosuvastatin (CRESTOR) 5 MG tablet Take 1 tablet (5 mg total) by mouth daily.  Marland Kitchen warfarin (COUMADIN) 5 MG tablet TAKE 1 TABLET BY MOUTH  ONCE A DAY  . [DISCONTINUED] predniSONE (DELTASONE) 10 MG tablet Take 6 tablets x 1 day and then decrease by 1/2 tablet per day until down to zero mg.   No facility-administered encounter medications on file as of 01/31/2018.     Review of Systems  Constitutional: Positive for fatigue. Negative for fever.  HENT: Positive for congestion. Negative for sinus pressure.   Respiratory: Positive for cough, shortness of breath and wheezing. Negative for chest tightness.   Cardiovascular: Negative for chest pain and leg swelling.  Gastrointestinal: Negative for abdominal pain, nausea and vomiting.       Loose stool   Genitourinary: Negative for difficulty urinating and dysuria.  Musculoskeletal: Negative for joint swelling and myalgias.  Skin: Negative for color change and rash.  Neurological: Negative for dizziness and headaches.  Psychiatric/Behavioral: Negative for agitation and dysphoric mood.       Objective:     Blood pressure rechecked by me:  138/78  Physical Exam Constitutional:      Appearance: Normal appearance.     Comments: Does not appear to feel well.    HENT:     Nose: Nose normal. No congestion.     Mouth/Throat:     Pharynx: No oropharyngeal exudate or posterior oropharyngeal erythema.  Neck:     Musculoskeletal: Neck supple.     Thyroid: No thyromegaly.  Cardiovascular:     Rate and Rhythm: Normal rate and regular rhythm.  Pulmonary:     Breath sounds: Normal breath sounds.     Comments: Audibly wheezing.  Increased congestion.  Increased cough with expiration.  Was given albuterol neb.  Some increased air movement, but still with increased cough and wheezing.   Abdominal:     General: Bowel sounds are normal.     Palpations: Abdomen is soft.     Tenderness: There is no abdominal tenderness.  Musculoskeletal:        General: No swelling or tenderness.  Lymphadenopathy:     Cervical: No cervical adenopathy.  Skin:    Findings: No erythema or rash.    Neurological:     Mental Status: She is alert.  Psychiatric:        Mood and Affect: Mood normal.        Behavior: Behavior normal.     BP 138/72   Pulse 84   Temp 98.4 F (  36.9 C) (Oral)   Resp 18   Wt 232 lb 6.4 oz (105.4 kg)   LMP 04/29/1997   SpO2 95%   BMI 41.17 kg/m  Wt Readings from Last 3 Encounters:  01/31/18 232 lb 6.4 oz (105.4 kg)  11/13/17 230 lb 6.4 oz (104.5 kg)  08/19/17 234 lb (106.1 kg)     Lab Results  Component Value Date   WBC 9.1 11/13/2017   HGB 14.8 11/13/2017   HCT 44.8 11/13/2017   PLT 321.0 11/13/2017   GLUCOSE 96 11/13/2017   CHOL 248 (H) 11/13/2017   TRIG 245.0 (H) 11/13/2017   HDL 55.50 11/13/2017   LDLDIRECT 174.0 11/13/2017   LDLCALC 72 04/10/2017   ALT 19 11/13/2017   AST 22 11/13/2017   NA 137 11/13/2017   K 4.4 11/13/2017   CL 101 11/13/2017   CREATININE 1.04 11/13/2017   BUN 13 11/13/2017   CO2 29 11/13/2017   TSH 4.42 04/10/2017   INR 1.8 (H) 11/13/2017   HGBA1C 6.0 11/13/2017    Mr Brain Wo Contrast (neuro Protocol)  Result Date: 08/20/2017 CLINICAL DATA:  Initial evaluation for acute dizziness, headache. EXAM: MRI HEAD WITHOUT CONTRAST MRA HEAD WITHOUT CONTRAST TECHNIQUE: Multiplanar, multiecho pulse sequences of the brain and surrounding structures were obtained without intravenous contrast. Angiographic images of the head were obtained using MRA technique without contrast. COMPARISON:  Prior CT from 08/19/2017 FINDINGS: MRI HEAD FINDINGS Brain: Examination degraded by motion artifact. Generalized age appropriate cerebral atrophy. Minimal chronic microvascular ischemic changes present within the periventricular white matter. Probable tiny remote cortical infarct noted at the posterior right frontal region (series 17, image 36). No abnormal foci of restricted diffusion to suggest acute or subacute ischemia. Gray-white matter differentiation maintained. No other areas of chronic infarction. No evidence for acute or chronic  intracranial hemorrhage. No mass lesion, midline shift or mass effect. No hydrocephalus. No extra-axial fluid collection. Pituitary gland within normal limits. Vascular: Major intracranial vascular flow voids maintained. Skull and upper cervical spine: Craniocervical junction normal. Upper cervical spine normal. Bone marrow signal intensity within normal limits. No scalp soft tissue abnormality. Sinuses/Orbits: Globes and orbital soft tissues demonstrate no acute finding. Left gaze noted. Paranasal sinuses are clear. Trace opacity bilateral mastoid air cells, of doubtful significance. Inner ear structures grossly normal. Other: None. MRA HEAD FINDINGS ANTERIOR CIRCULATION: Examination moderately degraded by motion artifact. Distal cervical segments of the internal carotid arteries are patent with antegrade flow. Petrous segments patent bilaterally. Cavernous and supraclinoid ICAs grossly patent without obvious stenosis. ICA termini poorly assessed. A1 segments grossly patent. Anterior communicating artery not well assessed. Anterior cerebral arteries patent distally, poorly evaluated proximally due to extensive motion. M1 segments grossly patent without obvious stenosis, although evaluation limited due to motion. MCA bifurcations not well assessed. Distal MCA branches perfused and symmetric POSTERIOR CIRCULATION: Vertebral arteries patent to the vertebrobasilar junction. Atheromatous irregularity with mild stenosis within the distal right V4 segment. Left vertebral artery slightly dominant. Left PICA patent proximally. Right PICA not visualized. Basilar patent to its distal aspect without stenosis. Superior cerebral arteries grossly patent proximally. Both of the posterior cerebral arteries supplied via the basilar and are grossly patent proximally, not well assessed distally. No obvious intracranial aneurysm or other vascular abnormality. IMPRESSION: MRI HEAD IMPRESSION: 1. No acute intracranial abnormality  identified. 2. 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 MRA HEAD IMPRESSION: 1. Severely limited study due to motion artifact. 2. Negative intracranial no  obvious high-grade or correctable stenosis identified. Electronically Signed   By: Jeannine Boga M.D.   On: 08/20/2017 03:52   Mr Jodene Nam Head (cerebral Arteries)  Result Date: 08/20/2017 CLINICAL DATA:  Initial evaluation for acute dizziness, headache. EXAM: MRI HEAD WITHOUT CONTRAST MRA HEAD WITHOUT CONTRAST TECHNIQUE: Multiplanar, multiecho pulse sequences of the brain and surrounding structures were obtained without intravenous contrast. Angiographic images of the head were obtained using MRA technique without contrast. COMPARISON:  Prior CT from 08/19/2017 FINDINGS: MRI HEAD FINDINGS Brain: Examination degraded by motion artifact. Generalized age appropriate cerebral atrophy. Minimal chronic microvascular ischemic changes present within the periventricular white matter. Probable tiny remote cortical infarct noted at the posterior right frontal region (series 17, image 36). No abnormal foci of restricted diffusion to suggest acute or subacute ischemia. Gray-white matter differentiation maintained. No other areas of chronic infarction. No evidence for acute or chronic intracranial hemorrhage. No mass lesion, midline shift or mass effect. No hydrocephalus. No extra-axial fluid collection. Pituitary gland within normal limits. Vascular: Major intracranial vascular flow voids maintained. Skull and upper cervical spine: Craniocervical junction normal. Upper cervical spine normal. Bone marrow signal intensity within normal limits. No scalp soft tissue abnormality. Sinuses/Orbits: Globes and orbital soft tissues demonstrate no acute finding. Left gaze noted. Paranasal sinuses are clear. Trace opacity bilateral mastoid air cells, of doubtful significance. Inner ear structures grossly normal. Other:  None. MRA HEAD FINDINGS ANTERIOR CIRCULATION: Examination moderately degraded by motion artifact. Distal cervical segments of the internal carotid arteries are patent with antegrade flow. Petrous segments patent bilaterally. Cavernous and supraclinoid ICAs grossly patent without obvious stenosis. ICA termini poorly assessed. A1 segments grossly patent. Anterior communicating artery not well assessed. Anterior cerebral arteries patent distally, poorly evaluated proximally due to extensive motion. M1 segments grossly patent without obvious stenosis, although evaluation limited due to motion. MCA bifurcations not well assessed. Distal MCA branches perfused and symmetric POSTERIOR CIRCULATION: Vertebral arteries patent to the vertebrobasilar junction. Atheromatous irregularity with mild stenosis within the distal right V4 segment. Left vertebral artery slightly dominant. Left PICA patent proximally. Right PICA not visualized. Basilar patent to its distal aspect without stenosis. Superior cerebral arteries grossly patent proximally. Both of the posterior cerebral arteries supplied via the basilar and are grossly patent proximally, not well assessed distally. No obvious intracranial aneurysm or other vascular abnormality. IMPRESSION: MRI HEAD IMPRESSION: 1. No acute intracranial abnormality identified. 2. 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 MRA HEAD IMPRESSION: 1. Severely limited study due to motion artifact. 2. Negative intracranial no obvious high-grade or correctable stenosis identified. Electronically Signed   By: Jeannine Boga M.D.   On: 08/20/2017 03:52   Ct Head Code Stroke Wo Contrast  Result Date: 08/19/2017 CLINICAL DATA:  Code stroke. Initial evaluation for acute headache, nausea vomiting. EXAM: CT HEAD WITHOUT CONTRAST TECHNIQUE: Contiguous axial images were obtained from the base of the skull through the vertex without intravenous  contrast. COMPARISON:  Prior CT from 03/28/2011. FINDINGS: Brain: Age-related cerebral atrophy. No acute intracranial hemorrhage. No acute large vessel territory infarct. No mass lesion, midline shift or mass effect. No hydrocephalus. No extra-axial fluid collection. Vascular: No asymmetric hyperdense vessel. Scattered vascular calcifications noted within the carotid siphons. Skull: Scalp soft tissues and calvarium within normal limits. Sinuses/Orbits: Globes and orbital soft tissues demonstrate no acute finding. Left gaze noted. Mild mucosal thickening within the right maxillary sinus. Paranasal sinuses are otherwise clear. No mastoid effusion. ASPECTS [facility] Stroke Program Early  CT Score) - Ganglionic level infarction (caudate, lentiform nuclei, internal capsule, insula, M1-M3 cortex): 7 - Supraganglionic infarction (M4-M6 cortex): 3 Total score (0-10 with 10 being normal): 10 IMPRESSION: 1. No acute intracranial infarct or other abnormality identified. 2. ASPECTS is 10. Critical Value/emergent results were called by telephone at the time of interpretation on 08/19/2017 at 11:30 pm to Dr. Darel Hong , who verbally acknowledged these results. Electronically Signed   By: Jeannine Boga M.D.   On: 08/19/2017 23:33       Assessment & Plan:   Problem List Items Addressed This Visit    Asthma    Has a documented history of asthma.  With increased cough, congestion, wheezing and sob.  Weakness.  Hard for her to get up and walk down hall.  Normally able to move around well and is active.  Was given albuterol neb here in the office.  Still with increased tightness and wheezing.  Concern regarding persistent sob and weakness over her going home.  Discussed with pt.  Feels she needs ER evaluation.  Pt in agreement.  Declines ambulance transportation.  Son to take to ER.  ER notified.        Relevant Medications   predniSONE (DELTASONE) 10 MG tablet   fluticasone (FLOVENT HFA) 110 MCG/ACT inhaler    albuterol (PROVENTIL HFA;VENTOLIN HFA) 108 (90 Base) MCG/ACT inhaler   Cough - Primary   Relevant Orders   DG Chest 2 View   Essential hypertension, benign    Blood pressure on recheck improved.        Relevant Orders   CBC with Differential/Platelet   Basic metabolic panel   History of blood clots    On coumadin.  Due pt/inr.        Hypercholesterolemia   Hyperglycemia    Follow met b and a1c.       Long term current use of anticoagulant therapy   Relevant Orders   Protime-INR      I spent over 40 minutes with the patient and more than 50% of the time was spent in consultation regarding the above.  Time spent discussing her current symptoms and concerns.  Time also spent discussing treatment and further evaluation.    Einar Pheasant, MD

## 2018-01-31 NOTE — H&P (Signed)
[place] [facility] [place] NAME: Tina Duarte    MR#:  [id number]  DATE OF BIRTH:  06-22-1945  DATE OF ADMISSION:  01/31/2018  PRIMARY CARE PHYSICIAN: Einar Pheasant, MD   REQUESTING/REFERRING PHYSICIAN: Dr. Jacqualine Code  CHIEF COMPLAINT:   Chief Complaint  Patient presents with  . Shortness of Breath    HISTORY OF PRESENT ILLNESS:  Tina Duarte  is a 73 y.o. female with a known history of hypertension, asthma presents to the hospital due to worsening shortness of breath, chest congestion and wheezing.  Afebrile.  Chest x-ray did not show any infiltrate.  Here patient was given multiple nebulizers and IV steroids with no significant improvement.  She got extremely short of breath with 1-2 steps in the emergency room and had to sit back.  Influenza AB negative.  Patient is being admitted due to severe shortness of breath and acute bronchitis. Patient did desat into low 80s and was placed on oxygen in the emergency room.  PAST MEDICAL HISTORY:   Past Medical History:  Diagnosis Date  . Allergy   . Asthma   . Chicken pox   . History of blood clots   . Hypercholesterolemia   . Hypertension     PAST SURGICAL HISTORY:   Past Surgical History:  Procedure Laterality Date  . blood clots  2008    SOCIAL HISTORY:   Social History   Tobacco Use  . Smoking status: Never Smoker  . Smokeless tobacco: Never Used  Substance Use Topics  . Alcohol use: No    Alcohol/week: 0.0 standard drinks    FAMILY HISTORY:   Family History  Problem Relation Age of Onset  . Cancer Mother        Breast  . Heart disease Mother   . Stroke Mother   . Hypertension Mother   . Breast cancer Mother        6's  . Heart disease Father   . Stroke Father   . Hypertension Father   . Diabetes Father   . Colon cancer Other        paternal cousin    DRUG ALLERGIES:  No Known Allergies  REVIEW OF SYSTEMS:   Review of Systems  Constitutional: Positive for chills  and malaise/fatigue. Negative for fever.  HENT: Negative for sore throat.   Eyes: Negative for blurred vision, double vision and pain.  Respiratory: Positive for cough, shortness of breath and wheezing. Negative for hemoptysis.   Cardiovascular: Negative for chest pain, palpitations, orthopnea and leg swelling.  Gastrointestinal: Negative for abdominal pain, constipation, diarrhea, heartburn, nausea and vomiting.  Genitourinary: Negative for dysuria and hematuria.  Musculoskeletal: Negative for back pain and joint pain.  Skin: Negative for rash.  Neurological: Negative for sensory change, speech change, focal weakness and headaches.  Endo/Heme/Allergies: Does not bruise/bleed easily.  Psychiatric/Behavioral: Negative for depression. The patient is not nervous/anxious.     MEDICATIONS AT HOME:   Prior to Admission medications   Medication Sig Start Date End Date Taking? Authorizing Provider  acetaminophen (TYLENOL) 650 MG CR tablet Take 650 mg by mouth every 8 (eight) hours as needed for pain.   Yes [provider]  citalopram (CELEXA) 20 MG tablet Take 1 tablet (20 mg total) by mouth daily. 01/16/18  Yes Einar Pheasant, MD  losartan (COZAAR) 50 MG tablet TAKE 1 TABLET BY MOUTH ONCE A DAY 01/16/18  Yes Einar Pheasant, MD  rosuvastatin (CRESTOR) 5 MG tablet Take 1 tablet (5  mg total) by mouth daily. 01/24/18  Yes Einar Pheasant, MD  warfarin (COUMADIN) 5 MG tablet TAKE 1 TABLET BY MOUTH ONCE A DAY Patient taking differently: Take 2.5-5 mg by mouth daily. Take 2.5 mg Sunday- Friday, Takes 5 mg on Saturday 01/16/18  Yes Einar Pheasant, MD  fluticasone (FLOVENT HFA) 110 MCG/ACT inhaler Inhale 2 puffs into the lungs 2 (two) times daily. Rinse mouth after use Patient not taking: Reported on 01/31/2018 01/31/18   Einar Pheasant, MD     VITAL SIGNS:  Blood pressure 126/61, pulse 85, temperature 98.3 F (36.8 C), temperature source Oral, resp. rate 20, height 5\' 4"  (1.626 m), weight  105.7 kg, last menstrual period 04/29/1997, SpO2 96 %.  PHYSICAL EXAMINATION:  Physical Exam  GENERAL:  73 y.o.-year-old patient lying in the bed with conversational dyspnea EYES: Pupils equal, round, reactive to light and accommodation. No scleral icterus. Extraocular muscles intact.  HEENT: Head atraumatic, normocephalic. Oropharynx and nasopharynx clear. No oropharyngeal erythema, moist oral mucosa  NECK:  Supple, no jugular venous distention. No thyroid enlargement, no tenderness.  LUNGS: Bilateral wheezing and decreased air entry.  Increased work of breathing CARDIOVASCULAR: S1, S2 normal. No murmurs, rubs, or gallops.  ABDOMEN: Soft, nontender, nondistended. Bowel sounds present. No organomegaly or mass.  EXTREMITIES: No pedal edema, cyanosis, or clubbing. + 2 pedal & radial pulses b/l.   NEUROLOGIC: Cranial nerves II through XII are intact. No focal Motor or sensory deficits appreciated b/l. PSYCHIATRIC: The patient is alert and oriented x 3. Good affect.  SKIN: No obvious rash, lesion, or ulcer.   LABORATORY PANEL:   CBC Recent Labs  Lab 01/31/18 1338  WBC 7.7  HGB 13.3  HCT 42.4  PLT 239   ------------------------------------------------------------------------------------------------------------------  Chemistries  Recent Labs  Lab 01/31/18 1338  NA 138  K 3.4*  CL 104  CO2 26  GLUCOSE 106*  BUN 14  CREATININE 0.94  CALCIUM 8.6*   ------------------------------------------------------------------------------------------------------------------  Cardiac Enzymes Recent Labs  Lab 01/31/18 1338  TROPONINI <0.03   ------------------------------------------------------------------------------------------------------------------  RADIOLOGY:  Dg Chest 2 View  Result Date: 01/31/2018 CLINICAL DATA:  Shortness of breath and wheezing for past 3 days. History of asthma EXAM: CHEST - 2 VIEW COMPARISON:  PA and lateral chest x-ray of November 13, 2017 FINDINGS: The  lungs are well-expanded. There is no focal infiltrate. There is no pleural effusion. The heart and pulmonary vascularity are normal. There is calcification in wall of the aortic arch. The bony thorax exhibits no acute abnormality. IMPRESSION: There is no active cardiopulmonary disease. Mild chronic bronchitic-reactive airway changes. Thoracic aortic atherosclerosis. Electronically Signed   By: David  Martinique M.D.   On: 01/31/2018 14:38     IMPRESSION AND PLAN:   *Acute bronchitis with asthma exacerbation and acute hypoxic respiratory failure -IV steroids - Scheduled Nebulizers - Inhalers -Wean O2 as tolerated - Consult pulmonary if no improvement  *Hypertension.  Continue home medications  *Hypokalemia.  Will replace orally.  *Hyperlipidemia.  Continue home statin dose.  DVT prophylaxis with Lovenox   All the records are reviewed and case discussed with ED provider. Management plans discussed with the patient, family and they are in agreement.  CODE STATUS: Full code  TOTAL TIME TAKING CARE OF THIS PATIENT: 35 minutes.   Leia Alf Osiris Charles M.D on 01/31/2018 at 7:23 PM  Between 7am to 6pm - Pager - [phone]  After 6pm go to [email or web address] - password [email or web address] [facility]  Office  [phone]  CC: Primary  care physician; Einar Pheasant, MD  Note: This dictation was prepared with Dragon dictation along with smaller phrase technology. Any transcriptional errors that result from this process are unintentional.

## 2018-01-31 NOTE — Assessment & Plan Note (Signed)
Blood pressure on recheck improved.

## 2018-01-31 NOTE — Assessment & Plan Note (Signed)
On coumadin.  Due pt/inr.

## 2018-02-01 ENCOUNTER — Ambulatory Visit: Payer: Medicare Other | Admitting: Internal Medicine

## 2018-02-01 LAB — BASIC METABOLIC PANEL
Anion gap: 9 (ref 5–15)
BUN: 14 mg/dL (ref 8–23)
CO2: 21 mmol/L — ABNORMAL LOW (ref 22–32)
Calcium: 8.8 mg/dL — ABNORMAL LOW (ref 8.9–10.3)
Chloride: 108 mmol/L (ref 98–111)
Creatinine, Ser: 0.89 mg/dL (ref 0.44–1.00)
GFR calc Af Amer: >60 mL/min (ref >60)
GFR calc non Af Amer: >60 mL/min (ref >60)
Glucose, Bld: 153 mg/dL — ABNORMAL HIGH (ref 70–99)
POTASSIUM: 4.6 mmol/L (ref 3.5–5.1)
Sodium: 138 mmol/L (ref 135–145)

## 2018-02-01 LAB — CBC
HCT: 42.8 % (ref 36.0–46.0)
Hemoglobin: 13.5 g/dL (ref 12.0–15.0)
MCH: 28.9 pg (ref 26.0–34.0)
MCHC: 31.5 g/dL (ref 30.0–36.0)
MCV: 91.6 fL (ref 80.0–100.0)
Platelets: 236 10*3/uL (ref 150–400)
RBC: 4.67 MIL/uL (ref 3.87–5.11)
RDW: 13 % (ref 11.5–15.5)
WBC: 5.9 10*3/uL (ref 4.0–10.5)
nRBC: 0 % (ref 0.0–0.2)

## 2018-02-01 MED ORDER — GUAIFENESIN-DM 100-10 MG/5ML PO SYRP
5.0000 mL | ORAL_SOLUTION | ORAL | Status: DC | PRN
  Administered 2018-02-01 – 2018-02-03 (×4): 5 mL via ORAL
  Filled 2018-02-01 (×4): qty 5

## 2018-02-01 MED ORDER — TRAZODONE HCL 50 MG PO TABS
50.0000 mg | ORAL_TABLET | Freq: Every evening | ORAL | Status: DC | PRN
  Administered 2018-02-01: 50 mg via ORAL
  Filled 2018-02-01: qty 1

## 2018-02-01 NOTE — Progress Notes (Signed)
[place] [facility] [place] NAME: Tina Duarte    MR#:  [id number]  DATE OF BIRTH:  September 18, 1945  SUBJECTIVE:  CHIEF COMPLAINT:  Pt is coughing and wheezing   REVIEW OF SYSTEMS:  CONSTITUTIONAL: No fever, fatigue or weakness.  EYES: No blurred or double vision.  EARS, NOSE, AND THROAT: No tinnitus or ear pain.  RESPIRATORY: reports cough, some shortness of breath, wheezing , denies hemoptysis.  CARDIOVASCULAR: No chest pain, orthopnea, edema.  GASTROINTESTINAL: No nausea, vomiting, diarrhea or abdominal pain.  GENITOURINARY: No dysuria, hematuria.  ENDOCRINE: No polyuria, nocturia,  HEMATOLOGY: No anemia, easy bruising or bleeding SKIN: No rash or lesion. MUSCULOSKELETAL: No joint pain or arthritis.   NEUROLOGIC: No tingling, numbness, weakness.  PSYCHIATRY: No anxiety or depression.   DRUG ALLERGIES:  No Known Allergies  VITALS:  Blood pressure 127/63, pulse 79, temperature 98.1 F (36.7 C), temperature source Oral, resp. rate 20, height 5\' 4"  (1.626 m), weight 104.9 kg, last menstrual period 04/29/1997, SpO2 95 %.  PHYSICAL EXAMINATION:  GENERAL:  73 y.o.-year-old patient lying in the bed with no acute distress.  EYES: Pupils equal, round, reactive to light and accommodation. No scleral icterus. Extraocular muscles intact.  HEENT: Head atraumatic, normocephalic. Oropharynx and nasopharynx clear.  NECK:  Supple, no jugular venous distention. No thyroid enlargement, no tenderness.  LUNGS: mod breath sounds bilaterally, diffuse wheezing, no rales,rhonchi or crepitation. No use of accessory muscles of respiration.  CARDIOVASCULAR: S1, S2 normal. No murmurs, rubs, or gallops.  ABDOMEN: Soft, nontender, nondistended. Bowel sounds present. No organomegaly or mass.  EXTREMITIES: No pedal edema, cyanosis, or clubbing.  NEUROLOGIC: awake and alert Sensation intact. Gait not checked.  PSYCHIATRIC: The patient is alert and oriented x 3.  SKIN: No  obvious rash, lesion, or ulcer.    LABORATORY PANEL:   CBC Recent Labs  Lab 02/01/18 0545  WBC 5.9  HGB 13.5  HCT 42.8  PLT 236   ------------------------------------------------------------------------------------------------------------------  Chemistries  Recent Labs  Lab 02/01/18 0545  NA 138  K 4.6  CL 108  CO2 21*  GLUCOSE 153*  BUN 14  CREATININE 0.89  CALCIUM 8.8*   ------------------------------------------------------------------------------------------------------------------  Cardiac Enzymes Recent Labs  Lab 01/31/18 1338  TROPONINI <0.03   ------------------------------------------------------------------------------------------------------------------  RADIOLOGY:  Dg Chest 2 View  Result Date: 01/31/2018 CLINICAL DATA:  Shortness of breath and wheezing for past 3 days. History of asthma EXAM: CHEST - 2 VIEW COMPARISON:  PA and lateral chest x-ray of November 13, 2017 FINDINGS: The lungs are well-expanded. There is no focal infiltrate. There is no pleural effusion. The heart and pulmonary vascularity are normal. There is calcification in wall of the aortic arch. The bony thorax exhibits no acute abnormality. IMPRESSION: There is no active cardiopulmonary disease. Mild chronic bronchitic-reactive airway changes. Thoracic aortic atherosclerosis. Electronically Signed   By: David  Martinique M.D.   On: 01/31/2018 14:38    EKG:   Orders placed or performed during the hospital encounter of 01/31/18  . EKG 12-Lead  . EKG 12-Lead  . ED EKG within 10 minutes  . ED EKG within 10 minutes  . EKG    ASSESSMENT AND PLAN:    *Acute hypoxic respiratory failure secondary to acute bronchitis with asthma exacerbation  -Continue IV steroids, taper to p.o. prednisone when patient is clinically better - Scheduled Nebulizers -Wean O2 as tolerated - Consult pulmonary if no improvement  *Hypertension.    Blood pressure is stable continue home  medications  *Hypokalemia.    Repleted.  Potassium at 4.6*Hyperlipidemia.  Continue home statin dose.  DVT prophylaxis with Lovenox    All the records are reviewed and case discussed with Care Management/Social Workerr. Management plans discussed with the patient, family and they are in agreement.  CODE STATUS:   TOTAL TIME TAKING CARE OF THIS PATIENT: 33  minutes.   POSSIBLE D/C IN 2  DAYS, DEPENDING ON CLINICAL CONDITION.  Note: This dictation was prepared with Dragon dictation along with smaller phrase technology. Any transcriptional errors that result from this process are unintentional.   Nicholes Mango M.D on 02/01/2018 at 4:24 PM  Between 7am to 6pm - Pager - [phone] After 6pm go to [email or web address] - password [email or web address] [facility]  Office  [phone]  CC: Primary care physician; Einar Pheasant, MD

## 2018-02-02 MED ORDER — METHYLPREDNISOLONE SODIUM SUCC 40 MG IJ SOLR: 40.0000 mg | Freq: Two times a day (BID) | INTRAMUSCULAR | Status: DC

## 2018-02-02 MED ORDER — ENOXAPARIN SODIUM 40 MG/0.4ML ~~LOC~~ SOLN
40.0000 mg | SUBCUTANEOUS | Status: DC
  Administered 2018-02-02 – 2018-02-03 (×2): 40 mg via SUBCUTANEOUS
  Filled 2018-02-02 (×3): qty 0.4

## 2018-02-02 MED ORDER — DOXYCYCLINE HYCLATE 100 MG PO TABS
100.0000 mg | ORAL_TABLET | Freq: Two times a day (BID) | ORAL | Status: DC
  Administered 2018-02-02 – 2018-02-04 (×5): 100 mg via ORAL
  Filled 2018-02-02 (×5): qty 1

## 2018-02-02 MED ORDER — FLORANEX PO PACK
1.0000 g | PACK | Freq: Three times a day (TID) | ORAL | Status: DC
  Administered 2018-02-02 – 2018-02-04 (×5): 1 g via ORAL
  Filled 2018-02-02 (×8): qty 1

## 2018-02-02 MED ORDER — METHYLPREDNISOLONE SODIUM SUCC 40 MG IJ SOLR
40.0000 mg | Freq: Two times a day (BID) | INTRAMUSCULAR | Status: DC
  Administered 2018-02-02 – 2018-02-04 (×4): 40 mg via INTRAVENOUS
  Filled 2018-02-02 (×4): qty 1

## 2018-02-02 NOTE — Progress Notes (Signed)
Called Dr. Jerelyn Charles regarding patient request for probiotics due to antibiotics.  Appropriate orders were placed.  Tina Duarte  02/02/2018  9:16 PM

## 2018-02-02 NOTE — Progress Notes (Signed)
[place] [facility] [place] NAME: Tina Duarte    MR#:  [id number]  DATE OF BIRTH:  09/16/1945  SUBJECTIVE:   sob wheezing thsi am  REVIEW OF SYSTEMS:    Review of Systems  Constitutional: Negative for fever, chills weight loss HENT: Negative for ear pain, nosebleeds, congestion, facial swelling, rhinorrhea, neck pain, neck stiffness and ear discharge.   Respiratory:++ cough, shortness of breath, wheezing  Cardiovascular: Negative for chest pain, palpitations and leg swelling.  Gastrointestinal: Negative for heartburn, abdominal pain, vomiting, diarrhea or consitpation Genitourinary: Negative for dysuria, urgency, frequency, hematuria Musculoskeletal: Negative for back pain or joint pain Neurological: Negative for dizziness, seizures, syncope, focal weakness,  numbness and headaches.  Hematological: Does not bruise/bleed easily.  Psychiatric/Behavioral: Negative for hallucinations, confusion, dysphoric mood    Tolerating Diet: yes      DRUG ALLERGIES:  No Known Allergies  VITALS:  Blood pressure (!) 118/55, pulse 75, temperature 97.7 F (36.5 C), temperature source Oral, resp. rate 16, height 5\' 4"  (1.626 m), weight 104.9 kg, last menstrual period 04/29/1997, SpO2 92 %.  PHYSICAL EXAMINATION:  Constitutional: Appears well-developed and well-nourished. No distress. HENT: Normocephalic. Marland Kitchen Oropharynx is clear and moist.  Eyes: Conjunctivae and EOM are normal. PERRLA, no scleral icterus.  Neck: Normal ROM. Neck supple. No JVD. No tracheal deviation. CVS: RRR, S1/S2 +, no murmurs, no gallops, no carotid bruit.  Pulmonary: Effort and breath sounds normal, no stridor, rhonchi, ++b/l wheezes,  NO rales.  Abdominal: Soft. BS +,  no distension, tenderness, rebound or guarding.  Musculoskeletal: Normal range of motion. No edema and no tenderness.  Neuro: Alert. CN 2-12 grossly intact. No focal deficits. Skin: Skin is warm and dry. No rash  noted. Psychiatric: Normal mood and affect.      LABORATORY PANEL:   CBC Recent Labs  Lab 02/01/18 0545  WBC 5.9  HGB 13.5  HCT 42.8  PLT 236   ------------------------------------------------------------------------------------------------------------------  Chemistries  Recent Labs  Lab 02/01/18 0545  NA 138  K 4.6  CL 108  CO2 21*  GLUCOSE 153*  BUN 14  CREATININE 0.89  CALCIUM 8.8*   ------------------------------------------------------------------------------------------------------------------  Cardiac Enzymes Recent Labs  Lab 01/31/18 1338  TROPONINI <0.03   ------------------------------------------------------------------------------------------------------------------  RADIOLOGY:  Dg Chest 2 View  Result Date: 01/31/2018 CLINICAL DATA:  Shortness of breath and wheezing for past 3 days. History of asthma EXAM: CHEST - 2 VIEW COMPARISON:  PA and lateral chest x-ray of November 13, 2017 FINDINGS: The lungs are well-expanded. There is no focal infiltrate. There is no pleural effusion. The heart and pulmonary vascularity are normal. There is calcification in wall of the aortic arch. The bony thorax exhibits no acute abnormality. IMPRESSION: There is no active cardiopulmonary disease. Mild chronic bronchitic-reactive airway changes. Thoracic aortic atherosclerosis. Electronically Signed   By: David  Martinique M.D.   On: 01/31/2018 14:38     ASSESSMENT AND PLAN:   73 year old female with a history of asthma who presents with shortness of breath.  1.  Acute hypoxic respiratory failure in the setting of acute bronchitis with asthma exacerbation: Taper steroids and continue nebs Doxycycline for acute bronchitis Wean oxygen  2.  Essential hypertension: Continue losartan  3.  Depression: Continue Celexa trazodone 4.  Hyperlipidemia: Continue Crestor  Management plans discussed with the patient and she is in agreement.  CODE STATUS: full  TOTAL TIME TAKING  CARE OF THIS PATIENT: 30 minutes.     POSSIBLE D/C 1-2 days,  DEPENDING ON CLINICAL CONDITION.   Senaya Dicenso M.D on 02/02/2018 at 11:56 AM  Between 7am to 6pm - Pager - [phone] After 6pm go to [email or web address] - password [email or web address] [facility]  Office  [phone]  CC: Primary care physician; Einar Pheasant, MD  Note: This dictation was prepared with Dragon dictation along with smaller phrase technology. Any transcriptional errors that result from this process are unintentional.

## 2018-02-02 NOTE — Evaluation (Signed)
Physical Therapy Evaluation Patient Details Name: Tina Duarte MRN: [id number] DOB: 1945/02/20 Today's Date: 02/02/2018   History of Present Illness  Patient is a 73 year old female admitted from home with SOB and found to have bronchitis. Patient has PMH to include: asthma, HTN, depression and HLD.       Clinical Impression  Patient using BSC upon arrival, finishing breathing treatment, agrees to walk. Patient reports she is feeling a little better but would like to get over this. Patient demonstrates modified independence with transfers, able to ambulate 150 feet with rw and supervision. Ambulated on 2 lpm O2. sats remained > 94% throughout session. Patient is coughing during treatment and reports sob after ambulation. Patient will benefit from continued skilled PT to address her weakness, and decreased activity tolerance.     Follow Up Recommendations Home health PT    Equipment Recommendations  None recommended by PT    Recommendations for Other Services       Precautions / Restrictions Precautions Precautions: None Restrictions Weight Bearing Restrictions: No      Mobility  Bed Mobility               General bed mobility comments: patient seated upon arrival and remained seated on eob when leaving  Transfers Overall transfer level: Modified independent Equipment used: Rolling walker (2 wheeled)             General transfer comment: no physical assist needed  Ambulation/Gait Ambulation/Gait assistance: Modified independent (Device/Increase time) Gait Distance (Feet): 150 Feet Assistive device: Rolling walker (2 wheeled) Gait Pattern/deviations: Step-through pattern Gait velocity: decreased   General Gait Details: good cadence, 1 minor lob wheile walking and talking to staff at desk. Able to recover independently. SOB with ambulation.    Stairs            Wheelchair Mobility    Modified Rankin (Stroke Patients Only)       Balance Overall  balance assessment: Modified Independent                                           Pertinent Vitals/Pain Pain Assessment: No/denies pain    Home Living Family/patient expects to be discharged to:: Private residence Living Arrangements: Children Available Help at Discharge: Family Type of Home: House Home Access: Level entry     Home Layout: One level Home Equipment: Environmental consultant - 2 wheels      Prior Function Level of Independence: Independent               Hand Dominance        Extremity/Trunk Assessment   Upper Extremity Assessment Upper Extremity Assessment: Overall WFL for tasks assessed    Lower Extremity Assessment Lower Extremity Assessment: Overall WFL for tasks assessed       Communication   Communication: No difficulties  Cognition Arousal/Alertness: Awake/alert Behavior During Therapy: WFL for tasks assessed/performed Overall Cognitive Status: Within Functional Limits for tasks assessed                                        General Comments      Exercises     Assessment/Plan    PT Assessment Patient needs continued PT services  PT Problem List Decreased strength;Decreased mobility;Decreased activity tolerance;Cardiopulmonary status limiting activity  PT Treatment Interventions Gait training;Therapeutic activities;Therapeutic exercise;Patient/family education;Functional mobility training    PT Goals (Current goals can be found in the Care Plan section)  Acute Rehab PT Goals Patient Stated Goal: to return home, feel better PT Goal Formulation: With patient Time For Goal Achievement: 02/16/18 Potential to Achieve Goals: Good    Frequency Min 2X/week   Barriers to discharge        Co-evaluation               AM-PAC PT "6 Clicks" Mobility  Outcome Measure Help needed turning from your back to your side while in a flat bed without using bedrails?: A Little Help needed moving from lying on  your back to sitting on the side of a flat bed without using bedrails?: A Little Help needed moving to and from a bed to a chair (including a wheelchair)?: A Little Help needed standing up from a chair using your arms (e.g., wheelchair or bedside chair)?: None Help needed to walk in hospital room?: A Little Help needed climbing 3-5 steps with a railing? : A Lot 6 Click Score: 18    End of Session Equipment Utilized During Treatment: Gait belt Activity Tolerance: Patient tolerated treatment well;Patient limited by fatigue Patient left: in bed Nurse Communication: Mobility status PT Visit Diagnosis: Muscle weakness (generalized) (M62.81);Difficulty in walking, not elsewhere classified (R26.2)    Time: 1355-1420 PT Time Calculation (min) (ACUTE ONLY): 25 min   Charges:   PT Evaluation $PT Eval Moderate Complexity: 1 Mod PT Treatments $Therapeutic Activity: 23-37 mins        Aston Lawhorn, PT, GCS 02/02/18,2:40 PM

## 2018-02-03 MED ORDER — HYDROCOD POLST-CPM POLST ER 10-8 MG/5ML PO SUER
5.0000 mL | Freq: Two times a day (BID) | ORAL | Status: DC | PRN
  Administered 2018-02-03 – 2018-02-04 (×2): 5 mL via ORAL
  Filled 2018-02-03 (×2): qty 5

## 2018-02-03 MED ORDER — BENZONATATE 100 MG PO CAPS
200.0000 mg | ORAL_CAPSULE | Freq: Three times a day (TID) | ORAL | Status: DC
  Administered 2018-02-03 – 2018-02-04 (×4): 200 mg via ORAL
  Filled 2018-02-03 (×4): qty 2

## 2018-02-03 NOTE — Progress Notes (Signed)
[place] at [place] NAME: Tina Duarte    MR#:  [id number]  DATE OF BIRTH:  1945-04-27  SUBJECTIVE:  Still with wheezing however improved.  Has a cough. REVIEW OF SYSTEMS:    Review of Systems  Constitutional: Negative for fever, chills weight loss HENT: Negative for ear pain, nosebleeds, congestion, facial swelling, rhinorrhea, neck pain, neck stiffness and ear discharge.   Respiratory:++ cough, proved shortness of breath,+wheezing  Cardiovascular: Negative for chest pain, palpitations and leg swelling.  Gastrointestinal: Negative for heartburn, abdominal pain, vomiting, diarrhea or consitpation Genitourinary: Negative for dysuria, urgency, frequency, hematuria Musculoskeletal: Negative for back pain or joint pain Neurological: Negative for dizziness, seizures, syncope, focal weakness,  numbness and headaches.  Hematological: Does not bruise/bleed easily.  Psychiatric/Behavioral: Negative for hallucinations, confusion, dysphoric mood    Tolerating Diet: yes      DRUG ALLERGIES:  No Known Allergies  VITALS:  Blood pressure (!) 134/52, pulse 71, temperature 97.7 F (36.5 C), temperature source Oral, resp. rate 20, height 5\' 4"  (1.626 m), weight 104.9 kg, last menstrual period 04/29/1997, SpO2 96 %.  PHYSICAL EXAMINATION:  Constitutional: Appears well-developed and well-nourished. No distress. HENT: Normocephalic. Marland Kitchen Oropharynx is clear and moist.  Eyes: Conjunctivae and EOM are normal. PERRLA, no scleral icterus.  Neck: Normal ROM. Neck supple. No JVD. No tracheal deviation. CVS: RRR, S1/S2 +, no murmurs, no gallops, no carotid bruit.  Pulmonary: Effort and breath sounds normal, no stridor, rhonchi, faint bilateral expiratory wheezing Abdominal: Soft. BS +,  no distension, tenderness, rebound or guarding.  Musculoskeletal: Normal range of motion. No edema and no tenderness.  Neuro: Alert. CN 2-12 grossly intact. No focal  deficits. Skin: Skin is warm and dry. No rash noted. Psychiatric: Normal mood and affect.      LABORATORY PANEL:   CBC Recent Labs  Lab 02/01/18 0545  WBC 5.9  HGB 13.5  HCT 42.8  PLT 236   ------------------------------------------------------------------------------------------------------------------  Chemistries  Recent Labs  Lab 02/01/18 0545  NA 138  K 4.6  CL 108  CO2 21*  GLUCOSE 153*  BUN 14  CREATININE 0.89  CALCIUM 8.8*   ------------------------------------------------------------------------------------------------------------------  Cardiac Enzymes Recent Labs  Lab 01/31/18 1338  TROPONINI <0.03   ------------------------------------------------------------------------------------------------------------------  RADIOLOGY:  No results found.   ASSESSMENT AND PLAN:   73 year old female with a history of asthma who presents with shortness of breath.  1.  Acute hypoxic respiratory failure in the setting of acute bronchitis with asthma exacerbation: Oxygen has been weaned off.  Continue IV steroids and doxycycline for treatment of acute bronchitis. Continue inhalers Start Tessalon Perles 200 mg p.o. 3 times daily for cough And Tussionex for cough as needed   2.  Essential hypertension: Continue losartan  3.  Depression: Continue Celexa and trazodone 4.  Hyperlipidemia: Continue Crestor  Management plans discussed with the patient and she is in agreement.  CODE STATUS: full  TOTAL TIME TAKING CARE OF THIS PATIENT:24 minutes.   Home health at discharge  POSSIBLE D/C 1-2 days, DEPENDING ON CLINICAL CONDITION.   Skeeter Sheard M.D on 02/03/2018 at 10:50 AM  Between 7am to 6pm - Pager - [phone] After 6pm go to [email or web address] - password [email or web address] [facility]  Office  [phone]  CC: Primary care physician; Einar Pheasant, MD  Note: This dictation was prepared with Dragon dictation along with smaller phrase  technology. Any transcriptional errors that result from this process are unintentional.

## 2018-02-04 MED ORDER — DOXYCYCLINE HYCLATE 100 MG PO TABS: 100.0000 mg | ORAL_TABLET | Freq: Two times a day (BID) | ORAL | 0 refills | Status: AC

## 2018-02-04 MED ORDER — BENZONATATE 200 MG PO CAPS: 200.0000 mg | ORAL_CAPSULE | Freq: Three times a day (TID) | ORAL | 0 refills | Status: DC

## 2018-02-04 MED ORDER — PREDNISONE 20 MG PO TABS: 40.0000 mg | ORAL_TABLET | Freq: Every day | ORAL | 0 refills | Status: DC

## 2018-02-04 MED ORDER — HYDROCOD POLST-CPM POLST ER 10-8 MG/5ML PO SUER: 5.0000 mL | Freq: Two times a day (BID) | ORAL | 0 refills | Status: DC | PRN

## 2018-02-04 NOTE — Discharge Summary (Signed)
[facility] [place] NAME: Tina Duarte    MR#:  [id number]  DATE OF BIRTH:  [birth year]  DATE OF ADMISSION:  01/31/2018 ADMITTING PHYSICIAN: Hillary Bow, MD  DATE OF DISCHARGE: 02/04/2018  PRIMARY CARE PHYSICIAN: Einar Pheasant, MD    ADMISSION DIAGNOSIS:  Moderate persistent asthma with exacerbation [J45.41]  DISCHARGE DIAGNOSIS:  Active Problems:   Acute bronchitis   SECONDARY DIAGNOSIS:   Past Medical History:  Diagnosis Date  . Allergy   . Asthma   . Chicken pox   . History of blood clots   . Hypercholesterolemia   . Hypertension     HOSPITAL COURSE:   73 year old female with a history of asthma who presents with shortness of breath.  1.  Acute hypoxic respiratory failure in the setting of acute bronchitis with asthma exacerbation: Oxygen has been weaned off.    She will continue with oral steroids and doxycycline for treatment of acute bronchitis. Continue Tessalon Perles 200 mg p.o. 3 times daily for cough AndTussionex for cough as needed   2.  Essential hypertension: Continue losartan  3.  Depression: Continue Celexa and trazodone 4.  Hyperlipidemia: Continue Crestor 5.  She is on Coumadin and will need INR checked by her primary care physician in 2 days.  DISCHARGE CONDITIONS AND DIET:   Stable for discharge on regular diet  CONSULTS OBTAINED:    DRUG ALLERGIES:  No Known Allergies  DISCHARGE MEDICATIONS:   Allergies as of 02/04/2018   No Known Allergies     Medication List    TAKE these medications   acetaminophen 650 MG CR tablet Commonly known as:  TYLENOL Take 650 mg by mouth every 8 (eight) hours as needed for pain.   benzonatate 200 MG capsule Commonly known as:  TESSALON Take 1 capsule (200 mg total) by mouth 3 (three) times daily.   chlorpheniramine-HYDROcodone 10-8 MG/5ML Suer Commonly known as:  TUSSIONEX Take 5 mLs by mouth every 12 (twelve) hours as needed for cough.    citalopram 20 MG tablet Commonly known as:  CELEXA Take 1 tablet (20 mg total) by mouth daily.   doxycycline 100 MG tablet Commonly known as:  VIBRA-TABS Take 1 tablet (100 mg total) by mouth every 12 (twelve) hours for 4 days.   fluticasone 110 MCG/ACT inhaler Commonly known as:  FLOVENT HFA Inhale 2 puffs into the lungs 2 (two) times daily. Rinse mouth after use   losartan 50 MG tablet Commonly known as:  COZAAR TAKE 1 TABLET BY MOUTH ONCE A DAY   predniSONE 20 MG tablet Commonly known as:  DELTASONE Take 2 tablets (40 mg total) by mouth daily with breakfast for 4 days.   rosuvastatin 5 MG tablet Commonly known as:  CRESTOR Take 1 tablet (5 mg total) by mouth daily.   warfarin 5 MG tablet Commonly known as:  COUMADIN TAKE 1 TABLET BY MOUTH ONCE A DAY What changed:    how much to take  how to take this  when to take this  additional instructions         Today   CHIEF COMPLAINT:  Patient has a cough however much improved symptoms since admission   VITAL SIGNS:  Blood pressure 121/63, pulse 99, temperature 97.6 F (36.4 C), temperature source Oral, resp. rate 20, height 5\' 4"  (1.626 m), weight 104.9 kg, last menstrual period 04/29/1997, SpO2 96 %.   REVIEW OF SYSTEMS:  Review of Systems  Constitutional: Negative.  Negative for  chills, fever and malaise/fatigue.  HENT: Negative.  Negative for ear discharge, ear pain, hearing loss, nosebleeds and sore throat.   Eyes: Negative.  Negative for blurred vision and pain.  Respiratory: Positive for cough. Negative for hemoptysis, shortness of breath and wheezing.   Cardiovascular: Negative.  Negative for chest pain, palpitations and leg swelling.  Gastrointestinal: Negative.  Negative for abdominal pain, blood in stool, diarrhea, nausea and vomiting.  Genitourinary: Negative.  Negative for dysuria.  Musculoskeletal: Negative.  Negative for back pain.  Skin: Negative.   Neurological: Negative for dizziness,  tremors, speech change, focal weakness, seizures and headaches.  Endo/Heme/Allergies: Negative.  Does not bruise/bleed easily.  Psychiatric/Behavioral: Negative.  Negative for depression, hallucinations and suicidal ideas.     PHYSICAL EXAMINATION:  GENERAL:  73 y.o.-year-old patient lying in the bed with no acute distress.  NECK:  Supple, no jugular venous distention. No thyroid enlargement, no tenderness.  LUNGS: Normal breath sounds bilaterally, no wheezing, rales,rhonchi  No use of accessory muscles of respiration.  CARDIOVASCULAR: S1, S2 normal. No murmurs, rubs, or gallops.  ABDOMEN: Soft, non-tender, non-distended. Bowel sounds present. No organomegaly or mass.  EXTREMITIES: No pedal edema, cyanosis, or clubbing.  PSYCHIATRIC: The patient is alert and oriented x 3.  SKIN: No obvious rash, lesion, or ulcer.   DATA REVIEW:   CBC Recent Labs  Lab 02/01/18 0545  WBC 5.9  HGB 13.5  HCT 42.8  PLT 236    Chemistries  Recent Labs  Lab 02/01/18 0545  NA 138  K 4.6  CL 108  CO2 21*  GLUCOSE 153*  BUN 14  CREATININE 0.89  CALCIUM 8.8*    Cardiac Enzymes Recent Labs  Lab 01/31/18 1338  TROPONINI <0.03    Microbiology Results  @MICRORSLT48 @  RADIOLOGY:  No results found.    Allergies as of 02/04/2018   No Known Allergies     Medication List    TAKE these medications   acetaminophen 650 MG CR tablet Commonly known as:  TYLENOL Take 650 mg by mouth every 8 (eight) hours as needed for pain.   benzonatate 200 MG capsule Commonly known as:  TESSALON Take 1 capsule (200 mg total) by mouth 3 (three) times daily.   chlorpheniramine-HYDROcodone 10-8 MG/5ML Suer Commonly known as:  TUSSIONEX Take 5 mLs by mouth every 12 (twelve) hours as needed for cough.   citalopram 20 MG tablet Commonly known as:  CELEXA Take 1 tablet (20 mg total) by mouth daily.   doxycycline 100 MG tablet Commonly known as:  VIBRA-TABS Take 1 tablet (100 mg total) by mouth every  12 (twelve) hours for 4 days.   fluticasone 110 MCG/ACT inhaler Commonly known as:  FLOVENT HFA Inhale 2 puffs into the lungs 2 (two) times daily. Rinse mouth after use   losartan 50 MG tablet Commonly known as:  COZAAR TAKE 1 TABLET BY MOUTH ONCE A DAY   predniSONE 20 MG tablet Commonly known as:  DELTASONE Take 2 tablets (40 mg total) by mouth daily with breakfast for 4 days.   rosuvastatin 5 MG tablet Commonly known as:  CRESTOR Take 1 tablet (5 mg total) by mouth daily.   warfarin 5 MG tablet Commonly known as:  COUMADIN TAKE 1 TABLET BY MOUTH ONCE A DAY What changed:    how much to take  how to take this  when to take this  additional instructions          Management plans discussed with the patient and she  is in agreement. Stable for discharge home with [facility]  Patient should follow up with pcp  CODE STATUS:     Code Status Orders  (From admission, onward)         Start     Ordered   01/31/18 1922  Full code  Continuous     01/31/18 1922        Code Status History    This patient has a current code status but no historical code status.      TOTAL TIME TAKING CARE OF THIS PATIENT: 38 minutes.    Note: This dictation was prepared with Dragon dictation along with smaller phrase technology. Any transcriptional errors that result from this process are unintentional.  Delesa Kawa M.D on 02/04/2018 at 11:34 AM  Between 7am to 6pm - Pager - [phone] After 6pm go to [email or web address] - password [email or web address]  Office  [phone]  CC: Primary care physician; Einar Pheasant, MD

## 2018-02-04 NOTE — Care Management Note (Signed)
Case Management Note  Patient Details  Name: Tina Duarte MRN: [id number] Date of Birth: 16-Jul-1945   Patient admitted with respiratory failure.  Patient has been weaned to RA. Patient lives at home with son.  PCP [place].  Patient states that her son takes her to all appointments.  PT has assessed patient and recommends home health PT.  Patient agreeable to services.  CMS Medicare.gov Compare Post Acute Care list reviewed with patient.  Patient selected [facility].  Referral made to [facility] with [facility].  RNCM notified that [facility] would not be able to provide OT services at this time.  Patient notified, and she wishes to continue with the referral with [facility].   Subjective/Objective:                    Action/Plan:   Expected Discharge Date:  02/04/18               Expected Discharge Plan:  [place]  In-House Referral:     Discharge planning Services  CM Consult  Post Acute Care Choice:  Home Health Choice offered to:  Patient  DME Arranged:    DME Agency:     HH Arranged:  RN, PT, Nurse's Aide [place] Agency:  [facility]  Status of Service:  Completed, signed off  If discussed at [place] of Stay Meetings, dates discussed:    Additional Comments:  Beverly Sessions, RN 02/04/2018, 11:57 AM

## 2018-02-04 NOTE — Care Management Important Message (Signed)
Copy of signed Medicare IM left with patient in room. 

## 2018-02-05 ENCOUNTER — Telehealth: Payer: Self-pay

## 2018-02-05 NOTE — Telephone Encounter (Signed)
Transition Care Management Follow-up Telephone Call   Date discharged? 02/04/18   How have you been since you were released from the hospital? States she is still coughing.  Wheezing has decreased. Appetite. Currently does not require walker or cane. Denies diarrhea; taking probiotic.    Do you understand why you were in the hospital? Yes, short of breath, wheezing.   Do you understand the discharge instructions? Yes,increase activity as tolerated, check INR in 2 days.   Where were you discharged to? Home.   Items Reviewed:  Medications reviewed: Taking all scheduled medications without issues.   Allergies reviewed: Yes.  Dietary changes reviewed: Yes, heart healthy  Referrals reviewed: Declined occupational therapist.  Home health care in place.    Functional Questionnaire:   Activities of Daily Living (ADLs):   She states they are independent in the following: Ambulating  States they require assistance with the following: Bathing, dressing, meal prep   Any transportation issues/concerns?: Son drives her to appointments.    Any patient concerns? None.    Confirmed importance and date/time of follow-up visits scheduled Yes, 02/06/18 at 12:00.  Provider Appointment booked with Dr. Nicki Reaper, pcp.   Confirmed with patient if condition begins to worsen call PCP or go to the ER.  Patient was given the office number and encouraged to call back with question or concerns.  : Yes.

## 2018-02-06 ENCOUNTER — Encounter: Payer: Self-pay | Admitting: Internal Medicine

## 2018-02-06 ENCOUNTER — Ambulatory Visit (INDEPENDENT_AMBULATORY_CARE_PROVIDER_SITE_OTHER): Payer: Medicare Other | Admitting: Internal Medicine

## 2018-02-06 ENCOUNTER — Telehealth: Payer: Self-pay | Admitting: Internal Medicine

## 2018-02-06 VITALS — BP 140/82 | HR 78 | Temp 97.8°F | Resp 18 | Wt 230.2 lb

## 2018-02-06 DIAGNOSIS — R739 Hyperglycemia, unspecified: Secondary | ICD-10-CM

## 2018-02-06 DIAGNOSIS — J9601 Acute respiratory failure with hypoxia: Secondary | ICD-10-CM

## 2018-02-06 DIAGNOSIS — E876 Hypokalemia: Secondary | ICD-10-CM | POA: Diagnosis not present

## 2018-02-06 DIAGNOSIS — E7800 Pure hypercholesterolemia, unspecified: Secondary | ICD-10-CM | POA: Diagnosis not present

## 2018-02-06 DIAGNOSIS — J4520 Mild intermittent asthma, uncomplicated: Secondary | ICD-10-CM | POA: Diagnosis not present

## 2018-02-06 DIAGNOSIS — Z86718 Personal history of other venous thrombosis and embolism: Secondary | ICD-10-CM

## 2018-02-06 DIAGNOSIS — J45901 Unspecified asthma with (acute) exacerbation: Secondary | ICD-10-CM | POA: Diagnosis not present

## 2018-02-06 DIAGNOSIS — Z7901 Long term (current) use of anticoagulants: Secondary | ICD-10-CM

## 2018-02-06 DIAGNOSIS — J209 Acute bronchitis, unspecified: Secondary | ICD-10-CM

## 2018-02-06 DIAGNOSIS — I10 Essential (primary) hypertension: Secondary | ICD-10-CM | POA: Diagnosis not present

## 2018-02-06 LAB — PROTIME-INR
INR: 1.3 ratio — ABNORMAL HIGH (ref 0.8–1.0)
Prothrombin Time: 14.8 s — ABNORMAL HIGH (ref 9.6–13.1)

## 2018-02-06 LAB — POTASSIUM: Potassium: 4.4 mEq/L (ref 3.5–5.1)

## 2018-02-06 MED ORDER — PREDNISONE 20 MG PO TABS: ORAL_TABLET | ORAL | 0 refills | Status: DC

## 2018-02-06 MED ORDER — PREDNISONE 10 MG PO TABS: ORAL_TABLET | ORAL | 0 refills | Status: DC

## 2018-02-06 NOTE — Telephone Encounter (Unsigned)
Copied from [place] [id number]. Topic: Quick Communication - Home Health Verbal Orders >> Feb 06, 2018 11:35 AM Carolyn Stare wrote: Caller/Agency   [facility] with [facility]  Callback Number [phone]   [id number] verbal orders nursing and home health aide  Frequency  nursing 2 X 1 1 X 3         Home health aide  1 X 1  2 X 2

## 2018-02-06 NOTE — Telephone Encounter (Signed)
Verbal given 

## 2018-02-06 NOTE — Progress Notes (Signed)
[name], female   DOB: [birth year], 73 y.o.   MRN: [id number]   Subjective:    [name], female    DOB: Oct 05, 1945, 73 y.o.   MRN: [id number]  HPI  Patient here for hospital follow up.  She is accompanied by her son.  History obtained from both of them.  She was admitted 01/31/18 - 02/04/18.  Was diagnosed with acute hypoxic respiratory failure in the setting of acute bronchitis with asthma exacerbation.  Oxygen weaned off.  Was discharged on steroids and doxycycline for treatment of acute bronchitis.  Has tessalon perles and cough syrup if needed.  States she still has increased cough and congestion.  Still some sob with exertion.  Is feeling some better, but still with symptoms as outlined.  No nausea or vomiting.  No chest pain.  No abdominal pain.  Bowels stable.     Past Medical History:  Diagnosis Date  . Allergy   . Asthma   . Chicken pox   . History of blood clots   . Hypercholesterolemia   . Hypertension    Past Surgical History:  Procedure Laterality Date  . blood clots  2008   Family History  Problem Relation Age of Onset  . Cancer Mother        Breast  . Heart disease Mother   . Stroke Mother   . Hypertension Mother   . Breast cancer Mother        69's  . Heart disease Father   . Stroke Father   . Hypertension Father   . Diabetes Father   . Colon cancer Other        paternal cousin   Social History   Socioeconomic History  . Marital status: Widowed    Spouse name: Not on file  . Number of children: Not on file  . Years of education: Not on file  . Highest education level: Not on file  Occupational History  . Not on file  Social Needs  . Financial resource strain: Not on file  . Food insecurity:    Worry: Not on file    Inability: Not on file  . Transportation needs:    Medical: Not on file    Non-medical: Not on file  Tobacco Use  . Smoking status: Never Smoker  . Smokeless tobacco: Never Used  Substance and Sexual  Activity  . Alcohol use: No    Alcohol/week: 0.0 standard drinks  . Drug use: No  . Sexual activity: Not on file  Lifestyle  . Physical activity:    Days per week: Not on file    Minutes per session: Not on file  . Stress: Not on file  Relationships  . Social connections:    Talks on phone: Not on file    Gets together: Not on file    Attends religious service: Not on file    Active member of club or organization: Not on file    Attends meetings of clubs or organizations: Not on file    Relationship status: Not on file  Other Topics Concern  . Not on file  Social History Narrative  . Not on file    Outpatient Encounter Medications as of 02/06/2018  Medication Sig  . acetaminophen (TYLENOL) 650 MG CR tablet Take 650 mg by mouth every 8 (eight) hours as needed for pain.  . benzonatate (TESSALON) 200 MG capsule Take 1 capsule (200 mg total) by mouth 3 (three) times  daily.  . chlorpheniramine-HYDROcodone (TUSSIONEX) 10-8 MG/5ML SUER Take 5 mLs by mouth every 12 (twelve) hours as needed for cough.  . citalopram (CELEXA) 20 MG tablet Take 1 tablet (20 mg total) by mouth daily.  . [EXPIRED] doxycycline (VIBRA-TABS) 100 MG tablet Take 1 tablet (100 mg total) by mouth every 12 (twelve) hours for 4 days.  . fluticasone (FLOVENT HFA) 110 MCG/ACT inhaler Inhale 2 puffs into the lungs 2 (two) times daily. Rinse mouth after use (Patient not taking: Reported on 01/31/2018)  . losartan (COZAAR) 50 MG tablet TAKE 1 TABLET BY MOUTH ONCE A DAY  . predniSONE (DELTASONE) 10 MG tablet Prednisone 22m - take 6 tablets x 1 day and then decrease by 1/2 tablet per day until down to zero mg.  . rosuvastatin (CRESTOR) 5 MG tablet Take 1 tablet (5 mg total) by mouth daily.  .Marland Kitchenwarfarin (COUMADIN) 5 MG tablet TAKE 1 TABLET BY MOUTH ONCE A DAY (Patient taking differently: Take 2.5-5 mg by mouth daily. Take 2.5 mg Sunday- Friday, Takes 5 mg on Saturday)  . [DISCONTINUED] predniSONE (DELTASONE) 20 MG tablet Take 2  tablets (40 mg total) by mouth daily with breakfast for 4 days.  . [DISCONTINUED] predniSONE (DELTASONE) 20 MG tablet Take 6 tablets x 1 day and then decrease by 1/2 tablet per day until down to zero mg.   No facility-administered encounter medications on file as of 02/06/2018.     Review of Systems  Constitutional: Negative for appetite change and unexpected weight change.  HENT: Positive for congestion. Negative for sinus pressure.   Respiratory: Positive for cough and shortness of breath. Negative for chest tightness.   Cardiovascular: Negative for chest pain and leg swelling.  Gastrointestinal: Negative for abdominal pain, diarrhea, nausea and vomiting.  Genitourinary: Negative for difficulty urinating and dysuria.  Musculoskeletal: Negative for joint swelling and myalgias.  Skin: Negative for color change and rash.  Neurological: Negative for dizziness, light-headedness and headaches.  Psychiatric/Behavioral: Negative for agitation and dysphoric mood.       Objective:    Physical Exam Constitutional:      General: She is not in acute distress.    Appearance: Normal appearance.  HENT:     Nose: Nose normal. No congestion.     Mouth/Throat:     Pharynx: No oropharyngeal exudate or posterior oropharyngeal erythema.  Neck:     Musculoskeletal: Neck supple.     Thyroid: No thyromegaly.  Cardiovascular:     Rate and Rhythm: Normal rate and regular rhythm.  Pulmonary:     Comments: Increased air movement.  Still with increased cough with forced expiration.   Abdominal:     General: Bowel sounds are normal.     Palpations: Abdomen is soft.     Tenderness: There is no abdominal tenderness.  Musculoskeletal:        General: No swelling or tenderness.  Lymphadenopathy:     Cervical: No cervical adenopathy.  Skin:    Findings: No erythema or rash.  Neurological:     Mental Status: She is alert.  Psychiatric:        Mood and Affect: Mood normal.        Behavior: Behavior  normal.     BP 140/82 (BP Location: Left Arm, Patient Position: Sitting, Cuff Size: Large)   Pulse 78   Temp 97.8 F (36.6 C) (Oral)   Resp 18   Wt 230 lb 3.2 oz (104.4 kg)   LMP 04/29/1997   SpO2 92%  BMI 39.51 kg/m  Wt Readings from Last 3 Encounters:  02/06/18 230 lb 3.2 oz (104.4 kg)  01/31/18 231 lb 4.2 oz (104.9 kg)  01/31/18 232 lb 6.4 oz (105.4 kg)     Lab Results  Component Value Date   WBC 5.9 02/01/2018   HGB 13.5 02/01/2018   HCT 42.8 02/01/2018   PLT 236 02/01/2018   GLUCOSE 153 (H) 02/01/2018   CHOL 248 (H) 11/13/2017   TRIG 245.0 (H) 11/13/2017   HDL 55.50 11/13/2017   LDLDIRECT 174.0 11/13/2017   LDLCALC 72 04/10/2017   ALT 19 11/13/2017   AST 22 11/13/2017   NA 138 02/01/2018   K 4.4 02/06/2018   CL 108 02/01/2018   CREATININE 0.89 02/01/2018   BUN 14 02/01/2018   CO2 21 (L) 02/01/2018   TSH 4.42 04/10/2017   INR 1.3 (H) 02/06/2018   HGBA1C 6.0 11/13/2017    Dg Chest 2 View  Result Date: 01/31/2018 CLINICAL DATA:  Shortness of breath and wheezing for past 3 days. History of asthma EXAM: CHEST - 2 VIEW COMPARISON:  PA and lateral chest x-ray of November 13, 2017 FINDINGS: The lungs are well-expanded. There is no focal infiltrate. There is no pleural effusion. The heart and pulmonary vascularity are normal. There is calcification in wall of the aortic arch. The bony thorax exhibits no acute abnormality. IMPRESSION: There is no active cardiopulmonary disease. Mild chronic bronchitic-reactive airway changes. Thoracic aortic atherosclerosis. Electronically Signed   By: David  Martinique M.D.   On: 01/31/2018 14:38       Assessment & Plan:   Problem List Items Addressed This Visit    Acute bronchitis    Recently admitted with acute hypoxic respiratory failure in the setting of acute bronchitis.  Treated with doxycycline and prednisone.  Extend prednisone taper.  Complete abx.  Discussed probiotics.  Follow up with me next week.        Asthma     Recently admitted with hypoxic respiratory failure in the setting of acute bronchitis with asthma exacerbation.  Oxygen has been weaned off.  Continue abx.  Extend prednisone. Continue inhalers.  Get her back in soon to reassess.        Relevant Medications   predniSONE (DELTASONE) 10 MG tablet   Essential hypertension, benign    Blood pressure has been under reasonable control. Same medication. Follow pressures.        History of blood clots    Recheck pt/inr on coumadin and abx.        Hypercholesterolemia    Low cholesterol diet and exercise.  Follow lipid panel.       Hyperglycemia    Follow met b and a1c.        Respiratory failure with hypoxia ([place])    Recently admitted and treated as outlined.  Discharged home on prednisone and doxycycline.  Continue inhalers.  Extend prednisone.  Has improved.  Schedule f/u next week to assess for continued improvement.         Other Visit Diagnoses    Hypokalemia    -  Primary   Relevant Orders   Potassium (Completed)   Anticoagulated on Coumadin       Relevant Orders   Protime-INR (Completed)       Einar Pheasant, MD

## 2018-02-07 ENCOUNTER — Other Ambulatory Visit: Payer: Self-pay

## 2018-02-07 NOTE — Patient Outreach (Signed)
[facility]) Care Management  02/07/2018  Tina Duarte Apr 03, 1945 [id number]    EMMI-GENERAL DISCHARGE  RED ON EMMI ALERT Day # 1 Date: 02/06/18 10:21 AM Red Alert Reason: "Read discharge papers? No", Unfilled prescriptions? Yes"   Outreach attempt # 1 successful.    Spoke with patient, HIPAA verified.   Reviewed and addressed red alerts. Patient states she has filled all prescriptions and is taking them as prescribed. She voices having financial difficulty paying for her inhalers but declines having a [facility] referral at this time. Patient states her doctor is working on getting her some sample inhalers. She confirms having read over her discharge papers and denies having questions or concerns at this time. Patient f/u with her PCP earlier in the week to report ongoing wheezing and shortness of breath. She states the wheezing has improved but she continues to have shortness of breath with activity.   Patient lives with her son who provides transportation for her MD appointments. 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 RN CM provided education on using pursed lip breathing exercises to help remove trapped air and control breathing. RN CM encouraged patient to breath in slowly through her nose when moving around to help control her breathing pattern. RN CM instructed patient to use her inhalers exactly as prescribed without missing doses for best results/effectiveness. RN CM instructed patient to rinse her mouth immediately after using the Flovent inhaler to help prevent thrush mouth. RN CM instructed patient to keep her PCP well informed about any new or worsening symptoms related to her breathing. RN CM provided patient with the [facility] 24/7 nurse advice line phone #. Patient verbalizes understanding and is appreciative of the information. She reports her next MD f/u is scheduled for Monday, 02/11/18 with her PCP. She denies having any  further questions or concerns at this time.  Advised patient she may get one more automated EMMI-GENERAL post discharge call and may receive a call from a nurse if any of her responses are abnormal. Patient voiced understanding and was appreciative of f/u call.   Plan: RN CM will follow up with patient in 1-2 weeks to further assess for CM needs and patients progress towards wellness.     Barb Merino, RN,CCM [facility] Care Management Care Management Coordinator Direct Phone: [phone] Toll Free: [phone] Fax: [phone]

## 2018-02-08 DIAGNOSIS — J45901 Unspecified asthma with (acute) exacerbation: Secondary | ICD-10-CM | POA: Diagnosis not present

## 2018-02-08 DIAGNOSIS — E7800 Pure hypercholesterolemia, unspecified: Secondary | ICD-10-CM | POA: Diagnosis not present

## 2018-02-08 DIAGNOSIS — I10 Essential (primary) hypertension: Secondary | ICD-10-CM | POA: Diagnosis not present

## 2018-02-10 ENCOUNTER — Encounter: Payer: Self-pay | Admitting: Internal Medicine

## 2018-02-10 DIAGNOSIS — J9691 Respiratory failure, unspecified with hypoxia: Secondary | ICD-10-CM | POA: Insufficient documentation

## 2018-02-10 NOTE — Assessment & Plan Note (Signed)
Low cholesterol diet and exercise.  Follow lipid panel.   

## 2018-02-10 NOTE — Assessment & Plan Note (Signed)
Recheck pt/inr on coumadin and abx.

## 2018-02-10 NOTE — Assessment & Plan Note (Signed)
Follow met b and a1c.

## 2018-02-10 NOTE — Assessment & Plan Note (Signed)
Recently admitted and treated as outlined.  Discharged home on prednisone and doxycycline.  Continue inhalers.  Extend prednisone.  Has improved.  Schedule f/u next week to assess for continued improvement.

## 2018-02-10 NOTE — Assessment & Plan Note (Signed)
Blood pressure has been under reasonable control. Same medication. Follow pressures.

## 2018-02-10 NOTE — Assessment & Plan Note (Signed)
Recently admitted with hypoxic respiratory failure in the setting of acute bronchitis with asthma exacerbation.  Oxygen has been weaned off.  Continue abx.  Extend prednisone. Continue inhalers.  Get her back in soon to reassess.

## 2018-02-10 NOTE — Assessment & Plan Note (Signed)
Recently admitted with acute hypoxic respiratory failure in the setting of acute bronchitis.  Treated with doxycycline and prednisone.  Extend prednisone taper.  Complete abx.  Discussed probiotics.  Follow up with me next week.

## 2018-02-11 ENCOUNTER — Ambulatory Visit: Payer: Medicare Other | Admitting: Internal Medicine

## 2018-02-11 DIAGNOSIS — J45901 Unspecified asthma with (acute) exacerbation: Secondary | ICD-10-CM | POA: Diagnosis not present

## 2018-02-11 DIAGNOSIS — I10 Essential (primary) hypertension: Secondary | ICD-10-CM | POA: Diagnosis not present

## 2018-02-11 DIAGNOSIS — Z0289 Encounter for other administrative examinations: Secondary | ICD-10-CM

## 2018-02-11 DIAGNOSIS — E7800 Pure hypercholesterolemia, unspecified: Secondary | ICD-10-CM | POA: Diagnosis not present

## 2018-02-12 ENCOUNTER — Telehealth: Payer: Self-pay | Admitting: Internal Medicine

## 2018-02-12 NOTE — Telephone Encounter (Signed)
Copied from [place] [id number]. Topic: Quick Communication - Home Health Verbal Orders >> Feb 12, 2018  9:27 AM Jodie Echevaria wrote: Caller/Agency: [facility], [place] Number: [phone] ok to LM Requesting OT/PT/Skilled Nursing/Social Work: PT  Frequency: twice week for two weeks

## 2018-02-13 DIAGNOSIS — J45901 Unspecified asthma with (acute) exacerbation: Secondary | ICD-10-CM | POA: Diagnosis not present

## 2018-02-13 DIAGNOSIS — I10 Essential (primary) hypertension: Secondary | ICD-10-CM | POA: Diagnosis not present

## 2018-02-13 DIAGNOSIS — E7800 Pure hypercholesterolemia, unspecified: Secondary | ICD-10-CM | POA: Diagnosis not present

## 2018-02-14 ENCOUNTER — Other Ambulatory Visit: Payer: Medicare Other

## 2018-02-14 NOTE — Telephone Encounter (Signed)
LMTCB if needed, but stated that I was calling to give verbal orders.

## 2018-02-14 NOTE — Telephone Encounter (Signed)
Tina Duarte from [facility] asked for verbal orders but unfortunately the request was sent to the wrong practice by mistake.  Tina Duarte would like it if someone could call her back today with the order since it's been three days now.

## 2018-02-15 DIAGNOSIS — J45901 Unspecified asthma with (acute) exacerbation: Secondary | ICD-10-CM | POA: Diagnosis not present

## 2018-02-15 DIAGNOSIS — E7800 Pure hypercholesterolemia, unspecified: Secondary | ICD-10-CM | POA: Diagnosis not present

## 2018-02-15 DIAGNOSIS — I10 Essential (primary) hypertension: Secondary | ICD-10-CM | POA: Diagnosis not present

## 2018-02-19 DIAGNOSIS — E7800 Pure hypercholesterolemia, unspecified: Secondary | ICD-10-CM | POA: Diagnosis not present

## 2018-02-19 DIAGNOSIS — I10 Essential (primary) hypertension: Secondary | ICD-10-CM | POA: Diagnosis not present

## 2018-02-19 DIAGNOSIS — J45901 Unspecified asthma with (acute) exacerbation: Secondary | ICD-10-CM | POA: Diagnosis not present

## 2018-02-20 ENCOUNTER — Other Ambulatory Visit: Payer: Self-pay

## 2018-02-20 DIAGNOSIS — I10 Essential (primary) hypertension: Secondary | ICD-10-CM | POA: Diagnosis not present

## 2018-02-20 DIAGNOSIS — E7800 Pure hypercholesterolemia, unspecified: Secondary | ICD-10-CM | POA: Diagnosis not present

## 2018-02-20 DIAGNOSIS — J45901 Unspecified asthma with (acute) exacerbation: Secondary | ICD-10-CM | POA: Diagnosis not present

## 2018-02-20 NOTE — Telephone Encounter (Signed)
This encounter was created in error - please disregard.

## 2018-02-20 NOTE — Patient Outreach (Signed)
[facility]) Care Management  02/20/2018  Tina Duarte 03-27-1945 [id number]    EMMI-GENERAL DISCHARGE FOLLOW-UP CALL   RED ON EMMI ALERT Day # 1 Date: 02/06/18 10:21 AM Red Alert Reason: "Read discharge papers? No", Unfilled prescriptions? Yes"   Outreach FOLLOW-UP attempt # 1 successful.    Spoke with patient, HIPAA verified.    FOLLOW-UP: Patient states she is progressing towards wellness. She did not f/u with her PCP and reports her appointment was missed by mistake. She does not have an appointment with her PCP until July. Patient states she will call the MDO if needed for new or worsening s/s. Currently she continues to receive in home PT/SNV from [place]. She has a CNA assisting with bathing. 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 Her son and daughter continue to be available for her as needed and her son continues to live with her. Patient reports adherence with taking her prescribed medications including her inhalers, as directed. She is utilizing deep breathing exercises when needed to "slow my breathing down".   Patient states her chief complaint is related to difficulty with breathing during activity such as showering/bathing. She reports having to take multiple breaks when trying to perform this activity and states "it just wears me out". She states she is not taking showers as often as before due to the effort it takes to do so. Patient is agreeable to asking the home health nurse during the next scheduled visit set for Friday this week, to assess her oxygen level with activity. She denies having any other questions or concerns at this time. RN CM informed patient of last CM call due to patient is progressing towards wellness and no CM needs are identified. Patient verbalizes understanding and states appreciation for call.   Plan:  RN CM will close case due to patient is stable and no further CM needs are  identified at this time.   Barb Merino, RN,CCM [facility] Care Management Care Management Coordinator Direct Phone: [phone] Toll Free: [phone] Fax: [phone]

## 2018-02-20 NOTE — Patient Outreach (Signed)
[facility] Care Management  02/20/2018  Jaylene Arrowood Pepin 07/04/1945 [id number]    EMMI-GENERAL [place] [facility]  Outbound call to nurse Abby with [place] [phone], introduced myself and gave the purpose of my call. Advised this patient reported she is having increased fatigue and exertion when performing activities such as bathing/showering. Abby states she will perform a walk test during her visit on Friday, 02/22/18. She will call Dr. Nicki Reaper to report any abnormal values or concerns and thanked me for the call.   Barb Merino, RN,CCM [facility] Care Management Care Management Coordinator Direct Phone: [phone] Toll Free: [phone] Fax: [phone]

## 2018-02-21 ENCOUNTER — Telehealth: Payer: Self-pay | Admitting: Internal Medicine

## 2018-02-21 DIAGNOSIS — R0602 Shortness of breath: Secondary | ICD-10-CM

## 2018-02-21 DIAGNOSIS — F329 Major depressive disorder, single episode, unspecified: Secondary | ICD-10-CM | POA: Diagnosis not present

## 2018-02-21 DIAGNOSIS — J45901 Unspecified asthma with (acute) exacerbation: Secondary | ICD-10-CM | POA: Diagnosis not present

## 2018-02-21 DIAGNOSIS — I10 Essential (primary) hypertension: Secondary | ICD-10-CM | POA: Diagnosis not present

## 2018-02-21 DIAGNOSIS — E7800 Pure hypercholesterolemia, unspecified: Secondary | ICD-10-CM | POA: Diagnosis not present

## 2018-02-21 NOTE — Telephone Encounter (Signed)
Confirmed with pt that SOB is the same as last time in office. Patient confirmed nothing acute going on. I left message for home health to call me back. Patient is wanting glucometer to keep a check on sugars because she says it was recommended by home health. Also recommending pulmonary referral

## 2018-02-21 NOTE — Telephone Encounter (Signed)
Copied from [place]. Topic: Quick Communication - See Telephone Encounter >> Feb 21, 2018 12:38 PM Ivar Drape wrote: CRM for notification. See Telephone encounter for: 02/21/18. Abby nurse w/[facility] [phone] wanted an order for a Glucometer w/strips.    She also wanted the provider to know the patient has been short of breath upon exertion.  She believes the patient needs to be referred to a Pulmonary Specialist.

## 2018-02-21 NOTE — Telephone Encounter (Signed)
[place] with glucometer.  Is she agreeable to pulmonary referral.  Just let me know.  She missed her f/u appt with me.  If persistent sob, needs to be evaluated.

## 2018-02-22 NOTE — Telephone Encounter (Signed)
Pt agreeable to pulmonary appt. We discussed this pt yesterday

## 2018-02-22 NOTE — Telephone Encounter (Signed)
I have placed the order for the pulmonary referral.  Per our discussion, you had stated pt was feeling better.  If having persistent problems, may need to be evaluated prior to her pulmonary appt.  Can schedule appt if needs to be seen.

## 2018-02-27 DIAGNOSIS — J45901 Unspecified asthma with (acute) exacerbation: Secondary | ICD-10-CM | POA: Diagnosis not present

## 2018-02-27 DIAGNOSIS — E7800 Pure hypercholesterolemia, unspecified: Secondary | ICD-10-CM | POA: Diagnosis not present

## 2018-02-27 DIAGNOSIS — I10 Essential (primary) hypertension: Secondary | ICD-10-CM | POA: Diagnosis not present

## 2018-02-28 ENCOUNTER — Telehealth: Payer: Self-pay

## 2018-02-28 NOTE — Telephone Encounter (Signed)
Left message for [place], [facility] nurse, to give me a call back. Pt is overdue for INR check and was going to see if they could check it and send results.

## 2018-02-28 NOTE — Telephone Encounter (Signed)
Left message for patient to call back and schedule INR

## 2018-02-28 NOTE — Telephone Encounter (Signed)
Caller name: Abby  Relation to pt: HHN from [place]  Call back number: [phone]    Reason for call:   As per Abby they don't check INR due to not having the equipment and patient discharge 02/27/2018 therefore no longer a patient. Patient scheduled for follow up with pulmonary.

## 2018-03-01 NOTE — Telephone Encounter (Signed)
Put note on NV schedule for pt to have INR drawn while she is here for her NV

## 2018-03-05 ENCOUNTER — Ambulatory Visit: Payer: Medicare Other

## 2018-03-20 ENCOUNTER — Ambulatory Visit (INDEPENDENT_AMBULATORY_CARE_PROVIDER_SITE_OTHER): Payer: PRIVATE HEALTH INSURANCE | Admitting: Internal Medicine

## 2018-03-20 ENCOUNTER — Encounter: Payer: Self-pay | Admitting: Internal Medicine

## 2018-03-20 VITALS — BP 132/84 | HR 103 | Ht 64.0 in | Wt 231.0 lb

## 2018-03-20 DIAGNOSIS — J441 Chronic obstructive pulmonary disease with (acute) exacerbation: Secondary | ICD-10-CM

## 2018-03-20 DIAGNOSIS — F419 Anxiety disorder, unspecified: Secondary | ICD-10-CM | POA: Diagnosis not present

## 2018-03-20 DIAGNOSIS — R05 Cough: Secondary | ICD-10-CM

## 2018-03-20 DIAGNOSIS — R059 Cough, unspecified: Secondary | ICD-10-CM

## 2018-03-20 MED ORDER — METHYLPREDNISOLONE ACETATE 80 MG/ML IJ SUSP
80.0000 mg | Freq: Once | INTRAMUSCULAR | Status: AC
  Administered 2018-03-20: 80 mg via INTRAMUSCULAR

## 2018-03-20 MED ORDER — BUDESONIDE 0.5 MG/2ML IN SUSP: 0.5000 mg | Freq: Two times a day (BID) | RESPIRATORY_TRACT | 2 refills | Status: DC

## 2018-03-20 MED ORDER — ALBUTEROL SULFATE 0.63 MG/3ML IN NEBU: 1.0000 | INHALATION_SOLUTION | Freq: Four times a day (QID) | RESPIRATORY_TRACT | 12 refills | Status: DC | PRN

## 2018-03-20 MED ORDER — IPRATROPIUM-ALBUTEROL 0.5-2.5 (3) MG/3ML IN SOLN
3.0000 mL | Freq: Once | RESPIRATORY_TRACT | Status: AC
  Administered 2018-03-20: 3 mL via RESPIRATORY_TRACT

## 2018-03-20 MED ORDER — ALPRAZOLAM 0.25 MG PO TABS: 0.2500 mg | ORAL_TABLET | Freq: Every evening | ORAL | 0 refills | Status: DC | PRN

## 2018-03-20 MED ORDER — PREDNISONE 20 MG PO TABS: 40.0000 mg | ORAL_TABLET | Freq: Every day | ORAL | 0 refills | Status: DC

## 2018-03-20 NOTE — Progress Notes (Addendum)
Name: Tina Duarte MRN: [id number] DOB: [birth year]     CONSULTATION DATE: 2.19.2020 REFERRING MD : Nicki Reaper  CHIEF COMPLAINT: SOB  STUDIES:    01/31/2018 CXR independently reviewed by Me No acute pneumonia No edema No effusion NL looking radiograph      HISTORY OF PRESENT ILLNESS: 73 yo white female seen today for SOB Patient admitted last month for acute bronchitis and wheezing Was treated with ABX and prednisone and felt better  In Sept 2019 patient started to have progressive SOB and WOB with wheezing She was subsequently admitted for COPD exacerbation  She has extensive second hand smoke exposure from family And resetaurant She also exposed to cleaning supplies all her life  She has had h/o sinus infections  H/o blood clots in 2010 on oral AC  At this time, she has b/l wheezing with cough And feels bad  She needs NEB therapy and DEPOMEDROL Shot in office NOW   +COPD exacerbation at this time     PAST MEDICAL HISTORY :   has a past medical history of Allergy, Asthma, Chicken pox, History of blood clots, Hypercholesterolemia, and Hypertension.  has a past surgical history that includes blood clots (2008). Prior to Admission medications   Medication Sig Start Date End Date Taking? Authorizing Provider  acetaminophen (TYLENOL) 650 MG CR tablet Take 650 mg by mouth every 8 (eight) hours as needed for pain.    [provider]  benzonatate (TESSALON) 200 MG capsule Take 1 capsule (200 mg total) by mouth 3 (three) times daily. 02/04/18   Bettey Costa, MD  chlorpheniramine-HYDROcodone (TUSSIONEX) 10-8 MG/5ML SUER Take 5 mLs by mouth every 12 (twelve) hours as needed for cough. 02/04/18   Bettey Costa, MD  citalopram (CELEXA) 20 MG tablet Take 1 tablet (20 mg total) by mouth daily. 01/16/18   Einar Pheasant, MD  fluticasone (FLOVENT HFA) 110 MCG/ACT inhaler Inhale 2 puffs into the lungs 2 (two) times daily. Rinse mouth after use Patient not taking: Reported on  01/31/2018 01/31/18   Einar Pheasant, MD  losartan (COZAAR) 50 MG tablet TAKE 1 TABLET BY MOUTH ONCE A DAY 01/16/18   Einar Pheasant, MD  predniSONE (DELTASONE) 10 MG tablet Prednisone 10mg  - take 6 tablets x 1 day and then decrease by 1/2 tablet per day until down to zero mg. 02/06/18   Einar Pheasant, MD  rosuvastatin (CRESTOR) 5 MG tablet Take 1 tablet (5 mg total) by mouth daily. 01/31/18   Einar Pheasant, MD  warfarin (COUMADIN) 5 MG tablet TAKE 1 TABLET BY MOUTH ONCE A DAY Patient taking differently: Take 2.5-5 mg by mouth daily. Take 2.5 mg Sunday- Friday, Takes 5 mg on Saturday 01/16/18   Einar Pheasant, MD   No Known Allergies  FAMILY HISTORY:  family history includes Breast cancer in her mother; Cancer in her mother; Colon cancer in an other family member; Diabetes in her father; Heart disease in her father and mother; Hypertension in her father and mother; Stroke in her father and mother. SOCIAL HISTORY:  reports that she has never smoked. She has never used smokeless tobacco. She reports that she does not drink alcohol or use drugs.  REVIEW OF SYSTEMS:   Constitutional: Negative for fever, chills, weight loss, malaise/fatigue and diaphoresis.  HENT: Negative for hearing loss, ear pain, nosebleeds, congestion, sore throat, neck pain, tinnitus and ear discharge.   Eyes: Negative for blurred vision, double vision, photophobia, pain, discharge and redness.  Respiratory: +cough, -hemoptysis, -sputum production, +shortness of breath, +  wheezing and stridor.   Cardiovascular: Negative for chest pain, palpitations, orthopnea, claudication, leg swelling and PND.  Gastrointestinal: Negative for heartburn, nausea, vomiting, abdominal pain, diarrhea, constipation, blood in stool and melena.  Genitourinary: Negative for dysuria, urgency, frequency, hematuria and flank pain.  Musculoskeletal: Negative for myalgias, back pain, joint pain and falls.  Skin: Negative for itching and rash.    Neurological: Negative for dizziness, tingling, tremors, sensory change, speech change, focal weakness, seizures, loss of consciousness, weakness and headaches.  Endo/Heme/Allergies: Negative for environmental allergies and polydipsia. Does not bruise/bleed easily.  ALL OTHER ROS ARE NEGATIVE   Ht 5\' 4"  (1.626 m)   LMP 04/29/1997   BMI 39.51 kg/m    BP 132/84 (BP Location: Left Arm, Cuff Size: Normal)   Pulse (!) 103   Ht 5\' 4"  (1.626 m)   Wt 231 lb (104.8 kg)   LMP 04/29/1997   SpO2 94%   BMI 39.65 kg/m    Physical Examination:   GENERAL:NAD, no fevers, chills, no weakness no fatigue HEAD: Normocephalic, atraumatic.  EYES: Pupils equal, round, reactive to light. Extraocular muscles intact. No scleral icterus.  MOUTH: Moist mucosal membrane.   EAR, NOSE, THROAT: Clear without exudates. No external lesions.  NECK: Supple. No thyromegaly. No nodules. No JVD.  PULMONARY:CTA B/L + wheezes, no crackles, no rhonchi CARDIOVASCULAR: S1 and S2. Regular rate and rhythm. No murmurs, rubs, or gallops. No edema.  GASTROINTESTINAL: Soft, nontender, nondistended. No masses. Positive bowel sounds.  MUSCULOSKELETAL: No swelling, clubbing, or edema. Range of motion full in all extremities.  NEUROLOGIC: Cranial nerves II through XII are intact. No gross focal neurological deficits.  SKIN: No ulceration, lesions, rashes, or cyanosis. Skin warm and dry. Turgor intact.  PSYCHIATRIC: Mood, affect within normal limits. The patient is awake, alert and oriented x 3. Insight, judgment intact.      ASSESSMENT / PLAN:  73 yo morbidly obese white female with long standing second hand smoke exposure with signs and symptoms of having underlying COPD with acute COPD exacerbation at this time with ongoing wheezing and cough  COPD EXACERBATION -IM steroids as prescribed Prednisone 40 mg daily for 10 days NEB therapy in office now -start ALB NEB THERAPY every 6 hrs scheduled and then every 6 hrs as  needed -start PULMICOART NEBS BID   Wheezing and COUGH-from COPD Will need NEB therapy for resp insufficiency at this time   H/o Blood CLots Continue AC as prescribed  Anxiety Xanax as needed  Patient will need close follow up in 2 weeks   Patient/Family are satisfied with Plan of action and management. All questions answered  Corrin Parker, M.D.  [facility]  Medical Director [place] Director [facility] Cardio-Pulmonary Department

## 2018-03-20 NOTE — Patient Instructions (Addendum)
START PREDNISONE  40 mg daily for 10 days   ALBUTEROL NEB  EVERY 6 hrs for next 3 days and then every 6 hrs as needed   PULMICORT NEBS BID

## 2018-03-20 NOTE — Addendum Note (Signed)
Addended by: Flora Lipps on: 03/20/2018 02:30 PM   Modules accepted: Orders

## 2018-04-03 ENCOUNTER — Ambulatory Visit (INDEPENDENT_AMBULATORY_CARE_PROVIDER_SITE_OTHER): Payer: Medicare Other | Admitting: Internal Medicine

## 2018-04-03 ENCOUNTER — Encounter: Payer: Self-pay | Admitting: Internal Medicine

## 2018-04-03 VITALS — BP 134/80 | HR 72 | Ht 64.0 in | Wt 233.4 lb

## 2018-04-03 DIAGNOSIS — J449 Chronic obstructive pulmonary disease, unspecified: Secondary | ICD-10-CM | POA: Diagnosis not present

## 2018-04-03 NOTE — Progress Notes (Signed)
   Name: Tina Duarte MRN: [id number] DOB: 1946/01/27     CONSULTATION DATE: 2.19.2020 REFERRING MD : Nicki Reaper  CHIEF COMPLAINT: SOB  STUDIES:    01/31/2018 CXR independently reviewed by Me No acute pneumonia No edema No effusion NL looking radiograph      HISTORY OF PRESENT ILLNESS: Patient with complete resolution of COPD exacerbation Back to baseline SOB and DOE No signs if infection No signs of COPD exacerbation  No wheezing No cough  +CHronic SOB and DOE related to COPD     Review of Systems:  Gen:  Denies  fever, sweats, chills weigh loss  HEENT: Denies blurred vision, double vision, ear pain, eye pain, hearing loss, nose bleeds, sore throat Cardiac:  No dizziness, chest pain or heaviness, chest tightness,edema, No JVD Resp:   No cough, -sputum production, -shortness of breath,-wheezing, -hemoptysis,  Gi: Denies swallowing difficulty, stomach pain, nausea or vomiting, diarrhea, constipation, bowel incontinence Gu:  Denies bladder incontinence, burning urine Ext:   Denies Joint pain, stiffness or swelling Skin: Denies  skin rash, easy bruising or bleeding or hives Endoc:  Denies polyuria, polydipsia , polyphagia or weight change Psych:   Denies depression, insomnia or hallucinations  Other:  All other systems negative    BP 134/80 (BP Location: Left Arm, Cuff Size: Normal)   Pulse 72   Ht 5\' 4"  (2.951 m)   Wt 233 lb 6.4 oz (105.9 kg)   LMP 04/29/1997   SpO2 96%   BMI 40.06 kg/m     Physical Examination:   GENERAL:NAD, no fevers, chills, no weakness no fatigue HEAD: Normocephalic, atraumatic.  EYES: PERLA, EOMI No scleral icterus.  MOUTH: Moist mucosal membrane.  EAR, NOSE, THROAT: Clear without exudates. No external lesions.  NECK: Supple. No thyromegaly.  No JVD.  PULMONARY: CTA B/L no wheezing, rhonchi, crackles CARDIOVASCULAR: S1 and S2. Regular rate and rhythm. No murmurs GASTROINTESTINAL: Soft, nontender, nondistended. Positive bowel sounds.    MUSCULOSKELETAL: No swelling, clubbing, or edema.  NEUROLOGIC: No gross focal neurological deficits. 5/5 strength all extremities SKIN: No ulceration, lesions, rashes, or cyanosis.  PSYCHIATRIC: Insight, judgment intact. -depression -anxiety ALL OTHER ROS ARE NEGATIVE         ASSESSMENT / PLAN:   73 yo morbidly obese WF with long standing smoking exposure with recent COPD exacerbation   COPD exacerbation resolved No need for more ABX or steroids at this time  COPD-she will need PFT's at somepoint Pulmioctr NEBS BID ALbuterol NEBS every 4 hrs as needed  SOB Check ONO for nocturnal hypoxia   H/o Blood CLots Continue AC as prescribed  Anxiety Xanax as needed   Obesity -recommend significant weight loss -recommend changing diet  Deconditioned state -Recommend increased daily activity and exercise    Patient/Family are satisfied with Plan of action and management. All questions answered Follow up in 3 months   Filmore Molyneux [name], M.D.  [facility]  Medical Director [place] Director [facility] Cardio-Pulmonary Department

## 2018-04-03 NOTE — Patient Instructions (Addendum)
CONTINUE PULMICORT NEBS TWICE DAILY  ALBUTEROL NEBS EVERY 4 HRS As NEEDED  CONTINUE COUMADIN  Check OVERNIGHT PULSE OXIMETRY

## 2018-04-10 ENCOUNTER — Telehealth: Payer: Self-pay

## 2018-04-10 DIAGNOSIS — G4734 Idiopathic sleep related nonobstructive alveolar hypoventilation: Secondary | ICD-10-CM

## 2018-04-10 DIAGNOSIS — J449 Chronic obstructive pulmonary disease, unspecified: Secondary | ICD-10-CM | POA: Diagnosis not present

## 2018-04-10 NOTE — Telephone Encounter (Signed)
I spoke to patient regarding her ONO results. Recommend 1LPM at night. Orders entered. Patient aware.

## 2018-05-11 DIAGNOSIS — J449 Chronic obstructive pulmonary disease, unspecified: Secondary | ICD-10-CM | POA: Diagnosis not present

## 2018-06-04 ENCOUNTER — Telehealth: Payer: Self-pay | Admitting: Internal Medicine

## 2018-06-04 NOTE — Telephone Encounter (Signed)
Copied from [place] [id number]. Topic: Quick Communication - Rx Refill/Question >> Jun 04, 2018  5:00 PM Ahmed Prima L wrote: Medication: warfarin (COUMADIN) 5 MG tablet  Has the patient contacted their pharmacy? Yes last week not getting reply  (Agent: If no, request that the patient contact the pharmacy for the refill.) (Agent: If yes, when and what did the pharmacy advise?)  Preferred Pharmacy (with phone number or street name): [id number] [address], [place] [address] [place] [address] Phone: [phone] Fax: [phone]    Agent: Please be advised that RX refills may take up to 3 business days. We ask that you follow-up with your pharmacy.

## 2018-06-05 ENCOUNTER — Other Ambulatory Visit: Payer: Self-pay

## 2018-06-05 NOTE — Progress Notes (Unsigned)
This pt is requesting coumadin, last lab was done 02/06/2018 and I wasn't sure Dtr. Scott how to handle this refill because it was written in 12/2017 and her Last OV was 02/06/2018.  Nina,cma

## 2018-06-05 NOTE — Progress Notes (Signed)
Needs lab appt.  Can schedule her to come in for lab and then refill x 1 month

## 2018-06-05 NOTE — Progress Notes (Signed)
I scheduled her for a lab appt tomorrow. The pharmacy gave her a few pills to last until Sunday. Do you want to wait until after lab appt to fill?

## 2018-06-05 NOTE — Progress Notes (Signed)
If she has enough, just make sure we get results.  We will wait - in case we need to change the dose.  Thanks.

## 2018-06-05 NOTE — Progress Notes (Signed)
noted 

## 2018-06-06 ENCOUNTER — Other Ambulatory Visit: Payer: Self-pay

## 2018-06-06 ENCOUNTER — Other Ambulatory Visit (INDEPENDENT_AMBULATORY_CARE_PROVIDER_SITE_OTHER): Payer: Medicare Other

## 2018-06-06 DIAGNOSIS — Z7901 Long term (current) use of anticoagulants: Secondary | ICD-10-CM | POA: Diagnosis not present

## 2018-06-06 LAB — PROTIME-INR
INR: 1.4 ratio — ABNORMAL HIGH (ref 0.8–1.0)
Prothrombin Time: 15.8 s — ABNORMAL HIGH (ref 9.6–13.1)

## 2018-06-07 ENCOUNTER — Other Ambulatory Visit: Payer: Self-pay

## 2018-06-07 MED ORDER — WARFARIN SODIUM 5 MG PO TABS: ORAL_TABLET | ORAL | 1 refills | Status: DC

## 2018-06-10 DIAGNOSIS — J449 Chronic obstructive pulmonary disease, unspecified: Secondary | ICD-10-CM | POA: Diagnosis not present

## 2018-06-17 ENCOUNTER — Other Ambulatory Visit (INDEPENDENT_AMBULATORY_CARE_PROVIDER_SITE_OTHER): Payer: Medicare Other

## 2018-06-17 ENCOUNTER — Other Ambulatory Visit: Payer: Self-pay

## 2018-06-17 DIAGNOSIS — Z86718 Personal history of other venous thrombosis and embolism: Secondary | ICD-10-CM

## 2018-06-17 DIAGNOSIS — Z7901 Long term (current) use of anticoagulants: Secondary | ICD-10-CM | POA: Diagnosis not present

## 2018-06-17 LAB — PROTIME-INR
INR: 3.5 ratio — ABNORMAL HIGH (ref 0.8–1.0)
Prothrombin Time: 39.8 s — ABNORMAL HIGH (ref 9.6–13.1)

## 2018-06-17 NOTE — Addendum Note (Signed)
Addended by: Leeanne Rio on: 06/17/2018 10:52 AM   Modules accepted: Orders

## 2018-06-19 ENCOUNTER — Telehealth: Payer: Self-pay | Admitting: *Deleted

## 2018-06-19 DIAGNOSIS — I10 Essential (primary) hypertension: Secondary | ICD-10-CM

## 2018-06-19 DIAGNOSIS — E7800 Pure hypercholesterolemia, unspecified: Secondary | ICD-10-CM

## 2018-06-19 DIAGNOSIS — R739 Hyperglycemia, unspecified: Secondary | ICD-10-CM

## 2018-06-19 DIAGNOSIS — Z7901 Long term (current) use of anticoagulants: Secondary | ICD-10-CM

## 2018-06-19 NOTE — Telephone Encounter (Signed)
Please place future orders for lab appt on 5/21 (standing order would be better)

## 2018-06-19 NOTE — Telephone Encounter (Signed)
Orders placed for labs.  I am also doing a met b, a1c and liver panel along with pt/inr.

## 2018-06-20 ENCOUNTER — Other Ambulatory Visit: Payer: Self-pay

## 2018-06-20 ENCOUNTER — Other Ambulatory Visit (INDEPENDENT_AMBULATORY_CARE_PROVIDER_SITE_OTHER): Payer: Medicare Other

## 2018-06-20 DIAGNOSIS — Z7901 Long term (current) use of anticoagulants: Secondary | ICD-10-CM | POA: Diagnosis not present

## 2018-06-20 DIAGNOSIS — M170 Bilateral primary osteoarthritis of knee: Secondary | ICD-10-CM | POA: Diagnosis not present

## 2018-06-20 LAB — PROTIME-INR
INR: 2.9 ratio — ABNORMAL HIGH (ref 0.8–1.0)
Prothrombin Time: 32.9 s — ABNORMAL HIGH (ref 9.6–13.1)

## 2018-06-20 NOTE — Telephone Encounter (Signed)
That's ok.  I would like to get it the next time.  Can we do the labs with her next pt/inr? Can you make note of this?  Thank you.

## 2018-06-20 NOTE — Telephone Encounter (Signed)
I verified with patient that we were only draw a PT/INR & I reviewed her last lab note which only mentioned to recheck INR the end of this week. So, I do apologize for not seeing this message in a timely manner.

## 2018-06-20 NOTE — Telephone Encounter (Signed)
I'm sorry, I did not see this message until patient left the office. I only drew her PT/INR (since that's what was mentioned on the appt note).

## 2018-06-20 NOTE — Telephone Encounter (Signed)
Yes, I have created a sticky note

## 2018-06-28 ENCOUNTER — Telehealth: Payer: Self-pay | Admitting: Internal Medicine

## 2018-06-28 NOTE — Telephone Encounter (Signed)
Called patient for COVID-19 pre-screening for in office visit.  Have you recently traveled any where out of the local area in the last 2 weeks? no  Have you been in close contact with a person diagnosed with COVID-19 within the last 2 weeks? no  Do you currently have any of the following symptoms? If so, when did they start? Cough     Diarrhea   Joint Pain Fever      Muscle Pain   Red eyes Shortness of breat-baseline  Abdominal pain  Vomiting Loss of smell    Rash    Sore Throat Headache    Weakness   Bruising or bleeding   Okay to proceed with visit.

## 2018-07-01 ENCOUNTER — Other Ambulatory Visit: Payer: Self-pay

## 2018-07-01 ENCOUNTER — Other Ambulatory Visit (INDEPENDENT_AMBULATORY_CARE_PROVIDER_SITE_OTHER): Payer: Medicare Other

## 2018-07-01 ENCOUNTER — Telehealth: Payer: Self-pay | Admitting: Internal Medicine

## 2018-07-01 ENCOUNTER — Ambulatory Visit (INDEPENDENT_AMBULATORY_CARE_PROVIDER_SITE_OTHER): Payer: Medicare Other | Admitting: Internal Medicine

## 2018-07-01 ENCOUNTER — Encounter: Payer: Self-pay | Admitting: Internal Medicine

## 2018-07-01 VITALS — BP 138/80 | HR 77 | Ht 64.0 in | Wt 230.0 lb

## 2018-07-01 DIAGNOSIS — E7800 Pure hypercholesterolemia, unspecified: Secondary | ICD-10-CM

## 2018-07-01 DIAGNOSIS — F5101 Primary insomnia: Secondary | ICD-10-CM | POA: Diagnosis not present

## 2018-07-01 DIAGNOSIS — I10 Essential (primary) hypertension: Secondary | ICD-10-CM | POA: Diagnosis not present

## 2018-07-01 DIAGNOSIS — J449 Chronic obstructive pulmonary disease, unspecified: Secondary | ICD-10-CM | POA: Diagnosis not present

## 2018-07-01 DIAGNOSIS — J9611 Chronic respiratory failure with hypoxia: Secondary | ICD-10-CM | POA: Diagnosis not present

## 2018-07-01 DIAGNOSIS — R739 Hyperglycemia, unspecified: Secondary | ICD-10-CM

## 2018-07-01 DIAGNOSIS — Z7901 Long term (current) use of anticoagulants: Secondary | ICD-10-CM

## 2018-07-01 LAB — HEPATIC FUNCTION PANEL
ALT: 11 U/L (ref 0–35)
AST: 11 U/L (ref 0–37)
Albumin: 4.1 g/dL (ref 3.5–5.2)
Alkaline Phosphatase: 66 U/L (ref 39–117)
Bilirubin, Direct: 0.1 mg/dL (ref 0.0–0.3)
Total Bilirubin: 0.5 mg/dL (ref 0.2–1.2)
Total Protein: 6.8 g/dL (ref 6.0–8.3)

## 2018-07-01 LAB — BASIC METABOLIC PANEL
BUN: 23 mg/dL (ref 6–23)
CO2: 25 mEq/L (ref 19–32)
Calcium: 9 mg/dL (ref 8.4–10.5)
Chloride: 105 mEq/L (ref 96–112)
Creatinine, Ser: 1.09 mg/dL (ref 0.40–1.20)
GFR: 48.97 mL/min — ABNORMAL LOW (ref >60.00)
Glucose, Bld: 71 mg/dL (ref 70–99)
Potassium: 4.2 mEq/L (ref 3.5–5.1)
Sodium: 138 mEq/L (ref 135–145)

## 2018-07-01 LAB — HEMOGLOBIN A1C: Hgb A1c MFr Bld: 6.1 % (ref 4.6–6.5)

## 2018-07-01 LAB — PROTIME-INR
INR: 3.2 ratio — ABNORMAL HIGH (ref 0.8–1.0)
Prothrombin Time: 37.1 s — ABNORMAL HIGH (ref 9.6–13.1)

## 2018-07-01 MED ORDER — ZOLPIDEM TARTRATE ER 6.25 MG PO TBCR: 6.2500 mg | EXTENDED_RELEASE_TABLET | Freq: Every evening | ORAL | 0 refills | Status: DC | PRN

## 2018-07-01 NOTE — Patient Instructions (Addendum)
AMBIEN AS NEEDED CONTINUE OXYGEN AS PRESCRIBED CONTINUE NEB THERAPY AS PRESCRIBED

## 2018-07-01 NOTE — Progress Notes (Signed)
Name: Tina Duarte MRN: [id number] DOB: 13-Jun-1945     CONSULTATION DATE: 2.19.2020 REFERRING MD : Tina Duarte    STUDIES:    01/31/2018 CXR independently reviewed by Me No acute pneumonia No edema No effusion NL looking radiograph    CHIEF COMPLAINT: Follow-up COPD   HISTORY OF PRESENT ILLNESS: COPD seems to be stable at this time No signs of exacerbation Shortness of breath and dyspnea exertion at baseline   Signs of infection And intermittent wheezing Uses oxygen 1 L at night As of secondhand smoke exposure And asking for sleeping pills Insomnia    Review of Systems:  Gen:  Denies  fever, sweats, chills weigh loss  +insomnia HEENT: Denies blurred vision, double vision, ear pain, eye pain, hearing loss, nose bleeds, sore throat Cardiac:  No dizziness, chest pain or heaviness, chest tightness,edema, No JVD Resp:   No cough, -sputum production, +shortness of breath,-wheezing, -hemoptysis,  Gi: Denies swallowing difficulty, stomach pain, nausea or vomiting, diarrhea, constipation, bowel incontinence Gu:  Denies bladder incontinence, burning urine Ext:   Denies Joint pain, stiffness or swelling Skin: Denies  skin rash, easy bruising or bleeding or hives Endoc:  Denies polyuria, polydipsia , polyphagia or weight change Psych:   Denies depression, insomnia or hallucinations  Other:  All other systems negative      BP 138/80 (BP Location: Left Arm, Cuff Size: Normal)   Pulse 77   Ht 5\' 4"  (1.626 m)   Wt 230 lb (104.3 kg) Comment: weight is per pt  LMP 04/29/1997   SpO2 95%   BMI 39.48 kg/m   Physical Examination:   GENERAL:NAD, no fevers, chills, no weakness no fatigue HEAD: Normocephalic, atraumatic.  EYES: PERLA, EOMI No scleral icterus.  MOUTH: Moist mucosal membrane.  EAR, NOSE, THROAT: Clear without exudates. No external lesions.  NECK: Supple. No thyromegaly.  No JVD.  PULMONARY: CTA B/L no wheezing, rhonchi, crackles CARDIOVASCULAR: S1 and S2.  Regular rate and rhythm. No murmurs GASTROINTESTINAL: Soft, nontender, nondistended. Positive bowel sounds.  MUSCULOSKELETAL: No swelling, clubbing, or edema.  NEUROLOGIC: No gross focal neurological deficits. 5/5 strength all extremities SKIN: No ulceration, lesions, rashes, or cyanosis.  PSYCHIATRIC: Insight, judgment intact. -depression -anxiety ALL OTHER ROS ARE NEGATIVE                ASSESSMENT / PLAN:  73 year old morbidly obese white female with longstanding smoking exposure with COPD Gold stage C in the setting of chronic hypoxic respiratory failure and deconditioned state   COPD Continue inhalers as prescribed Continue albuterol as needed Indication for prednisone at this time No indication for antibiotics at this time  Chronic hypoxic respiratory failure from COPD Continue oxygen therapy as prescribed She uses and benefits from therapy and needs this to survive   History of blood clots Continue Coumadin as prescribed  Obesity -recommend significant weight loss -recommend changing diet  Deconditioned state -Recommend increased daily activity and exercise    COVID-19 EDUCATION: The signs and symptoms of COVID-19 were discussed with the patient and how to seek care for testing.  The importance of social distancing was discussed today. Hand Washing Techniques and avoid touching face was advised.  MEDICATION ADJUSTMENTS/LABS AND TESTS ORDERED: Ambien 6.25 mg ordered inhalers as prescribed CURRENT MEDICATIONS REVIEWED AT LENGTH WITH PATIENT TODAY   Patient satisfied with Plan of action and management. All questions answered Follow up in 6 months   Deshan Hemmelgarn [name], M.D.  [facility]  Medical Director [place]  Medical Director [place] Department

## 2018-07-01 NOTE — Telephone Encounter (Signed)
Received PA request from [facility] for Ambien 6.25. I have contacted [employer], and was advised that cover alternatives are Ramelteon, Belsomra or Trazodone.   DK please advise if you would like to switch to one of the alternatives.

## 2018-07-02 ENCOUNTER — Other Ambulatory Visit: Payer: Self-pay | Admitting: Internal Medicine

## 2018-07-02 DIAGNOSIS — I10 Essential (primary) hypertension: Secondary | ICD-10-CM

## 2018-07-02 DIAGNOSIS — E7800 Pure hypercholesterolemia, unspecified: Secondary | ICD-10-CM

## 2018-07-02 DIAGNOSIS — Z7901 Long term (current) use of anticoagulants: Secondary | ICD-10-CM

## 2018-07-02 NOTE — Progress Notes (Signed)
Order placed for f/u labs.  

## 2018-07-04 ENCOUNTER — Other Ambulatory Visit: Payer: Self-pay | Admitting: Internal Medicine

## 2018-07-04 DIAGNOSIS — F5101 Primary insomnia: Secondary | ICD-10-CM

## 2018-07-04 MED ORDER — RAMELTEON 8 MG PO TABS: 8.0000 mg | ORAL_TABLET | Freq: Every evening | ORAL | 1 refills | Status: DC | PRN

## 2018-07-04 NOTE — Telephone Encounter (Signed)
I am unable to send this Rx in. It has to be sent by provider.   DK please send. Thanks

## 2018-07-04 NOTE — Telephone Encounter (Signed)
Trazadone 5 mg QHS as needed

## 2018-07-05 NOTE — Telephone Encounter (Signed)
Dr. Mortimer Fries sent in rx for Ramelteon. Called patient, she is aware of the switch. Nothing further at this time.

## 2018-07-09 ENCOUNTER — Other Ambulatory Visit: Payer: Self-pay

## 2018-07-09 ENCOUNTER — Other Ambulatory Visit (INDEPENDENT_AMBULATORY_CARE_PROVIDER_SITE_OTHER): Payer: Medicare Other

## 2018-07-09 DIAGNOSIS — Z7901 Long term (current) use of anticoagulants: Secondary | ICD-10-CM | POA: Diagnosis not present

## 2018-07-09 DIAGNOSIS — E7800 Pure hypercholesterolemia, unspecified: Secondary | ICD-10-CM | POA: Diagnosis not present

## 2018-07-09 DIAGNOSIS — I10 Essential (primary) hypertension: Secondary | ICD-10-CM | POA: Diagnosis not present

## 2018-07-09 LAB — LIPID PANEL
Cholesterol: 187 mg/dL (ref 0–200)
HDL: 57.3 mg/dL (ref >39.00)
LDL Cholesterol: 104 mg/dL — ABNORMAL HIGH (ref 0–99)
NonHDL: 129.79
Total CHOL/HDL Ratio: 3
Triglycerides: 127 mg/dL (ref 0.0–149.0)
VLDL: 25.4 mg/dL (ref 0.0–40.0)

## 2018-07-09 LAB — BASIC METABOLIC PANEL
BUN: 16 mg/dL (ref 6–23)
CO2: 32 mEq/L (ref 19–32)
Calcium: 9.1 mg/dL (ref 8.4–10.5)
Chloride: 101 mEq/L (ref 96–112)
Creatinine, Ser: 1.05 mg/dL (ref 0.40–1.20)
GFR: 51.13 mL/min — ABNORMAL LOW (ref >60.00)
Glucose, Bld: 80 mg/dL (ref 70–99)
Potassium: 4.7 mEq/L (ref 3.5–5.1)
Sodium: 138 mEq/L (ref 135–145)

## 2018-07-09 LAB — PROTIME-INR
INR: 2.3 ratio — ABNORMAL HIGH (ref 0.8–1.0)
Prothrombin Time: 26.8 s — ABNORMAL HIGH (ref 9.6–13.1)

## 2018-07-10 ENCOUNTER — Other Ambulatory Visit: Payer: Self-pay | Admitting: Internal Medicine

## 2018-07-10 DIAGNOSIS — Z7901 Long term (current) use of anticoagulants: Secondary | ICD-10-CM

## 2018-07-10 NOTE — Progress Notes (Signed)
Order placed for f/u pt/inr

## 2018-07-11 DIAGNOSIS — J449 Chronic obstructive pulmonary disease, unspecified: Secondary | ICD-10-CM | POA: Diagnosis not present

## 2018-07-23 ENCOUNTER — Encounter: Payer: Self-pay | Admitting: Internal Medicine

## 2018-07-29 ENCOUNTER — Other Ambulatory Visit: Payer: Medicare Other

## 2018-08-10 DIAGNOSIS — J449 Chronic obstructive pulmonary disease, unspecified: Secondary | ICD-10-CM | POA: Diagnosis not present

## 2018-08-19 ENCOUNTER — Other Ambulatory Visit: Payer: Self-pay

## 2018-08-19 ENCOUNTER — Ambulatory Visit (INDEPENDENT_AMBULATORY_CARE_PROVIDER_SITE_OTHER): Payer: Medicare Other

## 2018-08-19 DIAGNOSIS — Z0000 Encounter for general adult medical examination without abnormal findings: Secondary | ICD-10-CM

## 2018-08-19 NOTE — Progress Notes (Signed)
Subjective:   Tina Duarte is a 73 y.o. female who presents for Medicare Annual (Subsequent) preventive examination.  Review of Systems:  No ROS.  Medicare Wellness Virtual Visit.  Visual/audio telehealth visit, UTA vital signs.   See social history for additional risk factors.  Cardiac Risk Factors include: advanced age (>81men, >64 women);hypertension     Objective:     Vitals: LMP 04/29/1997   There is no height or weight on file to calculate BMI.  Advanced Directives 08/19/2018 01/31/2018 08/19/2017 08/14/2017 04/08/2015 08/19/2014  Does Patient Have a Medical Advance Directive? No No No No No No  Would patient like information on creating a medical advance directive? No - Patient declined No - Patient declined No - Patient declined Yes (MAU/Ambulatory/Procedural Areas - Information given) No - patient declined information Yes - Scientist, clinical (histocompatibility and immunogenetics) given    Tobacco Social History   Tobacco Use  Smoking Status Never Smoker  Smokeless Tobacco Never Used     Counseling given: Not Answered   Clinical Intake:  Pre-visit preparation completed: Yes        Diabetes: No  How often do you need to have someone help you when you read instructions, pamphlets, or other written materials from your doctor or pharmacy?: 1 - Never  Interpreter Needed?: No     Past Medical History:  Diagnosis Date  . Allergy   . Asthma   . Chicken pox   . History of blood clots   . Hypercholesterolemia   . Hypertension    Past Surgical History:  Procedure Laterality Date  . blood clots  2008   Family History  Problem Relation Age of Onset  . Cancer Mother        Breast  . Heart disease Mother   . Stroke Mother   . Hypertension Mother   . Breast cancer Mother        58's  . Heart disease Father   . Stroke Father   . Hypertension Father   . Diabetes Father   . Colon cancer Other        paternal cousin   Social History   Socioeconomic History  . Marital status: Widowed   Spouse name: Not on file  . Number of children: Not on file  . Years of education: Not on file  . Highest education level: Not on file  Occupational History  . Not on file  Social Needs  . Financial resource strain: Not hard at all  . Food insecurity    Worry: Never true    Inability: Never true  . Transportation needs    Medical: No    Non-medical: No  Tobacco Use  . Smoking status: Never Smoker  . Smokeless tobacco: Never Used  Substance and Sexual Activity  . Alcohol use: No    Alcohol/week: 0.0 standard drinks  . Drug use: No  . Sexual activity: Not on file  Lifestyle  . Physical activity    Days per week: 0 days    Minutes per session: Not on file  . Stress: Not at all  Relationships  . Social Herbalist on phone: Not on file    Gets together: Not on file    Attends religious service: Not on file    Active member of club or organization: Not on file    Attends meetings of clubs or organizations: Not on file    Relationship status: Not on file  Other Topics Concern  . Not  on file  Social History Narrative  . Not on file    Outpatient Encounter Medications as of 08/19/2018  Medication Sig  . acetaminophen (TYLENOL) 650 MG CR tablet Take 650 mg by mouth every 8 (eight) hours as needed for pain.  Marland Kitchen albuterol (ACCUNEB) 0.63 MG/3ML nebulizer solution Take 3 mLs (0.63 mg total) by nebulization every 6 (six) hours as needed for wheezing.  Marland Kitchen ALPRAZolam (XANAX) 0.25 MG tablet Take 1 tablet (0.25 mg total) by mouth at bedtime as needed for anxiety.  . budesonide (PULMICORT) 0.5 MG/2ML nebulizer solution Take 2 mLs (0.5 mg total) by nebulization 2 (two) times daily.  . Coenzyme Q10 (COQ10) 30 MG CAPS Take by mouth.  . losartan (COZAAR) 50 MG tablet TAKE 1 TABLET BY MOUTH ONCE A DAY  . PROAIR HFA 108 (90 Base) MCG/ACT inhaler INL 2 PFS ITL Q 6 H PRF WHZ OR SOB  . ramelteon (ROZEREM) 8 MG tablet Take 1 tablet (8 mg total) by mouth at bedtime as needed for sleep.  .  rosuvastatin (CRESTOR) 5 MG tablet Take 1 tablet (5 mg total) by mouth daily.  Marland Kitchen warfarin (COUMADIN) 5 MG tablet Take 1 tablet by mouth once a day on Tuesday, Thursday and Saturday. Take 1/2 tablet by mouth once a day on Monday, Wednesday, Friday and Sunday.  . zolpidem (AMBIEN CR) 6.25 MG CR tablet Take 1 tablet (6.25 mg total) by mouth at bedtime as needed for sleep.   No facility-administered encounter medications on file as of 08/19/2018.     Activities of Daily Living In your present state of health, do you have any difficulty performing the following activities: 08/19/2018 01/31/2018  Hearing? N N  Vision? N N  Difficulty concentrating or making decisions? N N  Walking or climbing stairs? Y N  Dressing or bathing? N N  Doing errands, shopping? Y Y  Comment She does not drive Facilities manager and eating ? Y -  Comment She does not cook. Meals prepared by her son. [place]gu[place] feeds. -  Using the Toilet? N -  In the past six months, have you accidently leaked urine? Y -  Comment Managed with daily pad/liner. -  Do you have problems with loss of bowel control? N -  Managing your Medications? Y -  Comment Son assists as needed -  Managing your Finances? Y -  Comment Son assists with financial management -  Housekeeping or managing your Housekeeping? Y -  Comment Son assists with household chores -  Some recent data might be hidden    Patient Care Team: Einar Pheasant, MD as PCP - General (Internal Medicine)    Assessment:   This is a routine wellness examination for Tina Duarte.  I connected with patient 08/19/18 at  9:00 AM EDT by an audio enabled telemedicine application and verified that I am speaking with the correct person using two identifiers. Patient stated full name and DOB. Patient gave permission to continue with virtual visit. Patient's location was at home and Nurse's location was at [place] office.   Oxygen 1L in use at bedtime. Nebulizer in use as needed.   Keep routine  maintenance follow up with pcp tomorrow.   Health Screenings  Mammogram - 03/2016. Postponed per patient preference. Follow up with pcp. Colonoscopy - 03/2010 Bone Density - 06/2016 Glaucoma -none Hearing -demonstrates normal hearing during visit. Hemoglobin A1C - 07/2018 (6.1) Cholesterol - 07/2018  Dental- UTD Vision- wears reading glasses. UTD  Social  Alcohol intake -  no          Smoking history- never   Smokers in home? none Illicit drug use? none Exercise - none Diet - regular Sexually Active -not currently BMI- discussed the importance of a healthy diet, water intake and the benefits of aerobic exercise.  Educational material provided.   Safety  Patient feels safe at home- yes Patient does have smoke detectors at home- yes Patient does wear sunscreen or protective clothing when in direct sunlight -yes Patient does wear seat belt when in a moving vehicle -yes Patient drives- no Hallways free of throw rugs, extension cords etc- yes Adequate lighting in the home- yes Life alert- no Handrails in use when available- yes  Covid-19 precautions and sickness symptoms discussed.   Activities of Daily Living Patient denies needing assistance with: feeding themselves, getting from bed to chair, getting to the toilet, bathing/showering and dressing.  Son assists with household chores, managing money and medications as needed.   Depression Screen Patient denies losing interest in daily life, feeling hopeless, or crying easily over simple problems.   Medication-taking as directed and without issues.   Fall Screen Patient has had one fall by slipping while in the home several months ago. No injury. No assistive device in use.    Memory Screen Patient is alert.  Patient denies difficulty focusing, concentrating or misplacing items. Correctly identified the president of the [place], season, month, year and recall 2/3. Patient likes to play word games for brain stimulation.   Immunizations The following Immunizations were discussed: Influenza, shingles, pneumonia, and tetanus.   Other Providers Patient Care Team: Einar Pheasant, MD as PCP - General (Internal Medicine)  Exercise Activities and Dietary recommendations Current Exercise Habits: The patient does not participate in regular exercise at present  Goals      Patient Stated   . Weight (lb) < 230 lb (104.3 kg) (pt-stated)     Healthier diet       Fall Risk Fall Risk  08/19/2018 08/14/2017 12/21/2015 09/15/2014 10/21/2012  Falls in the past year? 1 No No Yes Yes  Number falls in past yr: 0 - - 2 or more 1  Injury with Fall? 0 - - No No   Depression Screen PHQ 2/9 Scores 08/19/2018 08/14/2017 12/21/2015 09/15/2014  PHQ - 2 Score 0 0 0 0     Cognitive Function     6CIT Screen 08/19/2018 08/14/2017  What Year? 0 points 0 points  What month? 0 points 0 points  What time? 0 points 0 points  Count back from 20 0 points 0 points  Months in reverse 0 points 0 points  Repeat phrase 0 points 0 points  Total Score 0 0    Immunization History  Administered Date(s) Administered  . Influenza, High Dose Seasonal PF 04/10/2017  . Influenza,inj,Quad PF,6+ Mos 10/21/2012, 01/07/2014, 03/07/2016  . Influenza,inj,quad, With Preservative 03/02/2016  . Influenza-Unspecified 02/28/2011, 12/16/2014  . Pneumococcal Conjugate-13 04/30/2010  . Pneumococcal Polysaccharide-23 05/23/2016   Screening Tests Health Maintenance  Topic Date Due  . MAMMOGRAM  03/14/2017  . INFLUENZA VACCINE  08/31/2018  . TETANUS/TDAP  02/08/2020  . COLONOSCOPY  04/29/2020  . DEXA SCAN  Completed  . Hepatitis C Screening  Completed  . PNA vac Low Risk Adult  Completed      Plan:   End of life planning; Advanced aging; Advanced directives discussed.  No HCPOA/Living Will.  Additional information declined at this time.  I have personally reviewed and noted the following in  the patient's chart:   . Medical and social history .  Use of alcohol, tobacco or illicit drugs  . Current medications and supplements . Functional ability and status . Nutritional status . Physical activity . Advanced directives . List of other physicians . Hospitalizations, surgeries, and ER visits in previous 12 months . Vitals . Screenings to include cognitive, depression, and falls . Referrals and appointments  In addition, I have reviewed and discussed with patient certain preventive protocols, quality metrics, and best practice recommendations. A written personalized care plan for preventive services as well as general preventive health recommendations were provided to patient.     Varney Biles, LPN  8/61/4830   Reviewed above information.  Agree with assessment and plan.    Dr Nicki Reaper

## 2018-08-19 NOTE — Patient Instructions (Addendum)
  Tina Duarte , Thank you for taking time to come for your Medicare Wellness Visit. I appreciate your ongoing commitment to your health goals. Please review the following plan we discussed and let me know if I can assist you in the future.   These are the goals we discussed: Goals      Patient Stated   . Weight (lb) < 230 lb (104.3 kg) (pt-stated)     Healthier diet       This is a list of the screening recommended for you and due dates:  Health Maintenance  Topic Date Due  . Mammogram  03/14/2017  . Flu Shot  08/31/2018  . Tetanus Vaccine  02/08/2020  . Colon Cancer Screening  04/29/2020  . DEXA scan (bone density measurement)  Completed  .  Hepatitis C: One time screening is recommended by Center for Disease Control  (CDC) for  adults born from [birth year] through 1967.   Completed  . Pneumonia vaccines  Completed

## 2018-08-20 ENCOUNTER — Other Ambulatory Visit: Payer: Self-pay

## 2018-08-20 ENCOUNTER — Ambulatory Visit (INDEPENDENT_AMBULATORY_CARE_PROVIDER_SITE_OTHER): Payer: Medicare Other | Admitting: Internal Medicine

## 2018-08-20 ENCOUNTER — Ambulatory Visit: Payer: Medicare Other

## 2018-08-20 DIAGNOSIS — R739 Hyperglycemia, unspecified: Secondary | ICD-10-CM | POA: Diagnosis not present

## 2018-08-20 DIAGNOSIS — E7800 Pure hypercholesterolemia, unspecified: Secondary | ICD-10-CM

## 2018-08-20 DIAGNOSIS — J9601 Acute respiratory failure with hypoxia: Secondary | ICD-10-CM | POA: Diagnosis not present

## 2018-08-20 DIAGNOSIS — Z86718 Personal history of other venous thrombosis and embolism: Secondary | ICD-10-CM

## 2018-08-20 DIAGNOSIS — R5383 Other fatigue: Secondary | ICD-10-CM

## 2018-08-20 DIAGNOSIS — I10 Essential (primary) hypertension: Secondary | ICD-10-CM

## 2018-08-20 MED ORDER — LOSARTAN POTASSIUM 50 MG PO TABS: ORAL_TABLET | ORAL | 1 refills | Status: DC

## 2018-08-20 MED ORDER — WARFARIN SODIUM 5 MG PO TABS: ORAL_TABLET | ORAL | 1 refills | Status: DC

## 2018-08-20 MED ORDER — ROSUVASTATIN CALCIUM 5 MG PO TABS: 5.0000 mg | ORAL_TABLET | Freq: Every day | ORAL | 1 refills | Status: DC

## 2018-08-20 NOTE — Progress Notes (Signed)
Virtual Visit via telephone Note  This visit type was conducted due to national recommendations for restrictions regarding the COVID-19 pandemic (e.g. social distancing).  This format is felt to be most appropriate for this patient at this time.  All issues noted in this document were discussed and addressed.  No physical exam was performed (except for noted visual exam findings with Video Visits).   I connected with Tina Duarte by telephone and verified that I am speaking with the correct person using two identifiers. Location patient: home Location provider: work Persons participating in the telephone visit: patient, provider  I discussed the limitations, risks, security and privacy concerns of performing an evaluation and management service by telephone and the availability of in person appointments. The patient expressed understanding and agreed to proceed.   Reason for visit:  Scheduled follow up.    HPI: Followed by pulmonary for her COPD.  Last evaluated by Dr Mortimer Fries 07/01/18.  Felt stable.  Recommended continuing current inhaler regimen.  Recommended continuing coumadin and oxygen.  States she feels her breathing is stable.  No increased cough, congestion or sob.  No fever.  No acid reflux.  No abdominal pain.  Bowels moving.  S/p knee injection.  Knee is doing ok.  Declines mammogram.  Does report feeling weak.  States when she exerts herself, she has to sit down and rest.  Feels this has worsened.  No actual chest pain.  Discussed multifactorial.  Breathing overall is stable to some better.  Discussed deconditioning.  Also discussed possible cardiac etiology.  Discussed risk factors.  She reports that her son is quarantined.  Works at [employer] and a Mudlogger tested positive for [employer].  Her son has not been sick, but is quarantined.  Discussed [place] quarantine for her as well.     ROS: See pertinent positives and negatives per HPI.  Past Medical History:  Diagnosis Date  . Allergy    . Asthma   . Chicken pox   . History of blood clots   . Hypercholesterolemia   . Hypertension     Past Surgical History:  Procedure Laterality Date  . blood clots  2008    Family History  Problem Relation Age of Onset  . Cancer Mother        Breast  . Heart disease Mother   . Stroke Mother   . Hypertension Mother   . Breast cancer Mother        83's  . Heart disease Father   . Stroke Father   . Hypertension Father   . Diabetes Father   . Colon cancer Other        paternal cousin    SOCIAL HX: reviewed.    Current Outpatient Medications:  .  acetaminophen (TYLENOL) 650 MG CR tablet, Take 650 mg by mouth every 8 (eight) hours as needed for pain., Disp: , Rfl:  .  albuterol (ACCUNEB) 0.63 MG/3ML nebulizer solution, Take 3 mLs (0.63 mg total) by nebulization every 6 (six) hours as needed for wheezing., Disp: 75 mL, Rfl: 12 .  ALPRAZolam (XANAX) 0.25 MG tablet, Take 1 tablet (0.25 mg total) by mouth at bedtime as needed for anxiety., Disp: 20 tablet, Rfl: 0 .  budesonide (PULMICORT) 0.5 MG/2ML nebulizer solution, Take 2 mLs (0.5 mg total) by nebulization 2 (two) times daily., Disp: 240 mL, Rfl: 2 .  Coenzyme Q10 (COQ10) 30 MG CAPS, Take by mouth., Disp: , Rfl:  .  losartan (COZAAR) 50 MG tablet, TAKE 1  TABLET BY MOUTH ONCE A DAY, Disp: 90 tablet, Rfl: 1 .  PROAIR HFA 108 (90 Base) MCG/ACT inhaler, INL 2 PFS ITL Q 6 H PRF WHZ OR SOB, Disp: , Rfl:  .  ramelteon (ROZEREM) 8 MG tablet, Take 1 tablet (8 mg total) by mouth at bedtime as needed for sleep., Disp: 30 tablet, Rfl: 1 .  rosuvastatin (CRESTOR) 5 MG tablet, Take 1 tablet (5 mg total) by mouth daily., Disp: 90 tablet, Rfl: 1 .  warfarin (COUMADIN) 5 MG tablet, Take 5 mg on Wednesday and Sunday, 2.5 mg all other days., Disp: 30 tablet, Rfl: 1 .  zolpidem (AMBIEN CR) 6.25 MG CR tablet, Take 1 tablet (6.25 mg total) by mouth at bedtime as needed for sleep., Disp: 30 tablet, Rfl: 0  EXAM:  GENERAL: alert.  Answering  questions appropriately.  Sounds to be in no acute distress.    PSYCH/NEURO: pleasant and cooperative, no obvious depression or anxiety, speech and thought processing grossly intact  ASSESSMENT AND PLAN:  Discussed the following assessment and plan:  Essential hypertension, benign Blood pressure has been doing well.  Continue current medication regimen.  Follow pressures.  Follow metabolic panel.    History of blood clots On coumadin.  Overdue pt/inr. Discussed with her today the need to be rechecked.  She will see if she can get a ride.  Discussed [place] quarantine for son (and her).  Will call with ride and to schedule f/u pt/inr.    Hypercholesterolemia Low cholesterol diet and exercise.  Follow lipid panel.    Hyperglycemia Low carb diet and exercise. Follow met b and a1c.   Respiratory failure with hypoxia (HCC) On oxygen. Seeing pulmonary.  Overall feels breathing is stable.  Follow.   Fatigue Reports increased fatigue with exertion.  Discussed probably multifactorial.  Discussed deconditioning.  Also discussed her underlying lung issues.  On oxygen.  Breathing per her report - better.  Given increased fatigue with exertion, will have cardiology evaluate to confirm no further cardiac w/up warranted given risk factors.  Question of need for ECHO to assess PAP, etc.  Pt in agreement.      I discussed the assessment and treatment plan with the patient. The patient was provided an opportunity to ask questions and all were answered. The patient agreed with the plan and demonstrated an understanding of the instructions.   The patient was advised to call back or seek an in-person evaluation if the symptoms worsen or if the condition fails to improve as anticipated.  I provided 25 minutes of non-face-to-face time during this encounter.   Einar Pheasant, MD

## 2018-08-25 ENCOUNTER — Encounter: Payer: Self-pay | Admitting: Internal Medicine

## 2018-08-25 DIAGNOSIS — R531 Weakness: Secondary | ICD-10-CM | POA: Insufficient documentation

## 2018-08-25 NOTE — Assessment & Plan Note (Signed)
Reports increased fatigue with exertion.  Discussed probably multifactorial.  Discussed deconditioning.  Also discussed her underlying lung issues.  On oxygen.  Breathing per her report - better.  Given increased fatigue with exertion, will have cardiology evaluate to confirm no further cardiac w/up warranted given risk factors.  Question of need for ECHO to assess PAP, etc.  Pt in agreement.

## 2018-08-25 NOTE — Assessment & Plan Note (Signed)
Low cholesterol diet and exercise.  Follow lipid panel.   

## 2018-08-25 NOTE — Assessment & Plan Note (Signed)
On coumadin.  Overdue pt/inr. Discussed with her today the need to be rechecked.  She will see if she can get a ride.  Discussed [place] quarantine for son (and her).  Will call with ride and to schedule f/u pt/inr.

## 2018-08-25 NOTE — Assessment & Plan Note (Signed)
Low carb diet and exercise. Follow met b and a1c.  

## 2018-08-25 NOTE — Assessment & Plan Note (Signed)
Blood pressure has been doing well.  Continue current medication regimen.  Follow pressures.  Follow metabolic panel.  

## 2018-08-25 NOTE — Assessment & Plan Note (Signed)
On oxygen. Seeing pulmonary.  Overall feels breathing is stable.  Follow.

## 2018-09-03 ENCOUNTER — Other Ambulatory Visit: Payer: Self-pay

## 2018-09-03 MED ORDER — CITALOPRAM HYDROBROMIDE 20 MG PO TABS: 20.0000 mg | ORAL_TABLET | Freq: Every day | ORAL | 2 refills | Status: DC

## 2018-09-03 NOTE — Telephone Encounter (Signed)
rx ok'd for citalopram #30 with 2 refills.

## 2018-09-05 ENCOUNTER — Other Ambulatory Visit: Payer: Self-pay

## 2018-09-05 ENCOUNTER — Other Ambulatory Visit (INDEPENDENT_AMBULATORY_CARE_PROVIDER_SITE_OTHER): Payer: Medicare Other

## 2018-09-05 DIAGNOSIS — R5383 Other fatigue: Secondary | ICD-10-CM

## 2018-09-05 DIAGNOSIS — Z86718 Personal history of other venous thrombosis and embolism: Secondary | ICD-10-CM

## 2018-09-05 DIAGNOSIS — I10 Essential (primary) hypertension: Secondary | ICD-10-CM | POA: Diagnosis not present

## 2018-09-05 LAB — CBC WITH DIFFERENTIAL/PLATELET
Basophils Absolute: 0.1 10*3/uL (ref 0.0–0.1)
Basophils Relative: 0.7 % (ref 0.0–3.0)
Eosinophils Absolute: 0.3 10*3/uL (ref 0.0–0.7)
Eosinophils Relative: 2.8 % (ref 0.0–5.0)
HCT: 42.7 % (ref 36.0–46.0)
Hemoglobin: 14.2 g/dL (ref 12.0–15.0)
Lymphocytes Relative: 30.4 % (ref 12.0–46.0)
Lymphs Abs: 2.9 10*3/uL (ref 0.7–4.0)
MCHC: 33.3 g/dL (ref 30.0–36.0)
MCV: 88.8 fl (ref 78.0–100.0)
Monocytes Absolute: 0.6 10*3/uL (ref 0.1–1.0)
Monocytes Relative: 6.2 % (ref 3.0–12.0)
Neutro Abs: 5.7 10*3/uL (ref 1.4–7.7)
Neutrophils Relative %: 59.9 % (ref 43.0–77.0)
Platelets: 302 10*3/uL (ref 150.0–400.0)
RBC: 4.81 Mil/uL (ref 3.87–5.11)
RDW: 14.6 % (ref 11.5–15.5)
WBC: 9.6 10*3/uL (ref 4.0–10.5)

## 2018-09-05 LAB — BASIC METABOLIC PANEL
BUN: 17 mg/dL (ref 6–23)
CO2: 26 mEq/L (ref 19–32)
Calcium: 9.1 mg/dL (ref 8.4–10.5)
Chloride: 104 mEq/L (ref 96–112)
Creatinine, Ser: 1.23 mg/dL — ABNORMAL HIGH (ref 0.40–1.20)
GFR: 42.58 mL/min — ABNORMAL LOW (ref >60.00)
Glucose, Bld: 78 mg/dL (ref 70–99)
Potassium: 4.5 mEq/L (ref 3.5–5.1)
Sodium: 139 mEq/L (ref 135–145)

## 2018-09-05 LAB — TSH: TSH: 4.07 u[IU]/mL (ref 0.35–4.50)

## 2018-09-05 LAB — PROTIME-INR
INR: 3.4 ratio — ABNORMAL HIGH (ref 0.8–1.0)
Prothrombin Time: 38.9 s — ABNORMAL HIGH (ref 9.6–13.1)

## 2018-09-06 ENCOUNTER — Other Ambulatory Visit: Payer: Self-pay | Admitting: Internal Medicine

## 2018-09-06 DIAGNOSIS — R944 Abnormal results of kidney function studies: Secondary | ICD-10-CM

## 2018-09-06 DIAGNOSIS — Z7901 Long term (current) use of anticoagulants: Secondary | ICD-10-CM

## 2018-09-06 NOTE — Progress Notes (Signed)
Order placed for f/u labs.  

## 2018-09-10 DIAGNOSIS — J449 Chronic obstructive pulmonary disease, unspecified: Secondary | ICD-10-CM | POA: Diagnosis not present

## 2018-09-11 ENCOUNTER — Other Ambulatory Visit (INDEPENDENT_AMBULATORY_CARE_PROVIDER_SITE_OTHER): Payer: Medicare Other

## 2018-09-11 ENCOUNTER — Other Ambulatory Visit: Payer: Self-pay | Admitting: Internal Medicine

## 2018-09-11 ENCOUNTER — Other Ambulatory Visit: Payer: Self-pay

## 2018-09-11 DIAGNOSIS — Z7901 Long term (current) use of anticoagulants: Secondary | ICD-10-CM | POA: Diagnosis not present

## 2018-09-11 DIAGNOSIS — R944 Abnormal results of kidney function studies: Secondary | ICD-10-CM

## 2018-09-11 LAB — BASIC METABOLIC PANEL
BUN: 19 mg/dL (ref 6–23)
CO2: 26 mEq/L (ref 19–32)
Calcium: 9.2 mg/dL (ref 8.4–10.5)
Chloride: 104 mEq/L (ref 96–112)
Creatinine, Ser: 0.98 mg/dL (ref 0.40–1.20)
GFR: 55.34 mL/min — ABNORMAL LOW (ref >60.00)
Glucose, Bld: 103 mg/dL — ABNORMAL HIGH (ref 70–99)
Potassium: 4.3 mEq/L (ref 3.5–5.1)
Sodium: 139 mEq/L (ref 135–145)

## 2018-09-11 LAB — HEPATIC FUNCTION PANEL
ALT: 15 U/L (ref 0–35)
AST: 16 U/L (ref 0–37)
Albumin: 4.2 g/dL (ref 3.5–5.2)
Alkaline Phosphatase: 58 U/L (ref 39–117)
Bilirubin, Direct: 0.2 mg/dL (ref 0.0–0.3)
Total Bilirubin: 0.7 mg/dL (ref 0.2–1.2)
Total Protein: 6.7 g/dL (ref 6.0–8.3)

## 2018-09-11 LAB — PROTIME-INR
INR: 3.4 ratio — ABNORMAL HIGH (ref 0.8–1.0)
Prothrombin Time: 39 s — ABNORMAL HIGH (ref 9.6–13.1)

## 2018-09-11 NOTE — Progress Notes (Signed)
Order placed for f/u pt/inr 

## 2018-09-12 ENCOUNTER — Other Ambulatory Visit: Payer: Self-pay

## 2018-09-12 DIAGNOSIS — J441 Chronic obstructive pulmonary disease with (acute) exacerbation: Secondary | ICD-10-CM

## 2018-09-12 MED ORDER — BUDESONIDE 0.5 MG/2ML IN SUSP: 0.5000 mg | Freq: Two times a day (BID) | RESPIRATORY_TRACT | 2 refills | Status: DC

## 2018-09-30 ENCOUNTER — Other Ambulatory Visit (INDEPENDENT_AMBULATORY_CARE_PROVIDER_SITE_OTHER): Payer: Medicare Other

## 2018-09-30 ENCOUNTER — Other Ambulatory Visit: Payer: Self-pay

## 2018-09-30 DIAGNOSIS — Z7901 Long term (current) use of anticoagulants: Secondary | ICD-10-CM

## 2018-09-30 LAB — PROTIME-INR
INR: 3.2 ratio — ABNORMAL HIGH (ref 0.8–1.0)
Prothrombin Time: 37.1 s — ABNORMAL HIGH (ref 9.6–13.1)

## 2018-10-01 ENCOUNTER — Other Ambulatory Visit: Payer: Self-pay | Admitting: Internal Medicine

## 2018-10-01 DIAGNOSIS — Z7901 Long term (current) use of anticoagulants: Secondary | ICD-10-CM

## 2018-10-01 DIAGNOSIS — E7800 Pure hypercholesterolemia, unspecified: Secondary | ICD-10-CM

## 2018-10-01 DIAGNOSIS — I10 Essential (primary) hypertension: Secondary | ICD-10-CM

## 2018-10-01 DIAGNOSIS — R739 Hyperglycemia, unspecified: Secondary | ICD-10-CM

## 2018-10-01 NOTE — Progress Notes (Signed)
Order placed for f/u labs.  

## 2018-10-11 DIAGNOSIS — J449 Chronic obstructive pulmonary disease, unspecified: Secondary | ICD-10-CM | POA: Diagnosis not present

## 2018-10-17 NOTE — Progress Notes (Signed)
Cardiology Office Note  Date:  10/21/2018   ID:  Tina Duarte, DOB 08-Nov-1945, MRN [id number]  PCP:  Einar Pheasant, MD   Chief Complaint  Patient presents with  . New Patient (Initial Visit)    referred by pcp. Patient c/o increased SOB. Meds reviewed verbally with patient.     HPI:  [name] is a 73 year old woman with past medical history of COPD, followed by pulmonary, home oxygen at night Hypertension Hyperlipidemia Factor 5 leiden, on warfarin Morbid obesity Who presents by referral from Dr. Einar Pheasant for increased fatigue with exertion.    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  PSH:    Past Surgical History:  Procedure Laterality Date  . blood clots  05-09-06    Current Outpatient Medications  Medication Sig Dispense Refill  . acetaminophen (TYLENOL) 650 MG CR tablet Take 650 mg by mouth every 8 (eight) hours as needed for pain.    Marland Kitchen albuterol (ACCUNEB) 0.63 MG/3ML nebulizer solution Take 3 mLs (0.63 mg total) by nebulization every 6 (six) hours as needed for wheezing.  75 mL 12  . citalopram (CELEXA) 20 MG tablet Take 1 tablet (20 mg total) by mouth daily. 30 tablet 2  . Coenzyme Q10 (COQ10) 30 MG CAPS Take by mouth.    . losartan (COZAAR) 50 MG tablet TAKE 1 TABLET BY MOUTH ONCE A DAY 90 tablet 1  . PROAIR HFA 108 (90 Base) MCG/ACT inhaler INL 2 PFS ITL Q 6 H PRF WHZ OR SOB    . rosuvastatin (CRESTOR) 5 MG tablet Take 1 tablet (5 mg total) by mouth daily. 90 tablet 1  . warfarin (COUMADIN) 5 MG tablet Take 5 mg on Wednesday and Sunday, 2.5 mg all other days. 30 tablet 1   No current facility-administered medications for this visit.      Allergies:   Patient has no known allergies.   Social History:  The patient  reports that she has never smoked. She has never used smokeless tobacco. She reports that she does not drink alcohol or use drugs.   Family History:   family history includes Breast cancer in her mother; Cancer in her mother; Colon cancer in an other family member; Diabetes in her father; Heart disease in her father and mother; Hypertension in her father and mother; Stroke in her father and mother.    Review of Systems: Review of Systems  Constitutional: Positive for malaise/fatigue.  HENT: Negative.   Respiratory: Positive for shortness of breath.   Cardiovascular: Negative.   Gastrointestinal: Negative.   Musculoskeletal:  Negative.   Neurological: Negative.   Psychiatric/Behavioral: Negative.   All other systems reviewed and are negative.   PHYSICAL EXAM: VS:  BP 120/70 (BP Location: Right Arm, Patient Position: Sitting, Cuff Size: Large)   Pulse 90   Ht 5\' 4"  (1.626 m)   Wt 243 lb (110.2 kg)   LMP 04/29/1997   BMI 41.71 kg/m  , BMI Body mass index is 41.71 kg/m. GEN: Well nourished, well developed, in no acute distress HEENT: normal Neck: no JVD, carotid bruits, or masses Cardiac: RRR; no murmurs, rubs, or gallops,no edema  Respiratory:  clear to auscultation bilaterally, normal work of breathing GI: soft, nontender,  nondistended, + BS MS: no deformity or atrophy Skin: warm and dry, no rash Neuro:  Strength and sensation are intact Psych: euthymic mood, full affect  Recent Labs: 09/05/2018: Hemoglobin 14.2; Platelets 302.0; TSH 4.07 09/11/2018: ALT 15; BUN 19; Creatinine, Ser 0.98; Potassium 4.3; Sodium 139    Lipid Panel Lab Results  Component Value Date   CHOL 187 07/09/2018   HDL 57.30 07/09/2018   LDLCALC 104 (H) 07/09/2018   TRIG 127.0 07/09/2018      Wt Readings from Last 3 Encounters:  10/21/18 243 lb (110.2 kg)  08/20/18 230 lb 2.6 oz (104.4 kg)  07/01/18 230 lb (104.3 kg)     ASSESSMENT AND PLAN:  Problem List Items Addressed This Visit      Cardiology Problems   Essential hypertension, benign - Primary   Relevant Orders   EKG 12-Lead   Hypercholesterolemia     Other   Weakness   Respiratory failure with hypoxia (HCC)   Relevant Orders   EKG 12-Lead    Other Visit Diagnoses    Centrilobular emphysema (HCC)         Shortness of breath Weight markedly elevated, 30 pounds in the past 4 years Up from 210 in 2016, Now 240 pounds Eating wrong foods Also very deconditioned, has not done much activity in the past 4 years Reports that her son is complaining she does not get up Symptoms less likely cardiac in etiology but we will order echocardiogram to confirm normal cardiac function, rule out pulmonary hypertension --Strongly recommended she participate in long works, we will send in a referral -She gave up driving because she did not want to drive anymore for unclear reasons, lost her independence.  Says it was because of the money, did not want to pay car insurance.  COPD Followed by pulmonary, on inhalers, no recent exacerbations Long history secondhand smoke  Morbid obesity We have encouraged continued exercise, careful diet management in an effort to lose weight.  Disposition:   F/U as needed   Total encounter time more than 45 minutes  Greater than 50% was  spent in counseling and coordination of care with the patient    Signed, Esmond Plants, M.D., Ph.D. [place], [place]

## 2018-10-21 ENCOUNTER — Encounter: Payer: Self-pay | Admitting: Cardiovascular Disease

## 2018-10-21 ENCOUNTER — Other Ambulatory Visit: Payer: Self-pay

## 2018-10-21 ENCOUNTER — Ambulatory Visit (INDEPENDENT_AMBULATORY_CARE_PROVIDER_SITE_OTHER): Payer: Medicare Other | Admitting: Cardiovascular Disease

## 2018-10-21 VITALS — BP 120/70 | HR 90 | Ht 64.0 in | Wt 243.0 lb

## 2018-10-21 DIAGNOSIS — J449 Chronic obstructive pulmonary disease, unspecified: Secondary | ICD-10-CM

## 2018-10-21 DIAGNOSIS — I10 Essential (primary) hypertension: Secondary | ICD-10-CM | POA: Diagnosis not present

## 2018-10-21 DIAGNOSIS — R0602 Shortness of breath: Secondary | ICD-10-CM

## 2018-10-21 DIAGNOSIS — E7800 Pure hypercholesterolemia, unspecified: Secondary | ICD-10-CM

## 2018-10-21 DIAGNOSIS — J432 Centrilobular emphysema: Secondary | ICD-10-CM | POA: Diagnosis not present

## 2018-10-21 DIAGNOSIS — J9611 Chronic respiratory failure with hypoxia: Secondary | ICD-10-CM

## 2018-10-21 DIAGNOSIS — R5382 Chronic fatigue, unspecified: Secondary | ICD-10-CM

## 2018-10-21 NOTE — Patient Instructions (Addendum)
Medication Instructions:  No changes  If you need a refill on your cardiac medications before your next appointment, please call your pharmacy.    Lab work: No new labs needed   If you have labs (blood work) drawn today and your tests are completely normal, you will receive your results only by: Marland Kitchen MyChart Message (if you have MyChart) OR . A paper copy in the mail If you have any lab test that is abnormal or we need to change your treatment, we will call you to review the results.   Testing/Procedures: - Your physician has requested that you have an echocardiogram. Echocardiography is a painless test that uses sound waves to create images of your heart. It provides your doctor with information about the size and shape of your heart and how well your heart's chambers and valves are working. This procedure takes approximately one hour. There are no restrictions for this procedure.  - Your physician has referred you to Pulmonary Rehab at [facility] They will call you directly to schedule.    Follow-Up: At [facility], you and your health needs are our priority.  As part of our continuing mission to provide you with exceptional heart care, we have created designated Provider Care Teams.  These Care Teams include your primary Cardiologist (physician) and Advanced Practice Providers (APPs -  Physician Assistants and Nurse Practitioners) who all work together to provide you with the care you need, when you need it.  . You will need a follow up appointment as needed  . Providers on your designated Care Team:   . Murray Hodgkins, NP . Christell Faith, PA-C . Marrianne Mood, PA-C  Any Other Special Instructions Will Be Listed Below (If Applicable).  For educational health videos Log in to : [email or web address] Or : [email or web address], password : [email or web address]   Echocardiogram An echocardiogram is a procedure that uses painless sound waves (ultrasound) to produce an image of the heart. Images  from an echocardiogram can provide important information about:  Signs of coronary artery disease (CAD).  Aneurysm detection. An aneurysm is a weak or damaged part of an artery wall that bulges out from the normal force of blood pumping through the body.  Heart size and shape. Changes in the size or shape of the heart can be associated with certain conditions, including heart failure, aneurysm, and CAD.  Heart muscle function.  Heart valve function.  Signs of a past heart attack.  Fluid buildup around the heart.  Thickening of the heart muscle.  A tumor or infectious growth around the heart valves. Tell a health care provider about:  Any allergies you have.  All medicines you are taking, including vitamins, herbs, eye drops, creams, and over-the-counter medicines.  Any blood disorders you have.  Any surgeries you have had.  Any medical conditions you have.  Whether you are pregnant or may be pregnant. What are the risks? Generally, this is a safe procedure. However, problems may occur, including:  Allergic reaction to dye (contrast) that may be used during the procedure. What happens before the procedure? No specific preparation is needed. You may eat and drink normally. What happens during the procedure?   An IV tube may be inserted into one of your veins.  You may receive contrast through this tube. A contrast is an injection that improves the quality of the pictures from your heart.  A gel will be applied to your chest.  A wand-like tool (transducer) will be moved over your chest.  The gel will help to transmit the sound waves from the transducer.  The sound waves will harmlessly bounce off of your heart to allow the heart images to be captured in real-time motion. The images will be recorded on a computer. The procedure may vary among health care providers and hospitals. What happens after the procedure?  You may return to your normal, everyday life, including  diet, activities, and medicines, unless your health care provider tells you not to do that. Summary  An echocardiogram is a procedure that uses painless sound waves (ultrasound) to produce an image of the heart.  Images from an echocardiogram can provide important information about the size and shape of your heart, heart muscle function, heart valve function, and fluid buildup around your heart.  You do not need to do anything to prepare before this procedure. You may eat and drink normally.  After the echocardiogram is completed, you may return to your normal, everyday life, unless your health care provider tells you not to do that. This information is not intended to replace advice given to you by your health care provider. Make sure you discuss any questions you have with your health care provider. Document Released: 01/14/2000 Document Revised: 05/09/2018 Document Reviewed: 02/19/2016 Elsevier Patient Education  2020 [employer].

## 2018-10-22 NOTE — Addendum Note (Signed)
Addended by: Alvis Lemmings C on: 10/22/2018 03:36 PM   Modules accepted: Orders

## 2018-11-10 DIAGNOSIS — J449 Chronic obstructive pulmonary disease, unspecified: Secondary | ICD-10-CM | POA: Diagnosis not present

## 2018-11-18 ENCOUNTER — Encounter: Payer: Medicare Other | Attending: Cardiovascular Disease

## 2018-11-18 DIAGNOSIS — J449 Chronic obstructive pulmonary disease, unspecified: Secondary | ICD-10-CM | POA: Insufficient documentation

## 2018-11-18 DIAGNOSIS — R0602 Shortness of breath: Secondary | ICD-10-CM | POA: Insufficient documentation

## 2018-11-19 ENCOUNTER — Other Ambulatory Visit: Payer: Self-pay

## 2018-11-19 ENCOUNTER — Other Ambulatory Visit (INDEPENDENT_AMBULATORY_CARE_PROVIDER_SITE_OTHER): Payer: Medicare Other

## 2018-11-19 VITALS — Ht 64.0 in | Wt 240.6 lb

## 2018-11-19 DIAGNOSIS — Z7901 Long term (current) use of anticoagulants: Secondary | ICD-10-CM

## 2018-11-19 DIAGNOSIS — R0602 Shortness of breath: Secondary | ICD-10-CM | POA: Diagnosis not present

## 2018-11-19 DIAGNOSIS — R739 Hyperglycemia, unspecified: Secondary | ICD-10-CM

## 2018-11-19 DIAGNOSIS — I10 Essential (primary) hypertension: Secondary | ICD-10-CM | POA: Diagnosis not present

## 2018-11-19 DIAGNOSIS — E7800 Pure hypercholesterolemia, unspecified: Secondary | ICD-10-CM

## 2018-11-19 DIAGNOSIS — J449 Chronic obstructive pulmonary disease, unspecified: Secondary | ICD-10-CM | POA: Diagnosis not present

## 2018-11-19 NOTE — Addendum Note (Signed)
Addended by: Leeanne Rio on: 11/19/2018 03:09 PM   Modules accepted: Orders

## 2018-11-19 NOTE — Patient Instructions (Signed)
Patient Instructions  Patient Details  Name: Tina Duarte MRN: [id number] Date of Birth: Apr 30, 1945 Referring Provider:  Minna Merritts, MD  Below are your personal goals for exercise, nutrition, and risk factors. Our goal is to help you stay on track towards obtaining and maintaining these goals. We will be discussing your progress on these goals with you throughout the program.  Initial Exercise Prescription: Initial Exercise Prescription - 11/19/18 1500      Date of Initial Exercise RX and Referring Provider   Date  11/19/18    Referring Provider  Gollan      Treadmill   MPH  0.5    Grade  0    Minutes  15   walk for 60 sec then rest   METs  1      Recumbant Bike   Level  1    RPM  60    Minutes  15    METs  1      NuStep   Level  1    SPM  80    Minutes  15    METs  1      T5 Nustep   Level  1    SPM  80    Minutes  15    METs  1      Biostep-RELP   Level  1    SPM  50    Minutes  15    METs  1      Prescription Details   Frequency (times per week)  3    Duration  Progress to 30 minutes of continuous aerobic without signs/symptoms of physical distress      Intensity   THRR 40-80% of Max Heartrate  102-130    Ratings of Perceived Exertion  11-15    Perceived Dyspnea  0-4      Resistance Training   Training Prescription  Yes    Weight  3 lb    Reps  10-15       Exercise Goals: Frequency: Be able to perform aerobic exercise two to three times per week in program working toward 2-5 days per week of home exercise.  Intensity: Work with a perceived exertion of 11 (fairly light) - 15 (hard) while following your exercise prescription.  We will make changes to your prescription with you as you progress through the program.   Duration: Be able to do 30 to 45 minutes of continuous aerobic exercise in addition to a 5 minute warm-up and a 5 minute cool-down routine.   Nutrition Goals: Your personal nutrition goals will be established when you do your  nutrition analysis with the dietician.  The following are general nutrition guidelines to follow: Cholesterol < 200mg /day Sodium < 1500mg /day Fiber: Women over 50 yrs - 21 grams per day  Personal Goals: Personal Goals and Risk Factors at Admission - 11/19/18 1611      Core Components/Risk Factors/Patient Goals on Admission    Weight Management  Yes;Obesity    Intervention  Weight Management/Obesity: Establish reasonable short term and long term weight goals.;Weight Management: Provide education and appropriate resources to help participant work on and attain dietary goals.;Weight Management: Develop a combined nutrition and exercise program designed to reach desired caloric intake, while maintaining appropriate intake of nutrient and fiber, sodium and fats, and appropriate energy expenditure required for the weight goal.;Obesity: Provide education and appropriate resources to help participant work on and attain dietary goals.    Admit Weight  240  lb 9.6 oz (109.1 kg)    Expected Outcomes  Short Term: Continue to assess and modify interventions until short term weight is achieved;Long Term: Adherence to nutrition and physical activity/exercise program aimed toward attainment of established weight goal;Weight Loss: Understanding of general recommendations for a balanced deficit meal plan, which promotes 1-2 lb weight loss per week and includes a negative energy balance of [phone] kcal/d;Understanding recommendations for meals to include 15-35% energy as protein, 25-35% energy from fat, 35-60% energy from carbohydrates, less than 200mg  of dietary cholesterol, 20-35 gm of total fiber daily;Understanding of distribution of calorie intake throughout the day with the consumption of 4-5 meals/snacks    Improve shortness of breath with ADL's  Yes    Intervention  Provide education, individualized exercise plan and daily activity instruction to help decrease symptoms of SOB with activities of daily living.     Expected Outcomes  Short Term: Improve cardiorespiratory fitness to achieve a reduction of symptoms when performing ADLs;Long Term: Be able to perform more ADLs without symptoms or delay the onset of symptoms    Intervention  Provide education on lifestyle modifcations including regular physical activity/exercise, weight management, moderate sodium restriction and increased consumption of fresh fruit, vegetables, and low fat dairy, alcohol moderation, and smoking cessation.;Monitor prescription use compliance.    Expected Outcomes  Long Term: Maintenance of blood pressure at goal levels.;Short Term: Continued assessment and intervention until BP is < 140/68mm HG in hypertensive participants. < 130/30mm HG in hypertensive participants with diabetes, heart failure or chronic kidney disease.    Intervention  Provide education and support for participant on nutrition & aerobic/resistive exercise along with prescribed medications to achieve LDL 70mg , HDL >40mg .    Expected Outcomes  Short Term: Participant states understanding of desired cholesterol values and is compliant with medications prescribed. Participant is following exercise prescription and nutrition guidelines.;Long Term: Cholesterol controlled with medications as prescribed, with individualized exercise RX and with personalized nutrition plan. Value goals: LDL < 70mg , HDL > 40 mg.       Tobacco Use Initial Evaluation: Social History   Tobacco Use  Smoking Status Never Smoker  Smokeless Tobacco Never Used    Exercise Goals and Review: Exercise Goals    Row Name 11/19/18 1602             Exercise Goals   Increase Physical Activity  Yes       Intervention  Provide advice, education, support and counseling about physical activity/exercise needs.;Develop an individualized exercise prescription for aerobic and resistive training based on initial evaluation findings, risk stratification, comorbidities and participant's personal goals.        Expected Outcomes  Short Term: Attend rehab on a regular basis to increase amount of physical activity.;Long Term: Add in home exercise to make exercise part of routine and to increase amount of physical activity.;Long Term: Exercising regularly at least 3-5 days a week.       Increase Strength and Stamina  Yes       Intervention  Provide advice, education, support and counseling about physical activity/exercise needs.;Develop an individualized exercise prescription for aerobic and resistive training based on initial evaluation findings, risk stratification, comorbidities and participant's personal goals.       Expected Outcomes  Short Term: Increase workloads from initial exercise prescription for resistance, speed, and METs.;Short Term: Perform resistance training exercises routinely during rehab and add in resistance training at home;Long Term: Improve cardiorespiratory fitness, muscular endurance and strength as measured by increased METs and functional  capacity (6MWT)       Able to understand and use rate of perceived exertion (RPE) scale  Yes       Intervention  Provide education and explanation on how to use RPE scale       Expected Outcomes  Short Term: Able to use RPE daily in rehab to express subjective intensity level;Long Term:  Able to use RPE to guide intensity level when exercising independently       Able to understand and use Dyspnea scale  Yes       Intervention  Provide education and explanation on how to use Dyspnea scale       Expected Outcomes  Short Term: Able to use Dyspnea scale daily in rehab to express subjective sense of shortness of breath during exertion;Long Term: Able to use Dyspnea scale to guide intensity level when exercising independently       Knowledge and understanding of Target Heart Rate Range (THRR)  Yes       Intervention  Provide education and explanation of THRR including how the numbers were predicted and where they are located for reference        Expected Outcomes  Short Term: Able to state/look up THRR;Short Term: Able to use daily as guideline for intensity in rehab;Long Term: Able to use THRR to govern intensity when exercising independently       Able to check pulse independently  Yes       Intervention  Provide education and demonstration on how to check pulse in carotid and radial arteries.;Review the importance of being able to check your own pulse for safety during independent exercise       Expected Outcomes  Short Term: Able to explain why pulse checking is important during independent exercise;Long Term: Able to check pulse independently and accurately       Understanding of Exercise Prescription  Yes       Intervention  Provide education, explanation, and written materials on patient's individual exercise prescription       Expected Outcomes  Short Term: Able to explain program exercise prescription;Long Term: Able to explain home exercise prescription to exercise independently          Copy of goals given to participant.

## 2018-11-19 NOTE — Progress Notes (Signed)
Pulmonary Individual Treatment Plan  Patient Details  Name: Tina Duarte MRN: [id number] Date of Birth: Feb 06, 1945 Referring Provider:     Pulmonary Rehab from 11/19/2018 in [facility] Cardiac and Pulmonary Rehab  Referring Provider  Gollan      Initial Encounter Date:    Pulmonary Rehab from 11/19/2018 in [facility] Cardiac and Pulmonary Rehab  Date  11/19/18      Visit Diagnosis: Chronic obstructive pulmonary disease, unspecified COPD type ([place])  Patient's Home Medications on Admission:  Current Outpatient Medications:  .  acetaminophen (TYLENOL) 650 MG CR tablet, Take 650 mg by mouth every 8 (eight) hours as needed for pain., Disp: , Rfl:  .  albuterol (ACCUNEB) 0.63 MG/3ML nebulizer solution, Take 3 mLs (0.63 mg total) by nebulization every 6 (six) hours as needed for wheezing., Disp: 75 mL, Rfl: 12 .  citalopram (CELEXA) 20 MG tablet, Take 1 tablet (20 mg total) by mouth daily., Disp: 30 tablet, Rfl: 2 .  Coenzyme Q10 (COQ10) 30 MG CAPS, Take by mouth., Disp: , Rfl:  .  losartan (COZAAR) 50 MG tablet, TAKE 1 TABLET BY MOUTH ONCE A DAY, Disp: 90 tablet, Rfl: 1 .  PROAIR HFA 108 (90 Base) MCG/ACT inhaler, INL 2 PFS ITL Q 6 H PRF WHZ OR SOB, Disp: , Rfl:  .  rosuvastatin (CRESTOR) 5 MG tablet, Take 1 tablet (5 mg total) by mouth daily., Disp: 90 tablet, Rfl: 1 .  warfarin (COUMADIN) 5 MG tablet, Take 5 mg on Wednesday and Sunday, 2.5 mg all other days., Disp: 30 tablet, Rfl: 1  Past Medical History: Past Medical History:  Diagnosis Date  . Allergy   . Asthma   . Chicken pox   . History of blood clots   . Hypercholesterolemia   . Hypertension     Tobacco Use: Social History   Tobacco Use  Smoking Status Never Smoker  Smokeless Tobacco Never Used    Labs: Recent Review Flowsheet Data    Labs for ITP Cardiac and Pulmonary Rehab Latest Ref Rng & Units 12/19/2016 04/10/2017 11/13/2017 07/01/2018 07/09/2018   Cholestrol 0 - 200 mg/dL 159 156 248(H) - 187   LDLCALC 0 - 99 mg/dL 90 72 -  - 104(H)   LDLDIRECT mg/dL - - 174.0 - -   HDL >39.00 mg/dL 46.80 52.00 55.50 - 57.30   Trlycerides 0.0 - 149.0 mg/dL 112.0 162.0(H) 245.0(H) - 127.0   Hemoglobin A1c 4.6 - 6.5 % 5.9 6.0 6.0 6.1 -       Pulmonary Assessment Scores: Pulmonary Assessment Scores    Row Name 11/19/18 1336         ADL UCSD   ADL Phase  Entry     SOB Score total  86     Rest  0     Walk  3     Stairs  5     Bath  5     Dress  5     Shop  5       CAT Score   CAT Score  21       mMRC Score   mMRC Score  4        UCSD: Fontanella-administered rating of dyspnea associated with activities of daily living (ADLs) 6-point scale (0 = "not at all" to 5 = "maximal or unable to do because of breathlessness")  Scoring Scores range from 0 to 120.  Minimally important difference is 5 units  CAT: CAT can identify the health impairment of  COPD patients and is better correlated with disease progression.  CAT has a scoring range of zero to 40. The CAT score is classified into four groups of low (less than 10), medium (10 - 20), high (21-30) and very high (31-40) based on the impact level of disease on health status. A CAT score over 10 suggests significant symptoms.  A worsening CAT score could be explained by an exacerbation, poor medication adherence, poor inhaler technique, or progression of COPD or comorbid conditions.  CAT MCID is 2 points  mMRC: mMRC (Modified Medical Research Council) Dyspnea Scale is used to assess the degree of baseline functional disability in patients of respiratory disease due to dyspnea. No minimal important difference is established. A decrease in score of 1 point or greater is considered a positive change.   Pulmonary Function Assessment: Pulmonary Function Assessment - 11/19/18 1425      Breath   Bilateral Breath Sounds  Clear;Decreased    Shortness of Breath  No;Limiting activity       Exercise Target Goals: Exercise Program Goal: Individual exercise prescription set using  results from initial 6 min walk test and THRR while considering  patient's activity barriers and safety.   Exercise Prescription Goal: Initial exercise prescription builds to 30-45 minutes a day of aerobic activity, 2-3 days per week.  Home exercise guidelines will be given to patient during program as part of exercise prescription that the participant will acknowledge.  Activity Barriers & Risk Stratification:   6 Minute Walk: 6 Minute Walk    Row Name 11/19/18 1557         6 Minute Walk   Phase  Initial     Distance  200 feet     Walk Time  2.25 minutes     # of Rest Breaks  1     MPH  1.01     METS  1     RPE  13     Perceived Dyspnea   4     VO2 Peak  0.5     Symptoms  Yes (comment)     Comments  hip and knee pain     Resting HR  74 bpm     Resting BP  128/70     Resting Oxygen Saturation   93 %     Exercise Oxygen Saturation  during 6 min walk  92 %     Max Ex. HR  113 bpm     Max Ex. BP  142/76     2 Minute Post BP  124/76       Interval HR   1 Minute HR  106     2 Minute HR  113     3 Minute HR  94     4 Minute HR  96     Interval Heart Rate?  Yes       Interval Oxygen   Interval Oxygen?  Yes     Baseline Oxygen Saturation %  93 %     1 Minute Oxygen Saturation %  92 %     2 Minute Oxygen Saturation %  92 %     3 Minute Oxygen Saturation %  93 %     4 Minute Oxygen Saturation %  93 %       Oxygen Initial Assessment: Oxygen Initial Assessment - 11/19/18 1335      Home Oxygen   Home Oxygen Device  Home Concentrator;E-Tanks    Sleep Oxygen Prescription  Continuous    Liters per minute  1    Home Exercise Oxygen Prescription  None    Home at Rest Exercise Oxygen Prescription  None    Compliance with Home Oxygen Use  Yes      Initial 6 min Walk   Oxygen Used  None      Program Oxygen Prescription   Program Oxygen Prescription  None      Intervention   Short Term Goals  To learn and demonstrate proper use of respiratory medications;To learn and  demonstrate proper pursed lip breathing techniques or other breathing techniques.;To learn and understand importance of maintaining oxygen saturations>88%;To learn and understand importance of monitoring SPO2 with pulse oximeter and demonstrate accurate use of the pulse oximeter.;To learn and exhibit compliance with exercise, home and travel O2 prescription    Long  Term Goals  Demonstrates proper use of MDI's;Compliance with respiratory medication;Exhibits proper breathing techniques, such as pursed lip breathing or other method taught during program session;Maintenance of O2 saturations>88%;Verbalizes importance of monitoring SPO2 with pulse oximeter and return demonstration;Exhibits compliance with exercise, home and travel O2 prescription       Oxygen Re-Evaluation:   Oxygen Discharge (Final Oxygen Re-Evaluation):   Initial Exercise Prescription: Initial Exercise Prescription - 11/19/18 1500      Date of Initial Exercise RX and Referring Provider   Date  11/19/18    Referring Provider  Gollan      Treadmill   MPH  0.5    Grade  0    Minutes  15   walk for 60 sec then rest   METs  1      Recumbant Bike   Level  1    RPM  60    Minutes  15    METs  1      NuStep   Level  1    SPM  80    Minutes  15    METs  1      T5 Nustep   Level  1    SPM  80    Minutes  15    METs  1      Biostep-RELP   Level  1    SPM  50    Minutes  15    METs  1      Prescription Details   Frequency (times per week)  3    Duration  Progress to 30 minutes of continuous aerobic without signs/symptoms of physical distress      Intensity   THRR 40-80% of Max Heartrate  102-130    Ratings of Perceived Exertion  11-15    Perceived Dyspnea  0-4      Resistance Training   Training Prescription  Yes    Weight  3 lb    Reps  10-15       Perform Capillary Blood Glucose checks as needed.  Exercise Prescription Changes: Exercise Prescription Changes    Row Name 11/19/18 1600              Response to Exercise   Blood Pressure (Admit)  128/70       Blood Pressure (Exercise)  142/76       Blood Pressure (Exit)  124/76       Heart Rate (Admit)  74 bpm       Heart Rate (Exercise)  113 bpm       Heart Rate (Exit)  96 bpm  Oxygen Saturation (Admit)  93 %       Oxygen Saturation (Exercise)  92 %       Oxygen Saturation (Exit)  93 %       Rating of Perceived Exertion (Exercise)  13       Perceived Dyspnea (Exercise)  4       Symptoms  hip and knee pain          Exercise Comments:   Exercise Goals and Review: Exercise Goals    Row Name 11/19/18 1602             Exercise Goals   Increase Physical Activity  Yes       Intervention  Provide advice, education, support and counseling about physical activity/exercise needs.;Develop an individualized exercise prescription for aerobic and resistive training based on initial evaluation findings, risk stratification, comorbidities and participant's personal goals.       Expected Outcomes  Short Term: Attend rehab on a regular basis to increase amount of physical activity.;Long Term: Add in home exercise to make exercise part of routine and to increase amount of physical activity.;Long Term: Exercising regularly at least 3-5 days a week.       Increase Strength and Stamina  Yes       Intervention  Provide advice, education, support and counseling about physical activity/exercise needs.;Develop an individualized exercise prescription for aerobic and resistive training based on initial evaluation findings, risk stratification, comorbidities and participant's personal goals.       Expected Outcomes  Short Term: Increase workloads from initial exercise prescription for resistance, speed, and METs.;Short Term: Perform resistance training exercises routinely during rehab and add in resistance training at home;Long Term: Improve cardiorespiratory fitness, muscular endurance and strength as measured by increased METs and functional  capacity (6MWT)       Able to understand and use rate of perceived exertion (RPE) scale  Yes       Intervention  Provide education and explanation on how to use RPE scale       Expected Outcomes  Short Term: Able to use RPE daily in rehab to express subjective intensity level;Long Term:  Able to use RPE to guide intensity level when exercising independently       Able to understand and use Dyspnea scale  Yes       Intervention  Provide education and explanation on how to use Dyspnea scale       Expected Outcomes  Short Term: Able to use Dyspnea scale daily in rehab to express subjective sense of shortness of breath during exertion;Long Term: Able to use Dyspnea scale to guide intensity level when exercising independently       Knowledge and understanding of Target Heart Rate Range (THRR)  Yes       Intervention  Provide education and explanation of THRR including how the numbers were predicted and where they are located for reference       Expected Outcomes  Short Term: Able to state/look up THRR;Short Term: Able to use daily as guideline for intensity in rehab;Long Term: Able to use THRR to govern intensity when exercising independently       Able to check pulse independently  Yes       Intervention  Provide education and demonstration on how to check pulse in carotid and radial arteries.;Review the importance of being able to check your own pulse for safety during independent exercise       Expected Outcomes  Short Term: Able  to explain why pulse checking is important during independent exercise;Long Term: Able to check pulse independently and accurately       Understanding of Exercise Prescription  Yes       Intervention  Provide education, explanation, and written materials on patient's individual exercise prescription       Expected Outcomes  Short Term: Able to explain program exercise prescription;Long Term: Able to explain home exercise prescription to exercise independently           Exercise Goals Re-Evaluation :   Discharge Exercise Prescription (Final Exercise Prescription Changes): Exercise Prescription Changes - 11/19/18 1600      Response to Exercise   Blood Pressure (Admit)  128/70    Blood Pressure (Exercise)  142/76    Blood Pressure (Exit)  124/76    Heart Rate (Admit)  74 bpm    Heart Rate (Exercise)  113 bpm    Heart Rate (Exit)  96 bpm    Oxygen Saturation (Admit)  93 %    Oxygen Saturation (Exercise)  92 %    Oxygen Saturation (Exit)  93 %    Rating of Perceived Exertion (Exercise)  13    Perceived Dyspnea (Exercise)  4    Symptoms  hip and knee pain       Nutrition:  Target Goals: Understanding of nutrition guidelines, daily intake of sodium <1536m, cholesterol <2013m calories 30% from fat and 7% or less from saturated fats, daily to have 5 or more servings of fruits and vegetables.  Biometrics:    Nutrition Therapy Plan and Nutrition Goals:   Nutrition Assessments: Nutrition Assessments - 11/19/18 1435      MEDFICTS Scores   Pre Score  29       Nutrition Goals Re-Evaluation:   Nutrition Goals Discharge (Final Nutrition Goals Re-Evaluation):   Psychosocial: Target Goals: Acknowledge presence or absence of significant depression and/or stress, maximize coping skills, provide positive support system. Participant is able to verbalize types and ability to use techniques and skills needed for reducing stress and depression.   Initial Review & Psychosocial Screening: Initial Psych Review & Screening - 11/19/18 1426      Initial Review   Current issues with  History of Depression;Current Psychotropic Meds;Current Depression      Family Dynamics   Good Support System?  Yes    Comments  She can look to her two children for support.      Barriers   Psychosocial barriers to participate in program  The patient should benefit from training in stress management and relaxation.      Screening Interventions   Interventions   Provide feedback about the scores to participant;To provide support and resources with identified psychosocial needs;Encouraged to exercise    Expected Outcomes  Short Term goal: Utilizing psychosocial counselor, staff and physician to assist with identification of specific Stressors or current issues interfering with healing process. Setting desired goal for each stressor or current issue identified.;Long Term Goal: Stressors or current issues are controlled or eliminated.;Short Term goal: Identification and review with participant of any Quality of Life or Depression concerns found by scoring the questionnaire.;Long Term goal: The participant improves quality of Life and PHQ9 Scores as seen by post scores and/or verbalization of changes       Quality of Life Scores:  Scores of 19 and below usually indicate a poorer quality of life in these areas.  A difference of  2-3 points is a clinically meaningful difference.  A difference of 2-3  points in the total score of the Quality of Life Index has been associated with significant improvement in overall quality of life, Clipper-image, physical symptoms, and general health in studies assessing change in quality of life.  PHQ-9: Recent Review Flowsheet Data    Depression screen [facility] 2/9 11/19/2018 08/19/2018 08/14/2017 12/21/2015 09/15/2014   Decreased Interest 1 0 0 0 0   Down, Depressed, Hopeless 0 0 0 0 0   PHQ - 2 Score 1 0 0 0 0   Altered sleeping 1 - - - -   Tired, decreased energy 1 - - - -   Change in appetite 0 - - - -   Feeling bad or failure about yourself  3 - - - -   Trouble concentrating 0 - - - -   Moving slowly or fidgety/restless 0 - - - -   Suicidal thoughts 0 - - - -   PHQ-9 Score 6 - - - -   Difficult doing work/chores Very difficult - - - -     Interpretation of Total Score  Total Score Depression Severity:  1-4 = Minimal depression, 5-9 = Mild depression, 10-14 = Moderate depression, 15-19 = Moderately severe depression, 20-27 =  Severe depression   Psychosocial Evaluation and Intervention:   Psychosocial Re-Evaluation:   Psychosocial Discharge (Final Psychosocial Re-Evaluation):   Education: Education Goals: Education classes will be provided on a weekly basis, covering required topics. Participant will state understanding/return demonstration of topics presented.  Learning Barriers/Preferences: Learning Barriers/Preferences - 11/19/18 1427      Learning Barriers/Preferences   Learning Barriers  None    Learning Preferences  None       Education Topics:  Initial Evaluation Education: - Verbal, written and demonstration of respiratory meds, oximetry and breathing techniques. Instruction on use of nebulizers and MDIs and importance of monitoring MDI activations.   Pulmonary Rehab from 11/19/2018 in [facility] Cardiac and Pulmonary Rehab  Date  11/19/18  Educator  [facility]  Instruction Review Code  1- Verbalizes Understanding      General Nutrition Guidelines/Fats and Fiber: -Group instruction provided by verbal, written material, models and posters to present the general guidelines for heart healthy nutrition. Gives an explanation and review of dietary fats and fiber.   Controlling Sodium/Reading Food Labels: -Group verbal and written material supporting the discussion of sodium use in heart healthy nutrition. Review and explanation with models, verbal and written materials for utilization of the food label.   Exercise Physiology & General Exercise Guidelines: - Group verbal and written instruction with models to review the exercise physiology of the cardiovascular system and associated critical values. Provides general exercise guidelines with specific guidelines to those with heart or lung disease.    Aerobic Exercise & Resistance Training: - Gives group verbal and written instruction on the various components of exercise. Focuses on aerobic and resistive training programs and the benefits of this training  and how to safely progress through these programs.   Flexibility, Balance, Mind/Body Relaxation: Provides group verbal/written instruction on the benefits of flexibility and balance training, including mind/body exercise modes such as yoga, pilates and tai chi.  Demonstration and skill practice provided.   Stress and Anxiety: - Provides group verbal and written instruction about the health risks of elevated stress and causes of high stress.  Discuss the correlation between heart/lung disease and anxiety and treatment options. Review healthy ways to manage with stress and anxiety.   Depression: - Provides group verbal and written instruction on the  correlation between heart/lung disease and depressed mood, treatment options, and the stigmas associated with seeking treatment.   Exercise & Equipment Safety: - Individual verbal instruction and demonstration of equipment use and safety with use of the equipment.   Pulmonary Rehab from 11/19/2018 in [facility] Cardiac and Pulmonary Rehab  Date  11/19/18  Educator  [facility]  Instruction Review Code  1- Verbalizes Understanding      Infection Prevention: - Provides verbal and written material to individual with discussion of infection control including proper hand washing and proper equipment cleaning during exercise session.   Pulmonary Rehab from 11/19/2018 in [facility] Cardiac and Pulmonary Rehab  Date  11/19/18  Educator  [facility]  Instruction Review Code  1- Verbalizes Understanding      Falls Prevention: - Provides verbal and written material to individual with discussion of falls prevention and safety.   Pulmonary Rehab from 11/19/2018 in [facility] Cardiac and Pulmonary Rehab  Date  11/19/18  Educator  [facility]  Instruction Review Code  1- Verbalizes Understanding      Diabetes: - Individual verbal and written instruction to review signs/symptoms of diabetes, desired ranges of glucose level fasting, after meals and with exercise. Advice that pre and post  exercise glucose checks will be done for 3 sessions at entry of program.   Chronic Lung Diseases: - Group verbal and written instruction to review updates, respiratory medications, advancements in procedures and treatments. Discuss use of supplemental oxygen including available portable oxygen systems, continuous and intermittent flow rates, concentrators, personal use and safety guidelines. Review proper use of inhaler and spacers. Provide informative websites for [place]-education.    Energy Conservation: - Provide group verbal and written instruction for methods to conserve energy, plan and organize activities. Instruct on pacing techniques, use of adaptive equipment and posture/positioning to relieve shortness of breath.   Triggers and Exacerbations: - Group verbal and written instruction to review types of environmental triggers and ways to prevent exacerbations. Discuss weather changes, air quality and the benefits of nasal washing. Review warning signs and symptoms to help prevent infections. Discuss techniques for effective airway clearance, coughing, and vibrations.   AED/CPR: - Group verbal and written instruction with the use of models to demonstrate the basic use of the AED with the basic ABC's of resuscitation.   Anatomy and Physiology of the Lungs: - Group verbal and written instruction with the use of models to provide basic lung anatomy and physiology related to function, structure and complications of lung disease.   Anatomy & Physiology of the Heart: - Group verbal and written instruction and models provide basic cardiac anatomy and physiology, with the coronary electrical and arterial systems. Review of Valvular disease and Heart Failure   Cardiac Medications: - Group verbal and written instruction to review commonly prescribed medications for heart disease. Reviews the medication, class of the drug, and side effects.   Know Your Numbers and Risk Factors: -Group verbal  and written instruction about important numbers in your health.  Discussion of what are risk factors and how they play a role in the disease process.  Review of Cholesterol, Blood Pressure, Diabetes, and BMI and the role they play in your overall health.   Sleep Hygiene: -Provides group verbal and written instruction about how sleep can affect your health.  Define sleep hygiene, discuss sleep cycles and impact of sleep habits. Review good sleep hygiene tips.    Other: -Provides group and verbal instruction on various topics (see comments)    Knowledge Questionnaire Score: Knowledge  Questionnaire Score - 11/19/18 1414      Knowledge Questionnaire Score   Pre Score  13/18   Reviewed with patient       Core Components/Risk Factors/Patient Goals at Admission: Personal Goals and Risk Factors at Admission - 11/19/18 1428      Core Components/Risk Factors/Patient Goals on Admission    Weight Management  Yes;Weight Loss    Intervention  Weight Management/Obesity: Establish reasonable short term and long term weight goals.;Weight Management: Provide education and appropriate resources to help participant work on and attain dietary goals.;Weight Management: Develop a combined nutrition and exercise program designed to reach desired caloric intake, while maintaining appropriate intake of nutrient and fiber, sodium and fats, and appropriate energy expenditure required for the weight goal.;Obesity: Provide education and appropriate resources to help participant work on and attain dietary goals.    Admit Weight  246 lb (111.6 kg)    Goal Weight: Short Term  241 lb (109.3 kg)    Goal Weight: Long Term  175 lb (79.4 kg)    Expected Outcomes  Short Term: Continue to assess and modify interventions until short term weight is achieved;Long Term: Adherence to nutrition and physical activity/exercise program aimed toward attainment of established weight goal;Weight Loss: Understanding of general  recommendations for a balanced deficit meal plan, which promotes 1-2 lb weight loss per week and includes a negative energy balance of [phone] kcal/d;Understanding recommendations for meals to include 15-35% energy as protein, 25-35% energy from fat, 35-60% energy from carbohydrates, less than 28m of dietary cholesterol, 20-35 gm of total fiber daily;Understanding of distribution of calorie intake throughout the day with the consumption of 4-5 meals/snacks    Improve shortness of breath with ADL's  Yes    Intervention  Provide education, individualized exercise plan and daily activity instruction to help decrease symptoms of SOB with activities of daily living.    Expected Outcomes  Short Term: Improve cardiorespiratory fitness to achieve a reduction of symptoms when performing ADLs;Long Term: Be able to perform more ADLs without symptoms or delay the onset of symptoms    Hypertension  Yes    Intervention  Provide education on lifestyle modifcations including regular physical activity/exercise, weight management, moderate sodium restriction and increased consumption of fresh fruit, vegetables, and low fat dairy, alcohol moderation, and smoking cessation.;Monitor prescription use compliance.    Expected Outcomes  Long Term: Maintenance of blood pressure at goal levels.;Short Term: Continued assessment and intervention until BP is < 140/988mHG in hypertensive participants. < 130/8067mG in hypertensive participants with diabetes, heart failure or chronic kidney disease.    Lipids  Yes    Intervention  Provide education and support for participant on nutrition & aerobic/resistive exercise along with prescribed medications to achieve LDL <20m41mDL >40mg29m Expected Outcomes  Short Term: Participant states understanding of desired cholesterol values and is compliant with medications prescribed. Participant is following exercise prescription and nutrition guidelines.;Long Term: Cholesterol controlled with  medications as prescribed, with individualized exercise RX and with personalized nutrition plan. Value goals: LDL < 20mg,80m > 40 mg.       Core Components/Risk Factors/Patient Goals Review:    Core Components/Risk Factors/Patient Goals at Discharge (Final Review):    ITP Comments:   Comments: initial ITP

## 2018-11-20 ENCOUNTER — Encounter: Payer: Self-pay | Admitting: Internal Medicine

## 2018-11-20 ENCOUNTER — Encounter: Payer: Medicare Other | Admitting: *Deleted

## 2018-11-20 DIAGNOSIS — J449 Chronic obstructive pulmonary disease, unspecified: Secondary | ICD-10-CM | POA: Diagnosis not present

## 2018-11-20 DIAGNOSIS — R0602 Shortness of breath: Secondary | ICD-10-CM | POA: Diagnosis not present

## 2018-11-20 LAB — BASIC METABOLIC PANEL
BUN: 11 mg/dL (ref 6–23)
CO2: 29 mEq/L (ref 19–32)
Calcium: 9 mg/dL (ref 8.4–10.5)
Chloride: 103 mEq/L (ref 96–112)
Creatinine, Ser: 1.08 mg/dL (ref 0.40–1.20)
GFR: 49.44 mL/min — ABNORMAL LOW (ref >60.00)
Glucose, Bld: 88 mg/dL (ref 70–99)
Potassium: 4.4 mEq/L (ref 3.5–5.1)
Sodium: 140 mEq/L (ref 135–145)

## 2018-11-20 LAB — HEPATIC FUNCTION PANEL
ALT: 20 U/L (ref 0–35)
AST: 29 U/L (ref 0–37)
Albumin: 4.4 g/dL (ref 3.5–5.2)
Alkaline Phosphatase: 63 U/L (ref 39–117)
Bilirubin, Direct: 0.1 mg/dL (ref 0.0–0.3)
Total Bilirubin: 0.8 mg/dL (ref 0.2–1.2)
Total Protein: 7 g/dL (ref 6.0–8.3)

## 2018-11-20 LAB — LIPID PANEL
Cholesterol: 162 mg/dL (ref 0–200)
HDL: 56.1 mg/dL (ref >39.00)
LDL Cholesterol: 77 mg/dL (ref 0–99)
NonHDL: 105.66
Total CHOL/HDL Ratio: 3
Triglycerides: 144 mg/dL (ref 0.0–149.0)
VLDL: 28.8 mg/dL (ref 0.0–40.0)

## 2018-11-20 LAB — PROTIME-INR
INR: 3.9 — ABNORMAL HIGH
Prothrombin Time: 37.9 s — ABNORMAL HIGH (ref 9.0–11.5)

## 2018-11-20 LAB — HEMOGLOBIN A1C: Hgb A1c MFr Bld: 5.8 % (ref 4.6–6.5)

## 2018-11-20 NOTE — Progress Notes (Signed)
Daily Session Note  Patient Details  Name: Tina Duarte MRN: [id number] Date of Birth: 13-Jun-1945 Referring Provider:     Pulmonary Rehab from 11/19/2018 in [facility]  Referring Provider  [place]      Encounter Date: 11/20/2018  Check In: Session Check In - 11/20/18 0755      Check-In   Supervising physician immediately available to respond to emergencies  See telemetry face sheet for immediately available ER MD    Location  [facility]-Cardiac & Pulmonary Rehab    Staff Present  Alberteen Sam, MA, RCEP, CCRP, Kathyrn Drown, RN BSN;[place] [employer]    Virtual Visit  No    Medication changes reported      No    Fall or balance concerns reported     No    Tobacco Cessation  No Change    Warm-up and Cool-down  Performed on first and last piece of equipment    Resistance Training Performed  Yes    VAD Patient?  No    PAD/SET Patient?  No      Pain Assessment   Currently in Pain?  No/denies          Social History   Tobacco Use  Smoking Status Never Smoker  Smokeless Tobacco Never Used    Goals Met:  Proper associated with RPD/PD & O2 Sat Using PLB without cueing & demonstrates good technique Exercise tolerated well Queuing for purse lip breathing Strength training completed today  Goals Unmet:  Not Applicable  Comments: First full day of exercise!  Patient was oriented to gym and equipment including functions, settings, policies, and procedures.  Patient's individual exercise prescription and treatment plan were reviewed.  All starting workloads were established based on the results of the 6 minute walk test done at initial orientation visit.  The plan for exercise progression was also introduced and progression will be customized based on patient's performance and goals.    Dr. Emily Filbert is Medical Director for [place] and [facility].

## 2018-11-21 ENCOUNTER — Telehealth: Payer: Self-pay

## 2018-11-21 NOTE — Telephone Encounter (Signed)
Lm asking patient to call back in regards to [employer]. I wanted to see if I could get her into the office to see Lauren so her knee could be evaluated.

## 2018-11-22 ENCOUNTER — Other Ambulatory Visit: Payer: Self-pay

## 2018-11-22 ENCOUNTER — Ambulatory Visit (INDEPENDENT_AMBULATORY_CARE_PROVIDER_SITE_OTHER): Payer: Medicare Other | Admitting: Family Medicine

## 2018-11-22 ENCOUNTER — Encounter: Payer: Self-pay | Admitting: Family Medicine

## 2018-11-22 VITALS — BP 142/82 | HR 101 | Temp 97.2°F | Wt 237.4 lb

## 2018-11-22 DIAGNOSIS — T148XXA Other injury of unspecified body region, initial encounter: Secondary | ICD-10-CM | POA: Diagnosis not present

## 2018-11-22 DIAGNOSIS — M25562 Pain in left knee: Secondary | ICD-10-CM | POA: Diagnosis not present

## 2018-11-22 DIAGNOSIS — W19XXXA Unspecified fall, initial encounter: Secondary | ICD-10-CM | POA: Diagnosis not present

## 2018-11-22 MED ORDER — CITALOPRAM HYDROBROMIDE 20 MG PO TABS: 20.0000 mg | ORAL_TABLET | Freq: Every day | ORAL | 1 refills | Status: DC

## 2018-11-22 MED ORDER — ROSUVASTATIN CALCIUM 5 MG PO TABS: 5.0000 mg | ORAL_TABLET | Freq: Every day | ORAL | 1 refills | Status: DC

## 2018-11-22 NOTE — Patient Instructions (Signed)
Contusion A contusion is a deep bruise. This is a result of an injury that causes bleeding under the skin. Symptoms of bruising include pain, swelling, and discolored skin. The skin may turn blue, purple, or yellow. Follow these instructions at home: Managing pain, stiffness, and swelling You may use RICE. This stands for:  Resting.  Icing.  Compression, or putting pressure.  Elevating, or raising the injured area. To follow this method, do these actions:  Rest the injured area.  If told, put ice on the injured area. ? Put ice in a plastic bag. ? Place a towel between your skin and the bag. ? Leave the ice on for 20 minutes, 2-3 times per day.  If told, put light pressure (compression) on the injured area using an elastic bandage. Make sure the bandage is not too tight. If the area tingles or becomes numb, remove it and put it back on as told by your doctor.  If possible, raise (elevate) the injured area above the level of your heart while you are sitting or lying down.    YOU CAN ALSO USE HEATING PAD; this can help reduce bruise area  General instructions  Take over-the-counter and prescription medicines only as told by your doctor.  Keep all follow-up visits as told by your doctor. This is important. Contact a doctor if:  Your symptoms do not get better after several days of treatment.  Your symptoms get worse.  You have trouble moving the injured area. Get help right away if:  You have very bad pain.  You have a loss of feeling (numbness) in a hand or foot.  Your hand or foot turns pale or cold. Summary  A contusion is a deep bruise. This is a result of an injury that causes bleeding under the skin.  Symptoms of bruising include pain, swelling, and discolored skin. The skin may turn blue, purple, or yellow.  This condition is treated with rest, ice, compression, and elevation. This is also called RICE. You may be given over-the-counter medicines for pain.   Contact a doctor if you do not feel better, or you feel worse. Get help right away if you have very bad pain, have lost feeling in a hand or foot, or the area turns pale or cold. This information is not intended to replace advice given to you by your health care provider. Make sure you discuss any questions you have with your health care provider. Document Released: 07/05/2007 Document Revised: 09/07/2017 Document Reviewed: 09/07/2017 Elsevier Patient Education  2020 [employer].

## 2018-11-22 NOTE — Progress Notes (Signed)
Subjective:    [name], female    DOB: [birth year], 73 y.o.   MRN: [id number]  HPI   Presents to clinic due to knee pain in the left leg/knee after fall 3 days ago.  Patient states she was coming around the side of her hair and lost her footing landing directly on her left knee.  Noticed large bruised area on left knee and below neck knee.  She is on Coumadin so bruises very easily.  Denies any issues with range of motion of the knee, did put ice on it the last 2 nights with some effect in helping to reduce pain.  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  HENT: Negative for congestion, ear pain, sinus pain and sore throat.   Eyes: Negative.   Respiratory: Negative for cough, shortness of breath and wheezing.   Cardiovascular: Negative for chest pain, palpitations and leg swelling.  Gastrointestinal: Negative for abdominal pain, diarrhea, nausea and  vomiting.  Genitourinary: Negative for dysuria, frequency and urgency.  Musculoskeletal: +knee pain Skin: Negative for color change, pallor and rash.  Neurological: Negative for syncope, light-headedness and headaches.  Psychiatric/Behavioral: The patient is not nervous/anxious.       Objective:   Physical Exam Vitals signs and nursing note reviewed.  Constitutional:      General: She is not in acute distress.    Appearance: She is not toxic-appearing.  HENT:     Head: Normocephalic and atraumatic.  Cardiovascular:     Rate and Rhythm: Regular rhythm.     Heart sounds: Normal heart sounds.  Pulmonary:     Effort: Pulmonary effort is normal. No respiratory distress.     Breath sounds: Normal breath sounds.  Musculoskeletal:     Left knee: She exhibits ecchymosis.     Right lower leg: No edema.     Left lower leg: No edema.       Legs:     Comments: Location of bruising indicated by red mark on diagram. ROM left knee intact.   Neurological:     Mental Status: She is alert.  Psychiatric:        Mood and Affect: Mood normal.        Behavior: Behavior normal.     Today's Vitals   11/22/18 1503  BP: (!) 142/82  Pulse: (!) 101  Temp: (!) 97.2 F (36.2 C)  TempSrc: Temporal  SpO2: 98%  Weight: 237 lb 6.4 oz (107.7 kg)   Body mass index is 40.75 kg/m.     Assessment & Plan:    Bruising  Fall, initial encounter  Acute pain of left knee  Range of motion of left knee is intact.  Tenderness along bruised area.  Patient declines x-ray in clinic, feels pain is mainly from the bruised area not in the knee joint itself.  Patient will keep leg elevated, use ice or heat and monitor for improvement bruising.  Advised if she changes her mind with the x-ray please let us know.  Advised that the bruise should resolve over the next 1 to 2 weeks.  She will otherwise keep all regular follow-ups with PCP as planned and return to clinic sooner if any issues arise.

## 2018-11-23 ENCOUNTER — Encounter: Payer: Self-pay | Admitting: Internal Medicine

## 2018-11-25 ENCOUNTER — Telehealth: Payer: Self-pay

## 2018-11-25 NOTE — Telephone Encounter (Signed)
Patient states that she fell and hurt her left knee. She is going to try to return on Monday for Rehab.

## 2018-11-26 ENCOUNTER — Encounter: Payer: Self-pay | Admitting: Internal Medicine

## 2018-11-26 ENCOUNTER — Other Ambulatory Visit: Payer: Medicare Other

## 2018-11-26 DIAGNOSIS — W19XXXA Unspecified fall, initial encounter: Secondary | ICD-10-CM

## 2018-11-27 MED ORDER — TRAMADOL HCL 50 MG PO TABS: 50.0000 mg | ORAL_TABLET | Freq: Three times a day (TID) | ORAL | 0 refills | Status: AC | PRN

## 2018-11-27 NOTE — Telephone Encounter (Signed)
Pt aware.

## 2018-11-27 NOTE — Telephone Encounter (Signed)
I sent in small supply of tramadol to use PRN  Also please encourage her to elevate leg with heating pad on bruise area to help bruise resolve

## 2018-12-02 ENCOUNTER — Encounter: Payer: Medicare Other | Attending: Cardiovascular Disease

## 2018-12-02 DIAGNOSIS — J449 Chronic obstructive pulmonary disease, unspecified: Secondary | ICD-10-CM | POA: Insufficient documentation

## 2018-12-02 DIAGNOSIS — R0602 Shortness of breath: Secondary | ICD-10-CM | POA: Insufficient documentation

## 2018-12-03 ENCOUNTER — Telehealth: Payer: Self-pay | Admitting: Cardiovascular Disease

## 2018-12-03 NOTE — Telephone Encounter (Signed)
-----   Message from Emily Filbert, RN sent at 12/03/2018  2:39 PM EST ----- I forwarded a message to myself to follow up if her echo was scheduled. Order was placed in September, not sure if y'all have tried to reach her about scheduling.  Please try to contact the patient to arrange for an echo if she agreeable. The order is in from her office visit.  Thanks! ----- Message ----- From: Emily Filbert, RN Sent: 11/03/2018 To: Emily Filbert, RN  Is her echo/ pul rehab scheduled?

## 2018-12-03 NOTE — Telephone Encounter (Signed)
Attempted to call patient to schedule ECHO Call dropped  Will try again in a few days

## 2018-12-04 ENCOUNTER — Encounter: Payer: Self-pay | Admitting: *Deleted

## 2018-12-04 DIAGNOSIS — J449 Chronic obstructive pulmonary disease, unspecified: Secondary | ICD-10-CM

## 2018-12-04 NOTE — Progress Notes (Signed)
Pulmonary Individual Treatment Plan  Patient Details  Name: Tina Duarte MRN: [id number] Date of Birth: December 30, 1945 Referring Provider:     Pulmonary Rehab from 11/19/2018 in [facility] Cardiac and Pulmonary Rehab  Referring Provider  Gollan      Initial Encounter Date:    Pulmonary Rehab from 11/19/2018 in [facility] Cardiac and Pulmonary Rehab  Date  11/19/18      Visit Diagnosis: Chronic obstructive pulmonary disease, unspecified COPD type ([place])  Patient's Home Medications on Admission:  Current Outpatient Medications:  .  acetaminophen (TYLENOL) 650 MG CR tablet, Take 650 mg by mouth every 8 (eight) hours as needed for pain., Disp: , Rfl:  .  albuterol (ACCUNEB) 0.63 MG/3ML nebulizer solution, Take 3 mLs (0.63 mg total) by nebulization every 6 (six) hours as needed for wheezing., Disp: 75 mL, Rfl: 12 .  citalopram (CELEXA) 20 MG tablet, Take 1 tablet (20 mg total) by mouth daily., Disp: 90 tablet, Rfl: 1 .  Coenzyme Q10 (COQ10) 30 MG CAPS, Take by mouth., Disp: , Rfl:  .  losartan (COZAAR) 50 MG tablet, TAKE 1 TABLET BY MOUTH ONCE A DAY, Disp: 90 tablet, Rfl: 1 .  PROAIR HFA 108 (90 Base) MCG/ACT inhaler, INL 2 PFS ITL Q 6 H PRF WHZ OR SOB, Disp: , Rfl:  .  rosuvastatin (CRESTOR) 5 MG tablet, Take 1 tablet (5 mg total) by mouth daily., Disp: 90 tablet, Rfl: 1 .  warfarin (COUMADIN) 5 MG tablet, Take 5 mg on Wednesday and Sunday, 2.5 mg all other days., Disp: 30 tablet, Rfl: 1  Past Medical History: Past Medical History:  Diagnosis Date  . Allergy   . Asthma   . Chicken pox   . History of blood clots   . Hypercholesterolemia   . Hypertension     Tobacco Use: Social History   Tobacco Use  Smoking Status Never Smoker  Smokeless Tobacco Never Used    Labs: Recent Review Flowsheet Data    Labs for ITP Cardiac and Pulmonary Rehab Latest Ref Rng & Units 04/10/2017 11/13/2017 07/01/2018 07/09/2018 11/19/2018   Cholestrol 0 - 200 mg/dL 156 248(H) - 187 162   LDLCALC 0 - 99 mg/dL 72 - -  104(H) 77   LDLDIRECT mg/dL - 174.0 - - -   HDL >39.00 mg/dL 52.00 55.50 - 57.30 56.10   Trlycerides 0.0 - 149.0 mg/dL 162.0(H) 245.0(H) - 127.0 144.0   Hemoglobin A1c 4.6 - 6.5 % 6.0 6.0 6.1 - 5.8       Pulmonary Assessment Scores: Pulmonary Assessment Scores    Row Name 11/19/18 1336         ADL UCSD   ADL Phase  Entry     SOB Score total  86     Rest  0     Walk  3     Stairs  5     Bath  5     Dress  5     Shop  5       CAT Score   CAT Score  21       mMRC Score   mMRC Score  4        UCSD: Kleve-administered rating of dyspnea associated with activities of daily living (ADLs) 6-point scale (0 = "not at all" to 5 = "maximal or unable to do because of breathlessness")  Scoring Scores range from 0 to 120.  Minimally important difference is 5 units  CAT: CAT can identify the health impairment of  COPD patients and is better correlated with disease progression.  CAT has a scoring range of zero to 40. The CAT score is classified into four groups of low (less than 10), medium (10 - 20), high (21-30) and very high (31-40) based on the impact level of disease on health status. A CAT score over 10 suggests significant symptoms.  A worsening CAT score could be explained by an exacerbation, poor medication adherence, poor inhaler technique, or progression of COPD or comorbid conditions.  CAT MCID is 2 points  mMRC: mMRC (Modified Medical Research Council) Dyspnea Scale is used to assess the degree of baseline functional disability in patients of respiratory disease due to dyspnea. No minimal important difference is established. A decrease in score of 1 point or greater is considered a positive change.   Pulmonary Function Assessment: Pulmonary Function Assessment - 11/19/18 1425      Breath   Bilateral Breath Sounds  Clear;Decreased    Shortness of Breath  No;Limiting activity       Exercise Target Goals: Exercise Program Goal: Individual exercise prescription set using  results from initial 6 min walk test and THRR while considering  patient's activity barriers and safety.   Exercise Prescription Goal: Initial exercise prescription builds to 30-45 minutes a day of aerobic activity, 2-3 days per week.  Home exercise guidelines will be given to patient during program as part of exercise prescription that the participant will acknowledge.  Activity Barriers & Risk Stratification:   6 Minute Walk: 6 Minute Walk    Row Name 11/19/18 1557         6 Minute Walk   Phase  Initial     Distance  200 feet     Walk Time  2.25 minutes     # of Rest Breaks  1     MPH  1.01     METS  1     RPE  13     Perceived Dyspnea   4     VO2 Peak  0.5     Symptoms  Yes (comment)     Comments  hip and knee pain     Resting HR  74 bpm     Resting BP  128/70     Resting Oxygen Saturation   93 %     Exercise Oxygen Saturation  during 6 min walk  92 %     Max Ex. HR  113 bpm     Max Ex. BP  142/76     2 Minute Post BP  124/76       Interval HR   1 Minute HR  106     2 Minute HR  113     3 Minute HR  94     4 Minute HR  96     Interval Heart Rate?  Yes       Interval Oxygen   Interval Oxygen?  Yes     Baseline Oxygen Saturation %  93 %     1 Minute Oxygen Saturation %  92 %     2 Minute Oxygen Saturation %  92 %     3 Minute Oxygen Saturation %  93 %     4 Minute Oxygen Saturation %  93 %       Oxygen Initial Assessment: Oxygen Initial Assessment - 11/19/18 1335      Home Oxygen   Home Oxygen Device  Home Concentrator;E-Tanks    Sleep Oxygen Prescription  Continuous    Liters per minute  1    Home Exercise Oxygen Prescription  None    Home at Rest Exercise Oxygen Prescription  None    Compliance with Home Oxygen Use  Yes      Initial 6 min Walk   Oxygen Used  None      Program Oxygen Prescription   Program Oxygen Prescription  None      Intervention   Short Term Goals  To learn and demonstrate proper use of respiratory medications;To learn and  demonstrate proper pursed lip breathing techniques or other breathing techniques.;To learn and understand importance of maintaining oxygen saturations>88%;To learn and understand importance of monitoring SPO2 with pulse oximeter and demonstrate accurate use of the pulse oximeter.;To learn and exhibit compliance with exercise, home and travel O2 prescription    Long  Term Goals  Demonstrates proper use of MDI's;Compliance with respiratory medication;Exhibits proper breathing techniques, such as pursed lip breathing or other method taught during program session;Maintenance of O2 saturations>88%;Verbalizes importance of monitoring SPO2 with pulse oximeter and return demonstration;Exhibits compliance with exercise, home and travel O2 prescription       Oxygen Re-Evaluation:   Oxygen Discharge (Final Oxygen Re-Evaluation):   Initial Exercise Prescription: Initial Exercise Prescription - 11/19/18 1500      Date of Initial Exercise RX and Referring Provider   Date  11/19/18    Referring Provider  Gollan      Treadmill   MPH  0.5    Grade  0    Minutes  15   walk for 60 sec then rest   METs  1      Recumbant Bike   Level  1    RPM  60    Minutes  15    METs  1      NuStep   Level  1    SPM  80    Minutes  15    METs  1      T5 Nustep   Level  1    SPM  80    Minutes  15    METs  1      Biostep-RELP   Level  1    SPM  50    Minutes  15    METs  1      Prescription Details   Frequency (times per week)  3    Duration  Progress to 30 minutes of continuous aerobic without signs/symptoms of physical distress      Intensity   THRR 40-80% of Max Heartrate  102-130    Ratings of Perceived Exertion  11-15    Perceived Dyspnea  0-4      Resistance Training   Training Prescription  Yes    Weight  3 lb    Reps  10-15       Perform Capillary Blood Glucose checks as needed.  Exercise Prescription Changes: Exercise Prescription Changes    Row Name 11/19/18 1600 11/27/18  1300           Response to Exercise   Blood Pressure (Admit)  128/70  134/76      Blood Pressure (Exercise)  142/76  140/72      Blood Pressure (Exit)  124/76  128/80      Heart Rate (Admit)  74 bpm  97 bpm      Heart Rate (Exercise)  113 bpm  98 bpm      Heart Rate (Exit)  96  bpm  91 bpm      Oxygen Saturation (Admit)  93 %  94 %      Oxygen Saturation (Exercise)  92 %  90 %      Oxygen Saturation (Exit)  93 %  92 %      Rating of Perceived Exertion (Exercise)  13  17      Perceived Dyspnea (Exercise)  4  0      Symptoms  hip and knee pain  SOB, pain on TM      Comments  -  first full day of exercise      Duration  -  Progress to 30 minutes of  aerobic without signs/symptoms of physical distress      Intensity  -  THRR unchanged        Progression   Progression  -  Continue to progress workloads to maintain intensity without signs/symptoms of physical distress.      Average METs  -  1.7        Resistance Training   Training Prescription  -  Yes      Weight  -  ROM      Reps  -  10-15        Interval Training   Interval Training  -  No        Treadmill   MPH  -  0.5      Grade  -  0      Minutes  -  2      METs  -  1.5        T5 Nustep   Level  -  1      Minutes  -  15      METs  -  1.8         Exercise Comments: Exercise Comments    Row Name 11/20/18 0756           Exercise Comments  First full day of exercise!  Patient was oriented to gym and equipment including functions, settings, policies, and procedures.  Patient's individual exercise prescription and treatment plan were reviewed.  All starting workloads were established based on the results of the 6 minute walk test done at initial orientation visit.  The plan for exercise progression was also introduced and progression will be customized based on patient's performance and goals.          Exercise Goals and Review: Exercise Goals    Row Name 11/19/18 1602             Exercise Goals   Increase  Physical Activity  Yes       Intervention  Provide advice, education, support and counseling about physical activity/exercise needs.;Develop an individualized exercise prescription for aerobic and resistive training based on initial evaluation findings, risk stratification, comorbidities and participant's personal goals.       Expected Outcomes  Short Term: Attend rehab on a regular basis to increase amount of physical activity.;Long Term: Add in home exercise to make exercise part of routine and to increase amount of physical activity.;Long Term: Exercising regularly at least 3-5 days a week.       Increase Strength and Stamina  Yes       Intervention  Provide advice, education, support and counseling about physical activity/exercise needs.;Develop an individualized exercise prescription for aerobic and resistive training based on initial evaluation findings, risk stratification, comorbidities and participant's personal goals.       Expected Outcomes  Short Term: Increase workloads from initial exercise prescription for resistance, speed, and METs.;Short Term: Perform resistance training exercises routinely during rehab and add in resistance training at home;Long Term: Improve cardiorespiratory fitness, muscular endurance and strength as measured by increased METs and functional capacity (6MWT)       Able to understand and use rate of perceived exertion (RPE) scale  Yes       Intervention  Provide education and explanation on how to use RPE scale       Expected Outcomes  Short Term: Able to use RPE daily in rehab to express subjective intensity level;Long Term:  Able to use RPE to guide intensity level when exercising independently       Able to understand and use Dyspnea scale  Yes       Intervention  Provide education and explanation on how to use Dyspnea scale       Expected Outcomes  Short Term: Able to use Dyspnea scale daily in rehab to express subjective sense of shortness of breath during  exertion;Long Term: Able to use Dyspnea scale to guide intensity level when exercising independently       Knowledge and understanding of Target Heart Rate Range (THRR)  Yes       Intervention  Provide education and explanation of THRR including how the numbers were predicted and where they are located for reference       Expected Outcomes  Short Term: Able to state/look up THRR;Short Term: Able to use daily as guideline for intensity in rehab;Long Term: Able to use THRR to govern intensity when exercising independently       Able to check pulse independently  Yes       Intervention  Provide education and demonstration on how to check pulse in carotid and radial arteries.;Review the importance of being able to check your own pulse for safety during independent exercise       Expected Outcomes  Short Term: Able to explain why pulse checking is important during independent exercise;Long Term: Able to check pulse independently and accurately       Understanding of Exercise Prescription  Yes       Intervention  Provide education, explanation, and written materials on patient's individual exercise prescription       Expected Outcomes  Short Term: Able to explain program exercise prescription;Long Term: Able to explain home exercise prescription to exercise independently          Exercise Goals Re-Evaluation : Exercise Goals Re-Evaluation    Row Name 11/20/18 0758 11/27/18 1353           Exercise Goal Re-Evaluation   Exercise Goals Review  Increase Physical Activity;Able to understand and use rate of perceived exertion (RPE) scale;Understanding of Exercise Prescription;Able to understand and use Dyspnea scale;Increase Strength and Stamina;Knowledge and understanding of Target Heart Rate Range (THRR)  -      Comments  Reviewed RPE scale, THR and program prescription with pt today.  Pt voiced understanding and was given a copy of goals to take home.  Out since first visit.  Vacation this week.       Expected Outcomes  Short: Use RPE daily to regulate intensity. Long: Follow program prescription in [facility].  -         Discharge Exercise Prescription (Final Exercise Prescription Changes): Exercise Prescription Changes - 11/27/18 1300      Response to Exercise   Blood Pressure (Admit)  134/76    Blood Pressure (Exercise)  140/72    Blood Pressure (Exit)  128/80    Heart Rate (Admit)  97 bpm    Heart Rate (Exercise)  98 bpm    Heart Rate (Exit)  91 bpm    Oxygen Saturation (Admit)  94 %    Oxygen Saturation (Exercise)  90 %    Oxygen Saturation (Exit)  92 %    Rating of Perceived Exertion (Exercise)  17    Perceived Dyspnea (Exercise)  0    Symptoms  SOB, pain on TM    Comments  first full day of exercise    Duration  Progress to 30 minutes of  aerobic without signs/symptoms of physical distress    Intensity  THRR unchanged      Progression   Progression  Continue to progress workloads to maintain intensity without signs/symptoms of physical distress.    Average METs  1.7      Resistance Training   Training Prescription  Yes    Weight  ROM    Reps  10-15      Interval Training   Interval Training  No      Treadmill   MPH  0.5    Grade  0    Minutes  2    METs  1.5      T5 Nustep   Level  1    Minutes  15    METs  1.8       Nutrition:  Target Goals: Understanding of nutrition guidelines, daily intake of sodium '1500mg'$ , cholesterol '200mg'$ , calories 30% from fat and 7% or less from saturated fats, daily to have 5 or more servings of fruits and vegetables.  Biometrics: Pre Biometrics - 11/19/18 1603      Pre Biometrics   Height  '5\' 4"'$  (1.626 m)    Weight  240 lb 9.6 oz (109.1 kg)    BMI (Calculated)  41.28        Nutrition Therapy Plan and Nutrition Goals:   Nutrition Assessments: Nutrition Assessments - 11/19/18 1435      MEDFICTS Scores   Pre Score  29       Nutrition Goals Re-Evaluation:   Nutrition Goals Discharge (Final Nutrition Goals  Re-Evaluation):   Psychosocial: Target Goals: Acknowledge presence or absence of significant depression and/or stress, maximize coping skills, provide positive support system. Participant is able to verbalize types and ability to use techniques and skills needed for reducing stress and depression.   Initial Review & Psychosocial Screening: Initial Psych Review & Screening - 11/19/18 1426      Initial Review   Current issues with  History of Depression;Current Psychotropic Meds;Current Depression      Family Dynamics   Good Support System?  Yes    Comments  She can look to her two children for support.      Barriers   Psychosocial barriers to participate in program  The patient should benefit from training in stress management and relaxation.      Screening Interventions   Interventions  Provide feedback about the scores to participant;To provide support and resources with identified psychosocial needs;Encouraged to exercise    Expected Outcomes  Short Term goal: Utilizing psychosocial counselor, staff and physician to assist with identification of specific Stressors or current issues interfering with healing process. Setting desired goal for each stressor or current issue identified.;Long Term Goal: Stressors or current issues are controlled or eliminated.;Short Term goal: Identification and review with participant of any Quality of Life or Depression  concerns found by scoring the questionnaire.;Long Term goal: The participant improves quality of Life and PHQ9 Scores as seen by post scores and/or verbalization of changes       Quality of Life Scores:  Scores of 19 and below usually indicate a poorer quality of life in these areas.  A difference of  2-3 points is a clinically meaningful difference.  A difference of 2-3 points in the total score of the Quality of Life Index has been associated with significant improvement in overall quality of life, [place]-image, physical symptoms, and general  health in studies assessing change in quality of life.  PHQ-9: Recent Review Flowsheet Data    Depression screen [facility] 2/9 11/19/2018 08/19/2018 08/14/2017 12/21/2015 09/15/2014   Decreased Interest 1 0 0 0 0   Down, Depressed, Hopeless 0 0 0 0 0   PHQ - 2 Score 1 0 0 0 0   Altered sleeping 1 - - - -   Tired, decreased energy 1 - - - -   Change in appetite 0 - - - -   Feeling bad or failure about yourself  3 - - - -   Trouble concentrating 0 - - - -   Moving slowly or fidgety/restless 0 - - - -   Suicidal thoughts 0 - - - -   PHQ-9 Score 6 - - - -   Difficult doing work/chores Very difficult - - - -     Interpretation of Total Score  Total Score Depression Severity:  1-4 = Minimal depression, 5-9 = Mild depression, 10-14 = Moderate depression, 15-19 = Moderately severe depression, 20-27 = Severe depression   Psychosocial Evaluation and Intervention:   Psychosocial Re-Evaluation:   Psychosocial Discharge (Final Psychosocial Re-Evaluation):   Education: Education Goals: Education classes will be provided on a weekly basis, covering required topics. Participant will state understanding/return demonstration of topics presented.  Learning Barriers/Preferences: Learning Barriers/Preferences - 11/19/18 1427      Learning Barriers/Preferences   Learning Barriers  None    Learning Preferences  None       Education Topics:  Initial Evaluation Education: - Verbal, written and demonstration of respiratory meds, oximetry and breathing techniques. Instruction on use of nebulizers and MDIs and importance of monitoring MDI activations.   Cardiac Rehab from 11/20/2018 in [facility] Cardiac and Pulmonary Rehab  Date  11/19/18  Educator  [facility]  Instruction Review Code  1- Verbalizes Understanding      General Nutrition Guidelines/Fats and Fiber: -Group instruction provided by verbal, written material, models and posters to present the general guidelines for heart healthy nutrition. Gives an  explanation and review of dietary fats and fiber.   Controlling Sodium/Reading Food Labels: -Group verbal and written material supporting the discussion of sodium use in heart healthy nutrition. Review and explanation with models, verbal and written materials for utilization of the food label.   Exercise Physiology & General Exercise Guidelines: - Group verbal and written instruction with models to review the exercise physiology of the cardiovascular system and associated critical values. Provides general exercise guidelines with specific guidelines to those with heart or lung disease.    Aerobic Exercise & Resistance Training: - Gives group verbal and written instruction on the various components of exercise. Focuses on aerobic and resistive training programs and the benefits of this training and how to safely progress through these programs.   Cardiac Rehab from 11/20/2018 in [facility] Cardiac and Pulmonary Rehab  Date  11/20/18  Educator  AS  Instruction Review  Code  1- Geologist, engineering, Balance, Mind/Body Relaxation: Provides group verbal/written instruction on the benefits of flexibility and balance training, including mind/body exercise modes such as yoga, pilates and tai chi.  Demonstration and skill practice provided.   Stress and Anxiety: - Provides group verbal and written instruction about the health risks of elevated stress and causes of high stress.  Discuss the correlation between heart/lung disease and anxiety and treatment options. Review healthy ways to manage with stress and anxiety.   Depression: - Provides group verbal and written instruction on the correlation between heart/lung disease and depressed mood, treatment options, and the stigmas associated with seeking treatment.   Exercise & Equipment Safety: - Individual verbal instruction and demonstration of equipment use and safety with use of the equipment.   Cardiac Rehab from 11/20/2018 in  [facility] Cardiac and Pulmonary Rehab  Date  11/19/18  Educator  [facility]  Instruction Review Code  1- Verbalizes Understanding      Infection Prevention: - Provides verbal and written material to individual with discussion of infection control including proper hand washing and proper equipment cleaning during exercise session.   Cardiac Rehab from 11/20/2018 in [facility] Cardiac and Pulmonary Rehab  Date  11/19/18  Educator  [facility]  Instruction Review Code  1- Verbalizes Understanding      Falls Prevention: - Provides verbal and written material to individual with discussion of falls prevention and safety.   Cardiac Rehab from 11/20/2018 in [facility] Cardiac and Pulmonary Rehab  Date  11/19/18  Educator  [facility]  Instruction Review Code  1- Verbalizes Understanding      Diabetes: - Individual verbal and written instruction to review signs/symptoms of diabetes, desired ranges of glucose level fasting, after meals and with exercise. Advice that pre and post exercise glucose checks will be done for 3 sessions at entry of program.   Chronic Lung Diseases: - Group verbal and written instruction to review updates, respiratory medications, advancements in procedures and treatments. Discuss use of supplemental oxygen including available portable oxygen systems, continuous and intermittent flow rates, concentrators, personal use and safety guidelines. Review proper use of inhaler and spacers. Provide informative websites for [place]-education.    Energy Conservation: - Provide group verbal and written instruction for methods to conserve energy, plan and organize activities. Instruct on pacing techniques, use of adaptive equipment and posture/positioning to relieve shortness of breath.   Triggers and Exacerbations: - Group verbal and written instruction to review types of environmental triggers and ways to prevent exacerbations. Discuss weather changes, air quality and the benefits of nasal washing. Review warning signs  and symptoms to help prevent infections. Discuss techniques for effective airway clearance, coughing, and vibrations.   AED/CPR: - Group verbal and written instruction with the use of models to demonstrate the basic use of the AED with the basic ABC's of resuscitation.   Anatomy and Physiology of the Lungs: - Group verbal and written instruction with the use of models to provide basic lung anatomy and physiology related to function, structure and complications of lung disease.   Anatomy & Physiology of the Heart: - Group verbal and written instruction and models provide basic cardiac anatomy and physiology, with the coronary electrical and arterial systems. Review of Valvular disease and Heart Failure   Cardiac Medications: - Group verbal and written instruction to review commonly prescribed medications for heart disease. Reviews the medication, class of the drug, and side effects.   Know Your Numbers and Risk Factors: -Group  verbal and written instruction about important numbers in your health.  Discussion of what are risk factors and how they play a role in the disease process.  Review of Cholesterol, Blood Pressure, Diabetes, and BMI and the role they play in your overall health.   Cardiac Rehab from 11/20/2018 in [facility]  Date  11/20/18  Educator  SB  Instruction Review Code  1- Verbalizes Understanding      Sleep Hygiene: -Provides group verbal and written instruction about how sleep can affect your health.  Define sleep hygiene, discuss sleep cycles and impact of sleep habits. Review good sleep hygiene tips.    Other: -Provides group and verbal instruction on various topics (see comments)    Knowledge Questionnaire Score: Knowledge Questionnaire Score - 11/19/18 1414      Knowledge Questionnaire Score   Pre Score  13/18   Reviewed with patient       Core Components/Risk Factors/Patient Goals at Admission: Personal Goals and Risk Factors at  Admission - 11/19/18 1611      Core Components/Risk Factors/Patient Goals on Admission    Weight Management  Yes;Obesity    Intervention  Weight Management/Obesity: Establish reasonable short term and long term weight goals.;Weight Management: Provide education and appropriate resources to help participant work on and attain dietary goals.;Weight Management: Develop a combined nutrition and exercise program designed to reach desired caloric intake, while maintaining appropriate intake of nutrient and fiber, sodium and fats, and appropriate energy expenditure required for the weight goal.;Obesity: Provide education and appropriate resources to help participant work on and attain dietary goals.    Admit Weight  240 lb 9.6 oz (109.1 kg)    Expected Outcomes  Short Term: Continue to assess and modify interventions until short term weight is achieved;Long Term: Adherence to nutrition and physical activity/exercise program aimed toward attainment of established weight goal;Weight Loss: Understanding of general recommendations for a balanced deficit meal plan, which promotes 1-2 lb weight loss per week and includes a negative energy balance of [phone] kcal/d;Understanding recommendations for meals to include 15-35% energy as protein, 25-35% energy from fat, 35-60% energy from carbohydrates, less than '200mg'$  of dietary cholesterol, 20-35 gm of total fiber daily;Understanding of distribution of calorie intake throughout the day with the consumption of 4-5 meals/snacks    Improve shortness of breath with ADL's  Yes    Intervention  Provide education, individualized exercise plan and daily activity instruction to help decrease symptoms of SOB with activities of daily living.    Expected Outcomes  Short Term: Improve cardiorespiratory fitness to achieve a reduction of symptoms when performing ADLs;Long Term: Be able to perform more ADLs without symptoms or delay the onset of symptoms    Intervention  Provide education  on lifestyle modifcations including regular physical activity/exercise, weight management, moderate sodium restriction and increased consumption of fresh fruit, vegetables, and low fat dairy, alcohol moderation, and smoking cessation.;Monitor prescription use compliance.    Expected Outcomes  Long Term: Maintenance of blood pressure at goal levels.;Short Term: Continued assessment and intervention until BP is < 140/62m HG in hypertensive participants. < 130/837mHG in hypertensive participants with diabetes, heart failure or chronic kidney disease.    Intervention  Provide education and support for participant on nutrition & aerobic/resistive exercise along with prescribed medications to achieve LDL '70mg'$ , HDL >'40mg'$ .    Expected Outcomes  Short Term: Participant states understanding of desired cholesterol values and is compliant with medications prescribed. Participant is following exercise prescription and nutrition  guidelines.;Long Term: Cholesterol controlled with medications as prescribed, with individualized exercise RX and with personalized nutrition plan. Value goals: LDL < '70mg'$ , HDL > 40 mg.       Core Components/Risk Factors/Patient Goals Review:    Core Components/Risk Factors/Patient Goals at Discharge (Final Review):    ITP Comments: ITP Comments    Row Name 12/04/18 0703           ITP Comments  30 day review completed. Continue with ITP sent to Dr. Emily Filbert, Medical Director of Cardiac and Pulmonary Rehab for review , changes as needed and signature. Last visit 10/21          Comments:

## 2018-12-06 ENCOUNTER — Telehealth: Payer: Self-pay

## 2018-12-06 NOTE — Telephone Encounter (Signed)
Knee/lower leg  is still swollen and she cant walk or get in the auto of people who can bring her.  She said leg feels hot - I advised her to call Dr  And she will contact her Dr today either by phone or my chart message.  She would like to switch to afternoon time slot to see if that works better.

## 2018-12-09 ENCOUNTER — Ambulatory Visit: Payer: Medicare Other

## 2018-12-11 ENCOUNTER — Encounter: Payer: Self-pay | Admitting: *Deleted

## 2018-12-11 ENCOUNTER — Ambulatory Visit (INDEPENDENT_AMBULATORY_CARE_PROVIDER_SITE_OTHER): Payer: Medicare Other | Admitting: Internal Medicine

## 2018-12-11 ENCOUNTER — Other Ambulatory Visit: Payer: Self-pay | Admitting: Internal Medicine

## 2018-12-11 ENCOUNTER — Ambulatory Visit: Payer: Medicare Other

## 2018-12-11 ENCOUNTER — Other Ambulatory Visit: Payer: Self-pay

## 2018-12-11 VITALS — BP 126/68 | HR 96 | Temp 97.6°F | Resp 16 | Wt 240.2 lb

## 2018-12-11 DIAGNOSIS — Z7901 Long term (current) use of anticoagulants: Secondary | ICD-10-CM | POA: Diagnosis not present

## 2018-12-11 DIAGNOSIS — J449 Chronic obstructive pulmonary disease, unspecified: Secondary | ICD-10-CM

## 2018-12-11 DIAGNOSIS — M170 Bilateral primary osteoarthritis of knee: Secondary | ICD-10-CM | POA: Diagnosis not present

## 2018-12-11 DIAGNOSIS — D473 Essential (hemorrhagic) thrombocythemia: Secondary | ICD-10-CM

## 2018-12-11 DIAGNOSIS — Z23 Encounter for immunization: Secondary | ICD-10-CM | POA: Diagnosis not present

## 2018-12-11 DIAGNOSIS — I10 Essential (primary) hypertension: Secondary | ICD-10-CM

## 2018-12-11 DIAGNOSIS — E7800 Pure hypercholesterolemia, unspecified: Secondary | ICD-10-CM

## 2018-12-11 DIAGNOSIS — J4520 Mild intermittent asthma, uncomplicated: Secondary | ICD-10-CM | POA: Diagnosis not present

## 2018-12-11 DIAGNOSIS — D75839 Thrombocytosis, unspecified: Secondary | ICD-10-CM

## 2018-12-11 DIAGNOSIS — M25562 Pain in left knee: Secondary | ICD-10-CM

## 2018-12-11 DIAGNOSIS — R739 Hyperglycemia, unspecified: Secondary | ICD-10-CM

## 2018-12-11 LAB — CBC WITH DIFFERENTIAL/PLATELET
Basophils Absolute: 0.1 10*3/uL (ref 0.0–0.1)
Basophils Relative: 0.7 % (ref 0.0–3.0)
Eosinophils Absolute: 0.4 10*3/uL (ref 0.0–0.7)
Eosinophils Relative: 4.1 % (ref 0.0–5.0)
HCT: 41 % (ref 36.0–46.0)
Hemoglobin: 13.5 g/dL (ref 12.0–15.0)
Lymphocytes Relative: 29.4 % (ref 12.0–46.0)
Lymphs Abs: 3.2 10*3/uL (ref 0.7–4.0)
MCHC: 32.8 g/dL (ref 30.0–36.0)
MCV: 89.7 fl (ref 78.0–100.0)
Monocytes Absolute: 0.6 10*3/uL (ref 0.1–1.0)
Monocytes Relative: 5.6 % (ref 3.0–12.0)
Neutro Abs: 6.6 10*3/uL (ref 1.4–7.7)
Neutrophils Relative %: 60.2 % (ref 43.0–77.0)
Platelets: 426 10*3/uL — ABNORMAL HIGH (ref 150.0–400.0)
RBC: 4.57 Mil/uL (ref 3.87–5.11)
RDW: 14.2 % (ref 11.5–15.5)
WBC: 10.9 10*3/uL — ABNORMAL HIGH (ref 4.0–10.5)

## 2018-12-11 LAB — PROTIME-INR
INR: 3 ratio — ABNORMAL HIGH (ref 0.8–1.0)
Prothrombin Time: 34.7 s — ABNORMAL HIGH (ref 9.6–13.1)

## 2018-12-11 NOTE — Telephone Encounter (Signed)
Left voicemail message to call back to schedule echocardiogram.

## 2018-12-11 NOTE — Progress Notes (Signed)
Order placed for f/u labs.  

## 2018-12-11 NOTE — Progress Notes (Signed)
[name], female   DOB: 1946/01/21, 73 y.o.   MRN: [id number]   Subjective:    [name], female    DOB: 16-Mar-1945, 73 y.o.   MRN: [id number]  HPI  Patient here as a work in with concerns regarding persistent left knee pain.  She fell 11/22/18.  Landed on her left leg/knee.  Has had persistent pain since the fall.  On questioning her, the pain is below her knee.  Is on coumadin.  Had increased bruising after the fall.  Feels tight - just under the knee.  No redness.  No swelling above the knee.  No chest pain.  Breathing stable.  No increased cough, congestion or sob.  No acid reflux.  No abdominal pain.  No headache or dizziness.     Past Medical History:  Diagnosis Date  . Allergy   . Asthma   . Chicken pox   . History of blood clots   . Hypercholesterolemia   . Hypertension    Past Surgical History:  Procedure Laterality Date  . blood clots  2008   Family History  Problem Relation Age of Onset  . Cancer Mother        Breast  . Heart disease Mother   . Stroke Mother   . Hypertension Mother   . Breast cancer Mother        75's  . Heart disease Father   . Stroke Father   . Hypertension Father   . Diabetes Father   . Colon cancer Other        paternal cousin   Social History   Socioeconomic History  . Marital status: Widowed    Spouse name: Not on file  . Number of children: Not on file  . Years of education: Not on file  . Highest education level: Not on file  Occupational History  . Not on file  Social Needs  . Financial resource strain: Not hard at all  . Food insecurity    Worry: Never true    Inability: Never true  . Transportation needs    Medical: No    Non-medical: No  Tobacco Use  . Smoking status: Never Smoker  . Smokeless tobacco: Never Used  Substance and Sexual Activity  . Alcohol use: No    Alcohol/week: 0.0 standard drinks  . Drug use: No  . Sexual activity: Not on file  Lifestyle  . Physical activity    Days  per week: 0 days    Minutes per session: Not on file  . Stress: Not at all  Relationships  . Social Herbalist on phone: Not on file    Gets together: Not on file    Attends religious service: Not on file    Active member of club or organization: Not on file    Attends meetings of clubs or organizations: Not on file    Relationship status: Not on file  Other Topics Concern  . Not on file  Social History Narrative  . Not on file    Outpatient Encounter Medications as of 12/11/2018  Medication Sig  . acetaminophen (TYLENOL) 650 MG CR tablet Take 650 mg by mouth every 8 (eight) hours as needed for pain.  Marland Kitchen albuterol (ACCUNEB) 0.63 MG/3ML nebulizer solution Take 3 mLs (0.63 mg total) by nebulization every 6 (six) hours as needed for wheezing.  . citalopram (CELEXA) 20 MG tablet Take 1 tablet (20 mg total) by mouth  daily.  . Coenzyme Q10 (COQ10) 30 MG CAPS Take by mouth.  . losartan (COZAAR) 50 MG tablet TAKE 1 TABLET BY MOUTH ONCE A DAY  . PROAIR HFA 108 (90 Base) MCG/ACT inhaler INL 2 PFS ITL Q 6 H PRF WHZ OR SOB  . rosuvastatin (CRESTOR) 5 MG tablet Take 1 tablet (5 mg total) by mouth daily.  Marland Kitchen warfarin (COUMADIN) 5 MG tablet Take 5 mg on Wednesday and Sunday, 2.5 mg all other days.   No facility-administered encounter medications on file as of 12/11/2018.     Review of Systems  Constitutional: Negative for appetite change and unexpected weight change.  HENT: Negative for congestion and sinus pressure.   Respiratory: Negative for cough and chest tightness.        Breathing stable.   Cardiovascular: Negative for chest pain and palpitations.  Gastrointestinal: Negative for abdominal pain, diarrhea, nausea and vomiting.  Genitourinary: Negative for difficulty urinating and dysuria.  Musculoskeletal: Negative for myalgias.       Increased pain - below knee. Increased soft tissue swelling - below knee.    Skin: Negative for color change and rash.  Neurological: Negative  for dizziness, light-headedness and headaches.  Psychiatric/Behavioral: Negative for agitation and dysphoric mood.       Objective:    Physical Exam Constitutional:      General: She is not in acute distress.    Appearance: Normal appearance.  HENT:     Head: Normocephalic and atraumatic.     Right Ear: External ear normal.     Left Ear: External ear normal.  Eyes:     General: No scleral icterus.       Right eye: No discharge.        Left eye: No discharge.     Conjunctiva/sclera: Conjunctivae normal.  Neck:     Musculoskeletal: Neck supple. No muscular tenderness.     Thyroid: No thyromegaly.  Cardiovascular:     Rate and Rhythm: Normal rate and regular rhythm.  Pulmonary:     Effort: No respiratory distress.     Breath sounds: Normal breath sounds. No wheezing.  Abdominal:     General: Bowel sounds are normal.     Palpations: Abdomen is soft.     Tenderness: There is no abdominal tenderness.  Musculoskeletal:     Comments: Increased soft tissue swelling (firm) - just below left knee.  No pain in the knee joint.  Increased pain to palpation.  No swelling into the thigh.  DP pulse palpable and equal bilaterally.    Lymphadenopathy:     Cervical: No cervical adenopathy.  Skin:    Findings: No erythema or rash.  Neurological:     Mental Status: She is alert.  Psychiatric:        Mood and Affect: Mood normal.        Behavior: Behavior normal.     BP 126/68   Pulse 96   Temp 97.6 F (36.4 C)   Resp 16   Wt 240 lb 3.2 oz (109 kg)   LMP 04/29/1997   SpO2 97%   BMI 41.23 kg/m  Wt Readings from Last 3 Encounters:  12/11/18 240 lb 3.2 oz (109 kg)  11/22/18 237 lb 6.4 oz (107.7 kg)  11/19/18 240 lb 9.6 oz (109.1 kg)     Lab Results  Component Value Date   WBC 10.9 (H) 12/11/2018   HGB 13.5 12/11/2018   HCT 41.0 12/11/2018   PLT 426.0 (H) 12/11/2018  GLUCOSE 88 11/19/2018   CHOL 162 11/19/2018   TRIG 144.0 11/19/2018   HDL 56.10 11/19/2018   LDLDIRECT  174.0 11/13/2017   LDLCALC 77 11/19/2018   ALT 20 11/19/2018   AST 29 11/19/2018   NA 140 11/19/2018   K 4.4 11/19/2018   CL 103 11/19/2018   CREATININE 1.08 11/19/2018   BUN 11 11/19/2018   CO2 29 11/19/2018   TSH 4.07 09/05/2018   INR 3.0 (H) 12/11/2018   HGBA1C 5.8 11/19/2018    Dg Chest 2 View  Result Date: 01/31/2018 CLINICAL DATA:  Shortness of breath and wheezing for past 3 days. History of asthma EXAM: CHEST - 2 VIEW COMPARISON:  PA and lateral chest x-ray of November 13, 2017 FINDINGS: The lungs are well-expanded. There is no focal infiltrate. There is no pleural effusion. The heart and pulmonary vascularity are normal. There is calcification in wall of the aortic arch. The bony thorax exhibits no acute abnormality. IMPRESSION: There is no active cardiopulmonary disease. Mild chronic bronchitic-reactive airway changes. Thoracic aortic atherosclerosis. Electronically Signed   By: David  Martinique M.D.   On: 01/31/2018 14:38       Assessment & Plan:   Problem List Items Addressed This Visit    Asthma    Breathing stable.  Doing well.  No cough or congestion.  Follow.       Essential hypertension, benign    Blood pressure under good control.  Continue same medication regimen.  Follow pressures.  Follow metabolic panel.        Hypercholesterolemia    Low cholesterol diet and exercise.  Follow lipid panel.       Hyperglycemia    Low carb diet and exercise.  Follow met b and a1c.       Left knee pain    Pain appears to be more localized - beneath the knee.  Firm.  Concern over possible hematoma - inferior to knee.  No swelling above knee.  Given persistent pain and fullness, will have ortho evaluate. She will see [facility] today - [facility].  Follow.        Long term current use of anticoagulant therapy    Due f/u pt/inr.  Check today.        Other Visit Diagnoses    Anticoagulated on Coumadin    -  Primary   Relevant Orders   CBC with Differential/Platelet (Completed)    Protime-INR (Completed)   Need for immunization against influenza       Relevant Orders   Flu Vaccine QUAD High Dose(Fluad) (Completed)       Einar Pheasant, MD

## 2018-12-12 ENCOUNTER — Ambulatory Visit: Payer: Medicare Other

## 2018-12-12 NOTE — Telephone Encounter (Signed)
Spoke with patient and she is agreeable to schedule echocardiogram

## 2018-12-15 ENCOUNTER — Encounter: Payer: Self-pay | Admitting: Internal Medicine

## 2018-12-15 DIAGNOSIS — M25562 Pain in left knee: Secondary | ICD-10-CM | POA: Insufficient documentation

## 2018-12-15 NOTE — Assessment & Plan Note (Signed)
Due f/u pt/inr.  Check today.

## 2018-12-15 NOTE — Assessment & Plan Note (Signed)
Blood pressure under good control.  Continue same medication regimen.  Follow pressures.  Follow metabolic panel.   

## 2018-12-15 NOTE — Assessment & Plan Note (Signed)
Low cholesterol diet and exercise.  Follow lipid panel.   

## 2018-12-15 NOTE — Assessment & Plan Note (Signed)
Low carb diet and exercise.  Follow met b and a1c.  

## 2018-12-15 NOTE — Assessment & Plan Note (Signed)
Breathing stable.  Doing well.  No cough or congestion.  Follow.

## 2018-12-15 NOTE — Assessment & Plan Note (Signed)
Pain appears to be more localized - beneath the knee.  Firm.  Concern over possible hematoma - inferior to knee.  No swelling above knee.  Given persistent pain and fullness, will have ortho evaluate. She will see [facility] today - [facility].  Follow.

## 2018-12-16 ENCOUNTER — Ambulatory Visit: Payer: Medicare Other

## 2018-12-18 ENCOUNTER — Ambulatory Visit: Payer: Medicare Other

## 2018-12-18 ENCOUNTER — Other Ambulatory Visit: Payer: Self-pay | Admitting: Internal Medicine

## 2018-12-18 NOTE — Telephone Encounter (Signed)
Medication Refill - Medication: warfarin (COUMADIN) 5 MG tablet    Has the patient contacted their pharmacy? Yes.   (Agent: If no, request that the patient contact the pharmacy for the refill.) (Agent: If yes, when and what did the pharmacy advise?)  Preferred Pharmacy (with phone number or street name): [id number] - [place], [place]: Please be advised that RX refills may take up to 3 business days. We ask that you follow-up with your pharmacy.

## 2018-12-19 ENCOUNTER — Ambulatory Visit: Payer: Medicare Other

## 2018-12-19 MED ORDER — WARFARIN SODIUM 5 MG PO TABS: ORAL_TABLET | ORAL | 1 refills | Status: DC

## 2018-12-23 ENCOUNTER — Telehealth: Payer: Self-pay | Admitting: *Deleted

## 2018-12-23 ENCOUNTER — Encounter: Payer: Self-pay | Admitting: *Deleted

## 2018-12-23 ENCOUNTER — Ambulatory Visit: Payer: Medicare Other

## 2018-12-23 DIAGNOSIS — J449 Chronic obstructive pulmonary disease, unspecified: Secondary | ICD-10-CM

## 2018-12-23 NOTE — Telephone Encounter (Signed)
Called to check on pt.  She has been out since 11/20/18.  Left message.

## 2018-12-25 ENCOUNTER — Ambulatory Visit: Payer: Medicare Other

## 2018-12-30 ENCOUNTER — Ambulatory Visit: Payer: Medicare Other

## 2018-12-31 ENCOUNTER — Encounter: Payer: Self-pay | Admitting: *Deleted

## 2018-12-31 ENCOUNTER — Telehealth: Payer: Self-pay | Admitting: *Deleted

## 2018-12-31 DIAGNOSIS — J449 Chronic obstructive pulmonary disease, unspecified: Secondary | ICD-10-CM

## 2018-12-31 NOTE — Telephone Encounter (Signed)
Called to check on pt.  Out since 11/20/18. Left message.  Sent letter.

## 2019-01-01 ENCOUNTER — Encounter: Payer: Self-pay | Admitting: *Deleted

## 2019-01-01 ENCOUNTER — Ambulatory Visit: Payer: Medicare Other

## 2019-01-01 DIAGNOSIS — J449 Chronic obstructive pulmonary disease, unspecified: Secondary | ICD-10-CM

## 2019-01-01 NOTE — Progress Notes (Signed)
Pulmonary Individual Treatment Plan  Patient Details  Name: Tina Duarte MRN: [id number] Date of Birth: 07-03-1945 Referring Provider:     Pulmonary Rehab from 11/19/2018 in [facility] Cardiac and Pulmonary Rehab  Referring Provider  Gollan      Initial Encounter Date:    Pulmonary Rehab from 11/19/2018 in [facility] Cardiac and Pulmonary Rehab  Date  11/19/18      Visit Diagnosis: Chronic obstructive pulmonary disease, unspecified COPD type ([place])  Patient's Home Medications on Admission:  Current Outpatient Medications:  .  acetaminophen (TYLENOL) 650 MG CR tablet, Take 650 mg by mouth every 8 (eight) hours as needed for pain., Disp: , Rfl:  .  albuterol (ACCUNEB) 0.63 MG/3ML nebulizer solution, Take 3 mLs (0.63 mg total) by nebulization every 6 (six) hours as needed for wheezing., Disp: 75 mL, Rfl: 12 .  citalopram (CELEXA) 20 MG tablet, Take 1 tablet (20 mg total) by mouth daily., Disp: 90 tablet, Rfl: 1 .  Coenzyme Q10 (COQ10) 30 MG CAPS, Take by mouth., Disp: , Rfl:  .  losartan (COZAAR) 50 MG tablet, TAKE 1 TABLET BY MOUTH ONCE A DAY, Disp: 90 tablet, Rfl: 1 .  PROAIR HFA 108 (90 Base) MCG/ACT inhaler, INL 2 PFS ITL Q 6 H PRF WHZ OR SOB, Disp: , Rfl:  .  rosuvastatin (CRESTOR) 5 MG tablet, Take 1 tablet (5 mg total) by mouth daily., Disp: 90 tablet, Rfl: 1 .  warfarin (COUMADIN) 5 MG tablet, Take 5 mg on Wednesday and Sunday, 2.5 mg all other days., Disp: 30 tablet, Rfl: 1  Past Medical History: Past Medical History:  Diagnosis Date  . Allergy   . Asthma   . Chicken pox   . History of blood clots   . Hypercholesterolemia   . Hypertension     Tobacco Use: Social History   Tobacco Use  Smoking Status Never Smoker  Smokeless Tobacco Never Used    Labs: Recent Review Flowsheet Data    Labs for ITP Cardiac and Pulmonary Rehab Latest Ref Rng & Units 04/10/2017 11/13/2017 07/01/2018 07/09/2018 11/19/2018   Cholestrol 0 - 200 mg/dL 156 248(H) - 187 162   LDLCALC 0 - 99 mg/dL 72 - -  104(H) 77   LDLDIRECT mg/dL - 174.0 - - -   HDL >39.00 mg/dL 52.00 55.50 - 57.30 56.10   Trlycerides 0.0 - 149.0 mg/dL 162.0(H) 245.0(H) - 127.0 144.0   Hemoglobin A1c 4.6 - 6.5 % 6.0 6.0 6.1 - 5.8       Pulmonary Assessment Scores: Pulmonary Assessment Scores    Row Name 11/19/18 1336         ADL UCSD   ADL Phase  Entry     SOB Score total  86     Rest  0     Walk  3     Stairs  5     Bath  5     Dress  5     Shop  5       CAT Score   CAT Score  21       mMRC Score   mMRC Score  4        UCSD: [place]-administered rating of dyspnea associated with activities of daily living (ADLs) 6-point scale (0 = "not at all" to 5 = "maximal or unable to do because of breathlessness")  Scoring Scores range from 0 to 120.  Minimally important difference is 5 units  CAT: CAT can identify the health impairment of  COPD patients and is better correlated with disease progression.  CAT has a scoring range of zero to 40. The CAT score is classified into four groups of low (less than 10), medium (10 - 20), high (21-30) and very high (31-40) based on the impact level of disease on health status. A CAT score over 10 suggests significant symptoms.  A worsening CAT score could be explained by an exacerbation, poor medication adherence, poor inhaler technique, or progression of COPD or comorbid conditions.  CAT MCID is 2 points  mMRC: mMRC (Modified Medical Research Council) Dyspnea Scale is used to assess the degree of baseline functional disability in patients of respiratory disease due to dyspnea. No minimal important difference is established. A decrease in score of 1 point or greater is considered a positive change.   Pulmonary Function Assessment: Pulmonary Function Assessment - 11/19/18 1425      Breath   Bilateral Breath Sounds  Clear;Decreased    Shortness of Breath  No;Limiting activity       Exercise Target Goals: Exercise Program Goal: Individual exercise prescription set using  results from initial 6 min walk test and THRR while considering  patient's activity barriers and safety.   Exercise Prescription Goal: Initial exercise prescription builds to 30-45 minutes a day of aerobic activity, 2-3 days per week.  Home exercise guidelines will be given to patient during program as part of exercise prescription that the participant will acknowledge.  Activity Barriers & Risk Stratification:   6 Minute Walk: 6 Minute Walk    Row Name 11/19/18 1557         6 Minute Walk   Phase  Initial     Distance  200 feet     Walk Time  2.25 minutes     # of Rest Breaks  1     MPH  1.01     METS  1     RPE  13     Perceived Dyspnea   4     VO2 Peak  0.5     Symptoms  Yes (comment)     Comments  hip and knee pain     Resting HR  74 bpm     Resting BP  128/70     Resting Oxygen Saturation   93 %     Exercise Oxygen Saturation  during 6 min walk  92 %     Max Ex. HR  113 bpm     Max Ex. BP  142/76     2 Minute Post BP  124/76       Interval HR   1 Minute HR  106     2 Minute HR  113     3 Minute HR  94     4 Minute HR  96     Interval Heart Rate?  Yes       Interval Oxygen   Interval Oxygen?  Yes     Baseline Oxygen Saturation %  93 %     1 Minute Oxygen Saturation %  92 %     2 Minute Oxygen Saturation %  92 %     3 Minute Oxygen Saturation %  93 %     4 Minute Oxygen Saturation %  93 %       Oxygen Initial Assessment: Oxygen Initial Assessment - 11/19/18 1335      Home Oxygen   Home Oxygen Device  Home Concentrator;E-Tanks    Sleep Oxygen Prescription  Continuous    Liters per minute  1    Home Exercise Oxygen Prescription  None    Home at Rest Exercise Oxygen Prescription  None    Compliance with Home Oxygen Use  Yes      Initial 6 min Walk   Oxygen Used  None      Program Oxygen Prescription   Program Oxygen Prescription  None      Intervention   Short Term Goals  To learn and demonstrate proper use of respiratory medications;To learn and  demonstrate proper pursed lip breathing techniques or other breathing techniques.;To learn and understand importance of maintaining oxygen saturations>88%;To learn and understand importance of monitoring SPO2 with pulse oximeter and demonstrate accurate use of the pulse oximeter.;To learn and exhibit compliance with exercise, home and travel O2 prescription    Long  Term Goals  Demonstrates proper use of MDI's;Compliance with respiratory medication;Exhibits proper breathing techniques, such as pursed lip breathing or other method taught during program session;Maintenance of O2 saturations>88%;Verbalizes importance of monitoring SPO2 with pulse oximeter and return demonstration;Exhibits compliance with exercise, home and travel O2 prescription       Oxygen Re-Evaluation:   Oxygen Discharge (Final Oxygen Re-Evaluation):   Initial Exercise Prescription: Initial Exercise Prescription - 11/19/18 1500      Date of Initial Exercise RX and Referring Provider   Date  11/19/18    Referring Provider  Gollan      Treadmill   MPH  0.5    Grade  0    Minutes  15   walk for 60 sec then rest   METs  1      Recumbant Bike   Level  1    RPM  60    Minutes  15    METs  1      NuStep   Level  1    SPM  80    Minutes  15    METs  1      T5 Nustep   Level  1    SPM  80    Minutes  15    METs  1      Biostep-RELP   Level  1    SPM  50    Minutes  15    METs  1      Prescription Details   Frequency (times per week)  3    Duration  Progress to 30 minutes of continuous aerobic without signs/symptoms of physical distress      Intensity   THRR 40-80% of Max Heartrate  102-130    Ratings of Perceived Exertion  11-15    Perceived Dyspnea  0-4      Resistance Training   Training Prescription  Yes    Weight  3 lb    Reps  10-15       Perform Capillary Blood Glucose checks as needed.  Exercise Prescription Changes: Exercise Prescription Changes    Row Name 11/19/18 1600 11/27/18  1300           Response to Exercise   Blood Pressure (Admit)  128/70  134/76      Blood Pressure (Exercise)  142/76  140/72      Blood Pressure (Exit)  124/76  128/80      Heart Rate (Admit)  74 bpm  97 bpm      Heart Rate (Exercise)  113 bpm  98 bpm      Heart Rate (Exit)  96  bpm  91 bpm      Oxygen Saturation (Admit)  93 %  94 %      Oxygen Saturation (Exercise)  92 %  90 %      Oxygen Saturation (Exit)  93 %  92 %      Rating of Perceived Exertion (Exercise)  13  17      Perceived Dyspnea (Exercise)  4  0      Symptoms  hip and knee pain  SOB, pain on TM      Comments  -  first full day of exercise      Duration  -  Progress to 30 minutes of  aerobic without signs/symptoms of physical distress      Intensity  -  THRR unchanged        Progression   Progression  -  Continue to progress workloads to maintain intensity without signs/symptoms of physical distress.      Average METs  -  1.7        Resistance Training   Training Prescription  -  Yes      Weight  -  ROM      Reps  -  10-15        Interval Training   Interval Training  -  No        Treadmill   MPH  -  0.5      Grade  -  0      Minutes  -  2      METs  -  1.5        T5 Nustep   Level  -  1      Minutes  -  15      METs  -  1.8         Exercise Comments: Exercise Comments    Row Name 11/20/18 0756           Exercise Comments  First full day of exercise!  Patient was oriented to gym and equipment including functions, settings, policies, and procedures.  Patient's individual exercise prescription and treatment plan were reviewed.  All starting workloads were established based on the results of the 6 minute walk test done at initial orientation visit.  The plan for exercise progression was also introduced and progression will be customized based on patient's performance and goals.          Exercise Goals and Review: Exercise Goals    Row Name 11/19/18 1602             Exercise Goals   Increase  Physical Activity  Yes       Intervention  Provide advice, education, support and counseling about physical activity/exercise needs.;Develop an individualized exercise prescription for aerobic and resistive training based on initial evaluation findings, risk stratification, comorbidities and participant's personal goals.       Expected Outcomes  Short Term: Attend rehab on a regular basis to increase amount of physical activity.;Long Term: Add in home exercise to make exercise part of routine and to increase amount of physical activity.;Long Term: Exercising regularly at least 3-5 days a week.       Increase Strength and Stamina  Yes       Intervention  Provide advice, education, support and counseling about physical activity/exercise needs.;Develop an individualized exercise prescription for aerobic and resistive training based on initial evaluation findings, risk stratification, comorbidities and participant's personal goals.       Expected Outcomes  Short Term: Increase workloads from initial exercise prescription for resistance, speed, and METs.;Short Term: Perform resistance training exercises routinely during rehab and add in resistance training at home;Long Term: Improve cardiorespiratory fitness, muscular endurance and strength as measured by increased METs and functional capacity (6MWT)       Able to understand and use rate of perceived exertion (RPE) scale  Yes       Intervention  Provide education and explanation on how to use RPE scale       Expected Outcomes  Short Term: Able to use RPE daily in rehab to express subjective intensity level;Long Term:  Able to use RPE to guide intensity level when exercising independently       Able to understand and use Dyspnea scale  Yes       Intervention  Provide education and explanation on how to use Dyspnea scale       Expected Outcomes  Short Term: Able to use Dyspnea scale daily in rehab to express subjective sense of shortness of breath during  exertion;Long Term: Able to use Dyspnea scale to guide intensity level when exercising independently       Knowledge and understanding of Target Heart Rate Range (THRR)  Yes       Intervention  Provide education and explanation of THRR including how the numbers were predicted and where they are located for reference       Expected Outcomes  Short Term: Able to state/look up THRR;Short Term: Able to use daily as guideline for intensity in rehab;Long Term: Able to use THRR to govern intensity when exercising independently       Able to check pulse independently  Yes       Intervention  Provide education and demonstration on how to check pulse in carotid and radial arteries.;Review the importance of being able to check your own pulse for safety during independent exercise       Expected Outcomes  Short Term: Able to explain why pulse checking is important during independent exercise;Long Term: Able to check pulse independently and accurately       Understanding of Exercise Prescription  Yes       Intervention  Provide education, explanation, and written materials on patient's individual exercise prescription       Expected Outcomes  Short Term: Able to explain program exercise prescription;Long Term: Able to explain home exercise prescription to exercise independently          Exercise Goals Re-Evaluation : Exercise Goals Re-Evaluation    Row Name 11/20/18 0758 11/27/18 1353 12/11/18 1322 12/23/18 1310       Exercise Goal Re-Evaluation   Exercise Goals Review  Increase Physical Activity;Able to understand and use rate of perceived exertion (RPE) scale;Understanding of Exercise Prescription;Able to understand and use Dyspnea scale;Increase Strength and Stamina;Knowledge and understanding of Target Heart Rate Range (THRR)  -  -  -    Comments  Reviewed RPE scale, THR and program prescription with pt today.  Pt voiced understanding and was given a copy of goals to take home.  Out since first visit.   Vacation this week.  Out since last review due to fall.  Out since last review.    Expected Outcomes  Short: Use RPE daily to regulate intensity. Long: Follow program prescription in THR.  -  -  -       Discharge Exercise Prescription (Final Exercise Prescription Changes): Exercise Prescription Changes - 11/27/18 1300      Response to  Exercise   Blood Pressure (Admit)  134/76    Blood Pressure (Exercise)  140/72    Blood Pressure (Exit)  128/80    Heart Rate (Admit)  97 bpm    Heart Rate (Exercise)  98 bpm    Heart Rate (Exit)  91 bpm    Oxygen Saturation (Admit)  94 %    Oxygen Saturation (Exercise)  90 %    Oxygen Saturation (Exit)  92 %    Rating of Perceived Exertion (Exercise)  17    Perceived Dyspnea (Exercise)  0    Symptoms  SOB, pain on TM    Comments  first full day of exercise    Duration  Progress to 30 minutes of  aerobic without signs/symptoms of physical distress    Intensity  THRR unchanged      Progression   Progression  Continue to progress workloads to maintain intensity without signs/symptoms of physical distress.    Average METs  1.7      Resistance Training   Training Prescription  Yes    Weight  ROM    Reps  10-15      Interval Training   Interval Training  No      Treadmill   MPH  0.5    Grade  0    Minutes  2    METs  1.5      T5 Nustep   Level  1    Minutes  15    METs  1.8       Nutrition:  Target Goals: Understanding of nutrition guidelines, daily intake of sodium '1500mg'$ , cholesterol '200mg'$ , calories 30% from fat and 7% or less from saturated fats, daily to have 5 or more servings of fruits and vegetables.  Biometrics: Pre Biometrics - 11/19/18 1603      Pre Biometrics   Height  '5\' 4"'$  (1.626 m)    Weight  240 lb 9.6 oz (109.1 kg)    BMI (Calculated)  41.28        Nutrition Therapy Plan and Nutrition Goals:   Nutrition Assessments: Nutrition Assessments - 11/19/18 1435      MEDFICTS Scores   Pre Score  29        Nutrition Goals Re-Evaluation:   Nutrition Goals Discharge (Final Nutrition Goals Re-Evaluation):   Psychosocial: Target Goals: Acknowledge presence or absence of significant depression and/or stress, maximize coping skills, provide positive support system. Participant is able to verbalize types and ability to use techniques and skills needed for reducing stress and depression.   Initial Review & Psychosocial Screening: Initial Psych Review & Screening - 11/19/18 1426      Initial Review   Current issues with  History of Depression;Current Psychotropic Meds;Current Depression      Family Dynamics   Good Support System?  Yes    Comments  She can look to her two children for support.      Barriers   Psychosocial barriers to participate in program  The patient should benefit from training in stress management and relaxation.      Screening Interventions   Interventions  Provide feedback about the scores to participant;To provide support and resources with identified psychosocial needs;Encouraged to exercise    Expected Outcomes  Short Term goal: Utilizing psychosocial counselor, staff and physician to assist with identification of specific Stressors or current issues interfering with healing process. Setting desired goal for each stressor or current issue identified.;Long Term Goal: Stressors or current issues are controlled or  eliminated.;Short Term goal: Identification and review with participant of any Quality of Life or Depression concerns found by scoring the questionnaire.;Long Term goal: The participant improves quality of Life and PHQ9 Scores as seen by post scores and/or verbalization of changes       Quality of Life Scores:  Scores of 19 and below usually indicate a poorer quality of life in these areas.  A difference of  2-3 points is a clinically meaningful difference.  A difference of 2-3 points in the total score of the Quality of Life Index has been associated with  significant improvement in overall quality of life, [place]-image, physical symptoms, and general health in studies assessing change in quality of life.  PHQ-9: Recent Review Flowsheet Data    Depression screen [facility] 2/9 11/19/2018 08/19/2018 08/14/2017 12/21/2015 09/15/2014   Decreased Interest 1 0 0 0 0   Down, Depressed, Hopeless 0 0 0 0 0   PHQ - 2 Score 1 0 0 0 0   Altered sleeping 1 - - - -   Tired, decreased energy 1 - - - -   Change in appetite 0 - - - -   Feeling bad or failure about yourself  3 - - - -   Trouble concentrating 0 - - - -   Moving slowly or fidgety/restless 0 - - - -   Suicidal thoughts 0 - - - -   PHQ-9 Score 6 - - - -   Difficult doing work/chores Very difficult - - - -     Interpretation of Total Score  Total Score Depression Severity:  1-4 = Minimal depression, 5-9 = Mild depression, 10-14 = Moderate depression, 15-19 = Moderately severe depression, 20-27 = Severe depression   Psychosocial Evaluation and Intervention:   Psychosocial Re-Evaluation:   Psychosocial Discharge (Final Psychosocial Re-Evaluation):   Education: Education Goals: Education classes will be provided on a weekly basis, covering required topics. Participant will state understanding/return demonstration of topics presented.  Learning Barriers/Preferences: Learning Barriers/Preferences - 11/19/18 1427      Learning Barriers/Preferences   Learning Barriers  None    Learning Preferences  None       Education Topics:  Initial Evaluation Education: - Verbal, written and demonstration of respiratory meds, oximetry and breathing techniques. Instruction on use of nebulizers and MDIs and importance of monitoring MDI activations.   Cardiac Rehab from 11/20/2018 in [facility] Cardiac and Pulmonary Rehab  Date  11/19/18  Educator  [facility]  Instruction Review Code  1- Verbalizes Understanding      General Nutrition Guidelines/Fats and Fiber: -Group instruction provided by verbal, written  material, models and posters to present the general guidelines for heart healthy nutrition. Gives an explanation and review of dietary fats and fiber.   Controlling Sodium/Reading Food Labels: -Group verbal and written material supporting the discussion of sodium use in heart healthy nutrition. Review and explanation with models, verbal and written materials for utilization of the food label.   Exercise Physiology & General Exercise Guidelines: - Group verbal and written instruction with models to review the exercise physiology of the cardiovascular system and associated critical values. Provides general exercise guidelines with specific guidelines to those with heart or lung disease.    Aerobic Exercise & Resistance Training: - Gives group verbal and written instruction on the various components of exercise. Focuses on aerobic and resistive training programs and the benefits of this training and how to safely progress through these programs.   Cardiac Rehab from 11/20/2018 in [facility]  Cardiac and Pulmonary Rehab  Date  11/20/18  Educator  [facility]  Instruction Review Code  1- Verbalizes Understanding      Flexibility, Balance, Mind/Body Relaxation: Provides group verbal/written instruction on the benefits of flexibility and balance training, including mind/body exercise modes such as yoga, pilates and tai chi.  Demonstration and skill practice provided.   Stress and Anxiety: - Provides group verbal and written instruction about the health risks of elevated stress and causes of high stress.  Discuss the correlation between heart/lung disease and anxiety and treatment options. Review healthy ways to manage with stress and anxiety.   Depression: - Provides group verbal and written instruction on the correlation between heart/lung disease and depressed mood, treatment options, and the stigmas associated with seeking treatment.   Exercise & Equipment Safety: - Individual verbal instruction and  demonstration of equipment use and safety with use of the equipment.   Cardiac Rehab from 11/20/2018 in [facility] Cardiac and Pulmonary Rehab  Date  11/19/18  Educator  [facility]  Instruction Review Code  1- Verbalizes Understanding      Infection Prevention: - Provides verbal and written material to individual with discussion of infection control including proper hand washing and proper equipment cleaning during exercise session.   Cardiac Rehab from 11/20/2018 in [facility] Cardiac and Pulmonary Rehab  Date  11/19/18  Educator  [facility]  Instruction Review Code  1- Verbalizes Understanding      Falls Prevention: - Provides verbal and written material to individual with discussion of falls prevention and safety.   Cardiac Rehab from 11/20/2018 in [facility] Cardiac and Pulmonary Rehab  Date  11/19/18  Educator  [facility]  Instruction Review Code  1- Verbalizes Understanding      Diabetes: - Individual verbal and written instruction to review signs/symptoms of diabetes, desired ranges of glucose level fasting, after meals and with exercise. Advice that pre and post exercise glucose checks will be done for 3 sessions at entry of program.   Chronic Lung Diseases: - Group verbal and written instruction to review updates, respiratory medications, advancements in procedures and treatments. Discuss use of supplemental oxygen including available portable oxygen systems, continuous and intermittent flow rates, concentrators, personal use and safety guidelines. Review proper use of inhaler and spacers. Provide informative websites for [place]-education.    Energy Conservation: - Provide group verbal and written instruction for methods to conserve energy, plan and organize activities. Instruct on pacing techniques, use of adaptive equipment and posture/positioning to relieve shortness of breath.   Triggers and Exacerbations: - Group verbal and written instruction to review types of environmental triggers and ways to prevent  exacerbations. Discuss weather changes, air quality and the benefits of nasal washing. Review warning signs and symptoms to help prevent infections. Discuss techniques for effective airway clearance, coughing, and vibrations.   AED/CPR: - Group verbal and written instruction with the use of models to demonstrate the basic use of the AED with the basic ABC's of resuscitation.   Anatomy and Physiology of the Lungs: - Group verbal and written instruction with the use of models to provide basic lung anatomy and physiology related to function, structure and complications of lung disease.   Anatomy & Physiology of the Heart: - Group verbal and written instruction and models provide basic cardiac anatomy and physiology, with the coronary electrical and arterial systems. Review of Valvular disease and Heart Failure   Cardiac Medications: - Group verbal and written instruction to review commonly prescribed medications for heart disease. Reviews the medication, class  of the drug, and side effects.   Know Your Numbers and Risk Factors: -Group verbal and written instruction about important numbers in your health.  Discussion of what are risk factors and how they play a role in the disease process.  Review of Cholesterol, Blood Pressure, Diabetes, and BMI and the role they play in your overall health.   Cardiac Rehab from 11/20/2018 in [facility] Cardiac and Pulmonary Rehab  Date  11/20/18  Educator  SB  Instruction Review Code  1- Verbalizes Understanding      Sleep Hygiene: -Provides group verbal and written instruction about how sleep can affect your health.  Define sleep hygiene, discuss sleep cycles and impact of sleep habits. Review good sleep hygiene tips.    Other: -Provides group and verbal instruction on various topics (see comments)    Knowledge Questionnaire Score: Knowledge Questionnaire Score - 11/19/18 1414      Knowledge Questionnaire Score   Pre Score  13/18   Reviewed with  patient       Core Components/Risk Factors/Patient Goals at Admission: Personal Goals and Risk Factors at Admission - 11/19/18 1611      Core Components/Risk Factors/Patient Goals on Admission    Weight Management  Yes;Obesity    Intervention  Weight Management/Obesity: Establish reasonable short term and long term weight goals.;Weight Management: Provide education and appropriate resources to help participant work on and attain dietary goals.;Weight Management: Develop a combined nutrition and exercise program designed to reach desired caloric intake, while maintaining appropriate intake of nutrient and fiber, sodium and fats, and appropriate energy expenditure required for the weight goal.;Obesity: Provide education and appropriate resources to help participant work on and attain dietary goals.    Admit Weight  240 lb 9.6 oz (109.1 kg)    Expected Outcomes  Short Term: Continue to assess and modify interventions until short term weight is achieved;Long Term: Adherence to nutrition and physical activity/exercise program aimed toward attainment of established weight goal;Weight Loss: Understanding of general recommendations for a balanced deficit meal plan, which promotes 1-2 lb weight loss per week and includes a negative energy balance of [phone] kcal/d;Understanding recommendations for meals to include 15-35% energy as protein, 25-35% energy from fat, 35-60% energy from carbohydrates, less than '200mg'$  of dietary cholesterol, 20-35 gm of total fiber daily;Understanding of distribution of calorie intake throughout the day with the consumption of 4-5 meals/snacks    Improve shortness of breath with ADL's  Yes    Intervention  Provide education, individualized exercise plan and daily activity instruction to help decrease symptoms of SOB with activities of daily living.    Expected Outcomes  Short Term: Improve cardiorespiratory fitness to achieve a reduction of symptoms when performing ADLs;Long Term:  Be able to perform more ADLs without symptoms or delay the onset of symptoms    Intervention  Provide education on lifestyle modifcations including regular physical activity/exercise, weight management, moderate sodium restriction and increased consumption of fresh fruit, vegetables, and low fat dairy, alcohol moderation, and smoking cessation.;Monitor prescription use compliance.    Expected Outcomes  Long Term: Maintenance of blood pressure at goal levels.;Short Term: Continued assessment and intervention until BP is < 140/69m HG in hypertensive participants. < 130/823mHG in hypertensive participants with diabetes, heart failure or chronic kidney disease.    Intervention  Provide education and support for participant on nutrition & aerobic/resistive exercise along with prescribed medications to achieve LDL '70mg'$ , HDL >'40mg'$ .    Expected Outcomes  Short Term: Participant states understanding of desired  cholesterol values and is compliant with medications prescribed. Participant is following exercise prescription and nutrition guidelines.;Long Term: Cholesterol controlled with medications as prescribed, with individualized exercise RX and with personalized nutrition plan. Value goals: LDL < '70mg'$ , HDL > 40 mg.       Core Components/Risk Factors/Patient Goals Review:    Core Components/Risk Factors/Patient Goals at Discharge (Final Review):    ITP Comments: ITP Comments    Row Name 12/04/18 0703 12/11/18 1321 12/23/18 1310 12/31/18 1150 01/01/19 1044   ITP Comments  30 day review completed. Continue with ITP sent to Dr. Emily Filbert, Medical Director of Cardiac and Pulmonary Rehab for review , changes as needed and signature. Last visit 10/21  Tina Duarte fell and hurt knee. Out since 10/21.  Called to check on pt.  She has been out since 11/20/18.  Left message.  Called to check on pt.  Out since 11/20/18. Left message.  Sent letter.  30 day review competed . ITP sent to Dr Emily Filbert for review, changes  as needed and ITP approval signature.      Comments:

## 2019-01-02 ENCOUNTER — Ambulatory Visit: Payer: Medicare Other

## 2019-01-06 ENCOUNTER — Ambulatory Visit: Payer: Medicare Other

## 2019-01-07 ENCOUNTER — Other Ambulatory Visit: Payer: Medicare Other

## 2019-01-08 ENCOUNTER — Ambulatory Visit: Payer: Medicare Other

## 2019-01-09 ENCOUNTER — Ambulatory Visit: Payer: Medicare Other

## 2019-01-10 DIAGNOSIS — J449 Chronic obstructive pulmonary disease, unspecified: Secondary | ICD-10-CM | POA: Diagnosis not present

## 2019-01-10 NOTE — Progress Notes (Signed)
Discharge Progress Report  Patient Details  Name: Tina Duarte MRN: [id number] Date of Birth: [birth year] Referring Provider:     Pulmonary Rehab from 11/19/2018 in [facility] Cardiac and Pulmonary Rehab  Referring Provider  Gollan       Number of Visits: 2  Reason for Discharge:  Early Exit:  Lack of attendance  Smoking History:  Social History   Tobacco Use  Smoking Status Never Smoker  Smokeless Tobacco Never Used    Diagnosis:  No diagnosis found.  ADL UCSD: Pulmonary Assessment Scores    Row Name 11/19/18 1336         ADL UCSD   ADL Phase  Entry     SOB Score total  86     Rest  0     Walk  3     Stairs  5     Bath  5     Dress  5     Shop  5       CAT Score   CAT Score  21       mMRC Score   mMRC Score  4        Initial Exercise Prescription: Initial Exercise Prescription - 11/19/18 1500      Date of Initial Exercise RX and Referring Provider   Date  11/19/18    Referring Provider  Gollan      Treadmill   MPH  0.5    Grade  0    Minutes  15   walk for 60 sec then rest   METs  1      Recumbant Bike   Level  1    RPM  60    Minutes  15    METs  1      NuStep   Level  1    SPM  80    Minutes  15    METs  1      T5 Nustep   Level  1    SPM  80    Minutes  15    METs  1      Biostep-RELP   Level  1    SPM  50    Minutes  15    METs  1      Prescription Details   Frequency (times per week)  3    Duration  Progress to 30 minutes of continuous aerobic without signs/symptoms of physical distress      Intensity   THRR 40-80% of Max Heartrate  102-130    Ratings of Perceived Exertion  11-15    Perceived Dyspnea  0-4      Resistance Training   Training Prescription  Yes    Weight  3 lb    Reps  10-15       Discharge Exercise Prescription (Final Exercise Prescription Changes): Exercise Prescription Changes - 11/27/18 1300      Response to Exercise   Blood Pressure (Admit)  134/76    Blood Pressure (Exercise)  140/72     Blood Pressure (Exit)  128/80    Heart Rate (Admit)  97 bpm    Heart Rate (Exercise)  98 bpm    Heart Rate (Exit)  91 bpm    Oxygen Saturation (Admit)  94 %    Oxygen Saturation (Exercise)  90 %    Oxygen Saturation (Exit)  92 %    Rating of Perceived Exertion (Exercise)  17    Perceived Dyspnea (Exercise)  0    Symptoms  SOB, pain on TM    Comments  first full day of exercise    Duration  Progress to 30 minutes of  aerobic without signs/symptoms of physical distress    Intensity  THRR unchanged      Progression   Progression  Continue to progress workloads to maintain intensity without signs/symptoms of physical distress.    Average METs  1.7      Resistance Training   Training Prescription  Yes    Weight  ROM    Reps  10-15      Interval Training   Interval Training  No      Treadmill   MPH  0.5    Grade  0    Minutes  2    METs  1.5      T5 Nustep   Level  1    Minutes  15    METs  1.8       Functional Capacity: 6 Minute Walk    Row Name 11/19/18 1557         6 Minute Walk   Phase  Initial     Distance  200 feet     Walk Time  2.25 minutes     # of Rest Breaks  1     MPH  1.01     METS  1     RPE  13     Perceived Dyspnea   4     VO2 Peak  0.5     Symptoms  Yes (comment)     Comments  hip and knee pain     Resting HR  74 bpm     Resting BP  128/70     Resting Oxygen Saturation   93 %     Exercise Oxygen Saturation  during 6 min walk  92 %     Max Ex. HR  113 bpm     Max Ex. BP  142/76     2 Minute Post BP  124/76       Interval HR   1 Minute HR  106     2 Minute HR  113     3 Minute HR  94     4 Minute HR  96     Interval Heart Rate?  Yes       Interval Oxygen   Interval Oxygen?  Yes     Baseline Oxygen Saturation %  93 %     1 Minute Oxygen Saturation %  92 %     2 Minute Oxygen Saturation %  92 %     3 Minute Oxygen Saturation %  93 %     4 Minute Oxygen Saturation %  93 %        Psychological, QOL, Others - Outcomes: PHQ  2/9: Depression screen [facility] 2/9 11/19/2018 08/19/2018 08/14/2017 12/21/2015 09/15/2014  Decreased Interest 1 0 0 0 0  Down, Depressed, Hopeless 0 0 0 0 0  PHQ - 2 Score 1 0 0 0 0  Altered sleeping 1 - - - -  Tired, decreased energy 1 - - - -  Change in appetite 0 - - - -  Feeling bad or failure about yourself  3 - - - -  Trouble concentrating 0 - - - -  Moving slowly or fidgety/restless 0 - - - -  Suicidal thoughts 0 - - - -  PHQ-9 Score 6 - - - -  Difficult doing work/chores Very difficult - - - -  Some recent data might be hidden    Quality of Life:   Personal Goals: Goals established at orientation with interventions provided to work toward goal. Personal Goals and Risk Factors at Admission - 11/19/18 1611      Core Components/Risk Factors/Patient Goals on Admission    Weight Management  Yes;Obesity    Intervention  Weight Management/Obesity: Establish reasonable short term and long term weight goals.;Weight Management: Provide education and appropriate resources to help participant work on and attain dietary goals.;Weight Management: Develop a combined nutrition and exercise program designed to reach desired caloric intake, while maintaining appropriate intake of nutrient and fiber, sodium and fats, and appropriate energy expenditure required for the weight goal.;Obesity: Provide education and appropriate resources to help participant work on and attain dietary goals.    Admit Weight  240 lb 9.6 oz (109.1 kg)    Expected Outcomes  Short Term: Continue to assess and modify interventions until short term weight is achieved;Long Term: Adherence to nutrition and physical activity/exercise program aimed toward attainment of established weight goal;Weight Loss: Understanding of general recommendations for a balanced deficit meal plan, which promotes 1-2 lb weight loss per week and includes a negative energy balance of [phone] kcal/d;Understanding recommendations for meals to include 15-35%  energy as protein, 25-35% energy from fat, 35-60% energy from carbohydrates, less than 200mg  of dietary cholesterol, 20-35 gm of total fiber daily;Understanding of distribution of calorie intake throughout the day with the consumption of 4-5 meals/snacks    Improve shortness of breath with ADL's  Yes    Intervention  Provide education, individualized exercise plan and daily activity instruction to help decrease symptoms of SOB with activities of daily living.    Expected Outcomes  Short Term: Improve cardiorespiratory fitness to achieve a reduction of symptoms when performing ADLs;Long Term: Be able to perform more ADLs without symptoms or delay the onset of symptoms    Intervention  Provide education on lifestyle modifcations including regular physical activity/exercise, weight management, moderate sodium restriction and increased consumption of fresh fruit, vegetables, and low fat dairy, alcohol moderation, and smoking cessation.;Monitor prescription use compliance.    Expected Outcomes  Long Term: Maintenance of blood pressure at goal levels.;Short Term: Continued assessment and intervention until BP is < 140/50mm HG in hypertensive participants. < 130/70mm HG in hypertensive participants with diabetes, heart failure or chronic kidney disease.    Intervention  Provide education and support for participant on nutrition & aerobic/resistive exercise along with prescribed medications to achieve LDL 70mg , HDL >40mg .    Expected Outcomes  Short Term: Participant states understanding of desired cholesterol values and is compliant with medications prescribed. Participant is following exercise prescription and nutrition guidelines.;Long Term: Cholesterol controlled with medications as prescribed, with individualized exercise RX and with personalized nutrition plan. Value goals: LDL < 70mg , HDL > 40 mg.        Personal Goals Discharge:   Exercise Goals and Review: Exercise Goals    Row Name 11/19/18 1602              Exercise Goals   Increase Physical Activity  Yes       Intervention  Provide advice, education, support and counseling about physical activity/exercise needs.;Develop an individualized exercise prescription for aerobic and resistive training based on initial evaluation findings, risk stratification, comorbidities and participant's personal goals.       Expected Outcomes  Short Term: Attend rehab on a regular basis to increase  amount of physical activity.;Long Term: Add in home exercise to make exercise part of routine and to increase amount of physical activity.;Long Term: Exercising regularly at least 3-5 days a week.       Increase Strength and Stamina  Yes       Intervention  Provide advice, education, support and counseling about physical activity/exercise needs.;Develop an individualized exercise prescription for aerobic and resistive training based on initial evaluation findings, risk stratification, comorbidities and participant's personal goals.       Expected Outcomes  Short Term: Increase workloads from initial exercise prescription for resistance, speed, and METs.;Short Term: Perform resistance training exercises routinely during rehab and add in resistance training at home;Long Term: Improve cardiorespiratory fitness, muscular endurance and strength as measured by increased METs and functional capacity (6MWT)       Able to understand and use rate of perceived exertion (RPE) scale  Yes       Intervention  Provide education and explanation on how to use RPE scale       Expected Outcomes  Short Term: Able to use RPE daily in rehab to express subjective intensity level;Long Term:  Able to use RPE to guide intensity level when exercising independently       Able to understand and use Dyspnea scale  Yes       Intervention  Provide education and explanation on how to use Dyspnea scale       Expected Outcomes  Short Term: Able to use Dyspnea scale daily in rehab to express  subjective sense of shortness of breath during exertion;Long Term: Able to use Dyspnea scale to guide intensity level when exercising independently       Knowledge and understanding of Target Heart Rate Range (THRR)  Yes       Intervention  Provide education and explanation of THRR including how the numbers were predicted and where they are located for reference       Expected Outcomes  Short Term: Able to state/look up THRR;Short Term: Able to use daily as guideline for intensity in rehab;Long Term: Able to use THRR to govern intensity when exercising independently       Able to check pulse independently  Yes       Intervention  Provide education and demonstration on how to check pulse in carotid and radial arteries.;Review the importance of being able to check your own pulse for safety during independent exercise       Expected Outcomes  Short Term: Able to explain why pulse checking is important during independent exercise;Long Term: Able to check pulse independently and accurately       Understanding of Exercise Prescription  Yes       Intervention  Provide education, explanation, and written materials on patient's individual exercise prescription       Expected Outcomes  Short Term: Able to explain program exercise prescription;Long Term: Able to explain home exercise prescription to exercise independently          Exercise Goals Re-Evaluation: Exercise Goals Re-Evaluation    Row Name 11/20/18 0758 11/27/18 1353 12/11/18 1322 12/23/18 1310       Exercise Goal Re-Evaluation   Exercise Goals Review  Increase Physical Activity;Able to understand and use rate of perceived exertion (RPE) scale;Understanding of Exercise Prescription;Able to understand and use Dyspnea scale;Increase Strength and Stamina;Knowledge and understanding of Target Heart Rate Range (THRR)  --  --  --    Comments  Reviewed RPE scale, THR and program prescription with  pt today.  Pt voiced understanding and was given a copy  of goals to take home.  Out since first visit.  Vacation this week.  Out since last review due to fall.  Out since last review.    Expected Outcomes  Short: Use RPE daily to regulate intensity. Long: Follow program prescription in THR.  --  --  --       Nutrition & Weight - Outcomes: Pre Biometrics - 11/19/18 1603      Pre Biometrics   Height  5\' 4"  (1.626 m)    Weight  240 lb 9.6 oz (109.1 kg)    BMI (Calculated)  41.28        Nutrition:   Nutrition Discharge: Nutrition Assessments - 11/19/18 1435      MEDFICTS Scores   Pre Score  29       Education Questionnaire Score: Knowledge Questionnaire Score - 11/19/18 1414      Knowledge Questionnaire Score   Pre Score  13/18   Reviewed with patient      Goals reviewed with patient; copy given to patient.

## 2019-01-10 NOTE — Progress Notes (Signed)
Pulmonary Individual Treatment Plan  Patient Details  Name: Tina Duarte MRN: [id number] Date of Birth: 12/09/1945 Referring Provider:     Pulmonary Rehab from 11/19/2018 in [facility] Cardiac and Pulmonary Rehab  Referring Provider  Gollan      Initial Encounter Date:    Pulmonary Rehab from 11/19/2018 in [facility] Cardiac and Pulmonary Rehab  Date  11/19/18      Visit Diagnosis: No diagnosis found.  Patient's Home Medications on Admission:  Current Outpatient Medications:  .  acetaminophen (TYLENOL) 650 MG CR tablet, Take 650 mg by mouth every 8 (eight) hours as needed for pain., Disp: , Rfl:  .  albuterol (ACCUNEB) 0.63 MG/3ML nebulizer solution, Take 3 mLs (0.63 mg total) by nebulization every 6 (six) hours as needed for wheezing., Disp: 75 mL, Rfl: 12 .  citalopram (CELEXA) 20 MG tablet, Take 1 tablet (20 mg total) by mouth daily., Disp: 90 tablet, Rfl: 1 .  Coenzyme Q10 (COQ10) 30 MG CAPS, Take by mouth., Disp: , Rfl:  .  losartan (COZAAR) 50 MG tablet, TAKE 1 TABLET BY MOUTH ONCE A DAY, Disp: 90 tablet, Rfl: 1 .  PROAIR HFA 108 (90 Base) MCG/ACT inhaler, INL 2 PFS ITL Q 6 H PRF WHZ OR SOB, Disp: , Rfl:  .  rosuvastatin (CRESTOR) 5 MG tablet, Take 1 tablet (5 mg total) by mouth daily., Disp: 90 tablet, Rfl: 1 .  warfarin (COUMADIN) 5 MG tablet, Take 5 mg on Wednesday and Sunday, 2.5 mg all other days., Disp: 30 tablet, Rfl: 1  Past Medical History: Past Medical History:  Diagnosis Date  . Allergy   . Asthma   . Chicken pox   . History of blood clots   . Hypercholesterolemia   . Hypertension     Tobacco Use: Social History   Tobacco Use  Smoking Status Never Smoker  Smokeless Tobacco Never Used    Labs: Recent Review Flowsheet Data    Labs for ITP Cardiac and Pulmonary Rehab Latest Ref Rng & Units 04/10/2017 11/13/2017 07/01/2018 07/09/2018 11/19/2018   Cholestrol 0 - 200 mg/dL 156 248(H) - 187 162   LDLCALC 0 - 99 mg/dL 72 - - 104(H) 77   LDLDIRECT mg/dL - 174.0 - - -    HDL >39.00 mg/dL 52.00 55.50 - 57.30 56.10   Trlycerides 0.0 - 149.0 mg/dL 162.0(H) 245.0(H) - 127.0 144.0   Hemoglobin A1c 4.6 - 6.5 % 6.0 6.0 6.1 - 5.8       Pulmonary Assessment Scores: Pulmonary Assessment Scores    Row Name 11/19/18 1336         ADL UCSD   ADL Phase  Entry     SOB Score total  86     Rest  0     Walk  3     Stairs  5     Bath  5     Dress  5     Shop  5       CAT Score   CAT Score  21       mMRC Score   mMRC Score  4        UCSD: Morton-administered rating of dyspnea associated with activities of daily living (ADLs) 6-point scale (0 = "not at all" to 5 = "maximal or unable to do because of breathlessness")  Scoring Scores range from 0 to 120.  Minimally important difference is 5 units  CAT: CAT can identify the health impairment of COPD patients and is better  correlated with disease progression.  CAT has a scoring range of zero to 40. The CAT score is classified into four groups of low (less than 10), medium (10 - 20), high (21-30) and very high (31-40) based on the impact level of disease on health status. A CAT score over 10 suggests significant symptoms.  A worsening CAT score could be explained by an exacerbation, poor medication adherence, poor inhaler technique, or progression of COPD or comorbid conditions.  CAT MCID is 2 points  mMRC: mMRC (Modified Medical Research Council) Dyspnea Scale is used to assess the degree of baseline functional disability in patients of respiratory disease due to dyspnea. No minimal important difference is established. A decrease in score of 1 point or greater is considered a positive change.   Pulmonary Function Assessment: Pulmonary Function Assessment - 11/19/18 1425      Breath   Bilateral Breath Sounds  Clear;Decreased    Shortness of Breath  No;Limiting activity       Exercise Target Goals: Exercise Program Goal: Individual exercise prescription set using results from initial 6 min walk test and THRR  while considering  patient's activity barriers and safety.   Exercise Prescription Goal: Initial exercise prescription builds to 30-45 minutes a day of aerobic activity, 2-3 days per week.  Home exercise guidelines will be given to patient during program as part of exercise prescription that the participant will acknowledge.  Activity Barriers & Risk Stratification:   6 Minute Walk: 6 Minute Walk    Row Name 11/19/18 1557         6 Minute Walk   Phase  Initial     Distance  200 feet     Walk Time  2.25 minutes     # of Rest Breaks  1     MPH  1.01     METS  1     RPE  13     Perceived Dyspnea   4     VO2 Peak  0.5     Symptoms  Yes (comment)     Comments  hip and knee pain     Resting HR  74 bpm     Resting BP  128/70     Resting Oxygen Saturation   93 %     Exercise Oxygen Saturation  during 6 min walk  92 %     Max Ex. HR  113 bpm     Max Ex. BP  142/76     2 Minute Post BP  124/76       Interval HR   1 Minute HR  106     2 Minute HR  113     3 Minute HR  94     4 Minute HR  96     Interval Heart Rate?  Yes       Interval Oxygen   Interval Oxygen?  Yes     Baseline Oxygen Saturation %  93 %     1 Minute Oxygen Saturation %  92 %     2 Minute Oxygen Saturation %  92 %     3 Minute Oxygen Saturation %  93 %     4 Minute Oxygen Saturation %  93 %       Oxygen Initial Assessment: Oxygen Initial Assessment - 11/19/18 1335      Home Oxygen   Home Oxygen Device  Home Concentrator;E-Tanks    Sleep Oxygen Prescription  Continuous    Liters  per minute  1    Home Exercise Oxygen Prescription  None    Home at Rest Exercise Oxygen Prescription  None    Compliance with Home Oxygen Use  Yes      Initial 6 min Walk   Oxygen Used  None      Program Oxygen Prescription   Program Oxygen Prescription  None      Intervention   Short Term Goals  To learn and demonstrate proper use of respiratory medications;To learn and demonstrate proper pursed lip breathing  techniques or other breathing techniques.;To learn and understand importance of maintaining oxygen saturations>88%;To learn and understand importance of monitoring SPO2 with pulse oximeter and demonstrate accurate use of the pulse oximeter.;To learn and exhibit compliance with exercise, home and travel O2 prescription    Long  Term Goals  Demonstrates proper use of MDI's;Compliance with respiratory medication;Exhibits proper breathing techniques, such as pursed lip breathing or other method taught during program session;Maintenance of O2 saturations>88%;Verbalizes importance of monitoring SPO2 with pulse oximeter and return demonstration;Exhibits compliance with exercise, home and travel O2 prescription       Oxygen Re-Evaluation:   Oxygen Discharge (Final Oxygen Re-Evaluation):   Initial Exercise Prescription: Initial Exercise Prescription - 11/19/18 1500      Date of Initial Exercise RX and Referring Provider   Date  11/19/18    Referring Provider  Gollan      Treadmill   MPH  0.5    Grade  0    Minutes  15   walk for 60 sec then rest   METs  1      Recumbant Bike   Level  1    RPM  60    Minutes  15    METs  1      NuStep   Level  1    SPM  80    Minutes  15    METs  1      T5 Nustep   Level  1    SPM  80    Minutes  15    METs  1      Biostep-RELP   Level  1    SPM  50    Minutes  15    METs  1      Prescription Details   Frequency (times per week)  3    Duration  Progress to 30 minutes of continuous aerobic without signs/symptoms of physical distress      Intensity   THRR 40-80% of Max Heartrate  102-130    Ratings of Perceived Exertion  11-15    Perceived Dyspnea  0-4      Resistance Training   Training Prescription  Yes    Weight  3 lb    Reps  10-15       Perform Capillary Blood Glucose checks as needed.  Exercise Prescription Changes: Exercise Prescription Changes    Row Name 11/19/18 1600 11/27/18 1300           Response to  Exercise   Blood Pressure (Admit)  128/70  134/76      Blood Pressure (Exercise)  142/76  140/72      Blood Pressure (Exit)  124/76  128/80      Heart Rate (Admit)  74 bpm  97 bpm      Heart Rate (Exercise)  113 bpm  98 bpm      Heart Rate (Exit)  96 bpm  91 bpm  Oxygen Saturation (Admit)  93 %  94 %      Oxygen Saturation (Exercise)  92 %  90 %      Oxygen Saturation (Exit)  93 %  92 %      Rating of Perceived Exertion (Exercise)  13  17      Perceived Dyspnea (Exercise)  4  0      Symptoms  hip and knee pain  SOB, pain on TM      Comments  --  first full day of exercise      Duration  --  Progress to 30 minutes of  aerobic without signs/symptoms of physical distress      Intensity  --  THRR unchanged        Progression   Progression  --  Continue to progress workloads to maintain intensity without signs/symptoms of physical distress.      Average METs  --  1.7        Resistance Training   Training Prescription  --  Yes      Weight  --  ROM      Reps  --  10-15        Interval Training   Interval Training  --  No        Treadmill   MPH  --  0.5      Grade  --  0      Minutes  --  2      METs  --  1.5        T5 Nustep   Level  --  1      Minutes  --  15      METs  --  1.8         Exercise Comments: Exercise Comments    Row Name 11/20/18 0756           Exercise Comments  First full day of exercise!  Patient was oriented to gym and equipment including functions, settings, policies, and procedures.  Patient's individual exercise prescription and treatment plan were reviewed.  All starting workloads were established based on the results of the 6 minute walk test done at initial orientation visit.  The plan for exercise progression was also introduced and progression will be customized based on patient's performance and goals.          Exercise Goals and Review: Exercise Goals    Row Name 11/19/18 1602             Exercise Goals   Increase Physical  Activity  Yes       Intervention  Provide advice, education, support and counseling about physical activity/exercise needs.;Develop an individualized exercise prescription for aerobic and resistive training based on initial evaluation findings, risk stratification, comorbidities and participant's personal goals.       Expected Outcomes  Short Term: Attend rehab on a regular basis to increase amount of physical activity.;Long Term: Add in home exercise to make exercise part of routine and to increase amount of physical activity.;Long Term: Exercising regularly at least 3-5 days a week.       Increase Strength and Stamina  Yes       Intervention  Provide advice, education, support and counseling about physical activity/exercise needs.;Develop an individualized exercise prescription for aerobic and resistive training based on initial evaluation findings, risk stratification, comorbidities and participant's personal goals.       Expected Outcomes  Short Term: Increase workloads from initial exercise prescription  for resistance, speed, and METs.;Short Term: Perform resistance training exercises routinely during rehab and add in resistance training at home;Long Term: Improve cardiorespiratory fitness, muscular endurance and strength as measured by increased METs and functional capacity (6MWT)       Able to understand and use rate of perceived exertion (RPE) scale  Yes       Intervention  Provide education and explanation on how to use RPE scale       Expected Outcomes  Short Term: Able to use RPE daily in rehab to express subjective intensity level;Long Term:  Able to use RPE to guide intensity level when exercising independently       Able to understand and use Dyspnea scale  Yes       Intervention  Provide education and explanation on how to use Dyspnea scale       Expected Outcomes  Short Term: Able to use Dyspnea scale daily in rehab to express subjective sense of shortness of breath during exertion;Long  Term: Able to use Dyspnea scale to guide intensity level when exercising independently       Knowledge and understanding of Target Heart Rate Range (THRR)  Yes       Intervention  Provide education and explanation of THRR including how the numbers were predicted and where they are located for reference       Expected Outcomes  Short Term: Able to state/look up THRR;Short Term: Able to use daily as guideline for intensity in rehab;Long Term: Able to use THRR to govern intensity when exercising independently       Able to check pulse independently  Yes       Intervention  Provide education and demonstration on how to check pulse in carotid and radial arteries.;Review the importance of being able to check your own pulse for safety during independent exercise       Expected Outcomes  Short Term: Able to explain why pulse checking is important during independent exercise;Long Term: Able to check pulse independently and accurately       Understanding of Exercise Prescription  Yes       Intervention  Provide education, explanation, and written materials on patient's individual exercise prescription       Expected Outcomes  Short Term: Able to explain program exercise prescription;Long Term: Able to explain home exercise prescription to exercise independently          Exercise Goals Re-Evaluation : Exercise Goals Re-Evaluation    Row Name 11/20/18 0758 11/27/18 1353 12/11/18 1322 12/23/18 1310       Exercise Goal Re-Evaluation   Exercise Goals Review  Increase Physical Activity;Able to understand and use rate of perceived exertion (RPE) scale;Understanding of Exercise Prescription;Able to understand and use Dyspnea scale;Increase Strength and Stamina;Knowledge and understanding of Target Heart Rate Range (THRR)  --  --  --    Comments  Reviewed RPE scale, THR and program prescription with pt today.  Pt voiced understanding and was given a copy of goals to take home.  Out since first visit.  Vacation this  week.  Out since last review due to fall.  Out since last review.    Expected Outcomes  Short: Use RPE daily to regulate intensity. Long: Follow program prescription in [facility].  --  --  --       Discharge Exercise Prescription (Final Exercise Prescription Changes): Exercise Prescription Changes - 11/27/18 1300      Response to Exercise   Blood Pressure (Admit)  134/76  Blood Pressure (Exercise)  140/72    Blood Pressure (Exit)  128/80    Heart Rate (Admit)  97 bpm    Heart Rate (Exercise)  98 bpm    Heart Rate (Exit)  91 bpm    Oxygen Saturation (Admit)  94 %    Oxygen Saturation (Exercise)  90 %    Oxygen Saturation (Exit)  92 %    Rating of Perceived Exertion (Exercise)  17    Perceived Dyspnea (Exercise)  0    Symptoms  SOB, pain on TM    Comments  first full day of exercise    Duration  Progress to 30 minutes of  aerobic without signs/symptoms of physical distress    Intensity  THRR unchanged      Progression   Progression  Continue to progress workloads to maintain intensity without signs/symptoms of physical distress.    Average METs  1.7      Resistance Training   Training Prescription  Yes    Weight  ROM    Reps  10-15      Interval Training   Interval Training  No      Treadmill   MPH  0.5    Grade  0    Minutes  2    METs  1.5      T5 Nustep   Level  1    Minutes  15    METs  1.8       Nutrition:  Target Goals: Understanding of nutrition guidelines, daily intake of sodium <1573m, cholesterol <2075m calories 30% from fat and 7% or less from saturated fats, daily to have 5 or more servings of fruits and vegetables.  Biometrics: Pre Biometrics - 11/19/18 1603      Pre Biometrics   Height  '5\' 4"'  (1.626 m)    Weight  240 lb 9.6 oz (109.1 kg)    BMI (Calculated)  41.28        Nutrition Therapy Plan and Nutrition Goals:   Nutrition Assessments: Nutrition Assessments - 11/19/18 1435      MEDFICTS Scores   Pre Score  29       Nutrition  Goals Re-Evaluation:   Nutrition Goals Discharge (Final Nutrition Goals Re-Evaluation):   Psychosocial: Target Goals: Acknowledge presence or absence of significant depression and/or stress, maximize coping skills, provide positive support system. Participant is able to verbalize types and ability to use techniques and skills needed for reducing stress and depression.   Initial Review & Psychosocial Screening: Initial Psych Review & Screening - 11/19/18 1426      Initial Review   Current issues with  History of Depression;Current Psychotropic Meds;Current Depression      Family Dynamics   Good Support System?  Yes    Comments  She can look to her two children for support.      Barriers   Psychosocial barriers to participate in program  The patient should benefit from training in stress management and relaxation.      Screening Interventions   Interventions  Provide feedback about the scores to participant;To provide support and resources with identified psychosocial needs;Encouraged to exercise    Expected Outcomes  Short Term goal: Utilizing psychosocial counselor, staff and physician to assist with identification of specific Stressors or current issues interfering with healing process. Setting desired goal for each stressor or current issue identified.;Long Term Goal: Stressors or current issues are controlled or eliminated.;Short Term goal: Identification and review with participant of any Quality  of Life or Depression concerns found by scoring the questionnaire.;Long Term goal: The participant improves quality of Life and PHQ9 Scores as seen by post scores and/or verbalization of changes       Quality of Life Scores:  Scores of 19 and below usually indicate a poorer quality of life in these areas.  A difference of  2-3 points is a clinically meaningful difference.  A difference of 2-3 points in the total score of the Quality of Life Index has been associated with significant  improvement in overall quality of life, Hallgren-image, physical symptoms, and general health in studies assessing change in quality of life.  PHQ-9: Recent Review Flowsheet Data    Depression screen [facility] 2/9 11/19/2018 08/19/2018 08/14/2017 12/21/2015 09/15/2014   Decreased Interest 1 0 0 0 0   Down, Depressed, Hopeless 0 0 0 0 0   PHQ - 2 Score 1 0 0 0 0   Altered sleeping 1 - - - -   Tired, decreased energy 1 - - - -   Change in appetite 0 - - - -   Feeling bad or failure about yourself  3 - - - -   Trouble concentrating 0 - - - -   Moving slowly or fidgety/restless 0 - - - -   Suicidal thoughts 0 - - - -   PHQ-9 Score 6 - - - -   Difficult doing work/chores Very difficult - - - -     Interpretation of Total Score  Total Score Depression Severity:  1-4 = Minimal depression, 5-9 = Mild depression, 10-14 = Moderate depression, 15-19 = Moderately severe depression, 20-27 = Severe depression   Psychosocial Evaluation and Intervention:   Psychosocial Re-Evaluation:   Psychosocial Discharge (Final Psychosocial Re-Evaluation):   Education: Education Goals: Education classes will be provided on a weekly basis, covering required topics. Participant will state understanding/return demonstration of topics presented.  Learning Barriers/Preferences: Learning Barriers/Preferences - 11/19/18 1427      Learning Barriers/Preferences   Learning Barriers  None    Learning Preferences  None       Education Topics:  Initial Evaluation Education: - Verbal, written and demonstration of respiratory meds, oximetry and breathing techniques. Instruction on use of nebulizers and MDIs and importance of monitoring MDI activations.   Cardiac Rehab from 11/20/2018 in [facility] Cardiac and Pulmonary Rehab  Date  11/19/18  Educator  [facility]  Instruction Review Code  1- Verbalizes Understanding      General Nutrition Guidelines/Fats and Fiber: -Group instruction provided by verbal, written material, models and  posters to present the general guidelines for heart healthy nutrition. Gives an explanation and review of dietary fats and fiber.   Controlling Sodium/Reading Food Labels: -Group verbal and written material supporting the discussion of sodium use in heart healthy nutrition. Review and explanation with models, verbal and written materials for utilization of the food label.   Exercise Physiology & General Exercise Guidelines: - Group verbal and written instruction with models to review the exercise physiology of the cardiovascular system and associated critical values. Provides general exercise guidelines with specific guidelines to those with heart or lung disease.    Aerobic Exercise & Resistance Training: - Gives group verbal and written instruction on the various components of exercise. Focuses on aerobic and resistive training programs and the benefits of this training and how to safely progress through these programs.   Cardiac Rehab from 11/20/2018 in [facility] Cardiac and Pulmonary Rehab  Date  11/20/18  Educator  [facility]  Instruction Review Code  1- Verbalizes Understanding      Flexibility, Balance, Mind/Body Relaxation: Provides group verbal/written instruction on the benefits of flexibility and balance training, including mind/body exercise modes such as yoga, pilates and tai chi.  Demonstration and skill practice provided.   Stress and Anxiety: - Provides group verbal and written instruction about the health risks of elevated stress and causes of high stress.  Discuss the correlation between heart/lung disease and anxiety and treatment options. Review healthy ways to manage with stress and anxiety.   Depression: - Provides group verbal and written instruction on the correlation between heart/lung disease and depressed mood, treatment options, and the stigmas associated with seeking treatment.   Exercise & Equipment Safety: - Individual verbal instruction and demonstration of  equipment use and safety with use of the equipment.   Cardiac Rehab from 11/20/2018 in [facility] Cardiac and Pulmonary Rehab  Date  11/19/18  Educator  [facility]  Instruction Review Code  1- Verbalizes Understanding      Infection Prevention: - Provides verbal and written material to individual with discussion of infection control including proper hand washing and proper equipment cleaning during exercise session.   Cardiac Rehab from 11/20/2018 in [facility] Cardiac and Pulmonary Rehab  Date  11/19/18  Educator  [facility]  Instruction Review Code  1- Verbalizes Understanding      Falls Prevention: - Provides verbal and written material to individual with discussion of falls prevention and safety.   Cardiac Rehab from 11/20/2018 in [facility] Cardiac and Pulmonary Rehab  Date  11/19/18  Educator  [facility]  Instruction Review Code  1- Verbalizes Understanding      Diabetes: - Individual verbal and written instruction to review signs/symptoms of diabetes, desired ranges of glucose level fasting, after meals and with exercise. Advice that pre and post exercise glucose checks will be done for 3 sessions at entry of program.   Chronic Lung Diseases: - Group verbal and written instruction to review updates, respiratory medications, advancements in procedures and treatments. Discuss use of supplemental oxygen including available portable oxygen systems, continuous and intermittent flow rates, concentrators, personal use and safety guidelines. Review proper use of inhaler and spacers. Provide informative websites for [place]-education.    Energy Conservation: - Provide group verbal and written instruction for methods to conserve energy, plan and organize activities. Instruct on pacing techniques, use of adaptive equipment and posture/positioning to relieve shortness of breath.   Triggers and Exacerbations: - Group verbal and written instruction to review types of environmental triggers and ways to prevent exacerbations.  Discuss weather changes, air quality and the benefits of nasal washing. Review warning signs and symptoms to help prevent infections. Discuss techniques for effective airway clearance, coughing, and vibrations.   AED/CPR: - Group verbal and written instruction with the use of models to demonstrate the basic use of the AED with the basic ABC's of resuscitation.   Anatomy and Physiology of the Lungs: - Group verbal and written instruction with the use of models to provide basic lung anatomy and physiology related to function, structure and complications of lung disease.   Anatomy & Physiology of the Heart: - Group verbal and written instruction and models provide basic cardiac anatomy and physiology, with the coronary electrical and arterial systems. Review of Valvular disease and Heart Failure   Cardiac Medications: - Group verbal and written instruction to review commonly prescribed medications for heart disease. Reviews the medication, class of the drug, and side effects.   Know Your Numbers  and Risk Factors: -Group verbal and written instruction about important numbers in your health.  Discussion of what are risk factors and how they play a role in the disease process.  Review of Cholesterol, Blood Pressure, Diabetes, and BMI and the role they play in your overall health.   Cardiac Rehab from 11/20/2018 in [facility]  Date  11/20/18  Educator  SB  Instruction Review Code  1- Verbalizes Understanding      Sleep Hygiene: -Provides group verbal and written instruction about how sleep can affect your health.  Define sleep hygiene, discuss sleep cycles and impact of sleep habits. Review good sleep hygiene tips.    Other: -Provides group and verbal instruction on various topics (see comments)    Knowledge Questionnaire Score: Knowledge Questionnaire Score - 11/19/18 1414      Knowledge Questionnaire Score   Pre Score  13/18   Reviewed with patient        Core Components/Risk Factors/Patient Goals at Admission: Personal Goals and Risk Factors at Admission - 11/19/18 1611      Core Components/Risk Factors/Patient Goals on Admission    Weight Management  Yes;Obesity    Intervention  Weight Management/Obesity: Establish reasonable short term and long term weight goals.;Weight Management: Provide education and appropriate resources to help participant work on and attain dietary goals.;Weight Management: Develop a combined nutrition and exercise program designed to reach desired caloric intake, while maintaining appropriate intake of nutrient and fiber, sodium and fats, and appropriate energy expenditure required for the weight goal.;Obesity: Provide education and appropriate resources to help participant work on and attain dietary goals.    Admit Weight  240 lb 9.6 oz (109.1 kg)    Expected Outcomes  Short Term: Continue to assess and modify interventions until short term weight is achieved;Long Term: Adherence to nutrition and physical activity/exercise program aimed toward attainment of established weight goal;Weight Loss: Understanding of general recommendations for a balanced deficit meal plan, which promotes 1-2 lb weight loss per week and includes a negative energy balance of [phone] kcal/d;Understanding recommendations for meals to include 15-35% energy as protein, 25-35% energy from fat, 35-60% energy from carbohydrates, less than 231m of dietary cholesterol, 20-35 gm of total fiber daily;Understanding of distribution of calorie intake throughout the day with the consumption of 4-5 meals/snacks    Improve shortness of breath with ADL's  Yes    Intervention  Provide education, individualized exercise plan and daily activity instruction to help decrease symptoms of SOB with activities of daily living.    Expected Outcomes  Short Term: Improve cardiorespiratory fitness to achieve a reduction of symptoms when performing ADLs;Long Term: Be able to  perform more ADLs without symptoms or delay the onset of symptoms    Intervention  Provide education on lifestyle modifcations including regular physical activity/exercise, weight management, moderate sodium restriction and increased consumption of fresh fruit, vegetables, and low fat dairy, alcohol moderation, and smoking cessation.;Monitor prescription use compliance.    Expected Outcomes  Long Term: Maintenance of blood pressure at goal levels.;Short Term: Continued assessment and intervention until BP is < 140/948mHG in hypertensive participants. < 130/8055mG in hypertensive participants with diabetes, heart failure or chronic kidney disease.    Intervention  Provide education and support for participant on nutrition & aerobic/resistive exercise along with prescribed medications to achieve LDL <29m18mDL >40mg73m Expected Outcomes  Short Term: Participant states understanding of desired cholesterol values and is compliant with medications prescribed. Participant is following  exercise prescription and nutrition guidelines.;Long Term: Cholesterol controlled with medications as prescribed, with individualized exercise RX and with personalized nutrition plan. Value goals: LDL < 43m, HDL > 40 mg.       Core Components/Risk Factors/Patient Goals Review:    Core Components/Risk Factors/Patient Goals at Discharge (Final Review):    ITP Comments: ITP Comments    Row Name 12/04/18 0703 12/11/18 1321 12/23/18 1310 12/31/18 1150 01/01/19 1044   ITP Comments  30 day review completed. Continue with ITP sent to Dr. MEmily Filbert Medical Director of Cardiac and Pulmonary Rehab for review , changes as needed and signature. Last visit 10/21  HClotealfell and hurt knee. Out since 10/21.  Called to check on pt.  She has been out since 11/20/18.  Left message.  Called to check on pt.  Out since 11/20/18. Left message.  Sent letter.  30 day review competed . ITP sent to Dr MEmily Filbertfor review, changes as needed  and ITP approval signature.      Comments: discharge ITP

## 2019-01-13 ENCOUNTER — Ambulatory Visit: Payer: Medicare Other

## 2019-01-14 ENCOUNTER — Ambulatory Visit (INDEPENDENT_AMBULATORY_CARE_PROVIDER_SITE_OTHER): Payer: Self-pay

## 2019-01-14 ENCOUNTER — Other Ambulatory Visit: Payer: Self-pay

## 2019-01-14 DIAGNOSIS — R0602 Shortness of breath: Secondary | ICD-10-CM

## 2019-01-14 MED ORDER — PERFLUTREN LIPID MICROSPHERE
1.0000 mL | INTRAVENOUS | Status: AC | PRN
  Administered 2019-01-14: 2 mL via INTRAVENOUS

## 2019-01-15 ENCOUNTER — Ambulatory Visit: Payer: Medicare Other

## 2019-01-16 ENCOUNTER — Ambulatory Visit: Payer: Medicare Other

## 2019-01-20 ENCOUNTER — Ambulatory Visit: Payer: Medicare Other

## 2019-01-22 ENCOUNTER — Ambulatory Visit: Payer: Medicare Other

## 2019-01-23 ENCOUNTER — Ambulatory Visit: Payer: Medicare Other

## 2019-01-27 ENCOUNTER — Ambulatory Visit: Payer: Medicare Other

## 2019-01-29 ENCOUNTER — Ambulatory Visit: Payer: Medicare Other

## 2019-01-30 ENCOUNTER — Ambulatory Visit: Payer: Medicare Other

## 2019-02-03 ENCOUNTER — Ambulatory Visit: Payer: Medicare Other

## 2019-02-05 ENCOUNTER — Ambulatory Visit: Payer: Medicare Other

## 2019-02-06 ENCOUNTER — Ambulatory Visit: Payer: Medicare Other

## 2019-02-10 ENCOUNTER — Ambulatory Visit: Payer: Medicare Other

## 2019-02-10 DIAGNOSIS — J449 Chronic obstructive pulmonary disease, unspecified: Secondary | ICD-10-CM | POA: Diagnosis not present

## 2019-02-12 ENCOUNTER — Ambulatory Visit: Payer: Medicare Other

## 2019-02-13 ENCOUNTER — Ambulatory Visit: Payer: Medicare Other

## 2019-02-17 ENCOUNTER — Ambulatory Visit: Payer: Medicare Other

## 2019-02-19 ENCOUNTER — Ambulatory Visit: Payer: Medicare Other

## 2019-02-20 ENCOUNTER — Ambulatory Visit: Payer: Medicare Other

## 2019-02-24 ENCOUNTER — Ambulatory Visit: Payer: Medicare Other

## 2019-02-26 ENCOUNTER — Ambulatory Visit: Payer: Medicare Other

## 2019-02-27 ENCOUNTER — Ambulatory Visit: Payer: Medicare Other

## 2019-03-03 ENCOUNTER — Ambulatory Visit: Payer: Medicare Other

## 2019-03-05 ENCOUNTER — Ambulatory Visit: Payer: Medicare Other

## 2019-03-06 ENCOUNTER — Ambulatory Visit: Payer: Medicare Other

## 2019-03-13 DIAGNOSIS — J449 Chronic obstructive pulmonary disease, unspecified: Secondary | ICD-10-CM | POA: Diagnosis not present

## 2019-03-18 ENCOUNTER — Ambulatory Visit
Admission: RE | Admit: 2019-03-18 | Discharge: 2019-03-18 | Disposition: A | Discharge status: Home / Self Care | Payer: Medicare Other | Source: Ambulatory Visit | Attending: Internal Medicine | Admitting: Internal Medicine

## 2019-03-18 ENCOUNTER — Encounter: Payer: Self-pay | Admitting: Internal Medicine

## 2019-03-18 ENCOUNTER — Other Ambulatory Visit: Payer: Self-pay

## 2019-03-18 ENCOUNTER — Ambulatory Visit (INDEPENDENT_AMBULATORY_CARE_PROVIDER_SITE_OTHER): Payer: Medicare Other | Admitting: Internal Medicine

## 2019-03-18 VITALS — BP 138/78 | HR 71 | Temp 97.1°F | Ht 64.0 in | Wt 240.0 lb

## 2019-03-18 DIAGNOSIS — Z86718 Personal history of other venous thrombosis and embolism: Secondary | ICD-10-CM | POA: Diagnosis not present

## 2019-03-18 DIAGNOSIS — M25552 Pain in left hip: Secondary | ICD-10-CM | POA: Insufficient documentation

## 2019-03-18 DIAGNOSIS — I10 Essential (primary) hypertension: Secondary | ICD-10-CM

## 2019-03-18 DIAGNOSIS — M5136 Other intervertebral disc degeneration, lumbar region: Secondary | ICD-10-CM | POA: Diagnosis not present

## 2019-03-18 DIAGNOSIS — M545 Low back pain, unspecified: Secondary | ICD-10-CM

## 2019-03-18 DIAGNOSIS — M79605 Pain in left leg: Secondary | ICD-10-CM | POA: Diagnosis not present

## 2019-03-18 DIAGNOSIS — R739 Hyperglycemia, unspecified: Secondary | ICD-10-CM

## 2019-03-18 DIAGNOSIS — M7989 Other specified soft tissue disorders: Secondary | ICD-10-CM | POA: Diagnosis not present

## 2019-03-18 DIAGNOSIS — M1612 Unilateral primary osteoarthritis, left hip: Secondary | ICD-10-CM | POA: Diagnosis not present

## 2019-03-18 DIAGNOSIS — Z9109 Other allergy status, other than to drugs and biological substances: Secondary | ICD-10-CM

## 2019-03-18 DIAGNOSIS — E7800 Pure hypercholesterolemia, unspecified: Secondary | ICD-10-CM | POA: Diagnosis not present

## 2019-03-18 DIAGNOSIS — J4520 Mild intermittent asthma, uncomplicated: Secondary | ICD-10-CM

## 2019-03-18 NOTE — Progress Notes (Deleted)
[name], female   DOB: 03-Feb-1945, 74 y.o.   MRN: [id number]   Virtual Visit via telephone Note  This visit type was conducted due to national recommendations for restrictions regarding the COVID-19 pandemic (e.g. social distancing).  This format is felt to be most appropriate for this patient at this time.  All issues noted in this document were discussed and addressed.  No physical exam was performed (except for noted visual exam findings with Video Visits).   I connected with Bonnita Nasuti Meikle by telephone and verified that I am speaking with the correct person using two identifiers. Location patient: home Location provider: work  Persons participating in the telephone visit: patient, provider  The limitations, risks, security and privacy concerns of performing an evaluation and management service by telephone and the availability of in person appointments have been discussed.  The patient expressed understanding and agreed to proceed.   Reason for visit: scheduled follow up.   HPI:    ROS: See pertinent positives and negatives per HPI.  Past Medical History:  Diagnosis Date  . Allergy   . Asthma   . Chicken pox   . History of blood clots   . Hypercholesterolemia   . Hypertension     Past Surgical History:  Procedure Laterality Date  . blood clots  2008    Family History  Problem Relation Age of Onset  . Cancer Mother        Breast  . Heart disease Mother   . Stroke Mother   . Hypertension Mother   . Breast cancer Mother        95's  . Heart disease Father   . Stroke Father   . Hypertension Father   . Diabetes Father   . Colon cancer Other        paternal cousin    SOCIAL HX: ***   Current Outpatient Medications:  .  acetaminophen (TYLENOL) 650 MG CR tablet, Take 650 mg by mouth every 8 (eight) hours as needed for pain., Disp: , Rfl:  .  albuterol (ACCUNEB) 0.63 MG/3ML nebulizer solution, Take 3 mLs (0.63 mg total) by nebulization every 6 (six)  hours as needed for wheezing., Disp: 75 mL, Rfl: 12 .  citalopram (CELEXA) 20 MG tablet, Take 1 tablet (20 mg total) by mouth daily., Disp: 90 tablet, Rfl: 1 .  Coenzyme Q10 (COQ10) 30 MG CAPS, Take by mouth., Disp: , Rfl:  .  losartan (COZAAR) 50 MG tablet, TAKE 1 TABLET BY MOUTH ONCE A DAY, Disp: 90 tablet, Rfl: 1 .  PROAIR HFA 108 (90 Base) MCG/ACT inhaler, INL 2 PFS ITL Q 6 H PRF WHZ OR SOB, Disp: , Rfl:  .  rosuvastatin (CRESTOR) 5 MG tablet, Take 1 tablet (5 mg total) by mouth daily., Disp: 90 tablet, Rfl: 1 .  warfarin (COUMADIN) 5 MG tablet, Take 5 mg on Wednesday and Sunday, 2.5 mg all other days., Disp: 30 tablet, Rfl: 1  EXAM:  VITALS per patient if applicable:  GENERAL: alert, oriented, appears well and in no acute distress  HEENT: atraumatic, conjunttiva clear, no obvious abnormalities on inspection of external nose and ears  NECK: normal movements of the head and neck  LUNGS: on inspection no signs of respiratory distress, breathing rate appears normal, no obvious gross SOB, gasping or wheezing  CV: no obvious cyanosis  MS: moves all visible extremities without noticeable abnormality  PSYCH/NEURO: pleasant and cooperative, no obvious depression or anxiety, speech and thought processing grossly  intact  ASSESSMENT AND PLAN:  Discussed the following assessment and plan:  No problem-specific Assessment & Plan notes found for this encounter.   No orders of the defined types were placed in this encounter.   No orders of the defined types were placed in this encounter.    I discussed the assessment and treatment plan with the patient. The patient was provided an opportunity to ask questions and all were answered. The patient agreed with the plan and demonstrated an understanding of the instructions.   The patient was advised to call back or seek an in-person evaluation if the symptoms worsen or if the condition fails to improve as anticipated.  I provided *** minutes  of non-face-to-face time during this encounter.   Einar Pheasant, MD

## 2019-03-19 LAB — CBC WITH DIFFERENTIAL/PLATELET
Basophils Absolute: 0.1 10*3/uL (ref 0.0–0.1)
Basophils Relative: 0.9 % (ref 0.0–3.0)
Eosinophils Absolute: 0.3 10*3/uL (ref 0.0–0.7)
Eosinophils Relative: 4.7 % (ref 0.0–5.0)
HCT: 41.5 % (ref 36.0–46.0)
Hemoglobin: 13.7 g/dL (ref 12.0–15.0)
Lymphocytes Relative: 31.5 % (ref 12.0–46.0)
Lymphs Abs: 2.3 10*3/uL (ref 0.7–4.0)
MCHC: 33 g/dL (ref 30.0–36.0)
MCV: 87.8 fl (ref 78.0–100.0)
Monocytes Absolute: 0.4 10*3/uL (ref 0.1–1.0)
Monocytes Relative: 5.8 % (ref 3.0–12.0)
Neutro Abs: 4.1 10*3/uL (ref 1.4–7.7)
Neutrophils Relative %: 57.1 % (ref 43.0–77.0)
Platelets: 276 10*3/uL (ref 150.0–400.0)
RBC: 4.72 Mil/uL (ref 3.87–5.11)
RDW: 14.1 % (ref 11.5–15.5)
WBC: 7.2 10*3/uL (ref 4.0–10.5)

## 2019-03-19 LAB — HEPATIC FUNCTION PANEL
ALT: 15 U/L (ref 0–35)
AST: 24 U/L (ref 0–37)
Albumin: 4.1 g/dL (ref 3.5–5.2)
Alkaline Phosphatase: 58 U/L (ref 39–117)
Bilirubin, Direct: 0.2 mg/dL (ref 0.0–0.3)
Total Bilirubin: 0.9 mg/dL (ref 0.2–1.2)
Total Protein: 6.5 g/dL (ref 6.0–8.3)

## 2019-03-19 LAB — BASIC METABOLIC PANEL
BUN: 13 mg/dL (ref 6–23)
CO2: 28 mEq/L (ref 19–32)
Calcium: 8.8 mg/dL (ref 8.4–10.5)
Chloride: 103 mEq/L (ref 96–112)
Creatinine, Ser: 0.99 mg/dL (ref 0.40–1.20)
GFR: 54.62 mL/min — ABNORMAL LOW (ref >60.00)
Glucose, Bld: 85 mg/dL (ref 70–99)
Potassium: 3.6 mEq/L (ref 3.5–5.1)
Sodium: 140 mEq/L (ref 135–145)

## 2019-03-19 LAB — TSH: TSH: 3.78 u[IU]/mL (ref 0.35–4.50)

## 2019-03-19 LAB — PROTIME-INR
INR: 2.8 — ABNORMAL HIGH
Prothrombin Time: 27.3 s — ABNORMAL HIGH (ref 9.0–11.5)

## 2019-03-19 LAB — HEMOGLOBIN A1C: Hgb A1c MFr Bld: 6.3 % (ref 4.6–6.5)

## 2019-03-20 ENCOUNTER — Other Ambulatory Visit: Payer: Self-pay | Admitting: Internal Medicine

## 2019-03-20 DIAGNOSIS — M7989 Other specified soft tissue disorders: Secondary | ICD-10-CM

## 2019-03-20 NOTE — Progress Notes (Signed)
Order placed for vascular surgery.

## 2019-03-24 ENCOUNTER — Other Ambulatory Visit: Payer: Self-pay

## 2019-03-24 ENCOUNTER — Encounter: Payer: Self-pay | Admitting: Internal Medicine

## 2019-03-24 NOTE — Assessment & Plan Note (Signed)
Blood pressure as outlined.  Continue current medication regimen.  Follow pressures.  Follow metabolic panel.  

## 2019-03-24 NOTE — Assessment & Plan Note (Signed)
Breathing stable.  Continue current inhaler regimen.  No acute symptoms now.

## 2019-03-24 NOTE — Assessment & Plan Note (Signed)
Low carb diet and exercise.  Follow met b and a1c.  

## 2019-03-24 NOTE — Assessment & Plan Note (Signed)
Low cholesterol diet and exercise.  Follow lipid panel.   

## 2019-03-24 NOTE — Assessment & Plan Note (Signed)
Low back pain and left hip pain as outlined.  S/p fall.  Check xray.  Tylenol for pain.  On coumadin.  Avoid antiinflammatories.  Discussed referral back to ortho.

## 2019-03-24 NOTE — Progress Notes (Signed)
Subjective:    [name], female    DOB: 19-Feb-1945, 74 y.o.   MRN: [id number]  HPI This visit occurred during the SARS-CoV-2 public health emergency.  Safety protocols were in place, including screening questions prior to the visit, additional usage of staff PPE, and extensive cleaning of exam room while observing appropriate contact time as indicated for disinfecting solutions.  Patient here for a scheduled follow up.  She was originally scheduled for a telephone visit.  This was converted to an in office visit, after discussion on the phone - realized that I needed to see her in the office.  She presents in office reporting persistent pain after fall 11/22/18.  When fell, landed on her left leg/knee.  Was evaluated by ortho.  Has still had persistent pain.  Left leg more swollen than right.  Also reports increased pain in her back and knee/leg with going from sitting to standing.  Has noticed some itching - lower legs.  Swelling does not appear to improve with elevation.  Limiting her activity.  She is still able to get around her house.  No chest pain.  Breathing stable.  Reports no increased cough or congestion.  Three weeks ago, had sinus congestion.  Treated with saline nasal spray and used nebs x 2 days.  Feels back to baseline now.  Has cut out sweets.  Eating.  No nausea or vomiting.  Bowels moving.    Past Medical History:  Diagnosis Date  . Allergy   . Asthma   . Chicken pox   . History of blood clots   . Hypercholesterolemia   . Hypertension    Past Surgical History:  Procedure Laterality Date  . blood clots  2008   Family History  Problem Relation Age of Onset  . Cancer Mother        Breast  . Heart disease Mother   . Stroke Mother   . Hypertension Mother   . Breast cancer Mother        32's  . Heart disease Father   . Stroke Father   . Hypertension Father   . Diabetes Father   . Colon cancer Other        paternal cousin   Social History    Socioeconomic History  . Marital status: Widowed    Spouse name: Not on file  . Number of children: Not on file  . Years of education: Not on file  . Highest education level: Not on file  Occupational History  . Not on file  Tobacco Use  . Smoking status: Never Smoker  . Smokeless tobacco: Never Used  Substance and Sexual Activity  . Alcohol use: No    Alcohol/week: 0.0 standard drinks  . Drug use: No  . Sexual activity: Not on file  Other Topics Concern  . Not on file  Social History Narrative  . Not on file   Social Determinants of Health   Financial Resource Strain: Low Risk   . Difficulty of Paying Living Expenses: Not hard at all  Food Insecurity: No Food Insecurity  . Worried About Charity fundraiser in the Last Year: Never true  . Ran Out of Food in the Last Year: Never true  Transportation Needs: No Transportation Needs  . Lack of Transportation (Medical): No  . Lack of Transportation (Non-Medical): No  Physical Activity: Unknown  . Days of Exercise per Week: 0 days  . Minutes of Exercise per Session: Not on  file  Stress: No Stress Concern Present  . Feeling of Stress : Not at all  Social Connections:   . Frequency of Communication with Friends and Family: Not on file  . Frequency of Social Gatherings with Friends and Family: Not on file  . Attends Religious Services: Not on file  . Active Member of Clubs or Organizations: Not on file  . Attends Archivist Meetings: Not on file  . Marital Status: Not on file    Outpatient Encounter Medications as of 03/18/2019  Medication Sig  . acetaminophen (TYLENOL) 650 MG CR tablet Take 650 mg by mouth every 8 (eight) hours as needed for pain.  Marland Kitchen albuterol (ACCUNEB) 0.63 MG/3ML nebulizer solution Take 3 mLs (0.63 mg total) by nebulization every 6 (six) hours as needed for wheezing.  . citalopram (CELEXA) 20 MG tablet Take 1 tablet (20 mg total) by mouth daily.  . Coenzyme Q10 (COQ10) 30 MG CAPS Take by  mouth.  . losartan (COZAAR) 50 MG tablet TAKE 1 TABLET BY MOUTH ONCE A DAY  . PROAIR HFA 108 (90 Base) MCG/ACT inhaler INL 2 PFS ITL Q 6 H PRF WHZ OR SOB  . rosuvastatin (CRESTOR) 5 MG tablet Take 1 tablet (5 mg total) by mouth daily.  Marland Kitchen warfarin (COUMADIN) 5 MG tablet Take 5 mg on Wednesday and Sunday, 2.5 mg all other days.   No facility-administered encounter medications on file as of 03/18/2019.   Review of Systems  Constitutional: Negative for appetite change, fever and unexpected weight change.  HENT: Negative for congestion and sinus pressure.   Respiratory: Negative for cough and chest tightness.        Breathing stable.   Cardiovascular: Positive for leg swelling. Negative for chest pain and palpitations.  Gastrointestinal: Negative for abdominal pain, diarrhea, nausea and vomiting.  Genitourinary: Negative for difficulty urinating and dysuria.  Musculoskeletal: Positive for back pain.       Hip pain.  Leg stiffness.    Skin: Negative for color change and rash.  Neurological: Negative for dizziness, light-headedness and headaches.  Psychiatric/Behavioral: Negative for agitation and dysphoric mood.       Objective:    Physical Exam Constitutional:      General: She is not in acute distress.    Appearance: Normal appearance.  HENT:     Head: Normocephalic and atraumatic.     Right Ear: External ear normal.     Left Ear: External ear normal.  Eyes:     General: No scleral icterus.       Right eye: No discharge.        Left eye: No discharge.     Conjunctiva/sclera: Conjunctivae normal.  Neck:     Thyroid: No thyromegaly.  Cardiovascular:     Rate and Rhythm: Normal rate and regular rhythm.  Pulmonary:     Effort: No respiratory distress.     Breath sounds: Normal breath sounds. No wheezing.  Abdominal:     General: Bowel sounds are normal.     Palpations: Abdomen is soft.     Tenderness: There is no abdominal tenderness.  Musculoskeletal:        General: No  swelling.     Cervical back: Neck supple. No tenderness.     Comments: Some stasis changes.    Lymphadenopathy:     Cervical: No cervical adenopathy.  Skin:    Coloration: Skin is not jaundiced.     Comments: Stasis changes - lower extremities.   Neurological:  Mental Status: She is alert.  Psychiatric:        Mood and Affect: Mood normal.        Behavior: Behavior normal.     BP 138/78   Pulse 71   Temp (!) 97.1 F (36.2 C)   Ht 5\' 4"  (1.626 m)   Wt 240 lb (108.9 kg)   LMP 04/29/1997   SpO2 97%   BMI 41.20 kg/m  Wt Readings from Last 3 Encounters:  03/18/19 240 lb (108.9 kg)  12/11/18 240 lb 3.2 oz (109 kg)  11/22/18 237 lb 6.4 oz (107.7 kg)     Lab Results  Component Value Date   WBC 7.2 03/18/2019   HGB 13.7 03/18/2019   HCT 41.5 03/18/2019   PLT 276.0 03/18/2019   GLUCOSE 85 03/18/2019   CHOL 162 11/19/2018   TRIG 144.0 11/19/2018   HDL 56.10 11/19/2018   LDLDIRECT 174.0 11/13/2017   LDLCALC 77 11/19/2018   ALT 15 03/18/2019   AST 24 03/18/2019   NA 140 03/18/2019   K 3.6 03/18/2019   CL 103 03/18/2019   CREATININE 0.99 03/18/2019   BUN 13 03/18/2019   CO2 28 03/18/2019   TSH 3.78 03/18/2019   INR 2.8 (H) 03/18/2019   HGBA1C 6.3 03/18/2019    DG Lumbar Spine 2-3 Views  Result Date: 03/19/2019 CLINICAL DATA:  Back pain and left hip pain. Remote injury. EXAM: LUMBAR SPINE - 2-3 VIEW COMPARISON:  Lumbar radiographs 02/04/2013 FINDINGS: Stable retrolisthesis of L1 on L2 and L2 on L3. Vertebral body heights are preserved. Diffuse disc space narrowing and endplate spurring, most prominent at L2-L3. Degenerative disc disease at L2-L3 has progressed from prior. Facet hypertrophy at L3-L4, L4-L5, and L5-S1. Degenerative change of the sacroiliac joints are congruent. No evidence of focal bone lesion. Aortic atherosclerosis. IMPRESSION: 1. 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 2. Degenerative disc disease at L2-L3 has  progressed from 2015. 3. Stable retrolisthesis of L1 on L2 and L2 on L3. Electronically Signed   By: Keith Rake M.D.   On: 03/19/2019 03:35   US Venous Img Lower Unilateral Left  Result Date: 03/18/2019 CLINICAL DATA:  Left leg pain and swelling. EXAM: Left LOWER EXTREMITY VENOUS DOPPLER ULTRASOUND TECHNIQUE: Gray-scale sonography with compression, as well as color and duplex ultrasound, were performed to evaluate the deep venous system(s) from the level of the common femoral vein through the popliteal and proximal calf veins. COMPARISON:  04/11/2010 FINDINGS: VENOUS Normal compressibility of the common femoral, superficial femoral, and popliteal veins, as well as the visualized calf veins. Visualized portions of profunda femoral vein and great saphenous vein unremarkable. No filling defects to suggest DVT on grayscale or color Doppler imaging. Doppler waveforms show normal direction of venous flow, normal respiratory phasicity and response to augmentation. Limited views of the contralateral common femoral vein are unremarkable. OTHER None. Limitations: none IMPRESSION: No femoropopliteal DVT nor evidence of DVT within the visualized calf veins. If clinical symptoms are inconsistent or if there are persistent or worsening symptoms, further imaging (possibly involving the iliac veins) may be warranted. Electronically Signed   By: Misty Stanley M.D.   On: 03/18/2019 17:44   DG Hip Unilat W OR W/O Pelvis 2-3 Views Left  Result Date: 03/19/2019 CLINICAL DATA:  Low back and left hip pain. Remote injury. EXAM: DG HIP (WITH OR WITHOUT PELVIS) 2-3V LEFT COMPARISON:  Left hip radiograph 03/07/2016. MRI 04/25/2016 FINDINGS: Bilateral hip osteoarthritis with joint space narrowing  and acetabular spurring. Femoral heads are seated in the acetabulum. Pubic rami are intact. Pubic symphysis and sacroiliac joints are congruent. No evidence of focal bone lesion or bony destruction. No radiographic evidence of avascular  necrosis. No suspicious soft tissue abnormalities. IMPRESSION: Bilateral hip osteoarthritis. Electronically Signed   By: Keith Rake M.D.   On: 03/19/2019 03:37       Assessment & Plan:   Problem List Items Addressed This Visit    Essential hypertension, benign - Primary   Relevant Orders   CBC with Differential/Platelet (Completed)   TSH (Completed)   Basic metabolic panel (Completed)   History of blood clots   Relevant Orders   Protime-INR (Completed)   Hypercholesterolemia   Relevant Orders   Hepatic function panel (Completed)   Hyperglycemia   Relevant Orders   Hemoglobin A1c (Completed)   Left hip pain   Relevant Orders   DG Hip Unilat W OR W/O Pelvis 2-3 Views Left (Completed)   Leg pain   Relevant Orders   US Venous Img Lower Unilateral Left (Completed)   DG Lumbar Spine 2-3 Views (Completed)   Low back pain   Relevant Orders   DG Lumbar Spine 2-3 Views (Completed)       Einar Pheasant, MD

## 2019-03-24 NOTE — Assessment & Plan Note (Signed)
Leg pain, swelling.  Given history of DVT - check ultrasound - lower extremity.  On coumadin.  Check pt/inr today.  Overdue.

## 2019-03-24 NOTE — Assessment & Plan Note (Signed)
On coumadin.  Overdue pt/inr.  Check today.

## 2019-03-24 NOTE — Assessment & Plan Note (Signed)
Persistent pain s/p previous injury.  Xray l-s spine and hip.  Previously saw ortho.  Discussed f/u.

## 2019-03-24 NOTE — Assessment & Plan Note (Signed)
Stable

## 2019-03-26 ENCOUNTER — Encounter: Payer: Self-pay | Admitting: Internal Medicine

## 2019-03-26 ENCOUNTER — Other Ambulatory Visit: Payer: Self-pay | Admitting: Internal Medicine

## 2019-03-26 ENCOUNTER — Other Ambulatory Visit: Payer: Self-pay

## 2019-03-26 DIAGNOSIS — M5442 Lumbago with sciatica, left side: Secondary | ICD-10-CM

## 2019-03-26 DIAGNOSIS — M25552 Pain in left hip: Secondary | ICD-10-CM

## 2019-03-26 MED ORDER — CITALOPRAM HYDROBROMIDE 20 MG PO TABS: 20.0000 mg | ORAL_TABLET | Freq: Every day | ORAL | 1 refills | Status: DC

## 2019-03-26 MED ORDER — LOSARTAN POTASSIUM 50 MG PO TABS: ORAL_TABLET | ORAL | 1 refills | Status: DC

## 2019-03-26 MED ORDER — ROSUVASTATIN CALCIUM 5 MG PO TABS: 5.0000 mg | ORAL_TABLET | Freq: Every day | ORAL | 1 refills | Status: DC

## 2019-03-26 MED ORDER — WARFARIN SODIUM 5 MG PO TABS: ORAL_TABLET | ORAL | 1 refills | Status: DC

## 2019-03-26 NOTE — Progress Notes (Signed)
Order placed for ortho referral.   

## 2019-03-27 DIAGNOSIS — M48062 Spinal stenosis, lumbar region with neurogenic claudication: Secondary | ICD-10-CM | POA: Diagnosis not present

## 2019-03-27 DIAGNOSIS — M169 Osteoarthritis of hip, unspecified: Secondary | ICD-10-CM | POA: Diagnosis not present

## 2019-03-31 DIAGNOSIS — K661 Hemoperitoneum: Secondary | ICD-10-CM

## 2019-03-31 DIAGNOSIS — K683 Retroperitoneal hematoma: Secondary | ICD-10-CM

## 2019-03-31 HISTORY — DX: Hemoperitoneum: K66.1

## 2019-03-31 HISTORY — DX: Retroperitoneal hematoma: K68.3

## 2019-04-08 ENCOUNTER — Encounter (INDEPENDENT_AMBULATORY_CARE_PROVIDER_SITE_OTHER): Payer: Medicare Other | Admitting: Vascular Surgery

## 2019-04-10 DIAGNOSIS — J449 Chronic obstructive pulmonary disease, unspecified: Secondary | ICD-10-CM | POA: Diagnosis not present

## 2019-04-19 ENCOUNTER — Encounter: Payer: Self-pay | Admitting: Emergency Medicine

## 2019-04-19 ENCOUNTER — Inpatient Hospital Stay
Admission: EM | Admit: 2019-04-19 | Discharge: 2019-04-19 | DRG: 394 | Disposition: A | Discharge status: Other Acute Inpatient Hospital | Payer: Medicare Other | Attending: Internal Medicine | Admitting: Internal Medicine

## 2019-04-19 ENCOUNTER — Other Ambulatory Visit: Payer: Self-pay

## 2019-04-19 ENCOUNTER — Emergency Department: Payer: Medicare Other

## 2019-04-19 ENCOUNTER — Inpatient Hospital Stay (HOSPITAL_COMMUNITY)
Admission: AD | Admit: 2019-04-19 | Discharge: 2019-04-27 | DRG: 393 | Disposition: A | Discharge status: Home Health | Payer: Medicare Other | Source: Other Acute Inpatient Hospital | Attending: Internal Medicine | Admitting: Internal Medicine

## 2019-04-19 DIAGNOSIS — Z7901 Long term (current) use of anticoagulants: Secondary | ICD-10-CM | POA: Diagnosis not present

## 2019-04-19 DIAGNOSIS — E785 Hyperlipidemia, unspecified: Secondary | ICD-10-CM | POA: Diagnosis present

## 2019-04-19 DIAGNOSIS — R0602 Shortness of breath: Secondary | ICD-10-CM | POA: Diagnosis not present

## 2019-04-19 DIAGNOSIS — Z803 Family history of malignant neoplasm of breast: Secondary | ICD-10-CM | POA: Diagnosis not present

## 2019-04-19 DIAGNOSIS — I10 Essential (primary) hypertension: Secondary | ICD-10-CM | POA: Diagnosis present

## 2019-04-19 DIAGNOSIS — E7800 Pure hypercholesterolemia, unspecified: Secondary | ICD-10-CM | POA: Diagnosis present

## 2019-04-19 DIAGNOSIS — Z86718 Personal history of other venous thrombosis and embolism: Secondary | ICD-10-CM

## 2019-04-19 DIAGNOSIS — M6281 Muscle weakness (generalized): Secondary | ICD-10-CM | POA: Diagnosis not present

## 2019-04-19 DIAGNOSIS — R112 Nausea with vomiting, unspecified: Secondary | ICD-10-CM | POA: Diagnosis present

## 2019-04-19 DIAGNOSIS — K683 Retroperitoneal hematoma: Secondary | ICD-10-CM

## 2019-04-19 DIAGNOSIS — J9601 Acute respiratory failure with hypoxia: Secondary | ICD-10-CM | POA: Diagnosis not present

## 2019-04-19 DIAGNOSIS — Z20822 Contact with and (suspected) exposure to covid-19: Secondary | ICD-10-CM | POA: Diagnosis present

## 2019-04-19 DIAGNOSIS — J90 Pleural effusion, not elsewhere classified: Secondary | ICD-10-CM | POA: Diagnosis not present

## 2019-04-19 DIAGNOSIS — Z79899 Other long term (current) drug therapy: Secondary | ICD-10-CM | POA: Diagnosis not present

## 2019-04-19 DIAGNOSIS — K661 Hemoperitoneum: Secondary | ICD-10-CM | POA: Diagnosis not present

## 2019-04-19 DIAGNOSIS — R109 Unspecified abdominal pain: Secondary | ICD-10-CM | POA: Diagnosis not present

## 2019-04-19 DIAGNOSIS — Z6839 Body mass index (BMI) 39.0-39.9, adult: Secondary | ICD-10-CM

## 2019-04-19 DIAGNOSIS — M25551 Pain in right hip: Secondary | ICD-10-CM | POA: Diagnosis not present

## 2019-04-19 DIAGNOSIS — K625 Hemorrhage of anus and rectum: Secondary | ICD-10-CM | POA: Diagnosis not present

## 2019-04-19 DIAGNOSIS — Z8249 Family history of ischemic heart disease and other diseases of the circulatory system: Secondary | ICD-10-CM

## 2019-04-19 DIAGNOSIS — R1011 Right upper quadrant pain: Secondary | ICD-10-CM | POA: Diagnosis not present

## 2019-04-19 DIAGNOSIS — R1010 Upper abdominal pain, unspecified: Secondary | ICD-10-CM | POA: Diagnosis not present

## 2019-04-19 DIAGNOSIS — Z823 Family history of stroke: Secondary | ICD-10-CM | POA: Diagnosis not present

## 2019-04-19 DIAGNOSIS — R1084 Generalized abdominal pain: Secondary | ICD-10-CM

## 2019-04-19 DIAGNOSIS — R17 Unspecified jaundice: Secondary | ICD-10-CM | POA: Diagnosis not present

## 2019-04-19 DIAGNOSIS — Z9981 Dependence on supplemental oxygen: Secondary | ICD-10-CM

## 2019-04-19 DIAGNOSIS — R58 Hemorrhage, not elsewhere classified: Secondary | ICD-10-CM

## 2019-04-19 DIAGNOSIS — J441 Chronic obstructive pulmonary disease with (acute) exacerbation: Secondary | ICD-10-CM

## 2019-04-19 DIAGNOSIS — E876 Hypokalemia: Secondary | ICD-10-CM

## 2019-04-19 DIAGNOSIS — M810 Age-related osteoporosis without current pathological fracture: Secondary | ICD-10-CM | POA: Diagnosis not present

## 2019-04-19 DIAGNOSIS — Z7951 Long term (current) use of inhaled steroids: Secondary | ICD-10-CM

## 2019-04-19 DIAGNOSIS — J9621 Acute and chronic respiratory failure with hypoxia: Secondary | ICD-10-CM | POA: Diagnosis not present

## 2019-04-19 DIAGNOSIS — D6851 Activated protein C resistance: Secondary | ICD-10-CM | POA: Diagnosis present

## 2019-04-19 DIAGNOSIS — Z833 Family history of diabetes mellitus: Secondary | ICD-10-CM | POA: Diagnosis not present

## 2019-04-19 DIAGNOSIS — R52 Pain, unspecified: Secondary | ICD-10-CM

## 2019-04-19 DIAGNOSIS — E669 Obesity, unspecified: Secondary | ICD-10-CM | POA: Diagnosis present

## 2019-04-19 DIAGNOSIS — R0902 Hypoxemia: Secondary | ICD-10-CM

## 2019-04-19 DIAGNOSIS — R791 Abnormal coagulation profile: Secondary | ICD-10-CM

## 2019-04-19 LAB — HEMOGLOBIN AND HEMATOCRIT, BLOOD
HCT: 35.6 % — ABNORMAL LOW (ref 36.0–46.0)
Hemoglobin: 11.5 g/dL — ABNORMAL LOW (ref 12.0–15.0)

## 2019-04-19 LAB — CBC
HCT: 42.7 % (ref 36.0–46.0)
HCT: 41.5 % (ref 36.0–46.0)
HCT: 35.9 % — ABNORMAL LOW (ref 36.0–46.0)
Hemoglobin: 13.7 g/dL (ref 12.0–15.0)
Hemoglobin: 13.3 g/dL (ref 12.0–15.0)
Hemoglobin: 12.1 g/dL (ref 12.0–15.0)
MCH: 28.2 pg (ref 26.0–34.0)
MCH: 28.5 pg (ref 26.0–34.0)
MCH: 29 pg (ref 26.0–34.0)
MCHC: 32.1 g/dL (ref 30.0–36.0)
MCHC: 32 g/dL (ref 30.0–36.0)
MCHC: 33.7 g/dL (ref 30.0–36.0)
MCV: 87.9 fL (ref 80.0–100.0)
MCV: 89.1 fL (ref 80.0–100.0)
MCV: 86.1 fL (ref 80.0–100.0)
Platelets: 285 10*3/uL (ref 150–400)
Platelets: 270 10*3/uL (ref 150–400)
Platelets: 305 10*3/uL (ref 150–400)
RBC: 4.86 MIL/uL (ref 3.87–5.11)
RBC: 4.66 MIL/uL (ref 3.87–5.11)
RBC: 4.17 MIL/uL (ref 3.87–5.11)
RDW: 12.9 % (ref 11.5–15.5)
RDW: 13.1 % (ref 11.5–15.5)
RDW: 13 % (ref 11.5–15.5)
WBC: 8.7 10*3/uL (ref 4.0–10.5)
WBC: 12.6 10*3/uL — ABNORMAL HIGH (ref 4.0–10.5)
WBC: 12.3 10*3/uL — ABNORMAL HIGH (ref 4.0–10.5)
nRBC: 0 % (ref 0.0–0.2)
nRBC: 0 % (ref 0.0–0.2)
nRBC: 0 % (ref 0.0–0.2)

## 2019-04-19 LAB — PROTIME-INR
INR: 4.8 (ref 0.8–1.2)
INR: 1.2 (ref 0.8–1.2)
Prothrombin Time: 44.7 seconds — ABNORMAL HIGH (ref 11.4–15.2)
Prothrombin Time: 15.3 s — ABNORMAL HIGH (ref 11.4–15.2)

## 2019-04-19 LAB — COMPREHENSIVE METABOLIC PANEL
ALT: 11 U/L (ref 0–44)
AST: 19 U/L (ref 15–41)
Albumin: 4.2 g/dL (ref 3.5–5.0)
Alkaline Phosphatase: 69 U/L (ref 38–126)
Anion gap: 12 (ref 5–15)
BUN: 7 mg/dL — ABNORMAL LOW (ref 8–23)
CO2: 26 mmol/L (ref 22–32)
Calcium: 8.8 mg/dL — ABNORMAL LOW (ref 8.9–10.3)
Chloride: 101 mmol/L (ref 98–111)
Creatinine, Ser: 0.88 mg/dL (ref 0.44–1.00)
GFR calc Af Amer: >60 mL/min (ref >60)
GFR calc non Af Amer: >60 mL/min (ref >60)
Glucose, Bld: 137 mg/dL — ABNORMAL HIGH (ref 70–99)
Potassium: 3.2 mmol/L — ABNORMAL LOW (ref 3.5–5.1)
Sodium: 139 mmol/L (ref 135–145)
Total Bilirubin: 1.4 mg/dL — ABNORMAL HIGH (ref 0.3–1.2)
Total Protein: 7.5 g/dL (ref 6.5–8.1)

## 2019-04-19 LAB — URINALYSIS, COMPLETE (UACMP) WITH MICROSCOPIC
Bacteria, UA: NONE SEEN
Bilirubin Urine: NEGATIVE
Glucose, UA: NEGATIVE mg/dL
Ketones, ur: 5 mg/dL — AB
Nitrite: NEGATIVE
Protein, ur: NEGATIVE mg/dL
Specific Gravity, Urine: 1.011 (ref 1.005–1.030)
pH: 7 (ref 5.0–8.0)

## 2019-04-19 LAB — MAGNESIUM: Magnesium: 1.4 mg/dL — ABNORMAL LOW (ref 1.7–2.4)

## 2019-04-19 LAB — COMPREHENSIVE METABOLIC PANEL WITH GFR
ALT: 18 U/L (ref 0–44)
AST: 29 U/L (ref 15–41)
Albumin: 4 g/dL (ref 3.5–5.0)
Alkaline Phosphatase: 63 U/L (ref 38–126)
Anion gap: 16 — ABNORMAL HIGH (ref 5–15)
BUN: 7 mg/dL — ABNORMAL LOW (ref 8–23)
CO2: 27 mmol/L (ref 22–32)
Calcium: 8.8 mg/dL — ABNORMAL LOW (ref 8.9–10.3)
Chloride: 93 mmol/L — ABNORMAL LOW (ref 98–111)
Creatinine, Ser: 0.95 mg/dL (ref 0.44–1.00)
GFR calc Af Amer: >60 mL/min
GFR calc non Af Amer: 59 mL/min — ABNORMAL LOW
Glucose, Bld: 150 mg/dL — ABNORMAL HIGH (ref 70–99)
Potassium: 3.7 mmol/L (ref 3.5–5.1)
Sodium: 136 mmol/L (ref 135–145)
Total Bilirubin: 2.8 mg/dL — ABNORMAL HIGH (ref 0.3–1.2)
Total Protein: 7.1 g/dL (ref 6.5–8.1)

## 2019-04-19 LAB — TROPONIN I (HIGH SENSITIVITY)
Troponin I (High Sensitivity): 23 ng/L — ABNORMAL HIGH (ref <18)
Troponin I (High Sensitivity): 23 ng/L — ABNORMAL HIGH (ref <18)

## 2019-04-19 LAB — RESPIRATORY PANEL BY RT PCR (FLU A&B, COVID)
Influenza A by PCR: NEGATIVE
Influenza B by PCR: NEGATIVE
SARS Coronavirus 2 by RT PCR: NEGATIVE

## 2019-04-19 LAB — ABO/RH: ABO/RH(D): A POS

## 2019-04-19 LAB — TYPE AND SCREEN
ABO/RH(D): A POS
ABO/RH(D): A POS
Antibody Screen: NEGATIVE
Antibody Screen: NEGATIVE

## 2019-04-19 LAB — LIPASE, BLOOD: Lipase: 23 U/L (ref 11–51)

## 2019-04-19 MED ORDER — BUDESONIDE 0.5 MG/2ML IN SUSP
2.0000 mL | Freq: Two times a day (BID) | RESPIRATORY_TRACT | Status: DC
  Administered 2019-04-20: 0.5 mg via RESPIRATORY_TRACT
  Filled 2019-04-19: qty 2

## 2019-04-19 MED ORDER — ALBUTEROL SULFATE (2.5 MG/3ML) 0.083% IN NEBU: 2.5000 mg | INHALATION_SOLUTION | Freq: Four times a day (QID) | RESPIRATORY_TRACT | Status: DC | PRN

## 2019-04-19 MED ORDER — ACETAMINOPHEN 650 MG RE SUPP: 650.0000 mg | Freq: Four times a day (QID) | RECTAL | Status: DC | PRN

## 2019-04-19 MED ORDER — ONDANSETRON HCL 4 MG PO TABS: 4.0000 mg | ORAL_TABLET | Freq: Four times a day (QID) | ORAL | Status: DC | PRN

## 2019-04-19 MED ORDER — SODIUM CHLORIDE 0.9% FLUSH
3.0000 mL | Freq: Two times a day (BID) | INTRAVENOUS | Status: DC
  Administered 2019-04-19: 3 mL via INTRAVENOUS

## 2019-04-19 MED ORDER — SODIUM CHLORIDE 0.9% FLUSH
3.0000 mL | Freq: Two times a day (BID) | INTRAVENOUS | Status: DC
  Administered 2019-04-20 – 2019-04-27 (×12): 3 mL via INTRAVENOUS

## 2019-04-19 MED ORDER — CITALOPRAM HYDROBROMIDE 20 MG PO TABS: 20.0000 mg | ORAL_TABLET | Freq: Every day | ORAL | Status: DC

## 2019-04-19 MED ORDER — ROSUVASTATIN CALCIUM 5 MG PO TABS
5.0000 mg | ORAL_TABLET | Freq: Every day | ORAL | Status: DC
  Filled 2019-04-19: qty 1

## 2019-04-19 MED ORDER — SODIUM CHLORIDE 0.9 % IV SOLN: INTRAVENOUS | Status: DC

## 2019-04-19 MED ORDER — VITAMIN K1 10 MG/ML IJ SOLN
10.0000 mg | Freq: Once | INTRAVENOUS | Status: AC
  Administered 2019-04-19: 10 mg via INTRAVENOUS
  Filled 2019-04-19: qty 1

## 2019-04-19 MED ORDER — IOHEXOL 9 MG/ML PO SOLN
500.0000 mL | Freq: Once | ORAL | Status: DC | PRN
  Administered 2019-04-19: 500 mL via ORAL

## 2019-04-19 MED ORDER — BUDESONIDE 0.5 MG/2ML IN SUSP
2.0000 mL | Freq: Two times a day (BID) | RESPIRATORY_TRACT | Status: DC
  Filled 2019-04-19 (×2): qty 2

## 2019-04-19 MED ORDER — ONDANSETRON HCL 4 MG/2ML IJ SOLN
INTRAMUSCULAR | Status: AC
  Administered 2019-04-19: 4 mg
  Filled 2019-04-19: qty 2

## 2019-04-19 MED ORDER — POTASSIUM CHLORIDE CRYS ER 20 MEQ PO TBCR: 40.0000 meq | EXTENDED_RELEASE_TABLET | Freq: Once | ORAL | Status: DC

## 2019-04-19 MED ORDER — ALBUTEROL SULFATE HFA 108 (90 BASE) MCG/ACT IN AERS: 2.0000 | INHALATION_SPRAY | Freq: Four times a day (QID) | RESPIRATORY_TRACT | Status: DC | PRN

## 2019-04-19 MED ORDER — ACETAMINOPHEN 325 MG PO TABS: 650.0000 mg | ORAL_TABLET | Freq: Four times a day (QID) | ORAL | Status: DC | PRN

## 2019-04-19 MED ORDER — FENTANYL CITRATE (PF) 100 MCG/2ML IJ SOLN
50.0000 ug | Freq: Once | INTRAMUSCULAR | Status: AC
  Administered 2019-04-19: 50 ug via INTRAVENOUS
  Filled 2019-04-19: qty 2

## 2019-04-19 MED ORDER — LORAZEPAM 2 MG/ML IJ SOLN
1.0000 mg | Freq: Once | INTRAMUSCULAR | Status: AC
  Administered 2019-04-19: 1 mg via INTRAVENOUS
  Filled 2019-04-19: qty 1

## 2019-04-19 MED ORDER — ACETAMINOPHEN ER 650 MG PO TBCR: 650.0000 mg | EXTENDED_RELEASE_TABLET | Freq: Three times a day (TID) | ORAL | Status: DC | PRN

## 2019-04-19 MED ORDER — ONDANSETRON HCL 4 MG/2ML IJ SOLN
4.0000 mg | Freq: Four times a day (QID) | INTRAMUSCULAR | Status: DC | PRN
  Administered 2019-04-19 – 2019-04-21 (×7): 4 mg via INTRAVENOUS
  Filled 2019-04-19 (×7): qty 2

## 2019-04-19 MED ORDER — LOSARTAN POTASSIUM 50 MG PO TABS: 50.0000 mg | ORAL_TABLET | Freq: Every day | ORAL | Status: DC

## 2019-04-19 MED ORDER — SODIUM CHLORIDE 0.9 % IV SOLN: INTRAVENOUS | Status: AC

## 2019-04-19 MED ORDER — ONDANSETRON HCL 4 MG/2ML IJ SOLN: 4.0000 mg | Freq: Four times a day (QID) | INTRAMUSCULAR | Status: DC | PRN

## 2019-04-19 MED ORDER — SODIUM CHLORIDE 0.9 % IV SOLN
10.0000 mL/h | Freq: Once | INTRAVENOUS | Status: AC
  Administered 2019-04-19: 10 mL/h via INTRAVENOUS

## 2019-04-19 MED ORDER — MORPHINE SULFATE (PF) 4 MG/ML IV SOLN
4.0000 mg | Freq: Once | INTRAVENOUS | Status: AC
  Administered 2019-04-19: 4 mg via INTRAVENOUS
  Filled 2019-04-19: qty 1

## 2019-04-19 MED ORDER — HYDRALAZINE HCL 20 MG/ML IJ SOLN: 10.0000 mg | Freq: Three times a day (TID) | INTRAMUSCULAR | Status: DC | PRN

## 2019-04-19 MED ORDER — ALBUTEROL SULFATE 0.63 MG/3ML IN NEBU: 1.0000 | INHALATION_SOLUTION | Freq: Four times a day (QID) | RESPIRATORY_TRACT | Status: DC | PRN

## 2019-04-19 MED ORDER — IOHEXOL 350 MG/ML SOLN
100.0000 mL | Freq: Once | INTRAVENOUS | Status: AC | PRN
  Administered 2019-04-19: 100 mL via INTRAVENOUS

## 2019-04-19 MED ORDER — IOHEXOL 300 MG/ML  SOLN
125.0000 mL | Freq: Once | INTRAMUSCULAR | Status: AC | PRN
  Administered 2019-04-19: 125 mL via INTRAVENOUS

## 2019-04-19 MED ORDER — MORPHINE SULFATE (PF) 2 MG/ML IV SOLN
2.0000 mg | Freq: Once | INTRAVENOUS | Status: AC
  Administered 2019-04-19: 2 mg via INTRAVENOUS
  Filled 2019-04-19: qty 1

## 2019-04-19 MED ORDER — ONDANSETRON HCL 4 MG/2ML IJ SOLN
4.0000 mg | Freq: Once | INTRAMUSCULAR | Status: AC
  Administered 2019-04-19: 4 mg via INTRAVENOUS
  Filled 2019-04-19: qty 2

## 2019-04-19 NOTE — Consult Note (Signed)
[facility] [place]  Patient name: Tina Duarte MRN: [id number] DOB: 27-Dec-1945 Sex: female   REQUESTING PROVIDER:    ER   REASON FOR CONSULT:    RP mass  HISTORY OF PRESENT ILLNESS:   Tina Duarte is a 74 y.o. female, who presented to the [facility] emergency department with a 3 to 4-day history of upper abdominal pain.  She denies nausea or vomiting.  She had a CT scan which showed a retroperitoneal mass.  This was followed up with a CT angiogram which suggested a retroperitoneal hematoma.  There was possible extravasation.  Patient suffers from [place].  She is on chronic anticoagulation with Coumadin.  Her INR was 4.8 on arrival.  Her abdominal pain is significant and appears to be getting worse.  The patient is medically managed for hypertension with an ARB.  She is on a statin for hypercholesterolemia.  She is a non-smoker.  PAST MEDICAL HISTORY    Past Medical History:  Diagnosis Date  . Allergy   . Asthma   . Chicken pox   . History of blood clots   . Hypercholesterolemia   . Hypertension      FAMILY HISTORY   Family History  Problem Relation Age of Onset  . Cancer Mother        Breast  . Heart disease Mother   . Stroke Mother   . Hypertension Mother   . Breast cancer Mother        73's  . Heart disease Father   . Stroke Father   . Hypertension Father   . Diabetes Father   . Colon cancer Other        paternal cousin    SOCIAL HISTORY:   Social History   Socioeconomic History  . Marital status: Widowed    Spouse name: Not on file  . Number of children: Not on file  . Years of education: Not on file  . Highest education level: Not on file  Occupational History  . Not on file  Tobacco Use  . Smoking status: Never Smoker  . Smokeless tobacco: Never Used  Substance and Sexual Activity  . Alcohol use: No    Alcohol/week: 0.0 standard drinks  . Drug use: No  . Sexual activity: Not  on file  Other Topics Concern  . Not on file  Social History Narrative  . Not on file   Social Determinants of Health   Financial Resource Strain: Low Risk   . Difficulty of Paying Living Expenses: Not hard at all  Food Insecurity: No Food Insecurity  . Worried About Charity fundraiser in the Last Year: Never true  . Ran Out of Food in the Last Year: Never true  Transportation Needs: No Transportation Needs  . Lack of Transportation (Medical): No  . Lack of Transportation (Non-Medical): No  Physical Activity: Unknown  . Days of Exercise per Week: 0 days  . Minutes of Exercise per Session: Not on file  Stress: No Stress Concern Present  . Feeling of Stress : Not at all  Social Connections:   . Frequency of Communication with Friends and Family:   . Frequency of Social Gatherings with Friends and Family:   . Attends Religious Services:   . Active Member of Clubs or Organizations:   . Attends Archivist Meetings:   Marland Kitchen Marital Status:   Intimate Partner Violence:   . Fear of Current or Ex-Partner:   .  Emotionally Abused:   Marland Kitchen Physically Abused:   . Sexually Abused:     ALLERGIES:    No Known Allergies  CURRENT MEDICATIONS:    Current Facility-Administered Medications  Medication Dose Route Frequency Provider Last Rate Last Admin  . 0.9 %  sodium chloride infusion  10 mL/hr Intravenous Once Earleen Newport, MD      . iohexol (OMNIPAQUE) 9 MG/ML oral solution 500 mL  500 mL Oral Once PRN Paulette Blanch, MD   500 mL at 04/19/19 0650  . phytonadione (VITAMIN K) 10 mg in dextrose 5 % 50 mL IVPB  10 mg Intravenous Once Earleen Newport, MD 50 mL/hr at 04/19/19 1102 10 mg at 04/19/19 1102   Current Outpatient Medications  Medication Sig Dispense Refill  . budesonide (PULMICORT) 0.5 MG/2ML nebulizer solution Take 2 mLs by nebulization 2 (two) times daily.    . citalopram (CELEXA) 20 MG tablet Take 1 tablet (20 mg total) by mouth daily. 90 tablet 1  . losartan  (COZAAR) 50 MG tablet TAKE 1 TABLET BY MOUTH ONCE A DAY 90 tablet 1  . rosuvastatin (CRESTOR) 5 MG tablet Take 1 tablet (5 mg total) by mouth daily. 90 tablet 1  . warfarin (COUMADIN) 5 MG tablet Take 5 mg on Wednesday and Sunday, 2.5 mg all other days. (Patient taking differently: Take 2.5-5 mg by mouth daily in the afternoon. 5MG -SUNDAY AND WEDNESDAY 2.5MG -MONDAY,TUESDAY,THURSDAY,FRIDAY, AND SUNDAY) 30 tablet 1  . acetaminophen (TYLENOL) 650 MG CR tablet Take 650 mg by mouth every 8 (eight) hours as needed for pain.    Marland Kitchen albuterol (ACCUNEB) 0.63 MG/3ML nebulizer solution Take 3 mLs (0.63 mg total) by nebulization every 6 (six) hours as needed for wheezing. 75 mL 12  . PROAIR HFA 108 (90 Base) MCG/ACT inhaler Inhale 2 puffs into the lungs every 6 (six) hours as needed for wheezing.       REVIEW OF SYSTEMS:   [X]  denotes positive finding, [ ]  denotes negative finding Cardiac  Comments:  Chest pain or chest pressure:    Shortness of breath upon exertion:    Short of breath when lying flat:    Irregular heart rhythm:        Vascular    Pain in calf, thigh, or hip brought on by ambulation:    Pain in feet at night that wakes you up from your sleep:     Blood clot in your veins:    Leg swelling:         Pulmonary    Oxygen at home:    Productive cough:     Wheezing:         Neurologic    Sudden weakness in arms or legs:     Sudden numbness in arms or legs:     Sudden onset of difficulty speaking or slurred speech:    Temporary loss of vision in one eye:     Problems with dizziness:         Gastrointestinal    Blood in stool:      Vomited blood:         Genitourinary    Burning when urinating:     Blood in urine:        Psychiatric    Major depression:         Hematologic    Bleeding problems:    Problems with blood clotting too easily:        Skin    Rashes or ulcers:  Constitutional    Fever or chills:     PHYSICAL EXAM:   Vitals:   04/19/19 0800  04/19/19 0930 04/19/19 1000 04/19/19 1030  BP: (!) 181/84 (!) 178/95 (!) 165/70 (!) 161/80  Pulse: 68 71 69 76  Resp: 12     Temp:      TempSrc:      SpO2: 96% 98% 97% 95%  Weight:      Height:        GENERAL: The patient is a well-nourished female, in no acute distress. The vital signs are documented above. CARDIAC: There is a regular rate and rhythm.  VASCULAR: Palpable pedal pulses PULMONARY: Nonlabored respirations ABDOMEN: Soft but tender to palpation MUSCULOSKELETAL: There are no major deformities or cyanosis. NEUROLOGIC: No focal weakness or paresthesias are detected. SKIN: There are no ulcers or rashes noted. PSYCHIATRIC: The patient has a normal affect.  STUDIES:   I have reviewed the following CT scan: 1. 9.9 cm right retroperitoneal complex collection, with probable hematocrit level, without convincing evidence of active extravasation. Potential etiologies include duodenal ulcer, pseudoaneurysm, pancreatic pseudocyst, or mass. 2. Small umbilical hernia containing only fat.  Aortic Atherosclerosis (ICD10-I70.0).  CTA: 1. Active extravasation into right retroperitoneal hematoma, which has slightly enlarged since earlier scan. No definite pseudoaneurysm or etiology of bleeding identified.  ASSESSMENT and PLAN   Retroperitoneal hematoma: I have reviewed and discussed the patient's CT scan and CT angiogram with Dr. Vernard Gambles of interventional radiology.  This appears to be a retroperitoneal hematoma with active extravasation.  The hematoma does appear to be enlarging in size, and the patient appears to be more more uncomfortable.  The patient's hemoglobin on arrival was 13.3.  I have recommended correcting her coagulation profile with FFP and vitamin K.  She is most likely going to require mesenteric angiography and possible embolization.  This will be done by interventional radiology which will necessitate transfer to [facility] for the procedure.  I have discussed this  with the emergency room and hospitalist service.   Leia Alf, MD, FACS [facility] [phone] Pager [phone]

## 2019-04-19 NOTE — ED Notes (Signed)
Pt unable to sign transfer consent due to altered mental status, pt daughter notified by Dr. Jimmye Norman that pt was being [facility]

## 2019-04-19 NOTE — ED Notes (Signed)
INR 4.75

## 2019-04-19 NOTE — H&P (Signed)
.  History and Physical    Tina Duarte [id number] DOB: 03-29-1945 DOA: 04/19/2019  PCP: Einar Pheasant, MD  Patient coming from: [place]   Chief Complaint: Abdominal pain.   HPI: Tina Duarte is a 74 y.o. female with medical history significant of Factor V Leiden, HTN, chronic respiratory failure on 1L [place] at home. Was seen at [facility] for abdominal pain. She is unable to tell me the full story as she is intermittently confused. However, chart review shows that she had about 4 days of abdominal pain that was achy and constant. She's had poor appetite during this time. She denied any bowel habit changes at the time. She denies any urinary changes at the time. She denies any other alleviating or aggravating factors. She was evaluated by their ED and found to have a retroperitoneal hematoma. She was also found to have an INR of 4.8. A CTA was obtained that revealed the possibility of extravasation. She was evaluated by vascular surgery who suggested coagulation reversal and transfer to [facility] for possible IR intervention w/ angiography and embolization.   Review of Systems: She is reporting N, V, ab pain. Remainder of 10 point review of systems is otherwise negative for all not mentioned in HPI.    Past Medical History:  Diagnosis Date  . Allergy   . Asthma   . Chicken pox   . History of blood clots   . Hypercholesterolemia   . Hypertension     Past Surgical History:  Procedure Laterality Date  . blood clots  2008     reports that she has never smoked. She has never used smokeless tobacco. She reports that she does not drink alcohol or use drugs.  No Known Allergies  Family History  Problem Relation Age of Onset  . Cancer Mother        Breast  . Heart disease Mother   . Stroke Mother   . Hypertension Mother   . Breast cancer Mother        63's  . Heart disease Father   . Stroke Father   . Hypertension Father   . Diabetes Father   . Colon cancer Other        paternal  cousin    Prior to Admission medications   Medication Sig Start Date End Date Taking? Authorizing Provider  acetaminophen (TYLENOL) 650 MG CR tablet Take 650 mg by mouth every 8 (eight) hours as needed for pain.    [provider]  albuterol (ACCUNEB) 0.63 MG/3ML nebulizer solution Take 3 mLs (0.63 mg total) by nebulization every 6 (six) hours as needed for wheezing. 03/20/18   Flora Lipps, MD  budesonide (PULMICORT) 0.5 MG/2ML nebulizer solution Take 2 mLs by nebulization 2 (two) times daily. 03/25/19   [provider]  citalopram (CELEXA) 20 MG tablet Take 1 tablet (20 mg total) by mouth daily. 03/26/19   Einar Pheasant, MD  losartan (COZAAR) 50 MG tablet TAKE 1 TABLET BY MOUTH ONCE A DAY 03/26/19   Einar Pheasant, MD  PROAIR HFA 108 [phone] Base) MCG/ACT inhaler Inhale 2 puffs into the lungs every 6 (six) hours as needed for wheezing.  01/31/18   [provider]  rosuvastatin (CRESTOR) 5 MG tablet Take 1 tablet (5 mg total) by mouth daily. 03/26/19   Einar Pheasant, MD  warfarin (COUMADIN) 5 MG tablet Take 5 mg on Wednesday and Sunday, 2.5 mg all other days. Patient taking differently: Take 2.5-5 mg by mouth daily in the afternoon.  5MG -SUNDAY AND WEDNESDAY 2.5MG -MONDAY,TUESDAY,THURSDAY,FRIDAY, AND SUNDAY 03/26/19   Einar Pheasant, MD    Physical Exam: Vitals:   04/19/19 1727  BP: (!) 171/78  Pulse: 77  Resp: (!) 21  Temp: 98 F (36.7 C)  TempSrc: Oral  SpO2: 97%    Constitutional: 74 y.o. female NAD, calm, comfortable Vitals:   04/19/19 1727  BP: (!) 171/78  Pulse: 77  Resp: (!) 21  Temp: 98 F (36.7 C)  TempSrc: Oral  SpO2: 97%   General: 74 y.o. female resting in bed in mild distress Eyes: PERRL, normal sclera ENMT: Nares patent w/o discharge, orophaynx clear, dentition normal, ears w/o discharge/lesions/ulcers Cardiovascular: RRR, +S1, S2, no m/g/r, equal pulses throughout Respiratory: CTABL, no w/r/r, normal WOB GI: BS+, obese, NT , no masses  noted, no organomegaly noted MSK: No e/c/c Skin: No rashes, ulcerations noted; ecchymosis c/w coumadin use noted Neuro: A&O x 3, no focal deficits; however, she is intermittently confused during interview Psyc: agitated, but cooperative  Labs on Admission: I have personally reviewed following labs and imaging studies  CBC: Recent Labs  Lab 04/19/19 0049 04/19/19 1032  WBC 8.7 12.6*  HGB 13.7 13.3  HCT 42.7 41.5  MCV 87.9 89.1  PLT 285 [id number]   Basic Metabolic Panel: Recent Labs  Lab 04/19/19 0049  NA 139  K 3.2*  CL 101  CO2 26  GLUCOSE 137*  BUN 7*  CREATININE 0.88  CALCIUM 8.8*   GFR: Estimated Creatinine Clearance: 67.8 mL/min (by C-G formula based on SCr of 0.88 mg/dL). Liver Function Tests: Recent Labs  Lab 04/19/19 0049  AST 19  ALT 11  ALKPHOS 69  BILITOT 1.4*  PROT 7.5  ALBUMIN 4.2   Recent Labs  Lab 04/19/19 0049  LIPASE 23   No results for input(s): AMMONIA in the last 168 hours. Coagulation Profile: Recent Labs  Lab 04/19/19 0514  INR 4.8*   Cardiac Enzymes: No results for input(s): CKTOTAL, CKMB, CKMBINDEX, TROPONINI in the last 168 hours. BNP (last 3 results) No results for input(s): PROBNP in the last 8760 hours. HbA1C: No results for input(s): HGBA1C in the last 72 hours. CBG: No results for input(s): GLUCAP in the last 168 hours. Lipid Profile: No results for input(s): CHOL, HDL, LDLCALC, TRIG, CHOLHDL, LDLDIRECT in the last 72 hours. Thyroid Function Tests: No results for input(s): TSH, T4TOTAL, FREET4, T3FREE, THYROIDAB in the last 72 hours. Anemia Panel: No results for input(s): VITAMINB12, FOLATE, FERRITIN, TIBC, IRON, RETICCTPCT in the last 72 hours. Urine analysis:    Component Value Date/Time   COLORURINE YELLOW (A) 04/19/2019 0049   APPEARANCEUR CLEAR (A) 04/19/2019 0049   APPEARANCEUR Hazy 02/23/2013 1407   LABSPEC 1.011 04/19/2019 0049   LABSPEC 1.010 02/23/2013 1407   PHURINE 7.0 04/19/2019 0049   GLUCOSEU  NEGATIVE 04/19/2019 0049   GLUCOSEU Negative 02/23/2013 1407   GLUCOSEU NEGATIVE 02/10/2013 1032   HGBUR MODERATE (A) 04/19/2019 0049   BILIRUBINUR NEGATIVE 04/19/2019 0049   BILIRUBINUR neg 08/10/2014 1055   BILIRUBINUR Negative 02/23/2013 1407   KETONESUR 5 (A) 04/19/2019 0049   PROTEINUR NEGATIVE 04/19/2019 0049   UROBILINOGEN 0.2 08/10/2014 1055   UROBILINOGEN 0.2 02/10/2013 1032   NITRITE NEGATIVE 04/19/2019 0049   LEUKOCYTESUR TRACE (A) 04/19/2019 0049   LEUKOCYTESUR 3+ 02/23/2013 1407    Radiological Exams on Admission: DG Chest 2 View  Result Date: 04/19/2019 CLINICAL DATA:  Abdominal pain EXAM: CHEST - 2 VIEW COMPARISON:  01/31/2018 FINDINGS: The heart size and mediastinal contours are within  normal limits. Both lungs are clear. The visualized skeletal structures are unremarkable. IMPRESSION: No active cardiopulmonary disease. Electronically Signed   By: Ulyses Jarred M.D.   On: 04/19/2019 05:46   CT Abdomen Pelvis W Contrast  Result Date: 04/19/2019 CLINICAL DATA:  Upper abdominal pain x3 days EXAM: CT ABDOMEN AND PELVIS WITH CONTRAST TECHNIQUE: Multidetector CT imaging of the abdomen and pelvis was performed using the standard protocol following bolus administration of intravenous contrast. CONTRAST:  147mL OMNIPAQUE IOHEXOL 300 MG/ML  SOLN COMPARISON:  Ultrasound from the same day, and previous FINDINGS: Lower chest: Minimal dependent atelectasis in the lung bases. No pleural or pericardial effusion. Hepatobiliary: Stable subcentimeter low-attenuation nonspecific lesion in hepatic segment 7 (Im16,Se2) . no biliary ductal dilatation. Gallbladder unremarkable. Pancreas: No mass or ductal dilatation. Pancreatic displacement by retroperitoneal collection. Spleen: Normal in size without focal abnormality. Adrenals/Urinary Tract: Adrenals unremarkable. Kidneys enhance normally, without mass or hydronephrosis. Urinary bladder incompletely distended Stomach/Bowel: Stomach is incompletely  distended, unremarkable. Duodenum is nondistended, displaced anteriorly by retroperitoneal process. Remainder of small bowel is nondistended. Appendix not discretely identified The colon is nondilated, unremarkable. Vascular/Lymphatic: Aortoiliac Aortic Atherosclerosis (ICD10-170.0) without aneurysm. No abdominal or pelvic adenopathy. Reproductive: Uterus and bilateral adnexa are unremarkable. Other: There is a 9.9 x 7.2 x 5.5 cm right retroperitoneal complex collection displacing the pancreatic head and distal duodenum anteriorly. There is a thickened somewhat nodular rim. Fluid level within the collection suggests blood, less likely enteric contents in the absence of any gas bubbles. Hyperdense foci at the interface persist on delayed scans, without any convincing evidence of active extravasation. There are surrounding retroperitoneal inflammatory/edematous changes. No ascites. No free air. Musculoskeletal: Small umbilical hernia containing only fat. Multilevel spondylitic changes in lower thoracic and lumbar spine. Bilateral hip DJD. No fracture or worrisome bone lesion. IMPRESSION: 1. 9.9 cm right retroperitoneal complex collection, with probable hematocrit level, without convincing evidence of active extravasation. Potential etiologies include duodenal ulcer, pseudoaneurysm, pancreatic pseudocyst, or mass. 2. Small umbilical hernia containing only fat. Aortic Atherosclerosis (ICD10-I70.0). Electronically Signed   By: Lucrezia Europe M.D.   On: 04/19/2019 10:02   CT Angio Abd/Pel W and/or Wo Contrast  Result Date: 04/19/2019 CLINICAL DATA:  Retroperitoneal hematoma, rule out pseudoaneurysm EXAM: CTA ABDOMEN AND PELVIS WITHOUT AND WITH CONTRAST TECHNIQUE: Multidetector CT imaging of the abdomen and pelvis was performed using the standard protocol during bolus administration of intravenous contrast. Multiplanar reconstructed images and MIPs were obtained and reviewed to evaluate the vascular anatomy. CONTRAST:   184mL OMNIPAQUE IOHEXOL 350 MG/ML SOLN COMPARISON:  Earlier CT of the same day FINDINGS: VASCULAR Aorta: Moderate calcified atheromatous plaque. No dissection, aneurysm, or stenosis. Celiac: Patent without evidence of aneurysm, dissection, vasculitis or significant stenosis. SMA: Patent with classic distal branch anatomy. No evidence of aneurysm, dissection, vasculitis or significant stenosis. Renals: Single left, patent Single right, patent IMA: Patent without evidence of aneurysm, dissection, vasculitis or significant stenosis. Inflow: Mild scattered atheromatous plaque. No aneurysm, dissection, or stenosis. Proximal Outflow: Bilateral common femoral and visualized portions of the superficial and profunda femoral arteries are patent without evidence of aneurysm, dissection, vasculitis or significant stenosis. Veins: Patent hepatic veins, portal vein, SMV, splenic vein, bilateral renal veins, IVC, iliac venous system. No venous pathology evident. Review of the MIP images confirms the above findings. NON-VASCULAR Lower chest: No pleural or pericardial effusion. Linear subpleural opacities posteriorly in the lung bases. Hepatobiliary: No focal liver abnormality is seen. No gallstones, gallbladder wall thickening, or biliary dilatation. Pancreas: No mass  or ductal dilatation. Spleen: Normal in size without focal abnormality. Adrenals/Urinary Tract: Adrenal glands unremarkable 1.1 cm probable cyst, mid pole left kidney. No hydronephrosis. Urinary bladder physiologically distended. Stomach/Bowel: Stomach is incompletely distended with oral contrast material. The duodenum is nondilated, displaced anteriorly by retroperitoneal hematoma. Distal small bowel decompressed. Colon is nondilated, unremarkable. Lymphatic: No abdominal or pelvic adenopathy. Reproductive: Uterus and bilateral adnexa are unremarkable. Other: 10x8 x 6.5cm enlarging right retroperitoneal hematoma posterior to the duodenum and pancreatic head. There is  no definite associated pseudoaneurysm although progressive contrast accumulation is evident in the collection on the delayed scan consistent with continued active extravasation, possibly from pancreaticoduodenal arterial branch. Persistent surrounding retroperitoneal inflammatory/edematous changes. No free air.  No ascites. Musculoskeletal: Small umbilical hernia containing mesenteric fat. Multilevel lumbar spondylitic changes. Bilateral hip DJD. No fracture or worrisome bone lesion. IMPRESSION: 1. Active extravasation into right retroperitoneal hematoma, which has slightly enlarged since earlier scan. No definite pseudoaneurysm or etiology of bleeding identified. Critical Value/emergent results were discussed by telephone at the time of interpretation on 04/19/2019 at 12:59 pm to with Dr. Trula Slade, who verbally acknowledged these results. Electronically Signed   By: Lucrezia Europe M.D.   On: 04/19/2019 13:00   US ABDOMEN LIMITED RUQ  Result Date: 04/19/2019 CLINICAL DATA:  Upper abdominal pain for 3 days. EXAM: ULTRASOUND ABDOMEN LIMITED RIGHT UPPER QUADRANT COMPARISON:  None. FINDINGS: Gallbladder: The gallbladder is distended without wall thickening, pericholecystic fluid, stone, sludge or Murphy's sign. Common bile duct: Diameter: 3.6 mm Liver: No focal lesion identified. Within normal limits in parenchymal echogenicity. Portal vein is patent on color Doppler imaging with normal direction of blood flow towards the liver. Other: There is a 9.3 x 7.2 x 5.2 cm rounded masslike collection with a fluid fluid level inferior to the liver and gallbladder. This does not definitely arise from the aorta or the right kidney. IMPRESSION: 1. There is a 9.3 x 7.2 x 5.2 cm rounded masslike collection with a fluid fluid level inferior to the liver and gallbladder. This masslike collection does not definitely arise from the aorta or right kidney. No internal blood flow. No correlate for this masslike collection was seen on the CT  scan from January 2015. Recommend a CT scan of the abdomen and pelvis with intravenous and oral contrast. 2. The gallbladder is distended but otherwise unremarkable. No stones, wall thickening, pericholecystic fluid, or Murphy's sign. Electronically Signed   By: Dorise Bullion III M.D   On: 04/19/2019 06:37    Assessment/Plan Principal Problem:   Retroperitoneal hematoma Active Problems:   Essential hypertension, benign   Hypokalemia   Abdominal pain   Supratherapeutic INR   Factor V Leiden (HCC)   Retroperitoneal hematoma Abdominal pain Supratherapeutic INR     - s/p 2 units FFP and vit K at [place]; repeat INR now and in AM     - if INR does not stabilize and she becomes unstable, will need to get IR involved and get mesenteric angiography and embolization; spoke with IR (Dr. Laurence Ferrari), they are aware of the patient     - admit to inpt, med-tele; expect greater than 2MN stay     - trend H&H, transfuse as necessary  N/V     - zofran, fluids     - NPO for now  Hypokalemia     - repeat labs, replace as necessary     - check Mg2+  Factor V Leiden     - normally on coumadin     -  she is supratherapeutic; hold for now     - will consult pharmacy for dosing when stable  HTN     - N/V right now; so can't take home meds     - will add PRN hydralazine for SBP >170   DVT prophylaxis: SCDs  Code Status: FULL  Family Communication: Spoke with dtr Jolyn Nap [phone]) by phone and updated her on plan.   Disposition Plan: Admit to inpatient. Significant change of collapse without inpatient monitoring. Possible need for ongoing reversal of anticoagulation and bleeding.  Consults called: Spoke with Dr. Trula Slade of Vasc Surg and Dr. Laurence Ferrari of IR   Admission status: Admit to inpatient. Significant change of collapse without inpatient monitoring. Possible need for ongoing reversal of anticoagulation and bleeding.    Jonnie Finner DO [facility]  If 7PM-7AM,  please contact night-coverage [email or web address]  04/19/2019, 5:56 PM

## 2019-04-19 NOTE — ED Notes (Signed)
Pt continues to have periods of alertness, and yelling out in pain and attempting to get up from bed, then pt will very quickly fall asleep of several seconds to mins before waking up and attempting to get up and calling out in pain again. Pt is currently confused and only able to state name and birthday. Pt appears to be unable to understand current situation. Pt unable to follow commands, and continues to attempt getting up, when pt sits forward placing pressure on abdomen pt begins to experience nausea and begins dry heaving

## 2019-04-19 NOTE — ED Provider Notes (Addendum)
[facility] Emergency Department Provider Note   ____________________________________________   None    (approximate)  I have reviewed the triage vital signs and the nursing notes.   HISTORY  Chief Complaint Abdominal Pain    HPI Tina Duarte is a 74 y.o. female who presents to the ED from home with a chief complaint of abdominal pain.  Patient has a history remarkable for hypertension, hyperlipidemia, factor V Leiden deficiency, on 1 L nasal cannula oxygen at night which she does not wear.  Patient reports upper abdominal pain for the past 3 to 4 days.  Symptoms associated with anorexia.  Describes constant and achy type pain without radiation.  Denies associated fever, chest pain, shortness of breath, nausea, vomiting or diarrhea.  Denies recent travel or trauma.  Patient takes warfarin for factor V Leiden deficiency.       Past Medical History:  Diagnosis Date  . Allergy   . Asthma   . Chicken pox   . History of blood clots   . Hypercholesterolemia   . Hypertension     Patient Active Problem List   Diagnosis Date Noted  . Left knee pain 12/15/2018  . Weakness 08/25/2018  . Respiratory failure with hypoxia ([place]) 02/10/2018  . Acute bronchitis 01/31/2018  . Muscle fatigue 07/24/2017  . Hyperglycemia 09/12/2016  . Sinusitis 04/05/2015  . Sore throat 10/17/2014  . Health care maintenance 09/20/2014  . LOC (loss of consciousness) ([place]) 06/09/2014  . Heel pain 05/31/2014  . Long term current use of anticoagulant therapy 04/04/2014  . Leg pain 02/16/2014  . Low back pain 12/28/2013  . Stress 09/07/2013  . Cough 04/16/2013  . Left hip pain 12/05/2012  . 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 Rib pain 08/28/2012  . Essential hypertension, benign 05/22/2012  . Hypercholesterolemia 05/22/2012  . Environmental allergies 05/22/2012  . Asthma 05/22/2012  . History of blood clots 05/22/2012  . Diarrhea 05/22/2012    Past Surgical History:   Procedure Laterality Date  . blood clots  2008    Prior to Admission medications   Medication Sig Start Date End Date Taking? Authorizing Provider  acetaminophen (TYLENOL) 650 MG CR tablet Take 650 mg by mouth every 8 (eight) hours as needed for pain.    [provider]  albuterol (ACCUNEB) 0.63 MG/3ML nebulizer solution Take 3 mLs (0.63 mg total) by nebulization every 6 (six) hours as needed for wheezing. 03/20/18   Flora Lipps, MD  citalopram (CELEXA) 20 MG tablet Take 1 tablet (20 mg total) by mouth daily. 03/26/19   Einar Pheasant, MD  Coenzyme Q10 (COQ10) 30 MG CAPS Take by mouth.    [provider]  losartan (COZAAR) 50 MG tablet TAKE 1 TABLET BY MOUTH ONCE A DAY 03/26/19   Einar Pheasant, MD  PROAIR HFA 108 [phone] Base) MCG/ACT inhaler INL 2 PFS ITL Q 6 H PRF WHZ OR SOB 01/31/18   [provider]  rosuvastatin (CRESTOR) 5 MG tablet Take 1 tablet (5 mg total) by mouth daily. 03/26/19   Einar Pheasant, MD  warfarin (COUMADIN) 5 MG tablet Take 5 mg on Wednesday and Sunday, 2.5 mg all other days. 03/26/19   Einar Pheasant, MD    Allergies Patient has no known allergies.  Family History  Problem Relation Age of Onset  . Cancer Mother        Breast  . Heart disease Mother   . Stroke Mother   . Hypertension Mother   .  Breast cancer Mother        83's  . Heart disease Father   . Stroke Father   . Hypertension Father   . Diabetes Father   . Colon cancer Other        paternal cousin    Social History Social History   Tobacco Use  . Smoking status: Never Smoker  . Smokeless tobacco: Never Used  Substance Use Topics  . Alcohol use: No    Alcohol/week: 0.0 standard drinks  . Drug use: No    Review of Systems  Constitutional: No fever/chills Eyes: No visual changes. ENT: No sore throat. Cardiovascular: Denies chest pain. Respiratory: Denies shortness of breath. Gastrointestinal: Positive for abdominal pain.  No nausea, no vomiting.  No diarrhea.   No constipation. Genitourinary: Negative for dysuria. Musculoskeletal: Negative for back pain. Skin: Negative for rash. Neurological: Negative for headaches, focal weakness or numbness.   ____________________________________________   PHYSICAL EXAM:  VITAL SIGNS: ED Triage Vitals  Enc Vitals Group     BP 04/19/19 0043 (!) 163/75     Pulse Rate 04/19/19 0043 86     Resp 04/19/19 0043 20     Temp 04/19/19 0043 98.1 F (36.7 C)     Temp Source 04/19/19 0043 Oral     SpO2 04/19/19 0043 96 %     Weight 04/19/19 0044 240 lb (108.9 kg)     Height 04/19/19 0044 5\' 5"  (1.651 m)     Head Circumference --      Peak Flow --      Pain Score 04/19/19 0043 4     Pain Loc --      Pain Edu? --      Excl. in [place]? --     Constitutional: Alert and oriented. Well appearing and in mild to moderate acute distress.  More comfortable laying on her right side. Eyes: Conjunctivae are normal. PERRL. EOMI. Head: Atraumatic. Nose: No congestion/rhinnorhea. Mouth/Throat: Mucous membranes are moist.  Oropharynx non-erythematous. Neck: No stridor.   Cardiovascular: Normal rate, regular rhythm. Grossly normal heart sounds.  Good peripheral circulation. Respiratory: Normal respiratory effort.  No retractions. Lungs CTAB. Gastrointestinal: Soft and moderately tender to palpation epigastrium and right upper quadrant without rebound or guarding. No distention. No abdominal bruits. No CVA tenderness. Musculoskeletal: No lower extremity tenderness nor edema.  No joint effusions. Neurologic:  Normal speech and language. No gross focal neurologic deficits are appreciated.  Skin:  Skin is warm, dry and intact. No rash noted. Psychiatric: Mood and affect are normal. Speech and behavior are normal.  ____________________________________________   LABS (all labs ordered are listed, but only abnormal results are displayed)  Labs Reviewed  COMPREHENSIVE METABOLIC PANEL - Abnormal; Notable for the following  components:      Result Value   Potassium 3.2 (*)    Glucose, Bld 137 (*)    BUN 7 (*)    Calcium 8.8 (*)    Total Bilirubin 1.4 (*)    All other components within normal limits  URINALYSIS, COMPLETE (UACMP) WITH MICROSCOPIC - Abnormal; Notable for the following components:   Color, Urine YELLOW (*)    APPearance CLEAR (*)    Hgb urine dipstick MODERATE (*)    Ketones, ur 5 (*)    Leukocytes,Ua TRACE (*)    All other components within normal limits  PROTIME-INR - Abnormal; Notable for the following components:   Prothrombin Time 44.7 (*)    INR 4.8 (*)    All other components  within normal limits  TROPONIN I (HIGH SENSITIVITY) - Abnormal; Notable for the following components:   Troponin I (High Sensitivity) 23 (*)    All other components within normal limits  TROPONIN I (HIGH SENSITIVITY) - Abnormal; Notable for the following components:   Troponin I (High Sensitivity) 23 (*)    All other components within normal limits  RESPIRATORY PANEL BY RT PCR (FLU A&B, COVID)  LIPASE, BLOOD  CBC   ____________________________________________  EKG  ED ECG REPORT I, Jerline Linzy J, the attending physician, personally viewed and interpreted this ECG.   Date: 04/19/2019  EKG Time: 0044  Rate: 88  Rhythm: normal EKG, normal sinus rhythm  Axis: Normal  Intervals:none  ST&T Change: Nonspecific  ____________________________________________  RADIOLOGY  ED MD interpretation: Chest x-ray demonstrates no acute cardiopulmonary process; ultrasound demonstrates 9 x 5 cm masslike collection with fluid level inferior to the liver and gallbladder which is new from CT scan dated January 2015.  Recommend CT scan abdomen/pelvis with IV and oral contrast.  Gallbladder unremarkable.  Official radiology report(s): DG Chest 2 View  Result Date: 04/19/2019 CLINICAL DATA:  Abdominal pain EXAM: CHEST - 2 VIEW COMPARISON:  01/31/2018 FINDINGS: The heart size and mediastinal contours are within normal  limits. Both lungs are clear. The visualized skeletal structures are unremarkable. IMPRESSION: No active cardiopulmonary disease. Electronically Signed   By: Ulyses Jarred M.D.   On: 04/19/2019 05:46   US ABDOMEN LIMITED RUQ  Result Date: 04/19/2019 CLINICAL DATA:  Upper abdominal pain for 3 days. EXAM: ULTRASOUND ABDOMEN LIMITED RIGHT UPPER QUADRANT COMPARISON:  None. FINDINGS: Gallbladder: The gallbladder is distended without wall thickening, pericholecystic fluid, stone, sludge or Murphy's sign. Common bile duct: Diameter: 3.6 mm Liver: No focal lesion identified. Within normal limits in parenchymal echogenicity. Portal vein is patent on color Doppler imaging with normal direction of blood flow towards the liver. Other: There is a 9.3 x 7.2 x 5.2 cm rounded masslike collection with a fluid fluid level inferior to the liver and gallbladder. This does not definitely arise from the aorta or the right kidney. IMPRESSION: 1. There is a 9.3 x 7.2 x 5.2 cm rounded masslike collection with a fluid fluid level inferior to the liver and gallbladder. This masslike collection does not definitely arise from the aorta or right kidney. No internal blood flow. No correlate for this masslike collection was seen on the CT scan from January 2015. Recommend a CT scan of the abdomen and pelvis with intravenous and oral contrast. 2. The gallbladder is distended but otherwise unremarkable. No stones, wall thickening, pericholecystic fluid, or Murphy's sign. Electronically Signed   By: Dorise Bullion III M.D   On: 04/19/2019 06:37    ____________________________________________   PROCEDURES  Procedure(s) performed (including Critical Care):  .1-3 Lead EKG Interpretation Performed by: Paulette Blanch, MD Authorized by: Paulette Blanch, MD     Interpretation: normal     ECG rate:  81   ECG rate assessment: normal     Rhythm: sinus rhythm     Ectopy: none     Conduction: normal   Comments:     Patient placed on cardiac  monitor to evaluate for arrhythmia     ____________________________________________   INITIAL IMPRESSION / ASSESSMENT AND PLAN / ED COURSE  As part of my medical decision making, I reviewed the following data within the [place] notes reviewed and incorporated, Labs reviewed, EKG interpreted, Old chart reviewed, Radiograph reviewed and Notes from prior  ED visits     Tina Duarte was evaluated in Emergency Department on 04/19/2019 for the symptoms described in the history of present illness. She was evaluated in the context of the global COVID-19 pandemic, which necessitated consideration that the patient might be at risk for infection with the SARS-CoV-2 virus that causes COVID-19. Institutional protocols and algorithms that pertain to the evaluation of patients at risk for COVID-19 are in a state of rapid change based on information released by regulatory bodies including the CDC and federal and state organizations. These policies and algorithms were followed during the patient's care in the ED.    74 year old female on warfarin for factor V Leiden deficiency who presents with upper abdominal pain. Differential diagnosis includes, but is not limited to, biliary disease (biliary colic, acute cholecystitis, cholangitis, choledocholithiasis, etc), intrathoracic causes for epigastric abdominal pain including ACS, gastritis, duodenitis, pancreatitis, small bowel or large bowel obstruction, abdominal aortic aneurysm, hernia, and ulcer(s).  Normal WBC, mildly elevated T bili.  Two sets of troponins unremarkable.  Will obtain PT/INR, chest x-ray and ultrasound abdomen.  Administer IV analgesia with antiemetic.   Clinical Course as of Apr 18 709  Sat Apr 19, 2019  0703 CT abdomen/pelvis ordered per radiology recommendations to further evaluate abnormality seen on ultrasound.  Care is transferred at end of shift to Dr. Jimmye Norman.  Anticipate patient will require  hospitalization.   [JS]  [id number] INR noted.  No evidence of active bleeding.  Hold reversal agent for now.   [JS]    Clinical Course User Index [JS] Paulette Blanch, MD     ____________________________________________   FINAL CLINICAL IMPRESSION(S) / ED DIAGNOSES  Final diagnoses:  Pain of upper abdomen     ED Discharge Orders    None       Note:  This document was prepared using Dragon voice recognition software and may include unintentional dictation errors.   Paulette Blanch, MD 04/19/19 [id number]    Paulette Blanch, MD 04/19/19 [phone]

## 2019-04-19 NOTE — ED Notes (Signed)
Patient transported to x-ray. ?

## 2019-04-19 NOTE — ED Notes (Signed)
Pt assisted up to toilet in room. Pt reports being unable to drink oral contrast due to nausea. Pt is currently experiencing nausea. MD notified

## 2019-04-19 NOTE — ED Notes (Addendum)
Pt shoes, socks, jeans, shirt, and tank top, and phone with case place in pt belongings bag and sent with [facility]

## 2019-04-19 NOTE — ED Notes (Signed)
Pt continues to report severe pain, pt attempting to sit up in bed. RN redirected pt to remain in bed. Pt appears weak and having trouble repositioning Sor[place] in bed. Due to potential fall risk RN instructed pt to remain in bed.

## 2019-04-19 NOTE — ED Notes (Signed)
emtala reviewed by this RN 

## 2019-04-19 NOTE — ED Notes (Signed)
Notified MD of critical INR of 4.8. Placed purewick on patient because she needed to urinate, told patient that in order for the purewick to work she needed to keep legs together and hold still. Patient is turning from side to side with severe pain, MD notified and ordered morphine

## 2019-04-19 NOTE — Progress Notes (Addendum)
Pt admitted today from [facility] ED.  Pt placed on telemtry and CCMD notified.  Admissions notified and MD paged and in route.  Pt in severe abdominal pain stating "10" on 0-10 scale. Pt producing small amounts of emesis.  Vitals stable with the exception of hypertension: BP 171/78 (106).  Pt's O2 saturation 97% on 3L [place]. Pt is A&O X 4 and neuro intact.with mild intermittent confusion.

## 2019-04-19 NOTE — H&P (Signed)
History and Physical    Tina Duarte [id number] DOB: Nov 26, 1945 DOA: 04/19/2019  PCP: Einar Pheasant, MD   Patient coming from: Home  I have personally briefly reviewed patient's old medical records in [place]  Chief Complaint: Abdominal pain  HPI: Tina Duarte is a 74 y.o. female with medical history significant for Factor V Leiden on chronic anticoagulation with Coumadin, hypertension, Chronic respiratory failure on 1L of oxygen who presents to the ER for evaluation of abdominal pain mostly in the upper abdomen for about 3 - 4 days. She describes the pain as achy and constant with no radiation.  Abdominal pain is associated with anorexia but she denies having any nausea, vomiting, diarrhea or changes in her bowel habits.  Denies having any shortness of breath, chest pain, diaphoresis or palpitations.  She denies having any urinary frequency, dysuria or nocturia. Patient had imaging studies done suggestive of a retroperitoneal hematoma.  INR is 4.8 and her H&H is stable. Patient had a CTA which showed active extravasation into right retroperitoneal hematoma, whichhas slightly enlarged since earlier scan. No definite pseudoaneurysm or etiology of bleeding identified. Imaging reviewed by vascular surgery who recommends transfer to [facility] for embolization.  ED Course: 74 year old female on warfarin for factor V Leiden deficiency who presents with upper abdominal pain.  Normal WBC, mildly elevated T bili.  Two sets of troponins unremarkable.  PT/INR was elevated andabdominal ultrasound showed  a 9.3 x 7.2 x 5.2 cm rounded masslike collection with a fluid  level inferior to the liver and gallbladder. This masslike collection does not definitely arise from the aorta or right kidney. No internal blood flow. No correlate for this masslike collection was seen on the CT scan from January 2015. Recommend a CT scan of the abdomen and pelvis with intravenous and oral.   Scan of the abdomen and  pelvis with IV and oral contrast showed, 9.9 cm right retroperitoneal complex collection, with probable hematocrit level, without convincing evidence of active extravasation. Potential etiologies include duodenal ulcer, pseudoaneurysm, pancreatic pseudocyst, or mass. 2. Small umbilical hernia containing only fat. Hemoglobin has remained stable Vascular surgery consult was requested and patient will be admitted for further evaluation and treatment.   Review of Systems: As per HPI otherwise 10 point review of systems negative.    Past Medical History:  Diagnosis Date  . Allergy   . Asthma   . Chicken pox   . History of blood clots   . Hypercholesterolemia   . Hypertension     Past Surgical History:  Procedure Laterality Date  . blood clots  2008     reports that she has never smoked. She has never used smokeless tobacco. She reports that she does not drink alcohol or use drugs.  No Known Allergies  Family History  Problem Relation Age of Onset  . Cancer Mother        Breast  . Heart disease Mother   . Stroke Mother   . Hypertension Mother   . Breast cancer Mother        25's  . Heart disease Father   . Stroke Father   . Hypertension Father   . Diabetes Father   . Colon cancer Other        paternal cousin     Prior to Admission medications   Medication Sig Start Date End Date Taking? Authorizing Provider  budesonide (PULMICORT) 0.5 MG/2ML nebulizer solution Take 2 mLs by nebulization 2 (two) times daily. 03/25/19  Yes [provider]  citalopram (CELEXA) 20 MG tablet Take 1 tablet (20 mg total) by mouth daily. 03/26/19  Yes Einar Pheasant, MD  losartan (COZAAR) 50 MG tablet TAKE 1 TABLET BY MOUTH ONCE A DAY 03/26/19  Yes Einar Pheasant, MD  rosuvastatin (CRESTOR) 5 MG tablet Take 1 tablet (5 mg total) by mouth daily. 03/26/19  Yes Einar Pheasant, MD  warfarin (COUMADIN) 5 MG tablet Take 5 mg on Wednesday and Sunday, 2.5 mg all other days. Patient taking  differently: Take 2.5-5 mg by mouth daily in the afternoon. 5MG -SUNDAY AND WEDNESDAY 2.5MG -MONDAY,TUESDAY,THURSDAY,FRIDAY, AND SUNDAY 03/26/19  Yes Einar Pheasant, MD  acetaminophen (TYLENOL) 650 MG CR tablet Take 650 mg by mouth every 8 (eight) hours as needed for pain.    [provider]  albuterol (ACCUNEB) 0.63 MG/3ML nebulizer solution Take 3 mLs (0.63 mg total) by nebulization every 6 (six) hours as needed for wheezing. 03/20/18   Flora Lipps, MD  PROAIR HFA 108 [phone] Base) MCG/ACT inhaler Inhale 2 puffs into the lungs every 6 (six) hours as needed for wheezing.  01/31/18   [provider]    Physical Exam: Vitals:   04/19/19 0800 04/19/19 0930 04/19/19 1000 04/19/19 1030  BP: (!) 181/84 (!) 178/95 (!) 165/70 (!) 161/80  Pulse: 68 71 69 76  Resp: 12     Temp:      TempSrc:      SpO2: 96% 98% 97% 95%  Weight:      Height:         Vitals:   04/19/19 0800 04/19/19 0930 04/19/19 1000 04/19/19 1030  BP: (!) 181/84 (!) 178/95 (!) 165/70 (!) 161/80  Pulse: 68 71 69 76  Resp: 12     Temp:      TempSrc:      SpO2: 96% 98% 97% 95%  Weight:      Height:        Constitutional: NAD, alert and oriented x  Eyes: PERRL, lids and conjunctivae normal ENMT: Mucous membranes are moist.  Neck: normal, supple, no masses, no thyromegaly Respiratory: clear to auscultation bilaterally, no wheezing, no crackles. Normal respiratory effort. No accessory muscle use.  Cardiovascular: Regular rate and rhythm, no murmurs / rubs / gallops. No extremity edema. 2+ pedal pulses. No carotid bruits.  Abdomen: no tenderness, no masses palpated. No hepatosplenomegaly. Bowel sounds positive.  Musculoskeletal: no clubbing / cyanosis. No joint deformity upper and lower extremities.  Skin: no rashes, lesions, ulcers.  Neurologic: No gross focal neurologic deficit. Psychiatric: Normal mood and affect.   Labs on Admission: I have personally reviewed following labs and imaging  studies  CBC: Recent Labs  Lab 04/19/19 0049 04/19/19 1032  WBC 8.7 12.6*  HGB 13.7 13.3  HCT 42.7 41.5  MCV 87.9 89.1  PLT 285 [id number]   Basic Metabolic Panel: Recent Labs  Lab 04/19/19 0049  NA 139  K 3.2*  CL 101  CO2 26  GLUCOSE 137*  BUN 7*  CREATININE 0.88  CALCIUM 8.8*   GFR: Estimated Creatinine Clearance: 67.8 mL/min (by C-G formula based on SCr of 0.88 mg/dL). Liver Function Tests: Recent Labs  Lab 04/19/19 0049  AST 19  ALT 11  ALKPHOS 69  BILITOT 1.4*  PROT 7.5  ALBUMIN 4.2   Recent Labs  Lab 04/19/19 0049  LIPASE 23   No results for input(s): AMMONIA in the last 168 hours. Coagulation Profile: Recent Labs  Lab 04/19/19 0514  INR 4.8*   Cardiac Enzymes: No  results for input(s): CKTOTAL, CKMB, CKMBINDEX, TROPONINI in the last 168 hours. BNP (last 3 results) No results for input(s): PROBNP in the last 8760 hours. HbA1C: No results for input(s): HGBA1C in the last 72 hours. CBG: No results for input(s): GLUCAP in the last 168 hours. Lipid Profile: No results for input(s): CHOL, HDL, LDLCALC, TRIG, CHOLHDL, LDLDIRECT in the last 72 hours. Thyroid Function Tests: No results for input(s): TSH, T4TOTAL, FREET4, T3FREE, THYROIDAB in the last 72 hours. Anemia Panel: No results for input(s): VITAMINB12, FOLATE, FERRITIN, TIBC, IRON, RETICCTPCT in the last 72 hours. Urine analysis:    Component Value Date/Time   COLORURINE YELLOW (A) 04/19/2019 0049   APPEARANCEUR CLEAR (A) 04/19/2019 0049   APPEARANCEUR Hazy 02/23/2013 1407   LABSPEC 1.011 04/19/2019 0049   LABSPEC 1.010 02/23/2013 1407   PHURINE 7.0 04/19/2019 0049   GLUCOSEU NEGATIVE 04/19/2019 0049   GLUCOSEU Negative 02/23/2013 1407   GLUCOSEU NEGATIVE 02/10/2013 1032   HGBUR MODERATE (A) 04/19/2019 0049   BILIRUBINUR NEGATIVE 04/19/2019 0049   BILIRUBINUR neg 08/10/2014 1055   BILIRUBINUR Negative 02/23/2013 1407   KETONESUR 5 (A) 04/19/2019 0049   PROTEINUR NEGATIVE 04/19/2019 0049    UROBILINOGEN 0.2 08/10/2014 1055   UROBILINOGEN 0.2 02/10/2013 1032   NITRITE NEGATIVE 04/19/2019 0049   LEUKOCYTESUR TRACE (A) 04/19/2019 0049   LEUKOCYTESUR 3+ 02/23/2013 1407    Radiological Exams on Admission: DG Chest 2 View  Result Date: 04/19/2019 CLINICAL DATA:  Abdominal pain EXAM: CHEST - 2 VIEW COMPARISON:  01/31/2018 FINDINGS: The heart size and mediastinal contours are within normal limits. Both lungs are clear. The visualized skeletal structures are unremarkable. IMPRESSION: No active cardiopulmonary disease. Electronically Signed   By: Ulyses Jarred M.D.   On: 04/19/2019 05:46   CT Abdomen Pelvis W Contrast  Result Date: 04/19/2019 CLINICAL DATA:  Upper abdominal pain x3 days EXAM: CT ABDOMEN AND PELVIS WITH CONTRAST TECHNIQUE: Multidetector CT imaging of the abdomen and pelvis was performed using the standard protocol following bolus administration of intravenous contrast. CONTRAST:  170mL OMNIPAQUE IOHEXOL 300 MG/ML  SOLN COMPARISON:  Ultrasound from the same day, and previous FINDINGS: Lower chest: Minimal dependent atelectasis in the lung bases. No pleural or pericardial effusion. Hepatobiliary: Stable subcentimeter low-attenuation nonspecific lesion in hepatic segment 7 (Im16,Se2) . no biliary ductal dilatation. Gallbladder unremarkable. Pancreas: No mass or ductal dilatation. Pancreatic displacement by retroperitoneal collection. Spleen: Normal in size without focal abnormality. Adrenals/Urinary Tract: Adrenals unremarkable. Kidneys enhance normally, without mass or hydronephrosis. Urinary bladder incompletely distended Stomach/Bowel: Stomach is incompletely distended, unremarkable. Duodenum is nondistended, displaced anteriorly by retroperitoneal process. Remainder of small bowel is nondistended. Appendix not discretely identified The colon is nondilated, unremarkable. Vascular/Lymphatic: Aortoiliac Aortic Atherosclerosis (ICD10-170.0) without aneurysm. No abdominal or pelvic  adenopathy. Reproductive: Uterus and bilateral adnexa are unremarkable. Other: There is a 9.9 x 7.2 x 5.5 cm right retroperitoneal complex collection displacing the pancreatic head and distal duodenum anteriorly. There is a thickened somewhat nodular rim. Fluid level within the collection suggests blood, less likely enteric contents in the absence of any gas bubbles. Hyperdense foci at the interface persist on delayed scans, without any convincing evidence of active extravasation. There are surrounding retroperitoneal inflammatory/edematous changes. No ascites. No free air. Musculoskeletal: Small umbilical hernia containing only fat. Multilevel spondylitic changes in lower thoracic and lumbar spine. Bilateral hip DJD. No fracture or worrisome bone lesion. IMPRESSION: 1. 9.9 cm right retroperitoneal complex collection, with probable hematocrit level, without convincing evidence of active extravasation. Potential etiologies  include duodenal ulcer, pseudoaneurysm, pancreatic pseudocyst, or mass. 2. Small umbilical hernia containing only fat. Aortic Atherosclerosis (ICD10-I70.0). Electronically Signed   By: Lucrezia Europe M.D.   On: 04/19/2019 10:02   US ABDOMEN LIMITED RUQ  Result Date: 04/19/2019 CLINICAL DATA:  Upper abdominal pain for 3 days. EXAM: ULTRASOUND ABDOMEN LIMITED RIGHT UPPER QUADRANT COMPARISON:  None. FINDINGS: Gallbladder: The gallbladder is distended without wall thickening, pericholecystic fluid, stone, sludge or Murphy's sign. Common bile duct: Diameter: 3.6 mm Liver: No focal lesion identified. Within normal limits in parenchymal echogenicity. Portal vein is patent on color Doppler imaging with normal direction of blood flow towards the liver. Other: There is a 9.3 x 7.2 x 5.2 cm rounded masslike collection with a fluid fluid level inferior to the liver and gallbladder. This does not definitely arise from the aorta or the right kidney. IMPRESSION: 1. There is a 9.3 x 7.2 x 5.2 cm rounded masslike  collection with a fluid fluid level inferior to the liver and gallbladder. This masslike collection does not definitely arise from the aorta or right kidney. No internal blood flow. No correlate for this masslike collection was seen on the CT scan from January 2015. Recommend a CT scan of the abdomen and pelvis with intravenous and oral contrast. 2. The gallbladder is distended but otherwise unremarkable. No stones, wall thickening, pericholecystic fluid, or Murphy's sign. Electronically Signed   By: Dorise Bullion III M.D   On: 04/19/2019 06:37    EKG: Independently reviewed.  Normal sinus rhythm LVH  Assessment/Plan Principal Problem:   Nontraumatic retroperitoneal hematoma Active Problems:   Essential hypertension, benign   Long term current use of anticoagulant therapy   Hypokalemia      Non traumatic retroperitoneal hematoma Patient presents for evaluation of abdominal pain mostly in the upper abdomen associated with anorexia. Imaging study was suggestive of a possible retroperitoneal hematoma Patient is on Coumadin with an INR of 4.8 Will hold Coumadin for now Monitor serial H&H transfuse if needed Reverse coagulopathy. Patient already received FFP and Vitamin K in the ER. Will consult vascular surgery Monitor PT/INR   Hypertension Blood pressure is uncontrolled   Hypokalemia Supplement potassium   History of factor V Leiden Patient on chronic anticoagulation therapy with Coumadin INR is supra therapeutic Hold Coumadin for now  DVT prophylaxis: Patient is on Coumadin with supra therapeutic INR Code Status:  Family Communication:  Disposition Plan: Patient transferred to [facility] Consults called: Vascular Surgery    Oswaldo Cueto MD [facility]     04/19/2019, 11:45 AM

## 2019-04-19 NOTE — ED Provider Notes (Addendum)
CT reveals worsening of retroperitoneal hematoma with active extravasation.  I discussed with vascular surgery who has discussed with interventional radiology.  She needs a mesenteric angiogram.  He recommended transfer to [facility].  She will receive a second unit of FFP   CRITICAL CARE Performed by: Laurence Aly   Total critical care time: 30 minutes  Critical care time was exclusive of separately billable procedures and treating other patients.  Critical care was necessary to treat or prevent imminent or life-threatening deterioration.  Critical care was time spent personally by me on the following activities: development of treatment plan with patient and/or surrogate as well as nursing, discussions with consultants, evaluation of patient's response to treatment, examination of patient, obtaining history from patient or surrogate, ordering and performing treatments and interventions, ordering and review of laboratory studies, ordering and review of radiographic studies, pulse oximetry and re-evaluation of patient's condition.  Earleen Newport, MD 04/19/19 1314    Earleen Newport, MD 04/19/19 [phone]

## 2019-04-19 NOTE — ED Triage Notes (Signed)
Pt reports upper abdominal pain for 3 days, denies any other symptoms, Pt reports pain is constant and achy feeling denies any injuries to area. Pt talks in complete sentence no respiratory distress noted.

## 2019-04-20 LAB — BPAM FFP
Blood Product Expiration Date: 202103252359
Blood Product Expiration Date: 202103252359
ISSUE DATE / TIME: 202103201224
ISSUE DATE / TIME: 202103201435
Unit Type and Rh: 6200
Unit Type and Rh: 6200

## 2019-04-20 LAB — COMPREHENSIVE METABOLIC PANEL
ALT: 13 U/L (ref 0–44)
AST: 21 U/L (ref 15–41)
Albumin: 3.5 g/dL (ref 3.5–5.0)
Alkaline Phosphatase: 53 U/L (ref 38–126)
Anion gap: 14 (ref 5–15)
BUN: 8 mg/dL (ref 8–23)
CO2: 27 mmol/L (ref 22–32)
Calcium: 8.6 mg/dL — ABNORMAL LOW (ref 8.9–10.3)
Chloride: 96 mmol/L — ABNORMAL LOW (ref 98–111)
Creatinine, Ser: 0.89 mg/dL (ref 0.44–1.00)
GFR calc Af Amer: >60 mL/min (ref >60)
GFR calc non Af Amer: >60 mL/min (ref >60)
Glucose, Bld: 126 mg/dL — ABNORMAL HIGH (ref 70–99)
Potassium: 3.5 mmol/L (ref 3.5–5.1)
Sodium: 137 mmol/L (ref 135–145)
Total Bilirubin: 2.1 mg/dL — ABNORMAL HIGH (ref 0.3–1.2)
Total Protein: 6.7 g/dL (ref 6.5–8.1)

## 2019-04-20 LAB — CBC
HCT: 33.8 % — ABNORMAL LOW (ref 36.0–46.0)
Hemoglobin: 11.3 g/dL — ABNORMAL LOW (ref 12.0–15.0)
MCH: 29.2 pg (ref 26.0–34.0)
MCHC: 33.4 g/dL (ref 30.0–36.0)
MCV: 87.3 fL (ref 80.0–100.0)
Platelets: 258 10*3/uL (ref 150–400)
RBC: 3.87 MIL/uL (ref 3.87–5.11)
RDW: 13.1 % (ref 11.5–15.5)
WBC: 12.5 10*3/uL — ABNORMAL HIGH (ref 4.0–10.5)
nRBC: 0 % (ref 0.0–0.2)

## 2019-04-20 LAB — HEMOGLOBIN AND HEMATOCRIT, BLOOD
HCT: 32.9 % — ABNORMAL LOW (ref 36.0–46.0)
HCT: 33.8 % — ABNORMAL LOW (ref 36.0–46.0)
HCT: 31.2 % — ABNORMAL LOW (ref 36.0–46.0)
Hemoglobin: 10.5 g/dL — ABNORMAL LOW (ref 12.0–15.0)
Hemoglobin: 10.8 g/dL — ABNORMAL LOW (ref 12.0–15.0)
Hemoglobin: 9.9 g/dL — ABNORMAL LOW (ref 12.0–15.0)

## 2019-04-20 LAB — PREPARE FRESH FROZEN PLASMA
Unit division: 0
Unit division: 0

## 2019-04-20 LAB — AMYLASE: Amylase: 34 U/L (ref 28–100)

## 2019-04-20 LAB — PROTIME-INR
INR: 1.2 (ref 0.8–1.2)
Prothrombin Time: 15.4 seconds — ABNORMAL HIGH (ref 11.4–15.2)

## 2019-04-20 LAB — LIPASE, BLOOD: Lipase: 21 U/L (ref 11–51)

## 2019-04-20 MED ORDER — MORPHINE SULFATE (PF) 2 MG/ML IV SOLN
2.0000 mg | INTRAVENOUS | Status: AC | PRN
  Administered 2019-04-20 (×2): 2 mg via INTRAVENOUS
  Filled 2019-04-20 (×2): qty 1

## 2019-04-20 MED ORDER — PROMETHAZINE HCL 25 MG/ML IJ SOLN
12.5000 mg | Freq: Four times a day (QID) | INTRAMUSCULAR | Status: DC | PRN
  Administered 2019-04-20: 12.5 mg via INTRAVENOUS
  Filled 2019-04-20: qty 1

## 2019-04-20 MED ORDER — MAGNESIUM SULFATE 2 GM/50ML IV SOLN
2.0000 g | Freq: Once | INTRAVENOUS | Status: AC
  Administered 2019-04-20: 2 g via INTRAVENOUS
  Filled 2019-04-20: qty 50

## 2019-04-20 MED ORDER — PROMETHAZINE HCL 25 MG/ML IJ SOLN
6.2500 mg | Freq: Four times a day (QID) | INTRAMUSCULAR | Status: DC | PRN
  Administered 2019-04-21: 6.25 mg via INTRAVENOUS
  Filled 2019-04-20: qty 1

## 2019-04-20 MED ORDER — BUDESONIDE 0.5 MG/2ML IN SUSP
2.0000 mL | Freq: Two times a day (BID) | RESPIRATORY_TRACT | Status: DC
  Administered 2019-04-20 – 2019-04-27 (×13): 0.5 mg via RESPIRATORY_TRACT
  Filled 2019-04-20 (×15): qty 2

## 2019-04-20 MED ORDER — SODIUM CHLORIDE 0.9 % IV SOLN: INTRAVENOUS | Status: DC

## 2019-04-20 MED ORDER — MORPHINE SULFATE (PF) 2 MG/ML IV SOLN
2.0000 mg | INTRAVENOUS | Status: AC | PRN
  Administered 2019-04-20 – 2019-04-22 (×8): 2 mg via INTRAVENOUS
  Filled 2019-04-20 (×10): qty 1

## 2019-04-20 NOTE — Progress Notes (Signed)
Marland Kitchen  PROGRESS NOTE    Tina Duarte  [id number] DOB: 03/23/1945 DOA: 04/19/2019 PCP: Einar Pheasant, MD   Brief Narrative:   Tina Duarte is a 74 y.o. female with medical history significant of Factor V Leiden, HTN, chronic respiratory failure on 1L [place] at home. Was seen at [facility] for abdominal pain. She is unable to tell me the full story as she is intermittently confused. However, chart review shows that she had about 4 days of abdominal pain that was achy and constant. She's had poor appetite during this time. She denied any bowel habit changes at the time. She denies any urinary changes at the time. She denies any other alleviating or aggravating factors. She was evaluated by their ED and found to have a retroperitoneal hematoma. She was also found to have an INR of 4.8. A CTA was obtained that revealed the possibility of extravasation. She was evaluated by vascular surgery who suggested coagulation reversal and transfer to [facility] for possible IR intervention w/ angiography and embolization.   3/21: Hgb slowly dropping. INR is improved. Will reassess hematoma with CT in the AM.    Assessment & Plan:   Principal Problem:   Retroperitoneal hematoma Active Problems:   Essential hypertension, benign   Hypokalemia   Abdominal pain   Supratherapeutic INR   Factor V Leiden (HCC)  Retroperitoneal hematoma Abdominal pain Supratherapeutic INR     - s/p 2 units FFP and vit K at [place]; repeat INR now and in AM     - if INR does not stabilize and she becomes unstable, will need to get IR involved and get mesenteric angiography and embolization; spoke with IR (Dr. Laurence Ferrari), they are aware of the patient     - admit to inpt, med-tele; expect greater than 2MN stay     - trend H&H, transfuse as necessary     - 3/21: INR is 1.2; Hgb is down to 10.5; ab pain improved with morphine, repeat CT in AM to assess hematoma size  N/V     - zofran, phenergan, fluids     - NPO for  now  Hypokalemia Hypomagnesemia     - repeat labs, replace as necessary     - check Mg2+     - 3/21: replace Mg2+, K+ is ok, monitor  Factor V Leiden     - normally on coumadin     - she is supratherapeutic; hold for now     - will consult pharmacy for dosing when stable  HTN     - N/V right now; so can't take home meds     - will add PRN hydralazine for SBP >170     - 3/21: BP acceptable, monitor  DVT prophylaxis: SCDs Code Status: FULL Family Communication: Spoke with dtr Jolyn Nap [phone]) by phone with update to plan.   Disposition Plan: Reassess hematoma size in the AM, follow up with IR  ROS:  Reports N, ab pain . Remainder 10-pt ROS is negative for all not previously mentioned.  Subjective: "It hurts there."  Objective: Vitals:   04/20/19 0939 04/20/19 1000 04/20/19 1423 04/20/19 1425  BP: (!) 116/96 128/61  (!) 149/71  Pulse: 97 97 86 80  Resp: 15 20 19 18   Temp:    99.3 F (37.4 C)  TempSrc:    Oral  SpO2: 99% 98% 99% 98%  Weight:      Height:        Intake/Output Summary (Last 24  hours) at 04/20/2019 1539 Last data filed at 04/20/2019 1000 Gross per 24 hour  Intake 499.63 ml  Output 590 ml  Net -90.37 ml   Filed Weights   04/19/19 1727  Weight: 108.9 kg    Examination:  General: 74 y.o. female resting in bed in NAD Cardiovascular: RRR, +S1, S2, no m/g/r Respiratory: CTABL, no w/r/r, normal WOB GI: BS+, obese, RUQ TTP, no masses noted, no organomegaly noted MSK: No e/c/c Neuro: Alert to name and following commands Psyc: Appropriate interaction and affect, calm/cooperative, able to keep up with conversation better today.   Data Reviewed: I have personally reviewed following labs and imaging studies.  CBC: Recent Labs  Lab 04/19/19 0049 04/19/19 0049 04/19/19 1032 04/19/19 1846 04/19/19 2205 04/20/19 0433 04/20/19 1037  WBC 8.7  --  12.6* 12.3*  --  12.5*  --   HGB 13.7   < > 13.3 12.1 11.5* 11.3* 10.5*  HCT 42.7   < >  41.5 35.9* 35.6* 33.8* 32.9*  MCV 87.9  --  89.1 86.1  --  87.3  --   PLT 285  --  270 305  --  258  --    < > = values in this interval not displayed.   Basic Metabolic Panel: Recent Labs  Lab 04/19/19 0049 04/19/19 1846 04/20/19 0433  NA 139 136 137  K 3.2* 3.7 3.5  CL 101 93* 96*  CO2 26 27 27   GLUCOSE 137* 150* 126*  BUN 7* 7* 8  CREATININE 0.88 0.95 0.89  CALCIUM 8.8* 8.8* 8.6*  MG  --  1.4*  --    GFR: Estimated Creatinine Clearance: 67.1 mL/min (by C-G formula based on SCr of 0.89 mg/dL). Liver Function Tests: Recent Labs  Lab 04/19/19 0049 04/19/19 1846 04/20/19 0433  AST 19 29 21   ALT 11 18 13   ALKPHOS 69 63 53  BILITOT 1.4* 2.8* 2.1*  PROT 7.5 7.1 6.7  ALBUMIN 4.2 4.0 3.5   Recent Labs  Lab 04/19/19 0049 04/20/19 1037  LIPASE 23 21  AMYLASE  --  34   No results for input(s): AMMONIA in the last 168 hours. Coagulation Profile: Recent Labs  Lab 04/19/19 0514 04/19/19 1846 04/20/19 0433  INR 4.8* 1.2 1.2   Cardiac Enzymes: No results for input(s): CKTOTAL, CKMB, CKMBINDEX, TROPONINI in the last 168 hours. BNP (last 3 results) No results for input(s): PROBNP in the last 8760 hours. HbA1C: No results for input(s): HGBA1C in the last 72 hours. CBG: No results for input(s): GLUCAP in the last 168 hours. Lipid Profile: No results for input(s): CHOL, HDL, LDLCALC, TRIG, CHOLHDL, LDLDIRECT in the last 72 hours. Thyroid Function Tests: No results for input(s): TSH, T4TOTAL, FREET4, T3FREE, THYROIDAB in the last 72 hours. Anemia Panel: No results for input(s): VITAMINB12, FOLATE, FERRITIN, TIBC, IRON, RETICCTPCT in the last 72 hours. Sepsis Labs: No results for input(s): PROCALCITON, LATICACIDVEN in the last 168 hours.  Recent Results (from the past 240 hour(s))  Respiratory Panel by RT PCR (Flu A&B, Covid) - Nasopharyngeal Swab     Status: None   Collection Time: 04/19/19  7:29 AM   Specimen: Nasopharyngeal Swab  Result Value Ref Range Status    SARS Coronavirus 2 by RT PCR NEGATIVE NEGATIVE Final    Comment: (NOTE) SARS-CoV-2 target nucleic acids are NOT DETECTED. The SARS-CoV-2 RNA is generally detectable in upper respiratoy specimens during the acute phase of infection. The lowest concentration of SARS-CoV-2 viral copies this assay can detect is  131 copies/mL. A negative result does not preclude SARS-Cov-2 infection and should not be used as the sole basis for treatment or other patient management decisions. A negative result may occur with  improper specimen collection/handling, submission of specimen other than nasopharyngeal swab, presence of viral mutation(s) within the areas targeted by this assay, and inadequate number of viral copies (<131 copies/mL). A negative result must be combined with clinical observations, patient history, and epidemiological information. The expected result is Negative. Fact Sheet for Patients:  PinkCheek.be Fact Sheet for Healthcare Providers:  GravelBags.it This test is not yet ap proved or cleared by the [place] FDA and  has been authorized for detection and/or diagnosis of SARS-CoV-2 by FDA under an Emergency Use Authorization (EUA). This EUA will remain  in effect (meaning this test can be used) for the duration of the COVID-19 declaration under Section 564(b)(1) of the Act, 21 U.S.C. section 360bbb-3(b)(1), unless the authorization is terminated or revoked sooner.    Influenza A by PCR NEGATIVE NEGATIVE Final   Influenza B by PCR NEGATIVE NEGATIVE Final    Comment: (NOTE) The Xpert Xpress SARS-CoV-2/FLU/RSV assay is intended as an aid in  the diagnosis of influenza from Nasopharyngeal swab specimens and  should not be used as a sole basis for treatment. Nasal washings and  aspirates are unacceptable for Xpert Xpress SARS-CoV-2/FLU/RSV  testing. Fact Sheet for Patients: PinkCheek.be Fact  Sheet for Healthcare Providers: GravelBags.it This test is not yet approved or cleared by the [place] FDA and  has been authorized for detection and/or diagnosis of SARS-CoV-2 by  FDA under an Emergency Use Authorization (EUA). This EUA will remain  in effect (meaning this test can be used) for the duration of the  Covid-19 declaration under Section 564(b)(1) of the Act, 21  U.S.C. section 360bbb-3(b)(1), unless the authorization is  terminated or revoked. Performed at [facility], [address] [place], [place] [address]       Radiology Studies: DG Chest 2 View  Result Date: 04/19/2019 CLINICAL DATA:  Abdominal pain EXAM: CHEST - 2 VIEW COMPARISON:  01/31/2018 FINDINGS: The heart size and mediastinal contours are within normal limits. Both lungs are clear. The visualized skeletal structures are unremarkable. IMPRESSION: No active cardiopulmonary disease. Electronically Signed   By: Ulyses Jarred M.D.   On: 04/19/2019 05:46   CT Abdomen Pelvis W Contrast  Result Date: 04/19/2019 CLINICAL DATA:  Upper abdominal pain x3 days EXAM: CT ABDOMEN AND PELVIS WITH CONTRAST TECHNIQUE: Multidetector CT imaging of the abdomen and pelvis was performed using the standard protocol following bolus administration of intravenous contrast. CONTRAST:  143mL OMNIPAQUE IOHEXOL 300 MG/ML  SOLN COMPARISON:  Ultrasound from the same day, and previous FINDINGS: Lower chest: Minimal dependent atelectasis in the lung bases. No pleural or pericardial effusion. Hepatobiliary: Stable subcentimeter low-attenuation nonspecific lesion in hepatic segment 7 (Im16,Se2) . no biliary ductal dilatation. Gallbladder unremarkable. Pancreas: No mass or ductal dilatation. Pancreatic displacement by retroperitoneal collection. Spleen: Normal in size without focal abnormality. Adrenals/Urinary Tract: Adrenals unremarkable. Kidneys enhance normally, without mass or hydronephrosis. Urinary  bladder incompletely distended Stomach/Bowel: Stomach is incompletely distended, unremarkable. Duodenum is nondistended, displaced anteriorly by retroperitoneal process. Remainder of small bowel is nondistended. Appendix not discretely identified The colon is nondilated, unremarkable. Vascular/Lymphatic: Aortoiliac Aortic Atherosclerosis (ICD10-170.0) without aneurysm. No abdominal or pelvic adenopathy. Reproductive: Uterus and bilateral adnexa are unremarkable. Other: There is a 9.9 x 7.2 x 5.5 cm right retroperitoneal complex collection displacing the pancreatic head and distal  duodenum anteriorly. There is a thickened somewhat nodular rim. Fluid level within the collection suggests blood, less likely enteric contents in the absence of any gas bubbles. Hyperdense foci at the interface persist on delayed scans, without any convincing evidence of active extravasation. There are surrounding retroperitoneal inflammatory/edematous changes. No ascites. No free air. Musculoskeletal: Small umbilical hernia containing only fat. Multilevel spondylitic changes in lower thoracic and lumbar spine. Bilateral hip DJD. No fracture or worrisome bone lesion. IMPRESSION: 1. 9.9 cm right retroperitoneal complex collection, with probable hematocrit level, without convincing evidence of active extravasation. Potential etiologies include duodenal ulcer, pseudoaneurysm, pancreatic pseudocyst, or mass. 2. Small umbilical hernia containing only fat. Aortic Atherosclerosis (ICD10-I70.0). Electronically Signed   By: Lucrezia Europe M.D.   On: 04/19/2019 10:02   CT Angio Abd/Pel W and/or Wo Contrast  Result Date: 04/19/2019 CLINICAL DATA:  Retroperitoneal hematoma, rule out pseudoaneurysm EXAM: CTA ABDOMEN AND PELVIS WITHOUT AND WITH CONTRAST TECHNIQUE: Multidetector CT imaging of the abdomen and pelvis was performed using the standard protocol during bolus administration of intravenous contrast. Multiplanar reconstructed images and MIPs were  obtained and reviewed to evaluate the vascular anatomy. CONTRAST:  176mL OMNIPAQUE IOHEXOL 350 MG/ML SOLN COMPARISON:  Earlier CT of the same day FINDINGS: VASCULAR Aorta: Moderate calcified atheromatous plaque. No dissection, aneurysm, or stenosis. Celiac: Patent without evidence of aneurysm, dissection, vasculitis or significant stenosis. SMA: Patent with classic distal branch anatomy. No evidence of aneurysm, dissection, vasculitis or significant stenosis. Renals: Single left, patent Single right, patent IMA: Patent without evidence of aneurysm, dissection, vasculitis or significant stenosis. Inflow: Mild scattered atheromatous plaque. No aneurysm, dissection, or stenosis. Proximal Outflow: Bilateral common femoral and visualized portions of the superficial and profunda femoral arteries are patent without evidence of aneurysm, dissection, vasculitis or significant stenosis. Veins: Patent hepatic veins, portal vein, SMV, splenic vein, bilateral renal veins, IVC, iliac venous system. No venous pathology evident. Review of the MIP images confirms the above findings. NON-VASCULAR Lower chest: No pleural or pericardial effusion. Linear subpleural opacities posteriorly in the lung bases. Hepatobiliary: No focal liver abnormality is seen. No gallstones, gallbladder wall thickening, or biliary dilatation. Pancreas: No mass or ductal dilatation. Spleen: Normal in size without focal abnormality. Adrenals/Urinary Tract: Adrenal glands unremarkable 1.1 cm probable cyst, mid pole left kidney. No hydronephrosis. Urinary bladder physiologically distended. Stomach/Bowel: Stomach is incompletely distended with oral contrast material. The duodenum is nondilated, displaced anteriorly by retroperitoneal hematoma. Distal small bowel decompressed. Colon is nondilated, unremarkable. Lymphatic: No abdominal or pelvic adenopathy. Reproductive: Uterus and bilateral adnexa are unremarkable. Other: 10x8 x 6.5cm enlarging right  retroperitoneal hematoma posterior to the duodenum and pancreatic head. There is no definite associated pseudoaneurysm although progressive contrast accumulation is evident in the collection on the delayed scan consistent with continued active extravasation, possibly from pancreaticoduodenal arterial branch. Persistent surrounding retroperitoneal inflammatory/edematous changes. No free air.  No ascites. Musculoskeletal: Small umbilical hernia containing mesenteric fat. Multilevel lumbar spondylitic changes. Bilateral hip DJD. No fracture or worrisome bone lesion. IMPRESSION: 1. Active extravasation into right retroperitoneal hematoma, which has slightly enlarged since earlier scan. No definite pseudoaneurysm or etiology of bleeding identified. Critical Value/emergent results were discussed by telephone at the time of interpretation on 04/19/2019 at 12:59 pm to with Dr. Trula Slade, who verbally acknowledged these results. Electronically Signed   By: Lucrezia Europe M.D.   On: 04/19/2019 13:00   US ABDOMEN LIMITED RUQ  Result Date: 04/19/2019 CLINICAL DATA:  Upper abdominal pain for 3 days. EXAM: ULTRASOUND ABDOMEN LIMITED RIGHT  UPPER QUADRANT COMPARISON:  None. FINDINGS: Gallbladder: The gallbladder is distended without wall thickening, pericholecystic fluid, stone, sludge or Murphy's sign. Common bile duct: Diameter: 3.6 mm Liver: No focal lesion identified. Within normal limits in parenchymal echogenicity. Portal vein is patent on color Doppler imaging with normal direction of blood flow towards the liver. Other: There is a 9.3 x 7.2 x 5.2 cm rounded masslike collection with a fluid fluid level inferior to the liver and gallbladder. This does not definitely arise from the aorta or the right kidney. IMPRESSION: 1. There is a 9.3 x 7.2 x 5.2 cm rounded masslike collection with a fluid fluid level inferior to the liver and gallbladder. This masslike collection does not definitely arise from the aorta or right kidney. No  internal blood flow. No correlate for this masslike collection was seen on the CT scan from January 2015. Recommend a CT scan of the abdomen and pelvis with intravenous and oral contrast. 2. The gallbladder is distended but otherwise unremarkable. No stones, wall thickening, pericholecystic fluid, or Murphy's sign. Electronically Signed   By: Dorise Bullion III M.D   On: 04/19/2019 06:37     Scheduled Meds: . budesonide  2 mL Nebulization BID  . sodium chloride flush  3 mL Intravenous Q12H   Continuous Infusions: . sodium chloride 75 mL/hr at 04/20/19 0953     LOS: 1 day    Time spent: 35 minutes spent in the coordination of care today.    Jonnie Finner, DO [facility]  If 7PM-7AM, please contact night-coverage [email or web address] 04/20/2019, 3:39 PM

## 2019-04-20 NOTE — Progress Notes (Signed)
Patient is very weak and nauseous. Was able to tolerate nebulizer and medication.

## 2019-04-21 LAB — COMPREHENSIVE METABOLIC PANEL
ALT: 13 U/L (ref 0–44)
AST: 21 U/L (ref 15–41)
Albumin: 3.1 g/dL — ABNORMAL LOW (ref 3.5–5.0)
Alkaline Phosphatase: 47 U/L (ref 38–126)
Anion gap: 12 (ref 5–15)
BUN: 9 mg/dL (ref 8–23)
CO2: 27 mmol/L (ref 22–32)
Calcium: 8.2 mg/dL — ABNORMAL LOW (ref 8.9–10.3)
Chloride: 100 mmol/L (ref 98–111)
Creatinine, Ser: 0.82 mg/dL (ref 0.44–1.00)
GFR calc Af Amer: >60 mL/min (ref >60)
GFR calc non Af Amer: >60 mL/min (ref >60)
Glucose, Bld: 97 mg/dL (ref 70–99)
Potassium: 3.1 mmol/L — ABNORMAL LOW (ref 3.5–5.1)
Sodium: 139 mmol/L (ref 135–145)
Total Bilirubin: 2 mg/dL — ABNORMAL HIGH (ref 0.3–1.2)
Total Protein: 5.9 g/dL — ABNORMAL LOW (ref 6.5–8.1)

## 2019-04-21 LAB — CBC WITH DIFFERENTIAL/PLATELET
Abs Immature Granulocytes: 0.03 10*3/uL (ref 0.00–0.07)
Basophils Absolute: 0 10*3/uL (ref 0.0–0.1)
Basophils Relative: 1 %
Eosinophils Absolute: 0.2 10*3/uL (ref 0.0–0.5)
Eosinophils Relative: 3 %
HCT: 31.7 % — ABNORMAL LOW (ref 36.0–46.0)
Hemoglobin: 10 g/dL — ABNORMAL LOW (ref 12.0–15.0)
Immature Granulocytes: 0 %
Lymphocytes Relative: 20 %
Lymphs Abs: 1.5 10*3/uL (ref 0.7–4.0)
MCH: 28.2 pg (ref 26.0–34.0)
MCHC: 31.5 g/dL (ref 30.0–36.0)
MCV: 89.5 fL (ref 80.0–100.0)
Monocytes Absolute: 0.7 10*3/uL (ref 0.1–1.0)
Monocytes Relative: 10 %
Neutro Abs: 4.8 10*3/uL (ref 1.7–7.7)
Neutrophils Relative %: 66 %
Platelets: 199 10*3/uL (ref 150–400)
RBC: 3.54 MIL/uL — ABNORMAL LOW (ref 3.87–5.11)
RDW: 12.8 % (ref 11.5–15.5)
WBC: 7.3 10*3/uL (ref 4.0–10.5)
nRBC: 0 % (ref 0.0–0.2)

## 2019-04-21 LAB — PROTIME-INR
INR: 1.2 (ref 0.8–1.2)
Prothrombin Time: 15.2 seconds (ref 11.4–15.2)

## 2019-04-21 LAB — HEMOGLOBIN AND HEMATOCRIT, BLOOD
HCT: 31.2 % — ABNORMAL LOW (ref 36.0–46.0)
HCT: 31.8 % — ABNORMAL LOW (ref 36.0–46.0)
HCT: 31.2 % — ABNORMAL LOW (ref 36.0–46.0)
Hemoglobin: 9.9 g/dL — ABNORMAL LOW (ref 12.0–15.0)
Hemoglobin: 10 g/dL — ABNORMAL LOW (ref 12.0–15.0)
Hemoglobin: 10 g/dL — ABNORMAL LOW (ref 12.0–15.0)

## 2019-04-21 LAB — CK: Total CK: 473 U/L — ABNORMAL HIGH (ref 38–234)

## 2019-04-21 LAB — MAGNESIUM: Magnesium: 2 mg/dL (ref 1.7–2.4)

## 2019-04-21 MED ORDER — POTASSIUM CHLORIDE 10 MEQ/100ML IV SOLN
10.0000 meq | INTRAVENOUS | Status: AC
  Administered 2019-04-21 (×5): 10 meq via INTRAVENOUS
  Filled 2019-04-21 (×5): qty 100

## 2019-04-21 NOTE — Progress Notes (Signed)
Marland Kitchen  PROGRESS NOTE    Tina Duarte  [id number] DOB: 18-Jan-1946 DOA: 04/19/2019 PCP: Einar Pheasant, MD   Brief Narrative:   Tina Duarte a 74 y.o.femalewith medical history significant ofFactor V Leiden, HTN, chronic respiratory failure on 1L [place] at home. Was seen at [facility] for abdominal pain. She is unable to tell me the full story as she is intermittently confused. However, chart review shows that she had about 4 days of abdominal pain that was achy and constant. She's had poor appetite during this time. She denied any bowel habit changes at the time. She denies any urinary changes at the time. She denies any other alleviating or aggravating factors. She was evaluated by their ED and found to have a retroperitoneal hematoma. She was also found to have an INR of 4.8. A CTA was obtained that revealed the possibility of extravasation. She was evaluated by vascular surgery who suggested coagulation reversal and transfer to [facility] for possible IR intervention w/ angiography and embolization.  3/22: Hgb holding. INR stable. Reviewed with IR about rpt imaging to follow up. Would wait on imaging unless she has a sudden decline in clinical status. Also would not resume coumadin just yet. 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 However, the risks v benefits are shifted in the risks column. Ab pain is likely secondary to hematoma.   Assessment & Plan:   Principal Problem:   Retroperitoneal hematoma Active Problems:   Essential hypertension, benign   Hypokalemia   Abdominal pain   Supratherapeutic INR   Factor V Leiden (HCC)  Retroperitoneal hematoma Abdominal pain Supratherapeutic INR - s/p 2 units FFP and vit K at [place]; repeat INR now and in AM - if INR does not stabilize and she becomes unstable, will need to get IR involved and get mesenteric angiography and embolization; spoke with IR (Dr. Laurence Ferrari), they are aware of the patient - admit to inpt, med-tele; expect greater  than 2MN stay - trend H&H, transfuse as necessary     - 3/21: INR is 1.2; Hgb is down to 10.5; ab pain improved with morphine     - 3/22: spoke with IR about rpt imaging, hold for now; Hgb and INR are stable; she is improving. Start diet.    N/V - zofran, phenergan, fluids - NPO for now  Hypokalemia Hypomagnesemia - repeat labs, replace as necessary - check Mg2+     - 3/21: replace Mg2+, K+ is ok, monitor     - 3/22: replace K+ this AM  Factor V Leiden - normally on coumadin - she is supratherapeutic; hold for now     - 3/22: hold coumadin for now as the risks of restarted bleed into hematoma are too high  HTN - N/V right now; so can't take home meds - will add PRN hydralazine for SBP >170     - 3/21: BP acceptable, monitor  DVT prophylaxis: SCDs Code Status: FULL Family Communication: Spoke with dtr Jolyn Nap [phone]) by phone with plan update.   Disposition Plan: Resume diet. Follow hgb/clinical status. If stable, likely home in next 48 - 72 hours.   ROS:  C/o epigastric/LUQ ab pain . Remainder 10-pt ROS is negative for all not previously mentioned.  Subjective: "I've been having back pain."  Objective: Vitals:   04/21/19 0600 04/21/19 0755 04/21/19 0903 04/21/19 1233  BP: 138/62  125/76 134/69  Pulse: 74  75 73  Resp: 15  16 18   Temp:  98.5 F (36.9 C) 97.9 F (36.6 C)  TempSrc:   Oral Oral  SpO2: 98% 100% 100% 97%  Weight:      Height:        Intake/Output Summary (Last 24 hours) at 04/21/2019 1251 Last data filed at 04/21/2019 1100 Gross per 24 hour  Intake 1389.18 ml  Output 300 ml  Net 1089.18 ml   Filed Weights   04/19/19 1727  Weight: 108.9 kg    Examination:  General: 74 y.o. female resting in bed in NAD Cardiovascular: RRR, +S1, S2, no m/g/r, equal pulses throughout Respiratory: CTABL, no w/r/r, normal WOB GI: BS+, obese, epigastric TTP, no masses noted, no organomegaly noted MSK: No  e/c/c Neuro: alert to name, follows commands Psyc: Appropriate interaction and affect, calm/cooperative   Data Reviewed: I have personally reviewed following labs and imaging studies.  CBC: Recent Labs  Lab 04/19/19 0049 04/19/19 0049 04/19/19 1032 04/19/19 1032 04/19/19 1846 04/19/19 2205 04/20/19 0433 04/20/19 0433 04/20/19 1037 04/20/19 1532 04/20/19 2139 04/21/19 0402 04/21/19 0919  WBC 8.7  --  12.6*  --  12.3*  --  12.5*  --   --   --   --  7.3  --   NEUTROABS  --   --   --   --   --   --   --   --   --   --   --  4.8  --   HGB 13.7   < > 13.3   < > 12.1   < > 11.3*   < > 10.5* 10.8* 9.9* 10.0* 9.9*  HCT 42.7   < > 41.5   < > 35.9*   < > 33.8*   < > 32.9* 33.8* 31.2* 31.7* 31.2*  MCV 87.9  --  89.1  --  86.1  --  87.3  --   --   --   --  89.5  --   PLT 285  --  270  --  305  --  258  --   --   --   --  199  --    < > = values in this interval not displayed.   Basic Metabolic Panel: Recent Labs  Lab 04/19/19 0049 04/19/19 1846 04/20/19 0433 04/21/19 0402  NA 139 136 137 139  K 3.2* 3.7 3.5 3.1*  CL 101 93* 96* 100  CO2 26 27 27 27   GLUCOSE 137* 150* 126* 97  BUN 7* 7* 8 9  CREATININE 0.88 0.95 0.89 0.82  CALCIUM 8.8* 8.8* 8.6* 8.2*  MG  --  1.4*  --  2.0   GFR: Estimated Creatinine Clearance: 72.8 mL/min (by C-G formula based on SCr of 0.82 mg/dL). Liver Function Tests: Recent Labs  Lab 04/19/19 0049 04/19/19 1846 04/20/19 0433 04/21/19 0402  AST 19 29 21 21   ALT 11 18 13 13   ALKPHOS 69 63 53 47  BILITOT 1.4* 2.8* 2.1* 2.0*  PROT 7.5 7.1 6.7 5.9*  ALBUMIN 4.2 4.0 3.5 3.1*   Recent Labs  Lab 04/19/19 0049 04/20/19 1037  LIPASE 23 21  AMYLASE  --  34   No results for input(s): AMMONIA in the last 168 hours. Coagulation Profile: Recent Labs  Lab 04/19/19 0514 04/19/19 1846 04/20/19 0433 04/21/19 0402  INR 4.8* 1.2 1.2 1.2   Cardiac Enzymes: No results for input(s): CKTOTAL, CKMB, CKMBINDEX, TROPONINI in the last 168 hours. BNP (last  3 results) No results for input(s): PROBNP in the last  8760 hours. HbA1C: No results for input(s): HGBA1C in the last 72 hours. CBG: No results for input(s): GLUCAP in the last 168 hours. Lipid Profile: No results for input(s): CHOL, HDL, LDLCALC, TRIG, CHOLHDL, LDLDIRECT in the last 72 hours. Thyroid Function Tests: No results for input(s): TSH, T4TOTAL, FREET4, T3FREE, THYROIDAB in the last 72 hours. Anemia Panel: No results for input(s): VITAMINB12, FOLATE, FERRITIN, TIBC, IRON, RETICCTPCT in the last 72 hours. Sepsis Labs: No results for input(s): PROCALCITON, LATICACIDVEN in the last 168 hours.  Recent Results (from the past 240 hour(s))  Respiratory Panel by RT PCR (Flu A&B, Covid) - Nasopharyngeal Swab     Status: None   Collection Time: 04/19/19  7:29 AM   Specimen: Nasopharyngeal Swab  Result Value Ref Range Status   SARS Coronavirus 2 by RT PCR NEGATIVE NEGATIVE Final    Comment: (NOTE) SARS-CoV-2 target nucleic acids are NOT DETECTED. The SARS-CoV-2 RNA is generally detectable in upper respiratoy specimens during the acute phase of infection. The lowest concentration of SARS-CoV-2 viral copies this assay can detect is 131 copies/mL. A negative result does not preclude SARS-Cov-2 infection and should not be used as the sole basis for treatment or other patient management decisions. A negative result may occur with  improper specimen collection/handling, submission of specimen other than nasopharyngeal swab, presence of viral mutation(s) within the areas targeted by this assay, and inadequate number of viral copies (<131 copies/mL). A negative result must be combined with clinical observations, patient history, and epidemiological information. The expected result is Negative. Fact Sheet for Patients:  PinkCheek.be Fact Sheet for Healthcare Providers:  GravelBags.it This test is not yet ap proved or cleared by the  [place] FDA and  has been authorized for detection and/or diagnosis of SARS-CoV-2 by FDA under an Emergency Use Authorization (EUA). This EUA will remain  in effect (meaning this test can be used) for the duration of the COVID-19 declaration under Section 564(b)(1) of the Act, 21 U.S.C. section 360bbb-3(b)(1), unless the authorization is terminated or revoked sooner.    Influenza A by PCR NEGATIVE NEGATIVE Final   Influenza B by PCR NEGATIVE NEGATIVE Final    Comment: (NOTE) The Xpert Xpress SARS-CoV-2/FLU/RSV assay is intended as an aid in  the diagnosis of influenza from Nasopharyngeal swab specimens and  should not be used as a sole basis for treatment. Nasal washings and  aspirates are unacceptable for Xpert Xpress SARS-CoV-2/FLU/RSV  testing. Fact Sheet for Patients: PinkCheek.be Fact Sheet for Healthcare Providers: GravelBags.it This test is not yet approved or cleared by the [place] FDA and  has been authorized for detection and/or diagnosis of SARS-CoV-2 by  FDA under an Emergency Use Authorization (EUA). This EUA will remain  in effect (meaning this test can be used) for the duration of the  Covid-19 declaration under Section 564(b)(1) of the Act, 21  U.S.C. section 360bbb-3(b)(1), unless the authorization is  terminated or revoked. Performed at [facility], [address], [place], [place] [address]       Radiology Studies: No results found.   Scheduled Meds: . budesonide  2 mL Nebulization BID  . sodium chloride flush  3 mL Intravenous Q12H   Continuous Infusions: . sodium chloride 75 mL/hr at 04/20/19 2307     LOS: 2 days    Time spent: 25 minutes spent in the coordination of care today.    Jonnie Finner, DO [facility]  If 7PM-7AM, please contact night-coverage [email or web address] 04/21/2019, 12:51 PM

## 2019-04-22 ENCOUNTER — Encounter (INDEPENDENT_AMBULATORY_CARE_PROVIDER_SITE_OTHER): Payer: Medicare Other | Admitting: Vascular Surgery

## 2019-04-22 ENCOUNTER — Other Ambulatory Visit: Payer: Medicare Other

## 2019-04-22 LAB — CBC WITH DIFFERENTIAL/PLATELET
Abs Immature Granulocytes: 0.03 10*3/uL (ref 0.00–0.07)
Basophils Absolute: 0 10*3/uL (ref 0.0–0.1)
Basophils Relative: 1 %
Eosinophils Absolute: 0.3 10*3/uL (ref 0.0–0.5)
Eosinophils Relative: 4 %
HCT: 29 % — ABNORMAL LOW (ref 36.0–46.0)
Hemoglobin: 9.2 g/dL — ABNORMAL LOW (ref 12.0–15.0)
Immature Granulocytes: 0 %
Lymphocytes Relative: 26 %
Lymphs Abs: 1.8 10*3/uL (ref 0.7–4.0)
MCH: 28.8 pg (ref 26.0–34.0)
MCHC: 31.7 g/dL (ref 30.0–36.0)
MCV: 90.9 fL (ref 80.0–100.0)
Monocytes Absolute: 0.7 10*3/uL (ref 0.1–1.0)
Monocytes Relative: 10 %
Neutro Abs: 4 10*3/uL (ref 1.7–7.7)
Neutrophils Relative %: 59 %
Platelets: 195 10*3/uL (ref 150–400)
RBC: 3.19 MIL/uL — ABNORMAL LOW (ref 3.87–5.11)
RDW: 12.6 % (ref 11.5–15.5)
WBC: 6.8 10*3/uL (ref 4.0–10.5)
nRBC: 0 % (ref 0.0–0.2)

## 2019-04-22 LAB — PROTIME-INR
INR: 1.2 (ref 0.8–1.2)
Prothrombin Time: 14.8 seconds (ref 11.4–15.2)

## 2019-04-22 LAB — COMPREHENSIVE METABOLIC PANEL
ALT: 13 U/L (ref 0–44)
AST: 19 U/L (ref 15–41)
Albumin: 2.8 g/dL — ABNORMAL LOW (ref 3.5–5.0)
Alkaline Phosphatase: 41 U/L (ref 38–126)
Anion gap: 6 (ref 5–15)
BUN: 8 mg/dL (ref 8–23)
CO2: 29 mmol/L (ref 22–32)
Calcium: 7.9 mg/dL — ABNORMAL LOW (ref 8.9–10.3)
Chloride: 101 mmol/L (ref 98–111)
Creatinine, Ser: 0.81 mg/dL (ref 0.44–1.00)
GFR calc Af Amer: >60 mL/min (ref >60)
GFR calc non Af Amer: >60 mL/min (ref >60)
Glucose, Bld: 99 mg/dL (ref 70–99)
Potassium: 3.7 mmol/L (ref 3.5–5.1)
Sodium: 136 mmol/L (ref 135–145)
Total Bilirubin: 1.7 mg/dL — ABNORMAL HIGH (ref 0.3–1.2)
Total Protein: 5.7 g/dL — ABNORMAL LOW (ref 6.5–8.1)

## 2019-04-22 LAB — HEMOGLOBIN AND HEMATOCRIT, BLOOD
HCT: 31 % — ABNORMAL LOW (ref 36.0–46.0)
HCT: 30.3 % — ABNORMAL LOW (ref 36.0–46.0)
HCT: 34.4 % — ABNORMAL LOW (ref 36.0–46.0)
Hemoglobin: 9.7 g/dL — ABNORMAL LOW (ref 12.0–15.0)
Hemoglobin: 9.8 g/dL — ABNORMAL LOW (ref 12.0–15.0)
Hemoglobin: 10.8 g/dL — ABNORMAL LOW (ref 12.0–15.0)

## 2019-04-22 LAB — MAGNESIUM: Magnesium: 1.8 mg/dL (ref 1.7–2.4)

## 2019-04-22 MED ORDER — POLYETHYLENE GLYCOL 3350 17 G PO PACK: 17.0000 g | PACK | Freq: Every day | ORAL | Status: DC | PRN

## 2019-04-22 MED ORDER — OXYCODONE-ACETAMINOPHEN 7.5-325 MG PO TABS
1.0000 | ORAL_TABLET | Freq: Four times a day (QID) | ORAL | Status: DC | PRN
  Administered 2019-04-22 – 2019-04-26 (×9): 1 via ORAL
  Filled 2019-04-22 (×10): qty 1

## 2019-04-22 MED ORDER — DOCUSATE SODIUM 100 MG PO CAPS
100.0000 mg | ORAL_CAPSULE | Freq: Two times a day (BID) | ORAL | Status: DC
  Administered 2019-04-22 – 2019-04-24 (×4): 100 mg via ORAL
  Filled 2019-04-22 (×4): qty 1

## 2019-04-22 NOTE — Progress Notes (Addendum)
Marland Kitchen  PROGRESS NOTE    Lacy Hulsebus Sensabaugh  [id number] DOB: [birth year] DOA: 04/19/2019 PCP: Einar Pheasant, MD   Brief Narrative:   [name] a 74 y.o.femalewith medical history significant ofFactor V Leiden, HTN, chronic respiratory failure on 1L [place] at home. Was seen at [facility] for abdominal pain. She is unable to tell me the full story as she is intermittently confused. However, chart review shows that she had about 4 days of abdominal pain that was achy and constant. She's had poor appetite during this time. She denied any bowel habit changes at the time. She denies any urinary changes at the time. She denies any other alleviating or aggravating factors. She was evaluated by their ED and found to have a retroperitoneal hematoma. She was also found to have an INR of 4.8. A CTA was obtained that revealed the possibility of extravasation. She was evaluated by vascular surgery who suggested coagulation reversal and transfer to [facility] for possible IR intervention w/ angiography and embolization.  3/23: INR is ok today and Hgb is stable. Ab pain is still not controlled, but no new signs of bleed. CLD started, but pt slow to tolerate. Let's get PO intake improved. Let's get ab pain improved. As long as Hgb remains stable, then she would be good to discharge with outpt follow up on resolution/additional imaging of hematoma. As previously discussed, holding anticoagulation for now.   Assessment & Plan:   Principal Problem:   Retroperitoneal hematoma Active Problems:   Essential hypertension, benign   Hypokalemia   Abdominal pain   Supratherapeutic INR   Factor V Leiden (HCC)  Retroperitoneal hematoma Abdominal pain Supratherapeutic INR - s/p 2 units FFP and vit K at [place]; repeat INR now and in AM - if INR does not stabilize and she becomes unstable, will need to get IR involved and get mesenteric angiography and embolization; spoke with IR (Dr. Laurence Ferrari), they are aware of the  patient - admit to inpt, med-tele; expect greater than 2MN stay - trend H&H, transfuse as necessary - 3/21: INR is 1.2; Hgb is down to 10.5; ab pain improved with morphine     - 3/22: spoke with IR about rpt imaging, hold for now; Hgb and INR are stable; she is improving. Start diet.      - 3/23: still having ab pain. Have added more consistent PRNs and BM regimen; Hgb is stable   N/V - zofran,phenergan,fluids - NPO for now     - 3/23: improving, started CLD, slow to tolerate, advance as able  Hypokalemia Hypomagnesemia - repeat labs, replace as necessary - check Mg2+ - 3/21: replace Mg2+, K+ is ok, monitor     - 3/22: replace K+ this AM     - 3/23: resolved, monitor  Factor V Leiden - normally on coumadin - she is supratherapeutic; hold for now     - 3/22: hold coumadin for now as the risks of restarting bleed into hematoma are too high     - 3/23: INR is holding stable, risks of restarting bleed are high with resuming coumadin; consider consult to heme-onc prior to discharge for any further recommendations as she does have a history of LLE clots per dtr  HTN - N/V right now; so can't take home meds - will add PRN hydralazine for SBP >170 - 3/23: BP likely related to ab pain, has PRN hydralazine, monitor  DVT prophylaxis: SCDs Code Status: FULL   Disposition Plan: Needs ab pain control, needs to  tolerate diet; then can d/c to home  ROS:  C/o LUQ pain . Remainder 10-pt ROS is negative for all not previously mentioned.  Subjective: No acute events ON per nursing.  Objective: Vitals:   04/22/19 0734 04/22/19 0801 04/22/19 1205 04/22/19 1608  BP: (!) 146/67  (!) 167/81 (!) 145/70  Pulse: 75  80 76  Resp: 17  14 17   Temp: 98.6 F (37 C)  98 F (36.7 C) 98 F (36.7 C)  TempSrc: Oral  Oral Oral  SpO2: 97% 95% 95% 98%  Weight:      Height:        Intake/Output Summary (Last 24 hours) at 04/22/2019 1624 Last  data filed at 04/22/2019 1212 Gross per 24 hour  Intake 853.49 ml  Output 650 ml  Net 203.49 ml   Filed Weights   04/19/19 1727  Weight: 108.9 kg    Examination:  General: 74 y.o. female resting in bed in NAD Cardiovascular: RRR, +S1, S2, no m/g/r Respiratory: CTABL, no w/r/r, normal WOB GI: BS+, obese, LUQ tenderness, no masses noted, no organomegaly noted MSK: No e/c/c Neuro: Alert to name, follows commands Psyc: Appropriate interaction and affect, calm/cooperative   Data Reviewed: I have personally reviewed following labs and imaging studies.  CBC: Recent Labs  Lab 04/19/19 1032 04/19/19 1032 04/19/19 1846 04/19/19 2205 04/20/19 0433 04/20/19 1037 04/21/19 0402 04/21/19 0919 04/21/19 1554 04/21/19 2129 04/22/19 0311 04/22/19 1029 04/22/19 1526  WBC 12.6*  --  12.3*  --  12.5*  --  7.3  --   --   --  6.8  --   --   NEUTROABS  --   --   --   --   --   --  4.8  --   --   --  4.0  --   --   HGB 13.3   < > 12.1   < > 11.3*   < > 10.0*   < > 10.0* 10.0* 9.2* 9.7* 9.8*  HCT 41.5   < > 35.9*   < > 33.8*   < > 31.7*   < > 31.8* 31.2* 29.0* 31.0* 30.3*  MCV 89.1  --  86.1  --  87.3  --  89.5  --   --   --  90.9  --   --   PLT 270  --  305  --  258  --  199  --   --   --  195  --   --    < > = values in this interval not displayed.   Basic Metabolic Panel: Recent Labs  Lab 04/19/19 0049 04/19/19 1846 04/20/19 0433 04/21/19 0402 04/22/19 0311  NA 139 136 137 139 136  K 3.2* 3.7 3.5 3.1* 3.7  CL 101 93* 96* 100 101  CO2 26 27 27 27 29   GLUCOSE 137* 150* 126* 97 99  BUN 7* 7* 8 9 8   CREATININE 0.88 0.95 0.89 0.82 0.81  CALCIUM 8.8* 8.8* 8.6* 8.2* 7.9*  MG  --  1.4*  --  2.0 1.8   GFR: Estimated Creatinine Clearance: 73.7 mL/min (by C-G formula based on SCr of 0.81 mg/dL). Liver Function Tests: Recent Labs  Lab 04/19/19 0049 04/19/19 1846 04/20/19 0433 04/21/19 0402 04/22/19 0311  AST 19 29 21 21 19   ALT 11 18 13 13 13   ALKPHOS 69 63 53 47 41  BILITOT  1.4* 2.8* 2.1* 2.0* 1.7*  PROT 7.5 7.1 6.7 5.9* 5.7*  ALBUMIN 4.2 4.0 3.5 3.1* 2.8*   Recent Labs  Lab 04/19/19 0049 04/20/19 1037  LIPASE 23 21  AMYLASE  --  34   No results for input(s): AMMONIA in the last 168 hours. Coagulation Profile: Recent Labs  Lab 04/19/19 0514 04/19/19 1846 04/20/19 0433 04/21/19 0402 04/22/19 1159  INR 4.8* 1.2 1.2 1.2 1.2   Cardiac Enzymes: Recent Labs  Lab 04/21/19 1554  CKTOTAL 473*   BNP (last 3 results) No results for input(s): PROBNP in the last 8760 hours. HbA1C: No results for input(s): HGBA1C in the last 72 hours. CBG: No results for input(s): GLUCAP in the last 168 hours. Lipid Profile: No results for input(s): CHOL, HDL, LDLCALC, TRIG, CHOLHDL, LDLDIRECT in the last 72 hours. Thyroid Function Tests: No results for input(s): TSH, T4TOTAL, FREET4, T3FREE, THYROIDAB in the last 72 hours. Anemia Panel: No results for input(s): VITAMINB12, FOLATE, FERRITIN, TIBC, IRON, RETICCTPCT in the last 72 hours. Sepsis Labs: No results for input(s): PROCALCITON, LATICACIDVEN in the last 168 hours.  Recent Results (from the past 240 hour(s))  Respiratory Panel by RT PCR (Flu A&B, Covid) - Nasopharyngeal Swab     Status: None   Collection Time: 04/19/19  7:29 AM   Specimen: Nasopharyngeal Swab  Result Value Ref Range Status   SARS Coronavirus 2 by RT PCR NEGATIVE NEGATIVE Final    Comment: (NOTE) SARS-CoV-2 target nucleic acids are NOT DETECTED. The SARS-CoV-2 RNA is generally detectable in upper respiratoy specimens during the acute phase of infection. The lowest concentration of SARS-CoV-2 viral copies this assay can detect is 131 copies/mL. A negative result does not preclude SARS-Cov-2 infection and should not be used as the sole basis for treatment or other patient management decisions. A negative result may occur with  improper specimen collection/handling, submission of specimen other than nasopharyngeal swab, presence of viral  mutation(s) within the areas targeted by this assay, and inadequate number of viral copies (<131 copies/mL). A negative result must be combined with clinical observations, patient history, and epidemiological information. The expected result is Negative. Fact Sheet for Patients:  PinkCheek.be Fact Sheet for Healthcare Providers:  GravelBags.it This test is not yet ap proved or cleared by the [place] FDA and  has been authorized for detection and/or diagnosis of SARS-CoV-2 by FDA under an Emergency Use Authorization (EUA). This EUA will remain  in effect (meaning this test can be used) for the duration of the COVID-19 declaration under Section 564(b)(1) of the Act, 21 U.S.C. section 360bbb-3(b)(1), unless the authorization is terminated or revoked sooner.    Influenza A by PCR NEGATIVE NEGATIVE Final   Influenza B by PCR NEGATIVE NEGATIVE Final    Comment: (NOTE) The Xpert Xpress SARS-CoV-2/FLU/RSV assay is intended as an aid in  the diagnosis of influenza from Nasopharyngeal swab specimens and  should not be used as a sole basis for treatment. Nasal washings and  aspirates are unacceptable for Xpert Xpress SARS-CoV-2/FLU/RSV  testing. Fact Sheet for Patients: PinkCheek.be Fact Sheet for Healthcare Providers: GravelBags.it This test is not yet approved or cleared by the [place] FDA and  has been authorized for detection and/or diagnosis of SARS-CoV-2 by  FDA under an Emergency Use Authorization (EUA). This EUA will remain  in effect (meaning this test can be used) for the duration of the  Covid-19 declaration under Section 564(b)(1) of the Act, 21  U.S.C. section 360bbb-3(b)(1), unless the authorization is  terminated or revoked. Performed at [facility], [place],  [place], [place] [address]       Radiology Studies: No results  found.   Scheduled Meds: . budesonide  2 mL Nebulization BID  . sodium chloride flush  3 mL Intravenous Q12H   Continuous Infusions: . sodium chloride 75 mL/hr at 04/22/19 0149     LOS: 3 days    Time spent: 25 minutes spent in the coordination of care today.    Jonnie Finner, DO [facility]  If 7PM-7AM, please contact night-coverage [email or web address] 04/22/2019, 4:24 PM

## 2019-04-23 ENCOUNTER — Encounter (HOSPITAL_COMMUNITY): Payer: Self-pay | Admitting: Internal Medicine

## 2019-04-23 LAB — CBC WITH DIFFERENTIAL/PLATELET
Abs Immature Granulocytes: 0.02 10*3/uL (ref 0.00–0.07)
Basophils Absolute: 0 10*3/uL (ref 0.0–0.1)
Basophils Relative: 1 %
Eosinophils Absolute: 0.3 10*3/uL (ref 0.0–0.5)
Eosinophils Relative: 5 %
HCT: 27.9 % — ABNORMAL LOW (ref 36.0–46.0)
Hemoglobin: 8.8 g/dL — ABNORMAL LOW (ref 12.0–15.0)
Immature Granulocytes: 0 %
Lymphocytes Relative: 27 %
Lymphs Abs: 1.6 10*3/uL (ref 0.7–4.0)
MCH: 28.8 pg (ref 26.0–34.0)
MCHC: 31.5 g/dL (ref 30.0–36.0)
MCV: 91.2 fL (ref 80.0–100.0)
Monocytes Absolute: 0.5 10*3/uL (ref 0.1–1.0)
Monocytes Relative: 9 %
Neutro Abs: 3.3 10*3/uL (ref 1.7–7.7)
Neutrophils Relative %: 58 %
Platelets: 220 10*3/uL (ref 150–400)
RBC: 3.06 MIL/uL — ABNORMAL LOW (ref 3.87–5.11)
RDW: 12.9 % (ref 11.5–15.5)
WBC: 5.7 10*3/uL (ref 4.0–10.5)
nRBC: 0 % (ref 0.0–0.2)

## 2019-04-23 LAB — HEPATIC FUNCTION PANEL
ALT: 12 U/L (ref 0–44)
AST: 19 U/L (ref 15–41)
Albumin: 2.7 g/dL — ABNORMAL LOW (ref 3.5–5.0)
Alkaline Phosphatase: 44 U/L (ref 38–126)
Bilirubin, Direct: 0.3 mg/dL — ABNORMAL HIGH (ref 0.0–0.2)
Indirect Bilirubin: 1.7 mg/dL — ABNORMAL HIGH (ref 0.3–0.9)
Total Bilirubin: 2 mg/dL — ABNORMAL HIGH (ref 0.3–1.2)
Total Protein: 5.5 g/dL — ABNORMAL LOW (ref 6.5–8.1)

## 2019-04-23 LAB — HEMOGLOBIN AND HEMATOCRIT, BLOOD
HCT: 29 % — ABNORMAL LOW (ref 36.0–46.0)
HCT: 28.9 % — ABNORMAL LOW (ref 36.0–46.0)
HCT: 28.8 % — ABNORMAL LOW (ref 36.0–46.0)
Hemoglobin: 9.1 g/dL — ABNORMAL LOW (ref 12.0–15.0)
Hemoglobin: 9.1 g/dL — ABNORMAL LOW (ref 12.0–15.0)
Hemoglobin: 9.1 g/dL — ABNORMAL LOW (ref 12.0–15.0)

## 2019-04-23 LAB — RENAL FUNCTION PANEL
Albumin: 2.6 g/dL — ABNORMAL LOW (ref 3.5–5.0)
Anion gap: 9 (ref 5–15)
BUN: 5 mg/dL — ABNORMAL LOW (ref 8–23)
CO2: 28 mmol/L (ref 22–32)
Calcium: 7.9 mg/dL — ABNORMAL LOW (ref 8.9–10.3)
Chloride: 100 mmol/L (ref 98–111)
Creatinine, Ser: 0.7 mg/dL (ref 0.44–1.00)
GFR calc Af Amer: >60 mL/min (ref >60)
GFR calc non Af Amer: >60 mL/min (ref >60)
Glucose, Bld: 83 mg/dL (ref 70–99)
Phosphorus: 2.6 mg/dL (ref 2.5–4.6)
Potassium: 3.5 mmol/L (ref 3.5–5.1)
Sodium: 137 mmol/L (ref 135–145)

## 2019-04-23 LAB — PROTIME-INR
INR: 1.2 (ref 0.8–1.2)
Prothrombin Time: 15 seconds (ref 11.4–15.2)

## 2019-04-23 LAB — MAGNESIUM: Magnesium: 1.8 mg/dL (ref 1.7–2.4)

## 2019-04-23 MED ORDER — POLYETHYLENE GLYCOL 3350 17 G PO PACK
17.0000 g | PACK | Freq: Two times a day (BID) | ORAL | Status: DC
  Administered 2019-04-23 – 2019-04-25 (×4): 17 g via ORAL
  Filled 2019-04-23 (×7): qty 1

## 2019-04-23 MED ORDER — POTASSIUM CHLORIDE CRYS ER 20 MEQ PO TBCR
40.0000 meq | EXTENDED_RELEASE_TABLET | Freq: Once | ORAL | Status: AC
  Administered 2019-04-23: 40 meq via ORAL
  Filled 2019-04-23: qty 2

## 2019-04-23 MED ORDER — MAGNESIUM SULFATE 2 GM/50ML IV SOLN
2.0000 g | Freq: Once | INTRAVENOUS | Status: AC
  Administered 2019-04-23: 2 g via INTRAVENOUS
  Filled 2019-04-23: qty 50

## 2019-04-23 NOTE — Progress Notes (Signed)
PROGRESS NOTE    Tina Duarte  [id number] DOB: [birth year] DOA: 04/19/2019 PCP: Einar Pheasant, MD   Brief Narrative:  HPI per Dr. Cherylann Ratel on 04/19/19 Mariaha Wagley Duarte is a 74 y.o. female with medical history significant of Factor V Leiden, HTN, chronic respiratory failure on 1L [place] at home. Was seen at [facility] for abdominal pain. She is unable to tell me the full story as she is intermittently confused. However, chart review shows that she had about 4 days of abdominal pain that was achy and constant. She's had poor appetite during this time. She denied any bowel habit changes at the time. She denies any urinary changes at the time. She denies any other alleviating or aggravating factors. She was evaluated by their ED and found to have a retroperitoneal hematoma. She was also found to have an INR of 4.8. A CTA was obtained that revealed the possibility of extravasation. She was evaluated by vascular surgery who suggested coagulation reversal and transfer to [facility] for possible IR intervention w/ angiography and embolization.   **Interim History  She did not actually undergo IR intervention with angiography and embolization her coagulopathy is reversed.  Hemoglobin/hematocrit is been relatively stable.  Patient still complain of some abdominal pain and diet is being advanced slowly.  Assessment & Plan:   Principal Problem:   Retroperitoneal hematoma Active Problems:   Essential hypertension, benign   Hypokalemia   Abdominal pain   Supratherapeutic INR   Factor V Leiden (HCC)  Retroperitoneal hematoma Abdominal pain Supratherapeutic INR -CT Angio Abd/Pelvis showed "Active extravasation into right retroperitoneal hematoma, which has slightly enlarged since earlier scan. No definite pseudoaneurysm or etiology of bleeding identified." -RUQ showed "There is a 9.3 x 7.2 x 5.2 cm rounded masslike collection with a fluid fluid level inferior to the liver and gallbladder. This masslike collection  does not definitely arise from the aorta or right kidney. No internal blood flow. No correlate for this masslike collection was seen on the CT scan from January 2015. Recommend a CT scan of the abdomen and pelvis with intravenous and oral contrast. The gallbladder is distended but otherwise unremarkable. No stones, wall thickening, pericholecystic fluid, or Murphy's sign." -S/p 2 units FFP and vit K at [place] -if INR did not stabilize and she becomes unstable, will need to get IR involved and get mesenteric angiography and embolization; Dr. Marylyn Ishihara spoke with IR (Dr. Laurence Ferrari), they are aware of the patient -Admitted to inpt, med-tele; expect greater than 2MN stay -Continue to trend H&H q6h, transfuse as necessary -On 3/22:  Dr. Marylyn Ishihara spoke with IR about rpt imaging, hold for now; Hgb and INR are stable; she is improving. Start diet. -On 3/23: still having ab pain. Have added more consistent PRNs and BM regimen; Hgb is stable -Today the hemoglobin dropped a little bit and she still complain of some abdominal pain but will try and advance her diet to full -We will need to discuss with interventional radiology as well as oncology prior to discharge -Continue IV fluid hydration with normal saline at 75 mL's per hour  N/V -C/w zofran,phenergan,fluids -Initially was NPO but was started on a clear liquid diet and still continued to have some abdominal pain -Continue with IV fluid hydration with normal saline at 75 mL's per hour until more consistent p.o. intake  Hypokalemia Hypomagnesemia -Improving -Patient's potassium is 3.5 today and magnesium is 1.8 -Continue to monitor and replete as necessary -Repeat CMP and Magnesium in the a.m.  Factor V  Leiden -Normally on coumadin -She was initially subtherapeutic but now has a therapeutic INR with 1.2 -On 3/22: Held coumadin for now as the risks of restarting bleed into hematoma are too high -3/23: INR is holding stable, risks of restarting  bleed are high with resuming coumadin;  -We will discuss with heme-onc prior to discharge for any further recommendations as she does have a history of LLE clots per family   HTN -Has had N/V right now so has had inconsistent po intake for home meds -Had PRN hydralazine for SBP >170 -Elevations in BP likely related to ab pain -Last blood pressure was 128/61  Obesity -Estimated body mass index is 39.94 kg/m as calculated from the following:   Height as of this encounter: 5\' 5"  (1.651 m).   Weight as of this encounter: 108.9 kg. -Dietary and Weight Loss Counseling given   Hyperbilirubinemia -Has been fluctuating and today was 2.0 -Continue monitor and trend -Repeat CMP in a.m. and RUQ U/S done   DVT prophylaxis: SCDs Code Status: FULL CODE  Family Communication: No family present at bedside  Disposition Plan: Remain Inpatient until able to tolerate po and ensure that her Hb/Hct is not dropping further  Consultants:   Vascular Surgery   Case was Discussed with IR    Procedures: None   Antimicrobials:  Anti-infectives (From admission, onward)   None     Subjective: Seen and examined at bedside she still complaining some abdominal pain.  No lightheadedness or dizziness.  Does not feel well and still not had a bowel movement yet.  No other concerns or plans at this time  Objective: Vitals:   04/23/19 0623 04/23/19 0809 04/23/19 0841 04/23/19 1351  BP:   125/64 (!) 148/75  Pulse:   73 83  Resp:   12 (!) 21  Temp:   98.2 F (36.8 C) 98.6 F (37 C)  TempSrc:   Oral Oral  SpO2: 94% 95% 93% 96%  Weight:      Height:        Intake/Output Summary (Last 24 hours) at 04/23/2019 1703 Last data filed at 04/23/2019 1300 Gross per 24 hour  Intake 270 ml  Output 200 ml  Net 70 ml   Filed Weights   04/19/19 1727  Weight: 108.9 kg   Examination: Physical Exam:  Constitutional: WN/WD Caucasian female currently in NAD and appears anxious but does seem  uncomfortable Eyes: Lids and conjunctivae normal, sclerae anicteric  ENMT: External Ears, Nose appear normal. Grossly normal hearing.  Neck: Appears normal, supple, no cervical masses, normal ROM, no appreciable thyromegaly medical no JVD Respiratory: Diminished to auscultation bilaterally, no wheezing, rales, rhonchi or crackles. Normal respiratory effort and patient is not tachypenic. No accessory muscle use.  Unlabored breathing Cardiovascular: RRR, no murmurs / rubs / gallops. S1 and S2 auscultated.  Mild lower extremity edema Abdomen: Soft, tender to palpate, distended secondary body habitus. Bowel sounds positive.  GU: Deferred. Musculoskeletal: No clubbing / cyanosis of digits/nails. No joint deformity upper and lower extremities.  Skin: No rashes, lesions, ulcers on limited skin evaluation. No induration; Warm and dry.  Neurologic: CN 2-12 grossly intact with no focal deficits. Romberg sign cerebellar reflexes not assessed.  Psychiatric: Normal judgment and insight. Alert and oriented x 3.  Anxious mood and appropriate affect.   Data Reviewed: I have personally reviewed following labs and imaging studies  CBC: Recent Labs  Lab 04/19/19 1846 04/19/19 2205 04/20/19 [id number] 04/20/19 1037 04/21/19 0402 04/21/19 0919 04/22/19 0311 04/22/19  1029 04/22/19 1526 04/22/19 2322 04/23/19 0343 04/23/19 0935 04/23/19 1528  WBC 12.3*  --  12.5*  --  7.3  --  6.8  --   --   --  5.7  --   --   NEUTROABS  --   --   --   --  4.8  --  4.0  --   --   --  3.3  --   --   HGB 12.1   < > 11.3*   < > 10.0*   < > 9.2*   < > 9.8* 10.8* 8.8* 9.1* 9.1*  HCT 35.9*   < > 33.8*   < > 31.7*   < > 29.0*   < > 30.3* 34.4* 27.9* 29.0* 28.9*  MCV 86.1  --  87.3  --  89.5  --  90.9  --   --   --  91.2  --   --   PLT 305  --  258  --  199  --  195  --   --   --  220  --   --    < > = values in this interval not displayed.   Basic Metabolic Panel: Recent Labs  Lab 04/19/19 1846 04/20/19 0433 04/21/19 0402  04/22/19 0311 04/23/19 0343  NA 136 137 139 136 137  K 3.7 3.5 3.1* 3.7 3.5  CL 93* 96* 100 101 100  CO2 27 27 27 29 28   GLUCOSE 150* 126* 97 99 83  BUN 7* 8 9 8  5*  CREATININE 0.95 0.89 0.82 0.81 0.70  CALCIUM 8.8* 8.6* 8.2* 7.9* 7.9*  MG 1.4*  --  2.0 1.8 1.8  PHOS  --   --   --   --  2.6   GFR: Estimated Creatinine Clearance: 74.6 mL/min (by C-G formula based on SCr of 0.7 mg/dL). Liver Function Tests: Recent Labs  Lab 04/19/19 1846 04/19/19 1846 04/20/19 0433 04/21/19 0402 04/22/19 0311 04/23/19 0343 04/23/19 0935  AST 29  --  21 21 19   --  19  ALT 18  --  13 13 13   --  12  ALKPHOS 63  --  53 47 41  --  44  BILITOT 2.8*  --  2.1* 2.0* 1.7*  --  2.0*  PROT 7.1  --  6.7 5.9* 5.7*  --  5.5*  ALBUMIN 4.0   < > 3.5 3.1* 2.8* 2.6* 2.7*   < > = values in this interval not displayed.   Recent Labs  Lab 04/19/19 0049 04/20/19 1037  LIPASE 23 21  AMYLASE  --  34   No results for input(s): AMMONIA in the last 168 hours. Coagulation Profile: Recent Labs  Lab 04/19/19 1846 04/20/19 0433 04/21/19 0402 04/22/19 1159 04/23/19 0343  INR 1.2 1.2 1.2 1.2 1.2   Cardiac Enzymes: Recent Labs  Lab 04/21/19 1554  CKTOTAL 473*   BNP (last 3 results) No results for input(s): PROBNP in the last 8760 hours. HbA1C: No results for input(s): HGBA1C in the last 72 hours. CBG: No results for input(s): GLUCAP in the last 168 hours. Lipid Profile: No results for input(s): CHOL, HDL, LDLCALC, TRIG, CHOLHDL, LDLDIRECT in the last 72 hours. Thyroid Function Tests: No results for input(s): TSH, T4TOTAL, FREET4, T3FREE, THYROIDAB in the last 72 hours. Anemia Panel: No results for input(s): VITAMINB12, FOLATE, FERRITIN, TIBC, IRON, RETICCTPCT in the last 72 hours. Sepsis Labs: No results for input(s): PROCALCITON, LATICACIDVEN in the last 168 hours.  Recent Results (from the past 240 hour(s))  Respiratory Panel by RT PCR (Flu A&B, Covid) - Nasopharyngeal Swab     Status: None    Collection Time: 04/19/19  7:29 AM   Specimen: Nasopharyngeal Swab  Result Value Ref Range Status   SARS Coronavirus 2 by RT PCR NEGATIVE NEGATIVE Final    Comment: (NOTE) SARS-CoV-2 target nucleic acids are NOT DETECTED. The SARS-CoV-2 RNA is generally detectable in upper respiratoy specimens during the acute phase of infection. The lowest concentration of SARS-CoV-2 viral copies this assay can detect is 131 copies/mL. A negative result does not preclude SARS-Cov-2 infection and should not be used as the sole basis for treatment or other patient management decisions. A negative result may occur with  improper specimen collection/handling, submission of specimen other than nasopharyngeal swab, presence of viral mutation(s) within the areas targeted by this assay, and inadequate number of viral copies (<131 copies/mL). A negative result must be combined with clinical observations, patient history, and epidemiological information. The expected result is Negative. Fact Sheet for Patients:  PinkCheek.be Fact Sheet for Healthcare Providers:  GravelBags.it This test is not yet ap proved or cleared by the [place] FDA and  has been authorized for detection and/or diagnosis of SARS-CoV-2 by FDA under an Emergency Use Authorization (EUA). This EUA will remain  in effect (meaning this test can be used) for the duration of the COVID-19 declaration under Section 564(b)(1) of the Act, 21 U.S.C. section 360bbb-3(b)(1), unless the authorization is terminated or revoked sooner.    Influenza A by PCR NEGATIVE NEGATIVE Final   Influenza B by PCR NEGATIVE NEGATIVE Final    Comment: (NOTE) The Xpert Xpress SARS-CoV-2/FLU/RSV assay is intended as an aid in  the diagnosis of influenza from Nasopharyngeal swab specimens and  should not be used as a sole basis for treatment. Nasal washings and  aspirates are unacceptable for Xpert Xpress  SARS-CoV-2/FLU/RSV  testing. Fact Sheet for Patients: PinkCheek.be Fact Sheet for Healthcare Providers: GravelBags.it This test is not yet approved or cleared by the [place] FDA and  has been authorized for detection and/or diagnosis of SARS-CoV-2 by  FDA under an Emergency Use Authorization (EUA). This EUA will remain  in effect (meaning this test can be used) for the duration of the  Covid-19 declaration under Section 564(b)(1) of the Act, 21  U.S.C. section 360bbb-3(b)(1), unless the authorization is  terminated or revoked. Performed at [facility], [address], [place], [place] [address]     Radiology Studies: No results found.  Scheduled Meds: . budesonide  2 mL Nebulization BID  . docusate sodium  100 mg Oral BID  . sodium chloride flush  3 mL Intravenous Q12H   Continuous Infusions: . sodium chloride 75 mL/hr at 04/22/19 0149    LOS: 4 days   Kerney Elbe, DO [facility] PAGER is on [place]  If 7PM-7AM, please contact night-coverage [email or web address]

## 2019-04-24 ENCOUNTER — Inpatient Hospital Stay (HOSPITAL_COMMUNITY): Payer: Medicare Other

## 2019-04-24 LAB — CBC WITH DIFFERENTIAL/PLATELET
Abs Immature Granulocytes: 0.02 10*3/uL (ref 0.00–0.07)
Basophils Absolute: 0 10*3/uL (ref 0.0–0.1)
Basophils Relative: 1 %
Eosinophils Absolute: 0.3 10*3/uL (ref 0.0–0.5)
Eosinophils Relative: 5 %
HCT: 28.8 % — ABNORMAL LOW (ref 36.0–46.0)
Hemoglobin: 9.2 g/dL — ABNORMAL LOW (ref 12.0–15.0)
Immature Granulocytes: 0 %
Lymphocytes Relative: 22 %
Lymphs Abs: 1.3 10*3/uL (ref 0.7–4.0)
MCH: 28.8 pg (ref 26.0–34.0)
MCHC: 31.9 g/dL (ref 30.0–36.0)
MCV: 90.3 fL (ref 80.0–100.0)
Monocytes Absolute: 0.5 10*3/uL (ref 0.1–1.0)
Monocytes Relative: 9 %
Neutro Abs: 3.6 10*3/uL (ref 1.7–7.7)
Neutrophils Relative %: 63 %
Platelets: 251 10*3/uL (ref 150–400)
RBC: 3.19 MIL/uL — ABNORMAL LOW (ref 3.87–5.11)
RDW: 12.7 % (ref 11.5–15.5)
WBC: 5.7 10*3/uL (ref 4.0–10.5)
nRBC: 0.3 % — ABNORMAL HIGH (ref 0.0–0.2)

## 2019-04-24 LAB — COMPREHENSIVE METABOLIC PANEL
ALT: 13 U/L (ref 0–44)
AST: 23 U/L (ref 15–41)
Albumin: 2.9 g/dL — ABNORMAL LOW (ref 3.5–5.0)
Alkaline Phosphatase: 48 U/L (ref 38–126)
Anion gap: 7 (ref 5–15)
BUN: 6 mg/dL — ABNORMAL LOW (ref 8–23)
CO2: 30 mmol/L (ref 22–32)
Calcium: 8.3 mg/dL — ABNORMAL LOW (ref 8.9–10.3)
Chloride: 102 mmol/L (ref 98–111)
Creatinine, Ser: 0.76 mg/dL (ref 0.44–1.00)
GFR calc Af Amer: >60 mL/min (ref >60)
GFR calc non Af Amer: >60 mL/min (ref >60)
Glucose, Bld: 92 mg/dL (ref 70–99)
Potassium: 4.3 mmol/L (ref 3.5–5.1)
Sodium: 139 mmol/L (ref 135–145)
Total Bilirubin: 2.6 mg/dL — ABNORMAL HIGH (ref 0.3–1.2)
Total Protein: 5.7 g/dL — ABNORMAL LOW (ref 6.5–8.1)

## 2019-04-24 LAB — HEMOGLOBIN AND HEMATOCRIT, BLOOD
HCT: 28.1 % — ABNORMAL LOW (ref 36.0–46.0)
HCT: 29.1 % — ABNORMAL LOW (ref 36.0–46.0)
Hemoglobin: 8.8 g/dL — ABNORMAL LOW (ref 12.0–15.0)
Hemoglobin: 9.4 g/dL — ABNORMAL LOW (ref 12.0–15.0)

## 2019-04-24 LAB — MAGNESIUM: Magnesium: 1.9 mg/dL (ref 1.7–2.4)

## 2019-04-24 LAB — PHOSPHORUS: Phosphorus: 2.5 mg/dL (ref 2.5–4.6)

## 2019-04-24 MED ORDER — BISACODYL 10 MG RE SUPP: 10.0000 mg | Freq: Every day | RECTAL | Status: DC | PRN

## 2019-04-24 MED ORDER — SENNOSIDES-DOCUSATE SODIUM 8.6-50 MG PO TABS
1.0000 | ORAL_TABLET | Freq: Two times a day (BID) | ORAL | Status: DC
  Administered 2019-04-24 – 2019-04-25 (×3): 1 via ORAL
  Filled 2019-04-24 (×6): qty 1

## 2019-04-24 NOTE — Progress Notes (Signed)
PROGRESS NOTE    Tina Duarte  [id number] DOB: 06-23-1945 DOA: 04/19/2019 PCP: Einar Pheasant, MD   Brief Narrative:  HPI per Dr. Cherylann Ratel on 04/19/19 Tina Duarte is a 75 y.o. female with medical history significant of Factor V Leiden, HTN, chronic respiratory failure on 1L  at home. Was seen at [facility] for abdominal pain. She is unable to tell me the full story as she is intermittently confused. However, chart review shows that she had about 4 days of abdominal pain that was achy and constant. She's had poor appetite during this time. She denied any bowel habit changes at the time. She denies any urinary changes at the time. She denies any other alleviating or aggravating factors. She was evaluated by their ED and found to have a retroperitoneal hematoma. She was also found to have an INR of 4.8. A CTA was obtained that revealed the possibility of extravasation. She was evaluated by vascular surgery who suggested coagulation reversal and transfer to [facility] for possible IR intervention w/ angiography and embolization.   **Interim History  She did not actually undergo IR intervention with angiography and embolization given that her coagulopathy is reversed.  Hemoglobin/hematocrit is been relatively stable for the last few days.  Patient still complain of some abdominal pain and diet is being advanced slowly. Will advance to SOFT diet now. PT/OT recommending Home Health.   Assessment & Plan:   Principal Problem:   Retroperitoneal hematoma Active Problems:   Essential hypertension, benign   Hypokalemia   Abdominal pain   Supratherapeutic INR   Factor V Leiden (HCC)  Retroperitoneal hematoma Abdominal pain Supratherapeutic INR -CT Angio Abd/Pelvis showed "Active extravasation into right retroperitoneal hematoma, which has slightly enlarged since earlier scan. No definite pseudoaneurysm or etiology of bleeding identified." -RUQ showed "There is a 9.3 x 7.2 x 5.2 cm rounded masslike  collection with a fluid fluid level inferior to the liver and gallbladder. This masslike collection does not definitely arise from the aorta or right kidney. No internal blood flow. No correlate for this masslike collection was seen on the CT scan from January 2015. Recommend a CT scan of the abdomen and pelvis with intravenous and oral contrast. The gallbladder is distended but otherwise unremarkable. No stones, wall thickening, pericholecystic fluid, or Murphy's sign." -S/p 2 units FFP and vit K at [place] -if INR did not stabilize and she becomes unstable, will need to get IR involved and get mesenteric angiography and embolization; Dr. Marylyn Ishihara spoke with IR (Dr. Laurence Ferrari), they are aware of the patient -Admitted to inpt, med-tele; expect greater than 2MN stay -Continue dto trend H&H q6h but will stop now that is relatively stable, transfuse as necessary -On 3/22:  Dr. Marylyn Ishihara spoke with IR about repeat imaging, hold for now; Hgb and INR are stable; she is improving. Start diet. -On 3/23: still having ab pain. Have added more consistent PRNs and BM regimen; Hgb is stable -Yesterday the hemoglobin dropped a little bit and she still complain of some abdominal pain but will try and advance her diet; Will go from FULL to SOFT today -We will need to discuss with interventional radiology as well as oncology prior to discharge; -I spoke with Dr. Maylon Peppers of Hematology who recommends not starting any anticoagulation for least 4 to 8 weeks if it is even necessary.  Patient has had a history of DVT in the past and Dr. Vertell Novak recommends finding out she has a homozygous or heterozygous factor V Leiden deficiency: He  recommends outpatient follow-up with hematology at [facility] and recommends early ambulation and SCDs for the patient currently -Continued IV fluid hydration with normal saline at 75 mL's per hour but now stopped  N/V, improving -C/w zofran,phenergan,fluids -Initially was NPO but was started on a clear  liquid diet and still continued to have some abdominal pain; no longer nauseous or vomiting and diet was advanced to full and will go to soft -Continued with IV fluid hydration with normal saline at 75 mL's per hour will now be stopped  Hypokalemia Hypomagnesemia -Improving -Patient's potassium is 4.3 today and magnesium is 1.9 -Continue to monitor and replete as necessary -Repeat CMP and Magnesium in the a.m.  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 -She was initially subtherapeutic but now has a therapeutic INR with 1.2 -On 3/22: Held coumadin for now as the risks of restarting bleed into hematoma are too high -3/23: INR is holding stable, risks of restarting bleed are high with resuming coumadin;  -Discussed with heme-onc and they been holding all in any anticoagulation for at least 4 to 8 weeks and reevaluating if she actually needs anticoagulation to see if she is got a homozygous or heterozygous factor V Leiden deficiency  HTN -Has had N/V right now so has had inconsistent po intake for home meds -Had PRN hydralazine for SBP >170 -Elevations in BP likely related to ab pain -Last blood pressure was 163/75  Obesity -Estimated body mass index is 39.94 kg/m as calculated from the following:   Height as of this encounter: 5\' 5"  (1.651 m).   Weight as of this encounter: 108.9 kg. -Dietary and Weight Loss Counseling given   Hyperbilirubinemia -Has been fluctuating and today it was 2.6 -Continue monitor and trend -Repeat CMP in a.m. and RUQ U/S done earlier -If continues to worsen may need GI involvement -Patient was complaining of some right lower quadrant and hip pain so a DG hip was obtained and showed "Frontal pelvis as well as frontal and lateral right hip images obtained. Bones are osteoporotic. No fracture or dislocation. There is mild symmetric narrowing of each hip joint. No evident erosive change. Sacroiliac joints bilaterally appear unremarkable."  DVT  prophylaxis: SCDs Code Status: FULL CODE  Family Communication: No family present at bedside  Disposition Plan: Remain Inpatient until able to tolerate po and ensure that her Hb/Hct is not dropping further  Consultants:   Vascular Surgery   Case was Discussed with IR    Procedures: None   Antimicrobials:  Anti-infectives (From admission, onward)   None     Subjective: Seen and examined at bedside and states that she is doing okay still complaining of some abdominal pain and was now pointed pain in her hip and to her groin.  Denies Any lightheadedness or dizziness.  Was able to tolerate full liquid diet without vomiting.  No other concerns or complaints at this time except her diffuse abdominal pain.   Objective: Vitals:   04/23/19 1943 04/23/19 1951 04/24/19 0017 04/24/19 0542  BP:  128/61 131/72 (!) 159/81  Pulse: 75 76 83 80  Resp: 16 13 14 12   Temp:  98.4 F (36.9 C) 98.4 F (36.9 C) 98.2 F (36.8 C)  TempSrc:  Oral Oral Oral  SpO2: 98% 90% 95% 96%  Weight:      Height:        Intake/Output Summary (Last 24 hours) at 04/24/2019 [id number] Last data filed at 04/24/2019 0303 Gross per 24 hour  Intake 1262.43 ml  Output 800 ml  Net 462.43 ml   Filed Weights   04/19/19 1727  Weight: 108.9 kg   Examination: Physical Exam:  Constitutional: WN/WD Caucasian female in NAD and appears calm but does appear uncomfortable  Eyes: Lids and conjunctivae normal, sclerae anicteric  ENMT: External Ears, Nose appear normal. Grossly normal hearing.  Neck: Appears normal, supple, no cervical masses, normal ROM, no appreciable thyromegaly; no JVD Respiratory: Diminished to auscultation bilaterally, no wheezing, rales, rhonchi or crackles. Normal respiratory effort and patient is not tachypenic. No accessory muscle use. Unlabored breathing but is wearing supplemental O2 via [place] Cardiovascular: RRR, no murmurs / rubs / gallops. S1 and S2 auscultated. Mild extremity edema. Abdomen: Soft,  Tender to palpate, Distended 2/2 to body habitus. Bowel sounds positive x4.  GU: Deferred. Musculoskeletal: No clubbing / cyanosis of digits/nails. Normal strength and muscle tone.  Skin: No rashes, lesions, ulcers. No induration; Warm and dry.  Neurologic: CN 2-12 grossly intact with no focal deficits. Romberg sign and cerebellar reflexes not assessed.  Psychiatric: Normal judgment and insight. Alert and oriented x 3. Anxious and depressed mood and appropriate affect.   Data Reviewed: I have personally reviewed following labs and imaging studies  CBC: Recent Labs  Lab 04/19/19 1846 04/19/19 2205 04/20/19 0433 04/20/19 1037 04/21/19 0402 04/21/19 0919 04/22/19 0311 04/22/19 1029 04/23/19 0343 04/23/19 0935 04/23/19 1528 04/23/19 2226 04/24/19 0354  WBC 12.3*  --  12.5*  --  7.3  --  6.8  --  5.7  --   --   --   --   NEUTROABS  --   --   --   --  4.8  --  4.0  --  3.3  --   --   --   --   HGB 12.1   < > 11.3*   < > 10.0*   < > 9.2*   < > 8.8* 9.1* 9.1* 9.1* 8.8*  HCT 35.9*   < > 33.8*   < > 31.7*   < > 29.0*   < > 27.9* 29.0* 28.9* 28.8* 28.1*  MCV 86.1  --  87.3  --  89.5  --  90.9  --  91.2  --   --   --   --   PLT 305  --  258  --  199  --  195  --  220  --   --   --   --    < > = values in this interval not displayed.   Basic Metabolic Panel: Recent Labs  Lab 04/19/19 1846 04/20/19 0433 04/21/19 0402 04/22/19 0311 04/23/19 0343  NA 136 137 139 136 137  K 3.7 3.5 3.1* 3.7 3.5  CL 93* 96* 100 101 100  CO2 27 27 27 29 28   GLUCOSE 150* 126* 97 99 83  BUN 7* 8 9 8  5*  CREATININE 0.95 0.89 0.82 0.81 0.70  CALCIUM 8.8* 8.6* 8.2* 7.9* 7.9*  MG 1.4*  --  2.0 1.8 1.8  PHOS  --   --   --   --  2.6   GFR: Estimated Creatinine Clearance: 74.6 mL/min (by C-G formula based on SCr of 0.7 mg/dL). Liver Function Tests: Recent Labs  Lab 04/19/19 1846 04/19/19 1846 04/20/19 0433 04/21/19 0402 04/22/19 0311 04/23/19 0343 04/23/19 0935  AST 29  --  21 21 19   --  19  ALT  18  --  13 13 13   --  12  ALKPHOS 63  --  53 47 41  --  44  BILITOT 2.8*  --  2.1* 2.0* 1.7*  --  2.0*  PROT 7.1  --  6.7 5.9* 5.7*  --  5.5*  ALBUMIN 4.0   < > 3.5 3.1* 2.8* 2.6* 2.7*   < > = values in this interval not displayed.   Recent Labs  Lab 04/19/19 0049 04/20/19 1037  LIPASE 23 21  AMYLASE  --  34   No results for input(s): AMMONIA in the last 168 hours. Coagulation Profile: Recent Labs  Lab 04/19/19 1846 04/20/19 0433 04/21/19 0402 04/22/19 1159 04/23/19 0343  INR 1.2 1.2 1.2 1.2 1.2   Cardiac Enzymes: Recent Labs  Lab 04/21/19 1554  CKTOTAL 473*   BNP (last 3 results) No results for input(s): PROBNP in the last 8760 hours. HbA1C: No results for input(s): HGBA1C in the last 72 hours. CBG: No results for input(s): GLUCAP in the last 168 hours. Lipid Profile: No results for input(s): CHOL, HDL, LDLCALC, TRIG, CHOLHDL, LDLDIRECT in the last 72 hours. Thyroid Function Tests: No results for input(s): TSH, T4TOTAL, FREET4, T3FREE, THYROIDAB in the last 72 hours. Anemia Panel: No results for input(s): VITAMINB12, FOLATE, FERRITIN, TIBC, IRON, RETICCTPCT in the last 72 hours. Sepsis Labs: No results for input(s): PROCALCITON, LATICACIDVEN in the last 168 hours.  Recent Results (from the past 240 hour(s))  Respiratory Panel by RT PCR (Flu A&B, Covid) - Nasopharyngeal Swab     Status: None   Collection Time: 04/19/19  7:29 AM   Specimen: Nasopharyngeal Swab  Result Value Ref Range Status   SARS Coronavirus 2 by RT PCR NEGATIVE NEGATIVE Final    Comment: (NOTE) SARS-CoV-2 target nucleic acids are NOT DETECTED. The SARS-CoV-2 RNA is generally detectable in upper respiratoy specimens during the acute phase of infection. The lowest concentration of SARS-CoV-2 viral copies this assay can detect is 131 copies/mL. A negative result does not preclude SARS-Cov-2 infection and should not be used as the sole basis for treatment or other patient management decisions.  A negative result may occur with  improper specimen collection/handling, submission of specimen other than nasopharyngeal swab, presence of viral mutation(s) within the areas targeted by this assay, and inadequate number of viral copies (<131 copies/mL). A negative result must be combined with clinical observations, patient history, and epidemiological information. The expected result is Negative. Fact Sheet for Patients:  PinkCheek.be Fact Sheet for Healthcare Providers:  GravelBags.it This test is not yet ap proved or cleared by the [place] FDA and  has been authorized for detection and/or diagnosis of SARS-CoV-2 by FDA under an Emergency Use Authorization (EUA). This EUA will remain  in effect (meaning this test can be used) for the duration of the COVID-19 declaration under Section 564(b)(1) of the Act, 21 U.S.C. section 360bbb-3(b)(1), unless the authorization is terminated or revoked sooner.    Influenza A by PCR NEGATIVE NEGATIVE Final   Influenza B by PCR NEGATIVE NEGATIVE Final    Comment: (NOTE) The Xpert Xpress SARS-CoV-2/FLU/RSV assay is intended as an aid in  the diagnosis of influenza from Nasopharyngeal swab specimens and  should not be used as a sole basis for treatment. Nasal washings and  aspirates are unacceptable for Xpert Xpress SARS-CoV-2/FLU/RSV  testing. Fact Sheet for Patients: PinkCheek.be Fact Sheet for Healthcare Providers: GravelBags.it This test is not yet approved or cleared by the [place] FDA and  has been authorized for detection and/or diagnosis of SARS-CoV-2 by  FDA under an Emergency Use Authorization (  EUA). This EUA will remain  in effect (meaning this test can be used) for the duration of the  Covid-19 declaration under Section 564(b)(1) of the Act, 21  U.S.C. section 360bbb-3(b)(1), unless the authorization is   terminated or revoked. Performed at [facility], [address], [place], [place] [address]     Radiology Studies: No results found.  Scheduled Meds: . budesonide  2 mL Nebulization BID  . docusate sodium  100 mg Oral BID  . polyethylene glycol  17 g Oral BID  . sodium chloride flush  3 mL Intravenous Q12H   Continuous Infusions: . sodium chloride 75 mL/hr at 04/24/19 0303    LOS: 5 days   Kerney Elbe, DO [facility] PAGER is on AMION  If 7PM-7AM, please contact night-coverage [email or web address]

## 2019-04-24 NOTE — Evaluation (Signed)
Physical Therapy Evaluation Patient Details Name: Tina Duarte MRN: [id number] DOB: 20-Mar-1945 Today's Date: 04/24/2019   History of Present Illness  Patient is a 74 year old female admitted from home with abdominal pain. PMH includes: HTN, ashtma, depression and HLD.  Clinical Impression  Patient received in bed, talkative, agrees to PT session. Patient requires min assist when getting back into bed, otherwise mod independent. Transfers with min guard/supervision to bed side commode and sit to stand. She was able to ambulate 75 feet with RW and min guard assist. Limited by fatigue. Patient will continue to benefit from skilled PT while here to improve strength and functional independence for return home at discharge.     Follow Up Recommendations Home health PT;Supervision - Intermittent    Equipment Recommendations  None recommended by PT    Recommendations for Other Services       Precautions / Restrictions Precautions Precautions: Fall Restrictions Weight Bearing Restrictions: No      Mobility  Bed Mobility Overal bed mobility: Needs Assistance Bed Mobility: Supine to Sit;Sit to Supine     Supine to sit: Modified independent (Device/Increase time) Sit to supine: Min assist   General bed mobility comments: min assist to bring Les back up onto bed. Assist to position in bed  Transfers Overall transfer level: Needs assistance Equipment used: Rolling walker (2 wheeled) Transfers: Sit to/from [facility] Sit to Stand: Min guard Stand pivot transfers: Min guard          Ambulation/Gait Ambulation/Gait assistance: Min guard Gait Distance (Feet): 75 Feet Assistive device: Rolling walker (2 wheeled) Gait Pattern/deviations: Step-through pattern;Shuffle;Decreased step length - right;Decreased step length - left Gait velocity: decr   General Gait Details: patient slightly fatigued and did not want to go further  Stairs            Wheelchair  Mobility    Modified Rankin (Stroke Patients Only)       Balance Overall balance assessment: Mild deficits observed, not formally tested                                           Pertinent Vitals/Pain Pain Assessment: Faces Faces Pain Scale: Hurts a little bit Pain Location: groin with supine to sit Pain Descriptors / Indicators: Sore Pain Intervention(s): Monitored during session    Home Living Family/patient expects to be discharged to:: Private residence Living Arrangements: Children Available Help at Discharge: Available 24 hours/day Type of Home: House Home Access: Level entry     Home Layout: One level Home Equipment: Environmental consultant - 2 wheels;Cane - single point      Prior Function Level of Independence: Independent with assistive device(s)         Comments: She lives with her son, she reports she does not leave the home much unless for MD appointment. Son does shopping/cooking.     Hand Dominance        Extremity/Trunk Assessment   Upper Extremity Assessment Upper Extremity Assessment: Overall WFL for tasks assessed    Lower Extremity Assessment Lower Extremity Assessment: Overall WFL for tasks assessed    Cervical / Trunk Assessment Cervical / Trunk Assessment: Normal  Communication   Communication: No difficulties  Cognition Arousal/Alertness: Awake/alert Behavior During Therapy: WFL for tasks assessed/performed Overall Cognitive Status: Within Functional Limits for tasks assessed  General Comments      Exercises     Assessment/Plan    PT Assessment Patient needs continued PT services  PT Problem List Decreased strength;Decreased mobility;Decreased activity tolerance;Decreased balance;Pain;Cardiopulmonary status limiting activity       PT Treatment Interventions Therapeutic exercise;Gait training;Functional mobility training;Therapeutic activities;Patient/family  education    PT Goals (Current goals can be found in the Care Plan section)  Acute Rehab PT Goals Patient Stated Goal: to return home at discharge PT Goal Formulation: With patient Time For Goal Achievement: 05/08/19 Potential to Achieve Goals: Good    Frequency Min 3X/week   Barriers to discharge        Co-evaluation               AM-PAC PT "6 Clicks" Mobility  Outcome Measure Help needed turning from your back to your side while in a flat bed without using bedrails?: None Help needed moving from lying on your back to sitting on the side of a flat bed without using bedrails?: A Little Help needed moving to and from a bed to a chair (including a wheelchair)?: A Little Help needed standing up from a chair using your arms (e.g., wheelchair or bedside chair)?: A Little Help needed to walk in hospital room?: A Little Help needed climbing 3-5 steps with a railing? : A Little 6 Click Score: 19    End of Session Equipment Utilized During Treatment: Gait belt Activity Tolerance: Patient tolerated treatment well;Patient limited by fatigue Patient left: in bed;with call bell/phone within reach;with bed alarm set Nurse Communication: Mobility status PT Visit Diagnosis: Difficulty in walking, not elsewhere classified (R26.2);Muscle weakness (generalized) (M62.81);History of falling (Z91.81);Pain Pain - part of body: (groin)    Time: [id number] PT Time Calculation (min) (ACUTE ONLY): 21 min   Charges:   PT Evaluation $PT Eval Moderate Complexity: 1 Mod PT Treatments $Gait Training: 8-22 mins        Beila Purdie, PT, GCS 04/24/19,4:02 PM

## 2019-04-24 NOTE — Plan of Care (Signed)

## 2019-04-25 LAB — CBC WITH DIFFERENTIAL/PLATELET
Abs Immature Granulocytes: 0.02 10*3/uL (ref 0.00–0.07)
Basophils Absolute: 0 10*3/uL (ref 0.0–0.1)
Basophils Relative: 1 %
Eosinophils Absolute: 0.3 10*3/uL (ref 0.0–0.5)
Eosinophils Relative: 5 %
HCT: 29.1 % — ABNORMAL LOW (ref 36.0–46.0)
Hemoglobin: 9 g/dL — ABNORMAL LOW (ref 12.0–15.0)
Immature Granulocytes: 0 %
Lymphocytes Relative: 24 %
Lymphs Abs: 1.4 10*3/uL (ref 0.7–4.0)
MCH: 27.9 pg (ref 26.0–34.0)
MCHC: 30.9 g/dL (ref 30.0–36.0)
MCV: 90.1 fL (ref 80.0–100.0)
Monocytes Absolute: 0.6 10*3/uL (ref 0.1–1.0)
Monocytes Relative: 11 %
Neutro Abs: 3.4 10*3/uL (ref 1.7–7.7)
Neutrophils Relative %: 59 %
Platelets: 288 10*3/uL (ref 150–400)
RBC: 3.23 MIL/uL — ABNORMAL LOW (ref 3.87–5.11)
RDW: 12.8 % (ref 11.5–15.5)
WBC: 5.7 10*3/uL (ref 4.0–10.5)
nRBC: 0.4 % — ABNORMAL HIGH (ref 0.0–0.2)

## 2019-04-25 LAB — COMPREHENSIVE METABOLIC PANEL
ALT: 12 U/L (ref 0–44)
AST: 17 U/L (ref 15–41)
Albumin: 3 g/dL — ABNORMAL LOW (ref 3.5–5.0)
Alkaline Phosphatase: 47 U/L (ref 38–126)
Anion gap: 11 (ref 5–15)
BUN: <5 mg/dL — ABNORMAL LOW (ref 8–23)
CO2: 28 mmol/L (ref 22–32)
Calcium: 8.4 mg/dL — ABNORMAL LOW (ref 8.9–10.3)
Chloride: 99 mmol/L (ref 98–111)
Creatinine, Ser: 0.69 mg/dL (ref 0.44–1.00)
GFR calc Af Amer: >60 mL/min (ref >60)
GFR calc non Af Amer: >60 mL/min (ref >60)
Glucose, Bld: 89 mg/dL (ref 70–99)
Potassium: 3.6 mmol/L (ref 3.5–5.1)
Sodium: 138 mmol/L (ref 135–145)
Total Bilirubin: 2.2 mg/dL — ABNORMAL HIGH (ref 0.3–1.2)
Total Protein: 5.6 g/dL — ABNORMAL LOW (ref 6.5–8.1)

## 2019-04-25 LAB — PHOSPHORUS: Phosphorus: 2.6 mg/dL (ref 2.5–4.6)

## 2019-04-25 LAB — MAGNESIUM: Magnesium: 1.8 mg/dL (ref 1.7–2.4)

## 2019-04-25 MED ORDER — LOSARTAN POTASSIUM 50 MG PO TABS
50.0000 mg | ORAL_TABLET | Freq: Every day | ORAL | Status: DC
  Administered 2019-04-25 – 2019-04-27 (×3): 50 mg via ORAL
  Filled 2019-04-25 (×3): qty 1

## 2019-04-25 MED ORDER — LOSARTAN POTASSIUM 50 MG PO TABS: 50.0000 mg | ORAL_TABLET | Freq: Every day | ORAL | Status: DC

## 2019-04-25 NOTE — Evaluation (Signed)
Occupational Therapy Evaluation Patient Details Name: Tina Duarte MRN: [id number] DOB: 09/08/1945 Today's Date: 04/25/2019    History of Present Illness Patient is a 74 year old female admitted from home with abdominal pain. PMH includes: HTN, ashtma, depression and HLD.   Clinical Impression   PT admitted with abdominal pain. Pt currently with functional limitiations due to the deficits listed below (see OT problem list). Pt currently fatigues quickly but has multiple family assistance in family that live in two houses beside her.  Pt will benefit from skilled OT to increase their independence and safety with adls and balance to allow discharge hhot.     Follow Up Recommendations  Home health OT    Equipment Recommendations  Other (comment)(rollator)    Recommendations for Other Services       Precautions / Restrictions Precautions Precautions: Fall      Mobility Bed Mobility Overal bed mobility: Needs Assistance Bed Mobility: Supine to Sit;Sit to Supine     Supine to sit: Modified independent (Device/Increase time) Sit to supine: Min assist   General bed mobility comments: requires (A) for bil Le back on bed surface to return to supine  Transfers Overall transfer level: Needs assistance Equipment used: Rolling walker (2 wheeled) Transfers: Sit to/from Stand Sit to Stand: Min guard              Balance Overall balance assessment: Mild deficits observed, not formally tested                                         ADL either performed or assessed with clinical judgement   ADL Overall ADL's : Needs assistance/impaired Eating/Feeding: Modified independent   Grooming: Wash/dry face               Lower Body Dressing: Min guard   Toilet Transfer: Min guard             General ADL Comments: pt educated on use of reacher to complete LB dressing. pt has been opening diaper then walking it up her legs and demonstrates this during  session. pt has reacher at home and educated on using. pt said "yeah i think that would be better)     Vision Baseline Vision/History: Wears glasses Wears Glasses: Reading only       Perception     Praxis      Pertinent Vitals/Pain Pain Assessment: No/denies pain     Hand Dominance Right   Extremity/Trunk Assessment Upper Extremity Assessment Upper Extremity Assessment: Overall WFL for tasks assessed   Lower Extremity Assessment Lower Extremity Assessment: Overall WFL for tasks assessed   Cervical / Trunk Assessment Cervical / Trunk Assessment: Normal   Communication Communication Communication: No difficulties   Cognition Arousal/Alertness: Awake/alert Behavior During Therapy: WFL for tasks assessed/performed Overall Cognitive Status: Within Functional Limits for tasks assessed                                     General Comments  pt reports that she lives for "Tina Duarte and Tina Duarte her great grandchildren that live in front of her" Pt educated energy conservation and provdied hand out    Exercises     Shoulder Instructions      Home Living Family/patient expects to be discharged to:: Private residence Living Arrangements: Children Available Help  at Discharge: Available PRN/intermittently Type of Home: House Home Access: Level entry     Home Layout: One level     Bathroom Shower/Tub: Teacher, early years/pre: Standard     Home Equipment: Environmental consultant - 2 wheels;Cane - single point          Prior Functioning/Environment Level of Independence: Independent with assistive device(s)        Comments: She lives with her son, she reports she does not leave the home much unless for MD appointment. Son does shopping/cooking.. DOes not attempt a step over tub unless someone is home to be availabe to call . She has order handicap bars for tub ( pending arrival Monday 04/28/19 and wants shower seat_        OT Problem List: Decreased  strength;Decreased activity tolerance;Impaired balance (sitting and/or standing);Decreased safety awareness;Decreased knowledge of use of DME or AE;Decreased knowledge of precautions      OT Treatment/Interventions: [place]son-care/ADL training;Therapeutic exercise;Neuromuscular education;Energy conservation;DME and/or AE instruction;Manual therapy;Therapeutic activities;Balance training;Patient/family education    OT Goals(Current goals can be found in the care plan section) Acute Rehab OT Goals Patient Stated Goal: to return home to great grandchildren OT Goal Formulation: With patient Time For Goal Achievement: 05/09/19 Potential to Achieve Goals: Good  OT Frequency: Min 2X/week   Barriers to D/C:            Co-evaluation              AM-PAC OT "6 Clicks" Daily Activity     Outcome Measure Help from another person eating meals?: None Help from another person taking care of personal grooming?: A Little Help from another person toileting, which includes using toliet, bedpan, or urinal?: A Little Help from another person bathing (including washing, rinsing, drying)?: A Little Help from another person to put on and taking off regular upper body clothing?: A Little Help from another person to put on and taking off regular lower body clothing?: A Lot 6 Click Score: 18   End of Session Equipment Utilized During Treatment: Gait belt;Rolling walker Nurse Communication: Mobility status;Precautions  Activity Tolerance: Patient tolerated treatment well Patient left: in bed;with call bell/phone within reach;with bed alarm set  OT Visit Diagnosis: Unsteadiness on feet (R26.81);Muscle weakness (generalized) (M62.81)                Time: [id number] OT Time Calculation (min): 26 min Charges:  OT General Charges $OT Visit: 1 Visit OT Evaluation $OT Eval Moderate Complexity: 1 Mod   Tina Duarte, OTR/L  Acute Rehabilitation Services Pager: [phone] Office: [phone] .   Tina Duarte 04/25/2019, 4:05 PM

## 2019-04-25 NOTE — Progress Notes (Signed)
PROGRESS NOTE    Tina Duarte  [id number] DOB: 09-17-1945 DOA: 04/19/2019 PCP: Einar Pheasant, MD   Brief Narrative:  HPI per Dr. Cherylann Ratel on 04/19/19 Tina Duarte is a 74 y.o. female with medical history significant of Factor V Leiden, HTN, chronic respiratory failure on 1L [place] at home. Was seen at [facility] for abdominal pain. She is unable to tell me the full story as she is intermittently confused. However, chart review shows that she had about 4 days of abdominal pain that was achy and constant. She's had poor appetite during this time. She denied any bowel habit changes at the time. She denies any urinary changes at the time. She denies any other alleviating or aggravating factors. She was evaluated by their ED and found to have a retroperitoneal hematoma. She was also found to have an INR of 4.8. A CTA was obtained that revealed the possibility of extravasation. She was evaluated by vascular surgery who suggested coagulation reversal and transfer to [facility] for possible IR intervention w/ angiography and embolization.   **Interim History  She did not actually undergo IR intervention with angiography and embolization given that her coagulopathy is reversed.  Hemoglobin/hematocrit is been relatively stable for the last few days.  Patient still complain of some abdominal pain and diet is being advanced slowly. Will advance to SOFT diet now. PT/OT recommending Home Health anticipate Discharging home in the AM   Assessment & Plan:   Principal Problem:   Retroperitoneal hematoma Active Problems:   Essential hypertension, benign   Hypokalemia   Abdominal pain   Supratherapeutic INR   Factor V Leiden (HCC)  Retroperitoneal hematoma Abdominal pain, improving Supratherapeutic INR -CT Angio Abd/Pelvis showed "Active extravasation into right retroperitoneal hematoma, which has slightly enlarged since earlier scan. No definite pseudoaneurysm or etiology of bleeding identified." -RUQ showed  "There is a 9.3 x 7.2 x 5.2 cm rounded masslike collection with a fluid fluid level inferior to the liver and gallbladder. This masslike collection does not definitely arise from the aorta or right kidney. No internal blood flow. No correlate for this masslike collection was seen on the CT scan from January 2015. Recommend a CT scan of the abdomen and pelvis with intravenous and oral contrast. The gallbladder is distended but otherwise unremarkable. No stones, wall thickening, pericholecystic fluid, or Murphy's sign." -S/p 2 units FFP and vit K at [facility] -if INR did not stabilize and she becomes unstable, will need to get IR involved and get mesenteric angiography and embolization; Dr. Marylyn Ishihara spoke with IR (Dr. Laurence Ferrari), they are aware of the patient -Admitted to inpt, med-tele; expect greater than 2MN stay -Continue dto trend H&H q6h but will stop now that is relatively stable, transfuse as necessary -On 3/22:  Dr. Marylyn Ishihara spoke with IR about repeat imaging, hold for now; Hgb and INR are stable; she is improving. Start diet. -On 3/23: still having ab pain. Have added more consistent PRNs and BM regimen; Hgb is stable -Hgb/Hct is relatively stable at 9.0/29.1; Will go from FULL to SOFT and continue to Monitor tolerance  -We will need to discuss with interventional radiology as well as oncology prior to discharge; -I spoke with Dr. Maylon Peppers of Hematology who recommends not starting any anticoagulation for least 4 to 8 weeks if it is even necessary.  Patient has had a history of DVT in the past and Dr. Vertell Novak recommends finding out she has a homozygous or heterozygous factor V Leiden deficiency: He recommends outpatient follow-up with  hematology at [facility] and recommends early ambulation and SCDs for the patient currently -Likely needs Imaging in the outpatient setting in 4-6 weeks  -Continued IV fluid hydration with normal saline at 75 mL's per hour but now stopped  N/V, improving -C/w  zofran,phenergan,fluids -Initially was NPO but was started on a clear liquid diet and still continued to have some abdominal pain; no longer nauseous or vomiting and diet was advanced to full and will go to soft -Continued with IV fluid hydration with normal saline at 75 mL's per hour will now be stopped -States her Abdominal Pain has improved and she is feeling better   Hypokalemia Hypomagnesemia -Improving -Patient's potassium is 3.6 today and magnesium is 1.8 -Continue to monitor and replete as necessary -Repeat CMP and Magnesium in the a.m.  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 -She was initially subtherapeutic but now has a therapeutic INR with 1.2 -On 3/22: Held coumadin for now as the risks of restarting bleed into hematoma are too high -3/23: INR is holding stable, risks of restarting bleed are high with resuming coumadin;  -Discussed with heme-onc and they been holding all in any anticoagulation for at least 4 to 8 weeks and reevaluating if she actually needs anticoagulation to see if she is got a homozygous or heterozygous factor V Leiden deficiency  HTN -Has had N/V right now so has had inconsistent po intake for home meds -Has PRN Hydralazine 10 mg IV q8hprn for SBP >170 -Elevations in BP likely related to ab pain but now due to her missed doses -Will resume BP medications with Losartan 50 mg  -Last blood pressure was 165/121  Obesity -Estimated body mass index is 39.94 kg/m as calculated from the following:   Height as of this encounter: 5\' 5"  (1.651 m).   Weight as of this encounter: 108.9 kg. -Dietary and Weight Loss Counseling given   Hyperbilirubinemia -Has been fluctuating and yesterday it was 2.6 and it is improved to 2.2 and improved  -Continue monitor and Trend -Repeat CMP in a.m. and RUQ U/S done earlier -If continues to worsen may need GI involvement but will hold off  RLQ, improved -Patient was complaining of some right lower  quadrant and hip pain so a DG hip was obtained and showed "Frontal pelvis as well as frontal and lateral right hip images obtained. Bones are osteoporotic. No fracture or dislocation. There is mild symmetric narrowing of each hip joint. No evident erosive change. Sacroiliac joints bilaterally appear unremarkable."  DVT prophylaxis: SCDs Code Status: FULL CODE  Family Communication: No family present at bedside  Disposition Plan: Remain Inpatient until able to tolerate po and ensure that her Hb/Hct is not dropping further  Consultants:   Vascular Surgery   Case was Discussed with IR    Procedures: None   Antimicrobials:  Anti-infectives (From admission, onward)   None     Subjective: Seen and examined at bedside states that she is feeling better after having a bowel movement.  Denies any lightheadedness or dizziness.  No nausea or vomiting.  Attempting to eat her soft diet today.  No other concerns or complaints at this time.  Objective: Vitals:   04/25/19 0200 04/25/19 0400 04/25/19 0921 04/25/19 1200  BP:  128/67 (!) 165/121   Pulse:  67 78 100  Resp:  14 18   Temp:  99 F (37.2 C) 98 F (36.7 C)   TempSrc:  Oral Oral   SpO2: 94% 96% 92%   Weight:  Height:        Intake/Output Summary (Last 24 hours) at 04/25/2019 1926 Last data filed at 04/25/2019 [id number] Gross per 24 hour  Intake 3 ml  Output -  Net 3 ml   Filed Weights   04/19/19 1727  Weight: 108.9 kg   Examination: Physical Exam:  Constitutional: WN/WD Caucasian female currently in no acute distress appears calm and appears more comfortable today than she did yesterday Eyes: Lids and conjunctivae normal, sclerae anicteric  ENMT: External Ears, Nose appear normal. Grossly normal hearing.   Neck: Appears normal, supple, no cervical masses, normal ROM, no appreciable thyromegaly; no JVD Respiratory: Diminished to auscultation bilaterally, no wheezing, rales, rhonchi or crackles. Normal respiratory effort  and patient is not tachypenic. No accessory muscle use.  Unlabored breathing Cardiovascular: RRR, no murmurs / rubs / gallops. S1 and S2 auscultated. No carotid bruits.  Abdomen: Soft, non-tender, Distended 2/2 body habitus. No masses palpated. No appreciable hepatosplenomegaly. Bowel sounds positive.  GU: Deferred. Musculoskeletal: No clubbing / cyanosis of digits/nails. No joint deformity upper and lower extremities.  Skin: No rashes, lesions, ulcers. No induration; Warm and dry.  Neurologic: CN 2-12 grossly intact with no focal deficits. Romberg sign and cerebellar reflexes not assessed.  Psychiatric: Normal judgment and insight. Alert and oriented x 3. Pleasant mood and appropriate affect.   Data Reviewed: I have personally reviewed following labs and imaging studies  CBC: Recent Labs  Lab 04/21/19 0402 04/21/19 0919 04/22/19 0311 04/22/19 1029 04/23/19 0343 04/23/19 0935 04/23/19 2226 04/24/19 0354 04/24/19 0942 04/24/19 1529 04/25/19 1125  WBC 7.3  --  6.8  --  5.7  --   --   --  5.7  --  5.7  NEUTROABS 4.8  --  4.0  --  3.3  --   --   --  3.6  --  3.4  HGB 10.0*   < > 9.2*   < > 8.8*   < > 9.1* 8.8* 9.2* 9.4* 9.0*  HCT 31.7*   < > 29.0*   < > 27.9*   < > 28.8* 28.1* 28.8* 29.1* 29.1*  MCV 89.5  --  90.9  --  91.2  --   --   --  90.3  --  90.1  PLT 199  --  195  --  220  --   --   --  251  --  288   < > = values in this interval not displayed.   Basic Metabolic Panel: Recent Labs  Lab 04/21/19 0402 04/22/19 0311 04/23/19 0343 04/24/19 0942 04/25/19 1125  NA 139 136 137 139 138  K 3.1* 3.7 3.5 4.3 3.6  CL 100 101 100 102 99  CO2 27 29 28 30 28   GLUCOSE 97 99 83 92 89  BUN 9 8 5* 6* <5*  CREATININE 0.82 0.81 0.70 0.76 0.69  CALCIUM 8.2* 7.9* 7.9* 8.3* 8.4*  MG 2.0 1.8 1.8 1.9 1.8  PHOS  --   --  2.6 2.5 2.6   GFR: Estimated Creatinine Clearance: 74.6 mL/min (by C-G formula based on SCr of 0.69 mg/dL). Liver Function Tests: Recent Labs  Lab 04/21/19 0402  04/21/19 0402 04/22/19 0311 04/23/19 0343 04/23/19 0935 04/24/19 0942 04/25/19 1125  AST 21  --  19  --  19 23 17   ALT 13  --  13  --  12 13 12   ALKPHOS 47  --  41  --  44 48 47  BILITOT 2.0*  --  1.7*  --  2.0* 2.6* 2.2*  PROT 5.9*  --  5.7*  --  5.5* 5.7* 5.6*  ALBUMIN 3.1*   < > 2.8* 2.6* 2.7* 2.9* 3.0*   < > = values in this interval not displayed.   Recent Labs  Lab 04/19/19 0049 04/20/19 1037  LIPASE 23 21  AMYLASE  --  34   No results for input(s): AMMONIA in the last 168 hours. Coagulation Profile: Recent Labs  Lab 04/19/19 1846 04/20/19 0433 04/21/19 0402 04/22/19 1159 04/23/19 0343  INR 1.2 1.2 1.2 1.2 1.2   Cardiac Enzymes: Recent Labs  Lab 04/21/19 1554  CKTOTAL 473*   BNP (last 3 results) No results for input(s): PROBNP in the last 8760 hours. HbA1C: No results for input(s): HGBA1C in the last 72 hours. CBG: No results for input(s): GLUCAP in the last 168 hours. Lipid Profile: No results for input(s): CHOL, HDL, LDLCALC, TRIG, CHOLHDL, LDLDIRECT in the last 72 hours. Thyroid Function Tests: No results for input(s): TSH, T4TOTAL, FREET4, T3FREE, THYROIDAB in the last 72 hours. Anemia Panel: No results for input(s): VITAMINB12, FOLATE, FERRITIN, TIBC, IRON, RETICCTPCT in the last 72 hours. Sepsis Labs: No results for input(s): PROCALCITON, LATICACIDVEN in the last 168 hours.  Recent Results (from the past 240 hour(s))  Respiratory Panel by RT PCR (Flu A&B, Covid) - Nasopharyngeal Swab     Status: None   Collection Time: 04/19/19  7:29 AM   Specimen: Nasopharyngeal Swab  Result Value Ref Range Status   SARS Coronavirus 2 by RT PCR NEGATIVE NEGATIVE Final    Comment: (NOTE) SARS-CoV-2 target nucleic acids are NOT DETECTED. The SARS-CoV-2 RNA is generally detectable in upper respiratoy specimens during the acute phase of infection. The lowest concentration of SARS-CoV-2 viral copies this assay can detect is 131 copies/mL. A negative result does  not preclude SARS-Cov-2 infection and should not be used as the sole basis for treatment or other patient management decisions. A negative result may occur with  improper specimen collection/handling, submission of specimen other than nasopharyngeal swab, presence of viral mutation(s) within the areas targeted by this assay, and inadequate number of viral copies (<131 copies/mL). A negative result must be combined with clinical observations, patient history, and epidemiological information. The expected result is Negative. Fact Sheet for Patients:  PinkCheek.be Fact Sheet for Healthcare Providers:  GravelBags.it This test is not yet ap proved or cleared by the [place] FDA and  has been authorized for detection and/or diagnosis of SARS-CoV-2 by FDA under an Emergency Use Authorization (EUA). This EUA will remain  in effect (meaning this test can be used) for the duration of the COVID-19 declaration under Section 564(b)(1) of the Act, 21 U.S.C. section 360bbb-3(b)(1), unless the authorization is terminated or revoked sooner.    Influenza A by PCR NEGATIVE NEGATIVE Final   Influenza B by PCR NEGATIVE NEGATIVE Final    Comment: (NOTE) The Xpert Xpress SARS-CoV-2/FLU/RSV assay is intended as an aid in  the diagnosis of influenza from Nasopharyngeal swab specimens and  should not be used as a sole basis for treatment. Nasal washings and  aspirates are unacceptable for Xpert Xpress SARS-CoV-2/FLU/RSV  testing. Fact Sheet for Patients: PinkCheek.be Fact Sheet for Healthcare Providers: GravelBags.it This test is not yet approved or cleared by the [place] FDA and  has been authorized for detection and/or diagnosis of SARS-CoV-2 by  FDA under an Emergency Use Authorization (EUA). This EUA will remain  in effect (meaning this test can be  used) for the duration of the   Covid-19 declaration under Section 564(b)(1) of the Act, 21  U.S.C. section 360bbb-3(b)(1), unless the authorization is  terminated or revoked. Performed at [facility], [address] [place], [place] [address]     Radiology Studies: DG HIP UNILAT WITH PELVIS 2-3 VIEWS RIGHT  Result Date: 04/24/2019 CLINICAL DATA:  Pain EXAM: DG HIP (WITH OR WITHOUT PELVIS) 2-3V RIGHT COMPARISON:  None. FINDINGS: Frontal pelvis as well as frontal and lateral right hip images obtained. Bones are osteoporotic. No fracture or dislocation. There is mild symmetric narrowing of each hip joint. No evident erosive change. Sacroiliac joints bilaterally appear unremarkable. IMPRESSION: Mild symmetric narrowing of each hip joint. No fracture or dislocation. No erosive change. Bones osteoporotic. Electronically Signed   By: Lowella Grip III M.D.   On: 04/24/2019 11:07    Scheduled Meds: . budesonide  2 mL Nebulization BID  . polyethylene glycol  17 g Oral BID  . senna-docusate  1 tablet Oral BID  . sodium chloride flush  3 mL Intravenous Q12H   Continuous Infusions:   LOS: 6 days   Kerney Elbe, DO [facility] PAGER is on AMION  If 7PM-7AM, please contact night-coverage [email or web address]

## 2019-04-25 NOTE — Progress Notes (Signed)
PT Cancellation Note  Patient Details Name: Tina Duarte MRN: [id number] DOB: 1945-03-10   Cancelled Treatment:    Reason Eval/Treat Not Completed: Patient declined, no reason specified Pt declined participating in therapy at this time due to just getting cleaned up after BM and feeling fatigued. PT will continue to follow acutely.    Earney Navy, PTA Acute Rehabilitation Services Pager: [phone] Office: [phone]   04/25/2019, 2:55 PM

## 2019-04-26 ENCOUNTER — Inpatient Hospital Stay (HOSPITAL_COMMUNITY): Payer: Medicare Other

## 2019-04-26 DIAGNOSIS — J9601 Acute respiratory failure with hypoxia: Secondary | ICD-10-CM

## 2019-04-26 LAB — COMPREHENSIVE METABOLIC PANEL
ALT: 13 U/L (ref 0–44)
AST: 18 U/L (ref 15–41)
Albumin: 3.1 g/dL — ABNORMAL LOW (ref 3.5–5.0)
Alkaline Phosphatase: 54 U/L (ref 38–126)
Anion gap: 11 (ref 5–15)
BUN: <5 mg/dL — ABNORMAL LOW (ref 8–23)
CO2: 30 mmol/L (ref 22–32)
Calcium: 8.7 mg/dL — ABNORMAL LOW (ref 8.9–10.3)
Chloride: 98 mmol/L (ref 98–111)
Creatinine, Ser: 0.74 mg/dL (ref 0.44–1.00)
GFR calc Af Amer: >60 mL/min (ref >60)
GFR calc non Af Amer: >60 mL/min (ref >60)
Glucose, Bld: 107 mg/dL — ABNORMAL HIGH (ref 70–99)
Potassium: 3.7 mmol/L (ref 3.5–5.1)
Sodium: 139 mmol/L (ref 135–145)
Total Bilirubin: 1.7 mg/dL — ABNORMAL HIGH (ref 0.3–1.2)
Total Protein: 6.4 g/dL — ABNORMAL LOW (ref 6.5–8.1)

## 2019-04-26 LAB — CBC WITH DIFFERENTIAL/PLATELET
Abs Immature Granulocytes: 0.06 10*3/uL (ref 0.00–0.07)
Basophils Absolute: 0 10*3/uL (ref 0.0–0.1)
Basophils Relative: 1 %
Eosinophils Absolute: 0.3 10*3/uL (ref 0.0–0.5)
Eosinophils Relative: 5 %
HCT: 32.2 % — ABNORMAL LOW (ref 36.0–46.0)
Hemoglobin: 10.2 g/dL — ABNORMAL LOW (ref 12.0–15.0)
Immature Granulocytes: 1 %
Lymphocytes Relative: 32 %
Lymphs Abs: 2.3 10*3/uL (ref 0.7–4.0)
MCH: 28.4 pg (ref 26.0–34.0)
MCHC: 31.7 g/dL (ref 30.0–36.0)
MCV: 89.7 fL (ref 80.0–100.0)
Monocytes Absolute: 0.6 10*3/uL (ref 0.1–1.0)
Monocytes Relative: 8 %
Neutro Abs: 3.8 10*3/uL (ref 1.7–7.7)
Neutrophils Relative %: 53 %
Platelets: 342 10*3/uL (ref 150–400)
RBC: 3.59 MIL/uL — ABNORMAL LOW (ref 3.87–5.11)
RDW: 12.9 % (ref 11.5–15.5)
WBC: 7.1 10*3/uL (ref 4.0–10.5)
nRBC: 0.4 % — ABNORMAL HIGH (ref 0.0–0.2)

## 2019-04-26 LAB — MAGNESIUM: Magnesium: 1.9 mg/dL (ref 1.7–2.4)

## 2019-04-26 LAB — PHOSPHORUS: Phosphorus: 2.8 mg/dL (ref 2.5–4.6)

## 2019-04-26 MED ORDER — LEVALBUTEROL HCL 0.63 MG/3ML IN NEBU
0.6300 mg | INHALATION_SOLUTION | Freq: Three times a day (TID) | RESPIRATORY_TRACT | Status: DC
  Administered 2019-04-26 – 2019-04-27 (×2): 0.63 mg via RESPIRATORY_TRACT
  Filled 2019-04-26 (×2): qty 3

## 2019-04-26 MED ORDER — FUROSEMIDE 10 MG/ML IJ SOLN
40.0000 mg | Freq: Once | INTRAMUSCULAR | Status: AC
  Administered 2019-04-26: 40 mg via INTRAVENOUS
  Filled 2019-04-26: qty 4

## 2019-04-26 MED ORDER — IPRATROPIUM BROMIDE 0.02 % IN SOLN
0.5000 mg | Freq: Three times a day (TID) | RESPIRATORY_TRACT | Status: DC
  Administered 2019-04-26 – 2019-04-27 (×2): 0.5 mg via RESPIRATORY_TRACT
  Filled 2019-04-26 (×2): qty 2.5

## 2019-04-26 MED ORDER — IOHEXOL 300 MG/ML  SOLN
100.0000 mL | Freq: Once | INTRAMUSCULAR | Status: AC | PRN
  Administered 2019-04-26: 100 mL via INTRAVENOUS

## 2019-04-26 MED ORDER — LEVALBUTEROL HCL 0.63 MG/3ML IN NEBU: 0.6300 mg | INHALATION_SOLUTION | Freq: Three times a day (TID) | RESPIRATORY_TRACT | Status: DC

## 2019-04-26 MED ORDER — IOHEXOL 9 MG/ML PO SOLN: 500.0000 mL | ORAL | Status: AC

## 2019-04-26 MED ORDER — GUAIFENESIN ER 600 MG PO TB12
1200.0000 mg | ORAL_TABLET | Freq: Two times a day (BID) | ORAL | Status: DC
  Administered 2019-04-26 – 2019-04-27 (×2): 1200 mg via ORAL
  Filled 2019-04-26 (×2): qty 2

## 2019-04-26 NOTE — Progress Notes (Signed)
PROGRESS NOTE    Tina Duarte  [id number] DOB: July 24, 1945 DOA: 04/19/2019 PCP: Einar Pheasant, MD   Brief Narrative:  HPI per Dr. Cherylann Ratel on 04/19/19 Tina Duarte is a 74 y.o. female with medical history significant of Factor V Leiden, HTN, chronic respiratory failure on 1L [place] at home. Was seen at [facility] for abdominal pain. She is unable to tell me the full story as she is intermittently confused. However, chart review shows that she had about 4 days of abdominal pain that was achy and constant. She's had poor appetite during this time. She denied any bowel habit changes at the time. She denies any urinary changes at the time. She denies any other alleviating or aggravating factors. She was evaluated by their ED and found to have a retroperitoneal hematoma. She was also found to have an INR of 4.8. A CTA was obtained that revealed the possibility of extravasation. She was evaluated by vascular surgery who suggested coagulation reversal and transfer to [facility] for possible IR intervention w/ angiography and embolization.   **Interim History  She did not actually undergo IR intervention with angiography and embolization given that her coagulopathy is reversed.  Hemoglobin/hematocrit is been relatively stable for the last few days.  Patient still complain of some abdominal pain and diet is being advanced slowly. Will advance to SOFT diet now. PT/OT recommending Home Health.  We are anticipating discharging the patient home today however she became acutely hypoxic and had low saturations in the low to mid 80s on room air.  A chest x-ray was obtained.  Patient also complained of significant abdominal pain again so we will repeat her CT scan of the abdomen pelvis today.  Chest x-ray showed minimal right-sided pleural effusion and CT of the abdomen pelvis showed "Slight interval enlargement of the retroperitoneal hematoma along the dorsal aspect of the duodenal sweep. No evidence of contrast extravasation.  Likely vicarious excretion of contrast within the gallbladder. No evidence of cholelithiasis or cholecystitis"  Assessment & Plan:   Principal Problem:   Retroperitoneal hematoma Active Problems:   Essential hypertension, benign   Hypokalemia   Abdominal pain   Supratherapeutic INR   Factor V Leiden (HCC)  Retroperitoneal hematoma, slightly enlarged Abdominal pain, improving Supratherapeutic INR -CT Angio Abd/Pelvis showed "Active extravasation into right retroperitoneal hematoma, which has slightly enlarged since earlier scan. No definite pseudoaneurysm or etiology of bleeding identified." -RUQ U/S showed "There is a 9.3 x 7.2 x 5.2 cm rounded masslike collection with a fluid fluid level inferior to the liver and gallbladder. This masslike collection does not definitely arise from the aorta or right kidney. No internal blood flow. No correlate for this masslike collection was seen on the CT scan from January 2015. Recommend a CT scan of the abdomen and pelvis with intravenous and oral contrast. The gallbladder is distended but otherwise unremarkable. No stones, wall thickening, pericholecystic fluid, or Murphy's sign." -Patient underwent a repeat CT scan of the abdomen pelvis today given that she continued to have some abdominal pain which was worse than yesterday and showed "Slight interval enlargement of the retroperitoneal hematoma along the dorsal aspect of the duodenal sweep. No evidence of contrast extravasation. Likely vicarious excretion of contrast within the gallbladder. No evidence of cholelithiasis or cholecystitis." -S/p 2 units FFP and vit K at [place] -if INR did not stabilize and she becomes unstable, will need to get IR involved and get mesenteric angiography and embolization; Dr. Marylyn Ishihara spoke with IR (Dr. Laurence Ferrari), they  are aware of the patient -Admitted to inpt, med-tele; expect greater than 2MN stay -Continue dto trend H&H q6h but will stop now that is relatively  stable, transfuse as necessary -On 3/22:  Dr. Marylyn Ishihara spoke with IR about repeat imaging, hold for now; Hgb and INR are stable; she is improving. Start diet. -On 3/23: still having ab pain. Have added more consistent PRNs and BM regimen; Hgb is stable -Hgb/Hct is relatively stable at 9.0/29.1; Will go from FULL to SOFT and continue to Monitor tolerance  -We will need to discuss with interventional radiology as well as oncology prior to discharge; -I spoke with Dr. Maylon Peppers of Hematology who recommends not starting any anticoagulation for least 4 to 8 weeks if it is even necessary.  Patient has had a history of DVT in the past and Dr. Vertell Novak recommends finding out she has a homozygous or heterozygous factor V Leiden deficiency: He recommends outpatient follow-up with hematology at [facility] and recommends early ambulation and SCDs for the patient currently -Likely needs Imaging i again n the outpatient setting in 4-6 weeks  -Continued IV fluid hydration with normal saline at 75 mL's per hour but now stopped -Patient had worsened abdominal pain so repeat CT scan as above -Continue monitor and continue with pain control  N/V, improved -C/w zofran,phenergan,fluids -Initially was NPO but was started on a clear liquid diet and still continued to have some abdominal pain; no longer nauseous or vomiting and diet was advanced to full and will go to soft -Continued with IV fluid hydration with normal saline at 75 mL's per hour will now be stopped -States her Abdominal Pain has improved and she is feeling better yesterday but had pain again today so repeat CT scan was done  Hypokalemia Hypomagnesemia -Improving -Patient's potassium is 3.7 today and magnesium is 1.9 -Continue to monitor and replete as necessary -Repeat CMP and Magnesium in the a.m.  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 -She was initially subtherapeutic but now has a therapeutic INR with 1.2 -On 3/22: Held coumadin for now  as the risks of restarting bleed into hematoma are too high -3/23: INR is holding stable, risks of restarting bleed are high with resuming coumadin;  -Discussed with heme-onc and they been holding all in any anticoagulation for at least 4 to 8 weeks and reevaluating if she actually needs anticoagulation to see if she is got a homozygous or heterozygous factor V Leiden deficiency  HTN -Has had N/V right now so has had inconsistent po intake for home meds -Has PRN Hydralazine 10 mg IV q8hprn for SBP >170 -Elevations in BP likely related to ab pain but now due to her missed doses -Will resume BP medications with Losartan 50 mg  -Last blood pressure was 145/70  Obesity -Estimated body mass index is 39.94 kg/m as calculated from the following:   Height as of this encounter: 5\' 5"  (1.651 m).   Weight as of this encounter: 108.9 kg. -Dietary and Weight Loss Counseling given   Hyperbilirubinemia -Has been fluctuating and yesterday it was 2.6 and it is improved to 2.2 and improved and today it is 1.7 -Continue monitor and Trend -Repeat CMP in a.m. and RUQ U/S done earlier -If continues to worsen may need GI involvement but will hold off  RLQ, improved initially but had some more pain -Patient was complaining of some right lower quadrant and hip pain so a DG hip was obtained and showed "Frontal pelvis as well as frontal and  lateral right hip images obtained. Bones are osteoporotic. No fracture or dislocation. There is mild symmetric narrowing of each hip joint. No evident erosive change. Sacroiliac joints bilaterally appear unremarkable." -Next CT of the abdomen pelvis and showed "There is a persistent retroperitoneal hematoma along the posterior wall of the duodenal sweep. Hematoma now measures approximately 5.8 x 12.5 cm in transverse dimension, and extends approximately 10.5 cm in craniocaudal length. Overall, minimal interval enlargement. No evidence of active contrast extravasation on this  study. Trace fluid extends in the retroperitoneum inferiorly along the iliac vessels. No new areas of hemorrhage. No free intraperitoneal fluid or free gas." -CT Scan also showed "Likely vicarious excretion of contrast within the gallbladder. No evidence of cholelithiasis or cholecystitis."  Acute Respiratory Failure with Hypoxia -Patient was saturating in the mid to low 80s on room air and states that she had a hard time taking a deep breath in -Chest x-ray showed "The aorta is tortuous. Heart size is enlarged. Minimal pleural effusion is identified in the right hemithorax. The left lung is clear. Bony structures are stable." -We will add some breathing treatments and give a one-time dose of IV Lasix of 40 mg Is now on scheduled Xopenex/Atrovent every 8 hours and I also added flutter valve, guaifenesin and incentive spirometry -Continue to have 2.5 mg nebs of albuterol every 6 as needed for wheezing or shortness of breath -Continue with budesonide 0.5 mg nebs twice daily scheduled -Continuous pulse oximetry and maintain O2 saturations greater than 90% -Continue supplemental oxygen via nasal cannula and wean O2 as tolerated -Repeat chest x-ray in the a.m. and will also order for an ambulatory home O2 screen  DVT prophylaxis: SCDs Code Status: FULL CODE  Family Communication: No family present at bedside  Disposition Plan: Remain Inpatient until able to tolerate po and ensure that her Hb/Hct is not dropping further  Consultants:   Vascular Surgery   Case was Discussed with IR    Procedures: None   Antimicrobials:  Anti-infectives (From admission, onward)   None     Subjective: Seen and examined at bedside states that she is hurting today and had more abdominal pain than she did yesterday.  States she had a bowel movement that helped her abdomen but continues to have significant pain.  Also states that she is more short of breath today and had a hard time taking a deep breath.  She  was placed back on pulse oximetry and she is desaturating to the mid to low 80s so she was placed on oxygen and a chest x-ray obtained.  CT of the abdomen pelvis was also obtained given her increased abdominal pain.  She denies any other complaints or concerns at this time and states that she has been ambulating.  Objective: Vitals:   04/26/19 0319 04/26/19 0721 04/26/19 0924 04/26/19 1652  BP: (!) 160/89  (!) 117/93 (!) 145/70  Pulse: 97  72   Resp: 14  11 20   Temp: 97.8 F (36.6 C)  98.4 F (36.9 C) 98 F (36.7 C)  TempSrc: Oral  Oral   SpO2: 95% 93% 91% 92%  Weight:      Height:       No intake or output data in the 24 hours ending 04/26/19 1726 Filed Weights   04/19/19 1727  Weight: 108.9 kg   Examination: Physical Exam:  Constitutional: WN/WD Caucasian female currently no acute distress appears little bit more uncomfortable today and had a little bit more labored breathing Eyes: Lids  and conjunctivae normal, sclerae anicteric  ENMT: External Ears, Nose appear normal. Grossly normal hearing. Neck: Appears normal, supple, no cervical masses, normal ROM, no appreciable thyromegaly: No JVD Respiratory: Diminished to auscultation bilaterally with slightly coarse breath sounds more so on the right compared to the left but no appreciable no wheezing, rales, rhonchi .  Slightly increased respiratory effort but not using any accessory muscle use.  She is desaturating without using any supplemental oxygen to the mid to low 80s Cardiovascular: RRR, no murmurs / rubs / gallops. S1 and S2 auscultated.  Mild 1+ lower extremity edema Abdomen: Soft, tender to palpate, distended secondary body habitus. Bowel sounds positive.  GU: Deferred. Musculoskeletal: No clubbing / cyanosis of digits/nails. No joint deformity upper and lower extremities.  Skin: No rashes, lesions, ulcers on limited skin evaluation. No induration; Warm and dry.  Neurologic: CN 2-12 grossly intact with no focal deficits.  Romberg sign and cerebellar reflexes not assessed.  Psychiatric: Normal judgment and insight. Alert and oriented x 3. Appears a little anxious mood and appropriate affect.   Data Reviewed: I have personally reviewed following labs and imaging studies  CBC: Recent Labs  Lab 04/22/19 0311 04/22/19 1029 04/23/19 0343 04/23/19 0935 04/24/19 0354 04/24/19 0942 04/24/19 1529 04/25/19 1125 04/26/19 0326  WBC 6.8  --  5.7  --   --  5.7  --  5.7 7.1  NEUTROABS 4.0  --  3.3  --   --  3.6  --  3.4 3.8  HGB 9.2*   < > 8.8*   < > 8.8* 9.2* 9.4* 9.0* 10.2*  HCT 29.0*   < > 27.9*   < > 28.1* 28.8* 29.1* 29.1* 32.2*  MCV 90.9  --  91.2  --   --  90.3  --  90.1 89.7  PLT 195  --  220  --   --  251  --  288 342   < > = values in this interval not displayed.   Basic Metabolic Panel: Recent Labs  Lab 04/22/19 0311 04/23/19 0343 04/24/19 0942 04/25/19 1125 04/26/19 0326  NA 136 137 139 138 139  K 3.7 3.5 4.3 3.6 3.7  CL 101 100 102 99 98  CO2 29 28 30 28 30   GLUCOSE 99 83 92 89 107*  BUN 8 5* 6* <5* <5*  CREATININE 0.81 0.70 0.76 0.69 0.74  CALCIUM 7.9* 7.9* 8.3* 8.4* 8.7*  MG 1.8 1.8 1.9 1.8 1.9  PHOS  --  2.6 2.5 2.6 2.8   GFR: Estimated Creatinine Clearance: 74.6 mL/min (by C-G formula based on SCr of 0.74 mg/dL). Liver Function Tests: Recent Labs  Lab 04/22/19 0311 04/22/19 0311 04/23/19 0343 04/23/19 0935 04/24/19 0942 04/25/19 1125 04/26/19 0326  AST 19  --   --  19 23 17 18   ALT 13  --   --  12 13 12 13   ALKPHOS 41  --   --  44 48 47 54  BILITOT 1.7*  --   --  2.0* 2.6* 2.2* 1.7*  PROT 5.7*  --   --  5.5* 5.7* 5.6* 6.4*  ALBUMIN 2.8*   < > 2.6* 2.7* 2.9* 3.0* 3.1*   < > = values in this interval not displayed.   Recent Labs  Lab 04/20/19 1037  LIPASE 21  AMYLASE 34   No results for input(s): AMMONIA in the last 168 hours. Coagulation Profile: Recent Labs  Lab 04/19/19 1846 04/20/19 [id number] 04/21/19 0402 04/22/19 1159 04/23/19 0343  INR 1.2 1.2 1.2 1.2 1.2    Cardiac Enzymes: Recent Labs  Lab 04/21/19 1554  CKTOTAL 473*   BNP (last 3 results) No results for input(s): PROBNP in the last 8760 hours. HbA1C: No results for input(s): HGBA1C in the last 72 hours. CBG: No results for input(s): GLUCAP in the last 168 hours. Lipid Profile: No results for input(s): CHOL, HDL, LDLCALC, TRIG, CHOLHDL, LDLDIRECT in the last 72 hours. Thyroid Function Tests: No results for input(s): TSH, T4TOTAL, FREET4, T3FREE, THYROIDAB in the last 72 hours. Anemia Panel: No results for input(s): VITAMINB12, FOLATE, FERRITIN, TIBC, IRON, RETICCTPCT in the last 72 hours. Sepsis Labs: No results for input(s): PROCALCITON, LATICACIDVEN in the last 168 hours.  Recent Results (from the past 240 hour(s))  Respiratory Panel by RT PCR (Flu A&B, Covid) - Nasopharyngeal Swab     Status: None   Collection Time: 04/19/19  7:29 AM   Specimen: Nasopharyngeal Swab  Result Value Ref Range Status   SARS Coronavirus 2 by RT PCR NEGATIVE NEGATIVE Final    Comment: (NOTE) SARS-CoV-2 target nucleic acids are NOT DETECTED. The SARS-CoV-2 RNA is generally detectable in upper respiratoy specimens during the acute phase of infection. The lowest concentration of SARS-CoV-2 viral copies this assay can detect is 131 copies/mL. A negative result does not preclude SARS-Cov-2 infection and should not be used as the sole basis for treatment or other patient management decisions. A negative result may occur with  improper specimen collection/handling, submission of specimen other than nasopharyngeal swab, presence of viral mutation(s) within the areas targeted by this assay, and inadequate number of viral copies (<131 copies/mL). A negative result must be combined with clinical observations, patient history, and epidemiological information. The expected result is Negative. Fact Sheet for Patients:  PinkCheek.be Fact Sheet for Healthcare Providers:   GravelBags.it This test is not yet ap proved or cleared by the [place] FDA and  has been authorized for detection and/or diagnosis of SARS-CoV-2 by FDA under an Emergency Use Authorization (EUA). This EUA will remain  in effect (meaning this test can be used) for the duration of the COVID-19 declaration under Section 564(b)(1) of the Act, 21 U.S.C. section 360bbb-3(b)(1), unless the authorization is terminated or revoked sooner.    Influenza A by PCR NEGATIVE NEGATIVE Final   Influenza B by PCR NEGATIVE NEGATIVE Final    Comment: (NOTE) The Xpert Xpress SARS-CoV-2/FLU/RSV assay is intended as an aid in  the diagnosis of influenza from Nasopharyngeal swab specimens and  should not be used as a sole basis for treatment. Nasal washings and  aspirates are unacceptable for Xpert Xpress SARS-CoV-2/FLU/RSV  testing. Fact Sheet for Patients: PinkCheek.be Fact Sheet for Healthcare Providers: GravelBags.it This test is not yet approved or cleared by the [place] FDA and  has been authorized for detection and/or diagnosis of SARS-CoV-2 by  FDA under an Emergency Use Authorization (EUA). This EUA will remain  in effect (meaning this test can be used) for the duration of the  Covid-19 declaration under Section 564(b)(1) of the Act, 21  U.S.C. section 360bbb-3(b)(1), unless the authorization is  terminated or revoked. Performed at [facility], [address] [place], [place] [address]     Radiology Studies: CT ABDOMEN PELVIS W CONTRAST  Result Date: 04/26/2019 CLINICAL DATA:  Retroperitoneal hematoma, abdominal pain, improving supratherapeutic INR EXAM: CT ABDOMEN AND PELVIS WITH CONTRAST TECHNIQUE: Multidetector CT imaging of the abdomen and pelvis was performed using the standard protocol following bolus administration of  intravenous contrast. CONTRAST:  149mL OMNIPAQUE IOHEXOL 300  MG/ML  SOLN COMPARISON:  04/19/2019 FINDINGS: Lower chest: No acute pleural or parenchymal lung disease. Hepatobiliary: High density material within the gallbladder likely vicarious excretion of contrast versus gallbladder sludge. This is new since prior study. The liver is unremarkable. Pancreas: Unremarkable. No pancreatic ductal dilatation or surrounding inflammatory changes. Spleen: Normal in size without focal abnormality. Adrenals/Urinary Tract: Bilateral renal cortical thinning. No urinary tract calculi or obstruction. The bladder is unremarkable. The adrenals are normal. Stomach/Bowel: No bowel obstruction or ileus. Normal appendix right lower quadrant. No bowel wall thickening or inflammatory changes. Vascular/Lymphatic: Extensive atherosclerosis of the abdominal aorta unchanged. No pathologic adenopathy. Reproductive: Uterus and bilateral adnexa are unremarkable. Other: There is a persistent retroperitoneal hematoma along the posterior wall of the duodenal sweep. Hematoma now measures approximately 5.8 x 12.5 cm in transverse dimension, and extends approximately 10.5 cm in craniocaudal length. Overall, minimal interval enlargement. No evidence of active contrast extravasation on this study. Trace fluid extends in the retroperitoneum inferiorly along the iliac vessels. No new areas of hemorrhage. No free intraperitoneal fluid or free gas. Musculoskeletal: No acute or destructive bony lesions. Reconstructed images demonstrate no additional findings. IMPRESSION: 1. Slight interval enlargement of the retroperitoneal hematoma along the dorsal aspect of the duodenal sweep. No evidence of contrast extravasation. 2. Likely vicarious excretion of contrast within the gallbladder. No evidence of cholelithiasis or cholecystitis. Electronically Signed   By: Randa Ngo M.D.   On: 04/26/2019 16:45   DG CHEST PORT 1 VIEW  Result Date: 04/26/2019 CLINICAL DATA:  Hypoxia. EXAM: PORTABLE CHEST 1 VIEW COMPARISON:   April 19, 2019 FINDINGS: The aorta is tortuous. Heart size is enlarged. Minimal pleural effusion is identified in the right hemithorax. The left lung is clear. Bony structures are stable. IMPRESSION: Minimal right pleural effusion. Electronically Signed   By: Abelardo Diesel M.D.   On: 04/26/2019 13:10    Scheduled Meds: . budesonide  2 mL Nebulization BID  . losartan  50 mg Oral Q lunch  . polyethylene glycol  17 g Oral BID  . senna-docusate  1 tablet Oral BID  . sodium chloride flush  3 mL Intravenous Q12H   Continuous Infusions:   LOS: 7 days   Kerney Elbe, DO [facility] PAGER is on AMION  If 7PM-7AM, please contact night-coverage [email or web address]

## 2019-04-26 NOTE — Progress Notes (Signed)
Physical Therapy Treatment Patient Details Name: Tina Duarte MRN: [id number] DOB: 01/23/1946 Today's Date: 04/26/2019    History of Present Illness Patient is a 74 year old female admitted from home with abdominal pain. PMH includes: HTN, ashtma, depression and HLD.    PT Comments    Pt making steady progress with mobility, still needing increased encouragement to complete tasks. She was c/o feeling like she would burst, and having some abdominal pain. Pt was able to complete all tasks given but needs encouragement to push herself a little more each time. Pt needed SBA-min guard with mobility this am. Will continue to follow acutely and progress as pt tolerates.     Follow Up Recommendations  Home health PT;Supervision - Intermittent     Equipment Recommendations  None recommended by PT    Recommendations for Other Services       Precautions / Restrictions Precautions Precautions: Fall Restrictions Weight Bearing Restrictions: No    Mobility  Bed Mobility Overal bed mobility: Needs Assistance Bed Mobility: Supine to Sit;Sit to Supine     Supine to sit: Modified independent (Device/Increase time) Sit to supine: Min assist   General bed mobility comments: needs a to move BLE to bed  Transfers Overall transfer level: Needs assistance Equipment used: Rolling walker (2 wheeled) Transfers: Sit to/from [facility] Sit to Stand: Min guard Stand pivot transfers: Min guard          Ambulation/Gait Ambulation/Gait assistance: Min guard Gait Distance (Feet): 120 Feet Assistive device: Rolling walker (2 wheeled) Gait Pattern/deviations: Step-through pattern;Shuffle;Decreased step length - right;Decreased step length - left Gait velocity: decr   General Gait Details: pt decided on distance she could tolerate c/o fatigue   Stairs             Wheelchair Mobility    Modified Rankin (Stroke Patients Only)       Balance Overall balance  assessment: Mild deficits observed, not formally tested                                          Cognition Arousal/Alertness: Awake/alert Behavior During Therapy: WFL for tasks assessed/performed Overall Cognitive Status: Within Functional Limits for tasks assessed                                        Exercises      General Comments        Pertinent Vitals/Pain Pain Assessment: Faces Faces Pain Scale: Hurts little more Pain Location: abdomen, tenderness to touch Pain Descriptors / Indicators: Sore Pain Intervention(s): Limited activity within patient's tolerance;Monitored during session    Home Living                      Prior Function            PT Goals (current goals can now be found in the care plan section) Acute Rehab PT Goals Patient Stated Goal: to return home to great grandchildren PT Goal Formulation: With patient Time For Goal Achievement: 05/08/19 Potential to Achieve Goals: Good Progress towards PT goals: Progressing toward goals    Frequency    Min 3X/week      PT Plan Current plan remains appropriate    Co-evaluation  AM-PAC PT "6 Clicks" Mobility   Outcome Measure  Help needed turning from your back to your side while in a flat bed without using bedrails?: None Help needed moving from lying on your back to sitting on the side of a flat bed without using bedrails?: A Little Help needed moving to and from a bed to a chair (including a wheelchair)?: A Little Help needed standing up from a chair using your arms (e.g., wheelchair or bedside chair)?: A Little Help needed to walk in hospital room?: A Little Help needed climbing 3-5 steps with a railing? : A Little 6 Click Score: 19    End of Session Equipment Utilized During Treatment: Gait belt Activity Tolerance: Patient limited by fatigue;Patient limited by lethargy;Patient limited by pain Patient left: in bed;with call  bell/phone within reach Nurse Communication: Mobility status PT Visit Diagnosis: Difficulty in walking, not elsewhere classified (R26.2);Muscle weakness (generalized) (M62.81);History of falling (Z91.81);Pain     Time: [id number] PT Time Calculation (min) (ACUTE ONLY): 23 min  Charges:  $Gait Training: 8-22 mins $Therapeutic Activity: 8-22 mins                     Horald Chestnut, PT    Delford Field 04/26/2019, 10:58 AM

## 2019-04-27 ENCOUNTER — Inpatient Hospital Stay (HOSPITAL_COMMUNITY): Payer: Medicare Other

## 2019-04-27 DIAGNOSIS — J9621 Acute and chronic respiratory failure with hypoxia: Secondary | ICD-10-CM

## 2019-04-27 LAB — COMPREHENSIVE METABOLIC PANEL
ALT: 10 U/L (ref 0–44)
AST: 19 U/L (ref 15–41)
Albumin: 3.2 g/dL — ABNORMAL LOW (ref 3.5–5.0)
Alkaline Phosphatase: 53 U/L (ref 38–126)
Anion gap: 11 (ref 5–15)
BUN: <5 mg/dL — ABNORMAL LOW (ref 8–23)
CO2: 32 mmol/L (ref 22–32)
Calcium: 8.7 mg/dL — ABNORMAL LOW (ref 8.9–10.3)
Chloride: 96 mmol/L — ABNORMAL LOW (ref 98–111)
Creatinine, Ser: 0.92 mg/dL (ref 0.44–1.00)
GFR calc Af Amer: >60 mL/min (ref >60)
GFR calc non Af Amer: >60 mL/min (ref >60)
Glucose, Bld: 98 mg/dL (ref 70–99)
Potassium: 3.8 mmol/L (ref 3.5–5.1)
Sodium: 139 mmol/L (ref 135–145)
Total Bilirubin: 1.6 mg/dL — ABNORMAL HIGH (ref 0.3–1.2)
Total Protein: 6.2 g/dL — ABNORMAL LOW (ref 6.5–8.1)

## 2019-04-27 LAB — CBC WITH DIFFERENTIAL/PLATELET
Abs Immature Granulocytes: 0.06 10*3/uL (ref 0.00–0.07)
Basophils Absolute: 0.1 10*3/uL (ref 0.0–0.1)
Basophils Relative: 1 %
Eosinophils Absolute: 0.4 10*3/uL (ref 0.0–0.5)
Eosinophils Relative: 5 %
HCT: 33.2 % — ABNORMAL LOW (ref 36.0–46.0)
Hemoglobin: 10.2 g/dL — ABNORMAL LOW (ref 12.0–15.0)
Immature Granulocytes: 1 %
Lymphocytes Relative: 30 %
Lymphs Abs: 2.1 10*3/uL (ref 0.7–4.0)
MCH: 28.1 pg (ref 26.0–34.0)
MCHC: 30.7 g/dL (ref 30.0–36.0)
MCV: 91.5 fL (ref 80.0–100.0)
Monocytes Absolute: 0.8 10*3/uL (ref 0.1–1.0)
Monocytes Relative: 11 %
Neutro Abs: 3.6 10*3/uL (ref 1.7–7.7)
Neutrophils Relative %: 52 %
Platelets: 315 10*3/uL (ref 150–400)
RBC: 3.63 MIL/uL — ABNORMAL LOW (ref 3.87–5.11)
RDW: 13.2 % (ref 11.5–15.5)
WBC: 7 10*3/uL (ref 4.0–10.5)
nRBC: 1 % — ABNORMAL HIGH (ref 0.0–0.2)

## 2019-04-27 LAB — MAGNESIUM: Magnesium: 2 mg/dL (ref 1.7–2.4)

## 2019-04-27 LAB — PHOSPHORUS: Phosphorus: 3.5 mg/dL (ref 2.5–4.6)

## 2019-04-27 MED ORDER — SENNOSIDES-DOCUSATE SODIUM 8.6-50 MG PO TABS: 1.0000 | ORAL_TABLET | Freq: Every day | ORAL | 0 refills | Status: DC

## 2019-04-27 MED ORDER — OXYCODONE-ACETAMINOPHEN 7.5-325 MG PO TABS: 1.0000 | ORAL_TABLET | Freq: Four times a day (QID) | ORAL | 0 refills | Status: DC | PRN

## 2019-04-27 MED ORDER — IPRATROPIUM BROMIDE 0.02 % IN SOLN: 0.5000 mg | Freq: Two times a day (BID) | RESPIRATORY_TRACT | Status: DC

## 2019-04-27 MED ORDER — LEVALBUTEROL HCL 0.63 MG/3ML IN NEBU: 0.6300 mg | INHALATION_SOLUTION | Freq: Two times a day (BID) | RESPIRATORY_TRACT | Status: DC

## 2019-04-27 MED ORDER — BISACODYL 10 MG RE SUPP: 10.0000 mg | Freq: Every day | RECTAL | 0 refills | Status: DC | PRN

## 2019-04-27 MED ORDER — POLYETHYLENE GLYCOL 3350 17 G PO PACK: 17.0000 g | PACK | Freq: Two times a day (BID) | ORAL | 0 refills | Status: DC

## 2019-04-27 MED ORDER — GUAIFENESIN ER 600 MG PO TB12: 600.0000 mg | ORAL_TABLET | Freq: Two times a day (BID) | ORAL | 0 refills | Status: DC

## 2019-04-27 MED ORDER — ONDANSETRON HCL 4 MG PO TABS: 4.0000 mg | ORAL_TABLET | Freq: Four times a day (QID) | ORAL | 0 refills | Status: DC | PRN

## 2019-04-27 MED ORDER — ALBUTEROL SULFATE 0.63 MG/3ML IN NEBU: 1.0000 | INHALATION_SOLUTION | Freq: Four times a day (QID) | RESPIRATORY_TRACT | 12 refills | Status: DC | PRN

## 2019-04-27 NOTE — Progress Notes (Signed)
SATURATION QUALIFICATIONS: (This note is used to comply with regulatory documentation for home oxygen)  Patient Saturations on Room Air at Rest = 83 (or lower)%  Patient Saturations on Room Air while Ambulating = 86 (or lower)%  Patient Saturations on 2 Liters of oxygen while Ambulating = 92-94%  Please briefly explain why patient needs home oxygen: Pt desaturates to low 80's% O2 when ambulating or at rest.  Pt is currently dependent on 2 Liters O2 to maintain 92-94% O2 saturation at all times.

## 2019-04-27 NOTE — Discharge Summary (Signed)
Physician Discharge Summary  Tina Duarte [id number] DOB: 1946-01-05 DOA: 04/19/2019  PCP: Einar Pheasant, MD  Admit date: 04/19/2019 Discharge date: 04/27/2019  Admitted From: Home Disposition:  Home with Home Health   Recommendations for Outpatient Follow-up:  1. Follow up with PCP in 1-2 weeks 2. Follow up with Hematology within 1-2 weeks 3. Follow up with Pulmonary within 1-2 weeks 4. Repeat  to the abdomen pelvis to evaluate hematoma in the outpatient setting 5. Please obtain CMP/CBC, Mag, Phos in one week 6. Please follow up on the following pending results:  Home Health: YES  Equipment/Devices: None Recommended by PT/OT  Discharge Condition: Stable CODE STATUS: FULL CODE Diet recommendation: Soft Heart Healthy Diet   Brief/Interim Summary: HPI per Dr. Cherylann Ratel on 04/19/19 Tina Asal Selfis a 74 y.o.femalewith medical history significant ofFactor V Leiden, HTN, chronic respiratory failure on 1L [place] at home. Was seen at [facility] for abdominal pain. She is unable to tell me the full story as she is intermittently confused. However, chart review shows that she had about 4 days of abdominal pain that was achy and constant. She's had poor appetite during this time. She denied any bowel habit changes at the time. She denies any urinary changes at the time. She denies any other alleviating or aggravating factors. She was evaluated by their ED and found to have a retroperitoneal hematoma. She was also found to have an INR of 4.8. A CTA was obtained that revealed the possibility of extravasation. She was evaluated by vascular surgery who suggested coagulation reversal and transfer to [facility] for possible IR intervention w/ angiography and embolization.  **Interim History  She did not actually undergo IR intervention with angiography and embolization given that her coagulopathy is reversed.  Hemoglobin/hematocrit is been relatively stable for the last few days.  Patient still complain of  some abdominal pain and diet is being advanced slowly. Will advance to SOFT diet now. PT/OT recommending Home Health.  We are anticipating discharging the patient home today however she became acutely hypoxic and had low saturations in the low to mid 80s on room air.  A chest x-ray was obtained.  Patient also complained of significant abdominal pain again so we will repeat her CT scan of the abdomen pelvis today.  Chest x-ray showed minimal right-sided pleural effusion and CT of the abdomen pelvis showed "Slight interval enlargement of the retroperitoneal hematoma along the dorsal aspect of the duodenal sweep. No evidence of contrast extravasation. Likely vicarious excretion of contrast within the gallbladder. No evidence of cholelithiasis or cholecystitis"  Discharge Diagnoses:  Principal Problem:   Retroperitoneal hematoma Active Problems:   Essential hypertension, benign   Hypokalemia   Abdominal pain   Supratherapeutic INR   Factor V Leiden (HCC)  Retroperitoneal hematoma, slightly enlarged Abdominal pain, improving Supratherapeutic INR -CT Angio Abd/Pelvis showed "Active extravasation into right retroperitoneal hematoma, which has slightly enlarged since earlier scan. No definite pseudoaneurysm or etiology of bleeding identified." -RUQ U/S showed "There is a 9.3 x 7.2 x 5.2 cm rounded masslike collection with a fluid fluid level inferior to the liver and gallbladder. This masslike collection does not definitely arise from the aorta or right kidney. No internal blood flow. No correlate for this masslike collection was seen on the CT scan from January 2015. Recommend a CT scan of the abdomen and pelvis with intravenous and oral contrast. The gallbladder is distended but otherwise unremarkable. No stones, wall thickening, pericholecystic fluid, or Murphy's sign." -Patient underwent a repeat  CT scan of the abdomen pelvis today given that she continued to have some abdominal pain which was worse  than yesterday and showed "Slight interval enlargement of the retroperitoneal hematoma along the dorsal aspect of the duodenal sweep. No evidence of contrast extravasation. Likely vicarious excretion of contrast within the gallbladder. No evidence of cholelithiasis or cholecystitis." -S/p 2 units FFP and vit K at [place] -if INR did not stabilize and she becomes unstable, will need to get IR involved and get mesenteric angiography and embolization; Dr. Marylyn Ishihara spoke with IR (Dr. Laurence Ferrari), they are aware of the patient -Admitted to inpt, med-tele; expect greater than 2MN stay -Continue dto trend H&H q6h but will stop now that is relatively stable, transfuse as necessary -On 3/22:  Dr. Marylyn Ishihara spoke with IR about repeat imaging, hold for now; Hgb and INR are stable; she is improving. Start diet. -On 3/23: still having ab pain. Have added more consistent PRNs and BM regimen; Hgb is stable -Hgb/Hct is relatively stable at 9.0/29.1; Will go from FULL to SOFT and continue to Monitor tolerance  -We will need to discuss with interventional radiology as well as oncology prior to discharge; -I spoke with Dr. Maylon Peppers of Hematology who recommends not starting any anticoagulation for least 4 to 8 weeks if it is even necessary.  Patient has had a history of DVT in the past and Dr. Vertell Novak recommends finding out she has a homozygous or heterozygous factor V Leiden deficiency: He recommends outpatient follow-up with [facility]ology at [facility] and recommends early ambulation and SCDs for the patient currently -Likely needs Imaging i again n the outpatient setting in 4-6 weeks  -Continued IV fluid hydration with normal saline at 75 mL's per hour but now stopped -Patient had worsened abdominal pain so repeat CT scan as above -Continue monitor and continue with pain control -Abdominal pain is significantly improved and still slightly tender to palpate but she is tolerating her diet without issues.  She is stable for discharge only  to follow-up with PCP in hematology in outpatient setting and will also need to have a repeat CT scan of the abdomen pelvis to evaluate her hematoma  N/V, improved -C/w zofran,phenergan,fluids -Initially was NPO but was started on a clear liquid diet and still continued to have some abdominal pain; no longer nauseous or vomiting and diet was advanced to full and will go to soft -Continued with IV fluid hydration with normal saline at 75 mL's per hour will now be stopped -States her Abdominal Pain has improved and she is feeling better yesterday but had pain again today so repeat CT scan was done -No longer Nauseous and vomiting   Hypokalemia Hypomagnesemia -Improving -Patient's potassium is 3.8 today and magnesium is 2.0 -Continue to monitor and replete as necessary -Repeat CMP and Magnesium within 1 week   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 -She was initially subtherapeutic but now has a therapeutic INR with 1.2 -On 3/22: Held coumadin for now as the risks of restartingbleed into hematoma are too high -3/23:INR is holding stable, risks of restarting bleed are high with resuming coumadin;  -Discussed with heme-onc and they been holding all in any anticoagulation for at least 4 to 8 weeks and reevaluating if she actually needs anticoagulation to see if she is got a homozygous or heterozygous factor V Leiden deficiency -Follow up with Hematology as an outpatient   HTN -Has had N/V right now so has had inconsistent po intake for home meds -Has PRN  Hydralazine 10 mg IV q8hprn for SBP >170 -Elevations in BP likely related to ab pain but now due to her missed doses -Will resume BP medications with Losartan 50 mg  -Last blood pressure was 141/62  Obesity -Estimated body mass index is 39.94 kg/m as calculated from the following:   Height as of this encounter: 5\' 5"  (1.651 m).   Weight as of this encounter: 108.9 kg. -Dietary and Weight Loss Counseling given    Hyperbilirubinemia -Has been fluctuating and yesterday it was 2.6 and it is improved to 2.2 and improved and today it is 1.6 -Continue monitor and Trend -Repeat CMP in a.m. and RUQ U/S done earlier -If continues to worsen may need GI involvement but will hold off -Follow-up with PCP in the outpatient setting  RLQ, improved initially but had some more pain -Patient was complaining of some right lower quadrant and hip pain so a DG hip was obtained and showed "Frontal pelvis as well as frontal and lateral right hip images obtained. Bones are osteoporotic. No fracture or dislocation. There is mild symmetric narrowing of each hip joint. No evident erosive change. Sacroiliac joints bilaterally appear unremarkable." -Next CT of the abdomen pelvis and showed "There is a persistent retroperitoneal hematoma along the posterior wall of the duodenal sweep. Hematoma now measures approximately 5.8 x 12.5 cm in transverse dimension, and extends approximately 10.5 cm in craniocaudal length. Overall, minimal interval enlargement. No evidence of active contrast extravasation on this study. Trace fluid extends in the retroperitoneum inferiorly along the iliac vessels. No new areas of hemorrhage. No free intraperitoneal fluid or free gas." -CT Scan also showed "Likely vicarious excretion of contrast within the gallbladder. No evidence of cholelithiasis or cholecystitis." -Abdominal pain is improved only to follow-up with hematology in outpatient setting  Acute on Chronic Respiratory Failure with Hypoxia -Wears 1 Liter of O2 at Home  -Patient was saturating in the mid to low 80s on room air and states that she had a hard time taking a deep breath in -Chest x-ray showed "The aorta is tortuous. Heart size is enlarged. Minimal pleural effusion is identified in the right hemithorax. The left lung is clear. Bony structures are stable." -We will add some breathing treatments and give a one-time dose of IV Lasix of 40  mg Is now on scheduled Xopenex/Atrovent every 8 hours and I also added flutter valve, guaifenesin and incentive spirometry -Continue to have 2.5 mg nebs of albuterol every 6 as needed for wheezing or shortness of breath -Continue with budesonide 0.5 mg nebs twice daily scheduled at home  -Continuous pulse oximetry and maintain O2 saturations greater than 90% -Continue supplemental oxygen via nasal cannula and wean O2 as tolerated -Repeat chest x-ray in the a.m. and will also order for an ambulatory home O2 screen -Patient desaturated on ambulatory home O2 screen and required 2 L supplemental oxygen via nasal cannula and will be discharged with this -Chest x-ray looked improved--she will need to follow-up with pulmonary in outpatient setting  Discharge Instructions  Discharge Instructions    Call MD for:  difficulty breathing, headache or visual disturbances   Complete by: As directed    Call MD for:  extreme fatigue   Complete by: As directed    Call MD for:  hives   Complete by: As directed    Call MD for:  persistant dizziness or light-headedness   Complete by: As directed    Call MD for:  persistant nausea and vomiting   Complete by:  As directed    Call MD for:  redness, tenderness, or signs of infection (pain, swelling, redness, odor or green/yellow discharge around incision site)   Complete by: As directed    Call MD for:  severe uncontrolled pain   Complete by: As directed    Call MD for:  temperature >100.4   Complete by: As directed    Diet - low sodium heart healthy   Complete by: As directed    SOFT   Discharge instructions   Complete by: As directed    You were cared for by a hospitalist during your hospital stay. If you have any questions about your discharge medications or the care you received while you were in the hospital after you are discharged, you can call the unit and ask to speak with the hospitalist on call if the hospitalist that took care of you is not  available. Once you are discharged, your primary care physician will handle any further medical issues. Please note that NO REFILLS for any discharge medications will be authorized once you are discharged, as it is imperative that you return to your primary care physician (or establish a relationship with a primary care physician if you do not have one) for your aftercare needs so that they can reassess your need for medications and monitor your lab values.  Follow up with PCP, Hematology, and Pulmonary in the outpatient setting. Take all medications as prescribed. If symptoms change or worsen please return to the ED for evaluation   Increase activity slowly   Complete by: As directed      Allergies as of 04/27/2019   No Known Allergies     Medication List    STOP taking these medications   warfarin 5 MG tablet Commonly known as: COUMADIN     TAKE these medications   acetaminophen 500 MG tablet Commonly known as: TYLENOL Take 1,500 mg by mouth daily as needed for headache (pain).   albuterol 0.63 MG/3ML nebulizer solution Commonly known as: ACCUNEB Take 3 mLs (0.63 mg total) by nebulization every 6 (six) hours as needed for wheezing.   bisacodyl 10 MG suppository Commonly known as: DULCOLAX Place 1 suppository (10 mg total) rectally daily as needed for moderate constipation.   budesonide 0.5 MG/2ML nebulizer solution Commonly known as: PULMICORT Take 2 mLs by nebulization 2 (two) times daily as needed (shortness of breath).   citalopram 20 MG tablet Commonly known as: CELEXA Take 1 tablet (20 mg total) by mouth daily. What changed: when to take this   guaiFENesin 600 MG 12 hr tablet Commonly known as: MUCINEX Take 1 tablet (600 mg total) by mouth 2 (two) times daily.   losartan 50 MG tablet Commonly known as: COZAAR TAKE 1 TABLET BY MOUTH ONCE A DAY What changed:   how much to take  how to take this  when to take this  additional instructions   ondansetron 4 MG  tablet Commonly known as: ZOFRAN Take 1 tablet (4 mg total) by mouth every 6 (six) hours as needed for nausea.   oxyCODONE-acetaminophen 7.5-325 MG tablet Commonly known as: PERCOCET Take 1 tablet by mouth every 6 (six) hours as needed for moderate pain.   polyethylene glycol 17 g packet Commonly known as: MIRALAX / GLYCOLAX Take 17 g by mouth 2 (two) times daily.   rosuvastatin 5 MG tablet Commonly known as: CRESTOR Take 1 tablet (5 mg total) by mouth daily. What changed: when to take this   senna-docusate 8.6-50  MG tablet Commonly known as: Senokot-S Take 1 tablet by mouth at bedtime.            Durable Medical Equipment  (From admission, onward)         Start     Ordered   04/27/19 1205  For home use only DME 4 wheeled rolling walker with seat  Once    Question:  Patient needs a walker to treat with the following condition  Answer:  Weakness   04/27/19 [place], [place] Follow up.   Why: Dana will call to arrange visits with you starting 3/31. Contact information: [place] [address] [phone]        Einar Pheasant, MD. Call.   Specialty: Internal Medicine Why: Follow up with PCP within 1-2 weeks Contact information: [id number] [place] [address] [phone]        Cammie Sickle, MD. Call.   Specialties: Internal Medicine, Oncology Why: Follow up for Evaluation for Anticoagulation Discussion for Factor 5 Leiden Contact information: [place] [address] [phone]        [place]. Call.   Specialty: Pulmonary Rehab Why: Follow up with Pulmonologist in the outpatient setting  Contact information: [place] [id number] [place] [phone]         No Known Allergies  Consultations:  Discussed  Case with Hematology  Case was discussed with IR  Vascular Surgery   Procedures/Studies: DG Chest 2 View  Result Date: 04/19/2019 CLINICAL DATA:  Abdominal pain EXAM: CHEST - 2 VIEW COMPARISON:  01/31/2018 FINDINGS: The heart size and mediastinal contours are within normal limits. Both lungs are clear. The visualized skeletal structures are unremarkable. IMPRESSION: No active cardiopulmonary disease. Electronically Signed   By: Ulyses Jarred M.D.   On: 04/19/2019 05:46   CT ABDOMEN PELVIS W CONTRAST  Result Date: 04/26/2019 CLINICAL DATA:  Retroperitoneal hematoma, abdominal pain, improving supratherapeutic INR EXAM: CT ABDOMEN AND PELVIS WITH CONTRAST TECHNIQUE: Multidetector CT imaging of the abdomen and pelvis was performed using the standard protocol following bolus administration of intravenous contrast. CONTRAST:  147mL OMNIPAQUE IOHEXOL 300 MG/ML  SOLN COMPARISON:  04/19/2019 FINDINGS: Lower chest: No acute pleural or parenchymal lung disease. Hepatobiliary: High density material within the gallbladder likely vicarious excretion of contrast versus gallbladder sludge. This is new since prior study. The liver is unremarkable. Pancreas: Unremarkable. No pancreatic ductal dilatation or surrounding inflammatory changes. Spleen: Normal in size without focal abnormality. Adrenals/Urinary Tract: Bilateral renal cortical thinning. No urinary tract calculi or obstruction. The bladder is unremarkable. The adrenals are normal. Stomach/Bowel: No bowel obstruction or ileus. Normal appendix right lower quadrant. No bowel wall thickening or inflammatory changes. Vascular/Lymphatic: Extensive atherosclerosis of the abdominal aorta unchanged. No pathologic adenopathy. Reproductive: Uterus and bilateral adnexa are unremarkable. Other: There is a persistent retroperitoneal hematoma along the posterior wall of the duodenal sweep. Hematoma now measures approximately 5.8 x 12.5 cm in transverse dimension, and extends  approximately 10.5 cm in craniocaudal length. Overall, minimal interval enlargement. No evidence of active contrast extravasation on this study. Trace fluid extends in the retroperitoneum inferiorly along the iliac vessels. No new areas of hemorrhage. No free intraperitoneal fluid or free gas. Musculoskeletal: No acute or destructive bony lesions. Reconstructed images demonstrate no additional findings. IMPRESSION: 1. Slight interval enlargement of the  retroperitoneal hematoma along the dorsal aspect of the duodenal sweep. No evidence of contrast extravasation. 2. Likely vicarious excretion of contrast within the gallbladder. No evidence of cholelithiasis or cholecystitis. Electronically Signed   By: Randa Ngo M.D.   On: 04/26/2019 16:45   CT Abdomen Pelvis W Contrast  Result Date: 04/19/2019 CLINICAL DATA:  Upper abdominal pain x3 days EXAM: CT ABDOMEN AND PELVIS WITH CONTRAST TECHNIQUE: Multidetector CT imaging of the abdomen and pelvis was performed using the standard protocol following bolus administration of intravenous contrast. CONTRAST:  139mL OMNIPAQUE IOHEXOL 300 MG/ML  SOLN COMPARISON:  Ultrasound from the same day, and previous FINDINGS: Lower chest: Minimal dependent atelectasis in the lung bases. No pleural or pericardial effusion. Hepatobiliary: Stable subcentimeter low-attenuation nonspecific lesion in hepatic segment 7 (Im16,Se2) . no biliary ductal dilatation. Gallbladder unremarkable. Pancreas: No mass or ductal dilatation. Pancreatic displacement by retroperitoneal collection. Spleen: Normal in size without focal abnormality. Adrenals/Urinary Tract: Adrenals unremarkable. Kidneys enhance normally, without mass or hydronephrosis. Urinary bladder incompletely distended Stomach/Bowel: Stomach is incompletely distended, unremarkable. Duodenum is nondistended, displaced anteriorly by retroperitoneal process. Remainder of small bowel is nondistended. Appendix not discretely identified The  colon is nondilated, unremarkable. Vascular/Lymphatic: Aortoiliac Aortic Atherosclerosis (ICD10-170.0) without aneurysm. No abdominal or pelvic adenopathy. Reproductive: Uterus and bilateral adnexa are unremarkable. Other: There is a 9.9 x 7.2 x 5.5 cm right retroperitoneal complex collection displacing the pancreatic head and distal duodenum anteriorly. There is a thickened somewhat nodular rim. Fluid level within the collection suggests blood, less likely enteric contents in the absence of any gas bubbles. Hyperdense foci at the interface persist on delayed scans, without any convincing evidence of active extravasation. There are surrounding retroperitoneal inflammatory/edematous changes. No ascites. No free air. Musculoskeletal: Small umbilical hernia containing only fat. Multilevel spondylitic changes in lower thoracic and lumbar spine. Bilateral hip DJD. No fracture or worrisome bone lesion. IMPRESSION: 1. 9.9 cm right retroperitoneal complex collection, with probable hematocrit level, without convincing evidence of active extravasation. Potential etiologies include duodenal ulcer, pseudoaneurysm, pancreatic pseudocyst, or mass. 2. Small umbilical hernia containing only fat. Aortic Atherosclerosis (ICD10-I70.0). Electronically Signed   By: Lucrezia Europe M.D.   On: 04/19/2019 10:02   DG CHEST PORT 1 VIEW  Result Date: 04/27/2019 CLINICAL DATA:  Shortness of breath EXAM: PORTABLE CHEST 1 VIEW COMPARISON:  April 26, 2019 FINDINGS: The aorta is tortuous. The heart size is enlarged. The lungs are clear. No focal infiltrate, pulmonary edema, or pleural effusion is noted. Bony structures are stable. IMPRESSION: No active cardiopulmonary disease. Electronically Signed   By: Abelardo Diesel M.D.   On: 04/27/2019 08:18   DG CHEST PORT 1 VIEW  Result Date: 04/26/2019 CLINICAL DATA:  Hypoxia. EXAM: PORTABLE CHEST 1 VIEW COMPARISON:  April 19, 2019 FINDINGS: The aorta is tortuous. Heart size is enlarged. Minimal pleural  effusion is identified in the right hemithorax. The left lung is clear. Bony structures are stable. IMPRESSION: Minimal right pleural effusion. Electronically Signed   By: Abelardo Diesel M.D.   On: 04/26/2019 13:10   DG HIP UNILAT WITH PELVIS 2-3 VIEWS RIGHT  Result Date: 04/24/2019 CLINICAL DATA:  Pain EXAM: DG HIP (WITH OR WITHOUT PELVIS) 2-3V RIGHT COMPARISON:  None. FINDINGS: Frontal pelvis as well as frontal and lateral right hip images obtained. Bones are osteoporotic. No fracture or dislocation. There is mild symmetric narrowing of each hip joint. No evident erosive change. Sacroiliac joints bilaterally appear unremarkable. IMPRESSION: Mild symmetric narrowing of each hip joint. No fracture or  dislocation. No erosive change. Bones osteoporotic. Electronically Signed   By: Lowella Grip III M.D.   On: 04/24/2019 11:07   CT Angio Abd/Pel W and/or Wo Contrast  Result Date: 04/19/2019 CLINICAL DATA:  Retroperitoneal hematoma, rule out pseudoaneurysm EXAM: CTA ABDOMEN AND PELVIS WITHOUT AND WITH CONTRAST TECHNIQUE: Multidetector CT imaging of the abdomen and pelvis was performed using the standard protocol during bolus administration of intravenous contrast. Multiplanar reconstructed images and MIPs were obtained and reviewed to evaluate the vascular anatomy. CONTRAST:  138mL OMNIPAQUE IOHEXOL 350 MG/ML SOLN COMPARISON:  Earlier CT of the same day FINDINGS: VASCULAR Aorta: Moderate calcified atheromatous plaque. No dissection, aneurysm, or stenosis. Celiac: Patent without evidence of aneurysm, dissection, vasculitis or significant stenosis. SMA: Patent with classic distal branch anatomy. No evidence of aneurysm, dissection, vasculitis or significant stenosis. Renals: Single left, patent Single right, patent IMA: Patent without evidence of aneurysm, dissection, vasculitis or significant stenosis. Inflow: Mild scattered atheromatous plaque. No aneurysm, dissection, or stenosis. Proximal Outflow: Bilateral  common femoral and visualized portions of the superficial and profunda femoral arteries are patent without evidence of aneurysm, dissection, vasculitis or significant stenosis. Veins: Patent hepatic veins, portal vein, SMV, splenic vein, bilateral renal veins, IVC, iliac venous system. No venous pathology evident. Review of the MIP images confirms the above findings. NON-VASCULAR Lower chest: No pleural or pericardial effusion. Linear subpleural opacities posteriorly in the lung bases. Hepatobiliary: No focal liver abnormality is seen. No gallstones, gallbladder wall thickening, or biliary dilatation. Pancreas: No mass or ductal dilatation. Spleen: Normal in size without focal abnormality. Adrenals/Urinary Tract: Adrenal glands unremarkable 1.1 cm probable cyst, mid pole left kidney. No hydronephrosis. Urinary bladder physiologically distended. Stomach/Bowel: Stomach is incompletely distended with oral contrast material. The duodenum is nondilated, displaced anteriorly by retroperitoneal hematoma. Distal small bowel decompressed. Colon is nondilated, unremarkable. Lymphatic: No abdominal or pelvic adenopathy. Reproductive: Uterus and bilateral adnexa are unremarkable. Other: 10x8 x 6.5cm enlarging right retroperitoneal hematoma posterior to the duodenum and pancreatic head. There is no definite associated pseudoaneurysm although progressive contrast accumulation is evident in the collection on the delayed scan consistent with continued active extravasation, possibly from pancreaticoduodenal arterial branch. Persistent surrounding retroperitoneal inflammatory/edematous changes. No free air.  No ascites. Musculoskeletal: Small umbilical hernia containing mesenteric fat. Multilevel lumbar spondylitic changes. Bilateral hip DJD. No fracture or worrisome bone lesion. IMPRESSION: 1. Active extravasation into right retroperitoneal hematoma, which has slightly enlarged since earlier scan. No definite pseudoaneurysm or  etiology of bleeding identified. Critical Value/emergent results were discussed by telephone at the time of interpretation on 04/19/2019 at 12:59 pm to with Dr. Trula Slade, who verbally acknowledged these results. Electronically Signed   By: Lucrezia Europe M.D.   On: 04/19/2019 13:00   US ABDOMEN LIMITED RUQ  Result Date: 04/19/2019 CLINICAL DATA:  Upper abdominal pain for 3 days. EXAM: ULTRASOUND ABDOMEN LIMITED RIGHT UPPER QUADRANT COMPARISON:  None. FINDINGS: Gallbladder: The gallbladder is distended without wall thickening, pericholecystic fluid, stone, sludge or Murphy's sign. Common bile duct: Diameter: 3.6 mm Liver: No focal lesion identified. Within normal limits in parenchymal echogenicity. Portal vein is patent on color Doppler imaging with normal direction of blood flow towards the liver. Other: There is a 9.3 x 7.2 x 5.2 cm rounded masslike collection with a fluid fluid level inferior to the liver and gallbladder. This does not definitely arise from the aorta or the right kidney. IMPRESSION: 1. There is a 9.3 x 7.2 x 5.2 cm rounded masslike collection with a fluid fluid level  inferior to the liver and gallbladder. This masslike collection does not definitely arise from the aorta or right kidney. No internal blood flow. No correlate for this masslike collection was seen on the CT scan from January 2015. Recommend a CT scan of the abdomen and pelvis with intravenous and oral contrast. 2. The gallbladder is distended but otherwise unremarkable. No stones, wall thickening, pericholecystic fluid, or Murphy's sign. Electronically Signed   By: Dorise Bullion III M.D   On: 04/19/2019 06:37    Subjective: And examined at bedside and she states that she is doing much better.  Denies any chest pain, lightheadedness or dizziness.  Was a little short of breath but was not wearing her oxygen properly and once she put it back in her nose and she improved.  She desaturated on ambulatory home O2 screen but she does have  supplemental oxygen at home and will increase it to 2 L.  She will need to follow-up with PCP as well as pulmonary in outpatient setting.  Abdominal pain is much improved but is still slightly there and will be sent home with some pain medications.  She is stable for discharge and will need to follow-up with hematology in outpatient setting as well.   Discharge Exam: Vitals:   04/27/19 0827 04/27/19 1500  BP: (!) 141/62   Pulse: 81 79  Resp: 13 14  Temp: 98.6 F (37 C) 98.2 F (36.8 C)  SpO2: 90% 92%   Vitals:   04/27/19 0744 04/27/19 0750 04/27/19 0827 04/27/19 1500  BP:   (!) 141/62   Pulse:   81 79  Resp:   13 14  Temp:   98.6 F (37 C) 98.2 F (36.8 C)  TempSrc:   Oral Oral  SpO2: 96% 100% 90% 92%  Weight:      Height:       General: Pt is alert, awake, not in acute distress Cardiovascular: RRR, S1/S2 +, no rubs, no gallops Respiratory: CTA bilaterally, no wheezing, no rhonchi mildly diminished; unlabored breathing but is wearing 2 L supplemental oxygen via nasal cannula Abdominal: Soft, mildly tender, tdistended secondary to body habitus, bowel sounds + Extremities: no edema, no cyanosis  The results of significant diagnostics from this hospitalization (including imaging, microbiology, ancillary and laboratory) are listed below for reference.    Microbiology: Recent Results (from the past 240 hour(s))  Respiratory Panel by RT PCR (Flu A&B, Covid) - Nasopharyngeal Swab     Status: None   Collection Time: 04/19/19  7:29 AM   Specimen: Nasopharyngeal Swab  Result Value Ref Range Status   SARS Coronavirus 2 by RT PCR NEGATIVE NEGATIVE Final    Comment: (NOTE) SARS-CoV-2 target nucleic acids are NOT DETECTED. The SARS-CoV-2 RNA is generally detectable in upper respiratoy specimens during the acute phase of infection. The lowest concentration of SARS-CoV-2 viral copies this assay can detect is 131 copies/mL. A negative result does not preclude SARS-Cov-2 infection and  should not be used as the sole basis for treatment or other patient management decisions. A negative result may occur with  improper specimen collection/handling, submission of specimen other than nasopharyngeal swab, presence of viral mutation(s) within the areas targeted by this assay, and inadequate number of viral copies (<131 copies/mL). A negative result must be combined with clinical observations, patient history, and epidemiological information. The expected result is Negative. Fact Sheet for Patients:  PinkCheek.be Fact Sheet for Healthcare Providers:  GravelBags.it This test is not yet ap proved or cleared by the  [place] Architectural technologist and  has been authorized for detection and/or diagnosis of SARS-CoV-2 by FDA under an Print production planner (EUA). This EUA will remain  in effect (meaning this test can be used) for the duration of the COVID-19 declaration under Section 564(b)(1) of the Act, 21 U.S.C. section 360bbb-3(b)(1), unless the authorization is terminated or revoked sooner.    Influenza A by PCR NEGATIVE NEGATIVE Final   Influenza B by PCR NEGATIVE NEGATIVE Final    Comment: (NOTE) The Xpert Xpress SARS-CoV-2/FLU/RSV assay is intended as an aid in  the diagnosis of influenza from Nasopharyngeal swab specimens and  should not be used as a sole basis for treatment. Nasal washings and  aspirates are unacceptable for Xpert Xpress SARS-CoV-2/FLU/RSV  testing. Fact Sheet for Patients: PinkCheek.be Fact Sheet for Healthcare Providers: GravelBags.it This test is not yet approved or cleared by the [place] FDA and  has been authorized for detection and/or diagnosis of SARS-CoV-2 by  FDA under an Emergency Use Authorization (EUA). This EUA will remain  in effect (meaning this test can be used) for the duration of the  Covid-19 declaration under Section  564(b)(1) of the Act, 21  U.S.C. section 360bbb-3(b)(1), unless the authorization is  terminated or revoked. Performed at [facility], [address] [place], [place] [address]      Labs: BNP (last 3 results) No results for input(s): BNP in the last 8760 hours. Basic Metabolic Panel: Recent Labs  Lab 04/23/19 0343 04/24/19 0942 04/25/19 1125 04/26/19 0326 04/27/19 0352  NA 137 139 138 139 139  K 3.5 4.3 3.6 3.7 3.8  CL 100 102 99 98 96*  CO2 28 30 28 30  32  GLUCOSE 83 92 89 107* 98  BUN 5* 6* <5* <5* <5*  CREATININE 0.70 0.76 0.69 0.74 0.92  CALCIUM 7.9* 8.3* 8.4* 8.7* 8.7*  MG 1.8 1.9 1.8 1.9 2.0  PHOS 2.6 2.5 2.6 2.8 3.5   Liver Function Tests: Recent Labs  Lab 04/23/19 0935 04/24/19 0942 04/25/19 1125 04/26/19 0326 04/27/19 0352  AST 19 23 17 18 19   ALT 12 13 12 13 10   ALKPHOS 44 48 47 54 53  BILITOT 2.0* 2.6* 2.2* 1.7* 1.6*  PROT 5.5* 5.7* 5.6* 6.4* 6.2*  ALBUMIN 2.7* 2.9* 3.0* 3.1* 3.2*   No results for input(s): LIPASE, AMYLASE in the last 168 hours. No results for input(s): AMMONIA in the last 168 hours. CBC: Recent Labs  Lab 04/23/19 0343 04/23/19 0935 04/24/19 0942 04/24/19 1529 04/25/19 1125 04/26/19 0326 04/27/19 0352  WBC 5.7  --  5.7  --  5.7 7.1 7.0  NEUTROABS 3.3  --  3.6  --  3.4 3.8 3.6  HGB 8.8*   < > 9.2* 9.4* 9.0* 10.2* 10.2*  HCT 27.9*   < > 28.8* 29.1* 29.1* 32.2* 33.2*  MCV 91.2  --  90.3  --  90.1 89.7 91.5  PLT 220  --  251  --  288 342 315   < > = values in this interval not displayed.   Cardiac Enzymes: Recent Labs  Lab 04/21/19 1554  CKTOTAL 473*   BNP: Invalid input(s): POCBNP CBG: No results for input(s): GLUCAP in the last 168 hours. D-Dimer No results for input(s): DDIMER in the last 72 hours. Hgb A1c No results for input(s): HGBA1C in the last 72 hours. Lipid Profile No results for input(s): CHOL, HDL, LDLCALC, TRIG, CHOLHDL, LDLDIRECT in the last 72 hours. Thyroid function studies No  results for  input(s): TSH, T4TOTAL, T3FREE, THYROIDAB in the last 72 hours.  Invalid input(s): FREET3 Anemia work up No results for input(s): VITAMINB12, FOLATE, FERRITIN, TIBC, IRON, RETICCTPCT in the last 72 hours. Urinalysis    Component Value Date/Time   COLORURINE YELLOW (A) 04/19/2019 0049   APPEARANCEUR CLEAR (A) 04/19/2019 0049   APPEARANCEUR Hazy 02/23/2013 1407   LABSPEC 1.011 04/19/2019 0049   LABSPEC 1.010 02/23/2013 1407   PHURINE 7.0 04/19/2019 0049   GLUCOSEU NEGATIVE 04/19/2019 0049   GLUCOSEU Negative 02/23/2013 1407   GLUCOSEU NEGATIVE 02/10/2013 1032   HGBUR MODERATE (A) 04/19/2019 0049   BILIRUBINUR NEGATIVE 04/19/2019 0049   BILIRUBINUR neg 08/10/2014 1055   BILIRUBINUR Negative 02/23/2013 1407   KETONESUR 5 (A) 04/19/2019 0049   PROTEINUR NEGATIVE 04/19/2019 0049   UROBILINOGEN 0.2 08/10/2014 1055   UROBILINOGEN 0.2 02/10/2013 1032   NITRITE NEGATIVE 04/19/2019 0049   LEUKOCYTESUR TRACE (A) 04/19/2019 0049   LEUKOCYTESUR 3+ 02/23/2013 1407   Sepsis Labs Invalid input(s): PROCALCITONIN,  WBC,  LACTICIDVEN Microbiology Recent Results (from the past 240 hour(s))  Respiratory Panel by RT PCR (Flu A&B, Covid) - Nasopharyngeal Swab     Status: None   Collection Time: 04/19/19  7:29 AM   Specimen: Nasopharyngeal Swab  Result Value Ref Range Status   SARS Coronavirus 2 by RT PCR NEGATIVE NEGATIVE Final    Comment: (NOTE) SARS-CoV-2 target nucleic acids are NOT DETECTED. The SARS-CoV-2 RNA is generally detectable in upper respiratoy specimens during the acute phase of infection. The lowest concentration of SARS-CoV-2 viral copies this assay can detect is 131 copies/mL. A negative result does not preclude SARS-Cov-2 infection and should not be used as the sole basis for treatment or other patient management decisions. A negative result may occur with  improper specimen collection/handling, submission of specimen other than nasopharyngeal swab, presence of  viral mutation(s) within the areas targeted by this assay, and inadequate number of viral copies (<131 copies/mL). A negative result must be combined with clinical observations, patient history, and epidemiological information. The expected result is Negative. Fact Sheet for Patients:  PinkCheek.be Fact Sheet for Healthcare Providers:  GravelBags.it This test is not yet ap proved or cleared by the [place] FDA and  has been authorized for detection and/or diagnosis of SARS-CoV-2 by FDA under an Emergency Use Authorization (EUA). This EUA will remain  in effect (meaning this test can be used) for the duration of the COVID-19 declaration under Section 564(b)(1) of the Act, 21 U.S.C. section 360bbb-3(b)(1), unless the authorization is terminated or revoked sooner.    Influenza A by PCR NEGATIVE NEGATIVE Final   Influenza B by PCR NEGATIVE NEGATIVE Final    Comment: (NOTE) The Xpert Xpress SARS-CoV-2/FLU/RSV assay is intended as an aid in  the diagnosis of influenza from Nasopharyngeal swab specimens and  should not be used as a sole basis for treatment. Nasal washings and  aspirates are unacceptable for Xpert Xpress SARS-CoV-2/FLU/RSV  testing. Fact Sheet for Patients: PinkCheek.be Fact Sheet for Healthcare Providers: GravelBags.it This test is not yet approved or cleared by the [place] FDA and  has been authorized for detection and/or diagnosis of SARS-CoV-2 by  FDA under an Emergency Use Authorization (EUA). This EUA will remain  in effect (meaning this test can be used) for the duration of the  Covid-19 declaration under Section 564(b)(1) of the Act, 21  U.S.C. section 360bbb-3(b)(1), unless the authorization is  terminated or revoked. Performed at [facility], [address] [place],  [place] [address]    Time coordinating discharge: 35  minutes  SIGNED:  Kerney Elbe, DO [facility] 04/27/2019, 8:28 PM Pager is on [place]  If 7PM-7AM, please contact night-coverage [email or web address] Password [email or web address]

## 2019-04-27 NOTE — TOC Transition Note (Signed)
Transition of Care [facility]) - CM/SW Discharge Note   Patient Details  Name: Tina Duarte MRN: [id number] Date of Birth: [birth year]  Transition of Care [facility] CM/SW Contact:  Claudie Leach, RN 04/27/2019, 12:33 PM   Clinical Narrative:    Patient to dc home.  Patient has DME O2 at home with previous liter flow of 1 lpm with [employer].  Patient states she has portable oxygen that her family can bring to transport her home.    Patient would like [facility] PT/OT/HHA and requests [facility].  Referral accepted by Floydene Flock with [facility].    Final next level of care: [place] Barriers to Discharge: No Barriers Identified   Patient Goals and CMS Choice Patient states their goals for this hospitalization and ongoing recovery are:: to get home CMS Medicare.gov Compare Post Acute Care list provided to:: Patient Choice offered to / list presented to : Patient   Discharge Plan and Services                DME Arranged: Walker rolling with seat DME Agency: [employer] Date DME Agency Contacted: 04/27/19 Time DME Agency Contacted: 37 Representative spoke with at DME Agency: [place] Arranged: PT, OT, Nurse's Aide [place] Agency: [place] Date [place]: 04/27/19 Time [place]: 1233 Representative spoke with at [place]: Corene Cornea

## 2019-04-27 NOTE — Progress Notes (Signed)
Pt discharged today to home with family.  Pt taken off telemetry and CCMD notified.  Pt's IV removed. Pt left with all of their personal belongings.  AVS documentation reviewed with Pt and all question answered.

## 2019-04-28 ENCOUNTER — Telehealth: Payer: Self-pay

## 2019-04-28 DIAGNOSIS — Z748 Other problems related to care provider dependency: Secondary | ICD-10-CM

## 2019-04-28 NOTE — Telephone Encounter (Signed)
Transition Care Management Follow-up Telephone Call  Date of discharge and from where: 04/27/19 from [facility].  How have you been since you were released from the hospital? "I don't feel any better or any worse. Appetite has not changed." Denies ABD pain, but tender to touch. Denies confusion, headache, dizziness, nausea, vomiting. No change in bowel habits. Drinking plenty of lemon aide to stay hydrated.  Any questions or concerns? Unsure if she wants HH to come. Encouraged to let them come on 3/31 for evaluation.   Items Reviewed:  Did the pt receive and understand the discharge instructions provided? Yes, increase activity slowly.   Medications obtained and verified? Yes. Stop warfarin.  Any new allergies since your discharge? No  Dietary orders reviewed? Yes, low sodium, heart healthy. Currently on soft diet.   Do you have support at home? Yes, son   Functional Questionnaire: (I = Independent and D = Dependent) ADLs: I  Bathing/Dressing- I  Meal Prep- I  Eating- I  Maintaining continence- I  Transferring/Ambulation- Walker, cane  Managing Meds- I  Follow up appointments reviewed:   PCP Hospital f/u appt confirmed?  Scheduled to see Dr. Nicki Reaper on 05/08/19 @ 11:00.  Hematology and Pulmonary appt confirmed?  No. Nurse confirmed she has correct information for scheduling.   Are transportation arrangements needed? No  If their condition worsens, is the pt aware to call PCP or go to the Emergency Dept.? Yes  Was the patient provided with contact information for the PCP's office or ED? Yes  Was to pt encouraged to call back with questions or concerns? Yes

## 2019-04-29 ENCOUNTER — Telehealth: Payer: Self-pay | Admitting: Internal Medicine

## 2019-04-29 DIAGNOSIS — K661 Hemoperitoneum: Secondary | ICD-10-CM | POA: Diagnosis not present

## 2019-04-29 DIAGNOSIS — D6851 Activated protein C resistance: Secondary | ICD-10-CM | POA: Diagnosis not present

## 2019-04-29 DIAGNOSIS — E876 Hypokalemia: Secondary | ICD-10-CM | POA: Diagnosis not present

## 2019-04-29 DIAGNOSIS — J449 Chronic obstructive pulmonary disease, unspecified: Secondary | ICD-10-CM | POA: Diagnosis not present

## 2019-04-29 DIAGNOSIS — I10 Essential (primary) hypertension: Secondary | ICD-10-CM | POA: Diagnosis not present

## 2019-04-29 DIAGNOSIS — E7800 Pure hypercholesterolemia, unspecified: Secondary | ICD-10-CM | POA: Diagnosis not present

## 2019-04-29 DIAGNOSIS — J9621 Acute and chronic respiratory failure with hypoxia: Secondary | ICD-10-CM | POA: Diagnosis not present

## 2019-04-29 DIAGNOSIS — D6832 Hemorrhagic disorder due to extrinsic circulating anticoagulants: Secondary | ICD-10-CM | POA: Diagnosis not present

## 2019-04-29 DIAGNOSIS — T45515D Adverse effect of anticoagulants, subsequent encounter: Secondary | ICD-10-CM | POA: Diagnosis not present

## 2019-04-29 DIAGNOSIS — E785 Hyperlipidemia, unspecified: Secondary | ICD-10-CM | POA: Diagnosis not present

## 2019-04-29 NOTE — Addendum Note (Signed)
Addended by: Dia Crawford on: 04/29/2019 04:51 PM   Modules accepted: Orders

## 2019-04-29 NOTE — Telephone Encounter (Signed)
Patient has not yet scheduled appointments. States she was waiting until she can align with her calendar tomorrow due to transportation. Discussed possible coverage for transportation services to medical appointments. Agrees okay for me to place referral for future appointments. Referral sent to care team for transportation. Will follow to confirm scheduling.

## 2019-04-29 NOTE — Telephone Encounter (Signed)
Verbals given to [employer]

## 2019-04-29 NOTE — Telephone Encounter (Signed)
LMTCB

## 2019-04-29 NOTE — Telephone Encounter (Signed)
Tina Duarte with [facility] called needing verbals   Frequency   2 times a week for 3 weeks  1 time a week for 3 weeks    also home health aid to come out twice a week to help with bathing    can leave a message on [phone]

## 2019-04-29 NOTE — Telephone Encounter (Signed)
Thank you.  Just let me know and let me know if I need to do anything.

## 2019-04-29 NOTE — Telephone Encounter (Signed)
Reviewed your note and hospital discharge summary.  Please confirm with pt has f/u scheduled with hematology and pulmonary.  Thank you.

## 2019-04-29 NOTE — Telephone Encounter (Signed)
Will follow.

## 2019-04-30 ENCOUNTER — Telehealth: Payer: Self-pay

## 2019-04-30 DIAGNOSIS — J9611 Chronic respiratory failure with hypoxia: Secondary | ICD-10-CM

## 2019-04-30 NOTE — Telephone Encounter (Signed)
Spoke with patient regarding need for follow up on hematology and pulmonary appointments per discharge note.  Patient states she was successful scheduling hematology appointment 05/09/19. However, needs referral from PMD prior to scheduling pulmonary.

## 2019-05-01 ENCOUNTER — Telehealth: Payer: Self-pay

## 2019-05-01 DIAGNOSIS — D6851 Activated protein C resistance: Secondary | ICD-10-CM | POA: Diagnosis not present

## 2019-05-01 DIAGNOSIS — J449 Chronic obstructive pulmonary disease, unspecified: Secondary | ICD-10-CM | POA: Diagnosis not present

## 2019-05-01 DIAGNOSIS — E7800 Pure hypercholesterolemia, unspecified: Secondary | ICD-10-CM | POA: Diagnosis not present

## 2019-05-01 DIAGNOSIS — I10 Essential (primary) hypertension: Secondary | ICD-10-CM | POA: Diagnosis not present

## 2019-05-01 DIAGNOSIS — E785 Hyperlipidemia, unspecified: Secondary | ICD-10-CM | POA: Diagnosis not present

## 2019-05-01 DIAGNOSIS — E876 Hypokalemia: Secondary | ICD-10-CM | POA: Diagnosis not present

## 2019-05-01 DIAGNOSIS — T45515D Adverse effect of anticoagulants, subsequent encounter: Secondary | ICD-10-CM | POA: Diagnosis not present

## 2019-05-01 DIAGNOSIS — D6832 Hemorrhagic disorder due to extrinsic circulating anticoagulants: Secondary | ICD-10-CM | POA: Diagnosis not present

## 2019-05-01 DIAGNOSIS — J9621 Acute and chronic respiratory failure with hypoxia: Secondary | ICD-10-CM | POA: Diagnosis not present

## 2019-05-01 DIAGNOSIS — K661 Hemoperitoneum: Secondary | ICD-10-CM | POA: Diagnosis not present

## 2019-05-01 NOTE — Telephone Encounter (Signed)
Letter mailed to patient.

## 2019-05-01 NOTE — Telephone Encounter (Signed)
05/01/2019 Spoke with patient about [employer] and [employer].  Asked patient if she needed assistance calling [facility] or completing LINK application. I have partially filled out application patient is comfortable completing the form.  Let patient know the LINK application would be mailed to her to sign and mail to [place] [employer]. Emailed form to [employer].  Ambrose Mantle [phone]

## 2019-05-01 NOTE — Telephone Encounter (Signed)
Order placed for referral to pulmonary.  Thank you.

## 2019-05-02 ENCOUNTER — Telehealth: Payer: Self-pay | Admitting: Internal Medicine

## 2019-05-02 DIAGNOSIS — K661 Hemoperitoneum: Secondary | ICD-10-CM | POA: Diagnosis not present

## 2019-05-02 DIAGNOSIS — E7800 Pure hypercholesterolemia, unspecified: Secondary | ICD-10-CM | POA: Diagnosis not present

## 2019-05-02 DIAGNOSIS — J9621 Acute and chronic respiratory failure with hypoxia: Secondary | ICD-10-CM | POA: Diagnosis not present

## 2019-05-02 DIAGNOSIS — E876 Hypokalemia: Secondary | ICD-10-CM | POA: Diagnosis not present

## 2019-05-02 DIAGNOSIS — T45515D Adverse effect of anticoagulants, subsequent encounter: Secondary | ICD-10-CM | POA: Diagnosis not present

## 2019-05-02 DIAGNOSIS — E785 Hyperlipidemia, unspecified: Secondary | ICD-10-CM | POA: Diagnosis not present

## 2019-05-02 DIAGNOSIS — J449 Chronic obstructive pulmonary disease, unspecified: Secondary | ICD-10-CM | POA: Diagnosis not present

## 2019-05-02 DIAGNOSIS — D6832 Hemorrhagic disorder due to extrinsic circulating anticoagulants: Secondary | ICD-10-CM | POA: Diagnosis not present

## 2019-05-02 DIAGNOSIS — I10 Essential (primary) hypertension: Secondary | ICD-10-CM | POA: Diagnosis not present

## 2019-05-02 DIAGNOSIS — D6851 Activated protein C resistance: Secondary | ICD-10-CM | POA: Diagnosis not present

## 2019-05-02 NOTE — Telephone Encounter (Signed)
I placed order for referral.  Pt now has appt scheduled with pulmonary.  She is asking for a reminder for her appt with me next week - according to note.  Can you make a note so that I can get someone to reminder her of her appt.  Thanks.

## 2019-05-02 NOTE — Telephone Encounter (Signed)
-----   Message from Dia Crawford, LPN sent at [id number]  4:22 PM EDT ----- Regarding: RE: Patient call to [place] Spoke with patient. The note below is in regard to pulmonary referral needed. She needs a referral placed for follow up. States she tried to call and schedule per discharge and was declined without a referral from PMD. Patient notes she is not in a hurry to schedule this appointment. She was able to schedule with hematology on 05/09/19.   Patient is hoping someone will call her more than once next week to remind her of hfu with PMD on Thursday 05/08/19.   Thanks,   Denisa ----- Message ----- From: Einar Pheasant, MD Sent: 04/30/2019   2:09 PM EDT To: Dia Crawford, LPN Subject: FW: Patient call to [place]                         Can you help with this?  I am not sure where this is coming from.  Larena Glassman did not send Ms Tomas a letter.  I saw you had contact with her and was hoping you could help me clarify what I need to do and which department this is from.  Please let me know either way.    Dr Nicki Reaper ----- Message ----- From: Lynford Humphrey, RN Sent: 04/30/2019  11:26 AM EDT To: Einar Pheasant, MD Subject: Patient call to [place]                             Good morning,  Cherlin called to our department to let us know she needed a referral to the program. That she received a letter from  Dr Nicki Reaper.  That her doctor wants her to attend the program. Then she said she does not want to attend and would we ask for the referral so her doctor knows that she let us know about the need for a  referral. She also told our staff member she was not ready to come now.  We are not sure where to go from here.    She attended an orientation for the program in 10/2018 and one session. Then hurt her knee at home. She was called several times until December when we sent a letter to have her contact us and without  a response we discharged her.   We will follow up with her if you send a referral.    Thanks for your time in this matter, Wynona Canes

## 2019-05-05 ENCOUNTER — Telehealth: Payer: Self-pay | Admitting: Internal Medicine

## 2019-05-05 DIAGNOSIS — I10 Essential (primary) hypertension: Secondary | ICD-10-CM | POA: Diagnosis not present

## 2019-05-05 DIAGNOSIS — D6832 Hemorrhagic disorder due to extrinsic circulating anticoagulants: Secondary | ICD-10-CM | POA: Diagnosis not present

## 2019-05-05 DIAGNOSIS — K661 Hemoperitoneum: Secondary | ICD-10-CM | POA: Diagnosis not present

## 2019-05-05 DIAGNOSIS — D6851 Activated protein C resistance: Secondary | ICD-10-CM | POA: Diagnosis not present

## 2019-05-05 DIAGNOSIS — E785 Hyperlipidemia, unspecified: Secondary | ICD-10-CM | POA: Diagnosis not present

## 2019-05-05 DIAGNOSIS — E876 Hypokalemia: Secondary | ICD-10-CM | POA: Diagnosis not present

## 2019-05-05 DIAGNOSIS — J9621 Acute and chronic respiratory failure with hypoxia: Secondary | ICD-10-CM | POA: Diagnosis not present

## 2019-05-05 DIAGNOSIS — J449 Chronic obstructive pulmonary disease, unspecified: Secondary | ICD-10-CM | POA: Diagnosis not present

## 2019-05-05 DIAGNOSIS — T45515D Adverse effect of anticoagulants, subsequent encounter: Secondary | ICD-10-CM | POA: Diagnosis not present

## 2019-05-05 DIAGNOSIS — E7800 Pure hypercholesterolemia, unspecified: Secondary | ICD-10-CM | POA: Diagnosis not present

## 2019-05-05 NOTE — Telephone Encounter (Signed)
Tina Du[facility] with [facility] called about verbal order for home health OT  1x a week for one week Two times for two weeks One time for one week Please call [phone]

## 2019-05-05 NOTE — Telephone Encounter (Signed)
Verbals given  

## 2019-05-06 DIAGNOSIS — J9621 Acute and chronic respiratory failure with hypoxia: Secondary | ICD-10-CM | POA: Diagnosis not present

## 2019-05-06 DIAGNOSIS — I10 Essential (primary) hypertension: Secondary | ICD-10-CM | POA: Diagnosis not present

## 2019-05-06 DIAGNOSIS — D6832 Hemorrhagic disorder due to extrinsic circulating anticoagulants: Secondary | ICD-10-CM | POA: Diagnosis not present

## 2019-05-06 DIAGNOSIS — E785 Hyperlipidemia, unspecified: Secondary | ICD-10-CM | POA: Diagnosis not present

## 2019-05-06 DIAGNOSIS — T45515D Adverse effect of anticoagulants, subsequent encounter: Secondary | ICD-10-CM | POA: Diagnosis not present

## 2019-05-06 DIAGNOSIS — E876 Hypokalemia: Secondary | ICD-10-CM | POA: Diagnosis not present

## 2019-05-06 DIAGNOSIS — E7800 Pure hypercholesterolemia, unspecified: Secondary | ICD-10-CM | POA: Diagnosis not present

## 2019-05-06 DIAGNOSIS — J449 Chronic obstructive pulmonary disease, unspecified: Secondary | ICD-10-CM | POA: Diagnosis not present

## 2019-05-06 DIAGNOSIS — D6851 Activated protein C resistance: Secondary | ICD-10-CM | POA: Diagnosis not present

## 2019-05-06 DIAGNOSIS — K661 Hemoperitoneum: Secondary | ICD-10-CM | POA: Diagnosis not present

## 2019-05-07 DIAGNOSIS — K661 Hemoperitoneum: Secondary | ICD-10-CM | POA: Diagnosis not present

## 2019-05-07 DIAGNOSIS — D6851 Activated protein C resistance: Secondary | ICD-10-CM | POA: Diagnosis not present

## 2019-05-07 DIAGNOSIS — E876 Hypokalemia: Secondary | ICD-10-CM | POA: Diagnosis not present

## 2019-05-07 DIAGNOSIS — J449 Chronic obstructive pulmonary disease, unspecified: Secondary | ICD-10-CM | POA: Diagnosis not present

## 2019-05-07 DIAGNOSIS — J9621 Acute and chronic respiratory failure with hypoxia: Secondary | ICD-10-CM | POA: Diagnosis not present

## 2019-05-07 DIAGNOSIS — E785 Hyperlipidemia, unspecified: Secondary | ICD-10-CM | POA: Diagnosis not present

## 2019-05-07 DIAGNOSIS — I10 Essential (primary) hypertension: Secondary | ICD-10-CM | POA: Diagnosis not present

## 2019-05-07 DIAGNOSIS — T45515D Adverse effect of anticoagulants, subsequent encounter: Secondary | ICD-10-CM | POA: Diagnosis not present

## 2019-05-07 DIAGNOSIS — D6832 Hemorrhagic disorder due to extrinsic circulating anticoagulants: Secondary | ICD-10-CM | POA: Diagnosis not present

## 2019-05-07 DIAGNOSIS — E7800 Pure hypercholesterolemia, unspecified: Secondary | ICD-10-CM | POA: Diagnosis not present

## 2019-05-07 NOTE — Telephone Encounter (Signed)
Pt reminded of appt

## 2019-05-08 ENCOUNTER — Telehealth (INDEPENDENT_AMBULATORY_CARE_PROVIDER_SITE_OTHER): Payer: Medicare Other | Admitting: Internal Medicine

## 2019-05-08 ENCOUNTER — Other Ambulatory Visit: Payer: Self-pay

## 2019-05-08 DIAGNOSIS — J9611 Chronic respiratory failure with hypoxia: Secondary | ICD-10-CM

## 2019-05-08 DIAGNOSIS — I10 Essential (primary) hypertension: Secondary | ICD-10-CM | POA: Diagnosis not present

## 2019-05-08 DIAGNOSIS — K661 Hemoperitoneum: Secondary | ICD-10-CM

## 2019-05-08 DIAGNOSIS — J4520 Mild intermittent asthma, uncomplicated: Secondary | ICD-10-CM | POA: Diagnosis not present

## 2019-05-08 DIAGNOSIS — E7800 Pure hypercholesterolemia, unspecified: Secondary | ICD-10-CM | POA: Diagnosis not present

## 2019-05-08 DIAGNOSIS — R1084 Generalized abdominal pain: Secondary | ICD-10-CM | POA: Diagnosis not present

## 2019-05-08 DIAGNOSIS — D6851 Activated protein C resistance: Secondary | ICD-10-CM | POA: Diagnosis not present

## 2019-05-08 NOTE — Progress Notes (Signed)
[name], female   DOB: [birth year], 74 y.o.   MRN: [id number]   Virtual Visit via video Note  This visit type was conducted due to national recommendations for restrictions regarding the COVID-19 pandemic (e.g. social distancing).  This format is felt to be most appropriate for this patient at this time.  All issues noted in this document were discussed and addressed.  No physical exam was performed (except for noted visual exam findings with Video Visits).   I connected with Tina Duarte by a video enabled telemedicine application and verified that I am speaking with the correct person using two identifiers. Location patient: home Location provider: work Persons participating in the virtual visit: patient, provider  The limitations, risks, security and privacy concerns of performing an evaluation and management service by video and the availability of in person appointments have been discussed. The patient expressed understanding and agreed to proceed.   Reason for visit: Hospital follow up.   HPI: She was scheduled to come in to the office, but contacted office stating she wanted to do a virtual visit, because she did not have the proper equipment to transport her oxygen.  On questioning her she does have a portable tank.  Nurse discussed and is working on getting her additional equipment.    Here for hospital follow up.  She was admitted 04/19/19 - 04/27/19.  Admitted with abdominal pain and decreased appetite.  CT scan revealed a retroperitoneal hematoma.  INR 4.8.  CTA - possible extravasation.  Evaluated by vascular surgery and transferred to [facility]. Coagulopathy - reversed.  hgb remained stable.  Did not undergo - IR intervention with angiography and embolization.  Was planning for discharge and on the day of discharge became acutely hypoxic.  Oxygen saturations in the low to mid 80s and she reported increased abdominal pain.  CXR showed minimal right sided pleural effusion  and CT abdomen and pelvis showed "slight interval enlargement of the retroperitoneal hematoma".  No evidence of contrast extravasation.  No etiology of bleeding identified.  Pain improved.  She was discharged with recommendation no to restart anticoagulation for at least 4-8 weeks - if is necessary to restart - per hematology.  Recommended to f/u as outpatient with hematology.  Also recommended a f/u CT abdomen and pelvis as outpt - in 4-6 weeks.  Diet was advanced while in hospital and since her discharge, she reports she is eating and appetite has improved, but she cannot eat a lot at one time.  No nausea or vomiting. Feels full faster.  Bowels are moving.  No abdominal pain now.  States this has essentially resolved.  Regarding her respiratory issues, she was given a one time dose of lasix in the hospital.  Was placed on scheduled xopenex and atrovent, incentive spirometry and flutter valve added.  She was discharged on continuous oxygen and remains on 1 Liter [place].  She has a pulse ox at home and states she still needs to oxygen when she is up ambulating for any length of time.  (pulse ox ranging 88-95%).  Per discharge summary, f/u with pulmonary and hematology arranged.  She reports no increased cough or congestion.  Leg swelling is better.  She reports having problems sleeping.  Discussed melatonin.     ROS: See pertinent positives and negatives per HPI.  Past Medical History:  Diagnosis Date  . Allergy   . Asthma   . Chicken pox   . History of blood clots   .  Hypercholesterolemia   . Hypertension   . Retroperitoneal hematoma 03/2019    Past Surgical History:  Procedure Laterality Date  . blood clots  2008  . TUBAL LIGATION      Family History  Problem Relation Age of Onset  . Cancer Mother        Breast  . Heart disease Mother   . Stroke Mother   . Hypertension Mother   . Breast cancer Mother        36's  . Heart disease Father   . Stroke Father   . Hypertension Father   .  Diabetes Father   . Colon cancer Other        paternal cousin    SOCIAL HX: reviewed.    Current Outpatient Medications:  .  acetaminophen (TYLENOL) 500 MG tablet, Take 1,500 mg by mouth daily as needed for headache (pain)., Disp: , Rfl:  .  albuterol (ACCUNEB) 0.63 MG/3ML nebulizer solution, Take 3 mLs (0.63 mg total) by nebulization every 6 (six) hours as needed for wheezing. (Patient not taking: Reported on 05/08/2019), Disp: 75 mL, Rfl: 12 .  budesonide (PULMICORT) 0.5 MG/2ML nebulizer solution, Take 2 mLs by nebulization 2 (two) times daily as needed (shortness of breath). , Disp: , Rfl:  .  citalopram (CELEXA) 20 MG tablet, Take 1 tablet (20 mg total) by mouth daily. (Patient taking differently: Take 20 mg by mouth daily with lunch. ), Disp: 90 tablet, Rfl: 1 .  guaiFENesin (MUCINEX) 600 MG 12 hr tablet, Take 1 tablet (600 mg total) by mouth 2 (two) times daily., Disp: 10 tablet, Rfl: 0 .  losartan (COZAAR) 50 MG tablet, TAKE 1 TABLET BY MOUTH ONCE A DAY (Patient taking differently: Take 50 mg by mouth daily with lunch. ), Disp: 90 tablet, Rfl: 1 .  rosuvastatin (CRESTOR) 5 MG tablet, Take 1 tablet (5 mg total) by mouth daily. (Patient taking differently: Take 5 mg by mouth daily with lunch. ), Disp: 90 tablet, Rfl: 1 .  senna-docusate (SENOKOT-S) 8.6-50 MG tablet, Take 1 tablet by mouth at bedtime. (Patient not taking: Reported on 05/08/2019), Disp: 30 tablet, Rfl: 0  EXAM:  GENERAL: alert, oriented, appears well and in no acute distress  HEENT: atraumatic, conjunttiva clear, no obvious abnormalities on inspection of external nose and ears  NECK: normal movements of the head and neck  LUNGS: on inspection no signs of respiratory distress, breathing rate appears normal, no obvious gross SOB, gasping or wheezing  CV: no obvious cyanosis  PSYCH/NEURO: pleasant and cooperative, no obvious depression or anxiety, speech and thought processing grossly intact  ASSESSMENT AND  PLAN:  Discussed the following assessment and plan:  Abdominal pain Recently admitted with abdominal pain and decreased appetite as outlined.  CT scan revealed a retroperitoneal hematoma and changes around gallbladder as outlined.  Off coumadin.  Since discharge, her abdominal pain has essentially resolved.  Appetite has improved, but she is not able to "eat a lot at one time".  No nausea or vomiting.  Will need f/u CT scan as outpatient as outlined.    Asthma No increased cough or congestion.  On continuous oxygen.  Plan f/u with pulmonary as outlined.  Continue current inhaler regimen - pulmicort and rescue inhaler if needed.    Essential hypertension, benign Blood pressure elevated in hospital.  Will need to spot check her pressure and treat if remains elevated.    Factor V Leiden ([place]) Document history with previous DVT leg and history of arm clot.  Has been on coumadin.  With recent admission found to have retroperitoneal hematoma.  Off coumadin for now.  Hematology evaluated while in hospital and recommended to remain off coumadin for at least 4-8 weeks and to have f/u with hematology as outpatient to help determine if coumadin needs to be restarted.  Pt reports has f/u with hematology tomorrow.    Hypercholesterolemia Continue crestor.    Respiratory failure with hypoxia (HCC) On continuous oxygen.  CXR revealed minimal right sided pleural effusion.  Was given one time dose lasix in hospital.  Pt reports breathing is currently stable.  Still requiring oxygen.  Leg swelling is better.  Nurse discussed with her to get proper supplies for her oxygen.  Sees pulmonary.  Has f/u planned in 05/2019.    Retroperitoneal hematoma Found on recent CT scan - during admission for abdominal pain.  Coagulopathy reversed.  Did not undergo intervention by IR.  Needs f/u CT scan as outpatient.  Will coordinate with hematology plans.      I discussed the assessment and treatment plan with the patient.  The patient was provided an opportunity to ask questions and all were answered. The patient agreed with the plan and demonstrated an understanding of the instructions.   The patient was advised to call back or seek an in-person evaluation if the symptoms worsen or if the condition fails to improve as anticipated.    Einar Pheasant, MD

## 2019-05-09 ENCOUNTER — Inpatient Hospital Stay: Payer: Medicare Other | Admitting: Internal Medicine

## 2019-05-09 ENCOUNTER — Encounter: Payer: Self-pay | Admitting: Internal Medicine

## 2019-05-09 ENCOUNTER — Inpatient Hospital Stay: Payer: Medicare Other

## 2019-05-09 DIAGNOSIS — J449 Chronic obstructive pulmonary disease, unspecified: Secondary | ICD-10-CM | POA: Diagnosis not present

## 2019-05-09 DIAGNOSIS — D6859 Other primary thrombophilia: Secondary | ICD-10-CM | POA: Insufficient documentation

## 2019-05-09 DIAGNOSIS — E7800 Pure hypercholesterolemia, unspecified: Secondary | ICD-10-CM | POA: Diagnosis not present

## 2019-05-09 DIAGNOSIS — I10 Essential (primary) hypertension: Secondary | ICD-10-CM | POA: Diagnosis not present

## 2019-05-09 DIAGNOSIS — D6851 Activated protein C resistance: Secondary | ICD-10-CM | POA: Diagnosis not present

## 2019-05-09 DIAGNOSIS — T45515D Adverse effect of anticoagulants, subsequent encounter: Secondary | ICD-10-CM | POA: Diagnosis not present

## 2019-05-09 DIAGNOSIS — E785 Hyperlipidemia, unspecified: Secondary | ICD-10-CM | POA: Diagnosis not present

## 2019-05-09 DIAGNOSIS — K661 Hemoperitoneum: Secondary | ICD-10-CM | POA: Diagnosis not present

## 2019-05-09 DIAGNOSIS — E876 Hypokalemia: Secondary | ICD-10-CM | POA: Diagnosis not present

## 2019-05-09 DIAGNOSIS — D6832 Hemorrhagic disorder due to extrinsic circulating anticoagulants: Secondary | ICD-10-CM | POA: Diagnosis not present

## 2019-05-09 DIAGNOSIS — J9621 Acute and chronic respiratory failure with hypoxia: Secondary | ICD-10-CM | POA: Diagnosis not present

## 2019-05-09 NOTE — Progress Notes (Unsigned)
[facility] CONSULT NOTE  Patient Care Team: Einar Pheasant, MD as PCP - General (Internal Medicine)  CHIEF COMPLAINTS/PURPOSE OF CONSULTATION: DVT/PE  #  Oncology History   No history exists.     HISTORY OF PRESENTING ILLNESS:  Tina Duarte 74 y.o.  female    She was admitted 04/19/19 - 04/27/19.  Admitted with abdominal pain and decreased appetite.  CT scan revealed a retroperitoneal hematoma.  INR 4.8.  CTA - possible extravasation.  Evaluated by vascular surgery and transferred to [facility]. Coagulopathy - reversed.  hgb remained stable.  Did not undergo - IR intervention with angiography and embolization.  Was planning for discharge and on the day of discharge became acutely hypoxic.  Oxygen saturations in the low to mid 80s and she reported increased abdominal pain.  CXR showed minimal right sided pleural effusion and CT abdomen and pelvis showed "slight interval enlargement of the retroperitoneal hematoma".  No evidence of contrast extravasation.  No etiology of bleeding identified.  Pain improved.  She was discharged with recommendation no to restart anticoagulation for at least 4-8 weeks - if is necessary to restart - per hematology.  Recommended to f/u as outpatient with hematology.  Also recommended a f/u CT abdomen and pelvis as outpt - in 4-6 weeks.   With regards risk factors: Long distance travel- Immobilization/trauma:  Previous history of DVT/PE:  Family history:  Birth control pills:   ROS   MEDICAL HISTORY:  Past Medical History:  Diagnosis Date  . Allergy   . Asthma   . Chicken pox   . History of blood clots   . Hypercholesterolemia   . Hypertension   . Retroperitoneal hematoma 03/2019    SURGICAL HISTORY: Past Surgical History:  Procedure Laterality Date  . blood clots  2008  . TUBAL LIGATION      SOCIAL HISTORY: Social History   Socioeconomic History  . Marital status: Widowed    Spouse name: Not on file  . Number of children:  Not on file  . Years of education: Not on file  . Highest education level: Not on file  Occupational History  . Not on file  Tobacco Use  . Smoking status: Never Smoker  . Smokeless tobacco: Never Used  Substance and Sexual Activity  . Alcohol use: No    Alcohol/week: 0.0 standard drinks  . Drug use: No  . Sexual activity: Not on file  Other Topics Concern  . Not on file  Social History Narrative  . Not on file   Social Determinants of Health   Financial Resource Strain: Low Risk   . Difficulty of Paying Living Expenses: Not hard at all  Food Insecurity: No Food Insecurity  . Worried About Charity fundraiser in the Last Year: Never true  . Ran Out of Food in the Last Year: Never true  Transportation Needs: No Transportation Needs  . Lack of Transportation (Medical): No  . Lack of Transportation (Non-Medical): No  Physical Activity: Unknown  . Days of Exercise per Week: 0 days  . Minutes of Exercise per Session: Not on file  Stress: No Stress Concern Present  . Feeling of Stress : Not at all  Social Connections:   . Frequency of Communication with Friends and Family:   . Frequency of Social Gatherings with Friends and Family:   . Attends Religious Services:   . Active Member of Clubs or Organizations:   . Attends Archivist Meetings:   .  Marital Status:   Intimate Partner Violence:   . Fear of Current or Ex-Partner:   . Emotionally Abused:   Marland Kitchen Physically Abused:   . Sexually Abused:     FAMILY HISTORY: Family History  Problem Relation Age of Onset  . Cancer Mother        Breast  . Heart disease Mother   . Stroke Mother   . Hypertension Mother   . Breast cancer Mother        32's  . Heart disease Father   . Stroke Father   . Hypertension Father   . Diabetes Father   . Colon cancer Other        paternal cousin    ALLERGIES:  has No Known Allergies.  MEDICATIONS:  Current Outpatient Medications  Medication Sig Dispense Refill  . citalopram  (CELEXA) 20 MG tablet Take 1 tablet (20 mg total) by mouth daily. (Patient taking differently: Take 20 mg by mouth daily with lunch. ) 90 tablet 1  . guaiFENesin (MUCINEX) 600 MG 12 hr tablet Take 1 tablet (600 mg total) by mouth 2 (two) times daily. 10 tablet 0  . losartan (COZAAR) 50 MG tablet TAKE 1 TABLET BY MOUTH ONCE A DAY (Patient taking differently: Take 50 mg by mouth daily with lunch. ) 90 tablet 1  . rosuvastatin (CRESTOR) 5 MG tablet Take 1 tablet (5 mg total) by mouth daily. (Patient taking differently: Take 5 mg by mouth daily with lunch. ) 90 tablet 1  . acetaminophen (TYLENOL) 500 MG tablet Take 1,500 mg by mouth daily as needed for headache (pain).    Marland Kitchen albuterol (ACCUNEB) 0.63 MG/3ML nebulizer solution Take 3 mLs (0.63 mg total) by nebulization every 6 (six) hours as needed for wheezing. (Patient not taking: Reported on 05/08/2019) 75 mL 12  . budesonide (PULMICORT) 0.5 MG/2ML nebulizer solution Take 2 mLs by nebulization 2 (two) times daily as needed (shortness of breath).     . senna-docusate (SENOKOT-S) 8.6-50 MG tablet Take 1 tablet by mouth at bedtime. (Patient not taking: Reported on 05/08/2019) 30 tablet 0   No current facility-administered medications for this visit.      Marland Kitchen  PHYSICAL EXAMINATION:  There were no vitals filed for this visit. There were no vitals filed for this visit.  Physical Exam   LABORATORY DATA:  I have reviewed the data as listed Lab Results  Component Value Date   WBC 7.0 04/27/2019   HGB 10.2 (L) 04/27/2019   HCT 33.2 (L) 04/27/2019   MCV 91.5 04/27/2019   PLT 315 04/27/2019   Recent Labs    11/19/18 1509 11/19/18 1509 03/18/19 1521 04/19/19 0049 04/23/19 0935 04/24/19 0942 04/25/19 1125 04/26/19 0326 04/27/19 0352  NA 140   < > 140   < >  --    < > 138 139 139  K 4.4   < > 3.6   < >  --    < > 3.6 3.7 3.8  CL 103   < > 103   < >  --    < > 99 98 96*  CO2 29   < > 28   < >  --    < > 28 30 32  GLUCOSE 88   < > 85   < >  --     < > 89 107* 98  BUN 11   < > 13   < >  --    < > <5* <5* <5*  CREATININE 1.08   < > 0.99   < >  --    < > 0.69 0.74 0.92  CALCIUM 9.0   < > 8.8   < >  --    < > 8.4* 8.7* 8.7*  GFRNONAA  --   --   --    < >  --    < > >60 >60 >60  GFRAA  --   --   --    < >  --    < > >60 >60 >60  PROT 7.0   < > 6.5   < > 5.5*   < > 5.6* 6.4* 6.2*  ALBUMIN 4.4   < > 4.1   < > 2.7*   < > 3.0* 3.1* 3.2*  AST 29   < > 24   < > 19   < > 17 18 19   ALT 20   < > 15   < > 12   < > 12 13 10   ALKPHOS 63   < > 58   < > 44   < > 47 54 53  BILITOT 0.8   < > 0.9   < > 2.0*   < > 2.2* 1.7* 1.6*  BILIDIR 0.1  --  0.2  --  0.3*  --   --   --   --   IBILI  --   --   --   --  1.7*  --   --   --   --    < > = values in this interval not displayed.    RADIOGRAPHIC STUDIES: I have personally reviewed the radiological images as listed and agreed with the findings in the report. DG Chest 2 View  Result Date: 04/19/2019 CLINICAL DATA:  Abdominal pain EXAM: CHEST - 2 VIEW COMPARISON:  01/31/2018 FINDINGS: The heart size and mediastinal contours are within normal limits. Both lungs are clear. The visualized skeletal structures are unremarkable. IMPRESSION: No active cardiopulmonary disease. Electronically Signed   By: Ulyses Jarred M.D.   On: 04/19/2019 05:46   CT ABDOMEN PELVIS W CONTRAST  Result Date: 04/26/2019 CLINICAL DATA:  Retroperitoneal hematoma, abdominal pain, improving supratherapeutic INR EXAM: CT ABDOMEN AND PELVIS WITH CONTRAST TECHNIQUE: Multidetector CT imaging of the abdomen and pelvis was performed using the standard protocol following bolus administration of intravenous contrast. CONTRAST:  148mL OMNIPAQUE IOHEXOL 300 MG/ML  SOLN COMPARISON:  04/19/2019 FINDINGS: Lower chest: No acute pleural or parenchymal lung disease. Hepatobiliary: High density material within the gallbladder likely vicarious excretion of contrast versus gallbladder sludge. This is new since prior study. The liver is unremarkable. Pancreas:  Unremarkable. No pancreatic ductal dilatation or surrounding inflammatory changes. Spleen: Normal in size without focal abnormality. Adrenals/Urinary Tract: Bilateral renal cortical thinning. No urinary tract calculi or obstruction. The bladder is unremarkable. The adrenals are normal. Stomach/Bowel: No bowel obstruction or ileus. Normal appendix right lower quadrant. No bowel wall thickening or inflammatory changes. Vascular/Lymphatic: Extensive atherosclerosis of the abdominal aorta unchanged. No pathologic adenopathy. Reproductive: Uterus and bilateral adnexa are unremarkable. Other: There is a persistent retroperitoneal hematoma along the posterior wall of the duodenal sweep. Hematoma now measures approximately 5.8 x 12.5 cm in transverse dimension, and extends approximately 10.5 cm in craniocaudal length. Overall, minimal interval enlargement. No evidence of active contrast extravasation on this study. Trace fluid extends in the retroperitoneum inferiorly along the iliac vessels. No new areas of hemorrhage. No free intraperitoneal fluid or free gas. Musculoskeletal: No acute  or destructive bony lesions. Reconstructed images demonstrate no additional findings. IMPRESSION: 1. Slight interval enlargement of the retroperitoneal hematoma along the dorsal aspect of the duodenal sweep. No evidence of contrast extravasation. 2. Likely vicarious excretion of contrast within the gallbladder. No evidence of cholelithiasis or cholecystitis. Electronically Signed   By: Randa Ngo M.D.   On: 04/26/2019 16:45   CT Abdomen Pelvis W Contrast  Result Date: 04/19/2019 CLINICAL DATA:  Upper abdominal pain x3 days EXAM: CT ABDOMEN AND PELVIS WITH CONTRAST TECHNIQUE: Multidetector CT imaging of the abdomen and pelvis was performed using the standard protocol following bolus administration of intravenous contrast. CONTRAST:  166mL OMNIPAQUE IOHEXOL 300 MG/ML  SOLN COMPARISON:  Ultrasound from the same day, and previous  FINDINGS: Lower chest: Minimal dependent atelectasis in the lung bases. No pleural or pericardial effusion. Hepatobiliary: Stable subcentimeter low-attenuation nonspecific lesion in hepatic segment 7 (Im16,Se2) . no biliary ductal dilatation. Gallbladder unremarkable. Pancreas: No mass or ductal dilatation. Pancreatic displacement by retroperitoneal collection. Spleen: Normal in size without focal abnormality. Adrenals/Urinary Tract: Adrenals unremarkable. Kidneys enhance normally, without mass or hydronephrosis. Urinary bladder incompletely distended Stomach/Bowel: Stomach is incompletely distended, unremarkable. Duodenum is nondistended, displaced anteriorly by retroperitoneal process. Remainder of small bowel is nondistended. Appendix not discretely identified The colon is nondilated, unremarkable. Vascular/Lymphatic: Aortoiliac Aortic Atherosclerosis (ICD10-170.0) without aneurysm. No abdominal or pelvic adenopathy. Reproductive: Uterus and bilateral adnexa are unremarkable. Other: There is a 9.9 x 7.2 x 5.5 cm right retroperitoneal complex collection displacing the pancreatic head and distal duodenum anteriorly. There is a thickened somewhat nodular rim. Fluid level within the collection suggests blood, less likely enteric contents in the absence of any gas bubbles. Hyperdense foci at the interface persist on delayed scans, without any convincing evidence of active extravasation. There are surrounding retroperitoneal inflammatory/edematous changes. No ascites. No free air. Musculoskeletal: Small umbilical hernia containing only fat. Multilevel spondylitic changes in lower thoracic and lumbar spine. Bilateral hip DJD. No fracture or worrisome bone lesion. IMPRESSION: 1. 9.9 cm right retroperitoneal complex collection, with probable hematocrit level, without convincing evidence of active extravasation. Potential etiologies include duodenal ulcer, pseudoaneurysm, pancreatic pseudocyst, or mass. 2. Small umbilical  hernia containing only fat. Aortic Atherosclerosis (ICD10-I70.0). Electronically Signed   By: Lucrezia Europe M.D.   On: 04/19/2019 10:02   DG CHEST PORT 1 VIEW  Result Date: 04/27/2019 CLINICAL DATA:  Shortness of breath EXAM: PORTABLE CHEST 1 VIEW COMPARISON:  April 26, 2019 FINDINGS: The aorta is tortuous. The heart size is enlarged. The lungs are clear. No focal infiltrate, pulmonary edema, or pleural effusion is noted. Bony structures are stable. IMPRESSION: No active cardiopulmonary disease. Electronically Signed   By: Abelardo Diesel M.D.   On: 04/27/2019 08:18   DG CHEST PORT 1 VIEW  Result Date: 04/26/2019 CLINICAL DATA:  Hypoxia. EXAM: PORTABLE CHEST 1 VIEW COMPARISON:  April 19, 2019 FINDINGS: The aorta is tortuous. Heart size is enlarged. Minimal pleural effusion is identified in the right hemithorax. The left lung is clear. Bony structures are stable. IMPRESSION: Minimal right pleural effusion. Electronically Signed   By: Abelardo Diesel M.D.   On: 04/26/2019 13:10   DG HIP UNILAT WITH PELVIS 2-3 VIEWS RIGHT  Result Date: 04/24/2019 CLINICAL DATA:  Pain EXAM: DG HIP (WITH OR WITHOUT PELVIS) 2-3V RIGHT COMPARISON:  None. FINDINGS: Frontal pelvis as well as frontal and lateral right hip images obtained. Bones are osteoporotic. No fracture or dislocation. There is mild symmetric narrowing of each hip joint. No evident erosive  change. Sacroiliac joints bilaterally appear unremarkable. IMPRESSION: Mild symmetric narrowing of each hip joint. No fracture or dislocation. No erosive change. Bones osteoporotic. Electronically Signed   By: Lowella Grip III M.D.   On: 04/24/2019 11:07   CT Angio Abd/Pel W and/or Wo Contrast  Result Date: 04/19/2019 CLINICAL DATA:  Retroperitoneal hematoma, rule out pseudoaneurysm EXAM: CTA ABDOMEN AND PELVIS WITHOUT AND WITH CONTRAST TECHNIQUE: Multidetector CT imaging of the abdomen and pelvis was performed using the standard protocol during bolus administration of  intravenous contrast. Multiplanar reconstructed images and MIPs were obtained and reviewed to evaluate the vascular anatomy. CONTRAST:  1100mL OMNIPAQUE IOHEXOL 350 MG/ML SOLN COMPARISON:  Earlier CT of the same day FINDINGS: VASCULAR Aorta: Moderate calcified atheromatous plaque. No dissection, aneurysm, or stenosis. Celiac: Patent without evidence of aneurysm, dissection, vasculitis or significant stenosis. SMA: Patent with classic distal branch anatomy. No evidence of aneurysm, dissection, vasculitis or significant stenosis. Renals: Single left, patent Single right, patent IMA: Patent without evidence of aneurysm, dissection, vasculitis or significant stenosis. Inflow: Mild scattered atheromatous plaque. No aneurysm, dissection, or stenosis. Proximal Outflow: Bilateral common femoral and visualized portions of the superficial and profunda femoral arteries are patent without evidence of aneurysm, dissection, vasculitis or significant stenosis. Veins: Patent hepatic veins, portal vein, SMV, splenic vein, bilateral renal veins, IVC, iliac venous system. No venous pathology evident. Review of the MIP images confirms the above findings. NON-VASCULAR Lower chest: No pleural or pericardial effusion. Linear subpleural opacities posteriorly in the lung bases. Hepatobiliary: No focal liver abnormality is seen. No gallstones, gallbladder wall thickening, or biliary dilatation. Pancreas: No mass or ductal dilatation. Spleen: Normal in size without focal abnormality. Adrenals/Urinary Tract: Adrenal glands unremarkable 1.1 cm probable cyst, mid pole left kidney. No hydronephrosis. Urinary bladder physiologically distended. Stomach/Bowel: Stomach is incompletely distended with oral contrast material. The duodenum is nondilated, displaced anteriorly by retroperitoneal hematoma. Distal small bowel decompressed. Colon is nondilated, unremarkable. Lymphatic: No abdominal or pelvic adenopathy. Reproductive: Uterus and bilateral  adnexa are unremarkable. Other: 10x8 x 6.5cm enlarging right retroperitoneal hematoma posterior to the duodenum and pancreatic head. There is no definite associated pseudoaneurysm although progressive contrast accumulation is evident in the collection on the delayed scan consistent with continued active extravasation, possibly from pancreaticoduodenal arterial branch. Persistent surrounding retroperitoneal inflammatory/edematous changes. No free air.  No ascites. Musculoskeletal: Small umbilical hernia containing mesenteric fat. Multilevel lumbar spondylitic changes. Bilateral hip DJD. No fracture or worrisome bone lesion. IMPRESSION: 1. Active extravasation into right retroperitoneal hematoma, which has slightly enlarged since earlier scan. No definite pseudoaneurysm or etiology of bleeding identified. Critical Value/emergent results were discussed by telephone at the time of interpretation on 04/19/2019 at 12:59 pm to with Dr. Trula Slade, who verbally acknowledged these results. Electronically Signed   By: Lucrezia Europe M.D.   On: 04/19/2019 13:00   US ABDOMEN LIMITED RUQ  Result Date: 04/19/2019 CLINICAL DATA:  Upper abdominal pain for 3 days. EXAM: ULTRASOUND ABDOMEN LIMITED RIGHT UPPER QUADRANT COMPARISON:  None. FINDINGS: Gallbladder: The gallbladder is distended without wall thickening, pericholecystic fluid, stone, sludge or Murphy's sign. Common bile duct: Diameter: 3.6 mm Liver: No focal lesion identified. Within normal limits in parenchymal echogenicity. Portal vein is patent on color Doppler imaging with normal direction of blood flow towards the liver. Other: There is a 9.3 x 7.2 x 5.2 cm rounded masslike collection with a fluid fluid level inferior to the liver and gallbladder. This does not definitely arise from the aorta or the right kidney. IMPRESSION: 1.  There is a 9.3 x 7.2 x 5.2 cm rounded masslike collection with a fluid fluid level inferior to the liver and gallbladder. This masslike collection  does not definitely arise from the aorta or right kidney. No internal blood flow. No correlate for this masslike collection was seen on the CT scan from January 2015. Recommend a CT scan of the abdomen and pelvis with intravenous and oral contrast. 2. The gallbladder is distended but otherwise unremarkable. No stones, wall thickening, pericholecystic fluid, or Murphy's sign. Electronically Signed   By: Dorise Bullion III M.D   On: 04/19/2019 06:37    ASSESSMENT & PLAN:   No problem-specific Assessment & Plan notes found for this encounter.    All questions were answered. The patient knows to call the clinic with any problems, questions or concerns.       Cammie Sickle, MD 05/09/2019 8:25 AM

## 2019-05-09 NOTE — Assessment & Plan Note (Signed)
On continuous oxygen.  CXR revealed minimal right sided pleural effusion.  Was given one time dose lasix in hospital.  Pt reports breathing is currently stable.  Still requiring oxygen.  Leg swelling is better.  Nurse discussed with her to get proper supplies for her oxygen.  Sees pulmonary.  Has f/u planned in 05/2019.

## 2019-05-09 NOTE — Assessment & Plan Note (Signed)
No increased cough or congestion.  On continuous oxygen.  Plan f/u with pulmonary as outlined.  Continue current inhaler regimen - pulmicort and rescue inhaler if needed.

## 2019-05-09 NOTE — Assessment & Plan Note (Signed)
Recently admitted with abdominal pain and decreased appetite as outlined.  CT scan revealed a retroperitoneal hematoma and changes around gallbladder as outlined.  Off coumadin.  Since discharge, her abdominal pain has essentially resolved.  Appetite has improved, but she is not able to "eat a lot at one time".  No nausea or vomiting.  Will need f/u CT scan as outpatient as outlined.

## 2019-05-09 NOTE — Assessment & Plan Note (Signed)
Blood pressure elevated in hospital.  Will need to spot check her pressure and treat if remains elevated.

## 2019-05-09 NOTE — Assessment & Plan Note (Signed)
Document history with previous DVT leg and history of arm clot.  Has been on coumadin.  With recent admission found to have retroperitoneal hematoma.  Off coumadin for now.  Hematology evaluated while in hospital and recommended to remain off coumadin for at least 4-8 weeks and to have f/u with hematology as outpatient to help determine if coumadin needs to be restarted.  Pt reports has f/u with hematology tomorrow.

## 2019-05-09 NOTE — Assessment & Plan Note (Signed)
Continue crestor 

## 2019-05-09 NOTE — Assessment & Plan Note (Signed)
Found on recent CT scan - during admission for abdominal pain.  Coagulopathy reversed.  Did not undergo intervention by IR.  Needs f/u CT scan as outpatient.  Will coordinate with hematology plans.

## 2019-05-11 DIAGNOSIS — J449 Chronic obstructive pulmonary disease, unspecified: Secondary | ICD-10-CM | POA: Diagnosis not present

## 2019-05-12 ENCOUNTER — Telehealth: Payer: Self-pay

## 2019-05-12 ENCOUNTER — Telehealth: Payer: Self-pay | Admitting: Internal Medicine

## 2019-05-12 DIAGNOSIS — J449 Chronic obstructive pulmonary disease, unspecified: Secondary | ICD-10-CM | POA: Diagnosis not present

## 2019-05-12 DIAGNOSIS — E7800 Pure hypercholesterolemia, unspecified: Secondary | ICD-10-CM | POA: Diagnosis not present

## 2019-05-12 DIAGNOSIS — D6851 Activated protein C resistance: Secondary | ICD-10-CM | POA: Diagnosis not present

## 2019-05-12 DIAGNOSIS — E785 Hyperlipidemia, unspecified: Secondary | ICD-10-CM | POA: Diagnosis not present

## 2019-05-12 DIAGNOSIS — T45515D Adverse effect of anticoagulants, subsequent encounter: Secondary | ICD-10-CM | POA: Diagnosis not present

## 2019-05-12 DIAGNOSIS — J9621 Acute and chronic respiratory failure with hypoxia: Secondary | ICD-10-CM | POA: Diagnosis not present

## 2019-05-12 DIAGNOSIS — D6832 Hemorrhagic disorder due to extrinsic circulating anticoagulants: Secondary | ICD-10-CM | POA: Diagnosis not present

## 2019-05-12 DIAGNOSIS — I10 Essential (primary) hypertension: Secondary | ICD-10-CM | POA: Diagnosis not present

## 2019-05-12 DIAGNOSIS — E876 Hypokalemia: Secondary | ICD-10-CM | POA: Diagnosis not present

## 2019-05-12 DIAGNOSIS — K661 Hemoperitoneum: Secondary | ICD-10-CM | POA: Diagnosis not present

## 2019-05-12 NOTE — Telephone Encounter (Signed)
Noted  

## 2019-05-12 NOTE — Telephone Encounter (Signed)
Tina Duarte from [facility] needs a verbal order; vestivular with reposition if needed. Phone number is [phone].

## 2019-05-12 NOTE — Telephone Encounter (Signed)
05/12/19 Spoke with patient about [employer] transportation.  Spoke with Sharyn Lull at [employer] a Ride patient is set up in their system, there is no fare right now due to covid, door to door service, patient will need to sign and return paperwork. When [employer] starts to charge fares again patient will be placed on a program to assist with cost.  Patient will also sign and return [place] application and has [employer] transportation as back up.  Patient does no have any other needs. Closing referral. Ambrose Mantle [phone]

## 2019-05-13 ENCOUNTER — Inpatient Hospital Stay: Payer: Medicare Other

## 2019-05-13 ENCOUNTER — Inpatient Hospital Stay: Payer: Medicare Other | Attending: Internal Medicine | Admitting: Internal Medicine

## 2019-05-13 ENCOUNTER — Encounter: Payer: Self-pay | Admitting: Internal Medicine

## 2019-05-13 ENCOUNTER — Other Ambulatory Visit: Payer: Self-pay

## 2019-05-13 VITALS — BP 126/81 | HR 92 | Temp 98.2°F | Wt 223.0 lb

## 2019-05-13 DIAGNOSIS — Z7901 Long term (current) use of anticoagulants: Secondary | ICD-10-CM | POA: Diagnosis not present

## 2019-05-13 DIAGNOSIS — K661 Hemoperitoneum: Secondary | ICD-10-CM | POA: Diagnosis not present

## 2019-05-13 DIAGNOSIS — E669 Obesity, unspecified: Secondary | ICD-10-CM

## 2019-05-13 DIAGNOSIS — Z803 Family history of malignant neoplasm of breast: Secondary | ICD-10-CM | POA: Insufficient documentation

## 2019-05-13 DIAGNOSIS — Z86718 Personal history of other venous thrombosis and embolism: Secondary | ICD-10-CM | POA: Diagnosis not present

## 2019-05-13 DIAGNOSIS — Z800 Family history of malignant neoplasm of digestive organs: Secondary | ICD-10-CM | POA: Diagnosis not present

## 2019-05-13 DIAGNOSIS — D6859 Other primary thrombophilia: Secondary | ICD-10-CM | POA: Diagnosis not present

## 2019-05-13 DIAGNOSIS — Z993 Dependence on wheelchair: Secondary | ICD-10-CM | POA: Diagnosis not present

## 2019-05-13 DIAGNOSIS — Z79899 Other long term (current) drug therapy: Secondary | ICD-10-CM | POA: Insufficient documentation

## 2019-05-13 LAB — CBC WITH DIFFERENTIAL/PLATELET
Abs Immature Granulocytes: 0.05 10*3/uL (ref 0.00–0.07)
Basophils Absolute: 0.1 10*3/uL (ref 0.0–0.1)
Basophils Relative: 1 %
Eosinophils Absolute: 0.4 10*3/uL (ref 0.0–0.5)
Eosinophils Relative: 4 %
HCT: 39.6 % (ref 36.0–46.0)
Hemoglobin: 12.5 g/dL (ref 12.0–15.0)
Immature Granulocytes: 1 %
Lymphocytes Relative: 26 %
Lymphs Abs: 2.2 10*3/uL (ref 0.7–4.0)
MCH: 28.4 pg (ref 26.0–34.0)
MCHC: 31.6 g/dL (ref 30.0–36.0)
MCV: 90 fL (ref 80.0–100.0)
Monocytes Absolute: 0.7 10*3/uL (ref 0.1–1.0)
Monocytes Relative: 8 %
Neutro Abs: 5.2 10*3/uL (ref 1.7–7.7)
Neutrophils Relative %: 60 %
Platelets: 256 10*3/uL (ref 150–400)
RBC: 4.4 MIL/uL (ref 3.87–5.11)
RDW: 13.9 % (ref 11.5–15.5)
WBC: 8.5 10*3/uL (ref 4.0–10.5)
nRBC: 0 % (ref 0.0–0.2)

## 2019-05-13 LAB — COMPREHENSIVE METABOLIC PANEL
ALT: 14 U/L (ref 0–44)
AST: 31 U/L (ref 15–41)
Albumin: 4 g/dL (ref 3.5–5.0)
Alkaline Phosphatase: 64 U/L (ref 38–126)
Anion gap: 11 (ref 5–15)
BUN: 10 mg/dL (ref 8–23)
CO2: 25 mmol/L (ref 22–32)
Calcium: 8.8 mg/dL — ABNORMAL LOW (ref 8.9–10.3)
Chloride: 101 mmol/L (ref 98–111)
Creatinine, Ser: 0.9 mg/dL (ref 0.44–1.00)
GFR calc Af Amer: >60 mL/min (ref >60)
GFR calc non Af Amer: >60 mL/min (ref >60)
Glucose, Bld: 109 mg/dL — ABNORMAL HIGH (ref 70–99)
Potassium: 3.8 mmol/L (ref 3.5–5.1)
Sodium: 137 mmol/L (ref 135–145)
Total Bilirubin: 1.4 mg/dL — ABNORMAL HIGH (ref 0.3–1.2)
Total Protein: 7.5 g/dL (ref 6.5–8.1)

## 2019-05-13 NOTE — Progress Notes (Signed)
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   # COPD 1 lit/ [place]   Oncology History   No history exists.     HISTORY OF PRESENTING ILLNESS:  Tina Duarte 74 y.o.  female has been referred to [place] for further evaluation recommendations for anticoagulation the context of a recent retroperitoneal hematoma.  Patient was admitted to hospital in March 2021-for abdominal pain status post fall.  March 20th- CT scan revealed retroperitoneal hematoma; patient INR was 4.8.  Patient was evaluated by interventional radiology for possible angiography embolization.  However patient's hematoma improved off anticoagulation/reversal of anticoagulation.  Patient was discharged home off anticoagulation.  Patient is recuperating fairly well.  Denies any worsening abdominal pain nausea vomiting.  Review of Systems  Constitutional: Negative for chills, diaphoresis, fever, malaise/fatigue and weight loss.  HENT: Negative for nosebleeds and sore throat.   Eyes: Negative for double vision.  Respiratory: Positive for shortness of breath. Negative for cough, hemoptysis, sputum production and wheezing.   Cardiovascular: Negative for chest pain, palpitations, orthopnea and leg swelling.  Gastrointestinal: Negative for abdominal pain, blood in stool, constipation, diarrhea, heartburn, melena, nausea and vomiting.  Genitourinary: Negative for dysuria, frequency and urgency.  Musculoskeletal: Positive for back pain and joint pain.  Skin: Negative.  Negative for itching and rash.  Neurological: Negative for dizziness, tingling, focal weakness, weakness and headaches.  Endo/Heme/Allergies: Does not bruise/bleed easily.  Psychiatric/Behavioral:  Negative for depression. The patient is not nervous/anxious and does not have insomnia.      MEDICAL HISTORY:  Past Medical History:  Diagnosis Date  . Allergy   . Asthma   . Chicken pox   . History of blood clots   . Hypercholesterolemia   . Hypertension   . Retroperitoneal hematoma 03/2019    SURGICAL HISTORY: Past Surgical History:  Procedure Laterality Date  . blood clots  2008  . TUBAL LIGATION      SOCIAL HISTORY: Social History   Socioeconomic History  . Marital status: Widowed    Spouse name: Not on file  . Number of children: Not on file  . Years of education: Not on file  . Highest education level: Not on file  Occupational History  . Not on file  Tobacco Use  . Smoking status: Never Smoker  . Smokeless tobacco: Never Used  Substance and Sexual Activity  . Alcohol use: No    Alcohol/week: 0.0 standard drinks  . Drug use: No  . Sexual activity: Not on file  Other Topics Concern  . Not on file  Social History Narrative   Lives in [place] with son.  never smoked; no alcohol. Used to work in [employer]    Social Determinants of Health   Financial Resource Strain: Low Risk   . Difficulty of Paying Living Expenses: Not hard at all  Food Insecurity: No Food Insecurity  . Worried About Charity fundraiser in the Last Year: Never true  . Ran Out of Food in the Last Year: Never true  Transportation Needs: No Transportation Needs  . Lack of Transportation (Medical): No  . Lack of Transportation (Non-Medical): No  Physical Activity: Unknown  . Days of Exercise per Week: 0 days  . Minutes of Exercise per Session:  Not on file  Stress: No Stress Concern Present  . Feeling of Stress : Not at all  Social Connections:   . Frequency of Communication with Friends and Family:   . Frequency of Social Gatherings with Friends and Family:   . Attends Religious Services:   . Active Member of Clubs or Organizations:   . Attends Archivist Meetings:   Marland Kitchen  Marital Status:   Intimate Partner Violence:   . Fear of Current or Ex-Partner:   . Emotionally Abused:   Marland Kitchen Physically Abused:   . Sexually Abused:     FAMILY HISTORY: Family History  Problem Relation Age of Onset  . Cancer Mother        Breast  . Heart disease Mother   . Stroke Mother   . Hypertension Mother   . Breast cancer Mother        5's  . Heart disease Father   . Stroke Father   . Hypertension Father   . Diabetes Father   . Colon cancer Other        paternal cousin    ALLERGIES:  has No Known Allergies.  MEDICATIONS:  Current Outpatient Medications  Medication Sig Dispense Refill  . acetaminophen (TYLENOL) 500 MG tablet Take 1,500 mg by mouth daily as needed for headache (pain).    Marland Kitchen albuterol (ACCUNEB) 0.63 MG/3ML nebulizer solution Take 3 mLs (0.63 mg total) by nebulization every 6 (six) hours as needed for wheezing. 75 mL 12  . budesonide (PULMICORT) 0.5 MG/2ML nebulizer solution Take 2 mLs by nebulization 2 (two) times daily as needed (shortness of breath).     . citalopram (CELEXA) 20 MG tablet Take 1 tablet (20 mg total) by mouth daily. (Patient taking differently: Take 20 mg by mouth daily with lunch. ) 90 tablet 1  . guaiFENesin (MUCINEX) 600 MG 12 hr tablet Take 1 tablet (600 mg total) by mouth 2 (two) times daily. 10 tablet 0  . losartan (COZAAR) 50 MG tablet TAKE 1 TABLET BY MOUTH ONCE A DAY (Patient taking differently: Take 50 mg by mouth daily with lunch. ) 90 tablet 1  . rosuvastatin (CRESTOR) 5 MG tablet Take 1 tablet (5 mg total) by mouth daily. (Patient taking differently: Take 5 mg by mouth daily with lunch. ) 90 tablet 1  . senna-docusate (SENOKOT-S) 8.6-50 MG tablet Take 1 tablet by mouth at bedtime. 30 tablet 0   No current facility-administered medications for this visit.      Marland Kitchen  PHYSICAL EXAMINATION:  Vitals:   05/13/19 1417  BP: 126/81  Pulse: 92  Temp: 98.2 F (36.8 C)  SpO2: 98%   Filed Weights   05/13/19 1417  Weight: 223  lb (101.2 kg)    Physical Exam  Constitutional: She is oriented to person, place, and time and well-developed, well-nourished, and in no distress.  Obese.  In a wheelchair.  Accompanied by son.  HENT:  Head: Normocephalic and atraumatic.  Mouth/Throat: Oropharynx is clear and moist. No oropharyngeal exudate.  Eyes: Pupils are equal, round, and reactive to light.  Cardiovascular: Normal rate and regular rhythm.  Pulmonary/Chest: No respiratory distress. She has no wheezes.  Decreased air entry bilaterally.  Nasal cannula oxygen 1 L.  Abdominal: Soft. Bowel sounds are normal. She exhibits no distension and no mass. There is no abdominal tenderness. There is no rebound and no guarding.  Musculoskeletal:        General: No tenderness or edema. Normal range of motion.  Cervical back: Normal range of motion and neck supple.  Neurological: She is alert and oriented to person, place, and time.  Skin: Skin is warm.  Psychiatric: Affect normal.     LABORATORY DATA:  I have reviewed the data as listed Lab Results  Component Value Date   WBC 7.0 04/27/2019   HGB 10.2 (L) 04/27/2019   HCT 33.2 (L) 04/27/2019   MCV 91.5 04/27/2019   PLT 315 04/27/2019   Recent Labs    11/19/18 1509 11/19/18 1509 03/18/19 1521 04/19/19 0049 04/23/19 0935 04/24/19 0942 04/25/19 1125 04/26/19 0326 04/27/19 0352  NA 140   < > 140   < >  --    < > 138 139 139  K 4.4   < > 3.6   < >  --    < > 3.6 3.7 3.8  CL 103   < > 103   < >  --    < > 99 98 96*  CO2 29   < > 28   < >  --    < > 28 30 32  GLUCOSE 88   < > 85   < >  --    < > 89 107* 98  BUN 11   < > 13   < >  --    < > <5* <5* <5*  CREATININE 1.08   < > 0.99   < >  --    < > 0.69 0.74 0.92  CALCIUM 9.0   < > 8.8   < >  --    < > 8.4* 8.7* 8.7*  GFRNONAA  --   --   --    < >  --    < > >60 >60 >60  GFRAA  --   --   --    < >  --    < > >60 >60 >60  PROT 7.0   < > 6.5   < > 5.5*   < > 5.6* 6.4* 6.2*  ALBUMIN 4.4   < > 4.1   < > 2.7*   < > 3.0*  3.1* 3.2*  AST 29   < > 24   < > 19   < > 17 18 19   ALT 20   < > 15   < > 12   < > 12 13 10   ALKPHOS 63   < > 58   < > 44   < > 47 54 53  BILITOT 0.8   < > 0.9   < > 2.0*   < > 2.2* 1.7* 1.6*  BILIDIR 0.1  --  0.2  --  0.3*  --   --   --   --   IBILI  --   --   --   --  1.7*  --   --   --   --    < > = values in this interval not displayed.    RADIOGRAPHIC STUDIES: I have personally reviewed the radiological images as listed and agreed with the findings in the report. DG Chest 2 View  Result Date: 04/19/2019 CLINICAL DATA:  Abdominal pain EXAM: CHEST - 2 VIEW COMPARISON:  01/31/2018 FINDINGS: The heart size and mediastinal contours are within normal limits. Both lungs are clear. The visualized skeletal structures are unremarkable. IMPRESSION: No active cardiopulmonary disease. Electronically Signed   By: Ulyses Jarred M.D.   On: 04/19/2019 05:46   CT ABDOMEN PELVIS W CONTRAST  Result Date: 04/26/2019 CLINICAL DATA:  Retroperitoneal hematoma, abdominal pain, improving supratherapeutic INR EXAM: CT ABDOMEN AND PELVIS WITH CONTRAST TECHNIQUE: Multidetector CT imaging of the abdomen and pelvis was performed using the standard protocol following bolus administration of intravenous contrast. CONTRAST:  161mL OMNIPAQUE IOHEXOL 300 MG/ML  SOLN COMPARISON:  04/19/2019 FINDINGS: Lower chest: No acute pleural or parenchymal lung disease. Hepatobiliary: High density material within the gallbladder likely vicarious excretion of contrast versus gallbladder sludge. This is new since prior study. The liver is unremarkable. Pancreas: Unremarkable. No pancreatic ductal dilatation or surrounding inflammatory changes. Spleen: Normal in size without focal abnormality. Adrenals/Urinary Tract: Bilateral renal cortical thinning. No urinary tract calculi or obstruction. The bladder is unremarkable. The adrenals are normal. Stomach/Bowel: No bowel obstruction or ileus. Normal appendix right lower quadrant. No bowel wall  thickening or inflammatory changes. Vascular/Lymphatic: Extensive atherosclerosis of the abdominal aorta unchanged. No pathologic adenopathy. Reproductive: Uterus and bilateral adnexa are unremarkable. Other: There is a persistent retroperitoneal hematoma along the posterior wall of the duodenal sweep. Hematoma now measures approximately 5.8 x 12.5 cm in transverse dimension, and extends approximately 10.5 cm in craniocaudal length. Overall, minimal interval enlargement. No evidence of active contrast extravasation on this study. Trace fluid extends in the retroperitoneum inferiorly along the iliac vessels. No new areas of hemorrhage. No free intraperitoneal fluid or free gas. Musculoskeletal: No acute or destructive bony lesions. Reconstructed images demonstrate no additional findings. IMPRESSION: 1. Slight interval enlargement of the retroperitoneal hematoma along the dorsal aspect of the duodenal sweep. No evidence of contrast extravasation. 2. Likely vicarious excretion of contrast within the gallbladder. No evidence of cholelithiasis or cholecystitis. Electronically Signed   By: Randa Ngo M.D.   On: 04/26/2019 16:45   CT Abdomen Pelvis W Contrast  Result Date: 04/19/2019 CLINICAL DATA:  Upper abdominal pain x3 days EXAM: CT ABDOMEN AND PELVIS WITH CONTRAST TECHNIQUE: Multidetector CT imaging of the abdomen and pelvis was performed using the standard protocol following bolus administration of intravenous contrast. CONTRAST:  125mL OMNIPAQUE IOHEXOL 300 MG/ML  SOLN COMPARISON:  Ultrasound from the same day, and previous FINDINGS: Lower chest: Minimal dependent atelectasis in the lung bases. No pleural or pericardial effusion. Hepatobiliary: Stable subcentimeter low-attenuation nonspecific lesion in hepatic segment 7 (Im16,Se2) . no biliary ductal dilatation. Gallbladder unremarkable. Pancreas: No mass or ductal dilatation. Pancreatic displacement by retroperitoneal collection. Spleen: Normal in size  without focal abnormality. Adrenals/Urinary Tract: Adrenals unremarkable. Kidneys enhance normally, without mass or hydronephrosis. Urinary bladder incompletely distended Stomach/Bowel: Stomach is incompletely distended, unremarkable. Duodenum is nondistended, displaced anteriorly by retroperitoneal process. Remainder of small bowel is nondistended. Appendix not discretely identified The colon is nondilated, unremarkable. Vascular/Lymphatic: Aortoiliac Aortic Atherosclerosis (ICD10-170.0) without aneurysm. No abdominal or pelvic adenopathy. Reproductive: Uterus and bilateral adnexa are unremarkable. Other: There is a 9.9 x 7.2 x 5.5 cm right retroperitoneal complex collection displacing the pancreatic head and distal duodenum anteriorly. There is a thickened somewhat nodular rim. Fluid level within the collection suggests blood, less likely enteric contents in the absence of any gas bubbles. Hyperdense foci at the interface persist on delayed scans, without any convincing evidence of active extravasation. There are surrounding retroperitoneal inflammatory/edematous changes. No ascites. No free air. Musculoskeletal: Small umbilical hernia containing only fat. Multilevel spondylitic changes in lower thoracic and lumbar spine. Bilateral hip DJD. No fracture or worrisome bone lesion. IMPRESSION: 1. 9.9 cm right retroperitoneal complex collection, with probable hematocrit level, without convincing evidence of active extravasation. Potential etiologies include duodenal ulcer, pseudoaneurysm, pancreatic  pseudocyst, or mass. 2. Small umbilical hernia containing only fat. Aortic Atherosclerosis (ICD10-I70.0). Electronically Signed   By: Lucrezia Europe M.D.   On: 04/19/2019 10:02   DG CHEST PORT 1 VIEW  Result Date: 04/27/2019 CLINICAL DATA:  Shortness of breath EXAM: PORTABLE CHEST 1 VIEW COMPARISON:  April 26, 2019 FINDINGS: The aorta is tortuous. The heart size is enlarged. The lungs are clear. No focal infiltrate,  pulmonary edema, or pleural effusion is noted. Bony structures are stable. IMPRESSION: No active cardiopulmonary disease. Electronically Signed   By: Abelardo Diesel M.D.   On: 04/27/2019 08:18   DG CHEST PORT 1 VIEW  Result Date: 04/26/2019 CLINICAL DATA:  Hypoxia. EXAM: PORTABLE CHEST 1 VIEW COMPARISON:  April 19, 2019 FINDINGS: The aorta is tortuous. Heart size is enlarged. Minimal pleural effusion is identified in the right hemithorax. The left lung is clear. Bony structures are stable. IMPRESSION: Minimal right pleural effusion. Electronically Signed   By: Abelardo Diesel M.D.   On: 04/26/2019 13:10   DG HIP UNILAT WITH PELVIS 2-3 VIEWS RIGHT  Result Date: 04/24/2019 CLINICAL DATA:  Pain EXAM: DG HIP (WITH OR WITHOUT PELVIS) 2-3V RIGHT COMPARISON:  None. FINDINGS: Frontal pelvis as well as frontal and lateral right hip images obtained. Bones are osteoporotic. No fracture or dislocation. There is mild symmetric narrowing of each hip joint. No evident erosive change. Sacroiliac joints bilaterally appear unremarkable. IMPRESSION: Mild symmetric narrowing of each hip joint. No fracture or dislocation. No erosive change. Bones osteoporotic. Electronically Signed   By: Lowella Grip III M.D.   On: 04/24/2019 11:07   CT Angio Abd/Pel W and/or Wo Contrast  Result Date: 04/19/2019 CLINICAL DATA:  Retroperitoneal hematoma, rule out pseudoaneurysm EXAM: CTA ABDOMEN AND PELVIS WITHOUT AND WITH CONTRAST TECHNIQUE: Multidetector CT imaging of the abdomen and pelvis was performed using the standard protocol during bolus administration of intravenous contrast. Multiplanar reconstructed images and MIPs were obtained and reviewed to evaluate the vascular anatomy. CONTRAST:  165mL OMNIPAQUE IOHEXOL 350 MG/ML SOLN COMPARISON:  Earlier CT of the same day FINDINGS: VASCULAR Aorta: Moderate calcified atheromatous plaque. No dissection, aneurysm, or stenosis. Celiac: Patent without evidence of aneurysm, dissection,  vasculitis or significant stenosis. SMA: Patent with classic distal branch anatomy. No evidence of aneurysm, dissection, vasculitis or significant stenosis. Renals: Single left, patent Single right, patent IMA: Patent without evidence of aneurysm, dissection, vasculitis or significant stenosis. Inflow: Mild scattered atheromatous plaque. No aneurysm, dissection, or stenosis. Proximal Outflow: Bilateral common femoral and visualized portions of the superficial and profunda femoral arteries are patent without evidence of aneurysm, dissection, vasculitis or significant stenosis. Veins: Patent hepatic veins, portal vein, SMV, splenic vein, bilateral renal veins, IVC, iliac venous system. No venous pathology evident. Review of the MIP images confirms the above findings. NON-VASCULAR Lower chest: No pleural or pericardial effusion. Linear subpleural opacities posteriorly in the lung bases. Hepatobiliary: No focal liver abnormality is seen. No gallstones, gallbladder wall thickening, or biliary dilatation. Pancreas: No mass or ductal dilatation. Spleen: Normal in size without focal abnormality. Adrenals/Urinary Tract: Adrenal glands unremarkable 1.1 cm probable cyst, mid pole left kidney. No hydronephrosis. Urinary bladder physiologically distended. Stomach/Bowel: Stomach is incompletely distended with oral contrast material. The duodenum is nondilated, displaced anteriorly by retroperitoneal hematoma. Distal small bowel decompressed. Colon is nondilated, unremarkable. Lymphatic: No abdominal or pelvic adenopathy. Reproductive: Uterus and bilateral adnexa are unremarkable. Other: 10x8 x 6.5cm enlarging right retroperitoneal hematoma posterior to the duodenum and pancreatic head. There is no definite associated pseudoaneurysm  although progressive contrast accumulation is evident in the collection on the delayed scan consistent with continued active extravasation, possibly from pancreaticoduodenal arterial branch. Persistent  surrounding retroperitoneal inflammatory/edematous changes. No free air.  No ascites. Musculoskeletal: Small umbilical hernia containing mesenteric fat. Multilevel lumbar spondylitic changes. Bilateral hip DJD. No fracture or worrisome bone lesion. IMPRESSION: 1. Active extravasation into right retroperitoneal hematoma, which has slightly enlarged since earlier scan. No definite pseudoaneurysm or etiology of bleeding identified. Critical Value/emergent results were discussed by telephone at the time of interpretation on 04/19/2019 at 12:59 pm to with Dr. Trula Slade, who verbally acknowledged these results. Electronically Signed   By: Lucrezia Europe M.D.   On: 04/19/2019 13:00   US ABDOMEN LIMITED RUQ  Result Date: 04/19/2019 CLINICAL DATA:  Upper abdominal pain for 3 days. EXAM: ULTRASOUND ABDOMEN LIMITED RIGHT UPPER QUADRANT COMPARISON:  None. FINDINGS: Gallbladder: The gallbladder is distended without wall thickening, pericholecystic fluid, stone, sludge or Murphy's sign. Common bile duct: Diameter: 3.6 mm Liver: No focal lesion identified. Within normal limits in parenchymal echogenicity. Portal vein is patent on color Doppler imaging with normal direction of blood flow towards the liver. Other: There is a 9.3 x 7.2 x 5.2 cm rounded masslike collection with a fluid fluid level inferior to the liver and gallbladder. This does not definitely arise from the aorta or the right kidney. IMPRESSION: 1. There is a 9.3 x 7.2 x 5.2 cm rounded masslike collection with a fluid fluid level inferior to the liver and gallbladder. This masslike collection does not definitely arise from the aorta or right kidney. No internal blood flow. No correlate for this masslike collection was seen on the CT scan from January 2015. Recommend a CT scan of the abdomen and pelvis with intravenous and oral contrast. 2. The gallbladder is distended but otherwise unremarkable. No stones, wall thickening, pericholecystic fluid, or Murphy's sign.  Electronically Signed   By: Dorise Bullion III M.D   On: 04/19/2019 06:37    ASSESSMENT & PLAN:   Primary hypercoagulable state [facility]) # History of factor V Leiden [as reported per patient]; history of multiple DVTs-on indefinite Coumadin.  Patient currently off Coumadin [see below] in the recent retroperitoneal hematoma.   #Given history of multiple blood clots/DVTs-I think patient will need to go back on anticoagulation.  Will get labs including factor V Leiden/prothrombin mutation today.  However given the supratherapeutic INR/hematoma-I would recommend switching to Eliquis.   #Retroperitoneal hematoma 9 cm in size [risk factors-supratherapeutic anticoagulation-INR 4.5; fall].  Currently off Coumadin.  I would recommend repeating a CT scan abdomen pelvis in 3 weeks [about 6 weeks since diagnosis] to confirm resolution/improvement.  Thank you Dr.Scott for allowing me to participate in the care of your pleasant patient. Please do not hesitate to contact me with questions or concerns in the interim.  # DISPOSITION: # Labs today # follow up in 3 weeks- MD; No labs; CT A/P prior- Dr.B  All questions were answered. The patient knows to call the clinic with any problems, questions or concerns.    Tina Sickle, MD 05/13/2019 3:23 PM

## 2019-05-13 NOTE — Assessment & Plan Note (Addendum)
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  Patient currently off Coumadin [see below] in the recent retroperitoneal hematoma.   #Given history of multiple blood clots/DVTs-I think patient will need to go back on anticoagulation.  Will get labs including factor V Leiden/prothrombin mutation today.  However given the supratherapeutic INR/hematoma-I would recommend switching to Eliquis.   #Retroperitoneal hematoma 9 cm in size [risk factors-supratherapeutic anticoagulation-INR 4.5; fall].  Currently off Coumadin.  I would recommend repeating a CT scan abdomen pelvis in 3 weeks [about 6 weeks since diagnosis] to confirm resolution/improvement.  Thank you Dr.Scott for allowing me to participate in the care of your pleasant patient. Please do not hesitate to contact me with questions or concerns in the interim.  # DISPOSITION: # Labs today # follow up in 3 weeks- MD; No labs; CT A/P prior- Dr.B

## 2019-05-13 NOTE — Telephone Encounter (Signed)
Thank you.  Let me know if I need to do anything.   

## 2019-05-14 ENCOUNTER — Encounter: Payer: Self-pay | Admitting: Internal Medicine

## 2019-05-14 DIAGNOSIS — I10 Essential (primary) hypertension: Secondary | ICD-10-CM | POA: Diagnosis not present

## 2019-05-14 DIAGNOSIS — J449 Chronic obstructive pulmonary disease, unspecified: Secondary | ICD-10-CM | POA: Diagnosis not present

## 2019-05-14 DIAGNOSIS — D6832 Hemorrhagic disorder due to extrinsic circulating anticoagulants: Secondary | ICD-10-CM | POA: Diagnosis not present

## 2019-05-14 DIAGNOSIS — E785 Hyperlipidemia, unspecified: Secondary | ICD-10-CM | POA: Diagnosis not present

## 2019-05-14 DIAGNOSIS — T45515D Adverse effect of anticoagulants, subsequent encounter: Secondary | ICD-10-CM | POA: Diagnosis not present

## 2019-05-14 DIAGNOSIS — J9621 Acute and chronic respiratory failure with hypoxia: Secondary | ICD-10-CM | POA: Diagnosis not present

## 2019-05-14 DIAGNOSIS — K661 Hemoperitoneum: Secondary | ICD-10-CM | POA: Diagnosis not present

## 2019-05-14 DIAGNOSIS — E876 Hypokalemia: Secondary | ICD-10-CM | POA: Diagnosis not present

## 2019-05-14 DIAGNOSIS — D6851 Activated protein C resistance: Secondary | ICD-10-CM | POA: Diagnosis not present

## 2019-05-14 DIAGNOSIS — E7800 Pure hypercholesterolemia, unspecified: Secondary | ICD-10-CM | POA: Diagnosis not present

## 2019-05-14 NOTE — Telephone Encounter (Signed)
LMTCB. Need more info 

## 2019-05-14 NOTE — Progress Notes (Signed)
Hi [name]-your blood counts especially hemoglobin is much improved since your hospitalization.  We will plan to see her back after the CT scan-and to discuss about blood thinners at that time.  Please call us if any questions.  Thank you Dr.B

## 2019-05-15 DIAGNOSIS — E876 Hypokalemia: Secondary | ICD-10-CM | POA: Diagnosis not present

## 2019-05-15 DIAGNOSIS — D6832 Hemorrhagic disorder due to extrinsic circulating anticoagulants: Secondary | ICD-10-CM | POA: Diagnosis not present

## 2019-05-15 DIAGNOSIS — J9621 Acute and chronic respiratory failure with hypoxia: Secondary | ICD-10-CM | POA: Diagnosis not present

## 2019-05-15 DIAGNOSIS — E7800 Pure hypercholesterolemia, unspecified: Secondary | ICD-10-CM | POA: Diagnosis not present

## 2019-05-15 DIAGNOSIS — I10 Essential (primary) hypertension: Secondary | ICD-10-CM | POA: Diagnosis not present

## 2019-05-15 DIAGNOSIS — J449 Chronic obstructive pulmonary disease, unspecified: Secondary | ICD-10-CM | POA: Diagnosis not present

## 2019-05-15 DIAGNOSIS — T45515D Adverse effect of anticoagulants, subsequent encounter: Secondary | ICD-10-CM | POA: Diagnosis not present

## 2019-05-15 DIAGNOSIS — E785 Hyperlipidemia, unspecified: Secondary | ICD-10-CM | POA: Diagnosis not present

## 2019-05-15 DIAGNOSIS — K661 Hemoperitoneum: Secondary | ICD-10-CM | POA: Diagnosis not present

## 2019-05-15 DIAGNOSIS — D6851 Activated protein C resistance: Secondary | ICD-10-CM | POA: Diagnosis not present

## 2019-05-15 NOTE — Telephone Encounter (Signed)
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

## 2019-05-15 NOTE — Telephone Encounter (Signed)
Verbal given to Star as described previously.

## 2019-05-15 NOTE — Telephone Encounter (Signed)
Ok.  Let me know if any acute symptoms or problems.

## 2019-05-15 NOTE — Telephone Encounter (Signed)
Tina Duarte returned your call about verbal order

## 2019-05-16 LAB — PROTHROMBIN GENE MUTATION

## 2019-05-19 ENCOUNTER — Telehealth: Payer: Self-pay | Admitting: Internal Medicine

## 2019-05-19 DIAGNOSIS — I10 Essential (primary) hypertension: Secondary | ICD-10-CM | POA: Diagnosis not present

## 2019-05-19 DIAGNOSIS — E876 Hypokalemia: Secondary | ICD-10-CM | POA: Diagnosis not present

## 2019-05-19 DIAGNOSIS — E785 Hyperlipidemia, unspecified: Secondary | ICD-10-CM | POA: Diagnosis not present

## 2019-05-19 DIAGNOSIS — T45515D Adverse effect of anticoagulants, subsequent encounter: Secondary | ICD-10-CM | POA: Diagnosis not present

## 2019-05-19 DIAGNOSIS — J9621 Acute and chronic respiratory failure with hypoxia: Secondary | ICD-10-CM | POA: Diagnosis not present

## 2019-05-19 DIAGNOSIS — E7800 Pure hypercholesterolemia, unspecified: Secondary | ICD-10-CM | POA: Diagnosis not present

## 2019-05-19 DIAGNOSIS — K661 Hemoperitoneum: Secondary | ICD-10-CM | POA: Diagnosis not present

## 2019-05-19 DIAGNOSIS — J449 Chronic obstructive pulmonary disease, unspecified: Secondary | ICD-10-CM | POA: Diagnosis not present

## 2019-05-19 DIAGNOSIS — D6832 Hemorrhagic disorder due to extrinsic circulating anticoagulants: Secondary | ICD-10-CM | POA: Diagnosis not present

## 2019-05-19 DIAGNOSIS — D6851 Activated protein C resistance: Secondary | ICD-10-CM | POA: Diagnosis not present

## 2019-05-19 LAB — FACTOR 5 LEIDEN

## 2019-05-19 NOTE — Telephone Encounter (Signed)
Tina Duarte , OT, phone number [phone]. Patient declined destidular evaulation .Tina Duarte is requesting a 1week 1 for next week, waiting for equipment to arrive.

## 2019-05-20 ENCOUNTER — Ambulatory Visit (INDEPENDENT_AMBULATORY_CARE_PROVIDER_SITE_OTHER): Payer: Medicare Other | Admitting: Vascular Surgery

## 2019-05-20 ENCOUNTER — Encounter (INDEPENDENT_AMBULATORY_CARE_PROVIDER_SITE_OTHER): Payer: Self-pay | Admitting: Vascular Surgery

## 2019-05-20 ENCOUNTER — Other Ambulatory Visit: Payer: Self-pay

## 2019-05-20 VITALS — BP 154/86 | HR 99 | Resp 16 | Ht 64.0 in | Wt 220.8 lb

## 2019-05-20 DIAGNOSIS — M7989 Other specified soft tissue disorders: Secondary | ICD-10-CM

## 2019-05-20 DIAGNOSIS — D6851 Activated protein C resistance: Secondary | ICD-10-CM | POA: Diagnosis not present

## 2019-05-20 DIAGNOSIS — E785 Hyperlipidemia, unspecified: Secondary | ICD-10-CM | POA: Diagnosis not present

## 2019-05-20 DIAGNOSIS — J9621 Acute and chronic respiratory failure with hypoxia: Secondary | ICD-10-CM | POA: Diagnosis not present

## 2019-05-20 DIAGNOSIS — D6832 Hemorrhagic disorder due to extrinsic circulating anticoagulants: Secondary | ICD-10-CM | POA: Diagnosis not present

## 2019-05-20 DIAGNOSIS — K661 Hemoperitoneum: Secondary | ICD-10-CM

## 2019-05-20 DIAGNOSIS — E7800 Pure hypercholesterolemia, unspecified: Secondary | ICD-10-CM | POA: Diagnosis not present

## 2019-05-20 DIAGNOSIS — J449 Chronic obstructive pulmonary disease, unspecified: Secondary | ICD-10-CM | POA: Diagnosis not present

## 2019-05-20 DIAGNOSIS — E876 Hypokalemia: Secondary | ICD-10-CM | POA: Diagnosis not present

## 2019-05-20 DIAGNOSIS — T45515D Adverse effect of anticoagulants, subsequent encounter: Secondary | ICD-10-CM | POA: Diagnosis not present

## 2019-05-20 DIAGNOSIS — I10 Essential (primary) hypertension: Secondary | ICD-10-CM

## 2019-05-20 NOTE — Assessment & Plan Note (Signed)
This was a relatively large retroperitoneal hematoma and actually created IVC compression based off the CT scan which I have reviewed.  Now that that is better, her leg swelling has markedly improved and I think this had a large part to do with her significant leg swelling.

## 2019-05-20 NOTE — Telephone Encounter (Signed)
LMTCB

## 2019-05-20 NOTE — Patient Instructions (Signed)

## 2019-05-20 NOTE — Assessment & Plan Note (Signed)
Her swelling is a fair bit better now.  She has some chronic swelling although it is reasonably well controlled.  She had a recent retroperitoneal hematoma which was quite large. This was a relatively large retroperitoneal hematoma and actually created IVC compression based off the CT scan which I have reviewed.  Now that that is better, her leg swelling has markedly improved and I think this had a large part to do with her significant leg swelling.   I would recommend she continue wearing compression stockings and elevating her legs.  Increasing activity is also benefit.  I would offer her a venous reflux study to evaluate her venous system and determine if future options are helpful if her swelling recurs.  For now, she says she would like to call us if her swelling gets worse and that is certainly reasonable.

## 2019-05-20 NOTE — Assessment & Plan Note (Signed)
blood pressure control important in reducing the progression of atherosclerotic disease. On appropriate oral medications.  

## 2019-05-20 NOTE — Progress Notes (Signed)
[name] Card, female   DOB: 08-04-1945, 74 y.o.   MRN: [id number]  Chief Complaint  Patient presents with  . New Patient (Initial Visit)    ref Tina Duarte leg swelling    HPI Tina Duarte is a 74 y.o. female.  I am asked to see the patient by Dr. Nicki Reaper for evaluation of leg swelling.  Couple of months ago, she began having significant swelling in her legs right worse than left.  This coincided with a diagnosis of a retroperitoneal hematoma with 2 CT scans of the abdomen pelvis which I reviewed which showed a fairly large retroperitoneal hematoma.  On the CT scan, her inferior vena cava actually appears compressed from the retroperitoneal hematoma likely generating significant resistance to venous flow from her legs.  Her Coumadin level was supratherapeutic.  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  She is always had some chronic swelling in the legs although it is generally under good control.  With use of compression socks, elevating her legs, and being off anticoagulation her leg swelling is actually gotten a lot better and she is back near her baseline now.   Past Medical History:  Diagnosis Date  . Allergy   . Asthma   . Chicken pox   . History of blood clots   . Hypercholesterolemia   . Hypertension   . Retroperitoneal hematoma 03/2019    Past Surgical History:  Procedure Laterality Date  . blood clots  2008  . TUBAL LIGATION       Family History  Problem Relation Age of Onset  . Cancer Mother        Breast  . Heart disease Mother   . Stroke Mother   . Hypertension Mother   . Breast cancer Mother        66's  . Heart disease Father   . Stroke Father   . Hypertension Father   . Diabetes Father   . Colon cancer Other        paternal cousin      Social History   Tobacco Use  . Smoking status: Never Smoker  . Smokeless tobacco: Never Used  Substance Use Topics  . Alcohol use: No    Alcohol/week: 0.0 standard drinks  . Drug  use: No     No Known Allergies  Current Outpatient Medications  Medication Sig Dispense Refill  . acetaminophen (TYLENOL) 500 MG tablet Take 1,500 mg by mouth daily as needed for headache (pain).    Marland Kitchen albuterol (ACCUNEB) 0.63 MG/3ML nebulizer solution Take 3 mLs (0.63 mg total) by nebulization every 6 (six) hours as needed for wheezing. 75 mL 12  . budesonide (PULMICORT) 0.5 MG/2ML nebulizer solution Take 2 mLs by nebulization 2 (two) times daily as needed (shortness of breath).     . citalopram (CELEXA) 20 MG tablet Take 1 tablet (20 mg total) by mouth daily. (Patient taking differently: Take 20 mg by mouth daily with lunch. ) 90 tablet 1  . guaiFENesin (MUCINEX) 600 MG 12 hr tablet Take 1 tablet (600 mg total) by mouth 2 (two) times daily. 10 tablet 0  . losartan (COZAAR) 50 MG tablet TAKE 1 TABLET BY MOUTH ONCE A DAY (Patient taking differently: Take 50 mg by mouth daily with lunch. ) 90 tablet 1  . rosuvastatin (CRESTOR) 5 MG tablet Take 1 tablet (5 mg total) by mouth daily. (Patient taking differently: Take 5 mg by mouth daily with lunch. )  90 tablet 1  . senna-docusate (SENOKOT-S) 8.6-50 MG tablet Take 1 tablet by mouth at bedtime. 30 tablet 0   No current facility-administered medications for this visit.      REVIEW OF SYSTEMS (Negative unless checked)  Constitutional: [] Weight loss  [] Fever  [] Chills Cardiac: [] Chest pain   [] Chest pressure   [] Palpitations   [] Shortness of breath when laying flat   [] Shortness of breath at rest   [] Shortness of breath with exertion. Vascular:  [] Pain in legs with walking   [] Pain in legs at rest   [] Pain in legs when laying flat   [] Claudication   [] Pain in feet when walking  [] Pain in feet at rest  [] Pain in feet when laying flat   [x] History of DVT   [x] Phlebitis   [x] Swelling in legs   [] Varicose veins   [] Non-healing ulcers Pulmonary:   [] Uses home oxygen   [] Productive cough   [] Hemoptysis   [] Wheeze  [] COPD   [x] Asthma Neurologic:   [] Dizziness  [] Blackouts   [] Seizures   [] History of stroke   [] History of TIA  [] Aphasia   [] Temporary blindness   [] Dysphagia   [] Weakness or numbness in arms   [] Weakness or numbness in legs Musculoskeletal:  [] Arthritis   [] Joint swelling   [] Joint pain   [] Low back pain Hematologic:  [] Easy bruising  [] Easy bleeding   [] Hypercoagulable state   [] Anemic  [] Hepatitis Gastrointestinal:  [] Blood in stool   [] Vomiting blood  [] Gastroesophageal reflux/heartburn   [x] Abdominal pain Genitourinary:  [] Chronic kidney disease   [] Difficult urination  [] Frequent urination  [] Burning with urination   [] Hematuria Skin:  [] Rashes   [] Ulcers   [] Wounds Psychological:  [] History of anxiety   []  History of major depression.    Physical Exam BP (!) 154/86 (BP Location: Right Arm)   Pulse 99   Resp 16   Ht 5\' 4"  (1.626 m)   Wt 220 lb 12.8 oz (100.2 kg)   LMP 04/29/1997   BMI 37.90 kg/m  Gen:  WD/WN, NAD. Appears younger than stated age. Head: [place]/AT, No temporalis wasting.  Ear/Nose/Throat: Hearing grossly intact, nares w/o erythema or drainage, oropharynx w/o Erythema/Exudate Eyes: Conjunctiva clear, sclera non-icteric  Neck: trachea midline.  No JVD.  Pulmonary:  Good air movement, respirations not labored, no use of accessory muscles  Cardiac: irregular Vascular:  Vessel Right Left  Radial Palpable Palpable                          DP 1+ 1+  PT 1+ 1+    Musculoskeletal: M/S 5/5 throughout.  Extremities without ischemic changes.  No deformity or atrophy. Mild BLE edema. Neurologic: Sensation grossly intact in extremities.  Symmetrical.  Speech is fluent. Motor exam as listed above. Psychiatric: Judgment intact, Mood & affect appropriate for pt's clinical situation. Dermatologic: No rashes or ulcers noted.  No cellulitis or open wounds.    Radiology CT ABDOMEN PELVIS W CONTRAST  Result Date: 04/26/2019 CLINICAL DATA:  Retroperitoneal hematoma, abdominal pain, improving  supratherapeutic INR EXAM: CT ABDOMEN AND PELVIS WITH CONTRAST TECHNIQUE: Multidetector CT imaging of the abdomen and pelvis was performed using the standard protocol following bolus administration of intravenous contrast. CONTRAST:  151mL OMNIPAQUE IOHEXOL 300 MG/ML  SOLN COMPARISON:  04/19/2019 FINDINGS: Lower chest: No acute pleural or parenchymal lung disease. Hepatobiliary: High density material within the gallbladder likely vicarious excretion of contrast versus gallbladder sludge. This is new since prior study. The liver is unremarkable. Pancreas:  Unremarkable. No pancreatic ductal dilatation or surrounding inflammatory changes. Spleen: Normal in size without focal abnormality. Adrenals/Urinary Tract: Bilateral renal cortical thinning. No urinary tract calculi or obstruction. The bladder is unremarkable. The adrenals are normal. Stomach/Bowel: No bowel obstruction or ileus. Normal appendix right lower quadrant. No bowel wall thickening or inflammatory changes. Vascular/Lymphatic: Extensive atherosclerosis of the abdominal aorta unchanged. No pathologic adenopathy. Reproductive: Uterus and bilateral adnexa are unremarkable. Other: There is a persistent retroperitoneal hematoma along the posterior wall of the duodenal sweep. Hematoma now measures approximately 5.8 x 12.5 cm in transverse dimension, and extends approximately 10.5 cm in craniocaudal length. Overall, minimal interval enlargement. No evidence of active contrast extravasation on this study. Trace fluid extends in the retroperitoneum inferiorly along the iliac vessels. No new areas of hemorrhage. No free intraperitoneal fluid or free gas. Musculoskeletal: No acute or destructive bony lesions. Reconstructed images demonstrate no additional findings. IMPRESSION: 1. Slight interval enlargement of the retroperitoneal hematoma along the dorsal aspect of the duodenal sweep. No evidence of contrast extravasation. 2. Likely vicarious excretion of contrast  within the gallbladder. No evidence of cholelithiasis or cholecystitis. Electronically Signed   By: Randa Ngo M.D.   On: 04/26/2019 16:45   DG CHEST PORT 1 VIEW  Result Date: 04/27/2019 CLINICAL DATA:  Shortness of breath EXAM: PORTABLE CHEST 1 VIEW COMPARISON:  April 26, 2019 FINDINGS: The aorta is tortuous. The heart size is enlarged. The lungs are clear. No focal infiltrate, pulmonary edema, or pleural effusion is noted. Bony structures are stable. IMPRESSION: No active cardiopulmonary disease. Electronically Signed   By: Abelardo Diesel M.D.   On: 04/27/2019 08:18   DG CHEST PORT 1 VIEW  Result Date: 04/26/2019 CLINICAL DATA:  Hypoxia. EXAM: PORTABLE CHEST 1 VIEW COMPARISON:  April 19, 2019 FINDINGS: The aorta is tortuous. Heart size is enlarged. Minimal pleural effusion is identified in the right hemithorax. The left lung is clear. Bony structures are stable. IMPRESSION: Minimal right pleural effusion. Electronically Signed   By: Abelardo Diesel M.D.   On: 04/26/2019 13:10   DG HIP UNILAT WITH PELVIS 2-3 VIEWS RIGHT  Result Date: 04/24/2019 CLINICAL DATA:  Pain EXAM: DG HIP (WITH OR WITHOUT PELVIS) 2-3V RIGHT COMPARISON:  None. FINDINGS: Frontal pelvis as well as frontal and lateral right hip images obtained. Bones are osteoporotic. No fracture or dislocation. There is mild symmetric narrowing of each hip joint. No evident erosive change. Sacroiliac joints bilaterally appear unremarkable. IMPRESSION: Mild symmetric narrowing of each hip joint. No fracture or dislocation. No erosive change. Bones osteoporotic. Electronically Signed   By: Lowella Grip III M.D.   On: 04/24/2019 11:07    Labs Recent Results (from the past 2160 hour(s))  Protime-INR     Status: Abnormal   Collection Time: 03/18/19  3:21 PM  Result Value Ref Range   INR 2.8 (H)     Comment: Reference Range                     0.9-1.1 Moderate-intensity Warfarin Therapy 2.0-3.0 Higher-intensity Warfarin Therapy   3.0-4.0    .    Prothrombin Time 27.3 (H) 9.0 - 11.5 sec    Comment: For additional information, please refer to http://education.questdiagnostics.com/faq/FAQ104 (This link is being provided for informational/ educational purposes only.)   Basic metabolic panel     Status: Abnormal   Collection Time: 03/18/19  3:21 PM  Result Value Ref Range   Sodium 140 135 - 145 mEq/L   Potassium 3.6 3.5 -  5.1 mEq/L   Chloride 103 96 - 112 mEq/L   CO2 28 19 - 32 mEq/L   Glucose, Bld 85 70 - 99 mg/dL   BUN 13 6 - 23 mg/dL   Creatinine, Ser 0.99 0.40 - 1.20 mg/dL   GFR 54.62 (L) >60.00 mL/min   Calcium 8.8 8.4 - 10.5 mg/dL  TSH     Status: None   Collection Time: 03/18/19  3:21 PM  Result Value Ref Range   TSH 3.78 0.35 - 4.50 uIU/mL  Hemoglobin A1c     Status: None   Collection Time: 03/18/19  3:21 PM  Result Value Ref Range   Hgb A1c MFr Bld 6.3 4.6 - 6.5 %    Comment: Glycemic Control Guidelines for People with Diabetes:Non Diabetic:  <6%Goal of Therapy: <7%Additional Action Suggested:  >8%   Hepatic function panel     Status: None   Collection Time: 03/18/19  3:21 PM  Result Value Ref Range   Total Bilirubin 0.9 0.2 - 1.2 mg/dL   Bilirubin, Direct 0.2 0.0 - 0.3 mg/dL   Alkaline Phosphatase 58 39 - 117 U/L   AST 24 0 - 37 U/L   ALT 15 0 - 35 U/L   Total Protein 6.5 6.0 - 8.3 g/dL   Albumin 4.1 3.5 - 5.2 g/dL  CBC with Differential/Platelet     Status: None   Collection Time: 03/18/19  3:21 PM  Result Value Ref Range   WBC 7.2 4.0 - 10.5 K/uL   RBC 4.72 3.87 - 5.11 Mil/uL   Hemoglobin 13.7 12.0 - 15.0 g/dL   HCT 41.5 36.0 - 46.0 %   MCV 87.8 78.0 - 100.0 fl   MCHC 33.0 30.0 - 36.0 g/dL   RDW 14.1 11.5 - 15.5 %   Platelets 276.0 150.0 - 400.0 K/uL   Neutrophils Relative % 57.1 43.0 - 77.0 %   Lymphocytes Relative 31.5 12.0 - 46.0 %   Monocytes Relative 5.8 3.0 - 12.0 %   Eosinophils Relative 4.7 0.0 - 5.0 %   Basophils Relative 0.9 0.0 - 3.0 %   Neutro Abs 4.1 1.4 - 7.7 K/uL   Lymphs  Abs 2.3 0.7 - 4.0 K/uL   Monocytes Absolute 0.4 0.1 - 1.0 K/uL   Eosinophils Absolute 0.3 0.0 - 0.7 K/uL   Basophils Absolute 0.1 0.0 - 0.1 K/uL  Lipase, blood     Status: None   Collection Time: 04/19/19 12:49 AM  Result Value Ref Range   Lipase 23 11 - 51 U/L    Comment: Performed at [facility], [address] [place], [place] [address]  Comprehensive metabolic panel     Status: Abnormal   Collection Time: 04/19/19 12:49 AM  Result Value Ref Range   Sodium 139 135 - 145 mmol/L   Potassium 3.2 (L) 3.5 - 5.1 mmol/L   Chloride 101 98 - 111 mmol/L   CO2 26 22 - 32 mmol/L   Glucose, Bld 137 (H) 70 - 99 mg/dL    Comment: Glucose reference range applies only to samples taken after fasting for at least 8 hours.   BUN 7 (L) 8 - 23 mg/dL   Creatinine, Ser 0.88 0.44 - 1.00 mg/dL   Calcium 8.8 (L) 8.9 - 10.3 mg/dL   Total Protein 7.5 6.5 - 8.1 g/dL   Albumin 4.2 3.5 - 5.0 g/dL   AST 19 15 - 41 U/L   ALT 11 0 - 44 U/L   Alkaline Phosphatase 69 38 -  126 U/L   Total Bilirubin 1.4 (H) 0.3 - 1.2 mg/dL   GFR calc non Af Amer >60 >60 mL/min   GFR calc Af Amer >60 >60 mL/min   Anion gap 12 5 - 15    Comment: Performed at [facility], [address] [place], [place] [address]  CBC     Status: None   Collection Time: 04/19/19 12:49 AM  Result Value Ref Range   WBC 8.7 4.0 - 10.5 K/uL   RBC 4.86 3.87 - 5.11 MIL/uL   Hemoglobin 13.7 12.0 - 15.0 g/dL   HCT 42.7 36.0 - 46.0 %   MCV 87.9 80.0 - 100.0 fL   MCH 28.2 26.0 - 34.0 pg   MCHC 32.1 30.0 - 36.0 g/dL   RDW 12.9 11.5 - 15.5 %   Platelets 285 150 - 400 K/uL   nRBC 0.0 0.0 - 0.2 %    Comment: Performed at [facility], [address] [place], [place] [address]  Urinalysis, Complete w Microscopic     Status: Abnormal   Collection Time: 04/19/19 12:49 AM  Result Value Ref Range   Color, Urine YELLOW (A) YELLOW   APPearance CLEAR (A) CLEAR   Specific Gravity, Urine 1.011 1.005 - 1.030   pH 7.0 5.0 - 8.0     Glucose, UA NEGATIVE NEGATIVE mg/dL   Hgb urine dipstick MODERATE (A) NEGATIVE   Bilirubin Urine NEGATIVE NEGATIVE   Ketones, ur 5 (A) NEGATIVE mg/dL   Protein, ur NEGATIVE NEGATIVE mg/dL   Nitrite NEGATIVE NEGATIVE   Leukocytes,Ua TRACE (A) NEGATIVE   RBC / HPF 21-50 0 - 5 RBC/hpf   WBC, UA 0-5 0 - 5 WBC/hpf   Bacteria, UA NONE SEEN NONE SEEN   Squamous Epithelial / LPF 0-5 0 - 5   Mucus PRESENT     Comment: Performed at [facility], [place], [place] [address]  Troponin I (High Sensitivity)     Status: Abnormal   Collection Time: 04/19/19 12:49 AM  Result Value Ref Range   Troponin I (High Sensitivity) 23 (H) <18 ng/L    Comment: (NOTE) Elevated high sensitivity troponin I (hsTnI) values and significant  changes across serial measurements may suggest ACS but many other  chronic and acute conditions are known to elevate hsTnI results.  Refer to the "Links" section for chest pain algorithms and additional  guidance. Performed at [facility], [place], [place] [address]   Troponin I (High Sensitivity)     Status: Abnormal   Collection Time: 04/19/19  4:03 AM  Result Value Ref Range   Troponin I (High Sensitivity) 23 (H) <18 ng/L    Comment: (NOTE) Elevated high sensitivity troponin I (hsTnI) values and significant  changes across serial measurements may suggest ACS but many other  chronic and acute conditions are known to elevate hsTnI results.  Refer to the "Links" section for chest pain algorithms and additional  guidance. Performed at [facility], [address] [place], [place] [address]   Protime-INR     Status: Abnormal   Collection Time: 04/19/19  5:14 AM  Result Value Ref Range   Prothrombin Time 44.7 (H) 11.4 - 15.2 seconds   INR 4.8 (HH) 0.8 - 1.2    Comment: RESULT REPEATED AND VERIFIED CRITICAL RESULT CALLED TO, READ BACK BY AND VERIFIED WITH: KATE BUCKNUM AT [id number] 04/19/19.PMF (NOTE) INR goal  varies based on device and disease states. Performed at [facility], [place], [place] [address]   Respiratory Panel by RT PCR (Flu A&B, Covid) - Nasopharyngeal Swab     Status: None   Collection Time: 04/19/19  7:29 AM   Specimen: Nasopharyngeal Swab  Result Value Ref Range   SARS Coronavirus 2 by RT PCR NEGATIVE NEGATIVE    Comment: (NOTE) SARS-CoV-2 target nucleic acids are NOT DETECTED. The SARS-CoV-2 RNA is generally detectable in upper respiratoy specimens during the acute phase of infection. The lowest concentration of SARS-CoV-2 viral copies this assay can detect is 131 copies/mL. A negative result does not preclude SARS-Cov-2 infection and should not be used as the sole basis for treatment or other patient management decisions. A negative result may occur with  improper specimen collection/handling, submission of specimen other than nasopharyngeal swab, presence of viral mutation(s) within the areas targeted by this assay, and inadequate number of viral copies (<131 copies/mL). A negative result must be combined with clinical observations, patient history, and epidemiological information. The expected result is Negative. Fact Sheet for Patients:  PinkCheek.be Fact Sheet for Healthcare Providers:  GravelBags.it This test is not yet ap proved or cleared by the [place] FDA and  has been authorized for detection and/or diagnosis of SARS-CoV-2 by FDA under an Emergency Use Authorization (EUA). This EUA will remain  in effect (meaning this test can be used) for the duration of the COVID-19 declaration under Section 564(b)(1) of the Act, 21 U.S.C. section 360bbb-3(b)(1), unless the authorization is terminated or revoked sooner.    Influenza A by PCR NEGATIVE NEGATIVE   Influenza B by PCR NEGATIVE NEGATIVE    Comment: (NOTE) The Xpert Xpress SARS-CoV-2/FLU/RSV assay is intended as an aid in   the diagnosis of influenza from Nasopharyngeal swab specimens and  should not be used as a sole basis for treatment. Nasal washings and  aspirates are unacceptable for Xpert Xpress SARS-CoV-2/FLU/RSV  testing. Fact Sheet for Patients: PinkCheek.be Fact Sheet for Healthcare Providers: GravelBags.it This test is not yet approved or cleared by the [place] FDA and  has been authorized for detection and/or diagnosis of SARS-CoV-2 by  FDA under an Emergency Use Authorization (EUA). This EUA will remain  in effect (meaning this test can be used) for the duration of the  Covid-19 declaration under Section 564(b)(1) of the Act, 21  U.S.C. section 360bbb-3(b)(1), unless the authorization is  terminated or revoked. Performed at [facility], [address] [place], [place] [address]   Prepare fresh frozen plasma     Status: None   Collection Time: 04/19/19 10:30 AM  Result Value Ref Range   Unit Number [id number]    Blood Component Type THAWED PLASMA    Unit division 00    Status of Unit ISSUED,FINAL    Transfusion Status      OK TO TRANSFUSE Performed at [facility], [place], [place] [address]   BPAM FFP     Status: None   Collection Time: 04/19/19 10:30 AM  Result Value Ref Range   ISSUE DATE / TIME [id number]    Blood Product Unit Number [id number]    PRODUCT CODE [id number]    Unit Type and Rh 6200    Blood Product Expiration Date [id number]   CBC     Status: Abnormal   Collection Time: 04/19/19 10:32 AM  Result Value Ref Range   WBC 12.6 (H) 4.0 - 10.5 K/uL   RBC 4.66 3.87 - 5.11 MIL/uL   Hemoglobin 13.3  12.0 - 15.0 g/dL   HCT 41.5 36.0 - 46.0 %   MCV 89.1 80.0 - 100.0 fL   MCH 28.5 26.0 - 34.0 pg   MCHC 32.0 30.0 - 36.0 g/dL   RDW 13.1 11.5 - 15.5 %   Platelets 270 150 - 400 K/uL   nRBC 0.0 0.0 - 0.2 %    Comment: Performed at [facility], [address] [place], [place] [address]  Type and screen [facility]     Status: None   Collection Time: 04/19/19 10:32 AM  Result Value Ref Range   ABO/RH(D) A POS    Antibody Screen NEG    Sample Expiration      04/22/2019,2359 Performed at [facility], [address] [place], [place] [address]   Prepare fresh frozen plasma     Status: None   Collection Time: 04/19/19  2:00 PM  Result Value Ref Range   Unit Number [id number]    Blood Component Type THAWED PLASMA    Unit division 00    Status of Unit ISSUED,FINAL    Transfusion Status      OK TO TRANSFUSE Performed at [facility], [address] [place], [place] [address]   BPAM FFP     Status: None   Collection Time: 04/19/19  2:00 PM  Result Value Ref Range   ISSUE DATE / TIME [id number]    Blood Product Unit Number [id number]    PRODUCT CODE [id number]    Unit Type and Rh [id number]    Blood Product Expiration Date [id number]   Type and screen [place]     Status: None   Collection Time: 04/19/19  6:06 PM  Result Value Ref Range   ABO/RH(D) A POS    Antibody Screen NEG    Sample Expiration      04/22/2019,2359 Performed at [facility], [place] [address], [place], [place] [address]   ABO/Rh     Status: None   Collection Time: 04/19/19  6:06 PM  Result Value Ref Range   ABO/RH(D)      A POS Performed at [facility] [address], [place], [place] [address]   Comprehensive metabolic panel     Status: Abnormal   Collection Time: 04/19/19  6:46 PM  Result Value Ref Range   Sodium 136 135 - 145 mmol/L   Potassium 3.7 3.5 - 5.1 mmol/L   Chloride 93 (L) 98 - 111 mmol/L   CO2 27 22 - 32 mmol/L   Glucose, Bld 150 (H) 70 - 99 mg/dL    Comment: Glucose reference range applies only to samples taken after fasting for at least 8 hours.   BUN 7 (L) 8 - 23 mg/dL   Creatinine, Ser 0.95 0.44 - 1.00 mg/dL   Calcium 8.8 (L) 8.9 - 10.3 mg/dL   Total Protein  7.1 6.5 - 8.1 g/dL   Albumin 4.0 3.5 - 5.0 g/dL   AST 29 15 - 41 U/L   ALT 18 0 - 44 U/L   Alkaline Phosphatase 63 38 - 126 U/L   Total Bilirubin 2.8 (H) 0.3 - 1.2 mg/dL   GFR calc non Af Amer 59 (L) >60 mL/min   GFR calc Af Amer >60 >60 mL/min   Anion gap 16 (H) 5 - 15    Comment: Performed at [facility] [address] [place], [place] [address]  Magnesium     Status:  Abnormal   Collection Time: 04/19/19  6:46 PM  Result Value Ref Range   Magnesium 1.4 (L) 1.7 - 2.4 mg/dL    Comment: Performed at [facility] [address] [place], [place] [address]  Protime-INR     Status: Abnormal   Collection Time: 04/19/19  6:46 PM  Result Value Ref Range   Prothrombin Time 15.3 (H) 11.4 - 15.2 seconds   INR 1.2 0.8 - 1.2    Comment: (NOTE) INR goal varies based on device and disease states. Performed at [facility], [place] [address], [place], [place] [address]   CBC     Status: Abnormal   Collection Time: 04/19/19  6:46 PM  Result Value Ref Range   WBC 12.3 (H) 4.0 - 10.5 K/uL   RBC 4.17 3.87 - 5.11 MIL/uL   Hemoglobin 12.1 12.0 - 15.0 g/dL   HCT 35.9 (L) 36.0 - 46.0 %   MCV 86.1 80.0 - 100.0 fL   MCH 29.0 26.0 - 34.0 pg   MCHC 33.7 30.0 - 36.0 g/dL   RDW 13.0 11.5 - 15.5 %   Platelets 305 150 - 400 K/uL   nRBC 0.0 0.0 - 0.2 %    Comment: Performed at [facility], [place] [address] [place], [place] [address]  Hemoglobin and hematocrit, blood     Status: Abnormal   Collection Time: 04/19/19 10:05 PM  Result Value Ref Range   Hemoglobin 11.5 (L) 12.0 - 15.0 g/dL   HCT 35.6 (L) 36.0 - 46.0 %    Comment: Performed at [facility], [place] [address] [place], [place] [address]  Protime-INR     Status: Abnormal   Collection Time: 04/20/19  4:33 AM  Result Value Ref Range   Prothrombin Time 15.4 (H) 11.4 - 15.2 seconds   INR 1.2 0.8 - 1.2    Comment: (NOTE) INR goal varies based on device and disease states. Performed at [facility], [place] [address] [place], [place] [address]   Comprehensive metabolic panel     Status: Abnormal   Collection Time: 04/20/19  4:33 AM  Result Value Ref Range   Sodium 137 135 - 145 mmol/L   Potassium 3.5 3.5 - 5.1 mmol/L   Chloride 96 (L) 98 - 111 mmol/L   CO2 27 22 - 32 mmol/L   Glucose, Bld 126 (H) 70 - 99 mg/dL    Comment: Glucose reference range applies only to samples taken after fasting for at least 8 hours.   BUN 8 8 - 23 mg/dL   Creatinine, Ser 0.89 0.44 - 1.00 mg/dL   Calcium 8.6 (L) 8.9 - 10.3 mg/dL   Total Protein 6.7 6.5 - 8.1 g/dL   Albumin 3.5 3.5 - 5.0 g/dL   AST 21 15 - 41 U/L   ALT 13 0 - 44 U/L   Alkaline Phosphatase 53 38 - 126 U/L   Total Bilirubin 2.1 (H) 0.3 - 1.2 mg/dL   GFR calc non Af Amer >60 >60 mL/min   GFR calc Af Amer >60 >60 mL/min   Anion gap 14 5 - 15    Comment: Performed at [facility], [place] [address] [place] [address]  CBC     Status: Abnormal   Collection Time: 04/20/19  4:33 AM  Result Value Ref Range   WBC 12.5 (H) 4.0 - 10.5 K/uL   RBC 3.87 3.87 - 5.11 MIL/uL   Hemoglobin 11.3 (  L) 12.0 - 15.0 g/dL   HCT 33.8 (L) 36.0 - 46.0 %   MCV 87.3 80.0 - 100.0 fL   MCH 29.2 26.0 - 34.0 pg   MCHC 33.4 30.0 - 36.0 g/dL   RDW 13.1 11.5 - 15.5 %   Platelets 258 150 - 400 K/uL   nRBC 0.0 0.0 - 0.2 %    Comment: Performed at [facility] [address] [place], [place] [address]  Hemoglobin and hematocrit, blood     Status: Abnormal   Collection Time: 04/20/19 10:37 AM  Result Value Ref Range   Hemoglobin 10.5 (L) 12.0 - 15.0 g/dL   HCT 32.9 (L) 36.0 - 46.0 %    Comment: Performed at [facility], [place] [address], [place], [place] [address]  Lipase, blood     Status: None   Collection Time: 04/20/19 10:37 AM  Result Value Ref Range   Lipase 21 11 - 51 U/L    Comment: Performed at [facility] [address] [place], [place] [address]  Amylase     Status: None   Collection Time: 04/20/19 10:37 AM  Result Value Ref  Range   Amylase 34 28 - 100 U/L    Comment: Performed at [facility], [place] [address] [place], [place] [address]  Hemoglobin and hematocrit, blood     Status: Abnormal   Collection Time: 04/20/19  3:32 PM  Result Value Ref Range   Hemoglobin 10.8 (L) 12.0 - 15.0 g/dL   HCT 33.8 (L) 36.0 - 46.0 %    Comment: Performed at [facility], [place] [address], [place], [place] [address]  Hemoglobin and hematocrit, blood     Status: Abnormal   Collection Time: 04/20/19  9:39 PM  Result Value Ref Range   Hemoglobin 9.9 (L) 12.0 - 15.0 g/dL   HCT 31.2 (L) 36.0 - 46.0 %    Comment: Performed at [facility], [place] [address], [place], [place] [address]  Magnesium     Status: None   Collection Time: 04/21/19  4:02 AM  Result Value Ref Range   Magnesium 2.0 1.7 - 2.4 mg/dL    Comment: Performed at [facility] [address], [place], [place] [address]  CBC with Differential/Platelet     Status: Abnormal   Collection Time: 04/21/19  4:02 AM  Result Value Ref Range   WBC 7.3 4.0 - 10.5 K/uL   RBC 3.54 (L) 3.87 - 5.11 MIL/uL   Hemoglobin 10.0 (L) 12.0 - 15.0 g/dL   HCT 31.7 (L) 36.0 - 46.0 %   MCV 89.5 80.0 - 100.0 fL   MCH 28.2 26.0 - 34.0 pg   MCHC 31.5 30.0 - 36.0 g/dL   RDW 12.8 11.5 - 15.5 %   Platelets 199 150 - 400 K/uL   nRBC 0.0 0.0 - 0.2 %   Neutrophils Relative % 66 %   Neutro Abs 4.8 1.7 - 7.7 K/uL   Lymphocytes Relative 20 %   Lymphs Abs 1.5 0.7 - 4.0 K/uL   Monocytes Relative 10 %   Monocytes Absolute 0.7 0.1 - 1.0 K/uL   Eosinophils Relative 3 %   Eosinophils Absolute 0.2 0.0 - 0.5 K/uL   Basophils Relative 1 %   Basophils Absolute 0.0 0.0 - 0.1 K/uL   Immature Granulocytes 0 %   Abs Immature Granulocytes 0.03 0.00 - 0.07 K/uL    Comment: Performed at [facility], [address] [place], [place] [address]  Comprehensive metabolic panel     Status: Abnormal   Collection Time: 04/21/19  4:02 AM  Result Value Ref Range   Sodium 139 135 - 145  mmol/L   Potassium 3.1 (L) 3.5 - 5.1 mmol/L   Chloride 100 98 - 111 mmol/L   CO2 27 22 - 32 mmol/L   Glucose, Bld 97 70 - 99 mg/dL    Comment: Glucose reference range applies only to samples taken after fasting for at least 8 hours.   BUN 9 8 - 23 mg/dL   Creatinine, Ser 0.82 0.44 - 1.00 mg/dL   Calcium 8.2 (L) 8.9 - 10.3 mg/dL   Total Protein 5.9 (L) 6.5 - 8.1 g/dL   Albumin 3.1 (L) 3.5 - 5.0 g/dL   AST 21 15 - 41 U/L   ALT 13 0 - 44 U/L   Alkaline Phosphatase 47 38 - 126 U/L   Total Bilirubin 2.0 (H) 0.3 - 1.2 mg/dL   GFR calc non Af Amer >60 >60 mL/min   GFR calc Af Amer >60 >60 mL/min   Anion gap 12 5 - 15    Comment: Performed at [facility] [address], [place], [place] [address]  Protime-INR     Status: None   Collection Time: 04/21/19  4:02 AM  Result Value Ref Range   Prothrombin Time 15.2 11.4 - 15.2 seconds   INR 1.2 0.8 - 1.2    Comment: (NOTE) INR goal varies based on device and disease states. Performed at [facility], [place] [address], [place], [place] [address]   Hemoglobin and hematocrit, blood     Status: Abnormal   Collection Time: 04/21/19  9:19 AM  Result Value Ref Range   Hemoglobin 9.9 (L) 12.0 - 15.0 g/dL   HCT 31.2 (L) 36.0 - 46.0 %    Comment: Performed at [facility], [place] [address], [place], [place] [address]  Hemoglobin and hematocrit, blood     Status: Abnormal   Collection Time: 04/21/19  3:54 PM  Result Value Ref Range   Hemoglobin 10.0 (L) 12.0 - 15.0 g/dL   HCT 31.8 (L) 36.0 - 46.0 %    Comment: Performed at [facility], [place] [address], [place], [place] [address]  CK     Status: Abnormal   Collection Time: 04/21/19  3:54 PM  Result Value Ref Range   Total CK 473 (H) 38 - 234 U/L    Comment: Performed at [facility], [place] [address], [place], [place] [address]  Hemoglobin and hematocrit, blood     Status: Abnormal   Collection Time: 04/21/19  9:29 PM  Result Value Ref Range   Hemoglobin 10.0 (L) 12.0  - 15.0 g/dL   HCT 31.2 (L) 36.0 - 46.0 %    Comment: Performed at [facility], [place] [address], [place], [place] [address]  CBC with Differential/Platelet     Status: Abnormal   Collection Time: 04/22/19  3:11 AM  Result Value Ref Range   WBC 6.8 4.0 - 10.5 K/uL   RBC 3.19 (L) 3.87 - 5.11 MIL/uL   Hemoglobin 9.2 (L) 12.0 - 15.0 g/dL   HCT 29.0 (L) 36.0 - 46.0 %   MCV 90.9 80.0 - 100.0 fL   MCH 28.8 26.0 - 34.0 pg   MCHC 31.7 30.0 - 36.0 g/dL   RDW 12.6 11.5 - 15.5 %  Platelets 195 150 - 400 K/uL   nRBC 0.0 0.0 - 0.2 %   Neutrophils Relative % 59 %   Neutro Abs 4.0 1.7 - 7.7 K/uL   Lymphocytes Relative 26 %   Lymphs Abs 1.8 0.7 - 4.0 K/uL   Monocytes Relative 10 %   Monocytes Absolute 0.7 0.1 - 1.0 K/uL   Eosinophils Relative 4 %   Eosinophils Absolute 0.3 0.0 - 0.5 K/uL   Basophils Relative 1 %   Basophils Absolute 0.0 0.0 - 0.1 K/uL   Immature Granulocytes 0 %   Abs Immature Granulocytes 0.03 0.00 - 0.07 K/uL    Comment: Performed at [facility] [address], [place], [place] [address]  Comprehensive metabolic panel     Status: Abnormal   Collection Time: 04/22/19  3:11 AM  Result Value Ref Range   Sodium 136 135 - 145 mmol/L   Potassium 3.7 3.5 - 5.1 mmol/L   Chloride 101 98 - 111 mmol/L   CO2 29 22 - 32 mmol/L   Glucose, Bld 99 70 - 99 mg/dL    Comment: Glucose reference range applies only to samples taken after fasting for at least 8 hours.   BUN 8 8 - 23 mg/dL   Creatinine, Ser 0.81 0.44 - 1.00 mg/dL   Calcium 7.9 (L) 8.9 - 10.3 mg/dL   Total Protein 5.7 (L) 6.5 - 8.1 g/dL   Albumin 2.8 (L) 3.5 - 5.0 g/dL   AST 19 15 - 41 U/L   ALT 13 0 - 44 U/L   Alkaline Phosphatase 41 38 - 126 U/L   Total Bilirubin 1.7 (H) 0.3 - 1.2 mg/dL   GFR calc non Af Amer >60 >60 mL/min   GFR calc Af Amer >60 >60 mL/min   Anion gap 6 5 - 15    Comment: Performed at [facility] [address] [place], [place] [address]  Magnesium     Status: None   Collection  Time: 04/22/19  3:11 AM  Result Value Ref Range   Magnesium 1.8 1.7 - 2.4 mg/dL    Comment: Performed at [facility] [address], [place], [place] [address]  Hemoglobin and hematocrit, blood     Status: Abnormal   Collection Time: 04/22/19 10:29 AM  Result Value Ref Range   Hemoglobin 9.7 (L) 12.0 - 15.0 g/dL   HCT 31.0 (L) 36.0 - 46.0 %    Comment: Performed at [facility], [place] [address] [place], [place] [address]  Protime-INR     Status: None   Collection Time: 04/22/19 11:59 AM  Result Value Ref Range   Prothrombin Time 14.8 11.4 - 15.2 seconds   INR 1.2 0.8 - 1.2    Comment: SLIGHT HEMOLYSIS (NOTE) INR goal varies based on device and disease states. Performed at [facility], [place] [address] [place], [place] [address]   Hemoglobin and hematocrit, blood     Status: Abnormal   Collection Time: 04/22/19  3:26 PM  Result Value Ref Range   Hemoglobin 9.8 (L) 12.0 - 15.0 g/dL   HCT 30.3 (L) 36.0 - 46.0 %    Comment: Performed at [facility], [place] [address] [place], [place] [address]  Hemoglobin and hematocrit, blood     Status: Abnormal   Collection Time: 04/22/19 11:22 PM  Result Value Ref Range   Hemoglobin 10.8 (L) 12.0 - 15.0 g/dL   HCT 34.4 (L) 36.0 - 46.0 %  Comment: Performed at [facility], [place] [address], [place], [place] [address]  Renal function panel     Status: Abnormal   Collection Time: 04/23/19  3:43 AM  Result Value Ref Range   Sodium 137 135 - 145 mmol/L   Potassium 3.5 3.5 - 5.1 mmol/L   Chloride 100 98 - 111 mmol/L   CO2 28 22 - 32 mmol/L   Glucose, Bld 83 70 - 99 mg/dL    Comment: Glucose reference range applies only to samples taken after fasting for at least 8 hours.   BUN 5 (L) 8 - 23 mg/dL   Creatinine, Ser 0.70 0.44 - 1.00 mg/dL   Calcium 7.9 (L) 8.9 - 10.3 mg/dL   Phosphorus 2.6 2.5 - 4.6 mg/dL   Albumin 2.6 (L) 3.5 - 5.0 g/dL   GFR calc non Af Amer >60 >60 mL/min   GFR calc Af Amer >60 >60 mL/min    Anion gap 9 5 - 15    Comment: Performed at [facility] [address] [place], [place] [address]  Magnesium     Status: None   Collection Time: 04/23/19  3:43 AM  Result Value Ref Range   Magnesium 1.8 1.7 - 2.4 mg/dL    Comment: Performed at [facility] [address], [place], [place] [address]  CBC with Differential/Platelet     Status: Abnormal   Collection Time: 04/23/19  3:43 AM  Result Value Ref Range   WBC 5.7 4.0 - 10.5 K/uL   RBC 3.06 (L) 3.87 - 5.11 MIL/uL   Hemoglobin 8.8 (L) 12.0 - 15.0 g/dL   HCT 27.9 (L) 36.0 - 46.0 %   MCV 91.2 80.0 - 100.0 fL   MCH 28.8 26.0 - 34.0 pg   MCHC 31.5 30.0 - 36.0 g/dL   RDW 12.9 11.5 - 15.5 %   Platelets 220 150 - 400 K/uL   nRBC 0.0 0.0 - 0.2 %   Neutrophils Relative % 58 %   Neutro Abs 3.3 1.7 - 7.7 K/uL   Lymphocytes Relative 27 %   Lymphs Abs 1.6 0.7 - 4.0 K/uL   Monocytes Relative 9 %   Monocytes Absolute 0.5 0.1 - 1.0 K/uL   Eosinophils Relative 5 %   Eosinophils Absolute 0.3 0.0 - 0.5 K/uL   Basophils Relative 1 %   Basophils Absolute 0.0 0.0 - 0.1 K/uL   Immature Granulocytes 0 %   Abs Immature Granulocytes 0.02 0.00 - 0.07 K/uL    Comment: Performed at [facility] [address], [place], [place] [address]  Protime-INR     Status: None   Collection Time: 04/23/19  3:43 AM  Result Value Ref Range   Prothrombin Time 15.0 11.4 - 15.2 seconds   INR 1.2 0.8 - 1.2    Comment: (NOTE) INR goal varies based on device and disease states. Performed at [facility], [place] [address], [place], [place] [address]   Hemoglobin and hematocrit, blood     Status: Abnormal   Collection Time: 04/23/19  9:35 AM  Result Value Ref Range   Hemoglobin 9.1 (L) 12.0 - 15.0 g/dL   HCT 29.0 (L) 36.0 - 46.0 %    Comment: Performed at [facility], [place] [address] [place],  [address]  Hepatic function panel     Status: Abnormal   Collection Time: 04/23/19  9:35 AM  Result Value Ref Range   Total  Protein 5.5 (  L) 6.5 - 8.1 g/dL   Albumin 2.7 (L) 3.5 - 5.0 g/dL   AST 19 15 - 41 U/L   ALT 12 0 - 44 U/L   Alkaline Phosphatase 44 38 - 126 U/L   Total Bilirubin 2.0 (H) 0.3 - 1.2 mg/dL   Bilirubin, Direct 0.3 (H) 0.0 - 0.2 mg/dL   Indirect Bilirubin 1.7 (H) 0.3 - 0.9 mg/dL    Comment: Performed at [facility] [address], [place], [place] [address]  Hemoglobin and hematocrit, blood     Status: Abnormal   Collection Time: 04/23/19  3:28 PM  Result Value Ref Range   Hemoglobin 9.1 (L) 12.0 - 15.0 g/dL   HCT 28.9 (L) 36.0 - 46.0 %    Comment: Performed at [facility], [place] [address] [place], [place] [address]  Hemoglobin and hematocrit, blood     Status: Abnormal   Collection Time: 04/23/19 10:26 PM  Result Value Ref Range   Hemoglobin 9.1 (L) 12.0 - 15.0 g/dL   HCT 28.8 (L) 36.0 - 46.0 %    Comment: Performed at [facility], [place] [address], [place], [place] [address]  Hemoglobin and hematocrit, blood     Status: Abnormal   Collection Time: 04/24/19  3:54 AM  Result Value Ref Range   Hemoglobin 8.8 (L) 12.0 - 15.0 g/dL   HCT 28.1 (L) 36.0 - 46.0 %    Comment: Performed at [facility], [place] [address] [place], [place] [address]  CBC with Differential/Platelet     Status: Abnormal   Collection Time: 04/24/19  9:42 AM  Result Value Ref Range   WBC 5.7 4.0 - 10.5 K/uL   RBC 3.19 (L) 3.87 - 5.11 MIL/uL   Hemoglobin 9.2 (L) 12.0 - 15.0 g/dL   HCT 28.8 (L) 36.0 - 46.0 %   MCV 90.3 80.0 - 100.0 fL   MCH 28.8 26.0 - 34.0 pg   MCHC 31.9 30.0 - 36.0 g/dL   RDW 12.7 11.5 - 15.5 %   Platelets 251 150 - 400 K/uL   nRBC 0.3 (H) 0.0 - 0.2 %   Neutrophils Relative % 63 %   Neutro Abs 3.6 1.7 - 7.7 K/uL   Lymphocytes Relative 22 %   Lymphs Abs 1.3 0.7 - 4.0 K/uL   Monocytes Relative 9 %   Monocytes Absolute 0.5 0.1 - 1.0 K/uL   Eosinophils Relative 5 %   Eosinophils Absolute 0.3 0.0 - 0.5 K/uL   Basophils Relative 1 %   Basophils Absolute 0.0 0.0 - 0.1  K/uL   Immature Granulocytes 0 %   Abs Immature Granulocytes 0.02 0.00 - 0.07 K/uL    Comment: Performed at [facility] [address] [place], [place] [address]  Comprehensive metabolic panel     Status: Abnormal   Collection Time: 04/24/19  9:42 AM  Result Value Ref Range   Sodium 139 135 - 145 mmol/L   Potassium 4.3 3.5 - 5.1 mmol/L    Comment: SLIGHT HEMOLYSIS   Chloride 102 98 - 111 mmol/L   CO2 30 22 - 32 mmol/L   Glucose, Bld 92 70 - 99 mg/dL    Comment: Glucose reference range applies only to samples taken after fasting for at least 8 hours.   BUN 6 (L) 8 - 23 mg/dL   Creatinine, Ser 0.76 0.44 - 1.00 mg/dL   Calcium 8.3 (L) 8.9 - 10.3 mg/dL   Total Protein 5.7 (  L) 6.5 - 8.1 g/dL   Albumin 2.9 (L) 3.5 - 5.0 g/dL   AST 23 15 - 41 U/L   ALT 13 0 - 44 U/L   Alkaline Phosphatase 48 38 - 126 U/L   Total Bilirubin 2.6 (H) 0.3 - 1.2 mg/dL   GFR calc non Af Amer >60 >60 mL/min   GFR calc Af Amer >60 >60 mL/min   Anion gap 7 5 - 15    Comment: Performed at [facility] [address] [place], [place] [address]  Magnesium     Status: None   Collection Time: 04/24/19  9:42 AM  Result Value Ref Range   Magnesium 1.9 1.7 - 2.4 mg/dL    Comment: Performed at [facility] [address] [place], [place] [address]  Phosphorus     Status: None   Collection Time: 04/24/19  9:42 AM  Result Value Ref Range   Phosphorus 2.5 2.5 - 4.6 mg/dL    Comment: Performed at [facility] [address], [place], [place] [address]  Hemoglobin and hematocrit, blood     Status: Abnormal   Collection Time: 04/24/19  3:29 PM  Result Value Ref Range   Hemoglobin 9.4 (L) 12.0 - 15.0 g/dL   HCT 29.1 (L) 36.0 - 46.0 %    Comment: Performed at [facility], [place] [address] [place], [place] [address]  CBC with Differential/Platelet     Status: Abnormal   Collection Time: 04/25/19 11:25 AM  Result Value Ref Range   WBC 5.7 4.0 - 10.5 K/uL   RBC 3.23 (L) 3.87 - 5.11  MIL/uL   Hemoglobin 9.0 (L) 12.0 - 15.0 g/dL   HCT 29.1 (L) 36.0 - 46.0 %   MCV 90.1 80.0 - 100.0 fL   MCH 27.9 26.0 - 34.0 pg   MCHC 30.9 30.0 - 36.0 g/dL   RDW 12.8 11.5 - 15.5 %   Platelets 288 150 - 400 K/uL   nRBC 0.4 (H) 0.0 - 0.2 %   Neutrophils Relative % 59 %   Neutro Abs 3.4 1.7 - 7.7 K/uL   Lymphocytes Relative 24 %   Lymphs Abs 1.4 0.7 - 4.0 K/uL   Monocytes Relative 11 %   Monocytes Absolute 0.6 0.1 - 1.0 K/uL   Eosinophils Relative 5 %   Eosinophils Absolute 0.3 0.0 - 0.5 K/uL   Basophils Relative 1 %   Basophils Absolute 0.0 0.0 - 0.1 K/uL   Immature Granulocytes 0 %   Abs Immature Granulocytes 0.02 0.00 - 0.07 K/uL    Comment: Performed at [facility] [address], [place], [place] [address]  Comprehensive metabolic panel     Status: Abnormal   Collection Time: 04/25/19 11:25 AM  Result Value Ref Range   Sodium 138 135 - 145 mmol/L   Potassium 3.6 3.5 - 5.1 mmol/L   Chloride 99 98 - 111 mmol/L   CO2 28 22 - 32 mmol/L   Glucose, Bld 89 70 - 99 mg/dL    Comment: Glucose reference range applies only to samples taken after fasting for at least 8 hours.   BUN <5 (L) 8 - 23 mg/dL   Creatinine, Ser 0.69 0.44 - 1.00 mg/dL   Calcium 8.4 (L) 8.9 - 10.3 mg/dL   Total Protein 5.6 (L) 6.5 - 8.1 g/dL   Albumin 3.0 (L) 3.5 - 5.0 g/dL   AST 17 15 - 41 U/L   ALT 12  0 - 44 U/L   Alkaline Phosphatase 47 38 - 126 U/L   Total Bilirubin 2.2 (H) 0.3 - 1.2 mg/dL   GFR calc non Af Amer >60 >60 mL/min   GFR calc Af Amer >60 >60 mL/min   Anion gap 11 5 - 15    Comment: Performed at [facility] [address], [place], [place] [address]  Magnesium     Status: None   Collection Time: 04/25/19 11:25 AM  Result Value Ref Range   Magnesium 1.8 1.7 - 2.4 mg/dL    Comment: Performed at [facility] [address] [place], [place] [address]  Phosphorus     Status: None   Collection Time: 04/25/19 11:25 AM  Result Value Ref Range   Phosphorus 2.6 2.5 - 4.6  mg/dL    Comment: Performed at [facility] [address], [place], [place] [address]  CBC with Differential/Platelet     Status: Abnormal   Collection Time: 04/26/19  3:26 AM  Result Value Ref Range   WBC 7.1 4.0 - 10.5 K/uL   RBC 3.59 (L) 3.87 - 5.11 MIL/uL   Hemoglobin 10.2 (L) 12.0 - 15.0 g/dL   HCT 32.2 (L) 36.0 - 46.0 %   MCV 89.7 80.0 - 100.0 fL   MCH 28.4 26.0 - 34.0 pg   MCHC 31.7 30.0 - 36.0 g/dL   RDW 12.9 11.5 - 15.5 %   Platelets 342 150 - 400 K/uL   nRBC 0.4 (H) 0.0 - 0.2 %   Neutrophils Relative % 53 %   Neutro Abs 3.8 1.7 - 7.7 K/uL   Lymphocytes Relative 32 %   Lymphs Abs 2.3 0.7 - 4.0 K/uL   Monocytes Relative 8 %   Monocytes Absolute 0.6 0.1 - 1.0 K/uL   Eosinophils Relative 5 %   Eosinophils Absolute 0.3 0.0 - 0.5 K/uL   Basophils Relative 1 %   Basophils Absolute 0.0 0.0 - 0.1 K/uL   Immature Granulocytes 1 %   Abs Immature Granulocytes 0.06 0.00 - 0.07 K/uL    Comment: Performed at [facility], [address] [place], [place] [address]  Comprehensive metabolic panel     Status: Abnormal   Collection Time: 04/26/19  3:26 AM  Result Value Ref Range   Sodium 139 135 - 145 mmol/L   Potassium 3.7 3.5 - 5.1 mmol/L   Chloride 98 98 - 111 mmol/L   CO2 30 22 - 32 mmol/L   Glucose, Bld 107 (H) 70 - 99 mg/dL    Comment: Glucose reference range applies only to samples taken after fasting for at least 8 hours.   BUN <5 (L) 8 - 23 mg/dL   Creatinine, Ser 0.74 0.44 - 1.00 mg/dL   Calcium 8.7 (L) 8.9 - 10.3 mg/dL   Total Protein 6.4 (L) 6.5 - 8.1 g/dL   Albumin 3.1 (L) 3.5 - 5.0 g/dL   AST 18 15 - 41 U/L   ALT 13 0 - 44 U/L   Alkaline Phosphatase 54 38 - 126 U/L   Total Bilirubin 1.7 (H) 0.3 - 1.2 mg/dL   GFR calc non Af Amer >60 >60 mL/min   GFR calc Af Amer >60 >60 mL/min   Anion gap 11 5 - 15    Comment: Performed at [facility] [address], [place], [place] [address]  Phosphorus     Status: None   Collection Time: 04/26/19  3:26  AM  Result Value Ref Range   Phosphorus 2.8 2.5 - 4.6 mg/dL    Comment: Performed at [facility] [address] [place], [place] [address]  Magnesium     Status: None   Collection Time: 04/26/19  3:26 AM  Result Value Ref Range   Magnesium 1.9 1.7 - 2.4 mg/dL    Comment: Performed at [facility] [address] [place], [place] [address]  CBC with Differential/Platelet     Status: Abnormal   Collection Time: 04/27/19  3:52 AM  Result Value Ref Range   WBC 7.0 4.0 - 10.5 K/uL   RBC 3.63 (L) 3.87 - 5.11 MIL/uL   Hemoglobin 10.2 (L) 12.0 - 15.0 g/dL   HCT 33.2 (L) 36.0 - 46.0 %   MCV 91.5 80.0 - 100.0 fL   MCH 28.1 26.0 - 34.0 pg   MCHC 30.7 30.0 - 36.0 g/dL   RDW 13.2 11.5 - 15.5 %   Platelets 315 150 - 400 K/uL   nRBC 1.0 (H) 0.0 - 0.2 %   Neutrophils Relative % 52 %   Neutro Abs 3.6 1.7 - 7.7 K/uL   Lymphocytes Relative 30 %   Lymphs Abs 2.1 0.7 - 4.0 K/uL   Monocytes Relative 11 %   Monocytes Absolute 0.8 0.1 - 1.0 K/uL   Eosinophils Relative 5 %   Eosinophils Absolute 0.4 0.0 - 0.5 K/uL   Basophils Relative 1 %   Basophils Absolute 0.1 0.0 - 0.1 K/uL   Immature Granulocytes 1 %   Abs Immature Granulocytes 0.06 0.00 - 0.07 K/uL    Comment: Performed at [facility] [address], [place], [place] [address]  Comprehensive metabolic panel     Status: Abnormal   Collection Time: 04/27/19  3:52 AM  Result Value Ref Range   Sodium 139 135 - 145 mmol/L   Potassium 3.8 3.5 - 5.1 mmol/L   Chloride 96 (L) 98 - 111 mmol/L   CO2 32 22 - 32 mmol/L   Glucose, Bld 98 70 - 99 mg/dL    Comment: Glucose reference range applies only to samples taken after fasting for at least 8 hours.   BUN <5 (L) 8 - 23 mg/dL   Creatinine, Ser 0.92 0.44 - 1.00 mg/dL   Calcium 8.7 (L) 8.9 - 10.3 mg/dL   Total Protein 6.2 (L) 6.5 - 8.1 g/dL   Albumin 3.2 (L) 3.5 - 5.0 g/dL   AST 19 15 - 41 U/L   ALT 10 0 - 44 U/L   Alkaline Phosphatase 53 38 - 126 U/L   Total Bilirubin 1.6  (H) 0.3 - 1.2 mg/dL   GFR calc non Af Amer >60 >60 mL/min   GFR calc Af Amer >60 >60 mL/min   Anion gap 11 5 - 15    Comment: Performed at [facility], [place] [address], [place], [place] [address]  Magnesium     Status: None   Collection Time: 04/27/19  3:52 AM  Result Value Ref Range   Magnesium 2.0 1.7 - 2.4 mg/dL    Comment: Performed at [facility] [address] [place], [place] [address]  Phosphorus     Status: None   Collection Time: 04/27/19  3:52 AM  Result Value Ref Range   Phosphorus 3.5 2.5 - 4.6 mg/dL    Comment: Performed at [facility] [address] [place], [place] [address]  Prothrombin gene mutation  Status: None   Collection Time: 05/13/19  3:18 PM  Result Value Ref Range   Recommendations-PTGENE: Comment     Comment: (NOTE) NEGATIVE No mutation identified. Comment: A point mutation ([id number]) in the factor II (prothrombin) gene is the second most common cause of inherited thrombophilia. The incidence of this mutation in the [place] Caucasian population is about 2% and in the [place] American population it is approximately 0.5%. This mutation is rare in the [place] and Native American population. Being heterozygous for a prothrombin mutation increases the risk for developing venous thrombosis about 2 to 3 times above the general population risk. Being homozygous for the prothrombin gene mutation increases the relative risk for venous thrombosis further, although it is not yet known how much further the risk is increased. In women heterozygous for the prothrombin gene mutation, the use of estrogen containing oral contraceptives increases the relative risk of venous thrombosis about 16 times and the risk of developing cerebral thrombosis is also significantly increased. In pregnancy the pr othrombin gene mutation increases risk for venous thrombosis and may increase risk for stillbirth, placental abruption, pre-eclampsia and fetal growth  restriction. If the patient possesses two or more congenital or acquired thrombophilic risk factors, the risk for thrombosis may rise to more than the sum of the risk ratios for the individual mutations. This assay detects only the prothrombin G20210A mutation and does not measure genetic abnormalities elsewhere in the genome. Other thrombotic risk factors may be pursued through systematic clinical laboratory analysis. These factors include the R506Q ([place]) mutation in the Factor V gene, plasma homocysteine levels, as well as testing for deficiencies of antithrombin III, protein C and protein S. Genetic Counselors are available for health care providers to discuss results at [phone]). Methodology: DNA analysis of the Factor II gene was performed by PCR amplification followed by restriction analysis. The di agnostic sensitivity is >99% for both. All the tests must be combined with clinical information for the most accurate interpretation. Molecular-based testing is highly accurate, but as in any laboratory test, diagnostic errors may occur. This test was developed and its performance characteristics determined by [facility]. It has not been cleared or approved by the Food and Drug Administration. Poort SR, et al. Blood. [id number]. Varga EA. Circulation. 2004; [id number]. Mervin Hack, et [place]; 19:700-703. 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 Factor V Leiden is more resistant to inactivation by activated protein C.  As a result, factor V persists in the circulation leading to a mild hypercoagulable state.  Factor V Leiden has been reported in patients with deep vein thrombosis, pulmonary embolus, central retinal vein occulsion, cerebral sinus thrombosis, and hepatic vein thrombosis. The relative risk of venous thrombosis is increased approximately 4-8 fold in individuals who are heterozygous.  About 3- 8% of the general [place] and [place] population are heterozygous.  The risk of venous thrombosis increases exponentially  in patients with more than one risk factor, including:  age, surgery, oral contraceptive use, pregnancy, elevated homocysteine levels, or a Factor II/prothrombin mutation (G20210A).  Additionally, for i ndividuals found to be heterozygous for the Factor V Leiden mutation, presence of a second mutation, Factor V R2, further increases the risk if venous thrombosis. Contact [facility]'s Genetics Customer Service at [phone] for further information on both the Factor II (Prothrombin) DNA Analysis, and Factor V R2 DNA Analysis tests. **Genetic counselors are available for health care providers to**  discuss results at [phone] [phone]). Methodology: DNA analysis of the Factor V gene was performed by allele-specific PCR. The diagnostic sensitivity and specificity is >99% for both. Molecular-based testing is highly accurate, but as in any laboratory test, diagnostic errors may occur. All test results must be combined with clinical information for the most accurate interpretation. This test was developed and its performance characteristics determined by [facility]. It has not been cleared or approved by the Food and Drug Administration. References: [place]elker[place] K (1996).   Clin Lab Med [phone]. Allison Quarry, PhD, [facility] Ruben Reason, PhD, [facility] Ileene Hutchinson, PhD, [facility] Alfredo Bach, PhD, [facility] Norva Riffle, PhD, [facility] Earlean Polka PhD, [facility] Performed At: [facility] [address] [place], [place] [id number] Katina Degree MDPhD [id number]   Comprehensive metabolic panel     Status: Abnormal   Collection Time: 05/13/19  3:18 PM  Result Value Ref Range   Sodium 137 135 - 145 mmol/L   Potassium 3.8 3.5 - 5.1 mmol/L   Chloride 101 98 - 111 mmol/L   CO2 25 22 - 32 mmol/L   Glucose, Bld 109 (H) 70 - 99 mg/dL    Comment: Glucose reference range applies only to samples taken after fasting for at least 8 hours.   BUN 10 8 - 23 mg/dL   Creatinine, Ser 0.90 0.44 - 1.00 mg/dL   Calcium 8.8 (L) 8.9 - 10.3 mg/dL   Total Protein 7.5 6.5 - 8.1 g/dL   Albumin 4.0 3.5 - 5.0 g/dL   AST 31 15 - 41 U/L   ALT 14 0 - 44 U/L   Alkaline Phosphatase 64 38 - 126 U/L   Total Bilirubin 1.4 (H) 0.3 - 1.2 mg/dL   GFR calc non Af Amer >60 >60 mL/min   GFR calc Af Amer >60 >60 mL/min   Anion gap 11 5 - 15    Comment: Performed at [facility], [address] [place],  [address]  CBC with Differential/Platelet     Status: None   Collection Time: 05/13/19  3:18 PM  Result Value Ref Range   WBC 8.5 4.0 - 10.5 K/uL   RBC 4.40 3.87 - 5.11 MIL/uL   Hemoglobin 12.5 12.0 - 15.0 g/dL   HCT 39.6 36.0 - 46.0 %   MCV 90.0 80.0 - 100.0 fL   MCH 28.4 26.0 - 34.0 pg   MCHC 31.6 30.0 - 36.0 g/dL   RDW 13.9 11.5 - 15.5 %   Platelets 256 150 - 400 K/uL   nRBC 0.0 0.0 - 0.2 %   Neutrophils Relative % 60 %   Neutro Abs 5.2 1.7 - 7.7 K/uL   Lymphocytes Relative 26 %   Lymphs Abs 2.2 0.7 - 4.0 K/uL   Monocytes Relative 8 %   Monocytes Absolute 0.7 0.1 - 1.0 K/uL   Eosinophils Relative 4 %   Eosinophils Absolute 0.4 0.0 - 0.5 K/uL   Basophils Relative 1 %  Basophils Absolute 0.1 0.0 - 0.1 K/uL   Immature Granulocytes 1 %   Abs Immature Granulocytes 0.05 0.00 - 0.07 K/uL     Comment: Performed at [facility], [address] [place], [place] [address]    Assessment/Plan:  Retroperitoneal hematoma This was a relatively large retroperitoneal hematoma and actually created IVC compression based off the CT scan which I have reviewed.  Now that that is better, her leg swelling has markedly improved and I think this had a large part to do with her significant leg swelling.    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  At some point going forward, I think it is reasonable to restart her on anticoagulation but would prefer one of the newer agents with less likelihood of being supratherapeutic and a lower risk of bleeding.  We could even do that in a lower dose if felt safe.  She is scheduled to see her hematologist next month and they are going to discuss these options.  Essential hypertension, benign blood pressure control important in reducing the progression of atherosclerotic disease. On appropriate oral medications.   Swelling of limb Her swelling is a fair bit better now.  She has some chronic swelling although it is reasonably well controlled.  She had a recent retroperitoneal hematoma which was quite large. This was a relatively large retroperitoneal hematoma and actually created IVC compression based off the CT scan which I have reviewed.  Now that that is better, her leg swelling has markedly improved and I think this had a large part to do with her significant leg swelling.   I would recommend she continue wearing compression stockings and elevating her legs.  Increasing activity is also benefit.  I would offer her a venous reflux study to evaluate her venous system and determine if future options are helpful if her swelling recurs.  For now, she says she would like to call us if her swelling gets worse and that is certainly reasonable.      Leotis Pain 05/20/2019, 4:55 PM   This note was  created with Dragon medical transcription system.  Any errors from dictation are unintentional.

## 2019-05-20 NOTE — Assessment & Plan Note (Signed)
Off anticoagulation for her recent retroperitoneal hematoma for supratherapeutic INR.  At some point going forward, I think it is reasonable to restart her on anticoagulation but would prefer one of the newer agents with less likelihood of being supratherapeutic and a lower risk of bleeding.  We could even do that in a lower dose if felt safe.  She is scheduled to see her hematologist next month and they are going to discuss these options.

## 2019-05-21 NOTE — Telephone Encounter (Signed)
Tina Duarte from [facility] called regarding verbal orders for patients.

## 2019-05-21 NOTE — Telephone Encounter (Signed)
Verbals left on secure voicemail °

## 2019-05-22 DIAGNOSIS — K661 Hemoperitoneum: Secondary | ICD-10-CM | POA: Diagnosis not present

## 2019-05-22 DIAGNOSIS — J9621 Acute and chronic respiratory failure with hypoxia: Secondary | ICD-10-CM

## 2019-05-22 DIAGNOSIS — Z9181 History of falling: Secondary | ICD-10-CM

## 2019-05-22 DIAGNOSIS — E785 Hyperlipidemia, unspecified: Secondary | ICD-10-CM

## 2019-05-22 DIAGNOSIS — F329 Major depressive disorder, single episode, unspecified: Secondary | ICD-10-CM

## 2019-05-22 DIAGNOSIS — Z6835 Body mass index (BMI) 35.0-35.9, adult: Secondary | ICD-10-CM

## 2019-05-22 DIAGNOSIS — Z9981 Dependence on supplemental oxygen: Secondary | ICD-10-CM

## 2019-05-22 DIAGNOSIS — E669 Obesity, unspecified: Secondary | ICD-10-CM

## 2019-05-22 DIAGNOSIS — D6851 Activated protein C resistance: Secondary | ICD-10-CM | POA: Diagnosis not present

## 2019-05-22 DIAGNOSIS — J449 Chronic obstructive pulmonary disease, unspecified: Secondary | ICD-10-CM

## 2019-05-22 DIAGNOSIS — R112 Nausea with vomiting, unspecified: Secondary | ICD-10-CM

## 2019-05-22 DIAGNOSIS — E7800 Pure hypercholesterolemia, unspecified: Secondary | ICD-10-CM

## 2019-05-22 DIAGNOSIS — T45515D Adverse effect of anticoagulants, subsequent encounter: Secondary | ICD-10-CM | POA: Diagnosis not present

## 2019-05-22 DIAGNOSIS — D6832 Hemorrhagic disorder due to extrinsic circulating anticoagulants: Secondary | ICD-10-CM | POA: Diagnosis not present

## 2019-05-22 DIAGNOSIS — E876 Hypokalemia: Secondary | ICD-10-CM

## 2019-05-22 DIAGNOSIS — Z86718 Personal history of other venous thrombosis and embolism: Secondary | ICD-10-CM

## 2019-05-22 DIAGNOSIS — I10 Essential (primary) hypertension: Secondary | ICD-10-CM

## 2019-05-26 DIAGNOSIS — E785 Hyperlipidemia, unspecified: Secondary | ICD-10-CM | POA: Diagnosis not present

## 2019-05-26 DIAGNOSIS — J449 Chronic obstructive pulmonary disease, unspecified: Secondary | ICD-10-CM | POA: Diagnosis not present

## 2019-05-26 DIAGNOSIS — J9621 Acute and chronic respiratory failure with hypoxia: Secondary | ICD-10-CM | POA: Diagnosis not present

## 2019-05-26 DIAGNOSIS — E7800 Pure hypercholesterolemia, unspecified: Secondary | ICD-10-CM | POA: Diagnosis not present

## 2019-05-26 DIAGNOSIS — K661 Hemoperitoneum: Secondary | ICD-10-CM | POA: Diagnosis not present

## 2019-05-26 DIAGNOSIS — D6832 Hemorrhagic disorder due to extrinsic circulating anticoagulants: Secondary | ICD-10-CM | POA: Diagnosis not present

## 2019-05-26 DIAGNOSIS — I10 Essential (primary) hypertension: Secondary | ICD-10-CM | POA: Diagnosis not present

## 2019-05-26 DIAGNOSIS — D6851 Activated protein C resistance: Secondary | ICD-10-CM | POA: Diagnosis not present

## 2019-05-26 DIAGNOSIS — T45515D Adverse effect of anticoagulants, subsequent encounter: Secondary | ICD-10-CM | POA: Diagnosis not present

## 2019-05-26 DIAGNOSIS — E876 Hypokalemia: Secondary | ICD-10-CM | POA: Diagnosis not present

## 2019-05-30 ENCOUNTER — Ambulatory Visit: Payer: Medicare Other

## 2019-05-30 ENCOUNTER — Telehealth: Payer: Self-pay | Admitting: Internal Medicine

## 2019-05-30 NOTE — Telephone Encounter (Signed)
Patient phoned and rescheduled follow up with MD to be after CT scan.

## 2019-06-02 DIAGNOSIS — E876 Hypokalemia: Secondary | ICD-10-CM | POA: Diagnosis not present

## 2019-06-02 DIAGNOSIS — E785 Hyperlipidemia, unspecified: Secondary | ICD-10-CM | POA: Diagnosis not present

## 2019-06-02 DIAGNOSIS — I10 Essential (primary) hypertension: Secondary | ICD-10-CM | POA: Diagnosis not present

## 2019-06-02 DIAGNOSIS — K661 Hemoperitoneum: Secondary | ICD-10-CM | POA: Diagnosis not present

## 2019-06-02 DIAGNOSIS — J9621 Acute and chronic respiratory failure with hypoxia: Secondary | ICD-10-CM | POA: Diagnosis not present

## 2019-06-02 DIAGNOSIS — D6851 Activated protein C resistance: Secondary | ICD-10-CM | POA: Diagnosis not present

## 2019-06-02 DIAGNOSIS — J449 Chronic obstructive pulmonary disease, unspecified: Secondary | ICD-10-CM | POA: Diagnosis not present

## 2019-06-02 DIAGNOSIS — T45515D Adverse effect of anticoagulants, subsequent encounter: Secondary | ICD-10-CM | POA: Diagnosis not present

## 2019-06-02 DIAGNOSIS — E7800 Pure hypercholesterolemia, unspecified: Secondary | ICD-10-CM | POA: Diagnosis not present

## 2019-06-02 DIAGNOSIS — D6832 Hemorrhagic disorder due to extrinsic circulating anticoagulants: Secondary | ICD-10-CM | POA: Diagnosis not present

## 2019-06-03 ENCOUNTER — Inpatient Hospital Stay: Payer: Medicare Other | Admitting: Internal Medicine

## 2019-06-04 ENCOUNTER — Other Ambulatory Visit: Payer: Self-pay

## 2019-06-04 ENCOUNTER — Ambulatory Visit
Admission: RE | Admit: 2019-06-04 | Discharge: 2019-06-04 | Disposition: A | Discharge status: Home / Self Care | Payer: Medicare Other | Source: Ambulatory Visit | Attending: Internal Medicine | Admitting: Internal Medicine

## 2019-06-04 DIAGNOSIS — K661 Hemoperitoneum: Secondary | ICD-10-CM | POA: Diagnosis not present

## 2019-06-04 DIAGNOSIS — D6859 Other primary thrombophilia: Secondary | ICD-10-CM

## 2019-06-04 DIAGNOSIS — K429 Umbilical hernia without obstruction or gangrene: Secondary | ICD-10-CM | POA: Diagnosis not present

## 2019-06-09 ENCOUNTER — Inpatient Hospital Stay: Payer: Medicare Other | Attending: Internal Medicine | Admitting: Internal Medicine

## 2019-06-09 ENCOUNTER — Other Ambulatory Visit: Payer: Self-pay

## 2019-06-09 DIAGNOSIS — Z86718 Personal history of other venous thrombosis and embolism: Secondary | ICD-10-CM | POA: Insufficient documentation

## 2019-06-09 DIAGNOSIS — D6859 Other primary thrombophilia: Secondary | ICD-10-CM | POA: Diagnosis not present

## 2019-06-09 DIAGNOSIS — E669 Obesity, unspecified: Secondary | ICD-10-CM | POA: Diagnosis not present

## 2019-06-09 DIAGNOSIS — Z7901 Long term (current) use of anticoagulants: Secondary | ICD-10-CM | POA: Insufficient documentation

## 2019-06-09 DIAGNOSIS — Z993 Dependence on wheelchair: Secondary | ICD-10-CM | POA: Insufficient documentation

## 2019-06-09 MED ORDER — APIXABAN 2.5 MG PO TABS: 2.5000 mg | ORAL_TABLET | Freq: Two times a day (BID) | ORAL | 3 refills | Status: DC

## 2019-06-09 NOTE — Assessment & Plan Note (Addendum)
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  Patient currently off Coumadin [see below] in the recent retroperitoneal hematoma.   #Given history of multiple blood clots/DVTs-I think patient will need to go back on anticoagulation.  I would recommend Eliquis 2.5 mg as a starting dose.  Discussed with pharmacy regarding financial assistance.  #Retroperitoneal hematoma 9 cm in size [risk factors-supratherapeutic anticoagulation-INR 4.5; fall].  Currently off Coumadin.;  Repeat CT imaging shows improvement of the retroperitoneal hematoma.  Proceed with anticoagulation as discussed above.   # DISPOSITION: # follow up in 1 month- MD; cbc/bmp-Dr.B.   # I reviewed the blood work- with the patient in detail; also reviewed the imaging independently [as summarized above]; and with the patient in detail.

## 2019-06-09 NOTE — Progress Notes (Signed)
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  May 2021-improvement of the hematoma.  # COPD 1 lit/ [place]   Oncology History   No history exists.     HISTORY OF PRESENTING ILLNESS:  Tina Duarte 74 y.o.  female history of factor V heterozygous; and history of recurrent thromboembolic events is here for follow-up.  Patient was taken off Coumadin after a fall-in March 2021 were noted to have a large retroperitoneal hematoma.   Patient currently feels improved.  Denies any nausea vomiting.  Denies any blood in stools or black or stools.  No falls.  Review of Systems  Constitutional: Negative for chills, diaphoresis, fever, malaise/fatigue and weight loss.  HENT: Negative for nosebleeds and sore throat.   Eyes: Negative for double vision.  Respiratory: Positive for shortness of breath. Negative for cough, hemoptysis, sputum production and wheezing.   Cardiovascular: Negative for chest pain, palpitations, orthopnea and leg swelling.  Gastrointestinal: Negative for abdominal pain, blood in stool, constipation, diarrhea, heartburn, melena, nausea and vomiting.  Genitourinary: Negative for dysuria, frequency and urgency.  Musculoskeletal: Positive for back pain and joint pain.  Skin: Negative.  Negative for itching and rash.  Neurological: Negative for dizziness, tingling, focal weakness, weakness and headaches.  Endo/Heme/Allergies: Does not bruise/bleed easily.  Psychiatric/Behavioral: Negative for depression. The patient is not nervous/anxious and does not have insomnia.      MEDICAL HISTORY:   Past Medical History:  Diagnosis Date  . Allergy   . Asthma   . Chicken pox   . History of blood clots   . Hypercholesterolemia   . Hypertension   . Retroperitoneal hematoma 03/2019    SURGICAL HISTORY: Past Surgical History:  Procedure Laterality Date  . blood clots  2008  . TUBAL LIGATION      SOCIAL HISTORY: Social History   Socioeconomic History  . Marital status: Widowed    Spouse name: Not on file  . Number of children: Not on file  . Years of education: Not on file  . Highest education level: Not on file  Occupational History  . Not on file  Tobacco Use  . Smoking status: Never Smoker  . Smokeless tobacco: Never Used  Substance and Sexual Activity  . Alcohol use: No    Alcohol/week: 0.0 standard drinks  . Drug use: No  . Sexual activity: Not on file  Other Topics Concern  . Not on file  Social History Narrative   Lives in [place] with son.  never smoked; no alcohol. Used to work in [employer]    Social Determinants of Health   Financial Resource Strain: Low Risk   . Difficulty of Paying Living Expenses: Not hard at all  Food Insecurity: No Food Insecurity  . Worried About Charity fundraiser in the Last Year: Never true  . Ran Out of Food in the Last Year: Never true  Transportation Needs: No Transportation Needs  . Lack of Transportation (Medical): No  . Lack of Transportation (Non-Medical): No  Physical Activity: Unknown  . Days of Exercise per Week: 0 days  . Minutes of Exercise per Session: Not on file  Stress: No Stress Concern  Present  . Feeling of Stress : Not at all  Social Connections:   . Frequency of Communication with Friends and Family:   . Frequency of Social Gatherings with Friends and Family:   . Attends Religious Services:   . Active Member of Clubs or Organizations:   . Attends Archivist Meetings:   Marland Kitchen Marital Status:   Intimate Partner Violence:   . Fear of Current or Ex-Partner:   . Emotionally Abused:   Marland Kitchen  Physically Abused:   . Sexually Abused:     FAMILY HISTORY: Family History  Problem Relation Age of Onset  . Cancer Mother        Breast  . Heart disease Mother   . Stroke Mother   . Hypertension Mother   . Breast cancer Mother        71's  . Heart disease Father   . Stroke Father   . Hypertension Father   . Diabetes Father   . Colon cancer Other        paternal cousin    ALLERGIES:  has No Known Allergies.  MEDICATIONS:  Current Outpatient Medications  Medication Sig Dispense Refill  . acetaminophen (TYLENOL) 500 MG tablet Take 1,500 mg by mouth daily as needed for headache (pain).    Marland Kitchen albuterol (ACCUNEB) 0.63 MG/3ML nebulizer solution Take 3 mLs (0.63 mg total) by nebulization every 6 (six) hours as needed for wheezing. 75 mL 12  . budesonide (PULMICORT) 0.5 MG/2ML nebulizer solution Take 2 mLs by nebulization 2 (two) times daily as needed (shortness of breath).     . citalopram (CELEXA) 20 MG tablet Take 1 tablet (20 mg total) by mouth daily. (Patient taking differently: Take 20 mg by mouth daily with lunch. ) 90 tablet 1  . co-enzyme Q-10 30 MG capsule Take 30 mg by mouth daily.    Marland Kitchen guaiFENesin (MUCINEX) 600 MG 12 hr tablet Take 1 tablet (600 mg total) by mouth 2 (two) times daily. 10 tablet 0  . losartan (COZAAR) 50 MG tablet TAKE 1 TABLET BY MOUTH ONCE A DAY (Patient taking differently: Take 50 mg by mouth daily with lunch. ) 90 tablet 1  . rosuvastatin (CRESTOR) 5 MG tablet Take 1 tablet (5 mg total) by mouth daily. (Patient taking differently: Take 5 mg by mouth daily with lunch. ) 90 tablet 1  . senna-docusate (SENOKOT-S) 8.6-50 MG tablet Take 1 tablet by mouth at bedtime. 30 tablet 0  . apixaban (ELIQUIS) 2.5 MG TABS tablet Take 1 tablet (2.5 mg total) by mouth 2 (two) times daily. 60 tablet 3   No current facility-administered medications for this visit.      Marland Kitchen  PHYSICAL EXAMINATION:  Vitals:   06/09/19 1535  BP: 119/77  Pulse: 92  Temp: (!) 97.3 F  (36.3 C)   Filed Weights   06/09/19 1535  Weight: 220 lb (99.8 kg)    Physical Exam  Constitutional: She is oriented to person, place, and time and well-developed, well-nourished, and in no distress.  Obese.  In a wheelchair.  Accompanied by son.  HENT:  Head: Normocephalic and atraumatic.  Mouth/Throat: Oropharynx is clear and moist. No oropharyngeal exudate.  Eyes: Pupils are equal, round, and reactive to light.  Cardiovascular: Normal rate and regular rhythm.  Pulmonary/Chest: No respiratory distress. She has no wheezes.  Decreased air entry bilaterally.  Nasal cannula oxygen 1 L.  Abdominal: Soft. Bowel sounds are normal. She exhibits no distension and no mass. There is no abdominal  tenderness. There is no rebound and no guarding.  Musculoskeletal:        General: No tenderness or edema. Normal range of motion.     Cervical back: Normal range of motion and neck supple.  Neurological: She is alert and oriented to person, place, and time.  Skin: Skin is warm.  Psychiatric: Affect normal.     LABORATORY DATA:  I have reviewed the data as listed Lab Results  Component Value Date   WBC 8.5 05/13/2019   HGB 12.5 05/13/2019   HCT 39.6 05/13/2019   MCV 90.0 05/13/2019   PLT 256 05/13/2019   Recent Labs    11/19/18 1509 11/19/18 1509 03/18/19 1521 04/19/19 0049 04/23/19 0935 04/24/19 0942 04/26/19 0326 04/27/19 0352 05/13/19 1518  NA 140   < > 140   < >  --    < > 139 139 137  K 4.4   < > 3.6   < >  --    < > 3.7 3.8 3.8  CL 103   < > 103   < >  --    < > 98 96* 101  CO2 29   < > 28   < >  --    < > 30 32 25  GLUCOSE 88   < > 85   < >  --    < > 107* 98 109*  BUN 11   < > 13   < >  --    < > <5* <5* 10  CREATININE 1.08   < > 0.99   < >  --    < > 0.74 0.92 0.90  CALCIUM 9.0   < > 8.8   < >  --    < > 8.7* 8.7* 8.8*  GFRNONAA  --   --   --    < >  --    < > >60 >60 >60  GFRAA  --   --   --    < >  --    < > >60 >60 >60  PROT 7.0   < > 6.5   < > 5.5*   < > 6.4*  6.2* 7.5  ALBUMIN 4.4   < > 4.1   < > 2.7*   < > 3.1* 3.2* 4.0  AST 29   < > 24   < > 19   < > 18 19 31   ALT 20   < > 15   < > 12   < > 13 10 14   ALKPHOS 63   < > 58   < > 44   < > 54 53 64  BILITOT 0.8   < > 0.9   < > 2.0*   < > 1.7* 1.6* 1.4*  BILIDIR 0.1  --  0.2  --  0.3*  --   --   --   --   IBILI  --   --   --   --  1.7*  --   --   --   --    < > = values in this interval not displayed.    RADIOGRAPHIC STUDIES: I have personally reviewed the radiological images as listed and agreed with the findings in the report. CT Abdomen Pelvis Wo Contrast  Result Date: 06/04/2019 CLINICAL DATA:  Follow-up retroperitoneal hematoma in the setting of anticoagulation. Decreased appetite and weight loss. EXAM: CT ABDOMEN AND PELVIS WITHOUT CONTRAST TECHNIQUE: Multidetector CT imaging of the abdomen and  pelvis was performed following the standard protocol without IV contrast. COMPARISON:  04/26/2019 CT abdomen/pelvis. FINDINGS: Lower chest: Peripheral left lower lobe 3 mm solid pulmonary nodule is stable (series 3/image 3) since 02/23/2013 CT, considered benign. No acute abnormality at the lung bases. Hepatobiliary: Normal liver size. No liver mass. Normal gallbladder with no radiopaque cholelithiasis. No biliary ductal dilatation. Pancreas: Normal, with no mass or duct dilation. Spleen: Normal size. No mass. Adrenals/Urinary Tract: Normal adrenals. No renal stones. No hydronephrosis. No contour deforming renal masses. Normal bladder. Stomach/Bowel: Normal non-distended stomach. Normal caliber small bowel with no small bowel wall thickening. Oral contrast transits to the colon. Normal appendix. Mild sigmoid diverticulosis, with no large bowel wall thickening or significant pericolonic fat stranding. Vascular/Lymphatic: Atherosclerotic nonaneurysmal abdominal aorta. No pathologically enlarged lymph nodes in the abdomen or pelvis. Reproductive: Grossly normal uterus.  No adnexal mass. Other: No pneumoperitoneum. No  ascites. Soft tissue density 6.7 x 4.1 cm retroperitoneal collection along the transverse duodenum (series 2/image 30), decreased from 12.5 x 5.7 cm on 04/26/2019 CT. Small fat containing umbilical hernia is stable. Musculoskeletal: No aggressive appearing focal osseous lesions. Marked lumbar spondylosis. IMPRESSION: 1. Retroperitoneal hematoma along the transverse duodenum measures 6.7 x 4.1 cm, significantly decreased since 04/26/2019 CT. 2. Mild sigmoid diverticulosis. 3. Stable small fat containing umbilical hernia. 4. Aortic Atherosclerosis (ICD10-I70.0). Electronically Signed   By: Ilona Sorrel M.D.   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  Patient currently off Coumadin [see below] in the recent retroperitoneal hematoma.   #Given history of multiple blood clots/DVTs-I think patient will need to go back on anticoagulation.  I would recommend Eliquis 2.5 mg as a starting dose.  Discussed with pharmacy regarding financial assistance.  #Retroperitoneal hematoma 9 cm in size [risk factors-supratherapeutic anticoagulation-INR 4.5; fall].  Currently off Coumadin.;  Repeat CT imaging shows improvement of the retroperitoneal hematoma.  Proceed with anticoagulation as discussed above.   # DISPOSITION: # follow up in 1 month- MD; cbc/bmp-Dr.B.   # I reviewed the blood work- with the patient in detail; also reviewed the imaging independently [as summarized above]; and with the patient in detail.    All questions were answered. The patient knows to call the clinic with any problems, questions or concerns.    Tina Sickle, MD 06/15/2019 7:50 PM

## 2019-06-10 DIAGNOSIS — J449 Chronic obstructive pulmonary disease, unspecified: Secondary | ICD-10-CM | POA: Diagnosis not present

## 2019-06-15 ENCOUNTER — Other Ambulatory Visit: Payer: Self-pay | Admitting: Internal Medicine

## 2019-06-19 ENCOUNTER — Ambulatory Visit: Payer: Medicare Other | Admitting: Pulmonary Disease

## 2019-07-07 ENCOUNTER — Other Ambulatory Visit: Payer: Self-pay

## 2019-07-07 ENCOUNTER — Inpatient Hospital Stay: Payer: Medicare Other | Attending: Internal Medicine

## 2019-07-07 ENCOUNTER — Inpatient Hospital Stay (HOSPITAL_BASED_OUTPATIENT_CLINIC_OR_DEPARTMENT_OTHER): Payer: Medicare Other | Admitting: Internal Medicine

## 2019-07-07 DIAGNOSIS — Z86718 Personal history of other venous thrombosis and embolism: Secondary | ICD-10-CM | POA: Insufficient documentation

## 2019-07-07 DIAGNOSIS — K661 Hemoperitoneum: Secondary | ICD-10-CM | POA: Insufficient documentation

## 2019-07-07 DIAGNOSIS — D6851 Activated protein C resistance: Secondary | ICD-10-CM | POA: Insufficient documentation

## 2019-07-07 DIAGNOSIS — E669 Obesity, unspecified: Secondary | ICD-10-CM | POA: Insufficient documentation

## 2019-07-07 DIAGNOSIS — Z993 Dependence on wheelchair: Secondary | ICD-10-CM | POA: Insufficient documentation

## 2019-07-07 DIAGNOSIS — J449 Chronic obstructive pulmonary disease, unspecified: Secondary | ICD-10-CM | POA: Insufficient documentation

## 2019-07-07 DIAGNOSIS — D6859 Other primary thrombophilia: Secondary | ICD-10-CM | POA: Diagnosis not present

## 2019-07-07 DIAGNOSIS — Z9181 History of falling: Secondary | ICD-10-CM | POA: Diagnosis not present

## 2019-07-07 DIAGNOSIS — Z9981 Dependence on supplemental oxygen: Secondary | ICD-10-CM | POA: Diagnosis not present

## 2019-07-07 DIAGNOSIS — Z7901 Long term (current) use of anticoagulants: Secondary | ICD-10-CM | POA: Diagnosis not present

## 2019-07-07 LAB — BASIC METABOLIC PANEL
Anion gap: 9 (ref 5–15)
BUN: 8 mg/dL (ref 8–23)
CO2: 27 mmol/L (ref 22–32)
Calcium: 8.5 mg/dL — ABNORMAL LOW (ref 8.9–10.3)
Chloride: 105 mmol/L (ref 98–111)
Creatinine, Ser: 0.89 mg/dL (ref 0.44–1.00)
GFR calc Af Amer: >60 mL/min (ref >60)
GFR calc non Af Amer: >60 mL/min (ref >60)
Glucose, Bld: 104 mg/dL — ABNORMAL HIGH (ref 70–99)
Potassium: 3.9 mmol/L (ref 3.5–5.1)
Sodium: 141 mmol/L (ref 135–145)

## 2019-07-07 LAB — CBC WITH DIFFERENTIAL/PLATELET
Abs Immature Granulocytes: 0.01 10*3/uL (ref 0.00–0.07)
Basophils Absolute: 0.1 10*3/uL (ref 0.0–0.1)
Basophils Relative: 1 %
Eosinophils Absolute: 0.4 10*3/uL (ref 0.0–0.5)
Eosinophils Relative: 5 %
HCT: 44.5 % (ref 36.0–46.0)
Hemoglobin: 14.3 g/dL (ref 12.0–15.0)
Immature Granulocytes: 0 %
Lymphocytes Relative: 41 %
Lymphs Abs: 3 10*3/uL (ref 0.7–4.0)
MCH: 27.7 pg (ref 26.0–34.0)
MCHC: 32.1 g/dL (ref 30.0–36.0)
MCV: 86.1 fL (ref 80.0–100.0)
Monocytes Absolute: 0.4 10*3/uL (ref 0.1–1.0)
Monocytes Relative: 5 %
Neutro Abs: 3.4 10*3/uL (ref 1.7–7.7)
Neutrophils Relative %: 48 %
Platelets: 276 10*3/uL (ref 150–400)
RBC: 5.17 MIL/uL — ABNORMAL HIGH (ref 3.87–5.11)
RDW: 13.5 % (ref 11.5–15.5)
WBC: 7.2 10*3/uL (ref 4.0–10.5)
nRBC: 0 % (ref 0.0–0.2)

## 2019-07-07 NOTE — Progress Notes (Signed)
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  May 2021-improvement of the hematoma.  # COPD 1 lit/ [place]   Oncology History   No history exists.     HISTORY OF PRESENTING ILLNESS:  Tina Duarte 74 y.o.  female history of factor V heterozygous; and history of recurrent thromboembolic events is here for follow-up.  Patient was taken off Coumadin after a fall-in March 2021 were noted to have a large retroperitoneal hematoma.  However at last visit patient's hematoma was stable and a follow-up CT scan.  Patient is currently back on Eliquis 2.5 mg a day.  Patient denies any abdominal pain denies any back pain.  Denies any nausea vomiting but no falls.  Review of Systems  Constitutional: Negative for chills, diaphoresis, fever, malaise/fatigue and weight loss.  HENT: Negative for nosebleeds and sore throat.   Eyes: Negative for double vision.  Respiratory: Positive for shortness of breath. Negative for cough, hemoptysis, sputum production and wheezing.   Cardiovascular: Negative for chest pain, palpitations, orthopnea and leg swelling.  Gastrointestinal: Negative for abdominal pain, blood in stool, constipation, diarrhea, heartburn, melena, nausea and vomiting.  Genitourinary: Negative for dysuria, frequency and urgency.  Musculoskeletal: Positive for back pain and joint pain.  Skin: Negative.  Negative for itching and rash.  Neurological: Negative for dizziness, tingling, focal weakness, weakness and headaches.  Endo/Heme/Allergies: Does not bruise/bleed easily.  Psychiatric/Behavioral: Negative for  depression. The patient is not nervous/anxious and does not have insomnia.      MEDICAL HISTORY:  Past Medical History:  Diagnosis Date  . Allergy   . Asthma   . Chicken pox   . History of blood clots   . Hypercholesterolemia   . Hypertension   . Retroperitoneal hematoma 03/2019    SURGICAL HISTORY: Past Surgical History:  Procedure Laterality Date  . blood clots  2008  . TUBAL LIGATION      SOCIAL HISTORY: Social History   Socioeconomic History  . Marital status: Widowed    Spouse name: Not on file  . Number of children: Not on file  . Years of education: Not on file  . Highest education level: Not on file  Occupational History  . Not on file  Tobacco Use  . Smoking status: Never Smoker  . Smokeless tobacco: Never Used  Vaping Use  . Vaping Use: Never used  Substance and Sexual Activity  . Alcohol use: No    Alcohol/week: 0.0 standard drinks  . Drug use: No  . Sexual activity: Not on file  Other Topics Concern  . Not on file  Social History Narrative   Lives in [place] with son.  never smoked; no alcohol. Used to work in [employer]    Social Determinants of Health   Financial Resource Strain: Low Risk   . Difficulty of Paying Living Expenses: Not hard at all  Food Insecurity: No Food Insecurity  . Worried About Charity fundraiser in the Last Year: Never true  . Ran Out of Food in the Last Year: Never true  Transportation Needs: No Transportation Needs  . Lack of Transportation (Medical): No  . Lack of Transportation (Non-Medical): No  Physical Activity: Unknown  . Days of Exercise per Week: 0 days  . Minutes of Exercise per Session: Not on file  Stress: No Stress Concern Present  . Feeling of Stress : Not at all  Social Connections:   . Frequency of Communication with Friends and Family:   . Frequency of Social Gatherings with Friends and Family:   . Attends Religious Services:   . Active Member of Clubs or Organizations:   . Attends Theatre manager Meetings:   Marland Kitchen Marital Status:   Intimate Partner Violence:   . Fear of Current or Ex-Partner:   . Emotionally Abused:   Marland Kitchen Physically Abused:   . Sexually Abused:     FAMILY HISTORY: Family History  Problem Relation Age of Onset  . Cancer Mother        Breast  . Heart disease Mother   . Stroke Mother   . Hypertension Mother   . Breast cancer Mother        58's  . Heart disease Father   . Stroke Father   . Hypertension Father   . Diabetes Father   . Colon cancer Other        paternal cousin    ALLERGIES:  has No Known Allergies.  MEDICATIONS:  Current Outpatient Medications  Medication Sig Dispense Refill  . acetaminophen (TYLENOL) 500 MG tablet Take 1,500 mg by mouth daily as needed for headache (pain).    Marland Kitchen albuterol (ACCUNEB) 0.63 MG/3ML nebulizer solution Take 3 mLs (0.63 mg total) by nebulization every 6 (six) hours as needed for wheezing. 75 mL 12  . apixaban (ELIQUIS) 2.5 MG TABS tablet Take 1 tablet (2.5 mg total) by mouth 2 (two) times daily. 60 tablet 3  . budesonide (PULMICORT) 0.5 MG/2ML nebulizer solution Take 2 mLs by nebulization 2 (two) times daily as needed (shortness of breath).     . citalopram (CELEXA) 20 MG tablet TAKE 1 TABLET(20 MG) BY MOUTH DAILY 90 tablet 1  . co-enzyme Q-10 30 MG capsule Take 30 mg by mouth daily.    Marland Kitchen losartan (COZAAR) 50 MG tablet TAKE 1 TABLET BY MOUTH ONCE A DAY (Patient taking differently: Take 50 mg by mouth daily with lunch. ) 90 tablet 1  . rosuvastatin (CRESTOR) 5 MG tablet Take 1 tablet (5 mg total) by mouth daily. (Patient taking differently: Take 5 mg by mouth daily with lunch. ) 90 tablet 1  . guaiFENesin (MUCINEX) 600 MG 12 hr tablet Take 1 tablet (600 mg total) by mouth 2 (two) times daily. (Patient not taking: Reported on 07/07/2019) 10 tablet 0   No current facility-administered medications for this visit.      Marland Kitchen  PHYSICAL EXAMINATION:  Vitals:   07/07/19 1542  BP: 131/88  Pulse: 87  Resp: 20   Temp: 98.9 F (37.2 C)   Filed Weights   07/07/19 1542  Weight: 219 lb (99.3 kg)    Physical Exam  Constitutional: She is oriented to person, place, and time and well-developed, well-nourished, and in no distress.  Obese.  In a wheelchair.  Accompanied by son.  HENT:  Head: Normocephalic and atraumatic.  Mouth/Throat: Oropharynx is clear and moist. No oropharyngeal exudate.  Eyes: Pupils are equal, round, and reactive to light.  Cardiovascular: Normal rate and regular rhythm.  Pulmonary/Chest: No respiratory distress. She has no wheezes.  Decreased air entry bilaterally.  Nasal cannula oxygen 1 L.  Abdominal: Soft. Bowel sounds are normal. She exhibits no distension and no mass.  There is no abdominal tenderness. There is no rebound and no guarding.  Musculoskeletal:        General: No tenderness or edema. Normal range of motion.     Cervical back: Normal range of motion and neck supple.  Neurological: She is alert and oriented to person, place, and time.  Skin: Skin is warm.  Psychiatric: Affect normal.     LABORATORY DATA:  I have reviewed the data as listed Lab Results  Component Value Date   WBC 7.2 07/07/2019   HGB 14.3 07/07/2019   HCT 44.5 07/07/2019   MCV 86.1 07/07/2019   PLT 276 07/07/2019   Recent Labs    11/19/18 1509 11/19/18 1509 03/18/19 1521 04/19/19 0049 04/23/19 0935 04/24/19 0942 04/26/19 0326 04/26/19 0326 04/27/19 0352 05/13/19 1518 07/07/19 1518  NA 140   < > 140   < >  --    < > 139   < > 139 137 141  K 4.4   < > 3.6   < >  --    < > 3.7   < > 3.8 3.8 3.9  CL 103   < > 103   < >  --    < > 98   < > 96* 101 105  CO2 29   < > 28   < >  --    < > 30   < > 32 25 27  GLUCOSE 88   < > 85   < >  --    < > 107*   < > 98 109* 104*  BUN 11   < > 13   < >  --    < > <5*   < > <5* 10 8  CREATININE 1.08   < > 0.99   < >  --    < > 0.74   < > 0.92 0.90 0.89  CALCIUM 9.0   < > 8.8   < >  --    < > 8.7*   < > 8.7* 8.8* 8.5*  GFRNONAA  --   --   --     < >  --    < > >60   < > >60 >60 >60  GFRAA  --   --   --    < >  --    < > >60   < > >60 >60 >60  PROT 7.0   < > 6.5   < > 5.5*   < > 6.4*  --  6.2* 7.5  --   ALBUMIN 4.4   < > 4.1   < > 2.7*   < > 3.1*  --  3.2* 4.0  --   AST 29   < > 24   < > 19   < > 18  --  19 31  --   ALT 20   < > 15   < > 12   < > 13  --  10 14  --   ALKPHOS 63   < > 58   < > 44   < > 54  --  53 64  --   BILITOT 0.8   < > 0.9   < > 2.0*   < > 1.7*  --  1.6* 1.4*  --   BILIDIR 0.1  --  0.2  --  0.3*  --   --   --   --   --   --  IBILI  --   --   --   --  1.7*  --   --   --   --   --   --    < > = values in this interval not displayed.    RADIOGRAPHIC STUDIES: I have personally reviewed the radiological images as listed and agreed with the findings in the report. No results found.  ASSESSMENT & PLAN:   Primary hypercoagulable state ([place]) # Factor V Leiden heterozygous; history of multiple DVTs-currently restarted back on Eliquis 2.5 mg twice a day; tolerating well.  Hemoglobin stable.  #Retroperitoneal hematoma 9 cm in size [risk factors-supratherapeutic anticoagulation-INR 4.5; fall]; currently back on Eliquis' clinically stable.  Concerns for new worsening hematoma.  If tolerating well would recommend going back to 5 mg twice a day at next visit.  # DISPOSITION: # follow up in 3 month- MD; cbc/bmp-Dr.B.      All questions were answered. The patient knows to call the clinic with any problems, questions or concerns.    Cammie Sickle, MD 07/11/2019 9:40 AM

## 2019-07-07 NOTE — Assessment & Plan Note (Addendum)
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  Hemoglobin stable.  #Retroperitoneal hematoma 9 cm in size [risk factors-supratherapeutic anticoagulation-INR 4.5; fall]; currently back on Eliquis' clinically stable.  Concerns for new worsening hematoma.  If tolerating well would recommend going back to 5 mg twice a day at next visit.  # DISPOSITION: # follow up in 3 month- MD; cbc/bmp-Dr.B.

## 2019-07-11 DIAGNOSIS — J449 Chronic obstructive pulmonary disease, unspecified: Secondary | ICD-10-CM | POA: Diagnosis not present

## 2019-07-24 IMAGING — CR DG CHEST 2V
1 series · 2 of 2 positions shown · non-contrast
Comparison: PA and lateral chest x-ray November 13, 2017

CLINICAL DATA: Shortness of breath and wheezing for past 3 days.
History of asthma

EXAM:
CHEST - 2 VIEW

[Series 1: dg chest 2 view · 0.14mm/px · 2 of 2 slices shown]
[im 1/2]
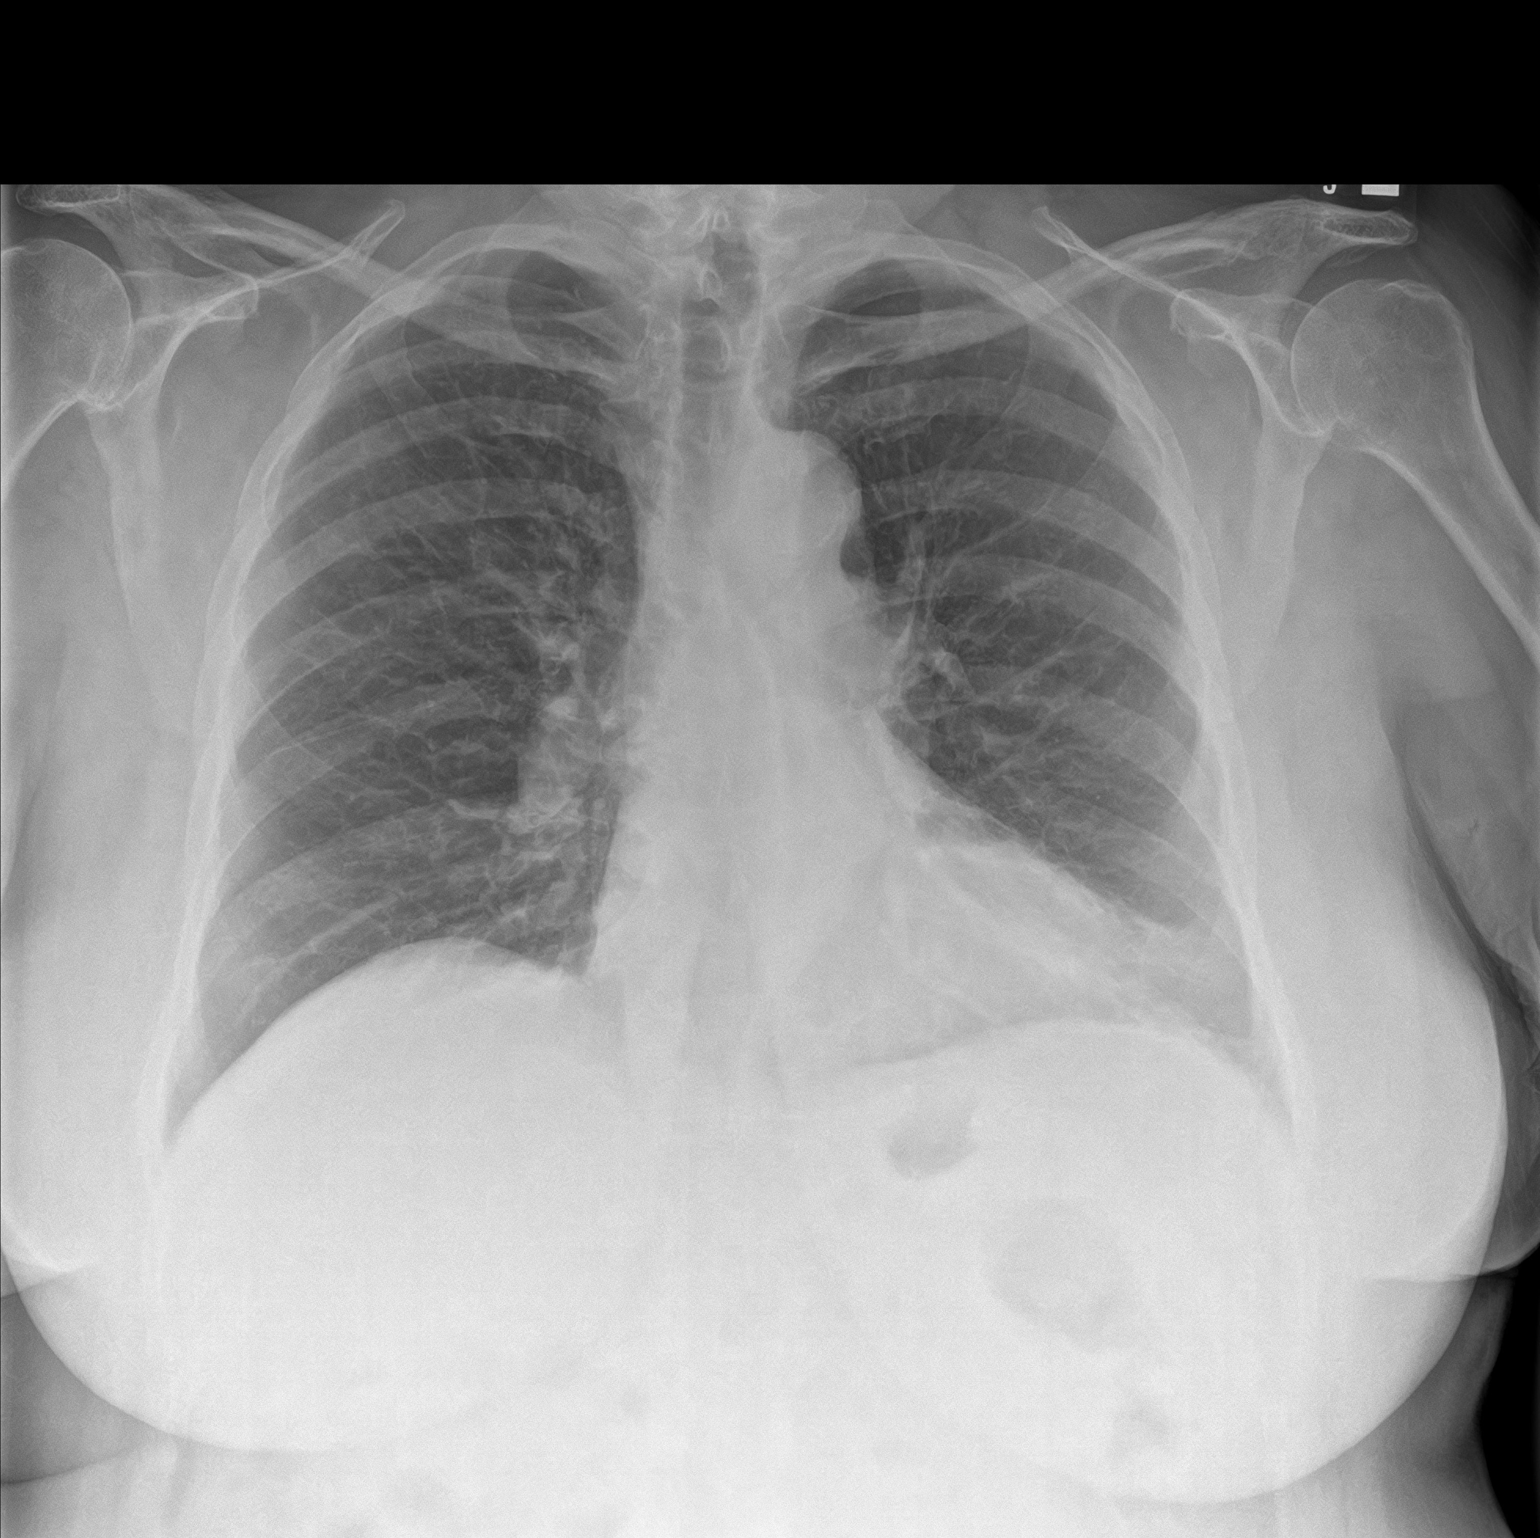
[im 2/2]
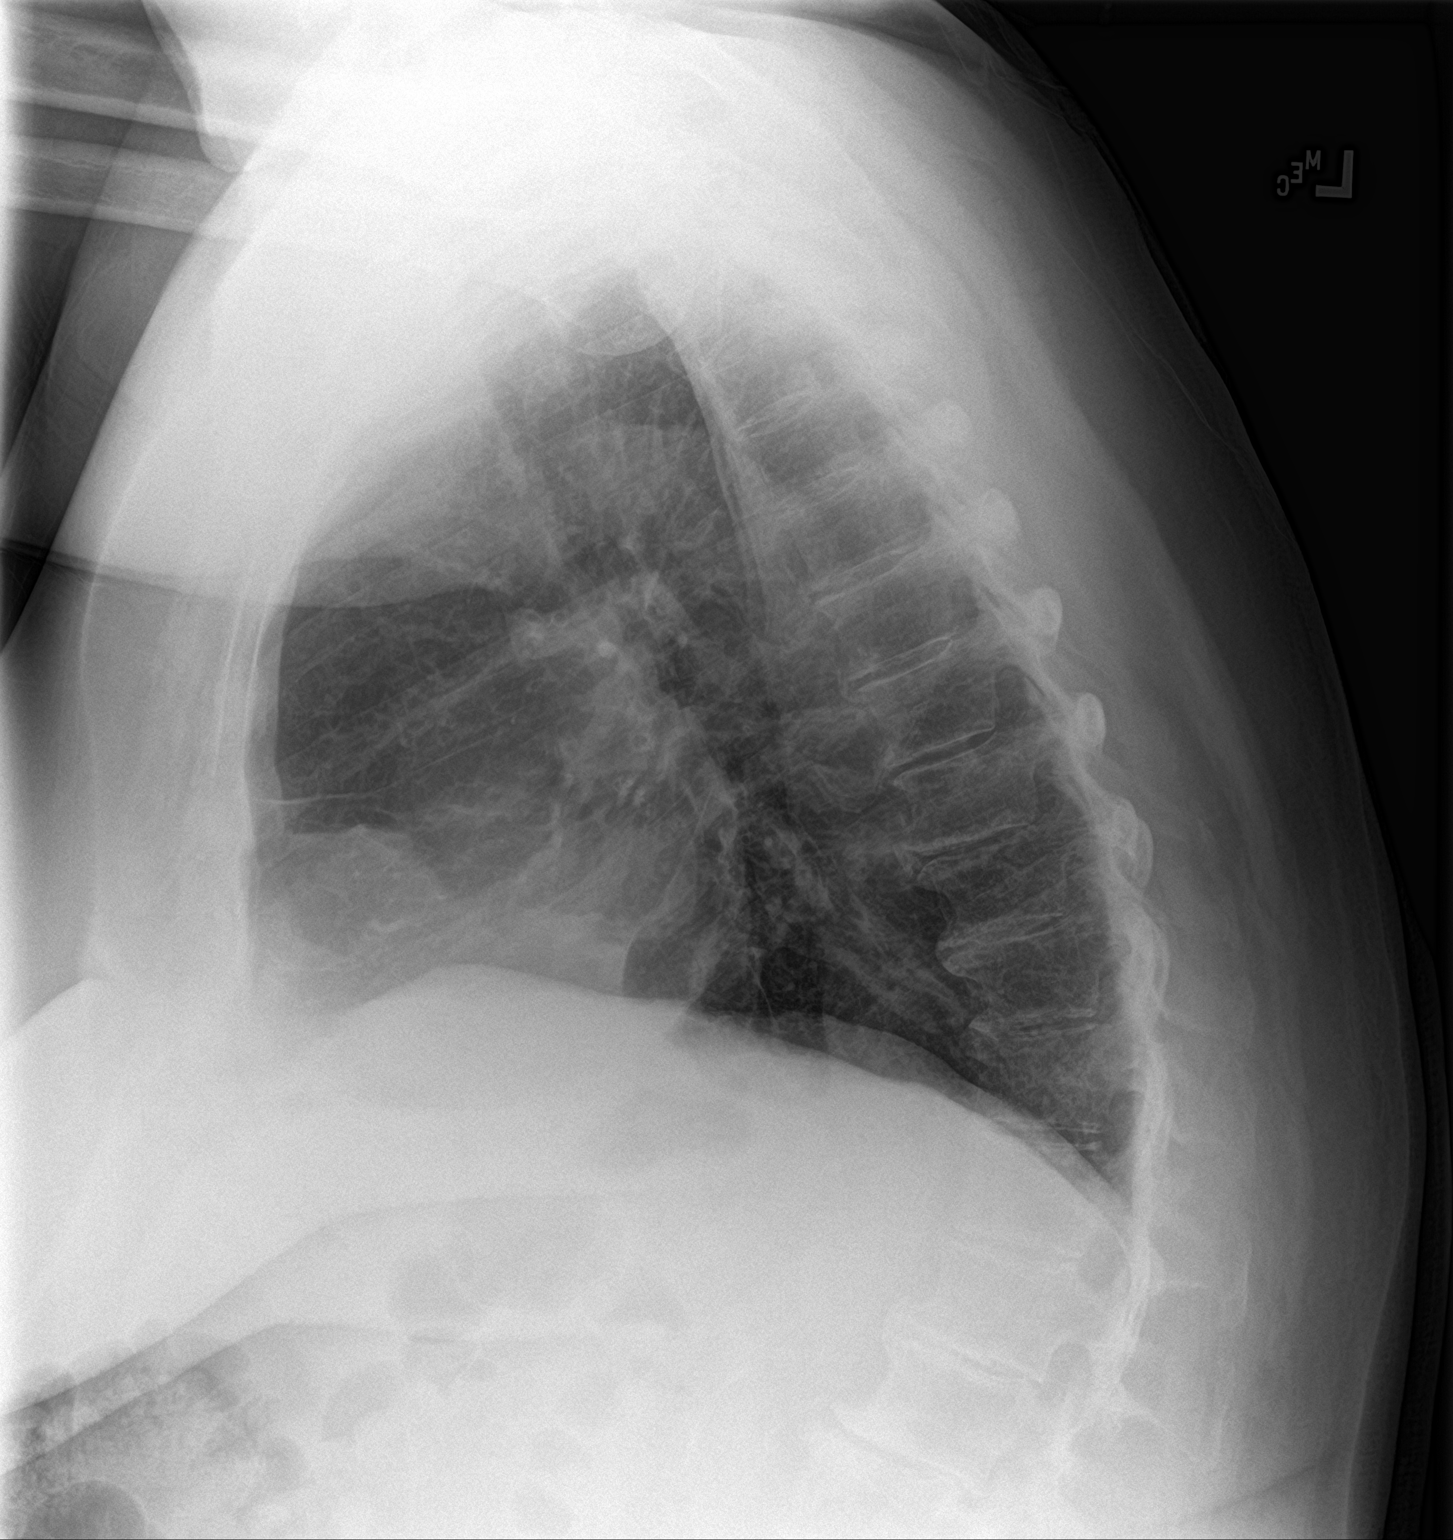

[2 of 2 positions shown; findings below may reference images not displayed]

FINDINGS: The lungs are well-expanded. There is no focal infiltrate. There is
no pleural effusion. The heart and pulmonary vascularity are normal.
There is calcification in wall of the aortic arch. The bony thorax
exhibits no acute abnormality.
IMPRESSION: There is no active cardiopulmonary disease. Mild chronic
bronchitic-reactive airway changes.

Thoracic aortic atherosclerosis.

## 2019-08-10 DIAGNOSIS — J449 Chronic obstructive pulmonary disease, unspecified: Secondary | ICD-10-CM | POA: Diagnosis not present

## 2019-08-20 ENCOUNTER — Ambulatory Visit (INDEPENDENT_AMBULATORY_CARE_PROVIDER_SITE_OTHER): Payer: Medicare Other

## 2019-08-20 VITALS — Ht 64.0 in | Wt 219.0 lb

## 2019-08-20 DIAGNOSIS — Z0000 Encounter for general adult medical examination without abnormal findings: Secondary | ICD-10-CM

## 2019-08-20 DIAGNOSIS — Z780 Asymptomatic menopausal state: Secondary | ICD-10-CM

## 2019-08-20 NOTE — Progress Notes (Addendum)
Subjective:   Tina Duarte is a 74 y.o. female who presents for Medicare Annual (Subsequent) preventive examination.  Review of Systems    No ROS.  Medicare Wellness Virtual Visit.    Cardiac Risk Factors include: advanced age (>84men, >16 women);hypertension     Objective:    Today's Vitals   08/20/19 0904  Weight: 219 lb (99.3 kg)  Height: 5\' 4"  (1.626 m)   Body mass index is 37.59 kg/m.  Advanced Directives 08/20/2019 04/23/2019 04/19/2019 11/19/2018 08/19/2018 01/31/2018 08/19/2017  Does Patient Have a Medical Advance Directive? No No No No No No No  Would patient like information on creating a medical advance directive? No - Patient declined No - Patient declined No - Patient declined Yes (MAU/Ambulatory/Procedural Areas - Information given) No - Patient declined No - Patient declined No - Patient declined    Current Medications (verified) Outpatient Encounter Medications as of 08/20/2019  Medication Sig   acetaminophen (TYLENOL) 500 MG tablet Take 1,500 mg by mouth daily as needed for headache (pain).   albuterol (ACCUNEB) 0.63 MG/3ML nebulizer solution Take 3 mLs (0.63 mg total) by nebulization every 6 (six) hours as needed for wheezing.   apixaban (ELIQUIS) 2.5 MG TABS tablet Take 1 tablet (2.5 mg total) by mouth 2 (two) times daily.   budesonide (PULMICORT) 0.5 MG/2ML nebulizer solution Take 2 mLs by nebulization 2 (two) times daily as needed (shortness of breath).    citalopram (CELEXA) 20 MG tablet TAKE 1 TABLET(20 MG) BY MOUTH DAILY   co-enzyme Q-10 30 MG capsule Take 30 mg by mouth daily.   guaiFENesin (MUCINEX) 600 MG 12 hr tablet Take 1 tablet (600 mg total) by mouth 2 (two) times daily. (Patient not taking: Reported on 07/07/2019)   losartan (COZAAR) 50 MG tablet TAKE 1 TABLET BY MOUTH ONCE A DAY (Patient taking differently: Take 50 mg by mouth daily with lunch. )   rosuvastatin (CRESTOR) 5 MG tablet Take 1 tablet (5 mg total) by mouth daily. (Patient taking  differently: Take 5 mg by mouth daily with lunch. )   No facility-administered encounter medications on file as of 08/20/2019.    Allergies (verified) Patient has no known allergies.   History: Past Medical History:  Diagnosis Date   Allergy    Asthma    Chicken pox    History of blood clots    Hypercholesterolemia    Hypertension    Retroperitoneal hematoma 03/2019   Past Surgical History:  Procedure Laterality Date   blood clots  2008   TUBAL LIGATION     Family History  Problem Relation Age of Onset   Cancer Mother        Breast   Heart disease Mother    Stroke Mother    Hypertension Mother    Breast cancer Mother        46's   Heart disease Father    Stroke Father    Hypertension Father    Diabetes Father    Colon cancer Other        paternal cousin   Social History   Socioeconomic History   Marital status: Widowed    Spouse name: Not on file   Number of children: Not on file   Years of education: Not on file   Highest education level: Not on file  Occupational History   Not on file  Tobacco Use   Smoking status: Never Smoker   Smokeless tobacco: Never Used  Vaping Use   Vaping  Use: Never used  Substance and Sexual Activity   Alcohol use: No    Alcohol/week: 0.0 standard drinks   Drug use: No   Sexual activity: Not on file  Other Topics Concern   Not on file  Social History Narrative   Lives in [place] with son.  never smoked; no alcohol. Used to work in [employer]    Social Determinants of Health   Financial Resource Strain:    Difficulty of Paying Living Expenses:   Food Insecurity:    Worried About Charity fundraiser in the Last Year:    Arboriculturist in the Last Year:   Transportation Needs: No Transportation Needs   Lack of Transportation (Medical): No   Lack of Transportation (Non-Medical): No  Physical Activity:    Days of Exercise per Week:    Minutes of Exercise per Session:     Stress:    Feeling of Stress :   Social Connections: Unknown   Frequency of Communication with Friends and Family: More than three times a week   Frequency of Social Gatherings with Friends and Family: More than three times a week   Attends Religious Services: Not on Electrical engineer or Organizations: Not on file   Attends Archivist Meetings: Not on file   Marital Status: Not on file    Tobacco Counseling Counseling given: Not Answered   Clinical Intake:  Pre-visit preparation completed: Yes        Diabetes: No  How often do you need to have someone help you when you read instructions, pamphlets, or other written materials from your doctor or pharmacy?: 1 - Never  Interpreter Needed?: No      Activities of Daily Living In your present state of health, do you have any difficulty performing the following activities: 08/20/2019 04/23/2019  Hearing? N Y  Vision? N N  Difficulty concentrating or making decisions? N Y  Walking or climbing stairs? Y Y  Comment Unsteady gait, walker in use. -  Dressing or bathing? N Y  Doing errands, shopping? Y Y  Comment She does not drive Facilities manager and eating ? N -  Using the Toilet? N -  In the past six months, have you accidently leaked urine? N -  Do you have problems with loss of bowel control? N -  Managing your Medications? N -  Managing your Finances? N -  Housekeeping or managing your Housekeeping? N -  Some recent data might be hidden    Patient Care Team: Einar Pheasant, MD as PCP - General (Internal Medicine)  Indicate any recent Medical Services you may have received from other than [facility] providers in the past year (date may be approximate).     Assessment:   This is a routine wellness examination for Tina Duarte.  I connected with Tina Duarte today by telephone and verified that I am speaking with the correct person using two identifiers. Location patient: home Location provider:  work Persons participating in the virtual visit: patient, Tina Duarte.    I discussed the limitations, risks, security and privacy concerns of performing an evaluation and management service by telephone and the availability of in person appointments. The patient expressed understanding and verbally consented to this telephonic visit.    Interactive audio and video telecommunications were attempted between this provider and patient, however failed, due to patient having technical difficulties OR patient did not have access to video capability.  We continued and completed  visit with audio only.  Some vital signs may be absent or patient reported.   Hearing/Vision screen  Hearing Screening   125Hz  250Hz  500Hz  1000Hz  2000Hz  3000Hz  4000Hz  6000Hz  8000Hz   Right ear:           Left ear:           Comments: Patient is able to hear conversational tones without difficulty.  No issues reported.   Vision Screening Comments: Wears corrective lenses Visual acuity not assessed, virtual visit.  They have seen their ophthalmologist in the last 12 months.    Dietary issues and exercise activities discussed: Current Exercise Habits: The patient does not participate in regular exercise at present  Goals      Patient Stated     Weight (lb) < 230 lb (104.3 kg) (pt-stated)      Healthier diet      Depression Screen PHQ 2/9 Scores 08/20/2019 11/19/2018 08/19/2018 08/14/2017 12/21/2015 09/15/2014 10/21/2012  PHQ - 2 Score 0 1 0 0 0 0 0  PHQ- 9 Score - 6 - - - - -    Fall Risk Fall Risk  08/20/2019 11/19/2018 08/19/2018 08/14/2017 12/21/2015  Falls in the past year? 0 1 1 No No  Number falls in past yr: - 0 0 - -  Injury with Fall? - 0 0 - -  Risk for fall due to : - Impaired mobility - - -  Follow up Falls evaluation completed Falls evaluation completed;Falls prevention discussed;Education provided - - -    Handrails in use when climbing stairs? Yes  Home free of loose throw rugs in walkways, pet beds,  electrical cords, etc? Yes  Adequate lighting in your home to reduce risk of falls? Yes   ASSISTIVE DEVICES UTILIZED TO PREVENT FALLS: Life alert? No  Use of a cane, walker or w/c? Yes  Grab bars in the bathroom? Yes  Shower chair or bench in shower? No  Elevated toilet seat or a handicapped toilet? No   TIMED UP AND GO:  Was the test performed? No . Virtual visit.  Cognitive Function:  Patient is alert and oriented x3.   6CIT Screen 08/20/2019 08/19/2018 08/14/2017  What Year? 0 points 0 points 0 points  What month? 0 points 0 points 0 points  What time? - 0 points 0 points  Count back from 20 - 0 points 0 points  Months in reverse 2 points 0 points 0 points  Repeat phrase 0 points 0 points 0 points  Total Score - 0 0    Immunizations Immunization History  Administered Date(s) Administered   Fluad Quad(high Dose 65+) 12/11/2018   Influenza, High Dose Seasonal PF 04/10/2017   Influenza,inj,Quad PF,6+ Mos 10/21/2012, 01/07/2014, 03/07/2016   Influenza,inj,quad, With Preservative 03/02/2016   Influenza-Unspecified 02/28/2011, 12/16/2014   Pneumococcal Conjugate-13 04/30/2010   Pneumococcal Polysaccharide-23 05/23/2016   Health Maintenance Health Maintenance  Topic Date Due   MAMMOGRAM  03/14/2017   COVID-19 Vaccine (1) 09/05/2019 (Originally 10/08/1957)   INFLUENZA VACCINE  08/31/2019   TETANUS/TDAP  02/08/2020   COLONOSCOPY  04/29/2020   DEXA SCAN  Completed   Hepatitis C Screening  Completed   PNA vac Low Risk Adult  Completed   Covid vaccine- declined  Mammogram- ordered, number provided for scheduling.   Dental Screening: Recommended annual dental exams for proper oral hygiene  Community Resource Referral / Chronic Care Management: CRR required this visit?  No   CCM required this visit?  No      Plan:  Keep all routine maintenance appointments.   I have personally reviewed and noted the following in the patients chart:    Medical and  social history  Use of alcohol, tobacco or illicit drugs   Current medications and supplements  Functional ability and status  Nutritional status  Physical activity  Advanced directives  List of other physicians  Hospitalizations, surgeries, and ER visits in previous 12 months  Vitals  Screenings to include cognitive, depression, and falls  Referrals and appointments  In addition, I have reviewed and discussed with patient certain preventive protocols, quality metrics, and best practice recommendations. A written personalized care plan for preventive services as well as general preventive health recommendations were provided to patient via mychart.     Varney Biles, LPN   5/50/1586     Reviewed above information.  Agree with assessment and plan.    Dr Nicki Reaper

## 2019-08-20 NOTE — Patient Instructions (Addendum)
Tina Duarte , Thank you for taking time to come for your Medicare Wellness Visit. I appreciate your ongoing commitment to your health goals. Please review the following plan we discussed and let me know if I can assist you in the future.   These are the goals we discussed: Goals      Patient Stated   .  Weight (lb) < 230 lb (104.3 kg) (pt-stated)      Healthier diet       This is a list of the screening recommended for you and due dates:  Health Maintenance  Topic Date Due  . Mammogram  03/14/2017  . COVID-19 Vaccine (1) 09/05/2019*  . Flu Shot  08/31/2019  . Tetanus Vaccine  02/08/2020  . Colon Cancer Screening  04/29/2020  . DEXA scan (bone density measurement)  Completed  .  Hepatitis C: One time screening is recommended by Center for Disease Control  (CDC) for  adults born from [birth year] through 1967.   Completed  . Pneumonia vaccines  Completed  *Topic was postponed. The date shown is not the original due date.    Immunizations Immunization History  Administered Date(s) Administered  . Fluad Quad(high Dose 65+) 12/11/2018  . Influenza, High Dose Seasonal PF 04/10/2017  . Influenza,inj,Quad PF,6+ Mos 10/21/2012, 01/07/2014, 03/07/2016  . Influenza,inj,quad, With Preservative 03/02/2016  . Influenza-Unspecified 02/28/2011, 12/16/2014  . Pneumococcal Conjugate-13 04/30/2010  . Pneumococcal Polysaccharide-23 05/23/2016   Advanced directives: declined  Conditions/risks identified: none new  Follow up in one year for your annual wellness visit   Preventive Care 65 Years and Older, Female Preventive care refers to lifestyle choices and visits with your health care provider that can promote health and wellness. What does preventive care include?  A yearly physical exam. This is also called an annual well check.  Dental exams once or twice a year.  Routine eye exams. Ask your health care provider how often you should have your eyes checked.  Personal lifestyle choices,  including:  Daily care of your teeth and gums.  Regular physical activity.  Eating a healthy diet.  Avoiding tobacco and drug use.  Limiting alcohol use.  Practicing safe sex.  Taking low-dose aspirin every day.  Taking vitamin and mineral supplements as recommended by your health care provider. What happens during an annual well check? The services and screenings done by your health care provider during your annual well check will depend on your age, overall health, lifestyle risk factors, and family history of disease. Counseling  Your health care provider may ask you questions about your:  Alcohol use.  Tobacco use.  Drug use.  Emotional well-being.  Home and relationship well-being.  Sexual activity.  Eating habits.  History of falls.  Memory and ability to understand (cognition).  Work and work Statistician.  Reproductive health. Screening  You may have the following tests or measurements:  Height, weight, and BMI.  Blood pressure.  Lipid and cholesterol levels. These may be checked every 5 years, or more frequently if you are over 63 years old.  Skin check.  Lung cancer screening. You may have this screening every year starting at age 34 if you have a 30-pack-year history of smoking and currently smoke or have quit within the past 15 years.  Fecal occult blood test (FOBT) of the stool. You may have this test every year starting at age 64.  Flexible sigmoidoscopy or colonoscopy. You may have a sigmoidoscopy every 5 years or a colonoscopy every 10 years starting  at age 52.  Hepatitis C blood test.  Hepatitis B blood test.  Sexually transmitted disease (STD) testing.  Diabetes screening. This is done by checking your blood sugar (glucose) after you have not eaten for a while (fasting). You may have this done every 1-3 years.  Bone density scan. This is done to screen for osteoporosis. You may have this done starting at age 3.  Mammogram. This  may be done every 1-2 years. Talk to your health care provider about how often you should have regular mammograms. Talk with your health care provider about your test results, treatment options, and if necessary, the need for more tests. Vaccines  Your health care provider may recommend certain vaccines, such as:  Influenza vaccine. This is recommended every year.  Tetanus, diphtheria, and acellular pertussis (Tdap, Td) vaccine. You may need a Td booster every 10 years.  Zoster vaccine. You may need this after age 40.  Pneumococcal 13-valent conjugate (PCV13) vaccine. One dose is recommended after age 49.  Pneumococcal polysaccharide (PPSV23) vaccine. One dose is recommended after age 10. Talk to your health care provider about which screenings and vaccines you need and how often you need them. This information is not intended to replace advice given to you by your health care provider. Make sure you discuss any questions you have with your health care provider. Document Released: 02/12/2015 Document Revised: 10/06/2015 Document Reviewed: 11/17/2014 Elsevier Interactive Patient Education  2017 [place] Prevention in the Home Falls can cause injuries. They can happen to people of all ages. There are many things you can do to make your home safe and to help prevent falls. What can I do on the outside of my home?  Regularly fix the edges of walkways and driveways and fix any cracks.  Remove anything that might make you trip as you walk through a door, such as a raised step or threshold.  Trim any bushes or trees on the path to your home.  Use bright outdoor lighting.  Clear any walking paths of anything that might make someone trip, such as rocks or tools.  Regularly check to see if handrails are loose or broken. Make sure that both sides of any steps have handrails.  Any raised decks and porches should have guardrails on the edges.  Have any leaves, snow, or ice cleared  regularly.  Use sand or salt on walking paths during winter.  Clean up any spills in your garage right away. This includes oil or grease spills. What can I do in the bathroom?  Use night lights.  Install grab bars by the toilet and in the tub and shower. Do not use towel bars as grab bars.  Use non-skid mats or decals in the tub or shower.  If you need to sit down in the shower, use a plastic, non-slip stool.  Keep the floor dry. Clean up any water that spills on the floor as soon as it happens.  Remove soap buildup in the tub or shower regularly.  Attach bath mats securely with double-sided non-slip rug tape.  Do not have throw rugs and other things on the floor that can make you trip. What can I do in the bedroom?  Use night lights.  Make sure that you have a light by your bed that is easy to reach.  Do not use any sheets or blankets that are too big for your bed. They should not hang down onto the floor.  Have a firm  chair that has side arms. You can use this for support while you get dressed.  Do not have throw rugs and other things on the floor that can make you trip. What can I do in the kitchen?  Clean up any spills right away.  Avoid walking on wet floors.  Keep items that you use a lot in easy-to-reach places.  If you need to reach something above you, use a strong step stool that has a grab bar.  Keep electrical cords out of the way.  Do not use floor polish or wax that makes floors slippery. If you must use wax, use non-skid floor wax.  Do not have throw rugs and other things on the floor that can make you trip. What can I do with my stairs?  Do not leave any items on the stairs.  Make sure that there are handrails on both sides of the stairs and use them. Fix handrails that are broken or loose. Make sure that handrails are as long as the stairways.  Check any carpeting to make sure that it is firmly attached to the stairs. Fix any carpet that is loose  or worn.  Avoid having throw rugs at the top or bottom of the stairs. If you do have throw rugs, attach them to the floor with carpet tape.  Make sure that you have a light switch at the top of the stairs and the bottom of the stairs. If you do not have them, ask someone to add them for you. What else can I do to help prevent falls?  Wear shoes that:  Do not have high heels.  Have rubber bottoms.  Are comfortable and fit you well.  Are closed at the toe. Do not wear sandals.  If you use a stepladder:  Make sure that it is fully opened. Do not climb a closed stepladder.  Make sure that both sides of the stepladder are locked into place.  Ask someone to hold it for you, if possible.  Clearly mark and make sure that you can see:  Any grab bars or handrails.  First and last steps.  Where the edge of each step is.  Use tools that help you move around (mobility aids) if they are needed. These include:  Canes.  Walkers.  Scooters.  Crutches.  Turn on the lights when you go into a dark area. Replace any light bulbs as soon as they burn out.  Set up your furniture so you have a clear path. Avoid moving your furniture around.  If any of your floors are uneven, fix them.  If there are any pets around you, be aware of where they are.  Review your medicines with your doctor. Some medicines can make you feel dizzy. This can increase your chance of falling. Ask your doctor what other things that you can do to help prevent falls. This information is not intended to replace advice given to you by your health care provider. Make sure you discuss any questions you have with your health care provider. Document Released: 11/12/2008 Document Revised: 06/24/2015 Document Reviewed: 02/20/2014 Elsevier Interactive Patient Education  2017 [place] A mammogram is a low energy X-ray of the breasts that is done to check for abnormal changes. This procedure can screen for  and detect any changes that may indicate breast cancer. Mammograms are regularly done on women. A man may have a mammogram if he has a lump or swelling in his breast. A mammogram  can also identify other changes and variations in the breast, such as:  Inflammation of the breast tissue (mastitis).  An infected area that contains a collection of pus (abscess).  A fluid-filled sac (cyst).  Fibrocystic changes. This is when breast tissue becomes denser, which can make the tissue feel rope-like or uneven under the skin.  Tumors that are not cancerous (benign). Tell a health care provider:  About any allergies you have.  If you have breast implants.  If you have had previous breast disease, biopsy, or surgery.  If you are breastfeeding.  If you are younger than age 42.  If you have a family history of breast cancer.  Whether you are pregnant or may be pregnant. What are the risks? Generally, this is a safe procedure. However, problems may occur, including:  Exposure to radiation. Radiation levels are very low with this test.  The results being misinterpreted.  The need for further tests.  The inability of the mammogram to detect certain cancers. What happens before the procedure?  Schedule your test about 1-2 weeks after your menstrual period if you are still menstruating. This is usually when your breasts are the least tender.  If you have had a mammogram done at a different facility in the past, get the mammogram X-rays or have them sent to your current exam facility. The new and old images will be compared.  Wash your breasts and underarms on the day of the test.  Do not wear deodorants, perfumes, lotions, or powders anywhere on your body on the day of the test.  Remove any jewelry from your neck.  Wear clothes that you can change into and out of easily. What happens during the procedure?   You will undress from the waist up and put on a gown that opens in the  front.  You will stand in front of the X-ray machine.  Each breast will be placed between two plastic or glass plates. The plates will compress your breast for a few seconds. Try to stay as relaxed as possible during the procedure. This does not cause any harm to your breasts and any discomfort you feel will be very brief.  X-rays will be taken from different angles of each breast. The procedure may vary among health care providers and hospitals. What happens after the procedure?  The mammogram will be examined by a specialist (radiologist).  You may need to repeat certain parts of the test, depending on the quality of the images. This is commonly done if the radiologist needs a better view of the breast tissue.  You may resume your normal activities.  It is up to you to get the results of your procedure. Ask your health care provider, or the department that is doing the procedure, when your results will be ready. Summary  A mammogram is a low energy X-ray of the breasts that is done to check for abnormal changes. A man may have a mammogram if he has a lump or swelling in his breast.  If you have had a mammogram done at a different facility in the past, get the mammogram X-rays or have them sent to your current exam facility in order to compare them.  Schedule your test about 1-2 weeks after your menstrual period if you are still menstruating.  For this test, each breast will be placed between two plastic or glass plates. The plates will compress your breast for a few seconds.  Ask when your test results will be  ready. Make sure you get your test results. This information is not intended to replace advice given to you by your health care provider. Make sure you discuss any questions you have with your health care provider. Document Revised: 09/06/2017 Document Reviewed: 09/06/2017 Elsevier Patient Education  [place].

## 2019-08-22 ENCOUNTER — Ambulatory Visit: Payer: Medicare Other | Admitting: Pulmonary Disease

## 2019-08-26 ENCOUNTER — Telehealth: Payer: Self-pay | Admitting: Internal Medicine

## 2019-08-26 NOTE — Telephone Encounter (Signed)
Rejection Reason - Patient was No Show"  Pulmonary at [facility] said 4 days ago

## 2019-09-10 DIAGNOSIS — J449 Chronic obstructive pulmonary disease, unspecified: Secondary | ICD-10-CM | POA: Diagnosis not present

## 2019-09-18 ENCOUNTER — Emergency Department
Admission: EM | Admit: 2019-09-18 | Discharge: 2019-09-18 | Disposition: A | Discharge status: Home / Self Care | Payer: Medicare Other | Attending: Emergency Medicine | Admitting: Emergency Medicine

## 2019-09-18 ENCOUNTER — Other Ambulatory Visit: Payer: Self-pay

## 2019-09-18 DIAGNOSIS — R531 Weakness: Secondary | ICD-10-CM | POA: Diagnosis not present

## 2019-09-18 DIAGNOSIS — R069 Unspecified abnormalities of breathing: Secondary | ICD-10-CM | POA: Diagnosis not present

## 2019-09-18 DIAGNOSIS — Z743 Need for continuous supervision: Secondary | ICD-10-CM | POA: Diagnosis not present

## 2019-09-18 DIAGNOSIS — Z5321 Procedure and treatment not carried out due to patient leaving prior to being seen by health care provider: Secondary | ICD-10-CM | POA: Insufficient documentation

## 2019-09-18 HISTORY — DX: Chronic obstructive pulmonary disease, unspecified: J44.9

## 2019-09-18 LAB — CBC
HCT: 43.5 % (ref 36.0–46.0)
Hemoglobin: 14.6 g/dL (ref 12.0–15.0)
MCH: 27.7 pg (ref 26.0–34.0)
MCHC: 33.6 g/dL (ref 30.0–36.0)
MCV: 82.5 fL (ref 80.0–100.0)
Platelets: 251 10*3/uL (ref 150–400)
RBC: 5.27 MIL/uL — ABNORMAL HIGH (ref 3.87–5.11)
RDW: 14.7 % (ref 11.5–15.5)
WBC: 6 10*3/uL (ref 4.0–10.5)
nRBC: 0 % (ref 0.0–0.2)

## 2019-09-18 LAB — BASIC METABOLIC PANEL
Anion gap: 12 (ref 5–15)
BUN: 19 mg/dL (ref 8–23)
CO2: 20 mmol/L — ABNORMAL LOW (ref 22–32)
Calcium: 8 mg/dL — ABNORMAL LOW (ref 8.9–10.3)
Chloride: 99 mmol/L (ref 98–111)
Creatinine, Ser: 1.06 mg/dL — ABNORMAL HIGH (ref 0.44–1.00)
GFR calc Af Amer: 59 mL/min — ABNORMAL LOW (ref >60)
GFR calc non Af Amer: 51 mL/min — ABNORMAL LOW (ref >60)
Glucose, Bld: 127 mg/dL — ABNORMAL HIGH (ref 70–99)
Potassium: 4 mmol/L (ref 3.5–5.1)
Sodium: 131 mmol/L — ABNORMAL LOW (ref 135–145)

## 2019-09-18 NOTE — ED Triage Notes (Signed)
First Nurse Note: Arrives from home with c/o breathing difficulties.  VS wnl.  20 g LAC..  Wears home oxygen 4l/ [place]

## 2019-09-18 NOTE — ED Triage Notes (Signed)
Pt comes via [facility] from home with c/o weakness. Pt denies any SOB. Pt states she just has been feeling weak for about a week.  Pt denies any CP/  Pt wears 4L [place] chronically

## 2019-09-19 ENCOUNTER — Other Ambulatory Visit: Payer: Self-pay | Admitting: Internal Medicine

## 2019-09-22 ENCOUNTER — Telehealth: Payer: Self-pay | Admitting: Emergency Medicine

## 2019-09-22 NOTE — Telephone Encounter (Signed)
Called patient due to lwot to inquire about condition and follow up plans. She says she feels better now.  I told her to call her doctor anyway and have them look at lab result.

## 2019-10-01 ENCOUNTER — Telehealth: Payer: Self-pay | Admitting: Internal Medicine

## 2019-10-01 DIAGNOSIS — E871 Hypo-osmolality and hyponatremia: Secondary | ICD-10-CM

## 2019-10-01 NOTE — Telephone Encounter (Signed)
Message from ED nurse:  Called patient due to lwot to inquire about condition and follow up plans. She says she feels better now.  I told her to call her doctor anyway and have them look at lab result.

## 2019-10-01 NOTE — Telephone Encounter (Signed)
Patient went to hospital for diarrhea and she was advise to call her doctor.

## 2019-10-02 NOTE — Telephone Encounter (Signed)
It appears that she went to ER - not feeling well.  Need to clarify her symptoms and how she is feeling now.  One nurse message stated no breathing problems and the other reported ? SOB.  Apparently was not seen.  Had labs drawn.  Sodium low.  Need to know how she is doing now and any symptoms having.  Needs a f/u met b.  (may need at [facility] outpatient - pending symptoms).

## 2019-10-02 NOTE — Telephone Encounter (Signed)
Patients son that lives with her tested positive for COVID last Sunday. Pt went to the ED for diarrhea and weakness on 8/19. Was not tested for covid. Confirmed feeling better now. No acute symptoms at this time. She is quarantining herself from her son. Patient is seeing Dr Tish Men on Tuesday. She would like to have her met b checked then. Everyone will be out of quarantine by then.

## 2019-10-03 NOTE — Telephone Encounter (Signed)
Reviewed note, given symptoms and given son positive for covid, will assume she had/has covid.  Continue quarantine.  I have placed order for met b.  Need to notify Dr Aletha Halim office of her clinic status to confirm their procedure/policy of in office appts.  Will need f/u lab - can do at [facility] if not going in for appt.

## 2019-10-07 ENCOUNTER — Inpatient Hospital Stay: Payer: Medicare Other | Attending: Internal Medicine

## 2019-10-07 ENCOUNTER — Encounter: Payer: Self-pay | Admitting: Internal Medicine

## 2019-10-07 ENCOUNTER — Inpatient Hospital Stay (HOSPITAL_BASED_OUTPATIENT_CLINIC_OR_DEPARTMENT_OTHER): Payer: Medicare Other | Admitting: Internal Medicine

## 2019-10-07 ENCOUNTER — Other Ambulatory Visit: Payer: Self-pay

## 2019-10-07 VITALS — BP 125/90 | HR 101 | Temp 98.0°F | Resp 16 | Ht 64.0 in | Wt 218.0 lb

## 2019-10-07 DIAGNOSIS — D6859 Other primary thrombophilia: Secondary | ICD-10-CM | POA: Diagnosis not present

## 2019-10-07 DIAGNOSIS — Z86718 Personal history of other venous thrombosis and embolism: Secondary | ICD-10-CM | POA: Insufficient documentation

## 2019-10-07 DIAGNOSIS — K661 Hemoperitoneum: Secondary | ICD-10-CM | POA: Diagnosis not present

## 2019-10-07 DIAGNOSIS — Z7901 Long term (current) use of anticoagulants: Secondary | ICD-10-CM | POA: Diagnosis not present

## 2019-10-07 LAB — CBC WITH DIFFERENTIAL/PLATELET
Abs Immature Granulocytes: 0.02 10*3/uL (ref 0.00–0.07)
Basophils Absolute: 0.1 10*3/uL (ref 0.0–0.1)
Basophils Relative: 1 %
Eosinophils Absolute: 0.4 10*3/uL (ref 0.0–0.5)
Eosinophils Relative: 4 %
HCT: 46.2 % — ABNORMAL HIGH (ref 36.0–46.0)
Hemoglobin: 15 g/dL (ref 12.0–15.0)
Immature Granulocytes: 0 %
Lymphocytes Relative: 47 %
Lymphs Abs: 4.4 10*3/uL — ABNORMAL HIGH (ref 0.7–4.0)
MCH: 28 pg (ref 26.0–34.0)
MCHC: 32.5 g/dL (ref 30.0–36.0)
MCV: 86.4 fL (ref 80.0–100.0)
Monocytes Absolute: 0.6 10*3/uL (ref 0.1–1.0)
Monocytes Relative: 6 %
Neutro Abs: 3.9 10*3/uL (ref 1.7–7.7)
Neutrophils Relative %: 42 %
Platelets: 311 10*3/uL (ref 150–400)
RBC: 5.35 MIL/uL — ABNORMAL HIGH (ref 3.87–5.11)
RDW: 15.1 % (ref 11.5–15.5)
WBC: 9.5 10*3/uL (ref 4.0–10.5)
nRBC: 0 % (ref 0.0–0.2)

## 2019-10-07 LAB — BASIC METABOLIC PANEL
Anion gap: 10 (ref 5–15)
BUN: 12 mg/dL (ref 8–23)
CO2: 26 mmol/L (ref 22–32)
Calcium: 8.9 mg/dL (ref 8.9–10.3)
Chloride: 102 mmol/L (ref 98–111)
Creatinine, Ser: 1.08 mg/dL — ABNORMAL HIGH (ref 0.44–1.00)
GFR calc Af Amer: 58 mL/min — ABNORMAL LOW (ref >60)
GFR calc non Af Amer: 50 mL/min — ABNORMAL LOW (ref >60)
Glucose, Bld: 111 mg/dL — ABNORMAL HIGH (ref 70–99)
Potassium: 4.4 mmol/L (ref 3.5–5.1)
Sodium: 138 mmol/L (ref 135–145)

## 2019-10-07 NOTE — Assessment & Plan Note (Signed)
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  Hemoglobin-hemoglobin stable at 13.  #Retroperitoneal hematoma 9 cm in size [risk factors-supratherapeutic anticoagulation-INR 4.5; fall]; currently back on Eliquis' clinically stable.  Continue Eliquis at 2.5 mg twice daily.  # DISPOSITION: # follow up in 6 month- MD; cbc/bmp-Dr.B.

## 2019-10-07 NOTE — Progress Notes (Signed)
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  May 2021-improvement of the hematoma.  # COPD 1 lit/ [place]   Oncology History   No history exists.     HISTORY OF PRESENTING ILLNESS:  Tina Duarte Filter 74 y.o.  female history of factor V heterozygous; and history of recurrent thromboembolic events; s/p retroperitoneal hematoma status post fall March 2021 is here for follow-up.  Patient restarted back on Eliquis 2.5 mg twice a day denies any abdominal pain.  Denies any nausea vomiting.  No falls. Review of Systems  Constitutional: Negative for chills, diaphoresis, fever, malaise/fatigue and weight loss.  HENT: Negative for nosebleeds and sore throat.   Eyes: Negative for double vision.  Respiratory: Positive for shortness of breath. Negative for cough, hemoptysis, sputum production and wheezing.   Cardiovascular: Negative for chest pain, palpitations, orthopnea and leg swelling.  Gastrointestinal: Negative for abdominal pain, blood in stool, constipation, diarrhea, heartburn, melena, nausea and vomiting.  Genitourinary: Negative for dysuria, frequency and urgency.  Musculoskeletal: Positive for back pain and joint pain.  Skin: Negative.  Negative for itching and rash.  Neurological: Negative for dizziness, tingling, focal weakness, weakness and headaches.  Endo/Heme/Allergies: Does not bruise/bleed easily.  Psychiatric/Behavioral: Negative for depression. The patient is not nervous/anxious and does not have insomnia.      MEDICAL HISTORY:  Past Medical History:  Diagnosis Date  . Allergy   . Asthma    . Chicken pox   . COPD (chronic obstructive pulmonary disease) ([place])   . History of blood clots   . Hypercholesterolemia   . Hypertension   . Retroperitoneal hematoma 03/2019    SURGICAL HISTORY: Past Surgical History:  Procedure Laterality Date  . blood clots  2008  . TUBAL LIGATION      SOCIAL HISTORY: Social History   Socioeconomic History  . Marital status: Widowed    Spouse name: Not on file  . Number of children: Not on file  . Years of education: Not on file  . Highest education level: Not on file  Occupational History  . Not on file  Tobacco Use  . Smoking status: Never Smoker  . Smokeless tobacco: Never Used  Vaping Use  . Vaping Use: Never used  Substance and Sexual Activity  . Alcohol use: No    Alcohol/week: 0.0 standard drinks  . Drug use: No  . Sexual activity: Not on file  Other Topics Concern  . Not on file  Social History Narrative   Lives in [place] with son.  never smoked; no alcohol. Used to work in [employer]    Social Determinants of Health   Financial Resource Strain:   . Difficulty of Paying Living Expenses: Not on file  Food Insecurity:   . Worried About Charity fundraiser in the Last Year: Not on file  . Ran Out of Food in the Last Year: Not on file  Transportation Needs: No Transportation Needs  . Lack of Transportation (Medical): No  . Lack of Transportation (Non-Medical): No  Physical Activity:   . Days of Exercise per Week: Not on file  . Minutes of Exercise per Session: Not on file  Stress:   .  Feeling of Stress : Not on file  Social Connections: Unknown  . Frequency of Communication with Friends and Family: More than three times a week  . Frequency of Social Gatherings with Friends and Family: More than three times a week  . Attends Religious Services: Not on file  . Active Member of Clubs or Organizations: Not on file  . Attends Archivist Meetings: Not on file  . Marital Status: Not on file  Intimate  Partner Violence:   . Fear of Current or Ex-Partner: Not on file  . Emotionally Abused: Not on file  . Physically Abused: Not on file  . Sexually Abused: Not on file    FAMILY HISTORY: Family History  Problem Relation Age of Onset  . Cancer Mother        Breast  . Heart disease Mother   . Stroke Mother   . Hypertension Mother   . Breast cancer Mother        25's  . Heart disease Father   . Stroke Father   . Hypertension Father   . Diabetes Father   . Colon cancer Other        paternal cousin    ALLERGIES:  has No Known Allergies.  MEDICATIONS:  Current Outpatient Medications  Medication Sig Dispense Refill  . acetaminophen (TYLENOL) 500 MG tablet Take 1,500 mg by mouth daily as needed for headache (pain).    Marland Kitchen albuterol (ACCUNEB) 0.63 MG/3ML nebulizer solution Take 3 mLs (0.63 mg total) by nebulization every 6 (six) hours as needed for wheezing. 75 mL 12  . apixaban (ELIQUIS) 2.5 MG TABS tablet Take 1 tablet (2.5 mg total) by mouth 2 (two) times daily. 60 tablet 3  . budesonide (PULMICORT) 0.5 MG/2ML nebulizer solution Take 2 mLs by nebulization 2 (two) times daily as needed (shortness of breath).     . citalopram (CELEXA) 20 MG tablet TAKE 1 TABLET(20 MG) BY MOUTH DAILY 90 tablet 1  . co-enzyme Q-10 30 MG capsule Take 30 mg by mouth daily.    Marland Kitchen losartan (COZAAR) 50 MG tablet Take 1 tablet (50 mg total) by mouth daily with lunch. 30 tablet 5  . rosuvastatin (CRESTOR) 5 MG tablet Take 1 tablet (5 mg total) by mouth daily. (Patient taking differently: Take 5 mg by mouth daily with lunch. ) 90 tablet 1  . guaiFENesin (MUCINEX) 600 MG 12 hr tablet Take 1 tablet (600 mg total) by mouth 2 (two) times daily. (Patient not taking: Reported on 07/07/2019) 10 tablet 0   No current facility-administered medications for this visit.      Marland Kitchen  PHYSICAL EXAMINATION:  Vitals:   10/07/19 1426  BP: 125/90  Pulse: (!) 101  Resp: 16  Temp: 98 F (36.7 C)  SpO2: 98%   Filed Weights    10/07/19 1426  Weight: 218 lb (98.9 kg)    Physical Exam Constitutional:      Comments: Obese.  Tina Duarte is alone.  Walk with a rolling walker.  HENT:     Head: Normocephalic and atraumatic.     Mouth/Throat:     Pharynx: No oropharyngeal exudate.  Eyes:     Pupils: Pupils are equal, round, and reactive to light.  Cardiovascular:     Rate and Rhythm: Normal rate and regular rhythm.  Pulmonary:     Effort: No respiratory distress.     Breath sounds: No wheezing.  Abdominal:     General: Bowel sounds are normal. There is no distension.  Palpations: Abdomen is soft. There is no mass.     Tenderness: There is no abdominal tenderness. There is no guarding or rebound.  Musculoskeletal:        General: No tenderness. Normal range of motion.     Cervical back: Normal range of motion and neck supple.  Skin:    General: Skin is warm.  Neurological:     Mental Status: Tina Duarte is alert and oriented to person, place, and time.  Psychiatric:        Mood and Affect: Affect normal.      LABORATORY DATA:  I have reviewed the data as listed Lab Results  Component Value Date   WBC 9.5 10/07/2019   HGB 15.0 10/07/2019   HCT 46.2 (H) 10/07/2019   MCV 86.4 10/07/2019   PLT 311 10/07/2019   Recent Labs    11/19/18 1509 11/19/18 1509 03/18/19 1521 04/19/19 0049 04/23/19 0935 04/24/19 0942 04/26/19 0326 04/26/19 0326 04/27/19 0352 04/27/19 0352 05/13/19 1518 05/13/19 1518 07/07/19 1518 09/18/19 1124 10/07/19 1405  NA 140   < > 140   < >  --    < > 139   < > 139   < > 137   < > 141 131* 138  K 4.4   < > 3.6   < >  --    < > 3.7   < > 3.8   < > 3.8   < > 3.9 4.0 4.4  CL 103   < > 103   < >  --    < > 98   < > 96*   < > 101   < > 105 99 102  CO2 29   < > 28   < >  --    < > 30   < > 32   < > 25   < > 27 20* 26  GLUCOSE 88   < > 85   < >  --    < > 107*   < > 98   < > 109*   < > 104* 127* 111*  BUN 11   < > 13   < >  --    < > <5*   < > <5*   < > 10   < > 8 19 12   CREATININE 1.08   <  > 0.99   < >  --    < > 0.74   < > 0.92   < > 0.90   < > 0.89 1.06* 1.08*  CALCIUM 9.0   < > 8.8   < >  --    < > 8.7*   < > 8.7*   < > 8.8*   < > 8.5* 8.0* 8.9  GFRNONAA  --   --   --    < >  --    < > >60   < > >60   < > >60   < > >60 51* 50*  GFRAA  --   --   --    < >  --    < > >60   < > >60   < > >60   < > >60 59* 58*  PROT 7.0   < > 6.5   < > 5.5*   < > 6.4*  --  6.2*  --  7.5  --   --   --   --   ALBUMIN 4.4   < >  4.1   < > 2.7*   < > 3.1*  --  3.2*  --  4.0  --   --   --   --   AST 29   < > 24   < > 19   < > 18  --  19  --  31  --   --   --   --   ALT 20   < > 15   < > 12   < > 13  --  10  --  14  --   --   --   --   ALKPHOS 63   < > 58   < > 44   < > 54  --  53  --  64  --   --   --   --   BILITOT 0.8   < > 0.9   < > 2.0*   < > 1.7*  --  1.6*  --  1.4*  --   --   --   --   BILIDIR 0.1  --  0.2  --  0.3*  --   --   --   --   --   --   --   --   --   --   IBILI  --   --   --   --  1.7*  --   --   --   --   --   --   --   --   --   --    < > = values in this interval not displayed.    RADIOGRAPHIC STUDIES: I have personally reviewed the radiological images as listed and agreed with the findings in the report. No results found.  ASSESSMENT & PLAN:   Primary hypercoagulable state ([place]) # Factor V Leiden heterozygous; history of multiple DVTs-currently restarted back on Eliquis 2.5 mg twice a day; tolerating well.  Hemoglobin-hemoglobin stable at 13.  #Retroperitoneal hematoma 9 cm in size [risk factors-supratherapeutic anticoagulation-INR 4.5; fall]; currently back on Eliquis' clinically stable.  Continue Eliquis at 2.5 mg twice daily.  # DISPOSITION: # follow up in 6 month- MD; cbc/bmp-Dr.B.      All questions were answered. The patient knows to call the clinic with any problems, questions or concerns.    Cammie Sickle, MD 10/07/2019 4:07 PM

## 2019-10-08 NOTE — Telephone Encounter (Signed)
Labs were done at [facility] office yesterday.

## 2019-10-11 DIAGNOSIS — J449 Chronic obstructive pulmonary disease, unspecified: Secondary | ICD-10-CM | POA: Diagnosis not present

## 2019-10-19 ENCOUNTER — Other Ambulatory Visit: Payer: Self-pay | Admitting: Internal Medicine

## 2019-10-19 DIAGNOSIS — Z1231 Encounter for screening mammogram for malignant neoplasm of breast: Secondary | ICD-10-CM

## 2019-10-19 NOTE — Progress Notes (Signed)
Order placed for mammogram.

## 2019-10-20 ENCOUNTER — Other Ambulatory Visit: Payer: Self-pay | Admitting: Internal Medicine

## 2019-10-20 ENCOUNTER — Telehealth: Payer: Self-pay | Admitting: Internal Medicine

## 2019-10-20 NOTE — Telephone Encounter (Signed)
I lft msg on pt vm to call ofc to sch mammo.

## 2019-11-10 DIAGNOSIS — J449 Chronic obstructive pulmonary disease, unspecified: Secondary | ICD-10-CM | POA: Diagnosis not present

## 2019-11-13 ENCOUNTER — Ambulatory Visit
Admission: RE | Admit: 2019-11-13 | Discharge: 2019-11-13 | Disposition: A | Discharge status: Home / Self Care | Payer: Medicare Other | Source: Ambulatory Visit | Attending: Internal Medicine | Admitting: Internal Medicine

## 2019-11-13 ENCOUNTER — Other Ambulatory Visit: Payer: Self-pay

## 2019-11-13 DIAGNOSIS — Z1231 Encounter for screening mammogram for malignant neoplasm of breast: Secondary | ICD-10-CM | POA: Insufficient documentation

## 2019-12-11 DIAGNOSIS — J449 Chronic obstructive pulmonary disease, unspecified: Secondary | ICD-10-CM | POA: Diagnosis not present

## 2019-12-23 ENCOUNTER — Other Ambulatory Visit: Payer: Self-pay | Admitting: Internal Medicine

## 2020-01-10 DIAGNOSIS — J449 Chronic obstructive pulmonary disease, unspecified: Secondary | ICD-10-CM | POA: Diagnosis not present

## 2020-01-14 ENCOUNTER — Other Ambulatory Visit: Payer: Self-pay | Admitting: Internal Medicine

## 2020-02-10 DIAGNOSIS — J449 Chronic obstructive pulmonary disease, unspecified: Secondary | ICD-10-CM | POA: Diagnosis not present

## 2020-02-19 ENCOUNTER — Other Ambulatory Visit: Payer: Self-pay | Admitting: Internal Medicine

## 2020-03-09 ENCOUNTER — Other Ambulatory Visit: Payer: Self-pay | Admitting: Internal Medicine

## 2020-03-12 DIAGNOSIS — J449 Chronic obstructive pulmonary disease, unspecified: Secondary | ICD-10-CM | POA: Diagnosis not present

## 2020-04-05 ENCOUNTER — Encounter: Payer: Self-pay | Admitting: Internal Medicine

## 2020-04-05 ENCOUNTER — Inpatient Hospital Stay: Payer: Medicare Other | Attending: Internal Medicine

## 2020-04-05 ENCOUNTER — Inpatient Hospital Stay (HOSPITAL_BASED_OUTPATIENT_CLINIC_OR_DEPARTMENT_OTHER): Payer: Medicare Other | Admitting: Internal Medicine

## 2020-04-05 DIAGNOSIS — D6859 Other primary thrombophilia: Secondary | ICD-10-CM

## 2020-04-05 DIAGNOSIS — K661 Hemoperitoneum: Secondary | ICD-10-CM

## 2020-04-05 DIAGNOSIS — Z7901 Long term (current) use of anticoagulants: Secondary | ICD-10-CM | POA: Diagnosis not present

## 2020-04-05 DIAGNOSIS — Z86718 Personal history of other venous thrombosis and embolism: Secondary | ICD-10-CM | POA: Diagnosis not present

## 2020-04-05 LAB — CBC WITH DIFFERENTIAL/PLATELET
Abs Immature Granulocytes: 0.03 10*3/uL (ref 0.00–0.07)
Basophils Absolute: 0.1 10*3/uL (ref 0.0–0.1)
Basophils Relative: 1 %
Eosinophils Absolute: 0.4 10*3/uL (ref 0.0–0.5)
Eosinophils Relative: 5 %
HCT: 43.8 % (ref 36.0–46.0)
Hemoglobin: 14.3 g/dL (ref 12.0–15.0)
Immature Granulocytes: 0 %
Lymphocytes Relative: 47 %
Lymphs Abs: 3.8 10*3/uL (ref 0.7–4.0)
MCH: 29.3 pg (ref 26.0–34.0)
MCHC: 32.6 g/dL (ref 30.0–36.0)
MCV: 89.8 fL (ref 80.0–100.0)
Monocytes Absolute: 0.5 10*3/uL (ref 0.1–1.0)
Monocytes Relative: 6 %
Neutro Abs: 3.3 10*3/uL (ref 1.7–7.7)
Neutrophils Relative %: 41 %
Platelets: 257 10*3/uL (ref 150–400)
RBC: 4.88 MIL/uL (ref 3.87–5.11)
RDW: 12.5 % (ref 11.5–15.5)
WBC: 8 10*3/uL (ref 4.0–10.5)
nRBC: 0 % (ref 0.0–0.2)

## 2020-04-05 LAB — BASIC METABOLIC PANEL
Anion gap: 12 (ref 5–15)
BUN: 19 mg/dL (ref 8–23)
CO2: 23 mmol/L (ref 22–32)
Calcium: 8.7 mg/dL — ABNORMAL LOW (ref 8.9–10.3)
Chloride: 104 mmol/L (ref 98–111)
Creatinine, Ser: 1.03 mg/dL — ABNORMAL HIGH (ref 0.44–1.00)
GFR, Estimated: 56 mL/min — ABNORMAL LOW (ref >60)
Glucose, Bld: 90 mg/dL (ref 70–99)
Potassium: 4.3 mmol/L (ref 3.5–5.1)
Sodium: 139 mmol/L (ref 135–145)

## 2020-04-05 NOTE — Assessment & Plan Note (Addendum)
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  Hemoglobin-hemoglobin stable at 14- STABLE. Patient will need indefinite anticoagulation unless patient has any repeated catastrophic bleeding problems [see below]  #Hx of Retroperitoneal hematoma 9 cm in size [risk factors-supratherapeutic anticoagulation-INR 4.5; fall]; currently back on Eliquis' clinically- STABLE.   Continue Eliquis at 2.5 mg twice daily.   #Defer to PCP for further follow-ups /re: further refills [currently until may 2022]. Patient agreement.  # DISPOSITION: # follow up as needed-Dr.B.

## 2020-04-05 NOTE — Progress Notes (Signed)
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  May 2021-improvement of the hematoma.  # COPD 1 lit/ [place]   Oncology History   No history exists.     HISTORY OF PRESENTING ILLNESS:  Tina Duarte 75 y.o.  female history of factor V heterozygous; and history of recurrent thromboembolic events; s/p retroperitoneal hematoma status post fall March 2021 is here for follow-up.  Patient is currently on Eliquis 2.5 mg twice daily. Denies abdominal pain. Denies any nausea vomiting. No falls.  Review of Systems  Constitutional: Negative for chills, diaphoresis, fever, malaise/fatigue and weight loss.  HENT: Negative for nosebleeds and sore throat.   Eyes: Negative for double vision.  Respiratory: Positive for shortness of breath. Negative for cough, hemoptysis, sputum production and wheezing.   Cardiovascular: Negative for chest pain, palpitations, orthopnea and leg swelling.  Gastrointestinal: Negative for abdominal pain, blood in stool, constipation, diarrhea, heartburn, melena, nausea and vomiting.  Genitourinary: Negative for dysuria, frequency and urgency.  Musculoskeletal: Positive for back pain and joint pain.  Skin: Negative.  Negative for itching and rash.  Neurological: Negative for dizziness, tingling, focal weakness, weakness and headaches.  Endo/Heme/Allergies: Does not bruise/bleed easily.  Psychiatric/Behavioral: Negative for depression. The patient is not nervous/anxious and does not have insomnia.      MEDICAL HISTORY:  Past Medical History:  Diagnosis Date  . Allergy   . Asthma   .  Chicken pox   . COPD (chronic obstructive pulmonary disease) ([place])   . History of blood clots   . Hypercholesterolemia   . Hypertension   . Retroperitoneal hematoma 03/2019    SURGICAL HISTORY: Past Surgical History:  Procedure Laterality Date  . blood clots  2008  . TUBAL LIGATION      SOCIAL HISTORY: Social History   Socioeconomic History  . Marital status: Widowed    Spouse name: Not on file  . Number of children: Not on file  . Years of education: Not on file  . Highest education level: Not on file  Occupational History  . Not on file  Tobacco Use  . Smoking status: Never Smoker  . Smokeless tobacco: Never Used  Vaping Use  . Vaping Use: Never used  Substance and Sexual Activity  . Alcohol use: No    Alcohol/week: 0.0 standard drinks  . Drug use: No  . Sexual activity: Not on file  Other Topics Concern  . Not on file  Social History Narrative   Lives in [place] with son.  never smoked; no alcohol. Used to work in [employer]    Social Determinants of Health   Financial Resource Strain: Not on file  Food Insecurity: Not on file  Transportation Needs: No Transportation Needs  . Lack of Transportation (Medical): No  . Lack of Transportation (Non-Medical): No  Physical Activity: Not on file  Stress: Not on file  Social Connections: Unknown  . Frequency of Communication with Friends and Family: More than three times a week  . Frequency of Social Gatherings with Friends and Family: More than three times a week  . Attends Religious Services: Not on file  . Active Member of Clubs or Organizations: Not  on file  . Attends Archivist Meetings: Not on file  . Marital Status: Not on file  Intimate Partner Violence: Not on file    FAMILY HISTORY: Family History  Problem Relation Age of Onset  . Cancer Mother        Breast  . Heart disease Mother   . Stroke Mother   . Hypertension Mother   . Breast cancer Mother        69's  . Heart disease  Father   . Stroke Father   . Hypertension Father   . Diabetes Father   . Colon cancer Other        paternal cousin    ALLERGIES:  has No Known Allergies.  MEDICATIONS:  Current Outpatient Medications  Medication Sig Dispense Refill  . acetaminophen (TYLENOL) 500 MG tablet Take 1,500 mg by mouth daily as needed for headache (pain).    . citalopram (CELEXA) 20 MG tablet TAKE 1 TABLET (20 MG TOTAL) BY MOUTH DAILY. 90 tablet 1  . ELIQUIS 2.5 MG TABS tablet TAKE 1 TABLET BY MOUTH TWICE A DAY 60 tablet 3  . losartan (COZAAR) 50 MG tablet TAKE 1 TABLET (50 MG TOTAL) BY MOUTH DAILY WITH LUNCH. 90 tablet 1  . rosuvastatin (CRESTOR) 5 MG tablet TAKE 1 TABLET (5 MG TOTAL) BY MOUTH DAILY. 90 tablet 1  . albuterol (ACCUNEB) 0.63 MG/3ML nebulizer solution Take 3 mLs (0.63 mg total) by nebulization every 6 (six) hours as needed for wheezing. (Patient not taking: Reported on 04/05/2020) 75 mL 12  . budesonide (PULMICORT) 0.5 MG/2ML nebulizer solution Take 2 mLs by nebulization 2 (two) times daily as needed (shortness of breath).  (Patient not taking: Reported on 04/05/2020)     No current facility-administered medications for this visit.      Marland Kitchen  PHYSICAL EXAMINATION:  Vitals:   04/05/20 1340  BP: (!) 147/89  Pulse: 93  Resp: 18  Temp: 98.3 F (36.8 C)  SpO2: 97%   Filed Weights   04/05/20 1340  Weight: 215 lb 9.6 oz (97.8 kg)    Physical Exam Constitutional:      Comments: Obese.  She is alone.  Walk with a rolling walker.  HENT:     Head: Normocephalic and atraumatic.     Mouth/Throat:     Pharynx: No oropharyngeal exudate.  Eyes:     Pupils: Pupils are equal, round, and reactive to light.  Cardiovascular:     Rate and Rhythm: Normal rate and regular rhythm.  Pulmonary:     Effort: No respiratory distress.     Breath sounds: No wheezing.  Abdominal:     General: Bowel sounds are normal. There is no distension.     Palpations: Abdomen is soft. There is no mass.     Tenderness:  There is no abdominal tenderness. There is no guarding or rebound.  Musculoskeletal:        General: No tenderness. Normal range of motion.     Cervical back: Normal range of motion and neck supple.  Skin:    General: Skin is warm.  Neurological:     Mental Status: She is alert and oriented to person, place, and time.  Psychiatric:        Mood and Affect: Affect normal.      LABORATORY DATA:  I have reviewed the data as listed Lab Results  Component Value Date   WBC 8.0 04/05/2020   HGB 14.3 04/05/2020   HCT 43.8 04/05/2020  MCV 89.8 04/05/2020   PLT 257 04/05/2020   Recent Labs    04/23/19 0935 04/24/19 0942 04/26/19 0326 04/27/19 0352 05/13/19 1518 07/07/19 1518 09/18/19 1124 10/07/19 1405 04/05/20 1326  NA  --    < > 139 139 137 141 131* 138 139  K  --    < > 3.7 3.8 3.8 3.9 4.0 4.4 4.3  CL  --    < > 98 96* 101 105 99 102 104  CO2  --    < > 30 32 25 27 20* 26 23  GLUCOSE  --    < > 107* 98 109* 104* 127* 111* 90  BUN  --    < > <5* <5* 10 8 19 12 19   CREATININE  --    < > 0.74 0.92 0.90 0.89 1.06* 1.08* 1.03*  CALCIUM  --    < > 8.7* 8.7* 8.8* 8.5* 8.0* 8.9 8.7*  GFRNONAA  --    < > >60 >60 >60 >60 51* 50* 56*  GFRAA  --    < > >60 >60 >60 >60 59* 58*  --   PROT 5.5*   < > 6.4* 6.2* 7.5  --   --   --   --   ALBUMIN 2.7*   < > 3.1* 3.2* 4.0  --   --   --   --   AST 19   < > 18 19 31   --   --   --   --   ALT 12   < > 13 10 14   --   --   --   --   ALKPHOS 44   < > 54 53 64  --   --   --   --   BILITOT 2.0*   < > 1.7* 1.6* 1.4*  --   --   --   --   BILIDIR 0.3*  --   --   --   --   --   --   --   --   IBILI 1.7*  --   --   --   --   --   --   --   --    < > = values in this interval not displayed.    RADIOGRAPHIC STUDIES: I have personally reviewed the radiological images as listed and agreed with the findings in the report. No results found.  ASSESSMENT & PLAN:   Primary hypercoagulable state ([place]) # Factor V Leiden heterozygous; history of multiple  DVTs-currently restarted back on Eliquis 2.5 mg twice a day; tolerating well.  Hemoglobin-hemoglobin stable at 14- STABLE. Patient will need indefinite anticoagulation unless patient has any repeated catastrophic bleeding problems [see below]  #Hx of Retroperitoneal hematoma 9 cm in size [risk factors-supratherapeutic anticoagulation-INR 4.5; fall]; currently back on Eliquis' clinically- STABLE.   Continue Eliquis at 2.5 mg twice daily.   #Defer to PCP for further follow-ups /re: further refills [currently until may 2022]. Patient agreement.  # DISPOSITION: # follow up as needed-Dr.B.      All questions were answered. The patient knows to call the clinic with any problems, questions or concerns.    Cammie Sickle, MD 04/05/2020 2:19 PM

## 2020-04-09 DIAGNOSIS — J449 Chronic obstructive pulmonary disease, unspecified: Secondary | ICD-10-CM | POA: Diagnosis not present

## 2020-05-10 DIAGNOSIS — J449 Chronic obstructive pulmonary disease, unspecified: Secondary | ICD-10-CM | POA: Diagnosis not present

## 2020-05-27 ENCOUNTER — Ambulatory Visit (INDEPENDENT_AMBULATORY_CARE_PROVIDER_SITE_OTHER): Payer: Medicare Other | Admitting: Internal Medicine

## 2020-05-27 ENCOUNTER — Encounter: Payer: Self-pay | Admitting: Internal Medicine

## 2020-05-27 ENCOUNTER — Other Ambulatory Visit: Payer: Self-pay

## 2020-05-27 VITALS — BP 138/70 | HR 74 | Temp 96.2°F | Resp 16 | Ht 64.0 in | Wt 219.8 lb

## 2020-05-27 DIAGNOSIS — J441 Chronic obstructive pulmonary disease with (acute) exacerbation: Secondary | ICD-10-CM

## 2020-05-27 DIAGNOSIS — Z86718 Personal history of other venous thrombosis and embolism: Secondary | ICD-10-CM

## 2020-05-27 DIAGNOSIS — J9611 Chronic respiratory failure with hypoxia: Secondary | ICD-10-CM | POA: Diagnosis not present

## 2020-05-27 DIAGNOSIS — J4520 Mild intermittent asthma, uncomplicated: Secondary | ICD-10-CM | POA: Diagnosis not present

## 2020-05-27 DIAGNOSIS — R739 Hyperglycemia, unspecified: Secondary | ICD-10-CM | POA: Diagnosis not present

## 2020-05-27 DIAGNOSIS — E7800 Pure hypercholesterolemia, unspecified: Secondary | ICD-10-CM

## 2020-05-27 DIAGNOSIS — I10 Essential (primary) hypertension: Secondary | ICD-10-CM

## 2020-05-27 LAB — TSH: TSH: 5.74 u[IU]/mL — ABNORMAL HIGH (ref 0.35–4.50)

## 2020-05-27 LAB — LIPID PANEL
Cholesterol: 163 mg/dL (ref 0–200)
HDL: 49.7 mg/dL (ref >39.00)
LDL Cholesterol: 82 mg/dL (ref 0–99)
NonHDL: 113.01
Total CHOL/HDL Ratio: 3
Triglycerides: 156 mg/dL — ABNORMAL HIGH (ref 0.0–149.0)
VLDL: 31.2 mg/dL (ref 0.0–40.0)

## 2020-05-27 LAB — BASIC METABOLIC PANEL
BUN: 14 mg/dL (ref 6–23)
CO2: 31 mEq/L (ref 19–32)
Calcium: 8.9 mg/dL (ref 8.4–10.5)
Chloride: 103 mEq/L (ref 96–112)
Creatinine, Ser: 1.04 mg/dL (ref 0.40–1.20)
GFR: 52.16 mL/min — ABNORMAL LOW (ref >60.00)
Glucose, Bld: 88 mg/dL (ref 70–99)
Potassium: 4.7 mEq/L (ref 3.5–5.1)
Sodium: 139 mEq/L (ref 135–145)

## 2020-05-27 LAB — HEPATIC FUNCTION PANEL
ALT: 11 U/L (ref 0–35)
AST: 16 U/L (ref 0–37)
Albumin: 4.2 g/dL (ref 3.5–5.2)
Alkaline Phosphatase: 54 U/L (ref 39–117)
Bilirubin, Direct: 0.1 mg/dL (ref 0.0–0.3)
Total Bilirubin: 0.9 mg/dL (ref 0.2–1.2)
Total Protein: 6.8 g/dL (ref 6.0–8.3)

## 2020-05-27 LAB — HEMOGLOBIN A1C: Hgb A1c MFr Bld: 5.8 % (ref 4.6–6.5)

## 2020-05-27 MED ORDER — ALBUTEROL SULFATE 0.63 MG/3ML IN NEBU: 1.0000 | INHALATION_SOLUTION | Freq: Four times a day (QID) | RESPIRATORY_TRACT | 3 refills | Status: DC | PRN

## 2020-05-27 NOTE — Progress Notes (Signed)
[name], female   DOB: Jun 27, 1945, 75 y.o.   MRN: [id number]   Subjective:    [name], female    DOB: August 09, 1945, 75 y.o.   MRN: [id number]  HPI This visit occurred during the SARS-CoV-2 public health emergency.  Safety protocols were in place, including screening questions prior to the visit, additional usage of staff PPE, and extensive cleaning of exam room while observing appropriate contact time as indicated for disinfecting solutions.  Patient here for a scheduled follow up. 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  On eliquis.  Also has a history of COPD, hypercholesterolemia and hypertension.  States her breathing is stable.  No increased cough or congestion.  No chest pain.  No acid reflux reported.  Bowels moving.  No abdominal pain.  Overall feels things are stable.    Past Medical History:  Diagnosis Date  . Allergy   . Asthma   . Chicken pox   . COPD (chronic obstructive pulmonary disease) ([place])   . History of blood clots   . Hypercholesterolemia   . Hypertension   . Retroperitoneal hematoma 03/2019   Past Surgical History:  Procedure Laterality Date  . blood clots  2008  . TUBAL LIGATION     Family History  Problem Relation Age of Onset  . Cancer Mother        Breast  . Heart disease Mother   . Stroke Mother   . Hypertension Mother   . Breast cancer Mother        55's  . Heart disease Father   . Stroke Father   . Hypertension Father   . Diabetes Father   . Colon cancer Other        paternal cousin   Social History   Socioeconomic History  . Marital status: Widowed    Spouse name: Not on file  . Number of children: Not on file  . Years of education: Not on file  . Highest education level: Not on file  Occupational History  . Not on file  Tobacco Use  . Smoking status: Never Smoker  . Smokeless tobacco: Never Used  Vaping Use  . Vaping Use: Never used  Substance and Sexual  Activity  . Alcohol use: No    Alcohol/week: 0.0 standard drinks  . Drug use: No  . Sexual activity: Not on file  Other Topics Concern  . Not on file  Social History Narrative   Lives in [place] with son.  never smoked; no alcohol. Used to work in [employer]    Social Determinants of Health   Financial Resource Strain: Not on file  Food Insecurity: Not on file  Transportation Needs: No Transportation Needs  . Lack of Transportation (Medical): No  . Lack of Transportation (Non-Medical): No  Physical Activity: Not on file  Stress: Not on file  Social Connections: Unknown  . Frequency of Communication with Friends and Family: More than three times a week  . Frequency of Social Gatherings with Friends and Family: More than three times a week  . Attends Religious Services: Not on file  . Active Member of Clubs or Organizations: Not on file  . Attends Archivist Meetings: Not on file  . Marital Status: Not on file    Outpatient Encounter Medications as of 05/27/2020  Medication Sig  . acetaminophen (TYLENOL) 500 MG tablet Take 1,500 mg by mouth daily as needed for headache (pain).  Marland Kitchen  budesonide (PULMICORT) 0.5 MG/2ML nebulizer solution Take 2 mLs by nebulization 2 (two) times daily as needed (shortness of breath).  . citalopram (CELEXA) 20 MG tablet TAKE 1 TABLET (20 MG TOTAL) BY MOUTH DAILY.  Marland Kitchen ELIQUIS 2.5 MG TABS tablet TAKE 1 TABLET BY MOUTH TWICE A DAY  . losartan (COZAAR) 50 MG tablet TAKE 1 TABLET (50 MG TOTAL) BY MOUTH DAILY WITH LUNCH.  Marland Kitchen Potassium 95 MG TABS Take 1 tablet by mouth daily.  . rosuvastatin (CRESTOR) 5 MG tablet TAKE 1 TABLET (5 MG TOTAL) BY MOUTH DAILY.  . [DISCONTINUED] albuterol (ACCUNEB) 0.63 MG/3ML nebulizer solution Take 3 mLs (0.63 mg total) by nebulization every 6 (six) hours as needed for wheezing.  Marland Kitchen albuterol (ACCUNEB) 0.63 MG/3ML nebulizer solution Take 3 mLs (0.63 mg total) by nebulization every 6 (six) hours as needed for wheezing.    No facility-administered encounter medications on file as of 05/27/2020.    Review of Systems  Constitutional: Negative for appetite change and unexpected weight change.  HENT: Negative for congestion and sinus pressure.   Respiratory: Negative for cough and chest tightness.        Breathing stable.   Cardiovascular: Negative for chest pain, palpitations and leg swelling.  Gastrointestinal: Negative for abdominal pain, diarrhea, nausea and vomiting.  Genitourinary: Negative for difficulty urinating and dysuria.  Musculoskeletal: Negative for joint swelling and myalgias.  Skin: Negative for color change and rash.  Neurological: Negative for dizziness, light-headedness and headaches.  Psychiatric/Behavioral: Negative for agitation and dysphoric mood.       Objective:    Physical Exam Vitals reviewed.  Constitutional:      General: She is not in acute distress.    Appearance: Normal appearance.  HENT:     Head: Normocephalic and atraumatic.     Right Ear: External ear normal.     Left Ear: External ear normal.  Eyes:     General: No scleral icterus.       Right eye: No discharge.        Left eye: No discharge.     Conjunctiva/sclera: Conjunctivae normal.  Neck:     Thyroid: No thyromegaly.  Cardiovascular:     Rate and Rhythm: Normal rate and regular rhythm.  Pulmonary:     Effort: No respiratory distress.     Breath sounds: Normal breath sounds. No wheezing.  Abdominal:     General: Bowel sounds are normal.     Palpations: Abdomen is soft.     Tenderness: There is no abdominal tenderness.  Musculoskeletal:        General: No swelling or tenderness.     Cervical back: Neck supple. No tenderness.  Lymphadenopathy:     Cervical: No cervical adenopathy.  Skin:    Findings: No erythema or rash.  Neurological:     Mental Status: She is alert.  Psychiatric:        Mood and Affect: Mood normal.        Behavior: Behavior normal.     BP 138/70 (BP Location: Left  Arm, Patient Position: Sitting, Cuff Size: Large)   Pulse 74   Temp (!) 96.2 F (35.7 C) (Temporal)   Resp 16   Ht '5\' 4"'  (1.626 m)   Wt 219 lb 12.8 oz (99.7 kg)   LMP 04/29/1997   SpO2 97%   BMI 37.73 kg/m  Wt Readings from Last 3 Encounters:  05/27/20 219 lb 12.8 oz (99.7 kg)  04/05/20 215 lb 9.6 oz (97.8 kg)  10/07/19 218 lb (98.9 kg)     Lab Results  Component Value Date   WBC 8.0 04/05/2020   HGB 14.3 04/05/2020   HCT 43.8 04/05/2020   PLT 257 04/05/2020   GLUCOSE 88 05/27/2020   CHOL 163 05/27/2020   TRIG 156.0 (H) 05/27/2020   HDL 49.70 05/27/2020   LDLDIRECT 174.0 11/13/2017   LDLCALC 82 05/27/2020   ALT 11 05/27/2020   AST 16 05/27/2020   NA 139 05/27/2020   K 4.7 05/27/2020   CL 103 05/27/2020   CREATININE 1.04 05/27/2020   BUN 14 05/27/2020   CO2 31 05/27/2020   TSH 5.74 (H) 05/27/2020   INR 1.2 04/23/2019   HGBA1C 5.8 05/27/2020    MM 3D SCREEN BREAST BILATERAL  Result Date: 11/17/2019 CLINICAL DATA:  Screening. EXAM: DIGITAL SCREENING BILATERAL MAMMOGRAM WITH TOMO AND CAD COMPARISON:  Previous exam(s). ACR Breast Density Category b: There are scattered areas of fibroglandular density. FINDINGS: There are no findings suspicious for malignancy. Images were processed with CAD. IMPRESSION: No mammographic evidence of malignancy. A result letter of this screening mammogram will be mailed directly to the patient. RECOMMENDATION: Screening mammogram in one year. (Code:SM-B-01Y) BI-RADS CATEGORY  1: Negative. Electronically Signed   By: Kristopher Oppenheim M.D.   On: 11/17/2019 13:32       Assessment & Plan:   Problem List Items Addressed This Visit    Asthma    No increased cough or congestion.  Using oxygen.  Continue pulmicort. Has rescue inhaler if needed.  Follow.       Relevant Medications   albuterol (ACCUNEB) 0.63 MG/3ML nebulizer solution   Essential hypertension, benign    Continue losartan.  Blood pressure doing well.  Follow pressures.  Follow  metabolic panel.       Relevant Orders   TSH (Completed)   Basic metabolic panel (Completed)   History of blood clots    Continue eliquis.  Followed by hematology.       Hypercholesterolemia - Primary    Continue crestor.  Low cholesterol diet and exercise.  Follow lipid panel and liver function tests.        Relevant Orders   Hepatic function panel (Completed)   Lipid panel (Completed)   Hyperglycemia    Low carb diet and exercise.  Follow met b and a1c.       Relevant Orders   Hemoglobin A1c (Completed)   Respiratory failure with hypoxia ([facility])    Has oxygen.  Needs a more portable tank.  Contact service to see if they can supply portable oxygen.         Other Visit Diagnoses    COPD with acute exacerbation ([place])       Relevant Medications   albuterol (ACCUNEB) 0.63 MG/3ML nebulizer solution       Einar Pheasant, MD

## 2020-05-28 ENCOUNTER — Other Ambulatory Visit: Payer: Self-pay | Admitting: Internal Medicine

## 2020-05-28 DIAGNOSIS — R7989 Other specified abnormal findings of blood chemistry: Secondary | ICD-10-CM

## 2020-05-28 NOTE — Progress Notes (Signed)
Order placed for f/u tsh check.

## 2020-06-02 ENCOUNTER — Other Ambulatory Visit: Payer: Self-pay | Admitting: Internal Medicine

## 2020-06-02 ENCOUNTER — Telehealth: Payer: Self-pay

## 2020-06-02 DIAGNOSIS — R7989 Other specified abnormal findings of blood chemistry: Secondary | ICD-10-CM

## 2020-06-02 NOTE — Telephone Encounter (Signed)
-----   Message from Einar Pheasant, MD sent at 05/28/2020  4:04 AM EDT ----- Notify - cholesterol levels are relatively stable.  Continue crestor and low cholesterol diet.  Kidney function is stale. TSH is slightly elevated.  When just slightly elevated, just need to follow.  Will need to recheck tsh in 6 weeks.  Liver function tests wnl.  Overall sugar control improved.

## 2020-06-02 NOTE — Progress Notes (Signed)
Order placed for f/u lab.   

## 2020-06-05 ENCOUNTER — Encounter: Payer: Self-pay | Admitting: Internal Medicine

## 2020-06-05 NOTE — Assessment & Plan Note (Signed)
Continue losartan.  Blood pressure doing well.  Follow pressures.  Follow metabolic panel.  

## 2020-06-05 NOTE — Assessment & Plan Note (Signed)
Has oxygen.  Needs a more portable tank.  Contact service to see if they can supply portable oxygen.

## 2020-06-05 NOTE — Assessment & Plan Note (Signed)
Continue eliquis.  Followed by hematology.  

## 2020-06-05 NOTE — Assessment & Plan Note (Signed)
No increased cough or congestion.  Using oxygen.  Continue pulmicort. Has rescue inhaler if needed.  Follow.

## 2020-06-05 NOTE — Assessment & Plan Note (Signed)
Low carb diet and exercise.  Follow met b and a1c.  

## 2020-06-05 NOTE — Assessment & Plan Note (Signed)
Continue crestor.  Low cholesterol diet and exercise. Follow lipid panel and liver function tests.   

## 2020-06-09 DIAGNOSIS — J449 Chronic obstructive pulmonary disease, unspecified: Secondary | ICD-10-CM | POA: Diagnosis not present

## 2020-06-17 ENCOUNTER — Other Ambulatory Visit: Payer: Self-pay | Admitting: Internal Medicine

## 2020-06-21 ENCOUNTER — Other Ambulatory Visit: Payer: Self-pay | Admitting: Internal Medicine

## 2020-07-10 DIAGNOSIS — J449 Chronic obstructive pulmonary disease, unspecified: Secondary | ICD-10-CM | POA: Diagnosis not present

## 2020-07-15 ENCOUNTER — Other Ambulatory Visit (INDEPENDENT_AMBULATORY_CARE_PROVIDER_SITE_OTHER): Payer: Medicare Other

## 2020-07-15 ENCOUNTER — Other Ambulatory Visit: Payer: Self-pay

## 2020-07-15 DIAGNOSIS — R7989 Other specified abnormal findings of blood chemistry: Secondary | ICD-10-CM

## 2020-07-15 LAB — TSH: TSH: 5.54 u[IU]/mL — ABNORMAL HIGH (ref 0.35–4.50)

## 2020-07-15 LAB — T4, FREE: Free T4: 0.72 ng/dL (ref 0.60–1.60)

## 2020-07-16 ENCOUNTER — Telehealth: Payer: Self-pay

## 2020-07-16 NOTE — Telephone Encounter (Signed)
LMTCB for labs. 

## 2020-07-20 ENCOUNTER — Other Ambulatory Visit: Payer: Self-pay | Admitting: Internal Medicine

## 2020-08-09 DIAGNOSIS — J449 Chronic obstructive pulmonary disease, unspecified: Secondary | ICD-10-CM | POA: Diagnosis not present

## 2020-08-20 ENCOUNTER — Telehealth: Payer: Self-pay

## 2020-08-20 ENCOUNTER — Ambulatory Visit: Payer: Medicare Other

## 2020-08-20 NOTE — Telephone Encounter (Signed)
Opened in error

## 2020-08-20 NOTE — Telephone Encounter (Signed)
No answer when called for scheduled awv. Unable to leave message, mailbox full. No show. Reschedule as appropriate.

## 2020-08-30 ENCOUNTER — Other Ambulatory Visit: Payer: Self-pay | Admitting: Internal Medicine

## 2020-09-09 DIAGNOSIS — J449 Chronic obstructive pulmonary disease, unspecified: Secondary | ICD-10-CM | POA: Diagnosis not present

## 2020-09-28 ENCOUNTER — Other Ambulatory Visit: Payer: Self-pay

## 2020-09-28 ENCOUNTER — Ambulatory Visit (INDEPENDENT_AMBULATORY_CARE_PROVIDER_SITE_OTHER): Payer: Medicare Other | Admitting: Internal Medicine

## 2020-09-28 ENCOUNTER — Ambulatory Visit (INDEPENDENT_AMBULATORY_CARE_PROVIDER_SITE_OTHER): Payer: Medicare Other

## 2020-09-28 ENCOUNTER — Encounter: Payer: Self-pay | Admitting: Internal Medicine

## 2020-09-28 VITALS — BP 128/80 | HR 78 | Temp 97.8°F | Resp 16 | Ht 64.0 in | Wt 226.0 lb

## 2020-09-28 DIAGNOSIS — E7800 Pure hypercholesterolemia, unspecified: Secondary | ICD-10-CM

## 2020-09-28 DIAGNOSIS — Z1211 Encounter for screening for malignant neoplasm of colon: Secondary | ICD-10-CM | POA: Diagnosis not present

## 2020-09-28 DIAGNOSIS — J4520 Mild intermittent asthma, uncomplicated: Secondary | ICD-10-CM | POA: Diagnosis not present

## 2020-09-28 DIAGNOSIS — M25512 Pain in left shoulder: Secondary | ICD-10-CM

## 2020-09-28 DIAGNOSIS — D6859 Other primary thrombophilia: Secondary | ICD-10-CM

## 2020-09-28 DIAGNOSIS — I10 Essential (primary) hypertension: Secondary | ICD-10-CM | POA: Diagnosis not present

## 2020-09-28 DIAGNOSIS — R7989 Other specified abnormal findings of blood chemistry: Secondary | ICD-10-CM | POA: Diagnosis not present

## 2020-09-28 DIAGNOSIS — D6851 Activated protein C resistance: Secondary | ICD-10-CM

## 2020-09-28 DIAGNOSIS — Z1231 Encounter for screening mammogram for malignant neoplasm of breast: Secondary | ICD-10-CM | POA: Diagnosis not present

## 2020-09-28 DIAGNOSIS — Z0000 Encounter for general adult medical examination without abnormal findings: Secondary | ICD-10-CM | POA: Diagnosis not present

## 2020-09-28 DIAGNOSIS — F439 Reaction to severe stress, unspecified: Secondary | ICD-10-CM

## 2020-09-28 DIAGNOSIS — M25712 Osteophyte, left shoulder: Secondary | ICD-10-CM | POA: Diagnosis not present

## 2020-09-28 DIAGNOSIS — R739 Hyperglycemia, unspecified: Secondary | ICD-10-CM

## 2020-09-28 DIAGNOSIS — M19012 Primary osteoarthritis, left shoulder: Secondary | ICD-10-CM | POA: Diagnosis not present

## 2020-09-28 LAB — BASIC METABOLIC PANEL
BUN: 10 mg/dL (ref 6–23)
CO2: 29 mEq/L (ref 19–32)
Calcium: 9.1 mg/dL (ref 8.4–10.5)
Chloride: 103 mEq/L (ref 96–112)
Creatinine, Ser: 0.95 mg/dL (ref 0.40–1.20)
GFR: 58.01 mL/min — ABNORMAL LOW (ref >60.00)
Glucose, Bld: 77 mg/dL (ref 70–99)
Potassium: 4.4 mEq/L (ref 3.5–5.1)
Sodium: 139 mEq/L (ref 135–145)

## 2020-09-28 LAB — LIPID PANEL
Cholesterol: 173 mg/dL (ref 0–200)
HDL: 46.7 mg/dL (ref >39.00)
NonHDL: 126.4
Total CHOL/HDL Ratio: 4
Triglycerides: 234 mg/dL — ABNORMAL HIGH (ref 0.0–149.0)
VLDL: 46.8 mg/dL — ABNORMAL HIGH (ref 0.0–40.0)

## 2020-09-28 LAB — HEPATIC FUNCTION PANEL
ALT: 9 U/L (ref 0–35)
AST: 13 U/L (ref 0–37)
Albumin: 4 g/dL (ref 3.5–5.2)
Alkaline Phosphatase: 53 U/L (ref 39–117)
Bilirubin, Direct: 0.1 mg/dL (ref 0.0–0.3)
Total Bilirubin: 0.7 mg/dL (ref 0.2–1.2)
Total Protein: 6.8 g/dL (ref 6.0–8.3)

## 2020-09-28 LAB — HEMOGLOBIN A1C: Hgb A1c MFr Bld: 5.9 % (ref 4.6–6.5)

## 2020-09-28 LAB — TSH: TSH: 2.54 u[IU]/mL (ref 0.35–5.50)

## 2020-09-28 LAB — LDL CHOLESTEROL, DIRECT: Direct LDL: 89 mg/dL

## 2020-09-28 LAB — T4, FREE: Free T4: 0.7 ng/dL (ref 0.60–1.60)

## 2020-09-28 NOTE — Progress Notes (Signed)
Patient ID: [name], female   DOB: Mar 13, 1945, 75 y.o.   MRN: [id number]   Subjective:    [name], female    DOB: 1946-01-13, 75 y.o.   MRN: [id number]  This visit occurred during the SARS-CoV-2 public health emergency.  Safety protocols were in place, including screening questions prior to the visit, additional usage of staff PPE, and extensive cleaning of exam room while observing appropriate contact time as indicated for disinfecting solutions.   Patient here for physical exam.   Chief Complaint  Patient presents with   Annual Exam   .   HPI Reports she is doing relatively well.  Breathing is stable.  No increased cough  or congestion reported.  No chest pain.  Had her grandchildren over the summer.  They have started back in school now.  Decreased stress.  Has had problems with her left shoulder for 25 plus years.  Increased pain recently.  Limited rom.  Unable to lift arm.  Has a history of back pain - chronic.  Taking tylenol.  Knee pain - has seen Dr Sabra Heck previously.  Request referral back.  No acid reflux reported.  No abdominal pain.  Bowels moving.     Past Medical History:  Diagnosis Date   Allergy    Asthma    Chicken pox    COPD (chronic obstructive pulmonary disease) ([place])    History of blood clots    Hypercholesterolemia    Hypertension    Retroperitoneal hematoma 03/2019   Past Surgical History:  Procedure Laterality Date   blood clots  2008   TUBAL LIGATION     Family History  Problem Relation Age of Onset   Cancer Mother        Breast   Heart disease Mother    Stroke Mother    Hypertension Mother    Breast cancer Mother        24's   Heart disease Father    Stroke Father    Hypertension Father    Diabetes Father    Colon cancer Other        paternal cousin   Social History   Socioeconomic History   Marital status: Widowed    Spouse name: Not on file   Number of children: Not on file   Years of education: Not on file    Highest education level: Not on file  Occupational History   Not on file  Tobacco Use   Smoking status: Never   Smokeless tobacco: Never  Vaping Use   Vaping Use: Never used  Substance and Sexual Activity   Alcohol use: No    Alcohol/week: 0.0 standard drinks   Drug use: No   Sexual activity: Not on file  Other Topics Concern   Not on file  Social History Narrative   Lives in [place] with son.  never smoked; no alcohol. Used to work in [employer]    Social Determinants of Health   Financial Resource Strain: Not on file  Food Insecurity: Not on file  Transportation Needs: Not on file  Physical Activity: Not on file  Stress: Not on file  Social Connections: Not on file    Review of Systems  Constitutional:  Negative for appetite change and unexpected weight change.  HENT:  Negative for congestion, sinus pressure and sore throat.   Eyes:  Negative for pain and visual disturbance.  Respiratory:  Negative for cough and chest tightness.  No increased sob.   Cardiovascular:  Negative for chest pain and palpitations.       No increased swelling.   Gastrointestinal:  Negative for abdominal pain, diarrhea, nausea and vomiting.  Genitourinary:  Negative for difficulty urinating and dysuria.  Musculoskeletal:  Negative for joint swelling and myalgias.       Shoulder, knee and back pain as outlined.   Skin:  Negative for color change and rash.  Neurological:  Negative for dizziness, light-headedness and headaches.  Hematological:  Negative for adenopathy. Does not bruise/bleed easily.  Psychiatric/Behavioral:  Negative for agitation and dysphoric mood.       Objective:     BP 128/80   Pulse 78   Temp 97.8 F (36.6 C)   Resp 16   Ht '5\' 4"'  (1.626 m)   Wt 226 lb (102.5 kg)   LMP 04/29/1997   SpO2 98%   BMI 38.79 kg/m  Wt Readings from Last 3 Encounters:  09/28/20 226 lb (102.5 kg)  05/27/20 219 lb 12.8 oz (99.7 kg)  04/05/20 215 lb 9.6 oz (97.8 kg)     Physical Exam Vitals reviewed.  Constitutional:      General: She is not in acute distress.    Appearance: Normal appearance. She is well-developed.  HENT:     Head: Normocephalic and atraumatic.     Right Ear: External ear normal.     Left Ear: External ear normal.  Eyes:     General: No scleral icterus.       Right eye: No discharge.        Left eye: No discharge.     Conjunctiva/sclera: Conjunctivae normal.  Neck:     Thyroid: No thyromegaly.  Cardiovascular:     Rate and Rhythm: Normal rate and regular rhythm.  Pulmonary:     Effort: No tachypnea, accessory muscle usage or respiratory distress.     Breath sounds: Normal breath sounds. No decreased breath sounds or wheezing.  Chest:  Breasts:    Right: No inverted nipple, mass, nipple discharge or tenderness (no axillary adenopathy).     Left: No inverted nipple, mass, nipple discharge or tenderness (no axilarry adenopathy).  Abdominal:     General: Bowel sounds are normal.     Palpations: Abdomen is soft.     Tenderness: There is no abdominal tenderness.  Musculoskeletal:        General: No swelling or tenderness.     Cervical back: Neck supple.  Lymphadenopathy:     Cervical: No cervical adenopathy.  Skin:    Findings: No erythema or rash.  Neurological:     Mental Status: She is alert and oriented to person, place, and time.  Psychiatric:        Mood and Affect: Mood normal.        Behavior: Behavior normal.     Outpatient Encounter Medications as of 09/28/2020  Medication Sig   acetaminophen (TYLENOL) 500 MG tablet Take 1,500 mg by mouth daily as needed for headache (pain).   albuterol (ACCUNEB) 0.63 MG/3ML nebulizer solution Take 3 mLs (0.63 mg total) by nebulization every 6 (six) hours as needed for wheezing.   budesonide (PULMICORT) 0.5 MG/2ML nebulizer solution Take 2 mLs by nebulization 2 (two) times daily as needed (shortness of breath).   citalopram (CELEXA) 20 MG tablet TAKE 1 TABLET (20 MG TOTAL)  BY MOUTH DAILY.   ELIQUIS 2.5 MG TABS tablet TAKE 1 TABLET BY MOUTH TWICE A DAY   losartan (COZAAR) 50 MG  tablet TAKE 1 TABLET (50 MG TOTAL) BY MOUTH DAILY WITH LUNCH.   Potassium 95 MG TABS Take 1 tablet by mouth daily.   rosuvastatin (CRESTOR) 5 MG tablet TAKE 1 TABLET (5 MG TOTAL) BY MOUTH DAILY.   No facility-administered encounter medications on file as of 09/28/2020.     Lab Results  Component Value Date   WBC 8.0 04/05/2020   HGB 14.3 04/05/2020   HCT 43.8 04/05/2020   PLT 257 04/05/2020   GLUCOSE 77 09/28/2020   CHOL 173 09/28/2020   TRIG 234.0 (H) 09/28/2020   HDL 46.70 09/28/2020   LDLDIRECT 89.0 09/28/2020   LDLCALC 82 05/27/2020   ALT 9 09/28/2020   AST 13 09/28/2020   NA 139 09/28/2020   K 4.4 09/28/2020   CL 103 09/28/2020   CREATININE 0.95 09/28/2020   BUN 10 09/28/2020   CO2 29 09/28/2020   TSH 2.54 09/28/2020   INR 1.2 04/23/2019   HGBA1C 5.9 09/28/2020    MM 3D SCREEN BREAST BILATERAL  Result Date: 11/17/2019 CLINICAL DATA:  Screening. EXAM: DIGITAL SCREENING BILATERAL MAMMOGRAM WITH TOMO AND CAD COMPARISON:  Previous exam(s). ACR Breast Density Category b: There are scattered areas of fibroglandular density. FINDINGS: There are no findings suspicious for malignancy. Images were processed with CAD. IMPRESSION: No mammographic evidence of malignancy. A result letter of this screening mammogram will be mailed directly to the patient. RECOMMENDATION: Screening mammogram in one year. (Code:SM-B-01Y) BI-RADS CATEGORY  1: Negative. Electronically Signed   By: Kristopher Oppenheim M.D.   On: 11/17/2019 13:32       Assessment & Plan:   Problem List Items Addressed This Visit     Asthma    No increased cough or congestion.  Breathing stable.        Essential hypertension, benign    Continue losartan.  Blood pressure doing well.  Follow pressures.  Follow metabolic panel.       Factor V Leiden ([place])    Continue eliquis as outlined.        Health care  maintenance    Physical today 09/28/2020.  Mammogram 11/17/19 - Birads I.   Colonoscopy 05/2010.  Had recommended follow-up in 10 years. She is agreeable.  Refer to GI for evaluation and question of need for f/u colonoscopy.        Hypercholesterolemia    Continue crestor.  Low cholesterol diet and exercise.  Follow lipid panel and liver function tests.        Relevant Orders   Basic metabolic panel (Completed)   Hepatic function panel (Completed)   Lipid panel (Completed)   Hyperglycemia    Low carb diet and exercise.  Follow met b and a1c.       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  Will need indefinite anticoagulation.        Shoulder pain, left    Persistent/increased shoulder pain.  Limited rom.  Check xray.  Discussed ortho referral.       Relevant Orders   DG Shoulder Left (Completed)   Stress    Continues on citalopram.  Overall appears to be handling things well.  Follow.       Other Visit Diagnoses     Routine general medical examination at a health care facility    -  Primary   Visit for screening mammogram  Relevant Orders   MM 3D SCREEN BREAST BILATERAL   Elevated TSH       Relevant Orders   TSH (Completed)   T4, free (Completed)   Colon cancer screening       Relevant Orders   Ambulatory referral to Gastroenterology        Einar Pheasant, MD

## 2020-09-28 NOTE — Assessment & Plan Note (Addendum)
Physical today 09/28/2020.  Mammogram 11/17/19 - Birads I.   Colonoscopy 05/2010.  Had recommended follow-up in 10 years. She is agreeable.  Refer to GI for evaluation and question of need for f/u colonoscopy.

## 2020-10-02 ENCOUNTER — Other Ambulatory Visit: Payer: Self-pay | Admitting: Internal Medicine

## 2020-10-02 DIAGNOSIS — M25512 Pain in left shoulder: Secondary | ICD-10-CM

## 2020-10-02 NOTE — Progress Notes (Signed)
Order placed for ortho referral.   

## 2020-10-03 ENCOUNTER — Encounter: Payer: Self-pay | Admitting: Internal Medicine

## 2020-10-03 NOTE — Assessment & Plan Note (Signed)
Continue losartan.  Blood pressure doing well.  Follow pressures.  Follow metabolic panel.  

## 2020-10-03 NOTE — Assessment & Plan Note (Signed)
Persistent/increased shoulder pain.  Limited rom.  Check xray.  Discussed ortho referral.

## 2020-10-03 NOTE — Assessment & Plan Note (Signed)
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  Will need indefinite anticoagulation.

## 2020-10-03 NOTE — Assessment & Plan Note (Signed)
Continue crestor.  Low cholesterol diet and exercise. Follow lipid panel and liver function tests.   

## 2020-10-03 NOTE — Assessment & Plan Note (Signed)
Continue eliquis as outlined.

## 2020-10-03 NOTE — Assessment & Plan Note (Signed)
Continues on citalopram.  Overall appears to be handling things well.  Follow.  

## 2020-10-03 NOTE — Assessment & Plan Note (Signed)
No increased cough or congestion.  Breathing stable.

## 2020-10-03 NOTE — Assessment & Plan Note (Signed)
Low carb diet and exercise.  Follow met b and a1c.  

## 2020-10-10 DIAGNOSIS — J449 Chronic obstructive pulmonary disease, unspecified: Secondary | ICD-10-CM | POA: Diagnosis not present

## 2020-10-14 ENCOUNTER — Other Ambulatory Visit: Payer: Self-pay | Admitting: Internal Medicine

## 2020-10-14 DIAGNOSIS — M170 Bilateral primary osteoarthritis of knee: Secondary | ICD-10-CM | POA: Diagnosis not present

## 2020-10-14 NOTE — Telephone Encounter (Signed)
Pcp should RF. Dr. B last saw patient on 04/05/20. Pt only need to follow-up as needed.  Will cc: notes to Einar Pheasant to see if pcp can RF med.

## 2020-10-15 MED ORDER — APIXABAN 2.5 MG PO TABS: 2.5000 mg | ORAL_TABLET | Freq: Two times a day (BID) | ORAL | 3 refills | Status: DC

## 2020-10-15 NOTE — Telephone Encounter (Signed)
Received notification from oncology that Ms Tina Duarte needed refill on eliquis.  Please notify pt that RX sent in for eliquis.

## 2020-10-15 NOTE — Addendum Note (Signed)
Addended by: Alisa Graff on: 10/15/2020 12:10 AM   Modules accepted: Orders

## 2020-10-21 ENCOUNTER — Ambulatory Visit (INDEPENDENT_AMBULATORY_CARE_PROVIDER_SITE_OTHER): Payer: Medicare Other

## 2020-10-21 VITALS — Ht 64.0 in | Wt 226.0 lb

## 2020-10-21 DIAGNOSIS — Z0000 Encounter for general adult medical examination without abnormal findings: Secondary | ICD-10-CM

## 2020-10-21 NOTE — Patient Instructions (Addendum)
Tina Duarte , Thank you for taking time to come for your Medicare Wellness Visit. I appreciate your ongoing commitment to your health goals. Please review the following plan we discussed and let me know if I can assist you in the future.   These are the goals we discussed:  Goals       Patient Stated     Weight (lb) < 230 lb (104.3 kg) (pt-stated)      Healthy diet        This is a list of the screening recommended for you and due dates:  Health Maintenance  Topic Date Due   Flu Shot  08/30/2020   Zoster (Shingles) Vaccine (1 of 2) 12/29/2020*   Tetanus Vaccine  10/21/2021*   Mammogram  11/12/2020   DEXA scan (bone density measurement)  Completed   Hepatitis C Screening: USPSTF Recommendation to screen - Ages 40-79 yo.  Completed   HPV Vaccine  Aged Out   COVID-19 Vaccine  Discontinued  *Topic was postponed. The date shown is not the original due date.    Advanced directives: not on file  Conditions/risks identified: none new  Follow up in one year for your annual wellness visit    Preventive Care 65 Years and Older, Female Preventive care refers to lifestyle choices and visits with your health care provider that can promote health and wellness. What does preventive care include? A yearly physical exam. This is also called an annual well check. Dental exams once or twice a year. Routine eye exams. Ask your health care provider how often you should have your eyes checked. Personal lifestyle choices, including: Daily care of your teeth and gums. Regular physical activity. Eating a healthy diet. Avoiding tobacco and drug use. Limiting alcohol use. Practicing safe sex. Taking low-dose aspirin every day. Taking vitamin and mineral supplements as recommended by your health care provider. What happens during an annual well check? The services and screenings done by your health care provider during your annual well check will depend on your age, overall health, lifestyle risk  factors, and family history of disease. Counseling  Your health care provider may ask you questions about your: Alcohol use. Tobacco use. Drug use. Emotional well-being. Home and relationship well-being. Sexual activity. Eating habits. History of falls. Memory and ability to understand (cognition). Work and work Statistician. Reproductive health. Screening  You may have the following tests or measurements: Height, weight, and BMI. Blood pressure. Lipid and cholesterol levels. These may be checked every 5 years, or more frequently if you are over 21 years old. Skin check. Lung cancer screening. You may have this screening every year starting at age 92 if you have a 30-pack-year history of smoking and currently smoke or have quit within the past 15 years. Fecal occult blood test (FOBT) of the stool. You may have this test every year starting at age 58. Flexible sigmoidoscopy or colonoscopy. You may have a sigmoidoscopy every 5 years or a colonoscopy every 10 years starting at age 75. Hepatitis C blood test. Hepatitis B blood test. Sexually transmitted disease (STD) testing. Diabetes screening. This is done by checking your blood sugar (glucose) after you have not eaten for a while (fasting). You may have this done every 1-3 years. Bone density scan. This is done to screen for osteoporosis. You may have this done starting at age 1. Mammogram. This may be done every 1-2 years. Talk to your health care provider about how often you should have regular mammograms. Talk with  your health care provider about your test results, treatment options, and if necessary, the need for more tests. Vaccines  Your health care provider may recommend certain vaccines, such as: Influenza vaccine. This is recommended every year. Tetanus, diphtheria, and acellular pertussis (Tdap, Td) vaccine. You may need a Td booster every 10 years. Zoster vaccine. You may need this after age 60. Pneumococcal 13-valent  conjugate (PCV13) vaccine. One dose is recommended after age 14. Pneumococcal polysaccharide (PPSV23) vaccine. One dose is recommended after age 41. Talk to your health care provider about which screenings and vaccines you need and how often you need them. This information is not intended to replace advice given to you by your health care provider. Make sure you discuss any questions you have with your health care provider. Document Released: 02/12/2015 Document Revised: 10/06/2015 Document Reviewed: 11/17/2014 Elsevier Interactive Patient Education  2017 [place] Prevention in the Home Falls can cause injuries. They can happen to people of all ages. There are many things you can do to make your home safe and to help prevent falls. What can I do on the outside of my home? Regularly fix the edges of walkways and driveways and fix any cracks. Remove anything that might make you trip as you walk through a door, such as a raised step or threshold. Trim any bushes or trees on the path to your home. Use bright outdoor lighting. Clear any walking paths of anything that might make someone trip, such as rocks or tools. Regularly check to see if handrails are loose or broken. Make sure that both sides of any steps have handrails. Any raised decks and porches should have guardrails on the edges. Have any leaves, snow, or ice cleared regularly. Use sand or salt on walking paths during winter. Clean up any spills in your garage right away. This includes oil or grease spills. What can I do in the bathroom? Use night lights. Install grab bars by the toilet and in the tub and shower. Do not use towel bars as grab bars. Use non-skid mats or decals in the tub or shower. If you need to sit down in the shower, use a plastic, non-slip stool. Keep the floor dry. Clean up any water that spills on the floor as soon as it happens. Remove soap buildup in the tub or shower regularly. Attach bath mats  securely with double-sided non-slip rug tape. Do not have throw rugs and other things on the floor that can make you trip. What can I do in the bedroom? Use night lights. Make sure that you have a light by your bed that is easy to reach. Do not use any sheets or blankets that are too big for your bed. They should not hang down onto the floor. Have a firm chair that has side arms. You can use this for support while you get dressed. Do not have throw rugs and other things on the floor that can make you trip. What can I do in the kitchen? Clean up any spills right away. Avoid walking on wet floors. Keep items that you use a lot in easy-to-reach places. If you need to reach something above you, use a strong step stool that has a grab bar. Keep electrical cords out of the way. Do not use floor polish or wax that makes floors slippery. If you must use wax, use non-skid floor wax. Do not have throw rugs and other things on the floor that can make you trip.  What can I do with my stairs? Do not leave any items on the stairs. Make sure that there are handrails on both sides of the stairs and use them. Fix handrails that are broken or loose. Make sure that handrails are as long as the stairways. Check any carpeting to make sure that it is firmly attached to the stairs. Fix any carpet that is loose or worn. Avoid having throw rugs at the top or bottom of the stairs. If you do have throw rugs, attach them to the floor with carpet tape. Make sure that you have a light switch at the top of the stairs and the bottom of the stairs. If you do not have them, ask someone to add them for you. What else can I do to help prevent falls? Wear shoes that: Do not have high heels. Have rubber bottoms. Are comfortable and fit you well. Are closed at the toe. Do not wear sandals. If you use a stepladder: Make sure that it is fully opened. Do not climb a closed stepladder. Make sure that both sides of the stepladder  are locked into place. Ask someone to hold it for you, if possible. Clearly mark and make sure that you can see: Any grab bars or handrails. First and last steps. Where the edge of each step is. Use tools that help you move around (mobility aids) if they are needed. These include: Canes. Walkers. Scooters. Crutches. Turn on the lights when you go into a dark area. Replace any light bulbs as soon as they burn out. Set up your furniture so you have a clear path. Avoid moving your furniture around. If any of your floors are uneven, fix them. If there are any pets around you, be aware of where they are. Review your medicines with your doctor. Some medicines can make you feel dizzy. This can increase your chance of falling. Ask your doctor what other things that you can do to help prevent falls. This information is not intended to replace advice given to you by your health care provider. Make sure you discuss any questions you have with your health care provider. Document Released: 11/12/2008 Document Revised: 06/24/2015 Document Reviewed: 02/20/2014 Elsevier Interactive Patient Education  2017 [employer].

## 2020-10-21 NOTE — Progress Notes (Signed)
Subjective:   Tina Duarte is a 75 y.o. female who presents for Medicare Annual (Subsequent) preventive examination.  Review of Systems    No ROS.  Medicare Wellness Virtual Visit.  Visual/audio telehealth visit, UTA vital signs.   See social history for additional risk factors.   Cardiac Risk Factors include: advanced age (>20men, >63 women)     Objective:    Today's Vitals   10/21/20 1559  Weight: 226 lb (102.5 kg)  Height: 5\' 4"  (1.626 m)   Body mass index is 38.79 kg/m.  Advanced Directives 10/21/2020 04/05/2020 09/18/2019 08/20/2019 04/23/2019 04/19/2019 11/19/2018  Does Patient Have a Medical Advance Directive? No No No No No No No  Would patient like information on creating a medical advance directive? No - Patient declined No - Patient declined - No - Patient declined No - Patient declined No - Patient declined Yes (MAU/Ambulatory/Procedural Areas - Information given)    Current Medications (verified) Outpatient Encounter Medications as of 10/21/2020  Medication Sig   acetaminophen (TYLENOL) 500 MG tablet Take 1,500 mg by mouth daily as needed for headache (pain).   albuterol (ACCUNEB) 0.63 MG/3ML nebulizer solution Take 3 mLs (0.63 mg total) by nebulization every 6 (six) hours as needed for wheezing.   apixaban (ELIQUIS) 2.5 MG TABS tablet Take 1 tablet (2.5 mg total) by mouth 2 (two) times daily.   budesonide (PULMICORT) 0.5 MG/2ML nebulizer solution Take 2 mLs by nebulization 2 (two) times daily as needed (shortness of breath).   citalopram (CELEXA) 20 MG tablet TAKE 1 TABLET (20 MG TOTAL) BY MOUTH DAILY.   losartan (COZAAR) 50 MG tablet TAKE 1 TABLET (50 MG TOTAL) BY MOUTH DAILY WITH LUNCH.   Potassium 95 MG TABS Take 1 tablet by mouth daily.   rosuvastatin (CRESTOR) 5 MG tablet TAKE 1 TABLET (5 MG TOTAL) BY MOUTH DAILY.   No facility-administered encounter medications on file as of 10/21/2020.    Allergies (verified) Patient has no known allergies.   History: Past  Medical History:  Diagnosis Date   Allergy    Asthma    Chicken pox    COPD (chronic obstructive pulmonary disease) (HCC)    History of blood clots    Hypercholesterolemia    Hypertension    Retroperitoneal hematoma 03/2019   Past Surgical History:  Procedure Laterality Date   blood clots  2008   TUBAL LIGATION     Family History  Problem Relation Age of Onset   Cancer Mother        Breast   Heart disease Mother    Stroke Mother    Hypertension Mother    Breast cancer Mother        73's   Heart disease Father    Stroke Father    Hypertension Father    Diabetes Father    Colon cancer Other        paternal cousin   Social History   Socioeconomic History   Marital status: Widowed    Spouse name: Not on file   Number of children: Not on file   Years of education: Not on file   Highest education level: Not on file  Occupational History   Not on file  Tobacco Use   Smoking status: Never   Smokeless tobacco: Never  Vaping Use   Vaping Use: Never used  Substance and Sexual Activity   Alcohol use: No    Alcohol/week: 0.0 standard drinks   Drug use: No   Sexual activity:  Not on file  Other Topics Concern   Not on file  Social History Narrative   Lives in [place] with son.  never smoked; no alcohol. Used to work in [employer]    Social Determinants of Health   Financial Resource Strain: Low Risk    Difficulty of Paying Living Expenses: Not hard at all  Food Insecurity: No Food Insecurity   Worried About Charity fundraiser in the Last Year: Never true   [place] in the Last Year: Never true  Transportation Needs: No Transportation Needs   Lack of Transportation (Medical): No   Lack of Transportation (Non-Medical): No  Physical Activity: Not on file  Stress: No Stress Concern Present   Feeling of Stress : Not at all  Social Connections: Unknown   Frequency of Communication with Friends and Family: More than three times a week   Frequency of  Social Gatherings with Friends and Family: More than three times a week   Attends Religious Services: Not on Electrical engineer or Organizations: Not on file   Attends Archivist Meetings: Not on file   Marital Status: Not on file    Tobacco Counseling Counseling given: Not Answered   Clinical Intake:  Pre-visit preparation completed: Yes        Diabetes: No  How often do you need to have someone help you when you read instructions, pamphlets, or other written materials from your doctor or pharmacy?: 1 - Never    Interpreter Needed?: No      Activities of Daily Living In your present state of health, do you have any difficulty performing the following activities: 10/21/2020  Hearing? N  Vision? N  Difficulty concentrating or making decisions? N  Walking or climbing stairs? Y  Comment Chronic back and knee pain. Walker in use when ambulating. Injections received by Dr. Sabra Heck per patient.  Dressing or bathing? Y  Doing errands, shopping? Y  Comment She does not Physiological scientist and eating ? Y  Comment Family assist with meal prep  Using the Toilet? N  In the past six months, have you accidently leaked urine? N  Do you have problems with loss of bowel control? N  Managing your Medications? N  Managing your Finances? Y  Comment Son Writer or managing your Housekeeping? Y  Comment Family assist  Some recent data might be hidden    Patient Care Team: Einar Pheasant, MD as PCP - General (Internal Medicine)  Indicate any recent Medical Services you may have received from other than [facility] providers in the past year (date may be approximate).     Assessment:   This is a routine wellness examination for [place].  I connected with [name] today by telephone and verified that I am speaking with the correct person using two identifiers. Location patient: home Location provider: work Persons participating in the virtual visit:  patient, Marine scientist.    I discussed the limitations, risks, security and privacy concerns of performing an evaluation and management service by telephone and the availability of in person appointments. The patient expressed understanding and verbally consented to this telephonic visit.    Interactive audio and video telecommunications were attempted between this provider and patient, however failed, due to patient having technical difficulties OR patient did not have access to video capability.  We continued and completed visit with audio only.  Some vital signs may be absent or patient reported.   Hearing/Vision  screen Hearing Screening - Comments:: Patient is able to hear conversational tones without difficulty.  No issues reported. Vision Screening - Comments:: Followed by [facility]  Wears corrective lenses when reading  Visual acuity not assessed per patient preference   Dietary issues and exercise activities discussed:   Regular diet Good fluid intake   Goals Addressed               This Visit's Progress     Patient Stated     Weight (lb) < 230 lb (104.3 kg) (pt-stated)   226 lb (102.5 kg)     Healthy diet       Depression Screen PHQ 2/9 Scores 10/21/2020 09/28/2020 08/20/2019 11/19/2018 08/19/2018 08/14/2017 12/21/2015  PHQ - 2 Score 0 0 0 1 0 0 0  PHQ- 9 Score - - - 6 - - -    Fall Risk Fall Risk  10/21/2020 05/27/2020 08/20/2019 11/19/2018 08/19/2018  Falls in the past year? - 0 0 1 1  Number falls in past yr: - - - 0 0  Injury with Fall? - - - 0 0  Risk for fall due to : - - - Impaired mobility -  Follow up Falls evaluation completed Falls evaluation completed Falls evaluation completed Falls evaluation completed;Falls prevention discussed;Education provided -    FALL RISK PREVENTION PERTAINING TO THE HOME: Adequate lighting in your home to reduce risk of falls? Yes   ASSISTIVE DEVICES UTILIZED TO PREVENT FALLS: Life alert? No  Use of a cane, walker or w/c?  Yes   TIMED UP AND GO: Was the test performed? No .   Cognitive Function:  Patient is alert and oriented x3.   6CIT Screen 10/21/2020 08/20/2019 08/19/2018 08/14/2017  What Year? 0 points 0 points 0 points 0 points  What month? 0 points 0 points 0 points 0 points  What time? 0 points - 0 points 0 points  Count back from 20 0 points - 0 points 0 points  Months in reverse 0 points 2 points 0 points 0 points  Repeat phrase - 0 points 0 points 0 points  Total Score - - 0 0    Immunizations Immunization History  Administered Date(s) Administered   Fluad Quad(high Dose 65+) 12/11/2018   Influenza, High Dose Seasonal PF 04/10/2017   Influenza,inj,Quad PF,6+ Mos 10/21/2012, 01/07/2014, 03/07/2016   Influenza,inj,quad, With Preservative 03/02/2016   Influenza-Unspecified 02/28/2011, 12/16/2014   Pneumococcal Conjugate-13 04/30/2010   Pneumococcal Polysaccharide-23 05/23/2016   TDAP status: Due, Education has been provided regarding the importance of this vaccine. Advised may receive this vaccine at local pharmacy or Health Dept. Aware to provide a copy of the vaccination record if obtained from local pharmacy or Health Dept. Verbalized acceptance and understanding. Deferred.   Health Maintenance Health Maintenance  Topic Date Due   INFLUENZA VACCINE  08/30/2020   Zoster Vaccines- Shingrix (1 of 2) 12/29/2020 (Originally 10/07/1993)   TETANUS/TDAP  10/21/2021 (Originally 02/08/2020)   MAMMOGRAM  11/12/2020   DEXA SCAN  Completed   Hepatitis C Screening  Completed   HPV VACCINES  Aged Out   COVID-19 Vaccine  Discontinued   Lung Cancer Screening: (Low Dose CT Chest recommended if Age 38-80 years, 30 pack-year currently smoking OR have quit w/in 15years.) does not qualify.   Vision Screening: Recommended annual ophthalmology exams for early detection of glaucoma and other disorders of the eye.  Dental Screening: Recommended annual dental exams for proper oral hygiene.  Community Resource  Referral / Chronic  Care Management: CRR required this visit?  No   CCM required this visit?  No      Plan:   Keep all routine maintenance appointments.   I have personally reviewed and noted the following in the patient's chart:   Medical and social history Use of alcohol, tobacco or illicit drugs  Current medications and supplements including opioid prescriptions. Not taking opioid.  Functional ability and status Nutritional status Physical activity Advanced directives List of other physicians Hospitalizations, surgeries, and ER visits in previous 12 months Vitals Screenings to include cognitive, depression, and falls Referrals and appointments  In addition, I have reviewed and discussed with patient certain preventive protocols, quality metrics, and best practice recommendations. A written personalized care plan for preventive services as well as general preventive health recommendations were provided to patient via mychart.     Varney Biles, LPN   8/00/6349

## 2020-10-25 ENCOUNTER — Telehealth: Payer: Self-pay | Admitting: Internal Medicine

## 2020-10-25 DIAGNOSIS — E7800 Pure hypercholesterolemia, unspecified: Secondary | ICD-10-CM

## 2020-10-25 DIAGNOSIS — I10 Essential (primary) hypertension: Secondary | ICD-10-CM

## 2020-10-25 NOTE — Telephone Encounter (Signed)
Catie can help with getting assistance for eliquis. Ok to see if pt is agreeable to ccm referral?

## 2020-10-25 NOTE — Telephone Encounter (Signed)
Yes.  If agreeable ok for referral

## 2020-10-25 NOTE — Telephone Encounter (Signed)
Patient calling in and states she is in the donut hole. Her Eliquis cost over $500. Patient wanting to know what she needs to do. She has united healthcare medicare.   Please advise

## 2020-10-27 NOTE — Addendum Note (Signed)
Addended by: Lars Masson on: 10/27/2020 10:51 AM   Modules accepted: Orders

## 2020-10-27 NOTE — Telephone Encounter (Signed)
Patient agreeable. CCM referral placed. Patient has about a month left of her medication

## 2020-10-28 ENCOUNTER — Telehealth: Payer: Self-pay

## 2020-10-28 NOTE — Chronic Care Management (AMB) (Signed)
  Chronic Care Management   Note  10/28/2020 Name: Tina Duarte MRN: [id number] DOB: 12/31/1945  Tina Duarte is a 75 y.o. year old female who is a primary care patient of Einar Pheasant, MD. I reached out to [place] by phone today in response to a referral sent by Ms. [place] PCP.  Ms. Voshell was given information about Chronic Care Management services today including:  CCM service includes personalized support from designated clinical staff supervised by her physician, including individualized plan of care and coordination with other care providers 24/7 contact phone numbers for assistance for urgent and routine care needs. Service will only be billed when office clinical staff spend 20 minutes or more in a month to coordinate care. Only one practitioner may furnish and bill the service in a calendar month. The patient may stop CCM services at any time (effective at the end of the month) by phone call to the office staff. The patient is responsible for co-pay (up to 20% after annual deductible is met) if co-pay is required by the individual health plan.   Patient agreed to services and verbal consent obtained.   Follow up plan: Telephone appointment with care management team member scheduled for:11/02/2020  Noreene Larsson, [place], [place], [place] [address] Direct Dial: [phone] [email or web address]

## 2020-11-02 ENCOUNTER — Ambulatory Visit (INDEPENDENT_AMBULATORY_CARE_PROVIDER_SITE_OTHER): Payer: Medicare Other | Admitting: Pharmacist

## 2020-11-02 DIAGNOSIS — Z86718 Personal history of other venous thrombosis and embolism: Secondary | ICD-10-CM

## 2020-11-02 DIAGNOSIS — I10 Essential (primary) hypertension: Secondary | ICD-10-CM

## 2020-11-02 DIAGNOSIS — E7800 Pure hypercholesterolemia, unspecified: Secondary | ICD-10-CM

## 2020-11-02 DIAGNOSIS — D6859 Other primary thrombophilia: Secondary | ICD-10-CM

## 2020-11-02 DIAGNOSIS — D6851 Activated protein C resistance: Secondary | ICD-10-CM

## 2020-11-02 NOTE — Chronic Care Management (AMB) (Signed)
Chronic Care Management Pharmacy Note  11/02/2020 Name:  Tina Duarte MRN:  [id number] DOB:  [birth year]  Subjective: Tina Duarte is an 75 y.o. year old female who is a primary patient of Einar Pheasant, MD.  The CCM team was consulted for assistance with disease management and care coordination needs.    Engaged with patient by telephone for initial visit in response to provider referral for pharmacy case management and/or care coordination services.   Consent to Services:  The patient was given the following information about Chronic Care Management services today, agreed to services, and gave verbal consent: 1. CCM service includes personalized support from designated clinical staff supervised by the primary care provider, including individualized plan of care and coordination with other care providers 2. 24/7 contact phone numbers for assistance for urgent and routine care needs. 3. Service will only be billed when office clinical staff spend 20 minutes or more in a month to coordinate care. 4. Only one practitioner may furnish and bill the service in a calendar month. 5.The patient may stop CCM services at any time (effective at the end of the month) by phone call to the office staff. 6. The patient will be responsible for cost sharing (co-pay) of up to 20% of the service fee (after annual deductible is met). Patient agreed to services and consent obtained.  Patient Care Team: Einar Pheasant, MD as PCP - General (Internal Medicine) De Hollingshead, RPH-CPP (Pharmacist)   Objective:  Lab Results  Component Value Date   CREATININE 0.95 09/28/2020   CREATININE 1.04 05/27/2020   CREATININE 1.03 (H) 04/05/2020    Lab Results  Component Value Date   HGBA1C 5.9 09/28/2020   Last diabetic Eye exam: No results found for: HMDIABEYEEXA  Last diabetic Foot exam: No results found for: HMDIABFOOTEX      Component Value Date/Time   CHOL 173 09/28/2020 1056   TRIG 234.0 (H)  09/28/2020 1056   HDL 46.70 09/28/2020 1056   CHOLHDL 4 09/28/2020 1056   VLDL 46.8 (H) 09/28/2020 1056   LDLCALC 82 05/27/2020 1145   LDLDIRECT 89.0 09/28/2020 1056    Hepatic Function Latest Ref Rng & Units 09/28/2020 05/27/2020 05/13/2019  Total Protein 6.0 - 8.3 g/dL 6.8 6.8 7.5  Albumin 3.5 - 5.2 g/dL 4.0 4.2 4.0  AST 0 - 37 U/L _0 ALT 0 - 35 U/L _1 Alk Phosphatase 39 - 117 U/L 53 54 64  Total Bilirubin 0.2 - 1.2 mg/dL 0.7 0.9 1.4(H)  Bilirubin, Direct 0.0 - 0.3 mg/dL 0.1 0.1 -    Lab Results  Component Value Date/Time   TSH 2.54 09/28/2020 10:56 AM   TSH 5.54 (H) 07/15/2020 10:10 AM   FREET4 0.70 09/28/2020 10:56 AM   FREET4 0.72 07/15/2020 10:10 AM    CBC Latest Ref Rng & Units 04/05/2020 10/07/2019 09/18/2019  WBC 4.0 - 10.5 K/uL 8.0 9.5 6.0  Hemoglobin 12.0 - 15.0 g/dL 14.3 15.0 14.6  Hematocrit 36.0 - 46.0 % 43.8 46.2(H) 43.5  Platelets 150 - 400 K/uL 257 311 251      Social History   Tobacco Use  Smoking Status Never  Smokeless Tobacco Never   BP Readings from Last 3 Encounters:  09/28/20 128/80  05/27/20 138/70  04/05/20 (!) 147/89   Pulse Readings from Last 3 Encounters:  09/28/20 78  05/27/20 74  04/05/20 93   Wt Readings from Last 3 Encounters:  10/21/20 226 lb (102.5 kg)  09/28/20 226 lb (102.5 kg)  05/27/20 219 lb 12.8 oz (99.7 kg)    Assessment: Review of patient past medical history, allergies, medications, health status, including review of consultants reports, laboratory and other test data, was performed as part of comprehensive evaluation and provision of chronic care management services.   SDOH:  (Social Determinants of Health) assessments and interventions performed:  SDOH Interventions    Flowsheet Row Most Recent Value  SDOH Interventions   Financial Strain Interventions Other (Comment)  [manufacturer assistance]       CCM Care Plan  No Known Allergies  Medications Reviewed Today     Reviewed by De Hollingshead, RPH-CPP (Pharmacist) on 11/02/20 at 1442  Med List Status: <None>   Medication Order Taking? Sig Documenting Provider Last Dose Status Informant  acetaminophen (TYLENOL) 500 MG tablet [id number] Yes Take 1,500 mg by mouth daily as needed for headache (pain). [provider] Taking Active Child  albuterol (ACCUNEB) 0.63 MG/3ML nebulizer solution [id number] Yes Take 3 mLs (0.63 mg total) by nebulization every 6 (six) hours as needed for wheezing. Einar Pheasant, MD Taking Active   apixaban [facility]) 2.5 MG TABS tablet [id number] Yes Take 1 tablet (2.5 mg total) by mouth 2 (two) times daily. Einar Pheasant, MD Taking Active   budesonide (PULMICORT) 0.5 MG/2ML nebulizer solution [id number] Yes Take 2 mLs by nebulization 2 (two) times daily as needed (shortness of breath). [provider] Taking Active   citalopram (CELEXA) 20 MG tablet [id number] Yes TAKE 1 TABLET (20 MG TOTAL) BY MOUTH DAILY. Einar Pheasant, MD Taking Active   losartan (COZAAR) 50 MG tablet [id number] Yes TAKE 1 TABLET (50 MG TOTAL) BY MOUTH DAILY WITH LUNCH. Einar Pheasant, MD Taking Active   rosuvastatin (CRESTOR) 5 MG tablet [id number] Yes TAKE 1 TABLET (5 MG TOTAL) BY MOUTH DAILY. Einar Pheasant, MD Taking Active             Patient Active Problem List   Diagnosis Date Noted   Shoulder pain, left 09/28/2020   Swelling of limb 05/20/2019   Primary hypercoagulable state ([place]) 05/09/2019   Retroperitoneal hematoma 04/19/2019   Hypokalemia 04/19/2019   Abdominal pain 04/19/2019   Supratherapeutic INR 04/19/2019   Factor V Leiden ([place]) 04/19/2019   Left knee pain 12/15/2018   Weakness 08/25/2018   Respiratory failure with hypoxia ([place]) 02/10/2018   Acute bronchitis 01/31/2018   Muscle fatigue 07/24/2017   Hyperglycemia 09/12/2016   Sinusitis 04/05/2015   Sore throat 10/17/2014   Health care maintenance 09/20/2014   Heel pain 05/31/2014   Long term current use of anticoagulant therapy  04/04/2014   Leg pain 02/16/2014   Low back pain 12/28/2013   Stress 09/07/2013   Cough 04/16/2013   Left hip pain 12/05/2012   Arm vein blood clot 09/20/2012   Rib pain 08/28/2012   Essential hypertension, benign 05/22/2012   Hypercholesterolemia 05/22/2012   Environmental allergies 05/22/2012   Asthma 05/22/2012   History of blood clots 05/22/2012   Diarrhea 05/22/2012    Immunization History  Administered Date(s) Administered   Fluad Quad(high Dose 65+) 12/11/2018   Influenza, High Dose Seasonal PF 04/10/2017   Influenza,inj,Quad PF,6+ Mos 10/21/2012, 01/07/2014, 03/07/2016   Influenza,inj,quad, With Preservative 03/02/2016   Influenza-Unspecified 02/28/2011, 12/16/2014   Pneumococcal Conjugate-13 04/30/2010   Pneumococcal Polysaccharide-23 05/23/2016    Conditions to be addressed/monitored: HTN and HLD  Care Plan : Medication Management  Updates made by De Hollingshead, RPH-CPP since 11/02/2020 12:00 AM  Problem: DVT prophylaxis, Hypertension, Hyperlipidemia      Long-Range Goal: Medication Access   Start Date: 11/02/2020  This Visit's Progress: On track  Priority: High  Note:   Current Barriers:  Unable to independently afford treatment regimen  Pharmacist Clinical Goal(s):  Over the next 90 days, patient will verbalize ability to afford treatment regimen through collaboration with PharmD and provider.   Interventions: 1:1 collaboration with Einar Pheasant, MD regarding development and update of comprehensive plan of care as evidenced by provider attestation and co-signature Inter-disciplinary care team collaboration (see longitudinal plan of care) Comprehensive medication review performed; medication list updated in electronic medical record  DVT Prevention: Appropriately managed, current regimen: Eliquis 2.5 mg BID Reports that copay for this medication was >$400 recently, as she is in [facility] Coverage Gap. Reviewed income and out of pocket spend.  She qualifies for assistance from [employer]MS. Will collaborate w/ PCP, CPhT, and patient to pursue assistance.   Hypertension:  Goal Met. Current treatment: losartan 50 mg ;  Recommended to continue current regimen at this time  Hyperlipidemia:  Goal Met. Current treatment: rosuvastatin 5 mg daily;  Recommended to continue current regimen at this time  Depression/Anxiety:  Goal Met. Current treatment: citalopram 20 mg daily Recommended to continue current regimen at this time  Patient Goals/[place]-Care Activities Over the next 90 days, patient will:  - take medications as prescribed collaborate with provider on medication access solutions  Follow Up Plan: Telephone follow up appointment with care management team member scheduled for: 8 weeks      Medication Assistance: Application for Eliquis  medication assistance program. in process.  Anticipated assistance start date TBD.  See plan of care for additional detail.  Patient's preferred pharmacy is:  [id number] - [place], [place] [address] [place] [address] Phone: [phone] Fax: [phone]  [id number], [place] [address] Phone: [phone] Fax: [phone]    Follow Up:  Patient agrees to Care Plan and Follow-up.  Plan: Telephone follow up appointment with care management team member scheduled for:  ~8 weeks  Catie Darnelle Maffucci, PharmD, [place], [place] Clinical Pharmacist [employer] at [employer] [phone]

## 2020-11-02 NOTE — Patient Instructions (Signed)
Visit Information   PATIENT GOALS:   Goals Addressed               This Visit's Progress     Patient Stated     Medication Access (pt-stated)        Patient Goals/[place]-Care Activities Over the next 90 days, patient will:  - take medications as prescribed collaborate with provider on medication access solutions        Consent to CCM Services: Ms. Ferdig was given information about Chronic Care Management services including:  CCM service includes personalized support from designated clinical staff supervised by her physician, including individualized plan of care and coordination with other care providers 24/7 contact phone numbers for assistance for urgent and routine care needs. Service will only be billed when office clinical staff spend 20 minutes or more in a month to coordinate care. Only one practitioner may furnish and bill the service in a calendar month. The patient may stop CCM services at any time (effective at the end of the month) by phone call to the office staff. The patient will be responsible for cost sharing (co-pay) of up to 20% of the service fee (after annual deductible is met).  Patient agreed to services and verbal consent obtained.   Patient verbalizes understanding of instructions provided today and agrees to view in [place].   Plan: Telephone follow up appointment with care management team member scheduled for:  ~8 weeks  Catie Darnelle Maffucci, PharmD, [place], CPP Clinical Pharmacist [place] at [facility] [place]: Patient Care Plan: Medication Management     Problem Identified: DVT prophylaxis, Hypertension, Hyperlipidemia      Long-Range Goal: Medication Access   Start Date: 11/02/2020  This Visit's Progress: On track  Priority: High  Note:   Current Barriers:  Unable to independently afford treatment regimen  Pharmacist Clinical Goal(s):  Over the next 90 days, patient will verbalize ability to afford  treatment regimen through collaboration with PharmD and provider.   Interventions: 1:1 collaboration with Einar Pheasant, MD regarding development and update of comprehensive plan of care as evidenced by provider attestation and co-signature Inter-disciplinary care team collaboration (see longitudinal plan of care) Comprehensive medication review performed; medication list updated in electronic medical record  DVT Prevention: Appropriately managed, current regimen: Eliquis 2.5 mg BID Reports that copay for this medication was >$400 recently, as she is in [facility] Coverage Gap. Reviewed income and out of pocket spend. She qualifies for assistance from BMS. Will collaborate w/ PCP, CPhT, and patient to pursue assistance.   Hypertension:  Goal Met. Current treatment: losartan 50 mg ;  Recommended to continue current regimen at this time  Hyperlipidemia:  Goal Met. Current treatment: rosuvastatin 5 mg daily;  Recommended to continue current regimen at this time  Depression/Anxiety:  Goal Met. Current treatment: citalopram 20 mg daily Recommended to continue current regimen at this time  Patient Goals/[place]-Care Activities Over the next 90 days, patient will:  - take medications as prescribed collaborate with provider on medication access solutions  Follow Up Plan: Telephone follow up appointment with care management team member scheduled for: 8 weeks

## 2020-11-09 ENCOUNTER — Telehealth: Payer: Self-pay | Admitting: Pharmacy Technician

## 2020-11-09 DIAGNOSIS — J449 Chronic obstructive pulmonary disease, unspecified: Secondary | ICD-10-CM | POA: Diagnosis not present

## 2020-11-09 DIAGNOSIS — Z596 Low income: Secondary | ICD-10-CM

## 2020-11-09 NOTE — Progress Notes (Signed)
[place] [facility])                                            [place] Team    11/09/2020  Tina Duarte Jan 23, 1946 [id number]                                      Medication Assistance Referral  Referral From: [facility] Embedded RPh Catie T.   Medication/Company: Eliquis / BMS Patient application portion:  Mailed Provider application portion:  N/A Embedded PharmD had provider sign in clinic  to Dr. Einar Pheasant Provider address/fax verified via: Office website   Arianna Haydon P. Dhamar Gregory, [place]  [phone]

## 2020-11-15 ENCOUNTER — Ambulatory Visit
Admission: RE | Admit: 2020-11-15 | Discharge: 2020-11-15 | Disposition: A | Discharge status: Home / Self Care | Payer: Medicare Other | Source: Ambulatory Visit | Attending: Internal Medicine | Admitting: Internal Medicine

## 2020-11-15 ENCOUNTER — Other Ambulatory Visit: Payer: Self-pay

## 2020-11-15 DIAGNOSIS — Z1231 Encounter for screening mammogram for malignant neoplasm of breast: Secondary | ICD-10-CM | POA: Diagnosis not present

## 2020-11-29 DIAGNOSIS — I10 Essential (primary) hypertension: Secondary | ICD-10-CM | POA: Diagnosis not present

## 2020-11-29 DIAGNOSIS — E7800 Pure hypercholesterolemia, unspecified: Secondary | ICD-10-CM | POA: Diagnosis not present

## 2020-11-30 ENCOUNTER — Telehealth: Payer: Self-pay | Admitting: Pharmacy Technician

## 2020-11-30 DIAGNOSIS — Z596 Low income: Secondary | ICD-10-CM

## 2020-11-30 NOTE — Progress Notes (Signed)
Tina Duarte [facility])                                            [place] Team    11/30/2020  Tina Duarte Feb 16, 1945 [id number]  Received both patient and provider portion(s) of patient assistance application(s) for Eliquis. Faxed completed application and required documents into BMS.    Ayden Apodaca P. Tina Duarte, [place]  [phone]

## 2020-12-07 ENCOUNTER — Other Ambulatory Visit: Payer: Self-pay | Admitting: Internal Medicine

## 2020-12-09 ENCOUNTER — Telehealth: Payer: Self-pay | Admitting: Pharmacy Technician

## 2020-12-09 DIAGNOSIS — Z596 Low income: Secondary | ICD-10-CM

## 2020-12-09 NOTE — Progress Notes (Signed)
[place] [facility])                                            [place] Team    12/09/2020  Karsten Vaughn [place] 04/04/1945 [id number]  Care coordination call placed to BMS in regards to Eliquis application.  Spoke to [employer] who informed patient was DENIED on 12/02/20 as patient needs to submit OOP from pharmacy or EOB. Informed Gwenette Greet that an EOB was faxed in with the application. After reviewing the documents, Gwenette Greet was able to find the patient's OOP. However, Gwenette Greet says it appears she will still be short the 3% OOP by about $50 and BMS usually will require the 1st page of an EOB. With that being said, she informed she would still send it to processing and have it re evaluated as she is not a processor. She informed to check back in 24-48 business hours.  In basket message sent to embedded PharmD Catie Darnelle Maffucci requesting assistance in getting a new OOP for the patient to submit to BMS.  Indiya Izquierdo P. Haiden Rawlinson, [place]  [phone]

## 2020-12-10 DIAGNOSIS — J449 Chronic obstructive pulmonary disease, unspecified: Secondary | ICD-10-CM | POA: Diagnosis not present

## 2020-12-15 ENCOUNTER — Telehealth: Payer: Self-pay | Admitting: Pharmacy Technician

## 2020-12-15 ENCOUNTER — Telehealth: Payer: Self-pay | Admitting: Pharmacist

## 2020-12-15 DIAGNOSIS — Z596 Low income: Secondary | ICD-10-CM

## 2020-12-15 NOTE — Progress Notes (Signed)
[place] [facility])                                            [place] Team    12/15/2020  Keiran Sias [place] 1945/02/15 [id number]  Care coordination call placed to BMS in regards to Eliquis application.  Spoke to [facility] who informed patient was short of the 3%oop requirement by $34.07 and patient would need to submit that to BMS no later then 01/29/21.  In basket message sent to embedded PharmD for assistance in relaying information to the patient.  Aileen Amore P. Coady Train, [place]  [phone]

## 2020-12-15 NOTE — Telephone Encounter (Signed)
Attempted to call patient to discuss Eliquis application for assistance. Per CPhT, she is short by $34.70. She will need to have this in to BMS by 01/28/21.   Contacted patient, attempted to leave voicemail but mailbox was full. Will try again later today.

## 2020-12-17 NOTE — Telephone Encounter (Signed)
Attempted to contact patient regarding below. Unable to leave voicemail as mailbox is full.

## 2020-12-28 ENCOUNTER — Ambulatory Visit: Payer: Medicare Other | Admitting: Pharmacist

## 2020-12-28 DIAGNOSIS — I10 Essential (primary) hypertension: Secondary | ICD-10-CM

## 2020-12-28 DIAGNOSIS — D6859 Other primary thrombophilia: Secondary | ICD-10-CM

## 2020-12-28 DIAGNOSIS — Z86718 Personal history of other venous thrombosis and embolism: Secondary | ICD-10-CM

## 2020-12-28 DIAGNOSIS — E7800 Pure hypercholesterolemia, unspecified: Secondary | ICD-10-CM

## 2020-12-28 NOTE — Chronic Care Management (AMB) (Signed)
 Chronic Care Management CCM Pharmacy Note  12/28/2020 Name:  Tina Duarte MRN:  [id number] DOB:  01/10/1946  Summary: ~ $30 short on out of pocket spend requirement for Eliquis assistance. Patient declines submitting current out of pocket  Recommendations/Changes made from today's visit: - None. Closing CCM case due to goals of care being met  Subjective: Tina Duarte is an 75 y.o. year old female who is a primary patient of Scott, Charlene, MD.  The CCM team was consulted for assistance with disease management and care coordination needs.    Engaged with patient by telephone for follow up visit for pharmacy case management and/or care coordination services.   Objective:  Medications Reviewed Today     Reviewed by Travis, Catherine E, RPH-CPP (Pharmacist) on 11/02/20 at 1442  Med List Status: <None>   Medication Order Taking? Sig Documenting Provider Last Dose Status Informant  acetaminophen (TYLENOL) 500 MG tablet [id number] Yes Take 1,500 mg by mouth daily as needed for headache (pain). [provider] Taking Active Child  albuterol (ACCUNEB) 0.63 MG/3ML nebulizer solution [id number] Yes Take 3 mLs (0.63 mg total) by nebulization every 6 (six) hours as needed for wheezing. Scott, Charlene, MD Taking Active   apixaban (ELIQUIS) 2.5 MG TABS tablet [id number] Yes Take 1 tablet (2.5 mg total) by mouth 2 (two) times daily. Scott, Charlene, MD Taking Active   budesonide (PULMICORT) 0.5 MG/2ML nebulizer solution [id number] Yes Take 2 mLs by nebulization 2 (two) times daily as needed (shortness of breath). [provider] Taking Active   citalopram (CELEXA) 20 MG tablet [id number] Yes TAKE 1 TABLET (20 MG TOTAL) BY MOUTH DAILY. Scott, Charlene, MD Taking Active   losartan (COZAAR) 50 MG tablet [id number] Yes TAKE 1 TABLET (50 MG TOTAL) BY MOUTH DAILY WITH LUNCH. Scott, Charlene, MD Taking Active   rosuvastatin (CRESTOR) 5 MG tablet [id number] Yes TAKE 1 TABLET (5 MG TOTAL) BY  MOUTH DAILY. Scott, Charlene, MD Taking Active             Pertinent Labs:   Lab Results  Component Value Date   HGBA1C 5.9 09/28/2020   Lab Results  Component Value Date   CHOL 173 09/28/2020   HDL 46.70 09/28/2020   LDLCALC 82 05/27/2020   LDLDIRECT 89.0 09/28/2020   TRIG 234.0 (H) 09/28/2020   CHOLHDL 4 09/28/2020   Lab Results  Component Value Date   CREATININE 0.95 09/28/2020   BUN 10 09/28/2020   NA 139 09/28/2020   K 4.4 09/28/2020   CL 103 09/28/2020   CO2 29 09/28/2020    SDOH:  (Social Determinants of Health) assessments and interventions performed:  SDOH Interventions    Flowsheet Row Most Recent Value  SDOH Interventions   Financial Strain Interventions Intervention Not Indicated       CCM Care Plan  Review of patient past medical history, allergies, medications, health status, including review of consultants reports, laboratory and other test data, was performed as part of comprehensive evaluation and provision of chronic care management services.   Care Plan : Medication Management  Updates made by Travis, Catherine E, RPH-CPP since 12/28/2020 12:00 AM  Completed 12/28/2020   Problem: DVT prophylaxis, Hypertension, Hyperlipidemia Resolved 12/28/2020     Long-Range Goal: Medication Access Completed 12/28/2020  Start Date: 11/02/2020  Recent Progress: On track  Priority: High  Note:   Current Barriers:  Unable to independently afford treatment regimen  Pharmacist Clinical Goal(s):  Over the next 90 days, patient will verbalize ability   to afford treatment regimen through collaboration with PharmD and provider.   Interventions: 1:1 collaboration with Scott, Charlene, MD regarding development and update of comprehensive plan of care as evidenced by provider attestation and co-signature Inter-disciplinary care team collaboration (see longitudinal plan of care) Comprehensive medication review performed; medication list updated in electronic  medical record  DVT Prevention: Appropriately managed, current regimen: Eliquis 2.5 mg BID At time of submission, patient was ~$30 short of out of pocket spend requirement for Eliquis. Patient declines to resubmit current out of pocket spend. Reviewed that moving forward, if other expensive medications added or other cost concerns, she can reach out  Hypertension:   Controlled at last office visit; current treatment: losartan 50 mg ;  Previously recommended to continue current regimen at this time  Hyperlipidemia:   Controlled at last lab work; Current treatment: rosuvastatin 5 mg daily;  Previously recommended to continue current regimen at this time  Depression/Anxiety:   Controlled per patient report; current treatment: citalopram 20 mg daily Previously recommended to continue current regimen at this time  Patient Goals/[place]-Care Activities Over the next 90 days, patient will:  - take medications as prescribed collaborate with provider on medication access solutions      Plan: Closing CCM case, goals of care being met  Catie Travis, PharmD, BCACP, CPP Clinical Pharmacist [facility] at [place] [phone]      

## 2020-12-28 NOTE — Patient Instructions (Signed)
Visit Information  Following are the goals we discussed today:  Patient Goals/[place]-Care Activities Over the next 90 days, patient will:  - take medications as prescribed collaborate with provider on medication access solutions        Plan: Closing CCM case, goals of care being met   Catie Darnelle Maffucci, PharmD, [place], CPP Clinical Pharmacist [place] at [facility] [phone]   Please call the care guide team at [phone] if you need to cancel or reschedule your appointment.   Patient verbalizes understanding of instructions provided today and agrees to view in [place].

## 2021-01-09 DIAGNOSIS — J449 Chronic obstructive pulmonary disease, unspecified: Secondary | ICD-10-CM | POA: Diagnosis not present

## 2021-01-13 ENCOUNTER — Encounter: Payer: Self-pay | Admitting: Internal Medicine

## 2021-01-14 ENCOUNTER — Telehealth: Payer: Medicare Other

## 2021-01-14 NOTE — Telephone Encounter (Signed)
Called patient. Advised that she needed to be evaluated. Scheduled for [facility] urgent care and patient is going to take home COVID test

## 2021-02-03 ENCOUNTER — Encounter: Payer: Self-pay | Admitting: Internal Medicine

## 2021-02-03 ENCOUNTER — Other Ambulatory Visit: Payer: Self-pay

## 2021-02-03 ENCOUNTER — Ambulatory Visit (INDEPENDENT_AMBULATORY_CARE_PROVIDER_SITE_OTHER): Payer: Medicare Other | Admitting: Internal Medicine

## 2021-02-03 VITALS — BP 124/70 | HR 90 | Temp 97.8°F | Resp 16 | Ht 64.0 in | Wt 224.6 lb

## 2021-02-03 DIAGNOSIS — Z1211 Encounter for screening for malignant neoplasm of colon: Secondary | ICD-10-CM

## 2021-02-03 DIAGNOSIS — M5442 Lumbago with sciatica, left side: Secondary | ICD-10-CM

## 2021-02-03 DIAGNOSIS — I10 Essential (primary) hypertension: Secondary | ICD-10-CM | POA: Diagnosis not present

## 2021-02-03 DIAGNOSIS — D6851 Activated protein C resistance: Secondary | ICD-10-CM | POA: Diagnosis not present

## 2021-02-03 DIAGNOSIS — R739 Hyperglycemia, unspecified: Secondary | ICD-10-CM | POA: Diagnosis not present

## 2021-02-03 DIAGNOSIS — D6859 Other primary thrombophilia: Secondary | ICD-10-CM

## 2021-02-03 DIAGNOSIS — R0989 Other specified symptoms and signs involving the circulatory and respiratory systems: Secondary | ICD-10-CM

## 2021-02-03 DIAGNOSIS — E7800 Pure hypercholesterolemia, unspecified: Secondary | ICD-10-CM

## 2021-02-03 DIAGNOSIS — J4520 Mild intermittent asthma, uncomplicated: Secondary | ICD-10-CM

## 2021-02-03 LAB — LIPID PANEL
Cholesterol: 168 mg/dL (ref 0–200)
HDL: 54.4 mg/dL (ref >39.00)
LDL Cholesterol: 92 mg/dL (ref 0–99)
NonHDL: 113.51
Total CHOL/HDL Ratio: 3
Triglycerides: 110 mg/dL (ref 0.0–149.0)
VLDL: 22 mg/dL (ref 0.0–40.0)

## 2021-02-03 LAB — BASIC METABOLIC PANEL
BUN: 19 mg/dL (ref 6–23)
CO2: 29 mEq/L (ref 19–32)
Calcium: 8.9 mg/dL (ref 8.4–10.5)
Chloride: 106 mEq/L (ref 96–112)
Creatinine, Ser: 1.06 mg/dL (ref 0.40–1.20)
GFR: 50.74 mL/min — ABNORMAL LOW (ref >60.00)
Glucose, Bld: 89 mg/dL (ref 70–99)
Potassium: 4.2 mEq/L (ref 3.5–5.1)
Sodium: 140 mEq/L (ref 135–145)

## 2021-02-03 LAB — CBC WITH DIFFERENTIAL/PLATELET
Basophils Absolute: 0 10*3/uL (ref 0.0–0.1)
Basophils Relative: 0.6 % (ref 0.0–3.0)
Eosinophils Absolute: 0.3 10*3/uL (ref 0.0–0.7)
Eosinophils Relative: 4.8 % (ref 0.0–5.0)
HCT: 40.6 % (ref 36.0–46.0)
Hemoglobin: 13.5 g/dL (ref 12.0–15.0)
Lymphocytes Relative: 37.3 % (ref 12.0–46.0)
Lymphs Abs: 2.3 10*3/uL (ref 0.7–4.0)
MCHC: 33.2 g/dL (ref 30.0–36.0)
MCV: 91.8 fl (ref 78.0–100.0)
Monocytes Absolute: 0.5 10*3/uL (ref 0.1–1.0)
Monocytes Relative: 7.4 % (ref 3.0–12.0)
Neutro Abs: 3.1 10*3/uL (ref 1.4–7.7)
Neutrophils Relative %: 49.9 % (ref 43.0–77.0)
Platelets: 268 10*3/uL (ref 150.0–400.0)
RBC: 4.42 Mil/uL (ref 3.87–5.11)
RDW: 13.5 % (ref 11.5–15.5)
WBC: 6.1 10*3/uL (ref 4.0–10.5)

## 2021-02-03 LAB — TSH: TSH: 6.65 u[IU]/mL — ABNORMAL HIGH (ref 0.35–5.50)

## 2021-02-03 LAB — HEPATIC FUNCTION PANEL
ALT: 11 U/L (ref 0–35)
AST: 14 U/L (ref 0–37)
Albumin: 4 g/dL (ref 3.5–5.2)
Alkaline Phosphatase: 50 U/L (ref 39–117)
Bilirubin, Direct: 0.1 mg/dL (ref 0.0–0.3)
Total Bilirubin: 0.8 mg/dL (ref 0.2–1.2)
Total Protein: 6.5 g/dL (ref 6.0–8.3)

## 2021-02-03 LAB — HEMOGLOBIN A1C: Hgb A1c MFr Bld: 5.8 % (ref 4.6–6.5)

## 2021-02-03 NOTE — Progress Notes (Signed)
[name], female   DOB: 03-17-1945, 76 y.o.   MRN: [id number]   Subjective:    [name], female    DOB: [birth year], 76 y.o.   MRN: [id number]  This visit occurred during the SARS-CoV-2 public health emergency.  Safety protocols were in place, including screening questions prior to the visit, additional usage of staff PPE, and extensive cleaning of exam room while observing appropriate contact time as indicated for disinfecting solutions.   Patient here for a scheduled follow up.    HPI Here to follow up regarding her blood pressure, cholesterol and her breathing.  States she was sick a few weeks ago.  Took benadryl and mucinex.  Symptoms improved/resolved.  Feels her breathing is at baseline (and stable).  No increased cough or congestion now.  Some runny nose, but no other symptoms.  Discussed using steroid nasal spray.  No chest pain.  No acid reflux reported.  No abdominal pain or bowel change reported.  Has issues with her knees and her back.  Has had injections previously.  Discussed further treatment and evaluation.  Will notify me when agreeable.     Past Medical History:  Diagnosis Date   Allergy    Asthma    Chicken pox    COPD (chronic obstructive pulmonary disease) ([place])    History of blood clots    Hypercholesterolemia    Hypertension    Retroperitoneal hematoma 03/2019   Past Surgical History:  Procedure Laterality Date   blood clots  2008   TUBAL LIGATION     Family History  Problem Relation Age of Onset   Cancer Mother        Breast   Heart disease Mother    Stroke Mother    Hypertension Mother    Breast cancer Mother        17's   Heart disease Father    Stroke Father    Hypertension Father    Diabetes Father    Colon cancer Other        paternal cousin   Social History   Socioeconomic History   Marital status: Widowed    Spouse name: Not on file   Number of children: Not on file   Years of education: Not on file    Highest education level: Not on file  Occupational History   Not on file  Tobacco Use   Smoking status: Never   Smokeless tobacco: Never  Vaping Use   Vaping Use: Never used  Substance and Sexual Activity   Alcohol use: No    Alcohol/week: 0.0 standard drinks   Drug use: No   Sexual activity: Not on file  Other Topics Concern   Not on file  Social History Narrative   Lives in [place] with son.  never smoked; no alcohol. Used to work in [employer]    Social Determinants of Health   Financial Resource Strain: Low Risk    Difficulty of Paying Living Expenses: Not very hard  Food Insecurity: No Food Insecurity   Worried About Charity fundraiser in the Last Year: Never true   Ran Out of Food in the Last Year: Never true  Transportation Needs: No Transportation Needs   Lack of Transportation (Medical): No   Lack of Transportation (Non-Medical): No  Physical Activity: Not on file  Stress: No Stress Concern Present   Feeling of Stress : Not at all  Social Connections: Unknown   Frequency of Communication with  Friends and Family: More than three times a week   Frequency of Social Gatherings with Friends and Family: More than three times a week   Attends Religious Services: Not on Electrical engineer or Organizations: Not on file   Attends Archivist Meetings: Not on file   Marital Status: Not on file    Review of Systems  Constitutional:  Negative for appetite change, fever and unexpected weight change.  HENT:  Negative for sinus pressure.        Runny nose.  No significant congestion currently.    Respiratory:  Negative for cough and chest tightness.        Breathing stable.   Cardiovascular:  Negative for chest pain, palpitations and leg swelling.  Gastrointestinal:  Negative for abdominal pain, diarrhea, nausea and vomiting.  Genitourinary:  Negative for difficulty urinating and dysuria.  Musculoskeletal:  Positive for back pain. Negative for  myalgias.       Knee pain  Skin:  Negative for color change and rash.  Neurological:  Negative for dizziness, light-headedness and headaches.  Psychiatric/Behavioral:  Negative for agitation and dysphoric mood.       Objective:     BP 124/70    Pulse 90    Temp 97.8 F (36.6 C)    Resp 16    Ht '5\' 4"'  (1.626 m)    Wt 224 lb 9.6 oz (101.9 kg)    LMP 04/29/1997    SpO2 97%    BMI 38.55 kg/m  Wt Readings from Last 3 Encounters:  02/03/21 224 lb 9.6 oz (101.9 kg)  10/21/20 226 lb (102.5 kg)  09/28/20 226 lb (102.5 kg)    Physical Exam Vitals reviewed.  Constitutional:      General: She is not in acute distress.    Appearance: Normal appearance.  HENT:     Head: Normocephalic and atraumatic.     Right Ear: External ear normal.     Left Ear: External ear normal.  Eyes:     General: No scleral icterus.       Right eye: No discharge.        Left eye: No discharge.     Conjunctiva/sclera: Conjunctivae normal.  Neck:     Thyroid: No thyromegaly.  Cardiovascular:     Rate and Rhythm: Normal rate and regular rhythm.  Pulmonary:     Effort: No respiratory distress.     Breath sounds: Normal breath sounds. No wheezing.  Abdominal:     General: Bowel sounds are normal.     Palpations: Abdomen is soft.     Tenderness: There is no abdominal tenderness.  Musculoskeletal:        General: No swelling or tenderness.     Cervical back: Neck supple. No tenderness.  Lymphadenopathy:     Cervical: No cervical adenopathy.  Skin:    Findings: No erythema or rash.  Neurological:     Mental Status: She is alert.  Psychiatric:        Mood and Affect: Mood normal.        Behavior: Behavior normal.     Outpatient Encounter Medications as of 02/03/2021  Medication Sig   acetaminophen (TYLENOL) 500 MG tablet Take 1,500 mg by mouth daily as needed for headache (pain).   albuterol (ACCUNEB) 0.63 MG/3ML nebulizer solution Take 3 mLs (0.63 mg total) by nebulization every 6 (six) hours as needed  for wheezing.   apixaban (ELIQUIS) 2.5 MG TABS tablet Take  1 tablet (2.5 mg total) by mouth 2 (two) times daily.   budesonide (PULMICORT) 0.5 MG/2ML nebulizer solution Take 2 mLs by nebulization 2 (two) times daily as needed (shortness of breath).   citalopram (CELEXA) 20 MG tablet TAKE 1 TABLET (20 MG TOTAL) BY MOUTH DAILY.   losartan (COZAAR) 50 MG tablet TAKE 1 TABLET (50 MG TOTAL) BY MOUTH DAILY WITH LUNCH.   rosuvastatin (CRESTOR) 5 MG tablet TAKE 1 TABLET (5 MG TOTAL) BY MOUTH DAILY.   No facility-administered encounter medications on file as of 02/03/2021.     Lab Results  Component Value Date   WBC 6.1 02/03/2021   HGB 13.5 02/03/2021   HCT 40.6 02/03/2021   PLT 268.0 02/03/2021   GLUCOSE 89 02/03/2021   CHOL 168 02/03/2021   TRIG 110.0 02/03/2021   HDL 54.40 02/03/2021   LDLDIRECT 89.0 09/28/2020   LDLCALC 92 02/03/2021   ALT 11 02/03/2021   AST 14 02/03/2021   NA 140 02/03/2021   K 4.2 02/03/2021   CL 106 02/03/2021   CREATININE 1.06 02/03/2021   BUN 19 02/03/2021   CO2 29 02/03/2021   TSH 6.65 (H) 02/03/2021   INR 1.2 04/23/2019   HGBA1C 5.8 02/03/2021    MM 3D SCREEN BREAST BILATERAL  Result Date: 11/19/2020 CLINICAL DATA:  Screening. EXAM: DIGITAL SCREENING BILATERAL MAMMOGRAM WITH TOMOSYNTHESIS AND CAD TECHNIQUE: Bilateral screening digital craniocaudal and mediolateral oblique mammograms were obtained. Bilateral screening digital breast tomosynthesis was performed. The images were evaluated with computer-aided detection. COMPARISON:  Previous exam(s). ACR Breast Density Category b: There are scattered areas of fibroglandular density. FINDINGS: There are no findings suspicious for malignancy. IMPRESSION: No mammographic evidence of malignancy. A result letter of this screening mammogram will be mailed directly to the patient. RECOMMENDATION: Screening mammogram in one year. (Code:SM-B-01Y) BI-RADS CATEGORY  1: Negative. Electronically Signed   By: Abelardo Diesel M.D.    On: 11/19/2020 11:25      Assessment & Plan:   Problem List Items Addressed This Visit     Asthma    Reports breathing is stable.  No increased cough or congestion.  Follow.       Colon cancer screening    Due colonoscopy.  Reports recently found out father's side of family - history of colon cancer.  Agreeable to referral.        Relevant Orders   Ambulatory referral to Gastroenterology   Essential hypertension, benign - Primary    Continue losartan.  Blood pressure doing well.  Follow pressures.  Follow metabolic panel.       Relevant Orders   CBC with Differential/Platelet (Completed)   Basic metabolic panel (Completed)   Factor V Leiden ([place])    Continue eliquis as outlined.        Hypercholesterolemia    Continue crestor.  Low cholesterol diet and exercise.  Follow lipid panel and liver function tests.        Relevant Orders   Hepatic function panel (Completed)   Lipid panel (Completed)   TSH (Completed)   Hyperglycemia    Low carb diet and exercise.  Follow met b and a1c.       Relevant Orders   Hemoglobin A1c (Completed)   Low back pain    Has been evaluated previously.  Is s/p previous injection.  Helped.  Discussed f/u for her back.  Discussed f/u injection.  Will notify me when agreeable.       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  Will need indefinite anticoagulation.        Runny nose    Steroid nasal spray as directed.  Follow.         Einar Pheasant, MD

## 2021-02-04 ENCOUNTER — Encounter: Payer: Self-pay | Admitting: Internal Medicine

## 2021-02-04 DIAGNOSIS — R0989 Other specified symptoms and signs involving the circulatory and respiratory systems: Secondary | ICD-10-CM | POA: Insufficient documentation

## 2021-02-04 DIAGNOSIS — Z1211 Encounter for screening for malignant neoplasm of colon: Secondary | ICD-10-CM | POA: Insufficient documentation

## 2021-02-04 NOTE — Assessment & Plan Note (Signed)
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  Will need indefinite anticoagulation.

## 2021-02-04 NOTE — Assessment & Plan Note (Signed)
Steroid nasal spray as directed.  Follow.

## 2021-02-04 NOTE — Assessment & Plan Note (Signed)
Reports breathing is stable.  No increased cough or congestion.  Follow.

## 2021-02-04 NOTE — Assessment & Plan Note (Signed)
Continue losartan.  Blood pressure doing well.  Follow pressures.  Follow metabolic panel.  

## 2021-02-04 NOTE — Assessment & Plan Note (Signed)
Low carb diet and exercise.  Follow met b and a1c.

## 2021-02-04 NOTE — Assessment & Plan Note (Signed)
Has been evaluated previously.  Is s/p previous injection.  Helped.  Discussed f/u for her back.  Discussed f/u injection.  Will notify me when agreeable.

## 2021-02-04 NOTE — Assessment & Plan Note (Signed)
Continue eliquis as outlined.

## 2021-02-04 NOTE — Assessment & Plan Note (Signed)
Continue crestor.  Low cholesterol diet and exercise. Follow lipid panel and liver function tests.   

## 2021-02-04 NOTE — Assessment & Plan Note (Signed)
Due colonoscopy.  Reports recently found out father's side of family - history of colon cancer.  Agreeable to referral.

## 2021-02-08 ENCOUNTER — Telehealth: Payer: Self-pay

## 2021-02-08 NOTE — Telephone Encounter (Signed)
LMTCB for labs. 

## 2021-02-09 DIAGNOSIS — J449 Chronic obstructive pulmonary disease, unspecified: Secondary | ICD-10-CM | POA: Diagnosis not present

## 2021-02-10 ENCOUNTER — Other Ambulatory Visit: Payer: Self-pay | Admitting: Internal Medicine

## 2021-02-21 ENCOUNTER — Other Ambulatory Visit: Payer: Self-pay | Admitting: Internal Medicine

## 2021-03-01 ENCOUNTER — Other Ambulatory Visit: Payer: Self-pay

## 2021-03-01 DIAGNOSIS — R7989 Other specified abnormal findings of blood chemistry: Secondary | ICD-10-CM

## 2021-03-12 DIAGNOSIS — J449 Chronic obstructive pulmonary disease, unspecified: Secondary | ICD-10-CM | POA: Diagnosis not present

## 2021-03-23 ENCOUNTER — Other Ambulatory Visit: Payer: Medicare Other

## 2021-03-30 DIAGNOSIS — Z86010 Personal history of colonic polyps: Secondary | ICD-10-CM | POA: Diagnosis not present

## 2021-03-30 DIAGNOSIS — Z7901 Long term (current) use of anticoagulants: Secondary | ICD-10-CM | POA: Diagnosis not present

## 2021-03-30 DIAGNOSIS — R195 Other fecal abnormalities: Secondary | ICD-10-CM | POA: Diagnosis not present

## 2021-04-04 ENCOUNTER — Other Ambulatory Visit: Payer: Self-pay

## 2021-04-04 ENCOUNTER — Other Ambulatory Visit: Payer: Self-pay | Admitting: Internal Medicine

## 2021-04-04 ENCOUNTER — Other Ambulatory Visit (INDEPENDENT_AMBULATORY_CARE_PROVIDER_SITE_OTHER): Payer: Medicare Other

## 2021-04-04 DIAGNOSIS — R7989 Other specified abnormal findings of blood chemistry: Secondary | ICD-10-CM

## 2021-04-04 NOTE — Addendum Note (Signed)
Addended by: Leeanne Rio on: 04/04/2021 09:23 AM   Modules accepted: Orders

## 2021-04-05 LAB — TSH: TSH: 2.12 mIU/L (ref 0.40–4.50)

## 2021-04-05 LAB — T4: T4, Total: 8.6 ug/dL (ref 5.1–11.9)

## 2021-04-09 DIAGNOSIS — J449 Chronic obstructive pulmonary disease, unspecified: Secondary | ICD-10-CM | POA: Diagnosis not present

## 2021-05-10 DIAGNOSIS — J449 Chronic obstructive pulmonary disease, unspecified: Secondary | ICD-10-CM | POA: Diagnosis not present

## 2021-05-13 ENCOUNTER — Telehealth: Payer: Self-pay | Admitting: Internal Medicine

## 2021-05-13 NOTE — Telephone Encounter (Signed)
Left message to remind patient of her appointment on 06/02/21. This will be for her medical clearance and 4 month. Follow up.  ?

## 2021-06-02 ENCOUNTER — Ambulatory Visit (INDEPENDENT_AMBULATORY_CARE_PROVIDER_SITE_OTHER): Payer: Medicare Other | Admitting: Internal Medicine

## 2021-06-02 ENCOUNTER — Encounter: Payer: Self-pay | Admitting: Internal Medicine

## 2021-06-02 VITALS — BP 122/82 | HR 76 | Temp 98.3°F | Resp 19 | Ht 64.0 in | Wt 225.0 lb

## 2021-06-02 DIAGNOSIS — R531 Weakness: Secondary | ICD-10-CM

## 2021-06-02 DIAGNOSIS — R739 Hyperglycemia, unspecified: Secondary | ICD-10-CM

## 2021-06-02 DIAGNOSIS — Z1211 Encounter for screening for malignant neoplasm of colon: Secondary | ICD-10-CM | POA: Diagnosis not present

## 2021-06-02 DIAGNOSIS — R42 Dizziness and giddiness: Secondary | ICD-10-CM | POA: Diagnosis not present

## 2021-06-02 DIAGNOSIS — E7800 Pure hypercholesterolemia, unspecified: Secondary | ICD-10-CM | POA: Diagnosis not present

## 2021-06-02 DIAGNOSIS — D6851 Activated protein C resistance: Secondary | ICD-10-CM

## 2021-06-02 DIAGNOSIS — J4520 Mild intermittent asthma, uncomplicated: Secondary | ICD-10-CM | POA: Diagnosis not present

## 2021-06-02 DIAGNOSIS — I10 Essential (primary) hypertension: Secondary | ICD-10-CM | POA: Diagnosis not present

## 2021-06-02 DIAGNOSIS — D6859 Other primary thrombophilia: Secondary | ICD-10-CM | POA: Diagnosis not present

## 2021-06-02 LAB — CBC WITH DIFFERENTIAL/PLATELET
Basophils Absolute: 0.1 10*3/uL (ref 0.0–0.1)
Basophils Relative: 0.8 % (ref 0.0–3.0)
Eosinophils Absolute: 0.3 10*3/uL (ref 0.0–0.7)
Eosinophils Relative: 3.4 % (ref 0.0–5.0)
HCT: 42.5 % (ref 36.0–46.0)
Hemoglobin: 13.9 g/dL (ref 12.0–15.0)
Lymphocytes Relative: 33.3 % (ref 12.0–46.0)
Lymphs Abs: 3.1 10*3/uL (ref 0.7–4.0)
MCHC: 32.6 g/dL (ref 30.0–36.0)
MCV: 90.3 fl (ref 78.0–100.0)
Monocytes Absolute: 0.6 10*3/uL (ref 0.1–1.0)
Monocytes Relative: 6.4 % (ref 3.0–12.0)
Neutro Abs: 5.2 10*3/uL (ref 1.4–7.7)
Neutrophils Relative %: 56.1 % (ref 43.0–77.0)
Platelets: 266 10*3/uL (ref 150.0–400.0)
RBC: 4.71 Mil/uL (ref 3.87–5.11)
RDW: 13.2 % (ref 11.5–15.5)
WBC: 9.2 10*3/uL (ref 4.0–10.5)

## 2021-06-02 LAB — LIPID PANEL
Cholesterol: 159 mg/dL (ref 0–200)
HDL: 47.4 mg/dL (ref >39.00)
LDL Cholesterol: 76 mg/dL (ref 0–99)
NonHDL: 111.82
Total CHOL/HDL Ratio: 3
Triglycerides: 179 mg/dL — ABNORMAL HIGH (ref 0.0–149.0)
VLDL: 35.8 mg/dL (ref 0.0–40.0)

## 2021-06-02 LAB — HEPATIC FUNCTION PANEL
ALT: 12 U/L (ref 0–35)
AST: 18 U/L (ref 0–37)
Albumin: 4.3 g/dL (ref 3.5–5.2)
Alkaline Phosphatase: 63 U/L (ref 39–117)
Bilirubin, Direct: 0.2 mg/dL (ref 0.0–0.3)
Total Bilirubin: 1 mg/dL (ref 0.2–1.2)
Total Protein: 7.2 g/dL (ref 6.0–8.3)

## 2021-06-02 LAB — BASIC METABOLIC PANEL
BUN: 13 mg/dL (ref 6–23)
CO2: 31 mEq/L (ref 19–32)
Calcium: 8.9 mg/dL (ref 8.4–10.5)
Chloride: 101 mEq/L (ref 96–112)
Creatinine, Ser: 1.12 mg/dL (ref 0.40–1.20)
GFR: 47.38 mL/min — ABNORMAL LOW (ref >60.00)
Glucose, Bld: 92 mg/dL (ref 70–99)
Potassium: 4.3 mEq/L (ref 3.5–5.1)
Sodium: 137 mEq/L (ref 135–145)

## 2021-06-02 LAB — HEMOGLOBIN A1C: Hgb A1c MFr Bld: 5.8 % (ref 4.6–6.5)

## 2021-06-02 NOTE — Progress Notes (Signed)
[name], female   DOB: 1945/07/19, 76 y.o.   MRN: [id number] ? ? ?Subjective:  ? ? [name], female    DOB: 06/22/1945, 76 y.o.   MRN: [id number] ? ?This visit occurred during the SARS-CoV-2 public health emergency.  Safety protocols were in place, including screening questions prior to the visit, additional usage of staff PPE, and extensive cleaning of exam room while observing appropriate contact time as indicated for disinfecting solutions.  ? ?Patient here for a scheduled follow up.   ? ?HPI ?Here to follow up regarding hypercholesterolemia and hypertension.  She is also needing clearance for upcoming colonoscopy.  She reports currently having dizziness.  States 05/30/21 am - raised head to get out of bed and noticed increased dizziness - things moving.  States felt similar to previous vertigo.  Some dry heaves.  No headache.  Took dramamine.  The following day, stayed in bed most of the day.  Felt some better.  Yesterday - felt ok.  No nausea or vomiting.  Today - symptoms have worsened again. Does not feel as bad as she did the beginning of the week, but still with some dizziness.  States she blew her nose and symptoms worsened.  No increased sinus pressure.  No increased congestion or cough.  She feels her breathing is stable.  No abdominal pain.  Bowels moving.   ? ? ?Past Medical History:  ?Diagnosis Date  ? Allergy   ? Asthma   ? Chicken pox   ? COPD (chronic obstructive pulmonary disease) ([place])   ? History of blood clots   ? Hypercholesterolemia   ? Hypertension   ? Retroperitoneal hematoma 03/2019  ? ?Past Surgical History:  ?Procedure Laterality Date  ? blood clots  2008  ? TUBAL LIGATION    ? ?Family History  ?Problem Relation Age of Onset  ? Cancer Mother   ?     Breast  ? Heart disease Mother   ? Stroke Mother   ? Hypertension Mother   ? Breast cancer Mother   ?     75's  ? Heart disease Father   ? Stroke Father   ? Hypertension Father   ? Diabetes Father   ? Colon cancer  Other   ?     paternal cousin  ? ?Social History  ? ?Socioeconomic History  ? Marital status: Widowed  ?  Spouse name: Not on file  ? Number of children: Not on file  ? Years of education: Not on file  ? Highest education level: Not on file  ?Occupational History  ? Not on file  ?Tobacco Use  ? Smoking status: Never  ? Smokeless tobacco: Never  ?Vaping Use  ? Vaping Use: Never used  ?Substance and Sexual Activity  ? Alcohol use: No  ?  Alcohol/week: 0.0 standard drinks  ? Drug use: No  ? Sexual activity: Not on file  ?Other Topics Concern  ? Not on file  ?Social History Narrative  ? Lives in [place] with son.  never smoked; no alcohol. Used to work in [employer]   ? ?Social Determinants of Health  ? ?Financial Resource Strain: Low Risk   ? Difficulty of Paying Living Expenses: Not very hard  ?Food Insecurity: No Food Insecurity  ? Worried About Charity fundraiser in the Last Year: Never true  ? Ran Out of Food in the Last Year: Never true  ?Transportation Needs: No Transportation Needs  ? Lack of  Transportation (Medical): No  ? Lack of Transportation (Non-Medical): No  ?Physical Activity: Not on file  ?Stress: No Stress Concern Present  ? Feeling of Stress : Not at all  ?Social Connections: Unknown  ? Frequency of Communication with Friends and Family: More than three times a week  ? Frequency of Social Gatherings with Friends and Family: More than three times a week  ? Attends Religious Services: Not on file  ? Active Member of Clubs or Organizations: Not on file  ? Attends Archivist Meetings: Not on file  ? Marital Status: Not on file  ? ? ? ?Review of Systems  ?Constitutional:  Negative for appetite change and unexpected weight change.  ?HENT:  Negative for congestion and sinus pressure.   ?Respiratory:  Negative for cough and chest tightness.   ?     Breathing stable.   ?Cardiovascular:  Negative for chest pain and palpitations.  ?Gastrointestinal:  Negative for abdominal pain and diarrhea.  ?      Previous dry heaves.    ?Genitourinary:  Negative for difficulty urinating and dysuria.  ?Musculoskeletal:  Negative for joint swelling and myalgias.  ?Neurological:  Positive for dizziness and light-headedness. Negative for headaches.  ?Psychiatric/Behavioral:  Negative for agitation and dysphoric mood.   ? ?   ?Objective:  ?  ? ?BP 122/82 (BP Location: Left Arm, Patient Position: Sitting, Cuff Size: Large)   Pulse 76   Temp 98.3 ?F (36.8 ?C) (Temporal)   Resp 19   Ht '5\' 4"'$  (1.626 m)   Wt 225 lb (102.1 kg)   LMP 04/29/1997   SpO2 98%   BMI 38.62 kg/m?  ?Wt Readings from Last 3 Encounters:  ?06/02/21 225 lb (102.1 kg)  ?02/03/21 224 lb 9.6 oz (101.9 kg)  ?10/21/20 226 lb (102.5 kg)  ? ? ?Physical Exam ?Vitals reviewed.  ?Constitutional:   ?   General: She is not in acute distress. ?   Appearance: Normal appearance.  ?HENT:  ?   Head: Normocephalic and atraumatic.  ?   Right Ear: External ear normal.  ?   Left Ear: External ear normal.  ?Eyes:  ?   General: No scleral icterus.    ?   Right eye: No discharge.     ?   Left eye: No discharge.  ?   Conjunctiva/sclera: Conjunctivae normal.  ?Neck:  ?   Thyroid: No thyromegaly.  ?Cardiovascular:  ?   Rate and Rhythm: Normal rate and regular rhythm.  ?Pulmonary:  ?   Effort: No respiratory distress.  ?   Breath sounds: Normal breath sounds. No wheezing.  ?Abdominal:  ?   General: Bowel sounds are normal.  ?   Palpations: Abdomen is soft.  ?   Tenderness: There is no abdominal tenderness.  ?Musculoskeletal:     ?   General: No swelling or tenderness.  ?   Cervical back: Neck supple. No tenderness.  ?Lymphadenopathy:  ?   Cervical: No cervical adenopathy.  ?Skin: ?   Findings: No erythema or rash.  ?Neurological:  ?   Mental Status: She is alert.  ?   Comments: Reproducible - minimal dizziness - when going from lying to sitting/standing.    ?Psychiatric:     ?   Mood and Affect: Mood normal.     ?   Behavior: Behavior normal.  ? ? ? ?Outpatient Encounter Medications  as of 06/02/2021  ?Medication Sig  ? acetaminophen (TYLENOL) 500 MG tablet Take 1,500 mg by mouth daily  as needed for headache (pain).  ? albuterol (ACCUNEB) 0.63 MG/3ML nebulizer solution Take 3 mLs (0.63 mg total) by nebulization every 6 (six) hours as needed for wheezing.  ? budesonide (PULMICORT) 0.5 MG/2ML nebulizer solution Take 2 mLs by nebulization 2 (two) times daily as needed (shortness of breath).  ? citalopram (CELEXA) 20 MG tablet TAKE 1 TABLET BY MOUTH DAILY  ? ELIQUIS 2.5 MG TABS tablet TAKE 1 TABLET (2.5 MG TOTAL) BY MOUTH 2 (TWO) TIMES DAILY.  ? losartan (COZAAR) 50 MG tablet TAKE 1 TABLET (50 MG TOTAL) BY MOUTH DAILY WITH LUNCH.  ? rosuvastatin (CRESTOR) 5 MG tablet TAKE 1 TABLET (5 MG TOTAL) BY MOUTH DAILY.  ? ?No facility-administered encounter medications on file as of 06/02/2021.  ?  ? ?Lab Results  ?Component Value Date  ? WBC 9.2 06/02/2021  ? HGB 13.9 06/02/2021  ? HCT 42.5 06/02/2021  ? PLT 266.0 06/02/2021  ? GLUCOSE 92 06/02/2021  ? CHOL 159 06/02/2021  ? TRIG 179.0 (H) 06/02/2021  ? HDL 47.40 06/02/2021  ? LDLDIRECT 89.0 09/28/2020  ? [place] 76 06/02/2021  ? ALT 12 06/02/2021  ? AST 18 06/02/2021  ? NA 137 06/02/2021  ? K 4.3 06/02/2021  ? CL 101 06/02/2021  ? CREATININE 1.12 06/02/2021  ? BUN 13 06/02/2021  ? CO2 31 06/02/2021  ? TSH 2.12 04/04/2021  ? INR 1.2 04/23/2019  ? HGBA1C 5.8 06/02/2021  ? ? ?MM 3D SCREEN BREAST BILATERAL ? ?Result Date: 11/19/2020 ?CLINICAL DATA:  Screening. EXAM: DIGITAL SCREENING BILATERAL MAMMOGRAM WITH TOMOSYNTHESIS AND CAD TECHNIQUE: Bilateral screening digital craniocaudal and mediolateral oblique mammograms were obtained. Bilateral screening digital breast tomosynthesis was performed. The images were evaluated with computer-aided detection. COMPARISON:  Previous exam(s). ACR Breast Density Category b: There are scattered areas of fibroglandular density. FINDINGS: There are no findings suspicious for malignancy. IMPRESSION: No mammographic evidence of  malignancy. A result letter of this screening mammogram will be mailed directly to the patient. RECOMMENDATION: Screening mammogram in one year. (Code:SM-B-01Y) BI-RADS CATEGORY  1: Negative. Electronically Signed

## 2021-06-06 ENCOUNTER — Encounter: Payer: Self-pay | Admitting: Internal Medicine

## 2021-06-06 ENCOUNTER — Telehealth: Payer: Self-pay

## 2021-06-06 NOTE — Assessment & Plan Note (Signed)
Continue crestor.  Low cholesterol diet and exercise. Follow lipid panel and liver function tests.   

## 2021-06-06 NOTE — Assessment & Plan Note (Signed)
On eliquis.  Will contact hematology regarding stopping eliquis for colonoscopy.   ?

## 2021-06-06 NOTE — Assessment & Plan Note (Signed)
Continue eliquis as outlined.  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  Recommended indefinite anticoagulation.  D/w hematology regarding stopping.  ?

## 2021-06-06 NOTE — Telephone Encounter (Signed)
Tina Duarte called from [facility] Gastroenterology department to say she has sent five cardiac clearances to [place] since 05/01/2021, most recently on 06/03/2021, and she has not heard back from [place].  Tina Duarte said patient is scheduled for a colonoscopy on 06/15/2021, and she needs to know how many days to hold her Eliquis. ? ?[name]'s fax number:  [phone] ?

## 2021-06-06 NOTE — Assessment & Plan Note (Signed)
Reports breathing is stable.  No increased cough or congestion.  Follow.  ?

## 2021-06-06 NOTE — Assessment & Plan Note (Signed)
Low carb diet and exercise.  Follow met b and a1c.  ?

## 2021-06-06 NOTE — Assessment & Plan Note (Signed)
Continue losartan.  Blood pressure has been doing well.  Follow pressures.  Follow metabolic panel.  

## 2021-06-06 NOTE — Assessment & Plan Note (Signed)
Dizziness as outlined.  Acute onset 05/30/21.  Appears to be c/w vertigo.  Describes previus room spinning and now feeling as if things are moving.  No headache.  No acute neurological abnormality noted on exam.  Blood pressure ok.  No chest pain or increased heart rate/palpitations.  EKG - SR with no acute ischemic changes.  Has a history of vertigo.  Feels similar,but lasting.  She is feeling better today.  Steroid nasal spray.  Slow position changes and movement.  Check routine labs.  Discussed ENT evaluation.  Is feeling better.  Call with update tomorrow.  Discussed possible scanning if symptoms persists.  ?

## 2021-06-06 NOTE — Assessment & Plan Note (Signed)
Father's side of family with history of colon cancer.  Saw GI.  Is scheduled for colonoscopy later this month.  Will need to get the acute issues with dizziness resolved.  Also, will d/w hematology regarding stopping eliquis.  EKG as outlined.  Reports breathing stable.  ?

## 2021-06-06 NOTE — Telephone Encounter (Signed)
Gastro office was advised on 4/26 that pt was going to be seen on 5/4 for clearance. ?Pt was seen 5/4 for clearance appointment. ?Dr Nicki Reaper currently filling out paperwork. ?Gastro office advised again. ?

## 2021-06-07 ENCOUNTER — Other Ambulatory Visit: Payer: Self-pay

## 2021-06-07 MED ORDER — ROSUVASTATIN CALCIUM 10 MG PO TABS
10.0000 mg | ORAL_TABLET | Freq: Every day | ORAL | 3 refills | Status: DC
Start: 2021-06-07 — End: 2021-09-22

## 2021-06-07 NOTE — Telephone Encounter (Signed)
Tina Duarte called from [facility] Gastroenterology department to let us know that she has not received the cardiac clearance for patient yet.  Tina Duarte asked that we please fax it to her at [phone]. ?

## 2021-06-07 NOTE — Telephone Encounter (Signed)
S/w Noreene Larsson - advised pt w/ acute issues currently and needs to be worked up before clearance is signed. ?As of right now, the procedure does not need to be cancelled but it is possible. ?Dr Nicki Reaper is working on it. ? ?Noreene Larsson also advised that I have spoken to [place] several times with updates on patient. ?Noreene Larsson apologized and stated messages have never made it to her. ?

## 2021-06-09 DIAGNOSIS — J449 Chronic obstructive pulmonary disease, unspecified: Secondary | ICD-10-CM | POA: Diagnosis not present

## 2021-06-14 ENCOUNTER — Telehealth: Payer: Self-pay | Admitting: Internal Medicine

## 2021-06-14 DIAGNOSIS — J449 Chronic obstructive pulmonary disease, unspecified: Secondary | ICD-10-CM

## 2021-06-14 DIAGNOSIS — Z01818 Encounter for other preprocedural examination: Secondary | ICD-10-CM

## 2021-06-14 NOTE — Telephone Encounter (Signed)
Order placed for pulmonary referral.  Has seen Dr Mortimer Fries. Wants to see him again.  Needs f/u for her copd and needs pre op evaluation for colonoscopy.  ?

## 2021-06-15 ENCOUNTER — Telehealth: Payer: Self-pay | Admitting: Internal Medicine

## 2021-06-15 ENCOUNTER — Encounter: Admission: RE | Payer: Self-pay | Source: Home / Self Care

## 2021-06-15 ENCOUNTER — Ambulatory Visit: Admission: RE | Admit: 2021-06-15 | Payer: Medicare Other | Source: Home / Self Care | Admitting: Internal Medicine

## 2021-06-15 SURGERY — COLONOSCOPY
Anesthesia: General

## 2021-06-15 NOTE — Telephone Encounter (Signed)
Spoke with Dr. Nicki Reaper regarding holding Eliquis 3 days before the colonoscopy; and starting the day after the procedure if no bleeding noted.  Patient does not have to be bridged for the procedure. ?

## 2021-06-15 NOTE — Telephone Encounter (Signed)
-----   Message from Einar Pheasant, MD sent at 06/08/2021  3:48 PM EDT ----- ?Regarding: RE: stopping eliquis ?I had to leave yesterday.  It was late when I got home and saw your message.  Whenever you get a chance you can call me - [phone].  Thanks.  ? ?Charlene ?----- Message ----- ?From: Cammie Sickle, MD ?Sent: 06/07/2021   7:11 PM EDT ?To: Einar Pheasant, MD ?Subject: RE: stopping eliquis                          ? ?Thank you for reaching out to me. Please feel free to reach out to me at [phone] to discuss further. ?GB ?----- Message ----- ?From: Einar Pheasant, MD ?Sent: 06/07/2021   5:21 PM EDT ?To: Cammie Sickle, MD ?Subject: stopping eliquis                              ? ?Ms Cosens has a History of factor V Leiden -  history of multiple DVTs.  On eliquis.  Has a family history of colon cancer. GI contacted me about stopping eliquis.  I was going to talk to Ms Goodreau about possible risk of stopping the medication.  Is there anything else you recommend or would do differently.  Thank you for your help.  I really appreciate it.  ? ?Charlene ? ? ?

## 2021-06-17 ENCOUNTER — Other Ambulatory Visit: Payer: Self-pay | Admitting: Internal Medicine

## 2021-06-17 ENCOUNTER — Telehealth: Payer: Self-pay | Admitting: Internal Medicine

## 2021-06-17 NOTE — Telephone Encounter (Signed)
Pt need refill on ELIQUIS sent to [facility]

## 2021-06-20 ENCOUNTER — Other Ambulatory Visit: Payer: Self-pay

## 2021-06-20 MED ORDER — APIXABAN 2.5 MG PO TABS: 2.5000 mg | ORAL_TABLET | Freq: Two times a day (BID) | ORAL | 3 refills | Status: DC

## 2021-06-20 NOTE — Telephone Encounter (Signed)
sent 

## 2021-07-10 DIAGNOSIS — J449 Chronic obstructive pulmonary disease, unspecified: Secondary | ICD-10-CM | POA: Diagnosis not present

## 2021-07-25 ENCOUNTER — Telehealth (INDEPENDENT_AMBULATORY_CARE_PROVIDER_SITE_OTHER): Payer: Self-pay | Admitting: Family Medicine

## 2021-07-25 DIAGNOSIS — Z91199 Patient's noncompliance with other medical treatment and regimen due to unspecified reason: Secondary | ICD-10-CM

## 2021-07-25 NOTE — Progress Notes (Signed)
Tried to call patient for virtual visit but did not answer. I left her a message to call the office back but did not call back for visit.

## 2021-07-28 ENCOUNTER — Ambulatory Visit (INDEPENDENT_AMBULATORY_CARE_PROVIDER_SITE_OTHER): Payer: Medicare Other | Admitting: Internal Medicine

## 2021-07-28 ENCOUNTER — Encounter: Payer: Self-pay | Admitting: Internal Medicine

## 2021-07-28 VITALS — BP 128/82 | HR 82 | Temp 97.8°F | Ht 64.0 in | Wt 224.8 lb

## 2021-07-28 DIAGNOSIS — R059 Cough, unspecified: Secondary | ICD-10-CM

## 2021-07-28 DIAGNOSIS — Z1211 Encounter for screening for malignant neoplasm of colon: Secondary | ICD-10-CM | POA: Diagnosis not present

## 2021-07-28 DIAGNOSIS — I10 Essential (primary) hypertension: Secondary | ICD-10-CM | POA: Diagnosis not present

## 2021-07-28 DIAGNOSIS — E7800 Pure hypercholesterolemia, unspecified: Secondary | ICD-10-CM

## 2021-07-28 DIAGNOSIS — J4520 Mild intermittent asthma, uncomplicated: Secondary | ICD-10-CM | POA: Diagnosis not present

## 2021-07-28 DIAGNOSIS — J9611 Chronic respiratory failure with hypoxia: Secondary | ICD-10-CM | POA: Diagnosis not present

## 2021-07-28 DIAGNOSIS — D6851 Activated protein C resistance: Secondary | ICD-10-CM

## 2021-07-28 DIAGNOSIS — D6859 Other primary thrombophilia: Secondary | ICD-10-CM | POA: Diagnosis not present

## 2021-07-28 DIAGNOSIS — R739 Hyperglycemia, unspecified: Secondary | ICD-10-CM

## 2021-07-28 DIAGNOSIS — J441 Chronic obstructive pulmonary disease with (acute) exacerbation: Secondary | ICD-10-CM

## 2021-07-28 MED ORDER — ALBUTEROL SULFATE 0.63 MG/3ML IN NEBU: 1.0000 | INHALATION_SOLUTION | Freq: Four times a day (QID) | RESPIRATORY_TRACT | 3 refills | Status: AC | PRN

## 2021-07-28 MED ORDER — ALBUTEROL SULFATE HFA 108 (90 BASE) MCG/ACT IN AERS: 2.0000 | INHALATION_SPRAY | Freq: Four times a day (QID) | RESPIRATORY_TRACT | 2 refills | Status: AC | PRN

## 2021-07-28 MED ORDER — CEFDINIR 300 MG PO CAPS: 300.0000 mg | ORAL_CAPSULE | Freq: Two times a day (BID) | ORAL | 0 refills | Status: DC

## 2021-07-28 MED ORDER — PREDNISONE 10 MG PO TABS: ORAL_TABLET | ORAL | 0 refills | Status: DC

## 2021-07-28 NOTE — Patient Instructions (Signed)
Continue mucinex  Can take delsym cough syrup as needed for cough.

## 2021-07-28 NOTE — Progress Notes (Signed)
[name], female   DOB: 1945/05/17, 76 y.o.   MRN: [id number]   Subjective:    [name], female    DOB: [birth year], 76 y.o.   MRN: [id number]   Patient here for a scheduled follow up.   Chief Complaint  Patient presents with   Sinusitis   Hypertension   Hyperlipidemia   .   HPI Presented for follow up appt. Presented with increased cough and congestion.  States symptoms started last week.  No sore throat.  Increased nasal congestion and sinus pressure.  Blowing colored mucus - brownish green.  Also with increased cough.  No chest pain.  Reports no increased sob.  States her breathing - she feels is at baseline.  Increased cough.  Some chest congestion.  Wheezing.  Taking mucinex and benadryl.  Using nebulizer.  Eating.  No vomiting.  Bowels stable.     Past Medical History:  Diagnosis Date   Allergy    Asthma    Chicken pox    COPD (chronic obstructive pulmonary disease) ([place])    History of blood clots    Hypercholesterolemia    Hypertension    Retroperitoneal hematoma 03/2019   Past Surgical History:  Procedure Laterality Date   blood clots  2008   TUBAL LIGATION     Family History  Problem Relation Age of Onset   Cancer Mother        Breast   Heart disease Mother    Stroke Mother    Hypertension Mother    Breast cancer Mother        14's   Heart disease Father    Stroke Father    Hypertension Father    Diabetes Father    Colon cancer Other        paternal cousin   Social History   Socioeconomic History   Marital status: Widowed    Spouse name: Not on file   Number of children: Not on file   Years of education: Not on file   Highest education level: Not on file  Occupational History   Not on file  Tobacco Use   Smoking status: Never   Smokeless tobacco: Never  Vaping Use   Vaping Use: Never used  Substance and Sexual Activity   Alcohol use: No    Alcohol/week: 0.0 standard drinks of alcohol   Drug use: No   Sexual  activity: Not on file  Other Topics Concern   Not on file  Social History Narrative   Lives in [place] with son.  never smoked; no alcohol. Used to work in [employer]    Social Determinants of Health   Financial Resource Strain: Low Risk  (12/28/2020)   Overall Financial Resource Strain (CARDIA)    Difficulty of Paying Living Expenses: Not very hard  Recent Concern: Financial Resource Strain - Medium Risk (11/02/2020)   Overall Financial Resource Strain (CARDIA)    Difficulty of Paying Living Expenses: Somewhat hard  Food Insecurity: No Food Insecurity (10/21/2020)   Hunger Vital Sign    Worried About Running Out of Food in the Last Year: Never true    Ran Out of Food in the Last Year: Never true  Transportation Needs: No Transportation Needs (10/21/2020)   PRAPARE - Hydrologist (Medical): No    Lack of Transportation (Non-Medical): No  Physical Activity: Unknown (08/19/2018)   Exercise Vital Sign    Days of Exercise per Week: 0 days  Minutes of Exercise per Session: Not on file  Stress: No Stress Concern Present (10/21/2020)   [place]    Feeling of Stress : Not at all  Social Connections: Unknown (10/21/2020)   Social Connection and Isolation Panel [NHANES]    Frequency of Communication with Friends and Family: More than three times a week    Frequency of Social Gatherings with Friends and Family: More than three times a week    Attends Religious Services: Not on Advertising copywriter or Organizations: Not on file    Attends Archivist Meetings: Not on file    Marital Status: Not on file     Review of Systems  Constitutional:  Negative for appetite change, fever and unexpected weight change.  HENT:  Positive for congestion, postnasal drip and sinus pressure.   Respiratory:  Positive for cough and wheezing. Negative for chest tightness.        She reports  no increased sob.   Cardiovascular:  Negative for chest pain, palpitations and leg swelling.  Gastrointestinal:  Negative for abdominal pain, diarrhea, nausea and vomiting.  Genitourinary:  Negative for difficulty urinating and dysuria.  Musculoskeletal:  Negative for joint swelling and myalgias.  Skin:  Negative for color change and rash.  Neurological:  Negative for dizziness and headaches.  Psychiatric/Behavioral:  Negative for agitation and dysphoric mood.        Objective:     BP 128/82 (BP Location: Left Arm, Patient Position: Sitting, Cuff Size: Large)   Pulse 82   Temp 97.8 F (36.6 C) (Temporal)   Ht _0  (1.626 m)   Wt 224 lb 12.8 oz (102 kg)   LMP 04/29/1997   SpO2 94%   BMI 38.59 kg/m  Wt Readings from Last 3 Encounters:  07/28/21 224 lb 12.8 oz (102 kg)  06/02/21 225 lb (102.1 kg)  02/03/21 224 lb 9.6 oz (101.9 kg)    Physical Exam Vitals reviewed.  Constitutional:      General: She is not in acute distress.    Appearance: Normal appearance.  HENT:     Head: Normocephalic and atraumatic.     Right Ear: External ear normal.     Left Ear: External ear normal.  Eyes:     General: No scleral icterus.       Right eye: No discharge.        Left eye: No discharge.     Conjunctiva/sclera: Conjunctivae normal.  Neck:     Thyroid: No thyromegaly.  Cardiovascular:     Rate and Rhythm: Normal rate and regular rhythm.  Pulmonary:     Breath sounds: Normal breath sounds.     Comments: Increased cough with increased deep breathing.  Increased air movement.  Lungs appear to be clear.  Abdominal:     General: Bowel sounds are normal.     Palpations: Abdomen is soft.     Tenderness: There is no abdominal tenderness.  Musculoskeletal:        General: No swelling or tenderness.     Cervical back: Neck supple. No tenderness.  Lymphadenopathy:     Cervical: No cervical adenopathy.  Skin:    Findings: No erythema or rash.  Neurological:     Mental Status: She is  alert.  Psychiatric:        Mood and Affect: Mood normal.        Behavior: Behavior normal.  Outpatient Encounter Medications as of 07/28/2021  Medication Sig   acetaminophen (TYLENOL) 500 MG tablet Take 1,500 mg by mouth daily as needed for headache (pain).   albuterol (VENTOLIN HFA) 108 (90 Base) MCG/ACT inhaler Inhale 2 puffs into the lungs every 6 (six) hours as needed for wheezing or shortness of breath.   apixaban (ELIQUIS) 2.5 MG TABS tablet Take 1 tablet (2.5 mg total) by mouth 2 (two) times daily.   budesonide (PULMICORT) 0.5 MG/2ML nebulizer solution Take 2 mLs by nebulization 2 (two) times daily as needed (shortness of breath).   cefdinir (OMNICEF) 300 MG capsule Take 1 capsule (300 mg total) by mouth 2 (two) times daily.   citalopram (CELEXA) 20 MG tablet TAKE 1 TABLET BY MOUTH DAILY   losartan (COZAAR) 50 MG tablet TAKE 1 TABLET (50 MG TOTAL) BY MOUTH DAILY WITH LUNCH.   predniSONE (DELTASONE) 10 MG tablet Take 6 tablets x 1 day and then decrease by 1/2 tablet per day until down to zero mg.   rosuvastatin (CRESTOR) 10 MG tablet Take 1 tablet (10 mg total) by mouth daily.   [DISCONTINUED] albuterol (ACCUNEB) 0.63 MG/3ML nebulizer solution Take 3 mLs (0.63 mg total) by nebulization every 6 (six) hours as needed for wheezing.   albuterol (ACCUNEB) 0.63 MG/3ML nebulizer solution Take 3 mLs (0.63 mg total) by nebulization every 6 (six) hours as needed for wheezing.   No facility-administered encounter medications on file as of 07/28/2021.     Lab Results  Component Value Date   WBC 9.2 06/02/2021   HGB 13.9 06/02/2021   HCT 42.5 06/02/2021   PLT 266.0 06/02/2021   GLUCOSE 92 06/02/2021   CHOL 159 06/02/2021   TRIG 179.0 (H) 06/02/2021   HDL 47.40 06/02/2021   LDLDIRECT 89.0 09/28/2020   LDLCALC 76 06/02/2021   ALT 12 06/02/2021   AST 18 06/02/2021   NA 137 06/02/2021   K 4.3 06/02/2021   CL 101 06/02/2021   CREATININE 1.12 06/02/2021   BUN 13 06/02/2021   CO2 31  06/02/2021   TSH 2.12 04/04/2021   INR 1.2 04/23/2019   HGBA1C 5.8 06/02/2021    MM 3D SCREEN BREAST BILATERAL  Result Date: 11/19/2020 CLINICAL DATA:  Screening. EXAM: DIGITAL SCREENING BILATERAL MAMMOGRAM WITH TOMOSYNTHESIS AND CAD TECHNIQUE: Bilateral screening digital craniocaudal and mediolateral oblique mammograms were obtained. Bilateral screening digital breast tomosynthesis was performed. The images were evaluated with computer-aided detection. COMPARISON:  Previous exam(s). ACR Breast Density Category b: There are scattered areas of fibroglandular density. FINDINGS: There are no findings suspicious for malignancy. IMPRESSION: No mammographic evidence of malignancy. A result letter of this screening mammogram will be mailed directly to the patient. RECOMMENDATION: Screening mammogram in one year. (Code:SM-B-01Y) BI-RADS CATEGORY  1: Negative. Electronically Signed   By: Abelardo Diesel M.D.   On: 11/19/2020 11:25      Assessment & Plan:   Problem List Items Addressed This Visit     Asthma    Increased cough and congestion as outlined.  Treat current infection - omnicef as directed.  Prednisone taper.  Continue albuterol nebs.  Mucinex.  Nasal spray as directed.  Follow closely.  covid test obtained.  Discussed quarantine recommendation until results return.  Follow closely.       Relevant Medications   predniSONE (DELTASONE) 10 MG tablet   albuterol (ACCUNEB) 0.63 MG/3ML nebulizer solution   albuterol (VENTOLIN HFA) 108 (90 Base) MCG/ACT inhaler   Colon cancer screening    Father's side of family  with history of colon cancer.  Saw GI.  Was scheduled for colonoscopy last month.  Was put on hold until dizziness resolved.  Dizziness is better. Now with acute cough and congestion.  Treat infection as outlined.   Will need to get the acute issues with resolved.       Cough - Primary    Increased cough and congestion as outlined.  Treat current infection - omnicef as directed.   Prednisone taper.  Continue albuterol nebs.  Mucinex.  Nasal spray as directed.  Follow closely.  covid test obtained.  Discussed quarantine recommendation until results return.  Follow closely.       Relevant Orders   Novel Coronavirus, NAA ([facility]) (Completed)   Essential hypertension, benign    Continue losartan.  Blood pressure has been doing well.  Follow pressures.  Follow metabolic panel.       Factor V Leiden ([place])    On eliquis.  Discussed with hematology - ok to stop for colonoscopy.        Hypercholesterolemia    Continue crestor.  Low cholesterol diet and exercise.  Follow lipid panel and liver function tests.        Hyperglycemia    Low carb diet and exercise.  Follow met b and a1c.       Primary hypercoagulable state ([place])    Continue eliquis as outlined.  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  Recommended indefinite anticoagulation.        Respiratory failure with hypoxia ([place])    Using her oxygen at home.  Treat current infection.  Follow.        Other Visit Diagnoses     COPD with acute exacerbation ([place])       Relevant Medications   predniSONE (DELTASONE) 10 MG tablet   albuterol (ACCUNEB) 0.63 MG/3ML nebulizer solution   albuterol (VENTOLIN HFA) 108 (90 Base) MCG/ACT inhaler   Other Relevant Orders   Novel Coronavirus, NAA ([facility]) (Completed)        Einar Pheasant, MD

## 2021-07-30 ENCOUNTER — Encounter: Payer: Self-pay | Admitting: Internal Medicine

## 2021-07-30 LAB — NOVEL CORONAVIRUS, NAA: SARS-CoV-2, NAA: NOT DETECTED

## 2021-07-30 NOTE — Assessment & Plan Note (Signed)
On eliquis.  Discussed with hematology - ok to stop for colonoscopy.   

## 2021-07-30 NOTE — Assessment & Plan Note (Signed)
Continue crestor.  Low cholesterol diet and exercise. Follow lipid panel and liver function tests.   

## 2021-07-30 NOTE — Assessment & Plan Note (Signed)
Increased cough and congestion as outlined.  Treat current infection - omnicef as directed.  Prednisone taper.  Continue albuterol nebs.  Mucinex.  Nasal spray as directed.  Follow closely.  covid test obtained.  Discussed quarantine recommendation until results return.  Follow closely.

## 2021-07-30 NOTE — Assessment & Plan Note (Signed)
Low carb diet and exercise.  Follow met b and a1c.  

## 2021-07-30 NOTE — Assessment & Plan Note (Signed)
Continue losartan.  Blood pressure has been doing well.  Follow pressures.  Follow metabolic panel.

## 2021-07-30 NOTE — Assessment & Plan Note (Signed)
Using her oxygen at home.  Treat current infection.  Follow.

## 2021-07-30 NOTE — Assessment & Plan Note (Signed)
Continue eliquis as outlined.  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  Recommended indefinite anticoagulation.   

## 2021-07-30 NOTE — Assessment & Plan Note (Signed)
Father's side of family with history of colon cancer.  Saw GI.  Was scheduled for colonoscopy last month.  Was put on hold until dizziness resolved.  Dizziness is better. Now with acute cough and congestion.  Treat infection as outlined.   Will need to get the acute issues with resolved.

## 2021-08-09 DIAGNOSIS — J449 Chronic obstructive pulmonary disease, unspecified: Secondary | ICD-10-CM | POA: Diagnosis not present

## 2021-08-17 ENCOUNTER — Other Ambulatory Visit: Payer: Self-pay | Admitting: Internal Medicine

## 2021-08-25 ENCOUNTER — Ambulatory Visit: Payer: Medicare Other | Admitting: Internal Medicine

## 2021-08-25 ENCOUNTER — Encounter: Payer: Self-pay | Admitting: Internal Medicine

## 2021-08-25 DIAGNOSIS — J449 Chronic obstructive pulmonary disease, unspecified: Secondary | ICD-10-CM

## 2021-08-25 DIAGNOSIS — J441 Chronic obstructive pulmonary disease with (acute) exacerbation: Secondary | ICD-10-CM | POA: Insufficient documentation

## 2021-08-25 NOTE — Patient Instructions (Addendum)
Change your nebulizer to where you take it up to every 4 hours as needed if you can't catch your breath  Ok to try albuterol 15 min before an activity (on alternating days)  that you know would usually make you short of breath and see if it makes any difference and if makes none then don't take albuterol after activity unless you can't catch your breath as this means it's the resting that helps, not the albuterol.     Take the nebulizer right before you leave the house for your colonoscopy  You will need a chest xray prior to colonospy.  For drainage / throat tickle try take CHLORPHENIRAMINE  4 mg  ("Allergy Relief" '4mg'$   at [facility] should be easiest to find in the blue box usually on bottom shelf)  take one every 4 hours as needed - extremely effective and inexpensive over the counter- may cause drowsiness so start with just a dose or two an hour before bedtime and see how you tolerate it before trying in daytime.    If you start needing your nebulizer more than twice a day you will need to be seen here or Dr Bary Leriche office  Pulmonary clinic follow up is as needed

## 2021-08-25 NOTE — Progress Notes (Signed)
Tina Duarte, female    DOB: 17-Aug-1945   MRN: [id number]   Brief patient profile:  72  yowf never smoker with lots of secondary exp including home/work and wood fire exp for cooking and cleaning   with dx  GOLD 2 COPD  2020 by Kasa  referred to pulmonary clinic in [facility]  08/25/2021 by Dr Einar Pheasant      History of Present Illness  08/25/2021  Pulmonary/ 1st office eval/ Tina Duarte / [employer] / not really on any maint rx at baseline  Chief Complaint  Patient presents with   Consult    Has COPD, since 2021.  Wears 1L of oxygen at HS and as need.  Does not have portable oxygen.  Going to have have a colonoscopy, needs clearance.   Dyspnea:  limited by back pain  up slt hill back to house from car on her own and uses walker inside  Cough: flared up 07/21/21 while on no maint rx (wasfine for many months to sev years) saw PCP  07/28/21   sinusitis/ bronchitis dx >>>L rx  omnicef / pred and resumed alb 2-3 per day and doesn't think she needs it now/  Sleep: bed is flat wedge pillow / 1 lpm hs with pnds hs some better on benadry  SABA use: neb 3 h prior  No obvious day to day or daytime pattern/variability or assoc excess/ purulent sputum or mucus plugs or hemoptysis or cp or chest tightness, subjective wheeze or overt sinus or hb symptoms.   Sleeping  without nocturnal  or early am exacerbation  of respiratory  c/o's or need for noct saba. Also denies any obvious fluctuation of symptoms with weather or environmental changes or other aggravating or alleviating factors except as outlined above   No unusual exposure hx or h/o childhood pna/ asthma or knowledge of premature birth.  Current Allergies, Complete Past Medical History, Past Surgical History, Family History, and Social History were reviewed in [employer] record.  ROS  The following are not active complaints unless bolded Hoarseness, sore throat, dysphagia, dental problems, itching, sneezing,  nasal  congestion or discharge of excess mucus or purulent secretions, ear ache,   fever, chills, sweats, unintended wt loss or wt gain, classically pleuritic or exertional cp,  orthopnea pnd or arm/hand swelling  or leg swelling, presyncope, palpitations, abdominal pain, anorexia, nausea, vomiting, diarrhea  or change in bowel habits or change in bladder habits, change in stools or change in urine, dysuria, hematuria,  rash, arthralgias, visual complaints, headache, numbness, weakness or ataxia or problems with walking/ uses walker at home did not bring to office or coordination,  change in mood or  memory.             Past Medical History:  Diagnosis Date   Allergy    Asthma    Chicken pox    COPD (chronic obstructive pulmonary disease) ([place])    History of blood clots    Hypercholesterolemia    Hypertension    Retroperitoneal hematoma 03/2019    Outpatient Medications Prior to Visit  Medication Sig Dispense Refill   albuterol (ACCUNEB) 0.63 MG/3ML nebulizer solution Take 3 mLs (0.63 mg total) by nebulization every 6 (six) hours as needed for wheezing. 75 mL 3   albuterol (VENTOLIN HFA) 108 (90 Base) MCG/ACT inhaler Inhale 2 puffs into the lungs every 6 (six) hours as needed for wheezing or shortness of breath. 18 g 2   apixaban (ELIQUIS) 2.5  MG TABS tablet Take 1 tablet (2.5 mg total) by mouth 2 (two) times daily. 60 tablet 3   citalopram (CELEXA) 20 MG tablet TAKE 1 TABLET BY MOUTH DAILY 90 tablet 1   losartan (COZAAR) 50 MG tablet TAKE 1 TABLET DAILY WITH LUNCH 90 tablet 1   rosuvastatin (CRESTOR) 10 MG tablet Take 1 tablet (10 mg total) by mouth daily. 90 tablet 3   acetaminophen (TYLENOL) 500 MG tablet Take 1,500 mg by mouth daily as needed for headache (pain). (Patient not taking: Reported on 08/25/2021)                            Objective:     BP 110/60 (BP Location: Right Arm, Patient Position: Sitting, Cuff Size: Large)   Pulse 96   Temp 98.4 F (36.9 C) (Oral)   Ht '5\' 4"'$   (1.626 m)   Wt 226 lb 3.2 oz (102.6 kg)   LMP 04/29/1997   SpO2 92%   BMI 38.83 kg/m   SpO2: 92 % RA   Elderly wf nad in W/c bound due to back    HEENT : Oropharynx  clear  Nasal turbinates nl    NECK :  without  apparent JVD/ palpable Nodes/TM    LUNGS: no acc muscle use,  Min barrel  contour chest wall with bilateral  slightly decreased bs s audible wheeze and  without cough on insp or exp maneuvers and min  Hyperresonant  to  percussion bilaterally    CV:  RRR  no s3 or murmur or increase in P2, and no edema   ABD:  obese soft and nontender  limited excurssion  MS:   ext warm without deformities Or obvious joint restrictions  calf tenderness, cyanosis or clubbing     SKIN: warm and dry without lesions    NEURO:  alert, approp, nl sensorium with  no motor or cerebellar deficits apparent.        Cxr rec but wants to do at Dr Bary Leriche office     Assessment   COPD GOLD 2/ AB  Never smoker but heavy exp to cigs and indoor fires as child  - Spirometry 03/20/2018  FEV1 1.4 (65%)  Ratio 0.68  Min concavity   She is limited by her back and wt, not copd, and I believe she is better characterized as mostly restrictive changes on basis of obesity with intermittent mild AB flares and if these become a recurrent theme or needs saba neb more than twice daily should be changed over to BREO 100 or Symbicort 160 depending on insurance and ability to use hfa with neb pulmicort/lama an option though quite espensive and in this setting could continue to use neb alb/pulmicort as last resort.   She is cleared though for colonoscopy and f/u back here can be prn   Discussed in detail all the  indications, usual  risks and alternatives  relative to the benefits with patient who agrees to proceed with Rx as outlined.             Each maintenance medication was reviewed in detail including emphasizing most importantly the difference between maintenance and prns and under what circumstances the  prns are to be triggered using an action plan format where appropriate.  Total time for H and P, chart review, counseling, reviewing neb device(s) and generating customized AVS unique to this office visit / same day charting  > 45 min with pt  new to me           Christinia Gully, MD 08/25/2021

## 2021-08-26 ENCOUNTER — Encounter: Payer: Self-pay | Admitting: Internal Medicine

## 2021-08-26 NOTE — Assessment & Plan Note (Signed)
Never smoker but heavy exp to cigs and indoor fires as child  - Spirometry 03/20/2018  FEV1 1.4 (65%)  Ratio 0.68  Min concavity   She is limited by her back and wt, not copd, and I believe she is better characterized as mostly restrictive changes on basis of obesity with intermittent mild AB flares and if these become a recurrent theme or needs saba neb more than twice daily should be changed over to BREO 100 or Symbicort 160 depending on insurance and ability to use hfa with neb pulmicort/lama an option though quite espensive and in this setting could continue to use neb alb/pulmicort as last resort.   She is cleared though for colonoscopy and f/u back here can be prn   Discussed in detail all the  indications, usual  risks and alternatives  relative to the benefits with patient who agrees to proceed with Rx as outlined.             Each maintenance medication was reviewed in detail including emphasizing most importantly the difference between maintenance and prns and under what circumstances the prns are to be triggered using an action plan format where appropriate.  Total time for H and P, chart review, counseling, reviewing neb device(s) and generating customized AVS unique to this office visit / same day charting  > 45 min with pt new to me

## 2021-09-01 ENCOUNTER — Ambulatory Visit (INDEPENDENT_AMBULATORY_CARE_PROVIDER_SITE_OTHER): Payer: Medicare Other

## 2021-09-01 ENCOUNTER — Encounter: Payer: Self-pay | Admitting: Internal Medicine

## 2021-09-01 ENCOUNTER — Ambulatory Visit (INDEPENDENT_AMBULATORY_CARE_PROVIDER_SITE_OTHER): Payer: Medicare Other | Admitting: Internal Medicine

## 2021-09-01 VITALS — BP 130/70 | HR 70 | Temp 98.3°F | Resp 16 | Ht 64.0 in | Wt 224.2 lb

## 2021-09-01 DIAGNOSIS — R739 Hyperglycemia, unspecified: Secondary | ICD-10-CM | POA: Diagnosis not present

## 2021-09-01 DIAGNOSIS — R42 Dizziness and giddiness: Secondary | ICD-10-CM | POA: Diagnosis not present

## 2021-09-01 DIAGNOSIS — D6859 Other primary thrombophilia: Secondary | ICD-10-CM

## 2021-09-01 DIAGNOSIS — J9811 Atelectasis: Secondary | ICD-10-CM | POA: Diagnosis not present

## 2021-09-01 DIAGNOSIS — D6851 Activated protein C resistance: Secondary | ICD-10-CM | POA: Diagnosis not present

## 2021-09-01 DIAGNOSIS — J449 Chronic obstructive pulmonary disease, unspecified: Secondary | ICD-10-CM

## 2021-09-01 DIAGNOSIS — Z1211 Encounter for screening for malignant neoplasm of colon: Secondary | ICD-10-CM

## 2021-09-01 DIAGNOSIS — E7800 Pure hypercholesterolemia, unspecified: Secondary | ICD-10-CM

## 2021-09-01 DIAGNOSIS — M5442 Lumbago with sciatica, left side: Secondary | ICD-10-CM

## 2021-09-01 DIAGNOSIS — J9611 Chronic respiratory failure with hypoxia: Secondary | ICD-10-CM | POA: Diagnosis not present

## 2021-09-01 DIAGNOSIS — I10 Essential (primary) hypertension: Secondary | ICD-10-CM

## 2021-09-01 NOTE — Progress Notes (Signed)
Patient ID: [name], female   DOB: Apr 03, 1945, 76 y.o.   MRN: [id number]   Subjective:    [name], female    DOB: [birth year], 76 y.o.   MRN: [id number]   Patient here for a scheduled follow up .   HPI Here to follow up regarding her breathing, blood pressure and cholesterol.  Last visit was having issues with dizziness and congestion.  Treated for bronchitis.  Saw pulmonary.  Note reviewed.  Breathing better.  No increased cough or congestion.  She is using her nebulizer daily - q day.  Not needing rescue inhaler.  Overall feels better.  No chest pain.  Dizziness resolved.  No abdominal pain.  Occasional muscle cramps - discussed staying hydrated.  Bowels moving.  Increased back pain.  Limits activity.  Request referral back to pain clinic.    Past Medical History:  Diagnosis Date   Allergy    Asthma    Chicken pox    COPD (chronic obstructive pulmonary disease) ([place])    History of blood clots    Hypercholesterolemia    Hypertension    Retroperitoneal hematoma 03/2019   Past Surgical History:  Procedure Laterality Date   blood clots  2008   TUBAL LIGATION     Family History  Problem Relation Age of Onset   Cancer Mother        Breast   Heart disease Mother    Stroke Mother    Hypertension Mother    Breast cancer Mother        52's   Heart disease Father    Stroke Father    Hypertension Father    Diabetes Father    Colon cancer Other        paternal cousin   Social History   Socioeconomic History   Marital status: Widowed    Spouse name: Not on file   Number of children: Not on file   Years of education: Not on file   Highest education level: Not on file  Occupational History   Not on file  Tobacco Use   Smoking status: Never    Passive exposure: Past   Smokeless tobacco: Never   Tobacco comments:    Exposed to chemical and 2nd had smoke.  Vaping Use   Vaping Use: Never used  Substance and Sexual Activity   Alcohol use: No     Alcohol/week: 0.0 standard drinks of alcohol   Drug use: No   Sexual activity: Not on file  Other Topics Concern   Not on file  Social History Narrative   Lives in [place] with son.  never smoked; no alcohol. Used to work in [employer]    Social Determinants of Health   Financial Resource Strain: Low Risk  (12/28/2020)   Overall Financial Resource Strain (CARDIA)    Difficulty of Paying Living Expenses: Not very hard  Recent Concern: Financial Resource Strain - Medium Risk (11/02/2020)   Overall Financial Resource Strain (CARDIA)    Difficulty of Paying Living Expenses: Somewhat hard  Food Insecurity: No Food Insecurity (10/21/2020)   Hunger Vital Sign    Worried About Running Out of Food in the Last Year: Never true    Ran Out of Food in the Last Year: Never true  Transportation Needs: No Transportation Needs (10/21/2020)   PRAPARE - Hydrologist (Medical): No    Lack of Transportation (Non-Medical): No  Physical Activity: Unknown (08/19/2018)   Exercise Vital  Sign    Days of Exercise per Week: 0 days    Minutes of Exercise per Session: Not on file  Stress: No Stress Concern Present (10/21/2020)   [place]    Feeling of Stress : Not at all  Social Connections: Unknown (10/21/2020)   Social Connection and Isolation Panel [NHANES]    Frequency of Communication with Friends and Family: More than three times a week    Frequency of Social Gatherings with Friends and Family: More than three times a week    Attends Religious Services: Not on Advertising copywriter or Organizations: Not on file    Attends Archivist Meetings: Not on file    Marital Status: Not on file     Review of Systems  Constitutional:  Negative for appetite change and unexpected weight change.  HENT:  Negative for congestion and sinus pressure.   Respiratory:  Negative for cough and chest  tightness.        Breathing stable.   Cardiovascular:  Negative for chest pain and palpitations.       No increased leg swelling  Gastrointestinal:  Negative for abdominal pain, diarrhea, nausea and vomiting.  Genitourinary:  Negative for difficulty urinating and dysuria.  Musculoskeletal:  Positive for back pain. Negative for joint swelling and myalgias.  Skin:  Negative for color change and rash.  Neurological:  Negative for dizziness, light-headedness and headaches.  Psychiatric/Behavioral:  Negative for agitation and dysphoric mood.        Objective:     BP 130/70 (BP Location: Left Arm, Patient Position: Sitting, Cuff Size: Large)   Pulse 70   Temp 98.3 F (36.8 C) (Temporal)   Resp 16   Ht 5' 4" (1.626 m)   Wt 224 lb 3.2 oz (101.7 kg)   LMP 04/29/1997   SpO2 95%   BMI 38.48 kg/m  Wt Readings from Last 3 Encounters:  09/01/21 224 lb 3.2 oz (101.7 kg)  08/25/21 226 lb 3.2 oz (102.6 kg)  07/28/21 224 lb 12.8 oz (102 kg)    Physical Exam Vitals reviewed.  Constitutional:      General: She is not in acute distress.    Appearance: Normal appearance.  HENT:     Head: Normocephalic and atraumatic.     Right Ear: External ear normal.     Left Ear: External ear normal.  Eyes:     General: No scleral icterus.       Right eye: No discharge.        Left eye: No discharge.     Conjunctiva/sclera: Conjunctivae normal.  Neck:     Thyroid: No thyromegaly.  Cardiovascular:     Rate and Rhythm: Normal rate and regular rhythm.  Pulmonary:     Effort: No respiratory distress.     Breath sounds: Normal breath sounds. No wheezing.  Abdominal:     General: Bowel sounds are normal.     Palpations: Abdomen is soft.     Tenderness: There is no abdominal tenderness.  Musculoskeletal:        General: No swelling or tenderness.     Cervical back: Neck supple. No tenderness.  Lymphadenopathy:     Cervical: No cervical adenopathy.  Skin:    Findings: No erythema or rash.   Neurological:     Mental Status: She is alert.  Psychiatric:        Mood and Affect: Mood normal.  Behavior: Behavior normal.      Outpatient Encounter Medications as of 09/01/2021  Medication Sig   albuterol (ACCUNEB) 0.63 MG/3ML nebulizer solution Take 3 mLs (0.63 mg total) by nebulization every 6 (six) hours as needed for wheezing.   albuterol (VENTOLIN HFA) 108 (90 Base) MCG/ACT inhaler Inhale 2 puffs into the lungs every 6 (six) hours as needed for wheezing or shortness of breath.   apixaban (ELIQUIS) 2.5 MG TABS tablet Take 1 tablet (2.5 mg total) by mouth 2 (two) times daily.   citalopram (CELEXA) 20 MG tablet TAKE 1 TABLET BY MOUTH DAILY   losartan (COZAAR) 50 MG tablet TAKE 1 TABLET DAILY WITH LUNCH   rosuvastatin (CRESTOR) 10 MG tablet Take 1 tablet (10 mg total) by mouth daily.   No facility-administered encounter medications on file as of 09/01/2021.     Lab Results  Component Value Date   WBC 9.2 06/02/2021   HGB 13.9 06/02/2021   HCT 42.5 06/02/2021   PLT 266.0 06/02/2021   GLUCOSE 92 06/02/2021   CHOL 159 06/02/2021   TRIG 179.0 (H) 06/02/2021   HDL 47.40 06/02/2021   LDLDIRECT 89.0 09/28/2020   LDLCALC 76 06/02/2021   ALT 12 06/02/2021   AST 18 06/02/2021   NA 137 06/02/2021   K 4.3 06/02/2021   CL 101 06/02/2021   CREATININE 1.12 06/02/2021   BUN 13 06/02/2021   CO2 31 06/02/2021   TSH 2.12 04/04/2021   INR 1.2 04/23/2019   HGBA1C 5.8 06/02/2021    MM 3D SCREEN BREAST BILATERAL  Result Date: 11/19/2020 CLINICAL DATA:  Screening. EXAM: DIGITAL SCREENING BILATERAL MAMMOGRAM WITH TOMOSYNTHESIS AND CAD TECHNIQUE: Bilateral screening digital craniocaudal and mediolateral oblique mammograms were obtained. Bilateral screening digital breast tomosynthesis was performed. The images were evaluated with computer-aided detection. COMPARISON:  Previous exam(s). ACR Breast Density Category b: There are scattered areas of fibroglandular density. FINDINGS: There  are no findings suspicious for malignancy. IMPRESSION: No mammographic evidence of malignancy. A result letter of this screening mammogram will be mailed directly to the patient. RECOMMENDATION: Screening mammogram in one year. (Code:SM-B-01Y) BI-RADS CATEGORY  1: Negative. Electronically Signed   By: Abelardo Diesel M.D.   On: 11/19/2020 11:25      Assessment & Plan:   Problem List Items Addressed This Visit     Colon cancer screening    Father's side of family with history of colon cancer.  Saw GI.  Was scheduled for colonoscopy.  Was put on hold until dizziness resolved and acute infection cleared. Dizziness resolved.  Breathing better.  No increased cough or congestion. Pulmonary cleared.  Plan to schedule colonoscopy.       COPD GOLD 2/ AB     Saw pulmonary (Dr Melvyn Novas).  Feels most of her symptoms/issues - more restrictive changes.  See note.  She is using neb q day.  Not needing rescue inhaler.  No increased cough or congestion.  Feels better.  Follow.        Relevant Orders   DG Chest 2 View (Completed)   Dizziness    Resolved.       Essential hypertension, benign    Continue losartan.  Blood pressure has been doing well.  Follow pressures.  Follow metabolic panel.       Factor V Leiden ([place])    On eliquis.  Discussed with hematology - ok to stop for colonoscopy.        Hypercholesterolemia - Primary    Continue crestor.  Low cholesterol  diet and exercise.  Follow lipid panel and liver function tests.        Relevant Orders   Hepatic function panel   Basic metabolic panel   Lipid panel   Hyperglycemia    Low carb diet and exercise.  Follow met b and a1c.       Relevant Orders   Hemoglobin Y6T   Basic metabolic panel   Low back pain    Persistent increased pain.  Limiting activity.  Has seen pain clinic.  Request referral back.       Relevant Orders   Ambulatory referral to Pain Clinic   Primary hypercoagulable state ([place])    Continue eliquis as outlined.  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  Recommended indefinite anticoagulation.        Respiratory failure with hypoxia ([place])    Using her oxygen at home.  Follow.         Einar Pheasant, MD

## 2021-09-02 ENCOUNTER — Encounter: Payer: Self-pay | Admitting: Internal Medicine

## 2021-09-02 NOTE — Assessment & Plan Note (Signed)
Low carb diet and exercise.  Follow met b and a1c.  

## 2021-09-02 NOTE — Assessment & Plan Note (Signed)
Persistent increased pain.  Limiting activity.  Has seen pain clinic.  Request referral back.

## 2021-09-02 NOTE — Assessment & Plan Note (Signed)
Saw pulmonary (Dr Wert).  Feels most of her symptoms/issues - more restrictive changes.  See note.  She is using neb q day.  Not needing rescue inhaler.  No increased cough or congestion.  Feels better.  Follow.   

## 2021-09-02 NOTE — Assessment & Plan Note (Signed)
Continue crestor.  Low cholesterol diet and exercise. Follow lipid panel and liver function tests.   

## 2021-09-02 NOTE — Assessment & Plan Note (Signed)
Resolved

## 2021-09-02 NOTE — Assessment & Plan Note (Signed)
On eliquis.  Discussed with hematology - ok to stop for colonoscopy.   

## 2021-09-02 NOTE — Assessment & Plan Note (Signed)
Father's side of family with history of colon cancer.  Saw GI.  Was scheduled for colonoscopy.  Was put on hold until dizziness resolved and acute infection cleared. Dizziness resolved.  Breathing better.  No increased cough or congestion. Pulmonary cleared.  Plan to schedule colonoscopy.

## 2021-09-02 NOTE — Assessment & Plan Note (Signed)
Continue losartan.  Blood pressure has been doing well.  Follow pressures.  Follow metabolic panel.

## 2021-09-02 NOTE — Assessment & Plan Note (Signed)
Continue eliquis as outlined.  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  Recommended indefinite anticoagulation.   

## 2021-09-02 NOTE — Assessment & Plan Note (Signed)
Using her oxygen at home.  Follow.

## 2021-09-08 ENCOUNTER — Ambulatory Visit (INDEPENDENT_AMBULATORY_CARE_PROVIDER_SITE_OTHER): Payer: Medicare Other | Admitting: Family Medicine

## 2021-09-08 VITALS — Ht 64.0 in | Wt 225.0 lb

## 2021-09-08 DIAGNOSIS — Z0000 Encounter for general adult medical examination without abnormal findings: Secondary | ICD-10-CM | POA: Diagnosis not present

## 2021-09-08 NOTE — Patient Instructions (Signed)
CHECKLIST FOR A HEALTHY LIFE:  -Eat a healthy whole foods based diet, get regular physical activity, manage stress and engage in social connections - see below for specific suggestions and more information.  -Vaccines due:  -flu shot and covid booster in late summer or fall  -shingles vaccine   -tetanus booster  -Tests and screenings due:  -mammogram in October  -discuss bone density test with your primary care doctor at your physical  -Referrals:  -none  -See a dentist at least yearly  -Get your eyes checked (schedule and appointment asap) and then per your eye specialist's recommendations  -Other issues addressed today: -advanced directives - please see information below -healthy diet and staying active - see below  -Follow up: -as scheduled  for you lab work and your physical with your primary care doctor     FOOD - [place]: Food Prescription for you: -eat real food: lots of colorful vegetables (half the plate), small amounts of fresh fruits, fish, nuts, seeds, healthy oils (such as olive oil, avocado oil or organic grass fed unsalted butter if you need to eat butter), small portions of meat and small portions of whole grains -drink water -try to avoid fast food and pre-packaged foods  -try to avoid foods that contain any ingredients with names you do not recognize  -try to avoid sugar/sweets (except for the natural sugar that occurs in fresh fruit) -try to avoid sweet drinks -try to avoid white rice, white bread, pasta, white or yellow potatoes  MOVE - the key to keeping your body moving and working best: Exercise Prescription for you: -gradually increase intentional physical activity -move and  stretch your body, legs, feet and arms when sitting for long periods -try to get at least 20 minutes of sustained activity or two 10 minute episodes of sustained activity every day at minimum  [place] - so important for health and well being -try meditating, or just sitting quietly with deep breathing while intentionally relaxing all parts of your body for 5 minutes daily  SOCIAL CONNECTIONS: -options in [place] if you wish to engage in more social activities: -Hotel manager Classes (a variety of topics): see [email or web address] or call [phone] -consider volunteering at a school, hospice center, church, senior center or elsewhere  ADVANCED HEALTHCARE DIRECTIVES:  Everyone should have advanced health care directives in place. This is so that you get the care you want, should you ever be in a situation where you are unable to make your own medical decisions.   From the [place] Advanced Directive Website: "[place] are legal documents in which you give written instructions about your health care if, in the future, you cannot speak for yourself.   A health care power of attorney allows you to name a person you trust to make your health care decisions if you cannot make them yourself. A declaration of a desire for a natural death (or living will) is document, which states that you desire not to have your life prolonged by extraordinary measures if you have a terminal or incurable illness or if you are in a vegetative state. An advance instruction for mental health treatment makes a declaration of instructions, information and preferences regarding your mental health treatment. It also states that you are aware that the advance instruction authorizes a mental health treatment provider to act according to your wishes. It may also outline your consent or refusal of mental health treatment. A declaration of an anatomical  gift allows anyone over the age of 67 to make a  gift by will, organ donor card or other document."   Please see the following website or an elder law attorney for forms, FAQs and for completion of advanced directives: [place] Secretary of [place] ([email or web address])  Or copy and paste the following to your web browser: [email or web address]

## 2021-09-08 NOTE — Progress Notes (Signed)
PATIENT CHECK-IN and HEALTH RISK ASSESSMENT QUESTIONNAIRE:  -completed by phone/video for upcoming Medicare Preventive Visit  Pre-Visit Check-in: 1)Vitals (height, wt, BP, etc) - record in vitals section for visit on day of visit 2)Review and Update Medications, Allergies PMH, Surgeries, Social history in Epic 3)Hospitalizations in the last year with date/reason? none  4)Review and Update Care Team (patient's specialists) in Epic 5) Complete PHQ9 in Epic  6) Complete Fall Screening in Epic 7)Review all Health Maintenance Due and order under PCP if not done.  Has not get a shingle vaccine.  8)Medicare Wellness Questionnaire: Answer theses question about your habits: Do you drink alcohol? none How many drinks do you have a day?none Have you ever smoked?none Have you stopped smoking and date if applicable? N/a  How many packs a day do you smoke? N/a Do you use smokeless tobacco?none Do you use an illicit drugs?none Do you exercises? No - but she stays active, reports uses walker whenever she is up and about. IF so, what type and how many days/minutes per week?n/a Are you sexually active? No Number of partners?n/a  What did you eat for breakfast today (or yesterday)? fruit and nut bar Typical breakfast What did you eat for lunch today (or yesterday)? skip Typical lunch What did you eat for diner today (or yesterday)?half phili sub.pasta salad Typical dinner Typical snacks: no snacks What beverages do you drink besides water: crystalite lemonade sometimes  Answer theses question about you: Can you perform most household chores?can but takes forever - but feels great Do you find it hard to follow a conversation in a noisy room?no Do you find it hard to understand a speaker at church or in a meeting?no Do you often ask people to speak up or repeat themselves?yes. Sometime trouble on the phone. She does not want hearing aides at this point.  Do you experience ringing in your ears?yes Do  you have difficulty understanding a soft or whispered voice?sometimes Do you feel that you have a problem with memory? No, some times hard time remember people name who haven't seen for a while Do you often misplace items?no Do you balance your checkbook and or bank acounts?no, use debit card Do you feel safe at home?yes Last dentist visit?can't remember  Do you need assistance with any of the following:  Driving?yes, no car or license  Feeding yourself?no  Getting from bed to chair?no, use walker  Getting to the toilet?no  Bathing or showering?help from daughter  Dressing yourself?no  Managing money?no  Climbing a flight of stairs?yes  Preparing meals?don't cook. Son or daughter help with cooking.   Do you have Advanced Directives in place (Living Will, Healthcare Power or Attorney)? no   Last eye Exam and location?Don't remember. More than a year   Do you currently use prescribed or non-prescribed narcotic or opioid pain medications?no  Do you have a history or close family history of breast, ovarian, tubal or peritoneal cancer or a family member with BRCA (breast cancer susceptibility 1 and 2) gene mutations?  Not to her knowledge   Nurse/Assistant Credentials/time stamp: Encarnacion Slates, CMA [id number]   ----------------------------------------------------------------------------------------------------------------------------------------------------------------------------------------------------------------------   MEDICARE ANNUAL PREVENTIVE VISIT WITH PROVIDER: (Welcome to Medicare, initial annual wellness or annual wellness exam)  Virtual Visit via Video Note  I connected with Tina Duarte  on 09/08/2021 by a video enabled telemedicine application and verified that I am speaking with the correct person using two identifiers.  Location patient: home Location provider:work or home office Persons participating in the  virtual visit: patient, provider  Concerns and/or follow up  today: None, reports she feels great most of the time.   See HM section in Epic for other details of completed HM.    ROS: negative for report of fevers, unintentional weight loss, vision changes, vision loss, hearing loss or change, chest pain, sob, hemoptysis, melena, hematochezia, hematuria, genital discharge or lesions, falls, bleeding or bruising, loc, thoughts of suicide or [place] harm, memory loss  Patient-completed extensive health risk assessment - reviewed and discussed with the patient: See Health Risk Assessment completed with patient prior to the visit either above or in recent phone note. This was reviewed in detailed with the patient today and appropriate recommendations, orders and referrals were placed as needed per Summary below and patient instructions.   Review of Medical History: -PMH, PSH, Family History and current specialty and care providers reviewed and updated and listed below   Patient Care Team: Einar Pheasant, MD as PCP - General (Internal Medicine)   Past Medical History:  Diagnosis Date   Allergy    Asthma    Chicken pox    COPD (chronic obstructive pulmonary disease) ([place])    History of blood clots    Hypercholesterolemia    Hypertension    Retroperitoneal hematoma 03/2019    Past Surgical History:  Procedure Laterality Date   blood clots  2008   TUBAL LIGATION      Social History   Socioeconomic History   Marital status: Widowed    Spouse name: Not on file   Number of children: Not on file   Years of education: Not on file   Highest education level: Not on file  Occupational History   Not on file  Tobacco Use   Smoking status: Never    Passive exposure: Past   Smokeless tobacco: Never   Tobacco comments:    Exposed to chemical and 2nd had smoke.  Vaping Use   Vaping Use: Never used  Substance and Sexual Activity   Alcohol use: No    Alcohol/week: 0.0 standard drinks of alcohol   Drug use: No   Sexual activity: Not on file   Other Topics Concern   Not on file  Social History Narrative   Lives in [place] with son.  never smoked; no alcohol. Used to work in [employer]    Social Determinants of Health   Financial Resource Strain: Low Risk  (12/28/2020)   Overall Financial Resource Strain (CARDIA)    Difficulty of Paying Living Expenses: Not very hard  Recent Concern: Financial Resource Strain - Medium Risk (11/02/2020)   Overall Financial Resource Strain (CARDIA)    Difficulty of Paying Living Expenses: Somewhat hard  Food Insecurity: No Food Insecurity (10/21/2020)   Hunger Vital Sign    Worried About Running Out of Food in the Last Year: Never true    Ran Out of Food in the Last Year: Never true  Transportation Needs: No Transportation Needs (10/21/2020)   PRAPARE - Hydrologist (Medical): No    Lack of Transportation (Non-Medical): No  Physical Activity: Unknown (08/19/2018)   Exercise Vital Sign    Days of Exercise per Week: 0 days    Minutes of Exercise per Session: Not on file  Stress: No Stress Concern Present (10/21/2020)   [place]    Feeling of Stress : Not at all  Social Connections: Unknown (10/21/2020)   Social Connection and Isolation Panel [NHANES]  Frequency of Communication with Friends and Family: More than three times a week    Frequency of Social Gatherings with Friends and Family: More than three times a week    Attends Religious Services: Not on file    Active Member of Clubs or Organizations: Not on file    Attends Archivist Meetings: Not on file    Marital Status: Not on file  Intimate Partner Violence: Not At Risk (10/21/2020)   Humiliation, Afraid, Rape, and Kick questionnaire    Fear of Current or Ex-Partner: No    Emotionally Abused: No    Physically Abused: No    Sexually Abused: No    Family History  Problem Relation Age of Onset   Cancer Mother         Breast   Heart disease Mother    Stroke Mother    Hypertension Mother    Breast cancer Mother        89's   Heart disease Father    Stroke Father    Hypertension Father    Diabetes Father    Colon cancer Other        paternal cousin    Current Outpatient Medications on File Prior to Visit  Medication Sig Dispense Refill   albuterol (ACCUNEB) 0.63 MG/3ML nebulizer solution Take 3 mLs (0.63 mg total) by nebulization every 6 (six) hours as needed for wheezing. 75 mL 3   albuterol (VENTOLIN HFA) 108 (90 Base) MCG/ACT inhaler Inhale 2 puffs into the lungs every 6 (six) hours as needed for wheezing or shortness of breath. 18 g 2   apixaban (ELIQUIS) 2.5 MG TABS tablet Take 1 tablet (2.5 mg total) by mouth 2 (two) times daily. 60 tablet 3   citalopram (CELEXA) 20 MG tablet TAKE 1 TABLET BY MOUTH DAILY 90 tablet 1   losartan (COZAAR) 50 MG tablet TAKE 1 TABLET DAILY WITH LUNCH 90 tablet 1   rosuvastatin (CRESTOR) 10 MG tablet Take 1 tablet (10 mg total) by mouth daily. 90 tablet 3   No current facility-administered medications on file prior to visit.    No Known Allergies     Physical Exam There were no vitals filed for this visit. Estimated body mass index is 38.62 kg/m as calculated from the following:   Height as of this encounter: _0  (1.626 m).   Weight as of this encounter: 225 lb (102.1 kg).  EKG (optional): deferred due to virtual visit  GENERAL: alert, oriented, appears well and in no acute distress; visual acuity grossly intact, full vision exam deferred due to pandemic and/or virtual encounter  HEENT: atraumatic, conjunttiva clear, no obvious abnormalities on inspection of external nose and ears  NECK: normal movements of the head and neck  LUNGS: on inspection no signs of respiratory distress, breathing rate appears normal, no obvious gross SOB, gasping or wheezing  CV: no obvious cyanosis  MS: moves all visible extremities without noticeable  abnormality  PSYCH/NEURO: pleasant and cooperative, no obvious depression or anxiety, speech and thought processing grossly intact, Cognitive function grossly intact  Flowsheet Row Office Visit from 09/08/2021 in [facility]  PHQ-9 Total Score 1           09/08/2021   12:30 PM 09/01/2021    9:41 AM 07/28/2021    9:20 AM 10/21/2020    3:55 PM 09/28/2020   10:25 AM  Depression screen PHQ 2/9  Decreased Interest 0 1 1 0 0  Down, Depressed, Hopeless 0 0  1 0 0  PHQ - 2 Score 0 1 2 0 0  Altered sleeping 1 0 1    Tired, decreased energy 0 1 2    Change in appetite 0 0 0    Feeling bad or failure about yourself  0 0 0    Trouble concentrating 0 0 0    Moving slowly or fidgety/restless 0 0 0    Suicidal thoughts 0 0 0    PHQ-9 Score _0 Difficult doing work/chores   Somewhat difficult        05/27/2020   11:14 AM 10/21/2020    3:55 PM 07/28/2021    9:19 AM 09/01/2021    9:41 AM 09/08/2021   12:31 PM  Fall Risk  Falls in the past year? 0  0 0 0  Was there an injury with Fall?   0 0 0  Fall Risk Category Calculator   0 0 0  Fall Risk Category   Low Low Low  Patient Fall Risk Level High fall risk High fall risk High fall risk Low fall risk Low fall risk  Patient at Risk for Falls Due to   History of fall(s);Impaired balance/gait;Impaired mobility No Fall Risks No Fall Risks  Fall risk Follow up _1      SUMMARY AND PLAN:  Medicare annual wellness visit, subsequent    The following health maintenance/preventive care measures were recommended/discussed and the patient was referred if needed and if the patient agreeable:   Vaccines: flu, covid19 in the fall, tetanus booster at next visit, shingles vaccine   Mammogram: due in october  Colorectal cancer screening  utd  Osteoporosis screening if applicable: she had normal dexa in  2018 , she plans to discuss with PCP if further needed  Advised she should have eye exam.    Education and counseling on the following was provided based on the above review of health and a plan/checklist for the patient, along with additional information discussed, was provided for the patient in the patient instructions :  -Advised on importance of and resources for completing advanced directives -Provided counseling and plan for difficulty hearing discussed/referral to audiology or ENT if applicable per above screening. She does not want hearing aides at this time.  -Provided counseling and plan for increased risk of falling if applicable per above screening. (Referral for PT, community based exercise programs, etc.) She is using walker at all times.  -Provided counseling and plan for function difficulties/ difficulties with ADLs if applicable per above screening. -Advised and counseled on maintaining healthy weight and healthy lifestyle - including the importance of a health diet, regular physical activity, social connections and stress management. -Advised and counseled on a whole foods based healthy diet and regular exercise: discussed a heart healthy whole foods based diet at length. A summary of a healthy diet was provided in the Patient Instructions.. Recommended regular exercise and discussed options within the community.  -Advised yearly dental visits at minimum and regular eye exams   Follow up: see patient instructions     Patient Instructions  CHECKLIST FOR A HEALTHY LIFE:  -Eat a healthy whole foods based diet, get regular physical activity, manage stress and engage in social connections - see below for specific suggestions and more information.  -Vaccines due:  -flu shot and covid booster in late summer or fall  -shingles vaccine   -tetanus booster  -Tests  and screenings due:  -mammogram in October  -discuss bone density test with your primary care doctor at your  physical  -Referrals:  -none  -See a dentist at least yearly  -Get your eyes checked (schedule and appointment asap) and then per your eye specialist's recommendations  -Other issues addressed today: -advanced directives - please see information below -healthy diet and staying active - see below  -Follow up: -as scheduled  for you lab work and your physical with your primary care doctor  -----------------------------------------------------------------------------------------------------------------------------------------------------------------------------------------------------------------------------------------------------------   [place]: Food Prescription for you: -eat real food: lots of colorful vegetables (half the plate), small amounts of fresh fruits, fish, nuts, seeds, healthy oils (such as olive oil, avocado oil or organic grass fed unsalted butter if you need to eat butter), small portions of meat and small portions of whole grains -drink water -try to avoid fast food and pre-packaged foods  -try to avoid foods that contain any ingredients with names you do not recognize  -try to avoid sugar/sweets (except for the natural sugar that occurs in fresh fruit) -try to avoid sweet drinks -try to avoid white rice, white bread, pasta, white or yellow potatoes  MOVE - the key to keeping your body moving and working best: Exercise Prescription for you: -gradually increase intentional physical activity -move and stretch your body, legs, feet and arms when sitting for long periods -try to get at least 20 minutes of sustained activity or two 10 minute episodes of sustained activity every day at minimum  [place] - so important for health and well being -try meditating, or just sitting quietly with deep breathing while intentionally relaxing all parts of your body for 5 minutes daily  SOCIAL CONNECTIONS: -options in [place] if you  wish to engage in more social activities: -Hotel manager Classes (a variety of topics): see [email or web address] or call [phone] -consider volunteering at a school, hospice center, church, senior center or elsewhere  ADVANCED HEALTHCARE DIRECTIVES:  Everyone should have advanced health care directives in place. This is so that you get the care you want, should you ever be in a situation where you are unable to make your own medical decisions.   From the [place] Advanced Directive Website: "[place] are legal documents in which you give written instructions about your health care if, in the future, you cannot speak for yourself.   A health care power of attorney allows you to name a person you trust to make your health care decisions if you cannot make them yourself. A declaration of a desire for a natural death (or living will) is document, which states that you desire not to have your life prolonged by extraordinary measures if you have a terminal or incurable illness or if you are in a vegetative state. An advance instruction for mental health treatment makes a declaration of instructions, information and preferences regarding your mental health treatment. It also states that you are aware that the advance instruction authorizes a mental health treatment provider to act according to your wishes. It may also outline your consent or refusal of mental health treatment. A declaration of an anatomical gift allows anyone over the age of 65 to make a gift by will, organ donor card or other document."   Please see the following website or an elder law attorney for forms, FAQs and for completion of advanced directives: [place] Secretary of [place] ([email or web address])  Or copy and paste the following to your web browser: [email or web address]             Lucretia Kern, DO

## 2021-09-09 DIAGNOSIS — J449 Chronic obstructive pulmonary disease, unspecified: Secondary | ICD-10-CM | POA: Diagnosis not present

## 2021-09-11 ENCOUNTER — Encounter: Payer: Self-pay | Admitting: Internal Medicine

## 2021-09-11 DIAGNOSIS — I700 Atherosclerosis of aorta: Secondary | ICD-10-CM | POA: Insufficient documentation

## 2021-09-20 ENCOUNTER — Other Ambulatory Visit (INDEPENDENT_AMBULATORY_CARE_PROVIDER_SITE_OTHER): Payer: Medicare Other

## 2021-09-20 ENCOUNTER — Telehealth: Payer: Self-pay | Admitting: Internal Medicine

## 2021-09-20 DIAGNOSIS — E7800 Pure hypercholesterolemia, unspecified: Secondary | ICD-10-CM | POA: Diagnosis not present

## 2021-09-20 DIAGNOSIS — R739 Hyperglycemia, unspecified: Secondary | ICD-10-CM | POA: Diagnosis not present

## 2021-09-20 LAB — HEPATIC FUNCTION PANEL
ALT: 11 U/L (ref 0–35)
AST: 16 U/L (ref 0–37)
Albumin: 4.3 g/dL (ref 3.5–5.2)
Alkaline Phosphatase: 64 U/L (ref 39–117)
Bilirubin, Direct: 0.2 mg/dL (ref 0.0–0.3)
Total Bilirubin: 1.1 mg/dL (ref 0.2–1.2)
Total Protein: 6.8 g/dL (ref 6.0–8.3)

## 2021-09-20 LAB — BASIC METABOLIC PANEL
BUN: 22 mg/dL (ref 6–23)
CO2: 27 mEq/L (ref 19–32)
Calcium: 9.3 mg/dL (ref 8.4–10.5)
Chloride: 103 mEq/L (ref 96–112)
Creatinine, Ser: 1.04 mg/dL (ref 0.40–1.20)
GFR: 51.68 mL/min — ABNORMAL LOW (ref >60.00)
Glucose, Bld: 86 mg/dL (ref 70–99)
Potassium: 4.2 mEq/L (ref 3.5–5.1)
Sodium: 141 mEq/L (ref 135–145)

## 2021-09-20 LAB — LIPID PANEL
Cholesterol: 208 mg/dL — ABNORMAL HIGH (ref 0–200)
HDL: 49 mg/dL (ref >39.00)
LDL Cholesterol: 123 mg/dL — ABNORMAL HIGH (ref 0–99)
NonHDL: 159
Total CHOL/HDL Ratio: 4
Triglycerides: 179 mg/dL — ABNORMAL HIGH (ref 0.0–149.0)
VLDL: 35.8 mg/dL (ref 0.0–40.0)

## 2021-09-20 LAB — HEMOGLOBIN A1C: Hgb A1c MFr Bld: 6 % (ref 4.6–6.5)

## 2021-09-20 NOTE — Telephone Encounter (Signed)
Patient is requesting the following refill; rosuvastatin (CRESTOR) 10 MG tablet. [facility] [address] in [place]

## 2021-09-20 NOTE — Telephone Encounter (Signed)
Patient would also like her apixaban (ELIQUIS) 2.5 MG TABS tablet refilled.

## 2021-09-22 ENCOUNTER — Other Ambulatory Visit: Payer: Self-pay

## 2021-09-22 ENCOUNTER — Ambulatory Visit: Payer: Medicare Other | Attending: Anesthesiology | Admitting: Anesthesiology

## 2021-09-22 ENCOUNTER — Encounter: Payer: Self-pay | Admitting: Anesthesiology

## 2021-09-22 ENCOUNTER — Telehealth: Payer: Self-pay

## 2021-09-22 VITALS — BP 131/71 | HR 78 | Temp 98.1°F | Resp 16 | Ht 64.0 in | Wt 225.0 lb

## 2021-09-22 DIAGNOSIS — M47816 Spondylosis without myelopathy or radiculopathy, lumbar region: Secondary | ICD-10-CM

## 2021-09-22 DIAGNOSIS — M5136 Other intervertebral disc degeneration, lumbar region: Secondary | ICD-10-CM | POA: Diagnosis not present

## 2021-09-22 DIAGNOSIS — M1711 Unilateral primary osteoarthritis, right knee: Secondary | ICD-10-CM | POA: Diagnosis not present

## 2021-09-22 DIAGNOSIS — M51369 Other intervertebral disc degeneration, lumbar region without mention of lumbar back pain or lower extremity pain: Secondary | ICD-10-CM | POA: Insufficient documentation

## 2021-09-22 DIAGNOSIS — M48062 Spinal stenosis, lumbar region with neurogenic claudication: Secondary | ICD-10-CM

## 2021-09-22 DIAGNOSIS — M48061 Spinal stenosis, lumbar region without neurogenic claudication: Secondary | ICD-10-CM | POA: Insufficient documentation

## 2021-09-22 MED ORDER — ROSUVASTATIN CALCIUM 10 MG PO TABS: 10.0000 mg | ORAL_TABLET | Freq: Every day | ORAL | 3 refills | Status: DC

## 2021-09-22 NOTE — Patient Instructions (Signed)
Pain Management Discharge Instructions  General Discharge Instructions :  If you need to reach your doctor call: Monday-Friday 8:00 am - 4:00 pm at [phone] or toll free [phone].  After clinic hours [phone] to have operator reach doctor.  Bring all of your medication bottles to all your appointments in the pain clinic.  To cancel or reschedule your appointment with Pain Management please remember to call 24 hours in advance to avoid a fee.  Refer to the educational materials which you have been given on: General Risks, I had my Procedure. Discharge Instructions, Post Sedation.  Post Procedure Instructions:  The drugs you were given will stay in your system until tomorrow, so for the next 24 hours you should not drive, make any legal decisions or drink any alcoholic beverages.  You may eat anything you prefer, but it is better to start with liquids then soups and crackers, and gradually work up to solid foods.  Please notify your doctor immediately if you have any unusual bleeding, trouble breathing or pain that is not related to your normal pain.  Depending on the type of procedure that was done, some parts of your body may feel week and/or numb.  This usually clears up by tonight or the next day.  Walk with the use of an assistive device or accompanied by an adult for the 24 hours.  You may use ice on the affected area for the first 24 hours.  Put ice in a Ziploc bag and cover with a towel and place against area 15 minutes on 15 minutes off.  You may switch to heat after 24 hours.Epidural Steroid Injection Patient Information  Description: The epidural space surrounds the nerves as they exit the spinal cord.  In some patients, the nerves can be compressed and inflamed by a bulging disc or a tight spinal canal (spinal stenosis).  By injecting steroids into the epidural space, we can bring irritated nerves into direct contact with a potentially helpful medication.  These  steroids act directly on the irritated nerves and can reduce swelling and inflammation which often leads to decreased pain.  Epidural steroids may be injected anywhere along the spine and from the neck to the low back depending upon the location of your pain.   After numbing the skin with local anesthetic (like Novocaine), a small needle is passed into the epidural space slowly.  You may experience a sensation of pressure while this is being done.  The entire block usually last less than 10 minutes.  Conditions which may be treated by epidural steroids:  Low back and leg pain Neck and arm pain Spinal stenosis Post-laminectomy syndrome Herpes zoster (shingles) pain Pain from compression fractures  Preparation for the injection:  Do not eat any solid food or dairy products within 8 hours of your appointment.  You may drink clear liquids up to 3 hours before appointment.  Clear liquids include water, black coffee, juice or soda.  No milk or cream please. You may take your regular medication, including pain medications, with a sip of water before your appointment  Diabetics should hold regular insulin (if taken separately) and take 1/2 normal NPH dos the morning of the procedure.  Carry some sugar containing items with you to your appointment. A driver must accompany you and be prepared to drive you home after your procedure.  Bring all your current medications with your. An IV may be inserted and sedation may be given at the discretion of the physician.     A blood pressure cuff, EKG and other monitors will often be applied during the procedure.  Some patients may need to have extra oxygen administered for a short period. You will be asked to provide medical information, including your allergies, prior to the procedure.  We must know immediately if you are taking blood thinners (like Coumadin/Warfarin)  Or if you are allergic to IV iodine contrast (dye). We must know if you could possible be  pregnant.  Possible side-effects: Bleeding from needle site Infection (rare, may require surgery) Nerve injury (rare) Numbness & tingling (temporary) Difficulty urinating (rare, temporary) Spinal headache ( a headache worse with upright posture) Light -headedness (temporary) Pain at injection site (several days) Decreased blood pressure (temporary) Weakness in arm/leg (temporary) Pressure sensation in back/neck (temporary)  Call if you experience: Fever/chills associated with headache or increased back/neck pain. Headache worsened by an upright position. New onset weakness or numbness of an extremity below the injection site Hives or difficulty breathing (go to the emergency room) Inflammation or drainage at the infection site Severe back/neck pain Any new symptoms which are concerning to you  Please note:  Although the local anesthetic injected can often make your back or neck feel good for several hours after the injection, the pain will likely return.  It takes 3-7 days for steroids to work in the epidural space.  You may not notice any pain relief for at least that one week.  If effective, we will often do a series of three injections spaced 3-6 weeks apart to maximally decrease your pain.  After the initial series, we generally will wait several months before considering a repeat injection of the same type.  If you have any questions, please call [phone] [facility] 

## 2021-09-22 NOTE — Progress Notes (Signed)
Safety precautions to be maintained throughout the outpatient stay will include: orient to surroundings, keep bed in low position, maintain call bell within reach at all times, provide assistance with transfer out of bed and ambulation.  

## 2021-09-22 NOTE — Telephone Encounter (Signed)
Pt returning call

## 2021-09-22 NOTE — Telephone Encounter (Signed)
LMTCB for lab results.  

## 2021-09-22 NOTE — Progress Notes (Signed)
Subjective:  [name], female    DOB: 1946/01/09  Age: 76 y.o. MRN: [id number]  CC: Back Pain (Lumbar right side waste down )     PROCEDURE: None  HPI Tina Duarte presents for a new patient evaluation.  She has a longstanding history of low back pain and was originally seen in the pain clinic several years ago.  She has had a history of epidural steroid injections for her low back pain and has responded favorably to those in the distant past and reports today for reevaluation.  She is currently describing a gnawing aching pain in the low back that is quite incapacitating especially when she stands for any prolonged period of time.  When she does this she does experience weakness in her legs.  This gets better when she sits down.  She denies any recent MRI but maintains that she did have one several years ago showing evidence of degenerative disc disease and degeneration of the lumbar spine.  She reports good relief with epidural steroid injections.  The pain that she has now is present when she is standing sleeping or walking.  Its worse with bending standing or climbing stairs.  She does have occasional inability to control her bladder.  Her last MRI she reports was in 2022 though I cannot find this for review today.  Her maximum pain score is a 10 best a 0 and right now is 0 as she sits with me today.  She reports that the pain when present is cool disabling exhausting and quite uncomfortable.  Nothing much other conservative measures has helped.  She has done physical therapy exercises with no relief.   History Tina Duarte has a past medical history of Allergy, Asthma, Chicken pox, COPD (chronic obstructive pulmonary disease) ([place]), History of blood clots, Hypercholesterolemia, Hypertension, and Retroperitoneal hematoma (03/2019).   She has a past surgical history that includes blood clots (2008) and Tubal ligation.   Her family history includes Breast cancer in her mother; Cancer in  her mother; Colon cancer in an other family member; Diabetes in her father; Heart disease in her father and mother; Hypertension in her father and mother; Stroke in her father and mother.She reports that she has never smoked. She has been exposed to tobacco smoke. She has never used smokeless tobacco. She reports that she does not drink alcohol and does not use drugs.  No valid procedures specified.  No results found for: "TOXASSSELUR"  Outpatient Medications Prior to Visit  Medication Sig Dispense Refill   albuterol (ACCUNEB) 0.63 MG/3ML nebulizer solution Take 3 mLs (0.63 mg total) by nebulization every 6 (six) hours as needed for wheezing. 75 mL 3   albuterol (VENTOLIN HFA) 108 (90 Base) MCG/ACT inhaler Inhale 2 puffs into the lungs every 6 (six) hours as needed for wheezing or shortness of breath. 18 g 2   apixaban (ELIQUIS) 2.5 MG TABS tablet Take 1 tablet (2.5 mg total) by mouth 2 (two) times daily. 60 tablet 3   citalopram (CELEXA) 20 MG tablet TAKE 1 TABLET BY MOUTH DAILY 90 tablet 1   losartan (COZAAR) 50 MG tablet TAKE 1 TABLET DAILY WITH LUNCH 90 tablet 1   rosuvastatin (CRESTOR) 10 MG tablet Take 1 tablet (10 mg total) by mouth daily. 90 tablet 3   No facility-administered medications prior to visit.   Lab Results  Component Value Date   WBC 9.2 06/02/2021   HGB 13.9 06/02/2021   HCT 42.5 06/02/2021  PLT 266.0 06/02/2021   GLUCOSE 86 09/20/2021   CHOL 208 (H) 09/20/2021   TRIG 179.0 (H) 09/20/2021   HDL 49.00 09/20/2021   LDLDIRECT 89.0 09/28/2020   LDLCALC 123 (H) 09/20/2021   ALT 11 09/20/2021   AST 16 09/20/2021   NA 141 09/20/2021   K 4.2 09/20/2021   CL 103 09/20/2021   CREATININE 1.04 09/20/2021   BUN 22 09/20/2021   CO2 27 09/20/2021   TSH 2.12 04/04/2021   INR 1.2 04/23/2019   HGBA1C 6.0 09/20/2021    --------------------------------------------------------------------------------------------------------------------- MM 3D SCREEN BREAST  BILATERAL  Result Date: 11/19/2020 CLINICAL DATA:  Screening. EXAM: DIGITAL SCREENING BILATERAL MAMMOGRAM WITH TOMOSYNTHESIS AND CAD TECHNIQUE: Bilateral screening digital craniocaudal and mediolateral oblique mammograms were obtained. Bilateral screening digital breast tomosynthesis was performed. The images were evaluated with computer-aided detection. COMPARISON:  Previous exam(s). ACR Breast Density Category b: There are scattered areas of fibroglandular density. FINDINGS: There are no findings suspicious for malignancy. IMPRESSION: No mammographic evidence of malignancy. A result letter of this screening mammogram will be mailed directly to the patient. RECOMMENDATION: Screening mammogram in one year. (Code:SM-B-01Y) BI-RADS CATEGORY  1: Negative. Electronically Signed   By: Abelardo Diesel M.D.   On: 11/19/2020 11:25      ---------------------------------------------------------------------------------------------------------------------- Past Medical History:  Diagnosis Date   Allergy    Asthma    Chicken pox    COPD (chronic obstructive pulmonary disease) ([place])    History of blood clots    Hypercholesterolemia    Hypertension    Retroperitoneal hematoma 03/2019    Past Surgical History:  Procedure Laterality Date   blood clots  2008   TUBAL LIGATION      Family History  Problem Relation Age of Onset   Cancer Mother        Breast   Heart disease Mother    Stroke Mother    Hypertension Mother    Breast cancer Mother        62's   Heart disease Father    Stroke Father    Hypertension Father    Diabetes Father    Colon cancer Other        paternal cousin    Social History   Tobacco Use   Smoking status: Never    Passive exposure: Past   Smokeless tobacco: Never   Tobacco comments:    Exposed to chemical and 2nd had smoke.  Substance Use Topics   Alcohol use: No    Alcohol/week: 0.0 standard drinks of alcohol     ---------------------------------------------------------------------------------------------------------------------  Scheduled Meds: Continuous Infusions: PRN Meds:.   BP 131/71 (BP Location: Left Arm, Patient Position: Sitting, Cuff Size: Large)   Pulse 78   Temp 98.1 F (36.7 C) (Temporal)   Resp 16   Ht '5\' 4"'$  (1.626 m)   Wt 225 lb (102.1 kg)   LMP 04/29/1997   SpO2 98%   BMI 38.62 kg/m    BP Readings from Last 3 Encounters:  09/22/21 131/71  09/01/21 130/70  08/25/21 110/60     Wt Readings from Last 3 Encounters:  09/22/21 225 lb (102.1 kg)  09/08/21 225 lb (102.1 kg)  09/01/21 224 lb 3.2 oz (101.7 kg)     ----------------------------------------------------------------------------------------------------------------------  ROS Review of Systems Cardiac: No angina palpitations Pulmonary: No shortness of breath or dyspnea GI: No constipation or obstipation GU: History of dysuria Musculoskeletal: As above  Objective:  BP 131/71 (BP Location: Left Arm, Patient Position: Sitting, Cuff Size: Large)  Pulse 78   Temp 98.1 F (36.7 C) (Temporal)   Resp 16   Ht '5\' 4"'$  (1.626 m)   Wt 225 lb (102.1 kg)   LMP 04/29/1997   SpO2 98%   BMI 38.62 kg/m   Physical Exam Patient is alert oriented cooperative compliant and a good historian. Heart is regular rate and rhythm with distant heart sounds but regular no murmur Pulmonary: Clear to auscultation bilaterally no rales or wheezes Musculoskeletal reveals some paraspinous muscle tenderness in the lumbar spine.  She uses a wheelchair for ambulation and has difficulty going from seated to standing.  This does produce significant pain.  She is able to stand for examination.  She has paraspinous muscle tenderness and a questionable trigger point in the right lumbar paraspinous muscle.  She has difficulty with extension at the low back and this causes pain with left and right lateral rotation.  Her strength in the  lower extremities is intact but she has a questionable straight leg raise on the right side.  Negative on the left.  Muscle tone and bulk is good     Assessment & Plan:   Tina Duarte was seen today for back pain.  Diagnoses and all orders for this visit:  Primary osteoarthritis of right knee  DDD (degenerative disc disease), lumbar -     Lumbar Epidural Injection; Future  Spinal stenosis of lumbar region with neurogenic claudication -     Lumbar Epidural Injection; Future  Facet arthritis of lumbar region     ----------------------------------------------------------------------------------------------------------------------  Problem List Items Addressed This Visit       Unprioritized   DDD (degenerative disc disease), lumbar   Relevant Orders   Lumbar Epidural Injection   Facet arthritis of lumbar region   Spinal stenosis of lumbar region   Relevant Orders   Lumbar Epidural Injection   Other Visit Diagnoses     Primary osteoarthritis of right knee    -  Primary       ----------------------------------------------------------------------------------------------------------------------  1. Primary osteoarthritis of right knee   2. DDD (degenerative disc disease), lumbar Based on her history and previous radiographic studies of the gets reasonable to proceed with a repeat epidural steroid.  She would need to come off of her Eliquis in advance of this.  Will refer to the primary care physician for approval.  Once she has been off her Eliquis we will plan on doing an epidural steroid injection to see if this can help with her spinal stenosis and degenerative disc symptoms.  Unfortunately she has failed more conservative therapy.  We gone over the risks and benefits of the procedure with her in full detail all questions were answered. - Lumbar Epidural Injection; Future  3. Spinal stenosis of lumbar region with neurogenic claudication As above - Lumbar Epidural  Injection; Future  4. Facet arthritis of lumbar region Ultimately she may be a candidate for a bilateral diagnostic facet injection    ----------------------------------------------------------------------------------------------------------------------  I am having Tina Duarte. Tina Duarte maintain her citalopram, rosuvastatin, apixaban, albuterol, albuterol, and losartan.   No orders of the defined types were placed in this encounter.      Follow-up: Return in about 2 weeks (around 10/06/2021) for evaluation, procedure.    Molli Barrows, MD 10:35 AM  The [place] practitioner database for opioid medications on this patient has been reviewed by me and my staff   Greater than 50% of the total encounter time was spent in counseling and / or coordination of care.  This dictation was performed utilizing Systems analyst.  Please excuse any unintentional or mistaken typographical errors as a result.

## 2021-09-22 NOTE — Telephone Encounter (Signed)
See result note.  

## 2021-09-26 ENCOUNTER — Telehealth: Payer: Self-pay | Admitting: Internal Medicine

## 2021-09-26 ENCOUNTER — Other Ambulatory Visit: Payer: Self-pay

## 2021-09-26 MED ORDER — APIXABAN 2.5 MG PO TABS: 2.5000 mg | ORAL_TABLET | Freq: Two times a day (BID) | ORAL | 3 refills | Status: DC

## 2021-09-26 NOTE — Telephone Encounter (Signed)
Pt called and let her I sent in to [facility] [address].

## 2021-09-26 NOTE — Telephone Encounter (Signed)
Pt need refill on apixaban sent to [facility] [address]. Pt has changed pharmacies

## 2021-10-10 DIAGNOSIS — J449 Chronic obstructive pulmonary disease, unspecified: Secondary | ICD-10-CM | POA: Diagnosis not present

## 2021-10-11 ENCOUNTER — Telehealth: Payer: Self-pay

## 2021-10-11 NOTE — Telephone Encounter (Signed)
OK, I will wait, Just let me know when you get it. Thanks

## 2021-10-11 NOTE — Telephone Encounter (Signed)
No, [facility] faxed a request on 09-22-21, and I just sent a message in [place].

## 2021-10-11 NOTE — Telephone Encounter (Signed)
Do we have the approval for her to stop Eliquis for procedure? Per Dr. Andree Elk instructions on 8/24. I have the auth I just need to know before I call her about the blood thinners

## 2021-10-25 ENCOUNTER — Telehealth: Payer: Medicare Other | Admitting: Internal Medicine

## 2021-10-25 ENCOUNTER — Telehealth: Payer: Self-pay | Admitting: Internal Medicine

## 2021-10-25 NOTE — Telephone Encounter (Signed)
Please confirm no sob, chest pain or other acute symptoms.  If no, then can add on as virtual visit today.

## 2021-10-25 NOTE — Telephone Encounter (Signed)
Pt called in wanting to talk to the provider because she is sick. I asked her if she wanted to make an appointment to see another provider she stated she only wanted to see hers

## 2021-10-27 NOTE — Telephone Encounter (Signed)
I have tried calling the patient multiple times to schedule. She called back once and left a vm but when I tried to call her back her vm box was full.

## 2021-10-30 ENCOUNTER — Encounter: Payer: Self-pay | Admitting: Internal Medicine

## 2021-10-30 NOTE — Progress Notes (Signed)
Tried multiple times to reach Ms Mckeever for appt.  Unable to reach.

## 2021-11-09 DIAGNOSIS — J449 Chronic obstructive pulmonary disease, unspecified: Secondary | ICD-10-CM | POA: Diagnosis not present

## 2021-11-16 ENCOUNTER — Ambulatory Visit
Admission: RE | Admit: 2021-11-16 | Discharge: 2021-11-16 | Disposition: A | Discharge status: Home / Self Care | Payer: Medicare Other | Source: Ambulatory Visit | Attending: Anesthesiology | Admitting: Anesthesiology

## 2021-11-16 ENCOUNTER — Encounter: Payer: Self-pay | Admitting: Anesthesiology

## 2021-11-16 ENCOUNTER — Other Ambulatory Visit: Payer: Self-pay | Admitting: Anesthesiology

## 2021-11-16 ENCOUNTER — Ambulatory Visit (HOSPITAL_BASED_OUTPATIENT_CLINIC_OR_DEPARTMENT_OTHER): Payer: Medicare Other | Admitting: Anesthesiology

## 2021-11-16 VITALS — BP 161/65 | HR 78 | Temp 96.8°F | Resp 16 | Ht 64.0 in | Wt 220.0 lb

## 2021-11-16 DIAGNOSIS — M47816 Spondylosis without myelopathy or radiculopathy, lumbar region: Secondary | ICD-10-CM

## 2021-11-16 DIAGNOSIS — R52 Pain, unspecified: Secondary | ICD-10-CM | POA: Diagnosis not present

## 2021-11-16 DIAGNOSIS — M48062 Spinal stenosis, lumbar region with neurogenic claudication: Secondary | ICD-10-CM | POA: Diagnosis not present

## 2021-11-16 DIAGNOSIS — M1711 Unilateral primary osteoarthritis, right knee: Secondary | ICD-10-CM | POA: Insufficient documentation

## 2021-11-16 DIAGNOSIS — M5136 Other intervertebral disc degeneration, lumbar region: Secondary | ICD-10-CM | POA: Diagnosis not present

## 2021-11-16 MED ORDER — LIDOCAINE HCL (PF) 1 % IJ SOLN
INTRAMUSCULAR | Status: AC
  Filled 2021-11-16: qty 10

## 2021-11-16 MED ORDER — TRIAMCINOLONE ACETONIDE 40 MG/ML IJ SUSP
INTRAMUSCULAR | Status: AC
  Filled 2021-11-16: qty 1

## 2021-11-16 MED ORDER — TRIAMCINOLONE ACETONIDE 40 MG/ML IJ SUSP
40.0000 mg | Freq: Once | INTRAMUSCULAR | Status: AC
  Administered 2021-11-16: 40 mg

## 2021-11-16 MED ORDER — LIDOCAINE HCL (PF) 1 % IJ SOLN
5.0000 mL | Freq: Once | INTRAMUSCULAR | Status: AC
  Administered 2021-11-16: 5 mL via SUBCUTANEOUS

## 2021-11-16 MED ORDER — SODIUM CHLORIDE (PF) 0.9 % IJ SOLN
INTRAMUSCULAR | Status: AC
  Filled 2021-11-16: qty 10

## 2021-11-16 MED ORDER — IOHEXOL 180 MG/ML  SOLN
INTRAMUSCULAR | Status: AC
  Filled 2021-11-16: qty 20

## 2021-11-16 MED ORDER — ROPIVACAINE HCL 2 MG/ML IJ SOLN
10.0000 mL | Freq: Once | INTRAMUSCULAR | Status: AC
  Administered 2021-11-16: 1 mL via EPIDURAL

## 2021-11-16 MED ORDER — SODIUM CHLORIDE 0.9% FLUSH
10.0000 mL | Freq: Once | INTRAVENOUS | Status: AC
  Administered 2021-11-16: 10 mL

## 2021-11-16 MED ORDER — IOHEXOL 180 MG/ML  SOLN
10.0000 mL | Freq: Once | INTRAMUSCULAR | Status: AC | PRN
  Administered 2021-11-16: 10 mL via EPIDURAL

## 2021-11-16 MED ORDER — ROPIVACAINE HCL 2 MG/ML IJ SOLN
INTRAMUSCULAR | Status: AC
  Filled 2021-11-16: qty 20

## 2021-11-16 NOTE — Progress Notes (Signed)
Subjective:  [name], female    DOB: November 13, 1945  Age: 76 y.o. MRN: [id number]  CC: Back Pain (lower)   Procedure: L5-S1 epidural steroid and fluoroscopic guidance with no sedation  HPI Tina Duarte presents for reevaluation.  Tina Duarte is a patient well-known to me with persistent low back pain despite conservative treatment.  She has a history of spinal stenosis and describes a pain that is recurrent and chronic in nature and unremitting.  Despite conservative therapy and over-the-counter medication she is unable to get any significant relief.  Occasionally this pain will radiate from the low back down into the posterior buttocks and legs.  It is worse with prolonged standing.  She does get some mild weakness in the legs with prolonged standing as well.  She has had previous x-rays of the lumbar spine showing evidence of degenerative disc disease and facet arthropathy.  Based on previous discussion we are going to proceed per patient request with an epidural steroid injection today.  She has been off her Eliquis for 3 days.  Outpatient Medications Prior to Visit  Medication Sig Dispense Refill   albuterol (ACCUNEB) 0.63 MG/3ML nebulizer solution Take 3 mLs (0.63 mg total) by nebulization every 6 (six) hours as needed for wheezing. 75 mL 3   albuterol (VENTOLIN HFA) 108 (90 Base) MCG/ACT inhaler Inhale 2 puffs into the lungs every 6 (six) hours as needed for wheezing or shortness of breath. 18 g 2   apixaban (ELIQUIS) 2.5 MG TABS tablet Take 1 tablet (2.5 mg total) by mouth 2 (two) times daily. 60 tablet 3   citalopram (CELEXA) 20 MG tablet TAKE 1 TABLET BY MOUTH DAILY 90 tablet 1   losartan (COZAAR) 50 MG tablet TAKE 1 TABLET DAILY WITH LUNCH 90 tablet 1   rosuvastatin (CRESTOR) 10 MG tablet Take 1 tablet (10 mg total) by mouth daily. 90 tablet 3   No facility-administered medications prior to visit.    Review of Systems CNS: No confusion or sedation Cardiac: No angina or  palpitations GI: No abdominal pain or constipation Constitutional: No nausea vomiting fevers or chills  Objective:  BP (!) 161/65   Pulse 78   Temp (!) 96.8 F (36 C) (Temporal)   Resp 16   Ht '5\' 4"'$  (1.626 m)   Wt 220 lb (99.8 kg)   LMP 04/29/1997   SpO2 95%   BMI 37.76 kg/m    BP Readings from Last 3 Encounters:  11/16/21 (!) 161/65  09/22/21 131/71  09/01/21 130/70     Wt Readings from Last 3 Encounters:  11/16/21 220 lb (99.8 kg)  09/22/21 225 lb (102.1 kg)  09/08/21 225 lb (102.1 kg)     Physical Exam Pt is alert and oriented PERRL EOMI HEART IS RRR no murmur or rub LCTA no wheezing or rales MUSCULOSKELETAL reveals some paraspinous muscle tenderness but no overt trigger points.  Muscle tone and bulk is appropriate  Labs  Lab Results  Component Value Date   HGBA1C 6.0 09/20/2021   HGBA1C 5.8 06/02/2021   HGBA1C 5.8 02/03/2021   Lab Results  Component Value Date   LDLCALC 123 (H) 09/20/2021   CREATININE 1.04 09/20/2021    -------------------------------------------------------------------------------------------------------------------- Lab Results  Component Value Date   WBC 9.2 06/02/2021   HGB 13.9 06/02/2021   HCT 42.5 06/02/2021   PLT 266.0 06/02/2021   GLUCOSE 86 09/20/2021   CHOL 208 (H) 09/20/2021   TRIG 179.0 (H) 09/20/2021   HDL 49.00 09/20/2021  LDLDIRECT 89.0 09/28/2020   LDLCALC 123 (H) 09/20/2021   ALT 11 09/20/2021   AST 16 09/20/2021   NA 141 09/20/2021   K 4.2 09/20/2021   CL 103 09/20/2021   CREATININE 1.04 09/20/2021   BUN 22 09/20/2021   CO2 27 09/20/2021   TSH 2.12 04/04/2021   INR 1.2 04/23/2019   HGBA1C 6.0 09/20/2021    --------------------------------------------------------------------------------------------------------------------- DG PAIN CLINIC C-ARM 1-60 MIN NO REPORT  Result Date: 11/16/2021 Fluoro was used, but no Radiologist interpretation will be provided. Please refer to "NOTES" tab for provider  progress note.    Assessment & Plan:   Tina Duarte was seen today for back pain.  Diagnoses and all orders for this visit:  Primary osteoarthritis of right knee  DDD (degenerative disc disease), lumbar -     triamcinolone acetonide (KENALOG-40) injection 40 mg -     sodium chloride flush (NS) 0.9 % injection 10 mL -     ropivacaine (PF) 2 mg/mL (0.2%) (NAROPIN) injection 10 mL -     lidocaine (PF) (XYLOCAINE) 1 % injection 5 mL -     iohexol (OMNIPAQUE) 180 MG/ML injection 10 mL  Spinal stenosis of lumbar region with neurogenic claudication -     triamcinolone acetonide (KENALOG-40) injection 40 mg -     sodium chloride flush (NS) 0.9 % injection 10 mL -     ropivacaine (PF) 2 mg/mL (0.2%) (NAROPIN) injection 10 mL -     lidocaine (PF) (XYLOCAINE) 1 % injection 5 mL -     iohexol (OMNIPAQUE) 180 MG/ML injection 10 mL  Facet arthritis of lumbar region        ----------------------------------------------------------------------------------------------------------------------  Problem List Items Addressed This Visit       Unprioritized   DDD (degenerative disc disease), lumbar   Facet arthritis of lumbar region   Spinal stenosis of lumbar region   Other Visit Diagnoses     Primary osteoarthritis of right knee    -  Primary   Relevant Medications   triamcinolone acetonide (KENALOG-40) injection 40 mg (Completed)         ----------------------------------------------------------------------------------------------------------------------  1. Primary osteoarthritis of right knee   2. DDD (degenerative disc disease), lumbar Based on our discussion and per patient request we will proceed with an epidural steroid injection today.  We have gone over the risks and benefits of the procedure in full detail all questions were answered.  She may restart her Eliquis at the 12-hour mark.  We will schedule her for return to clinic in 1 month for possible repeat epidural  injection. - triamcinolone acetonide (KENALOG-40) injection 40 mg - sodium chloride flush (NS) 0.9 % injection 10 mL - ropivacaine (PF) 2 mg/mL (0.2%) (NAROPIN) injection 10 mL - lidocaine (PF) (XYLOCAINE) 1 % injection 5 mL - iohexol (OMNIPAQUE) 180 MG/ML injection 10 mL  3. Spinal stenosis of lumbar region with neurogenic claudication As above - triamcinolone acetonide (KENALOG-40) injection 40 mg - sodium chloride flush (NS) 0.9 % injection 10 mL - ropivacaine (PF) 2 mg/mL (0.2%) (NAROPIN) injection 10 mL - lidocaine (PF) (XYLOCAINE) 1 % injection 5 mL - iohexol (OMNIPAQUE) 180 MG/ML injection 10 mL  4. Facet arthritis of lumbar region Continue efforts at weight loss and stretching as tolerated.    ----------------------------------------------------------------------------------------------------------------------  I am having Tina Duarte maintain her citalopram, albuterol, albuterol, losartan, rosuvastatin, and apixaban. We administered triamcinolone acetonide, sodium chloride flush, ropivacaine (PF) 2 mg/mL (0.2%), lidocaine (PF), and iohexol.   Meds ordered this encounter  Medications   triamcinolone acetonide (KENALOG-40) injection 40 mg   sodium chloride flush (NS) 0.9 % injection 10 mL   ropivacaine (PF) 2 mg/mL (0.2%) (NAROPIN) injection 10 mL   lidocaine (PF) (XYLOCAINE) 1 % injection 5 mL   iohexol (OMNIPAQUE) 180 MG/ML injection 10 mL   Patient's Medications  New Prescriptions   No medications on file  Previous Medications   ALBUTEROL (ACCUNEB) 0.63 MG/3ML NEBULIZER SOLUTION    Take 3 mLs (0.63 mg total) by nebulization every 6 (six) hours as needed for wheezing.   ALBUTEROL (VENTOLIN HFA) 108 (90 BASE) MCG/ACT INHALER    Inhale 2 puffs into the lungs every 6 (six) hours as needed for wheezing or shortness of breath.   APIXABAN (ELIQUIS) 2.5 MG TABS TABLET    Take 1 tablet (2.5 mg total) by mouth 2 (two) times daily.   CITALOPRAM (CELEXA) 20 MG TABLET    TAKE 1  TABLET BY MOUTH DAILY   LOSARTAN (COZAAR) 50 MG TABLET    TAKE 1 TABLET DAILY WITH LUNCH   ROSUVASTATIN (CRESTOR) 10 MG TABLET    Take 1 tablet (10 mg total) by mouth daily.  Modified Medications   No medications on file  Discontinued Medications   No medications on file   ----------------------------------------------------------------------------------------------------------------------  Follow-up: Return in about 1 month (around 12/17/2021) for procedure, evaluation.   Procedure: L5-S1 LESI with fluoroscopic guidance and no moderate sedation  NOTE: The risks, benefits, and expectations of the procedure have been discussed and explained to the patient who was understanding and in agreement with suggested treatment plan. No guarantees were made.  DESCRIPTION OF PROCEDURE: Lumbar epidural steroid injection with no IV Versed, EKG, blood pressure, pulse, and pulse oximetry monitoring. The procedure was performed with the patient in the prone position under fluoroscopic guidance.  Sterile prep x3 was initiated and I then injected subcutaneous lidocaine to the overlying L5-S1 site after its fluoroscopic identifictation.  Using strict aseptic technique, I then advanced an 18-gauge Tuohy epidural needle in the midline using interlaminar approach via loss-of-resistance to saline technique. There was negative aspiration for heme or  CSF.  I then confirmed position with both AP and Lateral fluoroscan.  2 cc of contrast dye were injected and a  total of 5 mL of Preservative-Free normal saline mixed with 40 mg of Kenalog and 1cc Ropicaine 0.2 percent were injected incrementally via the  epidurally placed needle. The needle was removed. The patient tolerated the injection well and was convalesced and discharged to home in stable condition. Should the patient have any post procedure difficulty they have been instructed on how to contact us for assistance.    Molli Barrows, MD

## 2021-11-16 NOTE — Patient Instructions (Addendum)
______________________________________________________________________________________________  Specialty Pain Scale  Introduction:  There are significant differences in how pain is reported. The word pain usually refers to physical pain, but it is also a common synonym of suffering. The medical community uses a scale from 0 (zero) to 10 (ten) to report pain level. Zero (0) is described as "no pain", while ten (10) is described as "the worse pain you can imagine". The problem with this scale is that physical pain is reported along with suffering. Suffering refers to mental pain, or more often yet it refers to any unpleasant feeling, emotion or aversion associated with the perception of harm or threat of harm. It is the psychological component of pain.  Pain Specialists prefer to separate the two components. The pain scale used by this practice is the Verbal Numerical Rating Scale (VNRS-11). This scale is for the physical pain only. DO NOT INCLUDE how your pain psychologically affects you. This scale is for adults 21 years of age and older. It has 11 (eleven) levels. The 1st level is 0/10. This means: "right now, I have no pain". In the context of pain management, it also means: "right now, my physical pain is under control with the current therapy".  General Information:  The scale should reflect your current level of pain. Unless you are specifically asked for the level of your worst pain, or your average pain. If you are asked for one of these two, then it should be understood that it is over the past 24 hours.  Levels 1 (one) through 5 (five) are described below, and can be treated as an outpatient. Ambulatory pain management facilities such as ours are more than adequate to treat these levels. Levels 6 (six) through 10 (ten) are also described below, however, these must be treated as a hospitalized patient. While levels 6 (six) and 7 (seven) may be evaluated at an urgent care facility, levels 8  (eight) through 10 (ten) constitute medical emergencies and as such, they belong in a hospital's emergency department. When having these levels (as described below), do not come to our office. Our facility is not equipped to manage these levels. Go directly to an urgent care facility or an emergency department to be evaluated.  Definitions:  Activities of Daily Living (ADL): Activities of daily living (ADL or ADLs) is a term used in healthcare to refer to people's daily [place]-care activities. Health professionals often use a person's ability or inability to perform ADLs as a measurement of their functional status, particularly in regard to people post injury, with disabilities and the elderly. There are two ADL levels: Basic and Instrumental. Basic Activities of Daily Living (BADL  or BADLs) consist of [place]-care tasks that include: Bathing and showering; personal hygiene and grooming (including brushing/combing/styling hair); dressing; Toilet hygiene (getting to the toilet, cleaning oneself, and getting back up); eating and [place]-feeding (not including cooking or chewing and swallowing); functional mobility, often referred to as "transferring", as measured by the ability to walk, get in and out of bed, and get into and out of a chair; the broader definition (moving from one place to another while performing activities) is useful for people with different physical abilities who are still able to get around independently. Basic ADLs include the things many people do when they get up in the morning and get ready to go out of the house: get out of bed, go to the toilet, bathe, dress, groom, and eat. On the average, loss of function typically follows a particular order.   Hygiene is the first to go, followed by loss of toilet use and locomotion. The last to go is the ability to eat. When there is only one remaining area in which the person is independent, there is a 62.9% chance that it is eating and only a 3.5% chance  that it is hygiene. Instrumental Activities of Daily Living (IADL or IADLs) are not necessary for fundamental functioning, but they let an individual live independently in a community. IADL consist of tasks that include: cleaning and maintaining the house; home establishment and maintenance; care of others (including selecting and supervising caregivers); care of pets; child rearing; managing money; managing financials (investments, etc.); meal preparation and cleanup; shopping for groceries and necessities; moving within the community; safety procedures and emergency responses; health management and maintenance (taking prescribed medications); and using the telephone or other form of communication.  Instructions:  Most patients tend to report their pain as a combination of two factors, their physical pain and their psychosocial pain. This last one is also known as "suffering" and it is reflection of how physical pain affects you socially and psychologically. From now on, report them separately.  From this point on, when asked to report your pain level, report only your physical pain. Use the following table for reference.  Pain Clinic Pain Levels (0-5/10)  Pain Level Score  Description  No Pain 0   Mild pain 1 Nagging, annoying, but does not interfere with basic activities of daily living (ADL). Patients are able to eat, bathe, get dressed, toileting (being able to get on and off the toilet and perform personal hygiene functions), transfer (move in and out of bed or a chair without assistance), and maintain continence (able to control bladder and bowel functions). Blood pressure and heart rate are unaffected. A normal heart rate for a healthy adult ranges from 60 to 100 bpm (beats per minute).   Mild to moderate pain 2 Noticeable and distracting. Impossible to hide from other people. More frequent flare-ups. Still possible to adapt and function close to normal. It can be very annoying and may have  occasional stronger flare-ups. With discipline, patients may get used to it and adapt.   Moderate pain 3 Interferes significantly with activities of daily living (ADL). It becomes difficult to feed, bathe, get dressed, get on and off the toilet or to perform personal hygiene functions. Difficult to get in and out of bed or a chair without assistance. Very distracting. With effort, it can be ignored when deeply involved in activities.   Moderately severe pain 4 Impossible to ignore for more than a few minutes. With effort, patients may still be able to manage work or participate in some social activities. Very difficult to concentrate. Signs of autonomic nervous system discharge are evident: dilated pupils (mydriasis); mild sweating (diaphoresis); sleep interference. Heart rate becomes elevated (>115 bpm). Diastolic blood pressure (lower number) rises above 100 mmHg. Patients find relief in laying down and not moving.   Severe pain 5 Intense and extremely unpleasant. Associated with frowning face and frequent crying. Pain overwhelms the senses.  Ability to do any activity or maintain social relationships becomes significantly limited. Conversation becomes difficult. Pacing back and forth is common, as getting into a comfortable position is nearly impossible. Pain wakes you up from deep sleep. Physical signs will be obvious: pupillary dilation; increased sweating; goosebumps; brisk reflexes; cold, clammy hands and feet; nausea, vomiting or dry heaves; loss of appetite; significant sleep disturbance with inability to fall asleep or to   remain asleep. When persistent, significant weight loss is observed due to the complete loss of appetite and sleep deprivation.  Blood pressure and heart rate becomes significantly elevated. Caution: If elevated blood pressure triggers a pounding headache associated with blurred vision, then the patient should immediately seek attention at an urgent or emergency care unit, as  these may be signs of an impending stroke.    Emergency Department Pain Levels (6-10/10)  Emergency Room Pain 6 Severely limiting. Requires emergency care and should not be seen or managed at an outpatient pain management facility. Communication becomes difficult and requires great effort. Assistance to reach the emergency department may be required. Facial flushing and profuse sweating along with potentially dangerous increases in heart rate and blood pressure will be evident.   Distressing pain 7 [place]-care is very difficult. Assistance is required to transport, or use restroom. Assistance to reach the emergency department will be required. Tasks requiring coordination, such as bathing and getting dressed become very difficult.   Disabling pain 8 [place]-care is no longer possible. At this level, pain is disabling. The individual is unable to do even the most "basic" activities such as walking, eating, bathing, dressing, transferring to a bed, or toileting. Fine motor skills are lost. It is difficult to think clearly.   Incapacitating pain 9 Pain becomes incapacitating. Thought processing is no longer possible. Difficult to remember your own name. Control of movement and coordination are lost.   The worst pain imaginable 10 At this level, most patients pass out from pain. When this level is reached, collapse of the autonomic nervous system occurs, leading to a sudden drop in blood pressure and heart rate. This in turn results in a temporary and dramatic drop in blood flow to the brain, leading to a loss of consciousness. Fainting is one of the body's [place] defense mechanisms. Passing out puts the brain in a calmed state and causes it to shut down for a while, in order to begin the healing process.    Summary: 1.   Refer to this scale when providing [place] with your pain level. 2.   Be accurate and careful when reporting your pain level. This will help with your care. 3.   Over-reporting your pain level  will lead to loss of credibility. 4.   Even a level of 1/10 means that there is pain and will be treated at our facility. 5.   High, inaccurate reporting will be documented as "Symptom Exaggeration", leading to loss of credibility and suspicions of possible secondary gains such as obtaining more narcotics, or wanting to appear disabled, for fraudulent reasons. 6.   Only pain levels of 5 or below will be seen at our facility. 7.   Pain levels of 6 and above will be sent to the Emergency Department and the appointment cancelled.  ______________________________________________________________________________________________  ____________________________________________________________________________________________  Post-procedure Information What to expect: Most procedures involve the use of a local anesthetic (numbing medicine), and a steroid (anti-inflammatory medicine).  The local anesthetics may cause temporary numbness and weakness of the legs or arms, depending on the location of the block. This numbness/weakness may last 4-6 hours, depending on the local anesthetic used. In rare instances, it can last up to 24 hours. While numb, you must be very careful not to injure the extremity.  After any procedure, you could expect the pain to get better within 15-20 minutes. This relief is temporary and may last 4-6 hours. Once the local anesthetics wears off, you could experience discomfort, possibly  more than usual, for up to 10 (ten) days. In the case of radiofrequencies, it may last up to 6 weeks. Surgeries may take up to 8 weeks for the healing process. The discomfort is due to the irritation caused by needles going through skin and muscle. To minimize the discomfort, we recommend using ice the first day, and heat from then on. The ice should be applied for 15 minutes on, and 15 minutes off. Keep repeating this cycle until bedtime. Avoid applying the ice directly to the skin, to prevent frostbite.  Heat should be used daily, until the pain improves (4-10 days). Be careful not to burn yourself.  Occasionally you may experience muscle spasms or cramps. These occur as a consequence of the irritation caused by the needle sticks to the muscle and the blood that will inevitably be lost into the surrounding muscle tissue. Blood tends to be very irritating to tissues, which tend to react by going into spasm. These spasms may start the same day of your procedure, but they may also take days to develop. This late onset type of spasm or cramp is usually caused by electrolyte imbalances triggered by the steroids, at the level of the kidney. Cramps and spasms tend to respond well to muscle relaxants, multivitamins (some are triggered by the procedure, but may have their origins in vitamin deficiencies), and "Gatorade", or any sports drinks that can replenish any electrolyte imbalances. (If you are a diabetic, ask your pharmacist to get you a sugar-free brand.) Warm showers or baths may also be helpful. Stretching exercises are highly recommended.  General Instructions:  Be alert for signs of possible infection: redness, swelling, heat, red streaks, elevated temperature, and/or fever. These typically appear 4 to 6 days after the procedure. Immediately notify your doctor if you experience unusual bleeding, difficulty breathing, or loss of bowel or bladder control. If you experience increased pain, do not increase your pain medicine intake, unless instructed by your pain physician.  Post-Procedure Care:  Be careful in moving about. Muscle spasms in the area of the injection may occur. Applying ice or heat to the area is often helpful. The incidence of spinal headaches after epidural injections ranges between 1.4% and 6%. If you develop a headache that does not seem to respond to conservative therapy, please let your physician know. This can be treated with an epidural blood patch.   Post-procedure numbness or  redness is to be expected, however it should average 4 to 6 hours. If numbness and weakness of your extremities begins to develop 4 to 6 hours after your procedure, and is felt to be progressing and worsening, immediately contact your physician.  Diet:  If you experience nausea, do not eat until this sensation goes away. If you had a "Stellate Ganglion Block" for upper extremity "Reflex Sympathetic Dystrophy", do not eat or drink until your hoarseness goes away. In any case, always start with liquids first and if you tolerate them well, then slowly progress to more solid foods.  Activity:  For the first 4 to 6 hours after the procedure, use caution in moving about as you may experience numbness and/or weakness. Use caution in cooking, using household electrical appliances, and climbing steps. If you need to reach your Doctor call our office: [phone] (During business hours) or [phone] (After business hours).  Business Hours: Monday-Thursday 8:00 am - 4:00 PM    Fridays: Closed     In case of an emergency: In case of emergency, call 911  or go to the nearest emergency room and have the physician there call us.  Interpretation of Procedure Every nerve block has two components: a diagnostic component, and a treatment component. Unrealistic expectations are the most common causes of "perceived failure".  In a perfect world, a single nerve block should be able to completely and permanently eliminate the pain. Sadly, the world is not perfect.  Most pain management nerve blocks are performed using local anesthetics and steroids. Steroids are responsible for any long-term benefit that you may experience. Their purpose is to decrease any chronic swelling that may exist in the area. Steroids begin to work immediately after being injected. However, most patients will not experience any benefits until 5 to 10 days after the injection, when the swelling has come down to the point where they can  tell a difference. Steroids will only help if there is swelling to be treated. As such, they can assist with the diagnosis. If effective, they suggest an inflammatory component to the pain, and if ineffective, they rule out inflammation as the main cause or component of the problem. If the problem is one of mechanical compression, you will get no benefit from those steroids.   In the case of local anesthetics, they have a crucial role in the diagnosis of your condition. Most will begin to work within15 to 20 minutes after injection. The duration will depend on the type used (short- vs. Long-acting). It is of outmost importance that patients keep tract of their pain, after the procedure. To assist with this matter, a "Post-procedure Pain Diary" is provided. Make sure to complete it and to bring it back to your follow-up appointment.  As long as the patient keeps accurate, detailed records of their symptoms after every procedure, and returns to have those interpreted, every procedure will provide [place] with invaluable information. Even a block that does not provide the patient with any relief, will always provide [place] with information about the mechanism and the origin of the pain. The only time a nerve block can be considered a waste of time is when patients do not keep track of the results, or do not keep their post-procedure appointment.  Reporting the results back to your physician The Pain Score  Pain is a subjective complaint. It cannot be seen, touched, or measured. We depend entirely on the patient's report of the pain in order to assess your condition and treatment. To evaluate the pain, we use a pain scale, where "0" means "No Pain", and a "10" is "the worst possible pain that you can even imagine" (i.e. something like been eaten alive by a shark or being torn apart by a lion).   Use the Pain Scale provided. You will frequently be asked to rate your pain. Please be accurate, remember that medical  decisions will be based on your responses. Please do not rate your pain above a 10. Doing so is actually interpreted as "symptom magnification" (exaggeration). To put this into perspective, when you tell us that your pain is at a 10 (ten), what you are saying is that there is nothing we can do to make this pain any worse. (Carefully think about that.) ____________________________________________________________________________________________  Attempted to call patient, message left. Pain Management Discharge Instructions  General Discharge Instructions :  If you need to reach your doctor call: Monday-Friday 8:00 am - 4:00 pm at [phone] or toll free [phone].  After clinic hours [phone] to have operator reach doctor.  Bring all of your medication bottles  to all your appointments in the pain clinic.  To cancel or reschedule your appointment with Pain Management please remember to call 24 hours in advance to avoid a fee.  Refer to the educational materials which you have been given on: General Risks, I had my Procedure. Discharge Instructions, Post Sedation.  Post Procedure Instructions:  The drugs you were given will stay in your system until tomorrow, so for the next 24 hours you should not drive, make any legal decisions or drink any alcoholic beverages.  You may eat anything you prefer, but it is better to start with liquids then soups and crackers, and gradually work up to solid foods.  Please notify your doctor immediately if you have any unusual bleeding, trouble breathing or pain that is not related to your normal pain.  Depending on the type of procedure that was done, some parts of your body may feel week and/or numb.  This usually clears up by tonight or the next day.  Walk with the use of an assistive device or accompanied by an adult for the 24 hours.  You may use ice on the affected area for the first 24 hours.  Put ice in a Ziploc bag and cover with a towel and  place against area 15 minutes on 15 minutes off.  You may switch to heat after 24 hours.Epidural Steroid Injection Patient Information  Description: The epidural space surrounds the nerves as they exit the spinal cord.  In some patients, the nerves can be compressed and inflamed by a bulging disc or a tight spinal canal (spinal stenosis).  By injecting steroids into the epidural space, we can bring irritated nerves into direct contact with a potentially helpful medication.  These steroids act directly on the irritated nerves and can reduce swelling and inflammation which often leads to decreased pain.  Epidural steroids may be injected anywhere along the spine and from the neck to the low back depending upon the location of your pain.   After numbing the skin with local anesthetic (like Novocaine), a small needle is passed into the epidural space slowly.  You may experience a sensation of pressure while this is being done.  The entire block usually last less than 10 minutes.  Conditions which may be treated by epidural steroids:  Low back and leg pain Neck and arm pain Spinal stenosis Post-laminectomy syndrome Herpes zoster (shingles) pain Pain from compression fractures  Preparation for the injection:  Do not eat any solid food or dairy products within 8 hours of your appointment.  You may drink clear liquids up to 3 hours before appointment.  Clear liquids include water, black coffee, juice or soda.  No milk or cream please. You may take your regular medication, including pain medications, with a sip of water before your appointment  Diabetics should hold regular insulin (if taken separately) and take 1/2 normal NPH dos the morning of the procedure.  Carry some sugar containing items with you to your appointment. A driver must accompany you and be prepared to drive you home after your procedure.  Bring all your current medications with your. An IV may be inserted and sedation may be given at  the discretion of the physician.   A blood pressure cuff, EKG and other monitors will often be applied during the procedure.  Some patients may need to have extra oxygen administered for a short period. You will be asked to provide medical information, including your allergies, prior to the procedure.  We  must know immediately if you are taking blood thinners (like Coumadin/Warfarin)  Or if you are allergic to IV iodine contrast (dye). We must know if you could possible be pregnant.  Possible side-effects: Bleeding from needle site Infection (rare, may require surgery) Nerve injury (rare) Numbness & tingling (temporary) Difficulty urinating (rare, temporary) Spinal headache ( a headache worse with upright posture) Light -headedness (temporary) Pain at injection site (several days) Decreased blood pressure (temporary) Weakness in arm/leg (temporary) Pressure sensation in back/neck (temporary)  Call if you experience: Fever/chills associated with headache or increased back/neck pain. Headache worsened by an upright position. New onset weakness or numbness of an extremity below the injection site Hives or difficulty breathing (go to the emergency room) Inflammation or drainage at the infection site Severe back/neck pain Any new symptoms which are concerning to you  Please note:  Although the local anesthetic injected can often make your back or neck feel good for several hours after the injection, the pain will likely return.  It takes 3-7 days for steroids to work in the epidural space.  You may not notice any pain relief for at least that one week.  If effective, we will often do a series of three injections spaced 3-6 weeks apart to maximally decrease your pain.  After the initial series, we generally will wait several months before considering a repeat injection of the same type.  If you have any questions, please call [phone] [facility]

## 2021-11-17 ENCOUNTER — Telehealth: Payer: Self-pay

## 2021-11-17 ENCOUNTER — Other Ambulatory Visit: Payer: Self-pay

## 2021-11-17 MED ORDER — ROSUVASTATIN CALCIUM 10 MG PO TABS: 10.0000 mg | ORAL_TABLET | Freq: Every day | ORAL | 3 refills | Status: DC

## 2021-11-17 MED ORDER — CITALOPRAM HYDROBROMIDE 20 MG PO TABS: 20.0000 mg | ORAL_TABLET | Freq: Every day | ORAL | 1 refills | Status: DC

## 2021-11-17 NOTE — Telephone Encounter (Signed)
Post procedure phone call. Patient states she is doing good.  

## 2021-11-17 NOTE — Telephone Encounter (Signed)
Patient states she needs a refill for her citalopram (CELEXA) 20 MG tablet and her rosuvastatin (CRESTOR) 10 MG tablet.  Patient states she is out of both of these medications.  Patient states she would like for [place] to please call her when we have sent her prescription to the pharmacy.  Patient states she thinks they are short-staffed at the pharmacy so they probably won't call her.  *Patient states her preferred pharmacy is [facility] on [address].

## 2021-11-17 NOTE — Telephone Encounter (Signed)
Lm that both prescriptions sent to [facility] [address].

## 2021-11-28 ENCOUNTER — Encounter (INDEPENDENT_AMBULATORY_CARE_PROVIDER_SITE_OTHER): Payer: Self-pay

## 2021-12-05 DIAGNOSIS — R195 Other fecal abnormalities: Secondary | ICD-10-CM | POA: Diagnosis not present

## 2021-12-05 DIAGNOSIS — R1310 Dysphagia, unspecified: Secondary | ICD-10-CM | POA: Diagnosis not present

## 2021-12-05 DIAGNOSIS — Z86010 Personal history of colonic polyps: Secondary | ICD-10-CM | POA: Diagnosis not present

## 2021-12-05 DIAGNOSIS — Z7901 Long term (current) use of anticoagulants: Secondary | ICD-10-CM | POA: Diagnosis not present

## 2021-12-10 DIAGNOSIS — J449 Chronic obstructive pulmonary disease, unspecified: Secondary | ICD-10-CM | POA: Diagnosis not present

## 2021-12-26 ENCOUNTER — Telehealth: Payer: Self-pay | Admitting: Anesthesiology

## 2021-12-26 NOTE — Telephone Encounter (Signed)
Patient wants to have another injection. Looks like Dr A ordered an [place]. Has this been approved ? She is ready to schedule

## 2021-12-26 NOTE — Telephone Encounter (Signed)
Nurses please disregard, sent to you by mistake

## 2022-01-09 DIAGNOSIS — J449 Chronic obstructive pulmonary disease, unspecified: Secondary | ICD-10-CM | POA: Diagnosis not present

## 2022-01-11 ENCOUNTER — Ambulatory Visit (INDEPENDENT_AMBULATORY_CARE_PROVIDER_SITE_OTHER): Payer: Medicare Other | Admitting: Internal Medicine

## 2022-01-11 VITALS — BP 140/86 | HR 74 | Resp 17 | Ht 64.0 in | Wt 225.0 lb

## 2022-01-11 DIAGNOSIS — D6859 Other primary thrombophilia: Secondary | ICD-10-CM

## 2022-01-11 DIAGNOSIS — I10 Essential (primary) hypertension: Secondary | ICD-10-CM

## 2022-01-11 DIAGNOSIS — R739 Hyperglycemia, unspecified: Secondary | ICD-10-CM | POA: Diagnosis not present

## 2022-01-11 DIAGNOSIS — Z0000 Encounter for general adult medical examination without abnormal findings: Secondary | ICD-10-CM

## 2022-01-11 DIAGNOSIS — J449 Chronic obstructive pulmonary disease, unspecified: Secondary | ICD-10-CM

## 2022-01-11 DIAGNOSIS — Z1231 Encounter for screening mammogram for malignant neoplasm of breast: Secondary | ICD-10-CM

## 2022-01-11 DIAGNOSIS — E7800 Pure hypercholesterolemia, unspecified: Secondary | ICD-10-CM

## 2022-01-11 DIAGNOSIS — Z23 Encounter for immunization: Secondary | ICD-10-CM

## 2022-01-11 DIAGNOSIS — I700 Atherosclerosis of aorta: Secondary | ICD-10-CM

## 2022-01-11 DIAGNOSIS — J4520 Mild intermittent asthma, uncomplicated: Secondary | ICD-10-CM

## 2022-01-11 DIAGNOSIS — Z1211 Encounter for screening for malignant neoplasm of colon: Secondary | ICD-10-CM

## 2022-01-11 DIAGNOSIS — D6851 Activated protein C resistance: Secondary | ICD-10-CM

## 2022-01-11 DIAGNOSIS — M5442 Lumbago with sciatica, left side: Secondary | ICD-10-CM | POA: Diagnosis not present

## 2022-01-11 DIAGNOSIS — F439 Reaction to severe stress, unspecified: Secondary | ICD-10-CM

## 2022-01-11 LAB — HEPATIC FUNCTION PANEL
ALT: 11 U/L (ref 0–35)
AST: 16 U/L (ref 0–37)
Albumin: 4.1 g/dL (ref 3.5–5.2)
Alkaline Phosphatase: 53 U/L (ref 39–117)
Bilirubin, Direct: 0.2 mg/dL (ref 0.0–0.3)
Total Bilirubin: 0.8 mg/dL (ref 0.2–1.2)
Total Protein: 6.6 g/dL (ref 6.0–8.3)

## 2022-01-11 LAB — LIPID PANEL
Cholesterol: 158 mg/dL (ref 0–200)
HDL: 53.7 mg/dL (ref >39.00)
LDL Cholesterol: 84 mg/dL (ref 0–99)
NonHDL: 104.54
Total CHOL/HDL Ratio: 3
Triglycerides: 101 mg/dL (ref 0.0–149.0)
VLDL: 20.2 mg/dL (ref 0.0–40.0)

## 2022-01-11 LAB — BASIC METABOLIC PANEL
BUN: 17 mg/dL (ref 6–23)
CO2: 26 mEq/L (ref 19–32)
Calcium: 9 mg/dL (ref 8.4–10.5)
Chloride: 106 mEq/L (ref 96–112)
Creatinine, Ser: 1.04 mg/dL (ref 0.40–1.20)
GFR: 51.57 mL/min — ABNORMAL LOW (ref >60.00)
Glucose, Bld: 95 mg/dL (ref 70–99)
Potassium: 4.6 mEq/L (ref 3.5–5.1)
Sodium: 141 mEq/L (ref 135–145)

## 2022-01-11 LAB — CBC WITH DIFFERENTIAL/PLATELET
Basophils Absolute: 0.1 10*3/uL (ref 0.0–0.1)
Basophils Relative: 0.8 % (ref 0.0–3.0)
Eosinophils Absolute: 0.3 10*3/uL (ref 0.0–0.7)
Eosinophils Relative: 3.5 % (ref 0.0–5.0)
HCT: 42.8 % (ref 36.0–46.0)
Hemoglobin: 14.2 g/dL (ref 12.0–15.0)
Lymphocytes Relative: 32.3 % (ref 12.0–46.0)
Lymphs Abs: 2.6 10*3/uL (ref 0.7–4.0)
MCHC: 33 g/dL (ref 30.0–36.0)
MCV: 89.7 fl (ref 78.0–100.0)
Monocytes Absolute: 0.6 10*3/uL (ref 0.1–1.0)
Monocytes Relative: 7 % (ref 3.0–12.0)
Neutro Abs: 4.6 10*3/uL (ref 1.4–7.7)
Neutrophils Relative %: 56.4 % (ref 43.0–77.0)
Platelets: 269 10*3/uL (ref 150.0–400.0)
RBC: 4.78 Mil/uL (ref 3.87–5.11)
RDW: 14.7 % (ref 11.5–15.5)
WBC: 8.2 10*3/uL (ref 4.0–10.5)

## 2022-01-11 LAB — TSH: TSH: 4.43 u[IU]/mL (ref 0.35–5.50)

## 2022-01-11 LAB — HEMOGLOBIN A1C: Hgb A1c MFr Bld: 6.1 % (ref 4.6–6.5)

## 2022-01-11 MED ORDER — TELMISARTAN 40 MG PO TABS: 40.0000 mg | ORAL_TABLET | Freq: Every day | ORAL | 1 refills | Status: DC

## 2022-01-11 NOTE — Assessment & Plan Note (Addendum)
Physical today 01/11/22.  Mammogram 11/15/20 - Birads I. Scheduled for f/u mammogram.  Plan colonoscopy as outlined.

## 2022-01-11 NOTE — Progress Notes (Signed)
Patient ID: Shaneeka Scarboro Ryner, female   DOB: 05-Oct-1945, 76 y.o.   MRN: [id number]   Subjective:    Patient ID: Tawny Asal Coutant, female    DOB: 03/03/1945, 76 y.o.   MRN: [id number]   Patient here for physical   HPI Here for physical exam.  Follow up regarding her breathing, blood pressure and cholesterol. Breathing better.  No increased cough or congestion.  Uses her nebulizer.  Not needing rescue inhaler.  No chest pain.  No dizziness reported.  No abdominal pain.   Bowels moving.  Increased back pain.  Limits activity.  Is being followed at pain clinic - injection - has f/u next week. Not taking blood pressure medication. Lost her pills.  Reports blood pressure elevated this am.  No headache or dizziness reported.     Past Medical History:  Diagnosis Date   Allergy    Asthma    Chicken pox    COPD (chronic obstructive pulmonary disease) ([place])    History of blood clots    Hypercholesterolemia    Hypertension    Retroperitoneal hematoma 03/2019   Past Surgical History:  Procedure Laterality Date   blood clots  2008   TUBAL LIGATION     Family History  Problem Relation Age of Onset   Cancer Mother        Breast   Heart disease Mother    Stroke Mother    Hypertension Mother    Breast cancer Mother        39's   Heart disease Father    Stroke Father    Hypertension Father    Diabetes Father    Colon cancer Other        paternal cousin   Social History   Socioeconomic History   Marital status: Widowed    Spouse name: Not on file   Number of children: Not on file   Years of education: Not on file   Highest education level: Not on file  Occupational History   Not on file  Tobacco Use   Smoking status: Never    Passive exposure: Past   Smokeless tobacco: Never   Tobacco comments:    Exposed to chemical and 2nd had smoke.  Vaping Use   Vaping Use: Never used  Substance and Sexual Activity   Alcohol use: No    Alcohol/week: 0.0 standard drinks of alcohol   Drug use: No    Sexual activity: Not on file  Other Topics Concern   Not on file  Social History Narrative   Lives in [place] with son.  never smoked; no alcohol. Used to work in [employer]    Social Determinants of Health   Financial Resource Strain: Low Risk  (12/28/2020)   Overall Financial Resource Strain (CARDIA)    Difficulty of Paying Living Expenses: Not very hard  Recent Concern: Financial Resource Strain - Medium Risk (11/02/2020)   Overall Financial Resource Strain (CARDIA)    Difficulty of Paying Living Expenses: Somewhat hard  Food Insecurity: No Food Insecurity (10/21/2020)   Hunger Vital Sign    Worried About Running Out of Food in the Last Year: Never true    Ran Out of Food in the Last Year: Never true  Transportation Needs: No Transportation Needs (10/21/2020)   PRAPARE - Hydrologist (Medical): No    Lack of Transportation (Non-Medical): No  Physical Activity: Unknown (08/19/2018)   Exercise Vital Sign    Days of Exercise per Week:  0 days    Minutes of Exercise per Session: Not on file  Stress: No Stress Concern Present (10/21/2020)   [place]    Feeling of Stress : Not at all  Social Connections: Unknown (10/21/2020)   Social Connection and Isolation Panel [NHANES]    Frequency of Communication with Friends and Family: More than three times a week    Frequency of Social Gatherings with Friends and Family: More than three times a week    Attends Religious Services: Not on Advertising copywriter or Organizations: Not on file    Attends Archivist Meetings: Not on file    Marital Status: Not on file     Review of Systems  Constitutional:  Negative for appetite change and unexpected weight change.  HENT:  Negative for congestion and sinus pressure.   Respiratory:  Negative for cough and chest tightness.        Breathing stable.   Cardiovascular:  Negative for  chest pain and palpitations.       No increased leg swelling  Gastrointestinal:  Negative for abdominal pain, diarrhea, nausea and vomiting.  Genitourinary:  Negative for difficulty urinating and dysuria.  Musculoskeletal:  Positive for back pain. Negative for joint swelling and myalgias.  Skin:  Negative for color change and rash.  Neurological:  Negative for dizziness and headaches.  Psychiatric/Behavioral:  Negative for agitation and dysphoric mood.        Objective:     BP (!) 140/86   Pulse 74   Resp 17   Ht _0  (1.626 m)   Wt 225 lb (102.1 kg)   LMP 04/29/1997   SpO2 97%   BMI 38.62 kg/m  Wt Readings from Last 3 Encounters:  01/17/22 225 lb (102.1 kg)  01/11/22 225 lb (102.1 kg)  11/16/21 220 lb (99.8 kg)    Physical Exam Vitals reviewed.  Constitutional:      General: She is not in acute distress.    Appearance: Normal appearance. She is well-developed.  HENT:     Head: Normocephalic and atraumatic.     Right Ear: External ear normal.     Left Ear: External ear normal.  Eyes:     General: No scleral icterus.       Right eye: No discharge.        Left eye: No discharge.     Conjunctiva/sclera: Conjunctivae normal.  Neck:     Thyroid: No thyromegaly.  Cardiovascular:     Rate and Rhythm: Normal rate and regular rhythm.  Pulmonary:     Effort: No tachypnea, accessory muscle usage or respiratory distress.     Breath sounds: Normal breath sounds. No decreased breath sounds or wheezing.  Chest:  Breasts:    Right: No inverted nipple, mass, nipple discharge or tenderness (no axillary adenopathy).     Left: No inverted nipple, mass, nipple discharge or tenderness (no axilarry adenopathy).  Abdominal:     General: Bowel sounds are normal.     Palpations: Abdomen is soft.     Tenderness: There is no abdominal tenderness.  Musculoskeletal:        General: No swelling or tenderness.     Cervical back: Neck supple. No tenderness.  Lymphadenopathy:      Cervical: No cervical adenopathy.  Skin:    Findings: No erythema or rash.  Neurological:     Mental Status: She is alert and oriented to  person, place, and time.  Psychiatric:        Mood and Affect: Mood normal.        Behavior: Behavior normal.      Outpatient Encounter Medications as of 01/11/2022  Medication Sig   albuterol (ACCUNEB) 0.63 MG/3ML nebulizer solution Take 3 mLs (0.63 mg total) by nebulization every 6 (six) hours as needed for wheezing.   albuterol (VENTOLIN HFA) 108 (90 Base) MCG/ACT inhaler Inhale 2 puffs into the lungs every 6 (six) hours as needed for wheezing or shortness of breath.   apixaban (ELIQUIS) 2.5 MG TABS tablet Take 1 tablet (2.5 mg total) by mouth 2 (two) times daily.   citalopram (CELEXA) 20 MG tablet Take 1 tablet (20 mg total) by mouth daily.   rosuvastatin (CRESTOR) 10 MG tablet Take 1 tablet (10 mg total) by mouth daily.   telmisartan (MICARDIS) 40 MG tablet Take 1 tablet (40 mg total) by mouth daily.   [DISCONTINUED] losartan (COZAAR) 50 MG tablet TAKE 1 TABLET DAILY WITH LUNCH   No facility-administered encounter medications on file as of 01/11/2022.     Lab Results  Component Value Date   WBC 8.2 01/11/2022   HGB 14.2 01/11/2022   HCT 42.8 01/11/2022   PLT 269.0 01/11/2022   GLUCOSE 95 01/11/2022   CHOL 158 01/11/2022   TRIG 101.0 01/11/2022   HDL 53.70 01/11/2022   LDLDIRECT 89.0 09/28/2020   LDLCALC 84 01/11/2022   ALT 11 01/11/2022   AST 16 01/11/2022   NA 141 01/11/2022   K 4.6 01/11/2022   CL 106 01/11/2022   CREATININE 1.04 01/11/2022   BUN 17 01/11/2022   CO2 26 01/11/2022   TSH 4.43 01/11/2022   INR 1.2 04/23/2019   HGBA1C 6.1 01/11/2022    MM 3D SCREEN BREAST BILATERAL  Result Date: 11/19/2020 CLINICAL DATA:  Screening. EXAM: DIGITAL SCREENING BILATERAL MAMMOGRAM WITH TOMOSYNTHESIS AND CAD TECHNIQUE: Bilateral screening digital craniocaudal and mediolateral oblique mammograms were obtained. Bilateral screening  digital breast tomosynthesis was performed. The images were evaluated with computer-aided detection. COMPARISON:  Previous exam(s). ACR Breast Density Category b: There are scattered areas of fibroglandular density. FINDINGS: There are no findings suspicious for malignancy. IMPRESSION: No mammographic evidence of malignancy. A result letter of this screening mammogram will be mailed directly to the patient. RECOMMENDATION: Screening mammogram in one year. (Code:SM-B-01Y) BI-RADS CATEGORY  1: Negative. Electronically Signed   By: Abelardo Diesel M.D.   On: 11/19/2020 11:25      Assessment & Plan:   Problem List Items Addressed This Visit     Aortic atherosclerosis ([place])    Continue crestor.       Relevant Medications   telmisartan (MICARDIS) 40 MG tablet   Asthma    Breathing stable.  Continues nebulizer.  Follow.       Colon cancer screening    Father's side of family with history of colon cancer.  Saw GI.  Was scheduled for colonoscopy.  Was put on hold until dizziness resolved and acute infection cleared. Dizziness resolved.  Breathing better.  No increased cough or congestion. Pulmonary cleared.  Discussed need to schedule colonoscopy.       COPD GOLD 2/ AB     Saw pulmonary (Dr Melvyn Novas).  Feels most of her symptoms/issues - more restrictive changes.  See note.  She is using neb q day.  Not needing rescue inhaler.  No increased cough or congestion.  Feels better.  Follow.  Essential hypertension, benign    Blood pressure elevated.  Not taking her blood pressure medication.  Lost her prescription.  Start micardis 70m q day.  Follow pressures.  Follow metabolic panel.       Relevant Medications   telmisartan (MICARDIS) 40 MG tablet   Other Relevant Orders   Basic metabolic panel (Completed)   Factor V Leiden ([place]    On eliquis.  Discussed with hematology - ok to stop for colonoscopy.        Health care maintenance    Physical today 01/11/22.  Mammogram 11/15/20 - Birads I.  Scheduled for f/u mammogram.  Plan colonoscopy as outlined.        Hypercholesterolemia    Continue crestor.  Low cholesterol diet and exercise.  Follow lipid panel and liver function tests.        Relevant Medications   telmisartan (MICARDIS) 40 MG tablet   Other Relevant Orders   CBC with Differential/Platelet (Completed)   TSH (Completed)   Lipid panel (Completed)   Hepatic function panel (Completed)   Hyperglycemia    Low carb diet and exercise.  Follow met b and a1c.       Relevant Orders   Hemoglobin A1c (Completed)   Low back pain    Persistent pain.  Being followed by pain clinic.  Has f/u next week.       Primary hypercoagulable state ([place]    Continue eliquis as outlined.  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  Recommended indefinite anticoagulation.        Stress    Continues on citalopram.  Overall appears to be handling things well.  Follow.       Other Visit Diagnoses     Routine general medical examination at a health care facility    -  Primary   Encounter for screening mammogram for malignant neoplasm of breast       Relevant Orders   MM 3D SCREEN BREAST BILATERAL   Need for influenza vaccination       Relevant Orders   Flu Vaccine QUAD High Dose(Fluad)   Need for immunization against influenza       Relevant Orders   Flu Vaccine QUAD High Dose(Fluad) (Completed)       CEinar Pheasant MD

## 2022-01-17 ENCOUNTER — Ambulatory Visit (HOSPITAL_BASED_OUTPATIENT_CLINIC_OR_DEPARTMENT_OTHER): Payer: Medicare Other | Admitting: Anesthesiology

## 2022-01-17 ENCOUNTER — Other Ambulatory Visit: Payer: Self-pay | Admitting: Anesthesiology

## 2022-01-17 ENCOUNTER — Encounter: Payer: Self-pay | Admitting: Anesthesiology

## 2022-01-17 ENCOUNTER — Ambulatory Visit
Admission: RE | Admit: 2022-01-17 | Discharge: 2022-01-17 | Disposition: A | Discharge status: Home / Self Care | Payer: Medicare Other | Source: Ambulatory Visit | Attending: Anesthesiology | Admitting: Anesthesiology

## 2022-01-17 VITALS — BP 127/59 | HR 86 | Temp 96.6°F | Resp 17 | Ht 64.0 in | Wt 225.0 lb

## 2022-01-17 DIAGNOSIS — G8929 Other chronic pain: Secondary | ICD-10-CM | POA: Diagnosis not present

## 2022-01-17 DIAGNOSIS — M5136 Other intervertebral disc degeneration, lumbar region: Secondary | ICD-10-CM | POA: Insufficient documentation

## 2022-01-17 DIAGNOSIS — M47816 Spondylosis without myelopathy or radiculopathy, lumbar region: Secondary | ICD-10-CM

## 2022-01-17 DIAGNOSIS — M48062 Spinal stenosis, lumbar region with neurogenic claudication: Secondary | ICD-10-CM

## 2022-01-17 DIAGNOSIS — M1711 Unilateral primary osteoarthritis, right knee: Secondary | ICD-10-CM | POA: Insufficient documentation

## 2022-01-17 MED ORDER — ROPIVACAINE HCL 2 MG/ML IJ SOLN
INTRAMUSCULAR | Status: AC
  Filled 2022-01-17: qty 20

## 2022-01-17 MED ORDER — LIDOCAINE HCL (PF) 1 % IJ SOLN
5.0000 mL | Freq: Once | INTRAMUSCULAR | Status: AC
  Administered 2022-01-17: 5 mL via SUBCUTANEOUS

## 2022-01-17 MED ORDER — IOHEXOL 180 MG/ML  SOLN
INTRAMUSCULAR | Status: AC
  Filled 2022-01-17: qty 20

## 2022-01-17 MED ORDER — SODIUM CHLORIDE (PF) 0.9 % IJ SOLN
INTRAMUSCULAR | Status: AC
  Filled 2022-01-17: qty 10

## 2022-01-17 MED ORDER — IOHEXOL 180 MG/ML  SOLN
10.0000 mL | Freq: Once | INTRAMUSCULAR | Status: AC | PRN
  Administered 2022-01-17: 10 mL via EPIDURAL

## 2022-01-17 MED ORDER — SODIUM CHLORIDE 0.9% FLUSH
10.0000 mL | Freq: Once | INTRAVENOUS | Status: AC
  Administered 2022-01-17: 10 mL

## 2022-01-17 MED ORDER — TRIAMCINOLONE ACETONIDE 40 MG/ML IJ SUSP
40.0000 mg | Freq: Once | INTRAMUSCULAR | Status: AC
  Administered 2022-01-17: 40 mg

## 2022-01-17 MED ORDER — ROPIVACAINE HCL 2 MG/ML IJ SOLN
10.0000 mL | Freq: Once | INTRAMUSCULAR | Status: AC
  Administered 2022-01-17: 1 mL via EPIDURAL

## 2022-01-17 MED ORDER — TRIAMCINOLONE ACETONIDE 40 MG/ML IJ SUSP
INTRAMUSCULAR | Status: AC
  Filled 2022-01-17: qty 1

## 2022-01-17 MED ORDER — LIDOCAINE HCL (PF) 1 % IJ SOLN
INTRAMUSCULAR | Status: AC
  Filled 2022-01-17: qty 5

## 2022-01-17 NOTE — Patient Instructions (Signed)

## 2022-01-17 NOTE — Progress Notes (Signed)
Subjective:  [name], female    DOB: [birth year]  Age: 76 y.o. MRN: [id number]  CC: Back Pain (lower)   Procedure: L5-S1 epidural steroid and fluoroscopic guidance with no sedation  HPI Tina Duarte presents for reevaluation.  She was last seen approximately 2 months ago and had an epidural steroid injection at that time.  She reported significant improvement lasting about 3 weeks to 4 weeks before she had recurrence of her low back pain.  The severity is slightly diminished from that point.  She has no leg pain.  She feels that she did respond favorably to the epidural and desires to proceed with a repeat injection today.  She stopped her Eliquis 3 days ago.  No other change in lower extremity strength function or bowel or bladder function is noted at this time.  Outpatient Medications Prior to Visit  Medication Sig Dispense Refill   albuterol (ACCUNEB) 0.63 MG/3ML nebulizer solution Take 3 mLs (0.63 mg total) by nebulization every 6 (six) hours as needed for wheezing. 75 mL 3   albuterol (VENTOLIN HFA) 108 (90 Base) MCG/ACT inhaler Inhale 2 puffs into the lungs every 6 (six) hours as needed for wheezing or shortness of breath. 18 g 2   apixaban (ELIQUIS) 2.5 MG TABS tablet Take 1 tablet (2.5 mg total) by mouth 2 (two) times daily. 60 tablet 3   citalopram (CELEXA) 20 MG tablet Take 1 tablet (20 mg total) by mouth daily. 90 tablet 1   rosuvastatin (CRESTOR) 10 MG tablet Take 1 tablet (10 mg total) by mouth daily. 90 tablet 3   telmisartan (MICARDIS) 40 MG tablet Take 1 tablet (40 mg total) by mouth daily. 90 tablet 1   No facility-administered medications prior to visit.    Review of Systems CNS: No confusion or sedation Cardiac: No angina or palpitations GI: No abdominal pain or constipation Constitutional: No nausea vomiting fevers or chills  Objective:  BP (!) 127/59   Pulse 86   Temp (!) 96.6 F (35.9 C) (Temporal)   Resp 17   Ht '5\' 4"'$  (1.626 m)   Wt 225 lb  (102.1 kg)   LMP 04/29/1997   SpO2 94%   BMI 38.62 kg/m    BP Readings from Last 3 Encounters:  01/17/22 (!) 127/59  01/11/22 (!) 148/96  11/16/21 (!) 161/65     Wt Readings from Last 3 Encounters:  01/17/22 225 lb (102.1 kg)  01/11/22 225 lb (102.1 kg)  11/16/21 220 lb (99.8 kg)     Physical Exam Pt is alert and oriented PERRL EOMI HEART IS RRR no murmur or rub LCTA no wheezing or rales MUSCULOSKELETAL muscle tone and bulk remains at baseline.  Labs  Lab Results  Component Value Date   HGBA1C 6.1 01/11/2022   HGBA1C 6.0 09/20/2021   HGBA1C 5.8 06/02/2021   Lab Results  Component Value Date   LDLCALC 84 01/11/2022   CREATININE 1.04 01/11/2022    -------------------------------------------------------------------------------------------------------------------- Lab Results  Component Value Date   WBC 8.2 01/11/2022   HGB 14.2 01/11/2022   HCT 42.8 01/11/2022   PLT 269.0 01/11/2022   GLUCOSE 95 01/11/2022   CHOL 158 01/11/2022   TRIG 101.0 01/11/2022   HDL 53.70 01/11/2022   LDLDIRECT 89.0 09/28/2020   LDLCALC 84 01/11/2022   ALT 11 01/11/2022   AST 16 01/11/2022   NA 141 01/11/2022   K 4.6 01/11/2022   CL 106 01/11/2022   CREATININE 1.04 01/11/2022   BUN  17 01/11/2022   CO2 26 01/11/2022   TSH 4.43 01/11/2022   INR 1.2 04/23/2019   HGBA1C 6.1 01/11/2022    --------------------------------------------------------------------------------------------------------------------- DG PAIN CLINIC C-ARM 1-60 MIN NO REPORT  Result Date: 01/17/2022 Fluoro was used, but no Radiologist interpretation will be provided. Please refer to "NOTES" tab for provider progress note.    Assessment & Plan:   [name] was seen today for back pain.  Diagnoses and all orders for this visit:  Primary osteoarthritis of right knee  DDD (degenerative disc disease), lumbar -     triamcinolone acetonide (KENALOG-40) injection 40 mg -     sodium chloride flush (NS) 0.9 %  injection 10 mL -     ropivacaine (PF) 2 mg/mL (0.2%) (NAROPIN) injection 10 mL -     lidocaine (PF) (XYLOCAINE) 1 % injection 5 mL -     iohexol (OMNIPAQUE) 180 MG/ML injection 10 mL -     Lumbar Epidural Injection; Future  Spinal stenosis of lumbar region with neurogenic claudication -     triamcinolone acetonide (KENALOG-40) injection 40 mg -     sodium chloride flush (NS) 0.9 % injection 10 mL -     ropivacaine (PF) 2 mg/mL (0.2%) (NAROPIN) injection 10 mL -     lidocaine (PF) (XYLOCAINE) 1 % injection 5 mL -     iohexol (OMNIPAQUE) 180 MG/ML injection 10 mL -     Lumbar Epidural Injection; Future  Facet arthritis of lumbar region        ----------------------------------------------------------------------------------------------------------------------  Problem List Items Addressed This Visit       Unprioritized   DDD (degenerative disc disease), lumbar   Relevant Orders   Lumbar Epidural Injection   Facet arthritis of lumbar region   Spinal stenosis of lumbar region   Relevant Orders   Lumbar Epidural Injection   Other Visit Diagnoses     Primary osteoarthritis of right knee    -  Primary   Relevant Medications   triamcinolone acetonide (KENALOG-40) injection 40 mg (Completed)         ----------------------------------------------------------------------------------------------------------------------  1. Primary osteoarthritis of right knee  2. DDD (degenerative disc disease), lumbar We will proceed with a repeat lumbar epidural steroid injection today.  The risks and benefits were once again reviewed.  Contingent upon how she responds she may be a candidate for repeat injection in 2 to 3 months.  Unfortunately she has failed more conservative therapy and stretching strengthening exercises home physical therapy and medication management alone has not been effective at keeping her pain under control.  Her pain is most notable during the day with any  significant walking but this was better following her most recent epidural steroid injection.  Furthermore present she is having difficulty sleeping at night. - triamcinolone acetonide (KENALOG-40) injection 40 mg - sodium chloride flush (NS) 0.9 % injection 10 mL - ropivacaine (PF) 2 mg/mL (0.2%) (NAROPIN) injection 10 mL - lidocaine (PF) (XYLOCAINE) 1 % injection 5 mL - iohexol (OMNIPAQUE) 180 MG/ML injection 10 mL - Lumbar Epidural Injection; Future  3. Spinal stenosis of lumbar region with neurogenic claudication As above.  She is to restart her Eliquis tomorrow morning. - triamcinolone acetonide (KENALOG-40) injection 40 mg - sodium chloride flush (NS) 0.9 % injection 10 mL - ropivacaine (PF) 2 mg/mL (0.2%) (NAROPIN) injection 10 mL - lidocaine (PF) (XYLOCAINE) 1 % injection 5 mL - iohexol (OMNIPAQUE) 180 MG/ML injection 10 mL - Lumbar Epidural Injection; Future  4. Facet arthritis of lumbar region     ----------------------------------------------------------------------------------------------------------------------  I am having Tina Duarte. Tina Duarte maintain her albuterol, albuterol, apixaban, citalopram, rosuvastatin, and telmisartan. We administered triamcinolone acetonide, sodium chloride flush, ropivacaine (PF) 2 mg/mL (0.2%), lidocaine (PF), and iohexol.   Meds ordered this encounter  Medications   triamcinolone acetonide (KENALOG-40) injection 40 mg   sodium chloride flush (NS) 0.9 % injection 10 mL   ropivacaine (PF) 2 mg/mL (0.2%) (NAROPIN) injection 10 mL   lidocaine (PF) (XYLOCAINE) 1 % injection 5 mL   iohexol (OMNIPAQUE) 180 MG/ML injection 10 mL   Patient's Medications  New Prescriptions   No medications on file  Previous Medications   ALBUTEROL (ACCUNEB) 0.63 MG/3ML NEBULIZER SOLUTION    Take 3 mLs (0.63 mg total) by nebulization every 6 (six) hours as needed for wheezing.   ALBUTEROL (VENTOLIN HFA) 108 (90 BASE) MCG/ACT INHALER    Inhale 2 puffs into the lungs  every 6 (six) hours as needed for wheezing or shortness of breath.   APIXABAN (ELIQUIS) 2.5 MG TABS TABLET    Take 1 tablet (2.5 mg total) by mouth 2 (two) times daily.   CITALOPRAM (CELEXA) 20 MG TABLET    Take 1 tablet (20 mg total) by mouth daily.   ROSUVASTATIN (CRESTOR) 10 MG TABLET    Take 1 tablet (10 mg total) by mouth daily.   TELMISARTAN (MICARDIS) 40 MG TABLET    Take 1 tablet (40 mg total) by mouth daily.  Modified Medications   No medications on file  Discontinued Medications   No medications on file   ----------------------------------------------------------------------------------------------------------------------  Follow-up: Return in about 3 months (around 04/18/2022) for evaluation, procedure.   Procedure: L5-S1 LESI with fluoroscopic guidance and without moderate sedation  NOTE: The risks, benefits, and expectations of the procedure have been discussed and explained to the patient who was understanding and in agreement with suggested treatment plan. No guarantees were made.  DESCRIPTION OF PROCEDURE: Lumbar epidural steroid injection with no IV Versed, EKG, blood pressure, pulse, and pulse oximetry monitoring. The procedure was performed with the patient in the prone position under fluoroscopic guidance.  Sterile prep x3 was initiated and I then injected subcutaneous lidocaine to the overlying L5-S1 site after its fluoroscopic identifictation.  Using strict aseptic technique, I then advanced an 18-gauge Tuohy epidural needle in the midline using interlaminar approach via loss-of-resistance to saline technique. There was negative aspiration for heme or  CSF.  I then confirmed position with both AP and Lateral fluoroscan.  2 cc of contrast dye were injected and a  total of 5 mL of Preservative-Free normal saline mixed with 40 mg of Kenalog and 1cc Ropicaine 0.2 percent were injected incrementally via the  epidurally placed needle. The needle was removed. The patient tolerated the  injection well and was convalesced and discharged to home in stable condition. Should the patient have any post procedure difficulty they have been instructed on how to contact us for assistance.   Molli Barrows, MD

## 2022-01-25 ENCOUNTER — Encounter: Payer: Self-pay | Admitting: Internal Medicine

## 2022-01-25 ENCOUNTER — Telehealth: Payer: Self-pay | Admitting: Internal Medicine

## 2022-01-25 NOTE — Assessment & Plan Note (Signed)
Continues on citalopram.  Overall appears to be handling things well.  Follow.  

## 2022-01-25 NOTE — Assessment & Plan Note (Signed)
Continue eliquis as outlined.  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  Recommended indefinite anticoagulation.

## 2022-01-25 NOTE — Assessment & Plan Note (Signed)
Father's side of family with history of colon cancer.  Saw GI.  Was scheduled for colonoscopy.  Was put on hold until dizziness resolved and acute infection cleared. Dizziness resolved.  Breathing better.  No increased cough or congestion. Pulmonary cleared.  Discussed need to schedule colonoscopy.

## 2022-01-25 NOTE — Assessment & Plan Note (Signed)
Blood pressure elevated.  Not taking her blood pressure medication.  Lost her prescription.  Start micardis '40mg'$  q day.  Follow pressures.  Follow metabolic panel.

## 2022-01-25 NOTE — Assessment & Plan Note (Signed)
Continue crestor 

## 2022-01-25 NOTE — Telephone Encounter (Signed)
Need to confirm if she received her flu vaccine (at her visit) and document if received.

## 2022-01-25 NOTE — Assessment & Plan Note (Signed)
Continue crestor.  Low cholesterol diet and exercise. Follow lipid panel and liver function tests.   

## 2022-01-25 NOTE — Assessment & Plan Note (Signed)
Saw pulmonary (Dr Melvyn Novas).  Feels most of her symptoms/issues - more restrictive changes.  See note.  She is using neb q day.  Not needing rescue inhaler.  No increased cough or congestion.  Feels better.  Follow.

## 2022-01-25 NOTE — Assessment & Plan Note (Signed)
Breathing stable.  Continues nebulizer.  Follow.

## 2022-01-25 NOTE — Assessment & Plan Note (Signed)
On eliquis.  Discussed with hematology - ok to stop for colonoscopy.   

## 2022-01-25 NOTE — Assessment & Plan Note (Signed)
Low carb diet and exercise.  Follow met b and a1c.  

## 2022-01-25 NOTE — Assessment & Plan Note (Signed)
Persistent pain.  Being followed by pain clinic.  Has f/u next week.

## 2022-01-26 NOTE — Telephone Encounter (Signed)
LM for pt to cb 

## 2022-01-27 NOTE — Telephone Encounter (Signed)
L/M FOR PT. TO C/B.

## 2022-02-09 DIAGNOSIS — J449 Chronic obstructive pulmonary disease, unspecified: Secondary | ICD-10-CM | POA: Diagnosis not present

## 2022-02-15 ENCOUNTER — Encounter: Payer: Self-pay | Admitting: Nurse Practitioner

## 2022-02-15 ENCOUNTER — Telehealth: Payer: Self-pay

## 2022-02-15 ENCOUNTER — Telehealth (INDEPENDENT_AMBULATORY_CARE_PROVIDER_SITE_OTHER): Payer: Medicare Other | Admitting: Nurse Practitioner

## 2022-02-15 VITALS — Ht 64.0 in | Wt 225.0 lb

## 2022-02-15 DIAGNOSIS — J441 Chronic obstructive pulmonary disease with (acute) exacerbation: Secondary | ICD-10-CM | POA: Diagnosis not present

## 2022-02-15 DIAGNOSIS — J9611 Chronic respiratory failure with hypoxia: Secondary | ICD-10-CM

## 2022-02-15 MED ORDER — PREDNISONE 20 MG PO TABS: 40.0000 mg | ORAL_TABLET | Freq: Every day | ORAL | 0 refills | Status: DC

## 2022-02-15 MED ORDER — DOXYCYCLINE HYCLATE 100 MG PO TABS: 100.0000 mg | ORAL_TABLET | Freq: Two times a day (BID) | ORAL | 0 refills | Status: DC

## 2022-02-15 NOTE — Telephone Encounter (Signed)
LMOM for pt to CB to get her checked in and connected to her MyChart video visit with Ollen Gross today at 3pm

## 2022-02-15 NOTE — Assessment & Plan Note (Addendum)
Advised patient to wear her oxygen. Instructed patient to seek in person care if oxygen is dropping below 90% or worsening shortness of breath develops.

## 2022-02-15 NOTE — Assessment & Plan Note (Addendum)
Due to increased cough and congestion will treat with Doxy 100 BID x 7 days and Prednisone 40 daily x 5 days. Instructed patient to use nebulizer every 6 hours for at least 2 days then she can use as needed.

## 2022-02-15 NOTE — Progress Notes (Signed)
MyChart Video Visit    Virtual Visit via Video Note   This visit type was conducted due to national recommendations for restrictions regarding the COVID-19 Pandemic (e.g. social distancing) in an effort to limit this patient's exposure and mitigate transmission in our community. This patient is at least at moderate risk for complications without adequate follow up. This format is felt to be most appropriate for this patient at this time. Physical exam was limited by quality of the video and audio technology used for the visit. CMA was able to get the patient set up on a video visit.  Patient location: Home. Patient and provider in visit Provider location: Office  I discussed the limitations of evaluation and management by telemedicine and the availability of in person appointments. The patient expressed understanding and agreed to proceed.  Visit Date: 02/15/2022  Today's healthcare provider: Tomasita Morrow, NP     Subjective:    [name], female    DOB: October 07, 1945, 77 y.o.   MRN: [id number]  Chief Complaint  Patient presents with   Cough    Started last week around Thursday no chest pain every now and then she coughs up phlegm.    HPI Patient reports cough x 6 days. She has oxygen at home. She states she is wheezing. Patient reports oxygen saturation- 90% without wearing her oxygen. She has been using her nebulizer as needed.  Respiratory illness:  Cough- Yes, occasionally productive  Congestion-    Sinus- No   Chest- Yes  Post nasal drip- Yes  Sore throat- No  Shortness of breath- Yes  Fever- No  Fatigue/Myalgia- Yes Headache- No Nausea/Vomiting- No Taste disturbance- No  Smell disturbance- No  Covid vaccination- None  Flu vaccination- 12/2021   Past Medical History:  Diagnosis Date   Allergy    Asthma    Chicken pox    COPD (chronic obstructive pulmonary disease) (HCC)    History of blood clots    Hypercholesterolemia    Hypertension     Retroperitoneal hematoma 03/2019    Past Surgical History:  Procedure Laterality Date   blood clots  2008   TUBAL LIGATION      Family History  Problem Relation Age of Onset   Cancer Mother        Breast   Heart disease Mother    Stroke Mother    Hypertension Mother    Breast cancer Mother        73's   Heart disease Father    Stroke Father    Hypertension Father    Diabetes Father    Colon cancer Other        paternal cousin    Social History   Socioeconomic History   Marital status: Widowed    Spouse name: Not on file   Number of children: Not on file   Years of education: Not on file   Highest education level: Not on file  Occupational History   Not on file  Tobacco Use   Smoking status: Never    Passive exposure: Past   Smokeless tobacco: Never   Tobacco comments:    Exposed to chemical and 2nd had smoke.  Vaping Use   Vaping Use: Never used  Substance and Sexual Activity   Alcohol use: No    Alcohol/week: 0.0 standard drinks of alcohol   Drug use: No   Sexual activity: Not on file  Other Topics Concern   Not on file  Social  History Narrative   Lives in [place] with son.  never smoked; no alcohol. Used to work in [employer]    Social Determinants of Health   Financial Resource Strain: Low Risk  (12/28/2020)   Overall Financial Resource Strain (CARDIA)    Difficulty of Paying Living Expenses: Not very hard  Recent Concern: Financial Resource Strain - Medium Risk (11/02/2020)   Overall Financial Resource Strain (CARDIA)    Difficulty of Paying Living Expenses: Somewhat hard  Food Insecurity: No Food Insecurity (10/21/2020)   Hunger Vital Sign    Worried About Running Out of Food in the Last Year: Never true    Ran Out of Food in the Last Year: Never true  Transportation Needs: No Transportation Needs (10/21/2020)   PRAPARE - Hydrologist (Medical): No    Lack of Transportation (Non-Medical): No  Physical Activity:  Unknown (08/19/2018)   Exercise Vital Sign    Days of Exercise per Week: 0 days    Minutes of Exercise per Session: Not on file  Stress: No Stress Concern Present (10/21/2020)   [place]    Feeling of Stress : Not at all  Social Connections: Unknown (10/21/2020)   Social Connection and Isolation Panel [NHANES]    Frequency of Communication with Friends and Family: More than three times a week    Frequency of Social Gatherings with Friends and Family: More than three times a week    Attends Religious Services: Not on file    Active Member of [place] or Organizations: Not on file    Attends Archivist Meetings: Not on file    Marital Status: Not on file  Intimate Partner Violence: Not At Risk (10/21/2020)   Humiliation, Afraid, Rape, and Kick questionnaire    Fear of Current or Ex-Partner: No    Emotionally Abused: No    Physically Abused: No    Sexually Abused: No    Outpatient Medications Prior to Visit  Medication Sig Dispense Refill   albuterol (ACCUNEB) 0.63 MG/3ML nebulizer solution Take 3 mLs (0.63 mg total) by nebulization every 6 (six) hours as needed for wheezing. 75 mL 3   albuterol (VENTOLIN HFA) 108 (90 Base) MCG/ACT inhaler Inhale 2 puffs into the lungs every 6 (six) hours as needed for wheezing or shortness of breath. 18 g 2   apixaban (ELIQUIS) 2.5 MG TABS tablet Take 1 tablet (2.5 mg total) by mouth 2 (two) times daily. 60 tablet 3   citalopram (CELEXA) 20 MG tablet Take 1 tablet (20 mg total) by mouth daily. 90 tablet 1   rosuvastatin (CRESTOR) 10 MG tablet Take 1 tablet (10 mg total) by mouth daily. 90 tablet 3   telmisartan (MICARDIS) 40 MG tablet Take 1 tablet (40 mg total) by mouth daily. 90 tablet 1   No facility-administered medications prior to visit.    No Known Allergies  ROS     Objective:    Physical Exam  Ht '5\' 4"'$  (1.626 m)   Wt 225 lb (102.1 kg)   LMP 04/29/1997   BMI  38.62 kg/m  Wt Readings from Last 3 Encounters:  02/15/22 225 lb (102.1 kg)  01/17/22 225 lb (102.1 kg)  01/11/22 225 lb (102.1 kg)   GENERAL: alert, oriented, appears well and in no acute distress   HEENT: atraumatic, conjunttiva clear, no obvious abnormalities on inspection of external nose and ears   NECK: normal movements of the head and neck  LUNGS: on inspection no signs of respiratory distress, breathing rate appears normal, no obvious gross SOB, gasping or wheezing   CV: no obvious cyanosis   MS: moves all visible extremities without noticeable abnormality   PSYCH/NEURO: pleasant and cooperative, no obvious depression or anxiety, speech and thought processing grossly intact     Assessment & Plan:   Problem List Items Addressed This Visit       Respiratory   Respiratory failure with hypoxia ([place])    Advised patient to wear her oxygen. Instructed patient to seek in person care if oxygen is dropping below 90% or worsening shortness of breath develops.       COPD with acute exacerbation ([place]) - Primary    Due to increased cough and congestion will treat with Doxy 100 BID x 7 days and Prednisone 40 daily x 5 days. Instructed patient to use nebulizer every 6 hours for at least 2 days then she can use as needed.       Relevant Medications   predniSONE (DELTASONE) 20 MG tablet   doxycycline (VIBRA-TABS) 100 MG tablet    I am having Tina Asal. Thurmond start on predniSONE and doxycycline. I am also having her maintain her albuterol, albuterol, apixaban, citalopram, rosuvastatin, and telmisartan.  Meds ordered this encounter  Medications   predniSONE (DELTASONE) 20 MG tablet    Sig: Take 2 tablets (40 mg total) by mouth daily.    Dispense:  10 tablet    Refill:  0    Order Specific Question:   Supervising Provider    Answer:   Caryl Bis, ERIC G [[id number]]   doxycycline (VIBRA-TABS) 100 MG tablet    Sig: Take 1 tablet (100 mg total) by mouth 2 (two) times daily.    Dispense:   14 tablet    Refill:  0    Order Specific Question:   Supervising Provider    Answer:   Caryl Bis, ERIC G [[id number]]    I discussed the assessment and treatment plan with the patient. The patient was provided an opportunity to ask questions and all were answered. The patient agreed with the plan and demonstrated an understanding of the instructions.   The patient was advised to call back or seek an in-person evaluation if the symptoms worsen or if the condition fails to improve as anticipated.    Tomasita Morrow, NP [facility] [phone] (phone) [phone] (fax)  [place]

## 2022-02-20 ENCOUNTER — Telehealth: Payer: Self-pay

## 2022-02-20 NOTE — Telephone Encounter (Signed)
We received a preoperative clearance form for patient via fax from [facility].  Roetta Sessions, CMA, had already left for the day, so I put a copy of the form in Dr. Randell Patient Scott's color folder up front.

## 2022-02-21 NOTE — Telephone Encounter (Signed)
Patient scheduled for preop.

## 2022-02-21 NOTE — Telephone Encounter (Signed)
Holding pre-op form for appt.

## 2022-03-06 ENCOUNTER — Other Ambulatory Visit: Payer: Self-pay | Admitting: Internal Medicine

## 2022-03-10 ENCOUNTER — Encounter: Payer: Self-pay | Admitting: Internal Medicine

## 2022-03-10 ENCOUNTER — Ambulatory Visit (INDEPENDENT_AMBULATORY_CARE_PROVIDER_SITE_OTHER): Payer: Medicare Other | Admitting: Internal Medicine

## 2022-03-10 ENCOUNTER — Ambulatory Visit (INDEPENDENT_AMBULATORY_CARE_PROVIDER_SITE_OTHER): Payer: Medicare Other

## 2022-03-10 VITALS — BP 122/78 | HR 90 | Temp 98.0°F | Resp 16 | Ht 64.0 in | Wt 225.6 lb

## 2022-03-10 DIAGNOSIS — Z1211 Encounter for screening for malignant neoplasm of colon: Secondary | ICD-10-CM

## 2022-03-10 DIAGNOSIS — E7800 Pure hypercholesterolemia, unspecified: Secondary | ICD-10-CM

## 2022-03-10 DIAGNOSIS — J9811 Atelectasis: Secondary | ICD-10-CM | POA: Diagnosis not present

## 2022-03-10 DIAGNOSIS — J441 Chronic obstructive pulmonary disease with (acute) exacerbation: Secondary | ICD-10-CM | POA: Diagnosis not present

## 2022-03-10 DIAGNOSIS — D6851 Activated protein C resistance: Secondary | ICD-10-CM | POA: Diagnosis not present

## 2022-03-10 DIAGNOSIS — R739 Hyperglycemia, unspecified: Secondary | ICD-10-CM | POA: Diagnosis not present

## 2022-03-10 DIAGNOSIS — M5442 Lumbago with sciatica, left side: Secondary | ICD-10-CM | POA: Diagnosis not present

## 2022-03-10 DIAGNOSIS — Z86718 Personal history of other venous thrombosis and embolism: Secondary | ICD-10-CM | POA: Diagnosis not present

## 2022-03-10 DIAGNOSIS — R062 Wheezing: Secondary | ICD-10-CM | POA: Diagnosis not present

## 2022-03-10 DIAGNOSIS — I10 Essential (primary) hypertension: Secondary | ICD-10-CM

## 2022-03-10 DIAGNOSIS — I700 Atherosclerosis of aorta: Secondary | ICD-10-CM | POA: Diagnosis not present

## 2022-03-10 DIAGNOSIS — F439 Reaction to severe stress, unspecified: Secondary | ICD-10-CM

## 2022-03-10 DIAGNOSIS — R059 Cough, unspecified: Secondary | ICD-10-CM | POA: Diagnosis not present

## 2022-03-10 MED ORDER — PREDNISONE 10 MG PO TABS: ORAL_TABLET | ORAL | 0 refills | Status: DC

## 2022-03-10 MED ORDER — ROSUVASTATIN CALCIUM 10 MG PO TABS: 10.0000 mg | ORAL_TABLET | Freq: Every day | ORAL | 3 refills | Status: DC

## 2022-03-10 NOTE — Progress Notes (Unsigned)
Subjective:    [name], female    DOB: [birth year], 77 y.o.   MRN: [id number]  Patient here for  Chief Complaint  Patient presents with   Pre-op Exam    HPI Here for pre op evaluation - for colonoscopy.  Was evaluated 02/15/22 - COPD exacerbation.  Treated with doxycycline and prednisone.  She comes in today with some continued cough.  She is accompanied by her daughter.  History obtained from both of them.  Reports noticing increased sob with minimal exertion - walking to the bathroom and back.  This occurred with the latest flare/infection.  Prior to getting sick, breathing at baseline.  Per last note, pulmonary hasd cleared her for colonoscopy, but prior to having the colonoscopy - developed the increased cough and congestion. No chest pain.  On micardis for blood pressure.   Have discussed with hematology - ok to stop eliquis for colonoscopy.  Ears feel clogged.  Some decrease in hearing.  No nausea or vomiting.  No abdominal pain or bowel issue reported.    Past Medical History:  Diagnosis Date   Allergy    Asthma    Chicken pox    COPD (chronic obstructive pulmonary disease) ([place])    History of blood clots    Hypercholesterolemia    Hypertension    Retroperitoneal hematoma 03/2019   Past Surgical History:  Procedure Laterality Date   blood clots  2008   TUBAL LIGATION     Family History  Problem Relation Age of Onset   Cancer Mother        Breast   Heart disease Mother    Stroke Mother    Hypertension Mother    Breast cancer Mother        20's   Heart disease Father    Stroke Father    Hypertension Father    Diabetes Father    Colon cancer Other        paternal cousin   Social History   Socioeconomic History   Marital status: Widowed    Spouse name: Not on file   Number of children: Not on file   Years of education: Not on file   Highest education level: Not on file  Occupational History   Not on file  Tobacco Use   Smoking status: Never     Passive exposure: Past   Smokeless tobacco: Never   Tobacco comments:    Exposed to chemical and 2nd had smoke.  Vaping Use   Vaping Use: Never used  Substance and Sexual Activity   Alcohol use: No    Alcohol/week: 0.0 standard drinks of alcohol   Drug use: No   Sexual activity: Not on file  Other Topics Concern   Not on file  Social History Narrative   Lives in [place] with son.  never smoked; no alcohol. Used to work in [employer]    Social Determinants of Health   Financial Resource Strain: Low Risk  (12/28/2020)   Overall Financial Resource Strain (CARDIA)    Difficulty of Paying Living Expenses: Not very hard  Recent Concern: Financial Resource Strain - Medium Risk (11/02/2020)   Overall Financial Resource Strain (CARDIA)    Difficulty of Paying Living Expenses: Somewhat hard  Food Insecurity: No Food Insecurity (10/21/2020)   Hunger Vital Sign    Worried About Running Out of Food in the Last Year: Never true    Ran Out of Food in the Last Year: Never true  Transportation  Needs: No Transportation Needs (10/21/2020)   PRAPARE - Hydrologist (Medical): No    Lack of Transportation (Non-Medical): No  Physical Activity: Unknown (08/19/2018)   Exercise Vital Sign    Days of Exercise per Week: 0 days    Minutes of Exercise per Session: Not on file  Stress: No Stress Concern Present (10/21/2020)   [place]    Feeling of Stress : Not at all  Social Connections: Unknown (10/21/2020)   Social Connection and Isolation Panel [NHANES]    Frequency of Communication with Friends and Family: More than three times a week    Frequency of Social Gatherings with Friends and Family: More than three times a week    Attends Religious Services: Not on Advertising copywriter or Organizations: Not on file    Attends Archivist Meetings: Not on file    Marital Status: Not on  file     Review of Systems  Constitutional:  Negative for appetite change, fever and unexpected weight change.  HENT:  Positive for congestion. Negative for sinus pressure.   Respiratory:  Positive for cough and shortness of breath. Negative for chest tightness.   Cardiovascular:  Negative for chest pain and palpitations.       No increased swelling.   Gastrointestinal:  Negative for abdominal pain, diarrhea, nausea and vomiting.  Genitourinary:  Negative for difficulty urinating and dysuria.  Musculoskeletal:  Negative for joint swelling and myalgias.  Skin:  Negative for color change and rash.  Neurological:  Negative for dizziness and headaches.  Psychiatric/Behavioral:  Negative for agitation and dysphoric mood.        Objective:     BP 122/78   Pulse 90   Temp 98 F (36.7 C)   Resp 16   Ht 5' 4"$  (1.626 m)   Wt 225 lb 9.6 oz (102.3 kg)   LMP 04/29/1997   SpO2 97%   BMI 38.72 kg/m  Wt Readings from Last 3 Encounters:  03/10/22 225 lb 9.6 oz (102.3 kg)  02/15/22 225 lb (102.1 kg)  01/17/22 225 lb (102.1 kg)    Physical Exam Vitals reviewed.  Constitutional:      General: She is not in acute distress.    Appearance: Normal appearance.  HENT:     Head: Normocephalic and atraumatic.     Right Ear: External ear normal.     Left Ear: External ear normal.  Eyes:     General: No scleral icterus.       Right eye: No discharge.        Left eye: No discharge.     Conjunctiva/sclera: Conjunctivae normal.  Neck:     Thyroid: No thyromegaly.  Cardiovascular:     Rate and Rhythm: Normal rate and regular rhythm.  Pulmonary:     Comments: Increased cough and wheezing with forced expiration.  Increased air movement.  Abdominal:     General: Bowel sounds are normal.     Palpations: Abdomen is soft.     Tenderness: There is no abdominal tenderness.  Musculoskeletal:        General: No swelling or tenderness.     Cervical back: Neck supple. No tenderness.   Lymphadenopathy:     Cervical: No cervical adenopathy.  Skin:    Findings: No erythema or rash.  Neurological:     Mental Status: She is alert.  Psychiatric:  Mood and Affect: Mood normal.        Behavior: Behavior normal.      Outpatient Encounter Medications as of 03/10/2022  Medication Sig   predniSONE (DELTASONE) 10 MG tablet Take 6 tablets x 1 day and then decrease by 1/2 tablet per day until down to zero mg.   albuterol (ACCUNEB) 0.63 MG/3ML nebulizer solution Take 3 mLs (0.63 mg total) by nebulization every 6 (six) hours as needed for wheezing.   albuterol (VENTOLIN HFA) 108 (90 Base) MCG/ACT inhaler Inhale 2 puffs into the lungs every 6 (six) hours as needed for wheezing or shortness of breath.   citalopram (CELEXA) 20 MG tablet Take 1 tablet (20 mg total) by mouth daily.   doxycycline (VIBRA-TABS) 100 MG tablet Take 1 tablet (100 mg total) by mouth 2 (two) times daily.   ELIQUIS 2.5 MG TABS tablet TAKE 1 TABLET BY MOUTH TWICE A DAY   rosuvastatin (CRESTOR) 10 MG tablet Take 1 tablet (10 mg total) by mouth daily.   telmisartan (MICARDIS) 40 MG tablet Take 1 tablet (40 mg total) by mouth daily.   [DISCONTINUED] predniSONE (DELTASONE) 20 MG tablet Take 2 tablets (40 mg total) by mouth daily.   [DISCONTINUED] rosuvastatin (CRESTOR) 10 MG tablet Take 1 tablet (10 mg total) by mouth daily.   No facility-administered encounter medications on file as of 03/10/2022.     Lab Results  Component Value Date   WBC 8.2 01/11/2022   HGB 14.2 01/11/2022   HCT 42.8 01/11/2022   PLT 269.0 01/11/2022   GLUCOSE 95 01/11/2022   CHOL 158 01/11/2022   TRIG 101.0 01/11/2022   HDL 53.70 01/11/2022   LDLDIRECT 89.0 09/28/2020   LDLCALC 84 01/11/2022   ALT 11 01/11/2022   AST 16 01/11/2022   NA 141 01/11/2022   K 4.6 01/11/2022   CL 106 01/11/2022   CREATININE 1.04 01/11/2022   BUN 17 01/11/2022   CO2 26 01/11/2022   TSH 4.43 01/11/2022   INR 1.2 04/23/2019   HGBA1C 6.1 01/11/2022     DG PAIN CLINIC C-ARM 1-60 MIN NO REPORT  Result Date: 01/17/2022 Fluoro was used, but no Radiologist interpretation will be provided. Please refer to "NOTES" tab for provider progress note.      Assessment & Plan:  Cough, unspecified type Assessment & Plan: Recently diagnosed and treated for COPD exacerbation.  With some persistent cough and sob with exertion.  Exam as outlined.  Check cxr.  Hold abx.  Will continue inhaler.  Prednisone taper as directed.  Follow.  Call with update. Will need to clear prior to colonoscopy.    Orders: -     DG Chest 2 View; Future  Aortic atherosclerosis ([place]) Assessment & Plan: Continue crestor.    Colon cancer screening Assessment & Plan: Father's side of family with history of colon cancer.  Saw GI.  Was scheduled for colonoscopy.  Was put on hold until dizziness resolved and acute infection cleared. Dizziness resolved.  Breathing better.  No increased cough or congestion. Pulmonary cleared.  Was scheduled for colonoscopy - next week.  Now with increased cough, congestion and wheezing with associated sob with exertion.  Will need to treat acute flare. Will have to postpone colonoscopy until breathing back to baseline.    COPD with acute exacerbation [facility]) Assessment & Plan: Recently diagnosed and treated for COPD exacerbation.  With some persistent cough and sob with exertion.  Exam as outlined.  Check cxr.  Hold abx.  Will continue inhaler.  Prednisone taper as directed.  Follow.  Call with update. Will need to clear prior to colonoscopy.     Essential hypertension, benign Assessment & Plan:  On micardis 91m q day.  Follow pressures.  Follow metabolic panel.    Factor V Leiden ([facility] Assessment & Plan: On eliquis.  Discussed with hematology - ok to stop for colonoscopy.     History of blood clots Assessment & Plan: Continue eliquis.  Followed by hematology.    Hypercholesterolemia Assessment & Plan: Continue crestor.  Low  cholesterol diet and exercise.  Follow lipid panel and liver function tests.     Hyperglycemia Assessment & Plan: Low carb diet and exercise.  Follow met b and a1c.    Left-sided low back pain with left-sided sciatica, unspecified chronicity Assessment & Plan: Followed by pain clinic.  Dr AAndree ElkESI (11/16/21)   Stress Assessment & Plan: Continues on citalopram.  Overall appears to be handling things well.  Follow.    Other orders -     predniSONE; Take 6 tablets x 1 day and then decrease by 1/2 tablet per day until down to zero mg.  Dispense: 39 tablet; Refill: 0 -     Rosuvastatin Calcium; Take 1 tablet (10 mg total) by mouth daily.  Dispense: 90 tablet; Refill: 3     CEinar Pheasant MD

## 2022-03-12 ENCOUNTER — Encounter: Payer: Self-pay | Admitting: Internal Medicine

## 2022-03-12 DIAGNOSIS — J449 Chronic obstructive pulmonary disease, unspecified: Secondary | ICD-10-CM | POA: Diagnosis not present

## 2022-03-12 NOTE — Assessment & Plan Note (Signed)
On micardis 2m q day.  Follow pressures.  Follow metabolic panel.

## 2022-03-12 NOTE — Assessment & Plan Note (Signed)
Father's side of family with history of colon cancer.  Saw GI.  Was scheduled for colonoscopy.  Was put on hold until dizziness resolved and acute infection cleared. Dizziness resolved.  Breathing better.  No increased cough or congestion. Pulmonary cleared.  Was scheduled for colonoscopy - next week.  Now with increased cough, congestion and wheezing with associated sob with exertion.  Will need to treat acute flare. Will have to postpone colonoscopy until breathing back to baseline.

## 2022-03-12 NOTE — Assessment & Plan Note (Signed)
Followed by pain clinic.  Dr Andree Elk ESI (11/16/21)

## 2022-03-12 NOTE — Assessment & Plan Note (Signed)
Low carb diet and exercise.  Follow met b and a1c.   

## 2022-03-12 NOTE — Assessment & Plan Note (Signed)
Continue crestor 

## 2022-03-12 NOTE — Assessment & Plan Note (Signed)
Continue eliquis.  Followed by hematology.

## 2022-03-12 NOTE — Assessment & Plan Note (Signed)
On eliquis.  Discussed with hematology - ok to stop for colonoscopy.

## 2022-03-12 NOTE — Assessment & Plan Note (Signed)
Continue crestor.  Low cholesterol diet and exercise. Follow lipid panel and liver function tests.   

## 2022-03-12 NOTE — Assessment & Plan Note (Signed)
Recently diagnosed and treated for COPD exacerbation.  With some persistent cough and sob with exertion.  Exam as outlined.  Check cxr.  Hold abx.  Will continue inhaler.  Prednisone taper as directed.  Follow.  Call with update. Will need to clear prior to colonoscopy.

## 2022-03-12 NOTE — Assessment & Plan Note (Signed)
Continues on citalopram.  Overall appears to be handling things well.  Follow.

## 2022-03-15 ENCOUNTER — Ambulatory Visit: Admission: RE | Admit: 2022-03-15 | Payer: Medicare Other | Source: Home / Self Care | Admitting: Internal Medicine

## 2022-03-15 ENCOUNTER — Encounter: Admission: RE | Payer: Self-pay | Source: Home / Self Care

## 2022-03-15 SURGERY — COLONOSCOPY
Anesthesia: General

## 2022-04-07 ENCOUNTER — Encounter: Payer: Self-pay | Admitting: Internal Medicine

## 2022-04-07 ENCOUNTER — Ambulatory Visit: Payer: Medicare Other | Admitting: Internal Medicine

## 2022-04-07 VITALS — BP 126/80 | HR 85 | Temp 97.8°F | Ht 64.0 in | Wt 226.4 lb

## 2022-04-07 DIAGNOSIS — J441 Chronic obstructive pulmonary disease with (acute) exacerbation: Secondary | ICD-10-CM

## 2022-04-07 NOTE — Assessment & Plan Note (Addendum)
Never smoker but heavy exp to cigs and indoor fires as child  - Spirometry 03/20/2018  FEV1 1.4 (65%)  Ratio 0.68  Min concavity   She really has very little copd and more limited by back at this point so ok to continue albuterol prn with stipulation since she may have AB component we could chose to treat her like a mild asthmatic with saba/budesonide 0.25 mg just the way AirSupra is now being used (at a fraction of the cost)   Can also use zyrtec prn for pnds and hard rock candy to suppress the urge to cough/ advised  >>>>   Pulmonary f/u is prn      Each maintenance medication was reviewed in detail including emphasizing most importantly the difference between maintenance and prns and under what circumstances the prns are to be triggered using an action plan format where appropriate.  Total time for H and P, chart review, counseling, reviewing hfa/neb  device(s) and generating customized AVS unique to this office visit / same day charting = 31 min final summary f/u ov

## 2022-04-07 NOTE — Patient Instructions (Addendum)
Best choice for nasal allergies is Zyrtec 10 mg as needed   Ok to use hard rock candy to prevent throat clearing like jolley Scientist, research (medical) but not the white ones - avoid mint and menthol products    If start needing more albuterol than usual, call me for a new additive for your albuterol nebulizer    If you are satisfied with your treatment plan,  let your doctor know and he/she can either refill your medications or you can return here when your prescription runs out.     If in any way you are not 100% satisfied,  please tell us.  If 100% better, tell your friends!  Pulmonary follow up is as needed

## 2022-04-07 NOTE — Progress Notes (Signed)
Tina Duarte, female    DOB: 02/17/1945   MRN: [id number]   Brief patient profile:  14  yowf never smoker with lots of secondary exp including home/work and wood fire exp for cooking and cleaning   with dx  GOLD 2 COPD  2020 by Kasa  referred to pulmonary clinic in [facility]  08/25/2021 by Dr Einar Pheasant      History of Present Illness  08/25/2021  Pulmonary/ 1st office eval/ Tina Duarte / [facility] / not really on any maint rx at baseline  Chief Complaint  Patient presents with   Consult    Has COPD, since 2021.  Wears 1L of oxygen at HS and as need.  Does not have portable oxygen.  Going to have have a colonoscopy, needs clearance.   Dyspnea:  limited by back pain  up slt hill back to house from car on her own and uses walker inside  Cough: flared up 07/21/21 while on no maint rx (wasfine for many months to sev years) saw PCP  07/28/21   sinusitis/ bronchitis dx >>>L rx  omnicef / pred and resumed alb 2-3 per day and doesn't think she needs it now/  Sleep: bed is flat wedge pillow / 1 lpm hs with pnds hs some better on benadry  SABA use: neb 3 h prior Rec Change your nebulizer to where you take it up to every 4 hours as needed if you can't catch your breath Ok to try albuterol 15 min before an activity (on alternating days)  that you know would usually make you short of breath  Take the nebulizer right before you leave the house for your colonoscopy For drainage / throat tickle try take CHLORPHENIRAMINE  4 mg   If you start needing your nebulizer more than twice a day you will need to be seen here or Dr Bary Leriche office  Pulmonary clinic follow up is as needed   04/07/2022  f/u ov/Tina Duarte/ [facility] re: GOLD 2/ AB   maint on prn saba   Chief Complaint  Patient presents with   Follow-up    SOB with exertion. No wheezing or cough.   Dyspnea:  back is limiting before breathing  Cough: none  Sleeping: bed if flat/ wedge pillow  SABA use: not much at all  02: 1  lpm hs and prn  daytime      No obvious day to day or daytime variability or assoc excess/ purulent sputum or mucus plugs or hemoptysis or cp or chest tightness, subjective wheeze or overt sinus or hb symptoms.   Sleeping  without nocturnal  or early am exacerbation  of respiratory  c/o's or need for noct saba. Also denies any obvious fluctuation of symptoms with weather or environmental changes or other aggravating or alleviating factors except as outlined above   No unusual exposure hx or h/o childhood pna/ asthma or knowledge of premature birth.  Current Allergies, Complete Past Medical History, Past Surgical History, Family History, and Social History were reviewed in [employer] record.  ROS  The following are not active complaints unless bolded Hoarseness, sore throat, dysphagia, dental problems, itching, sneezing,  nasal congestion or discharge of excess mucus or purulent secretions, ear ache,   fever, chills, sweats, unintended wt loss or wt gain, classically pleuritic or exertional cp,  orthopnea pnd or arm/hand swelling  or leg swelling, presyncope, palpitations, abdominal pain, anorexia, nausea, vomiting, diarrhea  or change in bowel habits or change in  bladder habits, change in stools or change in urine, dysuria, hematuria,  rash, arthralgias, visual complaints, headache, numbness, weakness or ataxia or problems with walking or coordination,  change in mood or  memory.        Current Meds  Medication Sig   albuterol (ACCUNEB) 0.63 MG/3ML nebulizer solution Take 3 mLs (0.63 mg total) by nebulization every 6 (six) hours as needed for wheezing.   albuterol (VENTOLIN HFA) 108 (90 Base) MCG/ACT inhaler Inhale 2 puffs into the lungs every 6 (six) hours as needed for wheezing or shortness of breath.   citalopram (CELEXA) 20 MG tablet Take 1 tablet (20 mg total) by mouth daily.   ELIQUIS 2.5 MG TABS tablet TAKE 1 TABLET BY MOUTH TWICE A DAY   rosuvastatin (CRESTOR) 10 MG tablet Take  1 tablet (10 mg total) by mouth daily.   telmisartan (MICARDIS) 40 MG tablet Take 1 tablet (40 mg total) by mouth daily.                     Past Medical History:  Diagnosis Date   Allergy    Asthma    Chicken pox    COPD (chronic obstructive pulmonary disease) ([place])    History of blood clots    Hypercholesterolemia    Hypertension    Retroperitoneal hematoma 03/2019       Objective:     Wt Readings from Last 3 Encounters:  04/07/22 226 lb 6.4 oz (102.7 kg)  03/10/22 225 lb 9.6 oz (102.3 kg)  02/15/22 225 lb (102.1 kg)     Vital signs reviewed  04/07/2022  - Note at rest 02 sats  95% on RA   General appearance:    hoarse mod obese am wf using rollator      HEENT : Oropharynx  clear , occ throat clearing    NECK :  without  apparent JVD/ palpable Nodes/TM    LUNGS: no acc muscle use,  Min barrel  contour chest wall with bilateral  slightly decreased bs s audible wheeze and  without cough on insp or exp maneuvers and min  Hyperresonant  to  percussion bilaterally    CV:  RRR  no s3 or murmur or increase in P2, and no edema   ABD:  soft and nontender with pos end  insp Hoover's  in the supine position.  No bruits or organomegaly appreciated   MS:  Nl gait/ ext warm without deformities Or obvious joint restrictions  calf tenderness, cyanosis or clubbing     SKIN: warm and dry without lesions    NEURO:  alert, approp, nl sensorium with  no motor or cerebellar deficits apparent.            Assessment

## 2022-04-10 DIAGNOSIS — J449 Chronic obstructive pulmonary disease, unspecified: Secondary | ICD-10-CM | POA: Diagnosis not present

## 2022-04-13 ENCOUNTER — Ambulatory Visit (INDEPENDENT_AMBULATORY_CARE_PROVIDER_SITE_OTHER): Payer: Medicare Other | Admitting: Internal Medicine

## 2022-04-13 ENCOUNTER — Encounter: Payer: Self-pay | Admitting: Internal Medicine

## 2022-04-13 VITALS — BP 122/70 | HR 90 | Temp 98.2°F | Resp 16 | Ht 64.0 in | Wt 224.0 lb

## 2022-04-13 DIAGNOSIS — D6851 Activated protein C resistance: Secondary | ICD-10-CM | POA: Diagnosis not present

## 2022-04-13 DIAGNOSIS — R739 Hyperglycemia, unspecified: Secondary | ICD-10-CM

## 2022-04-13 DIAGNOSIS — I10 Essential (primary) hypertension: Secondary | ICD-10-CM

## 2022-04-13 DIAGNOSIS — F439 Reaction to severe stress, unspecified: Secondary | ICD-10-CM

## 2022-04-13 DIAGNOSIS — R42 Dizziness and giddiness: Secondary | ICD-10-CM | POA: Diagnosis not present

## 2022-04-13 DIAGNOSIS — Z1211 Encounter for screening for malignant neoplasm of colon: Secondary | ICD-10-CM

## 2022-04-13 DIAGNOSIS — D6859 Other primary thrombophilia: Secondary | ICD-10-CM

## 2022-04-13 DIAGNOSIS — M25511 Pain in right shoulder: Secondary | ICD-10-CM

## 2022-04-13 DIAGNOSIS — M25512 Pain in left shoulder: Secondary | ICD-10-CM

## 2022-04-13 DIAGNOSIS — I700 Atherosclerosis of aorta: Secondary | ICD-10-CM

## 2022-04-13 DIAGNOSIS — E7800 Pure hypercholesterolemia, unspecified: Secondary | ICD-10-CM

## 2022-04-13 DIAGNOSIS — J4520 Mild intermittent asthma, uncomplicated: Secondary | ICD-10-CM

## 2022-04-13 DIAGNOSIS — M25519 Pain in unspecified shoulder: Secondary | ICD-10-CM | POA: Insufficient documentation

## 2022-04-13 LAB — HEPATIC FUNCTION PANEL
ALT: 9 U/L (ref 0–35)
AST: 15 U/L (ref 0–37)
Albumin: 4.1 g/dL (ref 3.5–5.2)
Alkaline Phosphatase: 65 U/L (ref 39–117)
Bilirubin, Direct: 0.1 mg/dL (ref 0.0–0.3)
Total Bilirubin: 0.7 mg/dL (ref 0.2–1.2)
Total Protein: 7.1 g/dL (ref 6.0–8.3)

## 2022-04-13 LAB — BASIC METABOLIC PANEL
BUN: 12 mg/dL (ref 6–23)
CO2: 27 mEq/L (ref 19–32)
Calcium: 9.4 mg/dL (ref 8.4–10.5)
Chloride: 104 mEq/L (ref 96–112)
Creatinine, Ser: 1.03 mg/dL (ref 0.40–1.20)
GFR: 52.08 mL/min — ABNORMAL LOW (ref >60.00)
Glucose, Bld: 86 mg/dL (ref 70–99)
Potassium: 4.6 mEq/L (ref 3.5–5.1)
Sodium: 141 mEq/L (ref 135–145)

## 2022-04-13 LAB — LIPID PANEL
Cholesterol: 161 mg/dL (ref 0–200)
HDL: 49.6 mg/dL (ref >39.00)
NonHDL: 111.49
Total CHOL/HDL Ratio: 3
Triglycerides: 239 mg/dL — ABNORMAL HIGH (ref 0.0–149.0)
VLDL: 47.8 mg/dL — ABNORMAL HIGH (ref 0.0–40.0)

## 2022-04-13 LAB — HEMOGLOBIN A1C: Hgb A1c MFr Bld: 5.9 % (ref 4.6–6.5)

## 2022-04-13 LAB — TSH: TSH: 3.29 u[IU]/mL (ref 0.35–5.50)

## 2022-04-13 LAB — SEDIMENTATION RATE: Sed Rate: 16 mm/hr (ref 0–30)

## 2022-04-13 LAB — LDL CHOLESTEROL, DIRECT: Direct LDL: 81 mg/dL

## 2022-04-13 NOTE — Progress Notes (Signed)
Subjective:    [name], female    DOB: 1945/08/11, 77 y.o.   MRN: [id number]  Patient here for  Chief Complaint  Patient presents with   Medical Management of Chronic Issues    HPI Here to follow up regarding hypercholesterolemia, hypertension, hyperglycemia and COPD.  Reports she is doing relatively well.  Increased shoulder and neck pain.  Some low back pain.  No radiation.  Followed by pain clinic.  No increased cough or congestion.  No chest pain reported.  No abdominal pain or bowel change reported.  Discussed colonoscopy.     Past Medical History:  Diagnosis Date   Allergy    Asthma    Chicken pox    COPD (chronic obstructive pulmonary disease) ([place])    History of blood clots    Hypercholesterolemia    Hypertension    Retroperitoneal hematoma 03/2019   Past Surgical History:  Procedure Laterality Date   blood clots  2008   TUBAL LIGATION     Family History  Problem Relation Age of Onset   Cancer Mother        Breast   Heart disease Mother    Stroke Mother    Hypertension Mother    Breast cancer Mother        56's   Heart disease Father    Stroke Father    Hypertension Father    Diabetes Father    Colon cancer Other        paternal cousin   Social History   Socioeconomic History   Marital status: Widowed    Spouse name: Not on file   Number of children: Not on file   Years of education: Not on file   Highest education level: Not on file  Occupational History   Not on file  Tobacco Use   Smoking status: Never    Passive exposure: Past   Smokeless tobacco: Never   Tobacco comments:    Exposed to chemical and 2nd had smoke.  Vaping Use   Vaping Use: Never used  Substance and Sexual Activity   Alcohol use: No    Alcohol/week: 0.0 standard drinks of alcohol   Drug use: No   Sexual activity: Not on file  Other Topics Concern   Not on file  Social History Narrative   Lives in [place] with son.  never smoked; no alcohol. Used  to work in [employer]    Social Determinants of Health   Financial Resource Strain: Low Risk  (12/28/2020)   Overall Financial Resource Strain (CARDIA)    Difficulty of Paying Living Expenses: Not very hard  Recent Concern: Financial Resource Strain - Medium Risk (11/02/2020)   Overall Financial Resource Strain (CARDIA)    Difficulty of Paying Living Expenses: Somewhat hard  Food Insecurity: No Food Insecurity (10/21/2020)   Hunger Vital Sign    Worried About Running Out of Food in the Last Year: Never true    Ran Out of Food in the Last Year: Never true  Transportation Needs: No Transportation Needs (10/21/2020)   PRAPARE - Hydrologist (Medical): No    Lack of Transportation (Non-Medical): No  Physical Activity: Unknown (08/19/2018)   Exercise Vital Sign    Days of Exercise per Week: 0 days    Minutes of Exercise per Session: Not on file  Stress: No Stress Concern Present (10/21/2020)   [place]    Feeling  of Stress : Not at all  Social Connections: Unknown (10/21/2020)   Social Connection and Isolation Panel [NHANES]    Frequency of Communication with Friends and Family: More than three times a week    Frequency of Social Gatherings with Friends and Family: More than three times a week    Attends Religious Services: Not on Advertising copywriter or Organizations: Not on file    Attends Archivist Meetings: Not on file    Marital Status: Not on file     Review of Systems  Constitutional:  Negative for appetite change and unexpected weight change.  HENT:  Negative for congestion and sinus pressure.   Respiratory:  Negative for chest tightness.        Breathing stable.  No increased cough.   Cardiovascular:  Negative for chest pain and palpitations.  Gastrointestinal:  Negative for abdominal pain, diarrhea, nausea and vomiting.  Genitourinary:  Negative for  difficulty urinating and dysuria.  Musculoskeletal:  Positive for back pain and neck pain. Negative for joint swelling and myalgias.  Skin:  Negative for color change and rash.  Neurological:  Negative for dizziness and headaches.  Psychiatric/Behavioral:  Negative for agitation and dysphoric mood.        Objective:     BP 122/70   Pulse 90   Temp 98.2 F (36.8 C)   Resp 16   Ht 5\' 4"  (1.626 m)   Wt 224 lb (101.6 kg)   LMP 04/29/1997   SpO2 98%   BMI 38.45 kg/m  Wt Readings from Last 3 Encounters:  04/13/22 224 lb (101.6 kg)  04/07/22 226 lb 6.4 oz (102.7 kg)  03/10/22 225 lb 9.6 oz (102.3 kg)    Physical Exam Vitals reviewed.  Constitutional:      General: She is not in acute distress.    Appearance: Normal appearance.  HENT:     Head: Normocephalic and atraumatic.     Right Ear: External ear normal.     Left Ear: External ear normal.  Eyes:     General: No scleral icterus.       Right eye: No discharge.        Left eye: No discharge.     Conjunctiva/sclera: Conjunctivae normal.  Neck:     Thyroid: No thyromegaly.  Cardiovascular:     Rate and Rhythm: Normal rate and regular rhythm.  Pulmonary:     Effort: No respiratory distress.     Breath sounds: Normal breath sounds. No wheezing.  Abdominal:     General: Bowel sounds are normal.     Palpations: Abdomen is soft.     Tenderness: There is no abdominal tenderness.  Musculoskeletal:        General: No swelling or tenderness.     Cervical back: Neck supple. No tenderness.  Lymphadenopathy:     Cervical: No cervical adenopathy.  Skin:    Findings: No erythema or rash.  Neurological:     Mental Status: She is alert.  Psychiatric:        Mood and Affect: Mood normal.        Behavior: Behavior normal.      Outpatient Encounter Medications as of 04/13/2022  Medication Sig   albuterol (ACCUNEB) 0.63 MG/3ML nebulizer solution Take 3 mLs (0.63 mg total) by nebulization every 6 (six) hours as needed for  wheezing.   albuterol (VENTOLIN HFA) 108 (90 Base) MCG/ACT inhaler Inhale 2 puffs into the lungs every 6 (  six) hours as needed for wheezing or shortness of breath.   citalopram (CELEXA) 20 MG tablet Take 1 tablet (20 mg total) by mouth daily.   ELIQUIS 2.5 MG TABS tablet TAKE 1 TABLET BY MOUTH TWICE A DAY   rosuvastatin (CRESTOR) 10 MG tablet Take 1 tablet (10 mg total) by mouth daily.   telmisartan (MICARDIS) 40 MG tablet Take 1 tablet (40 mg total) by mouth daily.   [DISCONTINUED] doxycycline (VIBRA-TABS) 100 MG tablet Take 1 tablet (100 mg total) by mouth 2 (two) times daily. (Patient not taking: Reported on 04/07/2022)   [DISCONTINUED] predniSONE (DELTASONE) 10 MG tablet Take 6 tablets x 1 day and then decrease by 1/2 tablet per day until down to zero mg. (Patient not taking: Reported on 04/07/2022)   No facility-administered encounter medications on file as of 04/13/2022.     Lab Results  Component Value Date   WBC 8.2 01/11/2022   HGB 14.2 01/11/2022   HCT 42.8 01/11/2022   PLT 269.0 01/11/2022   GLUCOSE 86 04/13/2022   CHOL 161 04/13/2022   TRIG 239.0 (H) 04/13/2022   HDL 49.60 04/13/2022   LDLDIRECT 81.0 04/13/2022   LDLCALC 84 01/11/2022   ALT 9 04/13/2022   AST 15 04/13/2022   NA 141 04/13/2022   K 4.6 04/13/2022   CL 104 04/13/2022   CREATININE 1.03 04/13/2022   BUN 12 04/13/2022   CO2 27 04/13/2022   TSH 3.29 04/13/2022   INR 1.2 04/23/2019   HGBA1C 5.9 04/13/2022    DG PAIN CLINIC C-ARM 1-60 MIN NO REPORT  Result Date: 01/17/2022 Fluoro was used, but no Radiologist interpretation will be provided. Please refer to "NOTES" tab for provider progress note.      Assessment & Plan:  Essential hypertension, benign Assessment & Plan:  On micardis 40mg  q day.  Follow pressures.  Follow metabolic panel.   Orders: -     Basic metabolic panel  Hyperglycemia Assessment & Plan: Low carb diet and exercise.  Follow met b and a1c.   Orders: -     Hemoglobin  A1c  Hypercholesterolemia Assessment & Plan: Continue crestor.  Low cholesterol diet and exercise.  Follow lipid panel and liver function tests.    Orders: -     Hepatic function panel -     Lipid panel -     TSH  Bilateral shoulder pain, unspecified chronicity -     Sedimentation rate  Aortic atherosclerosis (HCC) Assessment & Plan: Continue crestor.    Mild intermittent asthma without complication Assessment & Plan: Breathing stable.  Continues nebulizer.  Just saw Dr Melvyn Novas.  Stable. Follow.    Colon cancer screening Assessment & Plan: Father's side of family with history of colon cancer.  Saw GI.  Was scheduled for colonoscopy.  Was put on hold until dizziness resolved and acute infection cleared. Dizziness resolved.  Breathing better.  No increased cough or congestion. Pulmonary cleared.  Was scheduled again for colonoscopy.  Had to be postponed due to increased cough, congestion and wheezing with associated sob with exertion. Breathing back to baseline now.  Discussed colonoscopy with her.  Wanted to hold right now.  Follow.  Contact when ready to schedule.    Dizziness Assessment & Plan: No dizziness reported.  Follow.    Factor V Leiden [facility]) Assessment & Plan: On eliquis.  Discussed with hematology - ok to stop for colonoscopy.     Primary hypercoagulable state [facility]) Assessment & Plan: Continue eliquis as outlined.  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  Recommended indefinite anticoagulation.     Stress Assessment & Plan: Continues on citalopram.  Overall appears to be handling things well.  Follow.    Other orders -     LDL cholesterol, direct     Einar Pheasant, MD

## 2022-04-14 ENCOUNTER — Telehealth: Payer: Self-pay | Admitting: *Deleted

## 2022-04-14 NOTE — Telephone Encounter (Signed)
Voicemail full patient calls please read results message.

## 2022-04-14 NOTE — Telephone Encounter (Signed)
-----   Message from Einar Pheasant, MD sent at 04/14/2022  7:06 AM EDT ----- Please call and notify Ms Sada that her kidney function is stable.  Triglycerides increased form last check, but the remainder of the cholesterol panel is relatively stable.  Continue low carb diet and exercise.  We will follow.  Overall sugar control improved from last check.  The inflammation marker, thyroid test and liver function tests are wnl.

## 2022-04-23 ENCOUNTER — Encounter: Payer: Self-pay | Admitting: Internal Medicine

## 2022-04-23 NOTE — Assessment & Plan Note (Signed)
Continue crestor 

## 2022-04-23 NOTE — Assessment & Plan Note (Signed)
Continues on citalopram.  Overall appears to be handling things well.  Follow.  

## 2022-04-23 NOTE — Assessment & Plan Note (Signed)
On eliquis.  Discussed with hematology - ok to stop for colonoscopy.   

## 2022-04-23 NOTE — Assessment & Plan Note (Signed)
Continue eliquis as outlined.  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  Recommended indefinite anticoagulation.   

## 2022-04-23 NOTE — Assessment & Plan Note (Signed)
No dizziness reported.  Follow.

## 2022-04-23 NOTE — Assessment & Plan Note (Signed)
On micardis 40mg q day.  Follow pressures.  Follow metabolic panel.  

## 2022-04-23 NOTE — Assessment & Plan Note (Signed)
Father's side of family with history of colon cancer.  Saw GI.  Was scheduled for colonoscopy.  Was put on hold until dizziness resolved and acute infection cleared. Dizziness resolved.  Breathing better.  No increased cough or congestion. Pulmonary cleared.  Was scheduled again for colonoscopy.  Had to be postponed due to increased cough, congestion and wheezing with associated sob with exertion. Breathing back to baseline now.  Discussed colonoscopy with her.  Wanted to hold right now.  Follow.  Contact when ready to schedule.

## 2022-04-23 NOTE — Assessment & Plan Note (Signed)
Breathing stable.  Continues nebulizer.  Just saw Dr Melvyn Novas.  Stable. Follow.

## 2022-04-23 NOTE — Assessment & Plan Note (Signed)
Low carb diet and exercise.  Follow met b and a1c.   

## 2022-04-23 NOTE — Assessment & Plan Note (Signed)
Continue crestor.  Low cholesterol diet and exercise. Follow lipid panel and liver function tests.   

## 2022-04-25 NOTE — Telephone Encounter (Signed)
Voicemail full.

## 2022-04-25 NOTE — Telephone Encounter (Signed)
Mychart sent to patient.

## 2022-05-02 ENCOUNTER — Ambulatory Visit
Admission: RE | Admit: 2022-05-02 | Discharge: 2022-05-02 | Disposition: A | Discharge status: Home / Self Care | Payer: Medicare Other | Source: Ambulatory Visit | Attending: Anesthesiology | Admitting: Anesthesiology

## 2022-05-02 ENCOUNTER — Encounter: Payer: Self-pay | Admitting: Anesthesiology

## 2022-05-02 ENCOUNTER — Ambulatory Visit (HOSPITAL_BASED_OUTPATIENT_CLINIC_OR_DEPARTMENT_OTHER): Payer: Medicare Other | Admitting: Anesthesiology

## 2022-05-02 ENCOUNTER — Other Ambulatory Visit: Payer: Self-pay | Admitting: Anesthesiology

## 2022-05-02 VITALS — BP 151/95 | HR 90 | Temp 96.9°F | Resp 15 | Ht 64.0 in | Wt 224.0 lb

## 2022-05-02 DIAGNOSIS — M25551 Pain in right hip: Secondary | ICD-10-CM | POA: Insufficient documentation

## 2022-05-02 DIAGNOSIS — M47816 Spondylosis without myelopathy or radiculopathy, lumbar region: Secondary | ICD-10-CM | POA: Insufficient documentation

## 2022-05-02 DIAGNOSIS — M48062 Spinal stenosis, lumbar region with neurogenic claudication: Secondary | ICD-10-CM | POA: Insufficient documentation

## 2022-05-02 DIAGNOSIS — M5136 Other intervertebral disc degeneration, lumbar region: Secondary | ICD-10-CM | POA: Diagnosis not present

## 2022-05-02 DIAGNOSIS — M1711 Unilateral primary osteoarthritis, right knee: Secondary | ICD-10-CM | POA: Insufficient documentation

## 2022-05-02 DIAGNOSIS — G894 Chronic pain syndrome: Secondary | ICD-10-CM

## 2022-05-02 DIAGNOSIS — M25552 Pain in left hip: Secondary | ICD-10-CM

## 2022-05-02 MED ORDER — TRAMADOL HCL 50 MG PO TABS: 50.0000 mg | ORAL_TABLET | Freq: Three times a day (TID) | ORAL | 1 refills | Status: DC

## 2022-05-02 MED ORDER — ROPIVACAINE HCL 2 MG/ML IJ SOLN
INTRAMUSCULAR | Status: AC
  Filled 2022-05-02: qty 20

## 2022-05-02 MED ORDER — LIDOCAINE HCL (PF) 1 % IJ SOLN
5.0000 mL | Freq: Once | INTRAMUSCULAR | Status: AC
  Administered 2022-05-02: 5 mL via SUBCUTANEOUS

## 2022-05-02 MED ORDER — IOHEXOL 180 MG/ML  SOLN
INTRAMUSCULAR | Status: AC
  Filled 2022-05-02: qty 20

## 2022-05-02 MED ORDER — TRIAMCINOLONE ACETONIDE 40 MG/ML IJ SUSP
40.0000 mg | Freq: Once | INTRAMUSCULAR | Status: AC
  Administered 2022-05-02: 40 mg

## 2022-05-02 MED ORDER — LIDOCAINE HCL (PF) 1 % IJ SOLN
INTRAMUSCULAR | Status: AC
  Filled 2022-05-02: qty 10

## 2022-05-02 MED ORDER — TRIAMCINOLONE ACETONIDE 40 MG/ML IJ SUSP
INTRAMUSCULAR | Status: AC
  Filled 2022-05-02: qty 1

## 2022-05-02 MED ORDER — ROPIVACAINE HCL 2 MG/ML IJ SOLN
10.0000 mL | Freq: Once | INTRAMUSCULAR | Status: AC
  Administered 2022-05-02: 10 mL via EPIDURAL

## 2022-05-02 MED ORDER — IOHEXOL 180 MG/ML  SOLN
10.0000 mL | Freq: Once | INTRAMUSCULAR | Status: AC | PRN
  Administered 2022-05-02: 10 mL via EPIDURAL

## 2022-05-02 MED ORDER — SODIUM CHLORIDE (PF) 0.9 % IJ SOLN
INTRAMUSCULAR | Status: AC
  Filled 2022-05-02: qty 10

## 2022-05-02 MED ORDER — SODIUM CHLORIDE 0.9% FLUSH
10.0000 mL | Freq: Once | INTRAVENOUS | Status: AC
  Administered 2022-05-02: 10 mL

## 2022-05-02 NOTE — Patient Instructions (Signed)

## 2022-05-03 ENCOUNTER — Telehealth: Payer: Self-pay | Admitting: *Deleted

## 2022-05-03 NOTE — Telephone Encounter (Signed)
Patient informed our office that the pharmacy only filled #21 tablets from the Rx for Tramadol. I called [place], patient's insurance only approved 21 tablets. PA submitted.

## 2022-05-03 NOTE — Telephone Encounter (Signed)
No problems post procedure. 

## 2022-05-04 NOTE — Progress Notes (Signed)
Subjective:  [name], female    DOB: October 07, 1945  Age: 77 y.o. MRN: [id number]  CC: Back Pain (lower)   Procedure: L5-S1 epidural steroid without sedation and with fluoroscopy  HPI Tina Duarte presents for reevaluation.  She continues to have low back pain bilateral hip and buttock pain and some posterior leg pain.  She denies any significant change since her most recent evaluation and desires to proceed with a repeat epidural.  Her last epidural was in December of last year and she responded favorably to that.  She got 75 to 80% reduction in her back pain and significant resolution of her leg hip and buttock pain rated at 90 to 100% lasting about 2 months.  She had a gradual recurrence of that pain and desires to proceed with an epidural today.  She has been off her Eliquis for 4 days.  Outpatient Medications Prior to Visit  Medication Sig Dispense Refill   albuterol (ACCUNEB) 0.63 MG/3ML nebulizer solution Take 3 mLs (0.63 mg total) by nebulization every 6 (six) hours as needed for wheezing. 75 mL 3   albuterol (VENTOLIN HFA) 108 (90 Base) MCG/ACT inhaler Inhale 2 puffs into the lungs every 6 (six) hours as needed for wheezing or shortness of breath. 18 g 2   citalopram (CELEXA) 20 MG tablet Take 1 tablet (20 mg total) by mouth daily. 90 tablet 1   ELIQUIS 2.5 MG TABS tablet TAKE 1 TABLET BY MOUTH TWICE A DAY 60 tablet 3   rosuvastatin (CRESTOR) 10 MG tablet Take 1 tablet (10 mg total) by mouth daily. 90 tablet 3   telmisartan (MICARDIS) 40 MG tablet Take 1 tablet (40 mg total) by mouth daily. 90 tablet 1   No facility-administered medications prior to visit.    Review of Systems CNS: No confusion or sedation Cardiac: No angina or palpitations GI: No abdominal pain or constipation Constitutional: No nausea vomiting fevers or chills  Objective:  BP (!) 151/95   Pulse 90   Temp (!) 96.9 F (36.1 C) (Temporal)   Resp 15   Ht 5\' 4"  (1.626 m)   Wt 224 lb (101.6 kg)    LMP 04/29/1997   SpO2 97%   BMI 38.45 kg/m    BP Readings from Last 3 Encounters:  05/02/22 (!) 151/95  04/13/22 122/70  04/07/22 126/80     Wt Readings from Last 3 Encounters:  05/02/22 224 lb (101.6 kg)  04/13/22 224 lb (101.6 kg)  04/07/22 226 lb 6.4 oz (102.7 kg)     Physical Exam Pt is alert and oriented PERRL EOMI HEART IS RRR no murmur or rub LCTA no wheezing or rales MUSCULOSKELETAL reveals some paraspinous muscle tenderness but no overt trigger points.  She is ambulating with an antalgic gait but her muscle tone and bulk is at baseline.  No change in motor strength is noted.  Labs  Lab Results  Component Value Date   HGBA1C 5.9 04/13/2022   HGBA1C 6.1 01/11/2022   HGBA1C 6.0 09/20/2021   Lab Results  Component Value Date   LDLCALC 84 01/11/2022   CREATININE 1.03 04/13/2022    -------------------------------------------------------------------------------------------------------------------- Lab Results  Component Value Date   WBC 8.2 01/11/2022   HGB 14.2 01/11/2022   HCT 42.8 01/11/2022   PLT 269.0 01/11/2022   GLUCOSE 86 04/13/2022   CHOL 161 04/13/2022   TRIG 239.0 (H) 04/13/2022   HDL 49.60 04/13/2022   LDLDIRECT 81.0 04/13/2022   LDLCALC 84 01/11/2022  ALT 9 04/13/2022   AST 15 04/13/2022   NA 141 04/13/2022   K 4.6 04/13/2022   CL 104 04/13/2022   CREATININE 1.03 04/13/2022   BUN 12 04/13/2022   CO2 27 04/13/2022   TSH 3.29 04/13/2022   INR 1.2 04/23/2019   HGBA1C 5.9 04/13/2022    --------------------------------------------------------------------------------------------------------------------- DG PAIN CLINIC C-ARM 1-60 MIN NO REPORT  Result Date: 05/02/2022 Fluoro was used, but no Radiologist interpretation will be provided. Please refer to "NOTES" tab for provider progress note.    Assessment & Plan:   Cristene was seen today for back pain.  Diagnoses and all orders for this visit:  Primary osteoarthritis of right  knee  DDD (degenerative disc disease), lumbar -     triamcinolone acetonide (KENALOG-40) injection 40 mg -     sodium chloride flush (NS) 0.9 % injection 10 mL -     ropivacaine (PF) 2 mg/mL (0.2%) (NAROPIN) injection 10 mL -     lidocaine (PF) (XYLOCAINE) 1 % injection 5 mL -     iohexol (OMNIPAQUE) 180 MG/ML injection 10 mL  Facet arthritis of lumbar region  Hip pain, bilateral  Spinal stenosis of lumbar region with neurogenic claudication -     triamcinolone acetonide (KENALOG-40) injection 40 mg -     sodium chloride flush (NS) 0.9 % injection 10 mL -     ropivacaine (PF) 2 mg/mL (0.2%) (NAROPIN) injection 10 mL -     lidocaine (PF) (XYLOCAINE) 1 % injection 5 mL -     iohexol (OMNIPAQUE) 180 MG/ML injection 10 mL  Other orders -     traMADol (ULTRAM) 50 MG tablet; Take 1 tablet (50 mg total) by mouth 3 (three) times daily.        ----------------------------------------------------------------------------------------------------------------------  Problem List Items Addressed This Visit       Unprioritized   DDD (degenerative disc disease), lumbar   Relevant Medications   traMADol (ULTRAM) 50 MG tablet   Facet arthritis of lumbar region   Relevant Medications   traMADol (ULTRAM) 50 MG tablet   Spinal stenosis of lumbar region   Other Visit Diagnoses     Primary osteoarthritis of right knee    -  Primary   Relevant Medications   traMADol (ULTRAM) 50 MG tablet   triamcinolone acetonide (KENALOG-40) injection 40 mg (Completed)   Hip pain, bilateral             ----------------------------------------------------------------------------------------------------------------------  1. Primary osteoarthritis of right knee Continue current medication management with quad strengthening as tolerated  2. DDD (degenerative disc disease), lumbar We will proceed with a repeat epidural today.  She is doing well with these and has had successful relief lasting about 2  months on average with the most recent epidural back in December.  I have encouraged her to continue with core stretching strengthening exercises as tolerated and take advantage of the warm weather with increased activity.  We have gone over the risks benefits of the procedure with her in full detail all questions were answered.  Continue follow-up with her primary care physicians for baseline medical care with return to clinic scheduled in about 1 month - triamcinolone acetonide (KENALOG-40) injection 40 mg - sodium chloride flush (NS) 0.9 % injection 10 mL - ropivacaine (PF) 2 mg/mL (0.2%) (NAROPIN) injection 10 mL - lidocaine (PF) (XYLOCAINE) 1 % injection 5 mL - iohexol (OMNIPAQUE) 180 MG/ML injection 10 mL  3. Facet arthritis of lumbar region As above  4. Hip pain, bilateral  As above  5. Spinal stenosis of lumbar region with neurogenic claudication As above - triamcinolone acetonide (KENALOG-40) injection 40 mg - sodium chloride flush (NS) 0.9 % injection 10 mL - ropivacaine (PF) 2 mg/mL (0.2%) (NAROPIN) injection 10 mL - lidocaine (PF) (XYLOCAINE) 1 % injection 5 mL - iohexol (OMNIPAQUE) 180 MG/ML injection 10 mL    ----------------------------------------------------------------------------------------------------------------------  I am having Tawny Asal. Beutler start on traMADol. I am also having her maintain her albuterol, albuterol, citalopram, telmisartan, Eliquis, and rosuvastatin. We administered triamcinolone acetonide, sodium chloride flush, ropivacaine (PF) 2 mg/mL (0.2%), lidocaine (PF), and iohexol.   Meds ordered this encounter  Medications   traMADol (ULTRAM) 50 MG tablet    Sig: Take 1 tablet (50 mg total) by mouth 3 (three) times daily.    Dispense:  90 tablet    Refill:  1   triamcinolone acetonide (KENALOG-40) injection 40 mg   sodium chloride flush (NS) 0.9 % injection 10 mL   ropivacaine (PF) 2 mg/mL (0.2%) (NAROPIN) injection 10 mL   lidocaine (PF)  (XYLOCAINE) 1 % injection 5 mL   iohexol (OMNIPAQUE) 180 MG/ML injection 10 mL   Patient's Medications  New Prescriptions   TRAMADOL (ULTRAM) 50 MG TABLET    Take 1 tablet (50 mg total) by mouth 3 (three) times daily.  Previous Medications   ALBUTEROL (ACCUNEB) 0.63 MG/3ML NEBULIZER SOLUTION    Take 3 mLs (0.63 mg total) by nebulization every 6 (six) hours as needed for wheezing.   ALBUTEROL (VENTOLIN HFA) 108 (90 BASE) MCG/ACT INHALER    Inhale 2 puffs into the lungs every 6 (six) hours as needed for wheezing or shortness of breath.   CITALOPRAM (CELEXA) 20 MG TABLET    Take 1 tablet (20 mg total) by mouth daily.   ELIQUIS 2.5 MG TABS TABLET    TAKE 1 TABLET BY MOUTH TWICE A DAY   ROSUVASTATIN (CRESTOR) 10 MG TABLET    Take 1 tablet (10 mg total) by mouth daily.   TELMISARTAN (MICARDIS) 40 MG TABLET    Take 1 tablet (40 mg total) by mouth daily.  Modified Medications   No medications on file  Discontinued Medications   No medications on file   ----------------------------------------------------------------------------------------------------------------------  Follow-up: Return in about 1 month (around 06/01/2022) for evaluation, med refill, procedure.   Procedure: L5-S1 LESI with fluoroscopic guidance and without moderate sedation  NOTE: The risks, benefits, and expectations of the procedure have been discussed and explained to the patient who was understanding and in agreement with suggested treatment plan. No guarantees were made.  DESCRIPTION OF PROCEDURE: Lumbar epidural steroid injection with no IV Versed, EKG, blood pressure, pulse, and pulse oximetry monitoring. The procedure was performed with the patient in the prone position under fluoroscopic guidance.  Sterile prep x3 was initiated and I then injected subcutaneous lidocaine to the overlying L5-S1 site after its fluoroscopic identifictation.  Using strict aseptic technique, I then advanced an 18-gauge Tuohy epidural needle in  the midline using interlaminar approach via loss-of-resistance to saline technique. There was negative aspiration for heme or  CSF.  I then confirmed position with both AP and Lateral fluoroscan.  2 cc of contrast dye were injected and a  total of 5 mL of Preservative-Free normal saline mixed with 40 mg of Kenalog and 1cc Ropicaine 0.2 percent were injected incrementally via the  epidurally placed needle. The needle was removed. The patient tolerated the injection well and was convalesced and discharged to home in stable condition. Should  the patient have any post procedure difficulty they have been instructed on how to contact us for assistance.   Molli Barrows, MD

## 2022-05-11 DIAGNOSIS — J449 Chronic obstructive pulmonary disease, unspecified: Secondary | ICD-10-CM | POA: Diagnosis not present

## 2022-05-29 ENCOUNTER — Other Ambulatory Visit: Payer: Self-pay | Admitting: Internal Medicine

## 2022-05-30 ENCOUNTER — Ambulatory Visit: Payer: Medicare Other | Attending: Anesthesiology | Admitting: Anesthesiology

## 2022-05-30 ENCOUNTER — Encounter: Payer: Self-pay | Admitting: Anesthesiology

## 2022-05-30 VITALS — BP 121/69 | HR 97 | Temp 97.2°F | Resp 18 | Ht 64.0 in | Wt 225.0 lb

## 2022-05-30 DIAGNOSIS — M5136 Other intervertebral disc degeneration, lumbar region: Secondary | ICD-10-CM | POA: Diagnosis not present

## 2022-05-30 DIAGNOSIS — M1711 Unilateral primary osteoarthritis, right knee: Secondary | ICD-10-CM | POA: Insufficient documentation

## 2022-05-30 DIAGNOSIS — M25551 Pain in right hip: Secondary | ICD-10-CM | POA: Diagnosis not present

## 2022-05-30 DIAGNOSIS — M48062 Spinal stenosis, lumbar region with neurogenic claudication: Secondary | ICD-10-CM | POA: Insufficient documentation

## 2022-05-30 DIAGNOSIS — M47816 Spondylosis without myelopathy or radiculopathy, lumbar region: Secondary | ICD-10-CM | POA: Insufficient documentation

## 2022-05-30 DIAGNOSIS — M25552 Pain in left hip: Secondary | ICD-10-CM

## 2022-05-30 MED ORDER — TRAMADOL HCL 50 MG PO TABS: 50.0000 mg | ORAL_TABLET | Freq: Three times a day (TID) | ORAL | 1 refills | Status: AC

## 2022-05-30 NOTE — Progress Notes (Signed)
Nursing Pain Medication Assessment:  Safety precautions to be maintained throughout the outpatient stay will include: orient to surroundings, keep bed in low position, maintain call bell within reach at all times, provide assistance with transfer out of bed and ambulation.  Medication Inspection Compliance: Tina Duarte did not comply with our request to bring her pills to be counted. She was reminded that bringing the medication bottles, even when empty, is a requirement.  Medication: None brought in. Pill/Patch Count: None available to be counted. Bottle Appearance: No container available. Did not bring bottle(s) to appointment. Filled Date: N/A Last Medication intake:  Today Safety precautions to be maintained throughout the outpatient stay will include: orient to surroundings, keep bed in low position, maintain call bell within reach at all times, provide assistance with transfer out of bed and ambulation.

## 2022-05-30 NOTE — Patient Instructions (Addendum)
Take over the counter Naproxen twice per day with foot.   ______________________________________________________________________  Medication Recommendations and Reminders  Applies to: All patients receiving prescriptions (written and/or electronic).  Medication Rules & Regulations: You are responsible for reading, knowing, and following our "Medication Rules" document. These exist for your safety and that of others. They are not flexible and neither are we. Dismissing or ignoring them is an act of "non-compliance" that may result in complete and irreversible termination of such medication therapy. For safety reasons, "non-compliance" will not be tolerated. As with the [place] fundamental legal principle of "ignorance of the law is no defense", we will accept no excuses for not having read and knowing the content of documents provided to you by our practice.  Pharmacy of record:  Definition: This is the pharmacy where your electronic prescriptions will be sent.  We do not endorse any particular pharmacy. It is up to you and your insurance to decide what pharmacy to use.  We do not restrict you in your choice of pharmacy. However, once we write for your prescriptions, we will NOT be re-sending more prescriptions to fix restricted supply problems created by your pharmacy, or your insurance.  The pharmacy listed in the electronic medical record should be the one where you want electronic prescriptions to be sent. If you choose to change pharmacy, simply notify our nursing staff. Changes will be made only during your regular appointments and not over the phone.  Recommendations: Keep all of your pain medications in a safe place, under lock and key, even if you live alone. We will NOT replace lost, stolen, or damaged medication. We do not accept "Police Reports" as proof of medications having been stolen. After you fill your prescription, take 1 week's worth of pills and put them away in a safe place. You  should keep a separate, properly labeled bottle for this purpose. The remainder should be kept in the original bottle. Use this as your primary supply, until it runs out. Once it's gone, then you know that you have 1 week's worth of medicine, and it is time to come in for a prescription refill. If you do this correctly, it is unlikely that you will ever run out of medicine. To make sure that the above recommendation works, it is very important that you make sure your medication refill appointments are scheduled at least 1 week before you run out of medicine. To do this in an effective manner, make sure that you do not leave the office without scheduling your next medication management appointment. Always ask the nursing staff to show you in your prescription , when your medication will be running out. Then arrange for the receptionist to get you a return appointment, at least 7 days before you run out of medicine. Do not wait until you have 1 or 2 pills left, to come in. This is very poor planning and does not take into consideration that we may need to cancel appointments due to bad weather, sickness, or emergencies affecting our staff. DO NOT ACCEPT A "Partial Fill": If for any reason your pharmacy does not have enough pills/tablets to completely fill or refill your prescription, do not allow for a "partial fill". The law allows the pharmacy to complete that prescription within 72 hours, without requiring a new prescription. If they do not fill the rest of your prescription within those 72 hours, you will need a separate prescription to fill the remaining amount, which we will NOT provide. If the  reason for the partial fill is your insurance, you will need to talk to the pharmacist about payment alternatives for the remaining tablets, but again, DO NOT ACCEPT A PARTIAL FILL, unless you can trust your pharmacist to obtain the remainder of the pills within 72 hours.  Prescription refills and/or changes in  medication(s):  Prescription refills, and/or changes in dose or medication, will be conducted only during scheduled medication management appointments. (Applies to both, written and electronic prescriptions.) No refills on procedure days. No medication will be changed or started on procedure days. No changes, adjustments, and/or refills will be conducted on a procedure day. Doing so will interfere with the diagnostic portion of the procedure. No phone refills. No medications will be "called into the pharmacy". No Fax refills. No weekend refills. No Holliday refills. No after hours refills.  Remember:  Business hours are:  Monday to Thursday 8:00 AM to 4:00 PM Provider's Schedule: Delano Metz, MD - Appointments are:  Medication management: Monday and Wednesday 8:00 AM to 4:00 PM Procedure day: Tuesday and Thursday 7:30 AM to 4:00 PM Edward Jolly, MD - Appointments are:  Medication management: Tuesday and Thursday 8:00 AM to 4:00 PM Procedure day: Monday and Wednesday 7:30 AM to 4:00 PM (Last update: 11/22/2021) ______________________________________________________________________ Practice stretching exercises Dr. Pernell Dupre spoke with you about.

## 2022-06-10 DIAGNOSIS — J449 Chronic obstructive pulmonary disease, unspecified: Secondary | ICD-10-CM | POA: Diagnosis not present

## 2022-06-19 ENCOUNTER — Other Ambulatory Visit: Payer: Self-pay | Admitting: *Deleted

## 2022-06-19 ENCOUNTER — Telehealth: Payer: Self-pay | Admitting: Internal Medicine

## 2022-06-19 MED ORDER — CITALOPRAM HYDROBROMIDE 20 MG PO TABS: 20.0000 mg | ORAL_TABLET | Freq: Every day | ORAL | 1 refills | Status: DC

## 2022-06-19 NOTE — Telephone Encounter (Signed)
Pt need a refill on citalopram sent to [facility]

## 2022-06-19 NOTE — Telephone Encounter (Signed)
Was originally sent to [facility]; pt states she never picked it up b/c she doesn't use that pharmacy anymore. I have resent to [facility] & removed [facility] from med list. Pt aware Rx has been sent to [facility]

## 2022-06-20 NOTE — Progress Notes (Signed)
Subjective:  [name], female    DOB: 1945-12-10  Age: 77 y.o. MRN: [id number]  CC: Hip Pain (bilateral)   Procedure: None  HPI Teagann Clemensen Yanko presents for reevaluation.  Monifah continues to struggle with chronic hip and back pain.  She reports that she takes her tramadol and this generally gives her significant improvement rated about 25 to 50%.  No other changes are noted in the quality or characteristic of her low back pain and hip pain at this point.  Otherwise she is in her usual state of health.  She is tolerating the medications well and they enable her to stay active.  No side effects reported.  She tries to do her physical therapy stretching strengthening exercises as tolerated.  Outpatient Medications Prior to Visit  Medication Sig Dispense Refill   albuterol (ACCUNEB) 0.63 MG/3ML nebulizer solution Take 3 mLs (0.63 mg total) by nebulization every 6 (six) hours as needed for wheezing. 75 mL 3   albuterol (VENTOLIN HFA) 108 (90 Base) MCG/ACT inhaler Inhale 2 puffs into the lungs every 6 (six) hours as needed for wheezing or shortness of breath. 18 g 2   ELIQUIS 2.5 MG TABS tablet TAKE 1 TABLET BY MOUTH TWICE A DAY 60 tablet 3   rosuvastatin (CRESTOR) 10 MG tablet Take 1 tablet (10 mg total) by mouth daily. 90 tablet 3   telmisartan (MICARDIS) 40 MG tablet Take 1 tablet (40 mg total) by mouth daily. 90 tablet 1   citalopram (CELEXA) 20 MG tablet TAKE 1 TABLET BY MOUTH EVERY DAY 90 tablet 1   traMADol (ULTRAM) 50 MG tablet Take 1 tablet (50 mg total) by mouth 3 (three) times daily. 90 tablet 1   No facility-administered medications prior to visit.    Review of Systems CNS: No confusion or sedation Cardiac: No angina or palpitations GI: No abdominal pain or constipation Constitutional: No nausea vomiting fevers or chills  Objective:  BP 121/69   Pulse 97   Temp (!) 97.2 F (36.2 C) (Temporal)   Resp 18   Ht 5\' 4"  (1.626 m)   Wt 225 lb (102.1 kg)   LMP  04/29/1997   SpO2 99%   BMI 38.62 kg/m    BP Readings from Last 3 Encounters:  05/30/22 121/69  05/02/22 (!) 151/95  04/13/22 122/70     Wt Readings from Last 3 Encounters:  05/30/22 225 lb (102.1 kg)  05/02/22 224 lb (101.6 kg)  04/13/22 224 lb (101.6 kg)     Physical Exam Pt is alert and oriented PERRL EOMI HEART IS RRR no murmur or rub LCTA no wheezing or rales MUSCULOSKELETAL reveals some paraspinous muscle tenderness in the lumbar region but no overt trigger points are noted.  She is ambulating with an antalgic gait.  She does have some difficulty going from seated to standing.  Muscle tone and bulk is appropriate.  Labs  Lab Results  Component Value Date   HGBA1C 5.9 04/13/2022   HGBA1C 6.1 01/11/2022   HGBA1C 6.0 09/20/2021   Lab Results  Component Value Date   LDLCALC 84 01/11/2022   CREATININE 1.03 04/13/2022    -------------------------------------------------------------------------------------------------------------------- Lab Results  Component Value Date   WBC 8.2 01/11/2022   HGB 14.2 01/11/2022   HCT 42.8 01/11/2022   PLT 269.0 01/11/2022   GLUCOSE 86 04/13/2022   CHOL 161 04/13/2022   TRIG 239.0 (H) 04/13/2022   HDL 49.60 04/13/2022   LDLDIRECT 81.0 04/13/2022   LDLCALC 84  01/11/2022   ALT 9 04/13/2022   AST 15 04/13/2022   NA 141 04/13/2022   K 4.6 04/13/2022   CL 104 04/13/2022   CREATININE 1.03 04/13/2022   BUN 12 04/13/2022   CO2 27 04/13/2022   TSH 3.29 04/13/2022   INR 1.2 04/23/2019   HGBA1C 5.9 04/13/2022    --------------------------------------------------------------------------------------------------------------------- DG PAIN CLINIC C-ARM 1-60 MIN NO REPORT  Result Date: 05/02/2022 Fluoro was used, but no Radiologist interpretation will be provided. Please refer to "NOTES" tab for provider progress note.    Assessment & Plan:   There are no diagnoses linked to this  encounter.      ----------------------------------------------------------------------------------------------------------------------  Problem List Items Addressed This Visit   None     ----------------------------------------------------------------------------------------------------------------------  There are no diagnoses linked to this encounter.   ----------------------------------------------------------------------------------------------------------------------  I am having Alcide Clever. Mendell maintain her albuterol, albuterol, telmisartan, Eliquis, rosuvastatin, and traMADol.   Meds ordered this encounter  Medications   traMADol (ULTRAM) 50 MG tablet    Sig: Take 1 tablet (50 mg total) by mouth 3 (three) times daily.    Dispense:  90 tablet    Refill:  1   Patient's Medications  New Prescriptions   No medications on file  Previous Medications   ALBUTEROL (ACCUNEB) 0.63 MG/3ML NEBULIZER SOLUTION    Take 3 mLs (0.63 mg total) by nebulization every 6 (six) hours as needed for wheezing.   ALBUTEROL (VENTOLIN HFA) 108 (90 BASE) MCG/ACT INHALER    Inhale 2 puffs into the lungs every 6 (six) hours as needed for wheezing or shortness of breath.   ELIQUIS 2.5 MG TABS TABLET    TAKE 1 TABLET BY MOUTH TWICE A DAY   ROSUVASTATIN (CRESTOR) 10 MG TABLET    Take 1 tablet (10 mg total) by mouth daily.   TELMISARTAN (MICARDIS) 40 MG TABLET    Take 1 tablet (40 mg total) by mouth daily.  Modified Medications   Modified Medication Previous Medication   CITALOPRAM (CELEXA) 20 MG TABLET citalopram (CELEXA) 20 MG tablet      Take 1 tablet (20 mg total) by mouth daily.    TAKE 1 TABLET BY MOUTH EVERY DAY   TRAMADOL (ULTRAM) 50 MG TABLET traMADol (ULTRAM) 50 MG tablet      Take 1 tablet (50 mg total) by mouth 3 (three) times daily.    Take 1 tablet (50 mg total) by mouth 3 (three) times daily.  Discontinued Medications   No medications on file    ----------------------------------------------------------------------------------------------------------------------  Follow-up: Return in about 2 months (around 07/30/2022) for evaluation, med refill.  1. Primary osteoarthritis of right knee   2. DDD (degenerative disc disease), lumbar   3. Facet arthritis of lumbar region   4. Hip pain, bilateral   5. Spinal stenosis of lumbar region with neurogenic claudication   She seems to be doing well with the tramadol and I think it is appropriate to continue this.  She reports no side effects and the medication enables her to stay active and functional and sleep better at night.  I will refill the medication today with a scheduled return appointment in approximately 2 months.  Continue follow-up with her primary care physicians for baseline medical care.  No other changes are initiated today.  Yevette Edwards, MD

## 2022-06-27 ENCOUNTER — Other Ambulatory Visit: Payer: Self-pay | Admitting: Internal Medicine

## 2022-07-04 ENCOUNTER — Other Ambulatory Visit: Payer: Self-pay | Admitting: Internal Medicine

## 2022-07-11 DIAGNOSIS — J449 Chronic obstructive pulmonary disease, unspecified: Secondary | ICD-10-CM | POA: Diagnosis not present

## 2022-07-19 ENCOUNTER — Ambulatory Visit: Payer: Medicare Other | Admitting: Internal Medicine

## 2022-08-07 ENCOUNTER — Telehealth: Payer: Self-pay

## 2022-08-07 NOTE — Telephone Encounter (Signed)
Patient returned our call.  I read Tina Ohara, LPN's message to patient.  I rescheduled patient's appointment to 08/11/2022 at 8am.

## 2022-08-07 NOTE — Telephone Encounter (Signed)
Noted  

## 2022-08-07 NOTE — Telephone Encounter (Signed)
LM for patient. Trying to move her appt on 7/10 at 8:00 am to 7/12 at 8:00 am. If patient is able to do this please move her appt. If not, let me know.

## 2022-08-09 ENCOUNTER — Ambulatory Visit: Payer: Medicare Other | Admitting: Internal Medicine

## 2022-08-10 ENCOUNTER — Encounter: Payer: Self-pay | Admitting: Anesthesiology

## 2022-08-10 ENCOUNTER — Ambulatory Visit: Payer: Medicare Other | Attending: Anesthesiology | Admitting: Anesthesiology

## 2022-08-10 DIAGNOSIS — M25552 Pain in left hip: Secondary | ICD-10-CM

## 2022-08-10 DIAGNOSIS — M47816 Spondylosis without myelopathy or radiculopathy, lumbar region: Secondary | ICD-10-CM

## 2022-08-10 DIAGNOSIS — M1711 Unilateral primary osteoarthritis, right knee: Secondary | ICD-10-CM

## 2022-08-10 DIAGNOSIS — M48062 Spinal stenosis, lumbar region with neurogenic claudication: Secondary | ICD-10-CM | POA: Diagnosis not present

## 2022-08-10 DIAGNOSIS — M25551 Pain in right hip: Secondary | ICD-10-CM

## 2022-08-10 DIAGNOSIS — M5136 Other intervertebral disc degeneration, lumbar region: Secondary | ICD-10-CM | POA: Diagnosis not present

## 2022-08-10 DIAGNOSIS — J449 Chronic obstructive pulmonary disease, unspecified: Secondary | ICD-10-CM | POA: Diagnosis not present

## 2022-08-10 MED ORDER — TRAMADOL HCL 50 MG PO TABS: 50.0000 mg | ORAL_TABLET | Freq: Three times a day (TID) | ORAL | 1 refills | Status: AC

## 2022-08-11 ENCOUNTER — Ambulatory Visit (INDEPENDENT_AMBULATORY_CARE_PROVIDER_SITE_OTHER): Payer: Medicare Other | Admitting: Internal Medicine

## 2022-08-11 VITALS — BP 126/70 | HR 90 | Temp 98.0°F | Resp 16 | Ht 64.0 in | Wt 226.0 lb

## 2022-08-11 DIAGNOSIS — Z1211 Encounter for screening for malignant neoplasm of colon: Secondary | ICD-10-CM

## 2022-08-11 DIAGNOSIS — M5442 Lumbago with sciatica, left side: Secondary | ICD-10-CM

## 2022-08-11 DIAGNOSIS — E7800 Pure hypercholesterolemia, unspecified: Secondary | ICD-10-CM

## 2022-08-11 DIAGNOSIS — R739 Hyperglycemia, unspecified: Secondary | ICD-10-CM | POA: Diagnosis not present

## 2022-08-11 DIAGNOSIS — F439 Reaction to severe stress, unspecified: Secondary | ICD-10-CM

## 2022-08-11 DIAGNOSIS — D6851 Activated protein C resistance: Secondary | ICD-10-CM | POA: Diagnosis not present

## 2022-08-11 DIAGNOSIS — I700 Atherosclerosis of aorta: Secondary | ICD-10-CM | POA: Diagnosis not present

## 2022-08-11 DIAGNOSIS — D6859 Other primary thrombophilia: Secondary | ICD-10-CM | POA: Diagnosis not present

## 2022-08-11 DIAGNOSIS — J4520 Mild intermittent asthma, uncomplicated: Secondary | ICD-10-CM

## 2022-08-11 DIAGNOSIS — I10 Essential (primary) hypertension: Secondary | ICD-10-CM

## 2022-08-11 MED ORDER — APIXABAN 2.5 MG PO TABS: 2.5000 mg | ORAL_TABLET | Freq: Two times a day (BID) | ORAL | 3 refills | Status: DC

## 2022-08-11 MED ORDER — ROSUVASTATIN CALCIUM 10 MG PO TABS: 10.0000 mg | ORAL_TABLET | Freq: Every day | ORAL | 3 refills | Status: DC

## 2022-08-11 NOTE — Progress Notes (Signed)
Subjective:    [name], female    DOB: 06-Oct-1945, 77 y.o.   MRN: [id number]  Patient here for  Chief Complaint  Patient presents with   Medical Management of Chronic Issues    HPI Here to follow up regarding hypercholesterolemia, hypertension, hyperglycemia and COPD. Also f/u increased stress. On citalopram.  Discussed increased stress at home.  Desires no further intervention at this time.  Is limited by her back pain - regarding activity.  Not able to stand long periods of time.  Sees Dr Pernell Dupre.  Injections not helping.  He placed her on tramadol.  Discussed possible interaction with SSRI.  Does help her pain some.  Wants to continue.  No chest pain.  Breathing stable.  Does have loose stool after eating.  No abdominal pain.  No vomiting. Discussed colonoscopy.    Past Medical History:  Diagnosis Date   Allergy    Asthma    Chicken pox    COPD (chronic obstructive pulmonary disease) (HCC)    History of blood clots    Hypercholesterolemia    Hypertension    Retroperitoneal hematoma 03/2019   Past Surgical History:  Procedure Laterality Date   blood clots  2008   TUBAL LIGATION     Family History  Problem Relation Age of Onset   Cancer Mother        Breast   Heart disease Mother    Stroke Mother    Hypertension Mother    Breast cancer Mother        28's   Heart disease Father    Stroke Father    Hypertension Father    Diabetes Father    Colon cancer Other        paternal cousin   Social History   Socioeconomic History   Marital status: Widowed    Spouse name: Not on file   Number of children: Not on file   Years of education: Not on file   Highest education level: Not on file  Occupational History   Not on file  Tobacco Use   Smoking status: Never    Passive exposure: Past   Smokeless tobacco: Never   Tobacco comments:    Exposed to chemical and 2nd had smoke.  Vaping Use   Vaping status: Never Used  Substance and Sexual Activity    Alcohol use: No    Alcohol/week: 0.0 standard drinks of alcohol   Drug use: No   Sexual activity: Not on file  Other Topics Concern   Not on file  Social History Narrative   Lives in [place] with son.  never smoked; no alcohol. Used to work in [employer].    Social Determinants of Health   Financial Resource Strain: Low Risk  (12/28/2020)   Overall Financial Resource Strain (CARDIA)    Difficulty of Paying Living Expenses: Not very hard  Recent Concern: Financial Resource Strain - Medium Risk (11/02/2020)   Overall Financial Resource Strain (CARDIA)    Difficulty of Paying Living Expenses: Somewhat hard  Food Insecurity: No Food Insecurity (10/21/2020)   Hunger Vital Sign    Worried About Running Out of Food in the Last Year: Never true    Ran Out of Food in the Last Year: Never true  Transportation Needs: No Transportation Needs (10/21/2020)   PRAPARE - Administrator, Civil Service (Medical): No    Lack of Transportation (Non-Medical): No  Physical Activity: Unknown (08/19/2018)   Exercise Vital Sign  Days of Exercise per Week: 0 days    Minutes of Exercise per Session: Not on file  Stress: No Stress Concern Present (10/21/2020)   [employer] of Occupational Health - Occupational Stress Questionnaire    Feeling of Stress : Not at all  Social Connections: Unknown (10/21/2020)   Social Connection and Isolation Panel [NHANES]    Frequency of Communication with Friends and Family: More than three times a week    Frequency of Social Gatherings with Friends and Family: More than three times a week    Attends Religious Services: Not on Marketing executive or Organizations: Not on file    Attends Banker Meetings: Not on file    Marital Status: Not on file     Review of Systems  Constitutional:  Negative for fever and unexpected weight change.  HENT:  Negative for congestion and sinus pressure.   Respiratory:  Negative for cough and  chest tightness.        Breathing stable.   Cardiovascular:  Negative for chest pain, palpitations and leg swelling.  Gastrointestinal:  Negative for nausea and vomiting.       Loose stool as outlined.  No abdominal pain.   Genitourinary:  Negative for difficulty urinating and dysuria.  Musculoskeletal:  Positive for back pain. Negative for myalgias.  Skin:  Negative for color change and rash.  Neurological:  Negative for dizziness and headaches.  Psychiatric/Behavioral:  Negative for agitation and dysphoric mood.        Objective:     BP 126/70   Pulse 90   Temp 98 F (36.7 C)   Resp 16   Ht 5\' 4"  (1.626 m)   Wt 226 lb (102.5 kg)   LMP 04/29/1997   SpO2 98%   BMI 38.79 kg/m  Wt Readings from Last 3 Encounters:  08/11/22 226 lb (102.5 kg)  05/30/22 225 lb (102.1 kg)  05/02/22 224 lb (101.6 kg)    Physical Exam Vitals reviewed.  Constitutional:      General: She is not in acute distress.    Appearance: Normal appearance.  HENT:     Head: Normocephalic and atraumatic.     Right Ear: External ear normal.     Left Ear: External ear normal.  Eyes:     General: No scleral icterus.       Right eye: No discharge.        Left eye: No discharge.     Conjunctiva/sclera: Conjunctivae normal.  Neck:     Thyroid: No thyromegaly.  Cardiovascular:     Rate and Rhythm: Normal rate and regular rhythm.  Pulmonary:     Effort: No respiratory distress.     Breath sounds: Normal breath sounds. No wheezing.  Abdominal:     General: Bowel sounds are normal.     Palpations: Abdomen is soft.     Tenderness: There is no abdominal tenderness.  Musculoskeletal:        General: No swelling or tenderness.     Cervical back: Neck supple. No tenderness.  Lymphadenopathy:     Cervical: No cervical adenopathy.  Skin:    Findings: No erythema or rash.  Neurological:     Mental Status: She is alert.  Psychiatric:        Mood and Affect: Mood normal.        Behavior: Behavior normal.       Outpatient Encounter Medications as of 08/11/2022  Medication Sig  albuterol (ACCUNEB) 0.63 MG/3ML nebulizer solution Take 3 mLs (0.63 mg total) by nebulization every 6 (six) hours as needed for wheezing.   albuterol (VENTOLIN HFA) 108 (90 Base) MCG/ACT inhaler Inhale 2 puffs into the lungs every 6 (six) hours as needed for wheezing or shortness of breath.   apixaban (ELIQUIS) 2.5 MG TABS tablet Take 1 tablet (2.5 mg total) by mouth 2 (two) times daily.   citalopram (CELEXA) 20 MG tablet Take 1 tablet (20 mg total) by mouth daily.   rosuvastatin (CRESTOR) 10 MG tablet Take 1 tablet (10 mg total) by mouth daily.   telmisartan (MICARDIS) 40 MG tablet TAKE ONE TABLET EVERY DAY   traMADol (ULTRAM) 50 MG tablet Take 1 tablet (50 mg total) by mouth 3 (three) times daily.   [DISCONTINUED] ELIQUIS 2.5 MG TABS tablet TAKE ONE TABLET TWICE DAILY   [DISCONTINUED] rosuvastatin (CRESTOR) 10 MG tablet Take 1 tablet (10 mg total) by mouth daily.   No facility-administered encounter medications on file as of 08/11/2022.     Lab Results  Component Value Date   WBC 8.2 01/11/2022   HGB 14.2 01/11/2022   HCT 42.8 01/11/2022   PLT 269.0 01/11/2022   GLUCOSE 86 04/13/2022   CHOL 161 04/13/2022   TRIG 239.0 (H) 04/13/2022   HDL 49.60 04/13/2022   LDLDIRECT 81.0 04/13/2022   LDLCALC 84 01/11/2022   ALT 9 04/13/2022   AST 15 04/13/2022   NA 141 04/13/2022   K 4.6 04/13/2022   CL 104 04/13/2022   CREATININE 1.03 04/13/2022   BUN 12 04/13/2022   CO2 27 04/13/2022   TSH 3.29 04/13/2022   INR 1.2 04/23/2019   HGBA1C 5.9 04/13/2022    DG PAIN CLINIC C-ARM 1-60 MIN NO REPORT  Result Date: 05/02/2022 Fluoro was used, but no Radiologist interpretation will be provided. Please refer to "NOTES" tab for provider progress note.      Assessment & Plan:  Essential hypertension, benign Assessment & Plan:  On micardis 40mg  q day.  Follow pressures.  Follow metabolic panel.   Orders: -     Basic  metabolic panel; Future  Hypercholesterolemia Assessment & Plan: Continue crestor.  Low cholesterol diet and exercise.  Follow lipid panel and liver function tests.    Orders: -     CBC with Differential/Platelet; Future -     Hepatic function panel; Future -     Lipid panel; Future  Hyperglycemia Assessment & Plan: Low carb diet and exercise.  Follow met b and a1c.   Orders: -     Hemoglobin A1c; Future  Aortic atherosclerosis (HCC) Assessment & Plan: Continue crestor.    Mild intermittent asthma without complication Assessment & Plan: Breathing stable.  Stable. Follow.    Colon cancer screening Assessment & Plan: Father's side of family with history of colon cancer.  Saw GI.  Was scheduled for colonoscopy.  Was put on hold until dizziness resolved and acute infection cleared. Dizziness resolved.  Breathing better.  No increased cough or congestion. Pulmonary cleared.  Was scheduled again for colonoscopy.  Had to be postponed due to increased cough, congestion and wheezing with associated sob with exertion. Breathing back to baseline now.  Discussed colonoscopy with her. She is agreeable for referral.   Orders: -     Ambulatory referral to Gastroenterology  Factor V Leiden [facility]) Assessment & Plan: On eliquis.  Discussed with hematology - ok to stop for colonoscopy.     Left-sided low back pain with left-sided sciatica,  unspecified chronicity Assessment & Plan: Followed by pain clinic.  Dr Pernell Dupre   Primary hypercoagulable state [facility]) Assessment & Plan: Continue eliquis as outlined.  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  Recommended indefinite anticoagulation.     Stress Assessment & Plan: Continues on citalopram.  Does not feel needs any further intervention. Follow.    Other orders -     Apixaban; Take 1 tablet (2.5 mg total) by mouth 2 (two) times daily.  Dispense: 60 tablet; Refill: 3 -     Rosuvastatin Calcium; Take 1 tablet (10 mg  total) by mouth daily.  Dispense: 90 tablet; Refill: 3     Dale , MD

## 2022-08-15 ENCOUNTER — Encounter: Payer: Self-pay | Admitting: Internal Medicine

## 2022-08-15 NOTE — Assessment & Plan Note (Signed)
Breathing stable.  Stable. Follow.

## 2022-08-15 NOTE — Assessment & Plan Note (Signed)
On eliquis.  Discussed with hematology - ok to stop for colonoscopy.   

## 2022-08-15 NOTE — Assessment & Plan Note (Signed)
Continue eliquis as outlined.  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  Recommended indefinite anticoagulation.   

## 2022-08-15 NOTE — Assessment & Plan Note (Signed)
Father's side of family with history of colon cancer.  Saw GI.  Was scheduled for colonoscopy.  Was put on hold until dizziness resolved and acute infection cleared. Dizziness resolved.  Breathing better.  No increased cough or congestion. Pulmonary cleared.  Was scheduled again for colonoscopy.  Had to be postponed due to increased cough, congestion and wheezing with associated sob with exertion. Breathing back to baseline now.  Discussed colonoscopy with her. She is agreeable for referral.

## 2022-08-15 NOTE — Assessment & Plan Note (Signed)
 Low carb diet and exercise.  Follow met b and a1c.   

## 2022-08-15 NOTE — Assessment & Plan Note (Signed)
On micardis 40mg q day.  Follow pressures.  Follow metabolic panel.  

## 2022-08-15 NOTE — Assessment & Plan Note (Signed)
Continue crestor 

## 2022-08-15 NOTE — Assessment & Plan Note (Signed)
Continues on citalopram.  Does not feel needs any further intervention. Follow.

## 2022-08-15 NOTE — Assessment & Plan Note (Signed)
Followed by pain clinic.  Dr Pernell Dupre

## 2022-08-15 NOTE — Assessment & Plan Note (Signed)
Continue crestor.  Low cholesterol diet and exercise.  Follow lipid panel and liver function tests.  

## 2022-08-17 ENCOUNTER — Other Ambulatory Visit (INDEPENDENT_AMBULATORY_CARE_PROVIDER_SITE_OTHER): Payer: Medicare Other

## 2022-08-17 DIAGNOSIS — R739 Hyperglycemia, unspecified: Secondary | ICD-10-CM

## 2022-08-17 DIAGNOSIS — I10 Essential (primary) hypertension: Secondary | ICD-10-CM | POA: Diagnosis not present

## 2022-08-17 DIAGNOSIS — E7800 Pure hypercholesterolemia, unspecified: Secondary | ICD-10-CM

## 2022-08-18 LAB — CBC WITH DIFFERENTIAL/PLATELET
Basophils Absolute: 0.1 10*3/uL (ref 0.0–0.1)
Basophils Relative: 0.7 % (ref 0.0–3.0)
Eosinophils Absolute: 0.4 10*3/uL (ref 0.0–0.7)
Eosinophils Relative: 4.6 % (ref 0.0–5.0)
HCT: 43.4 % (ref 36.0–46.0)
Hemoglobin: 14.1 g/dL (ref 12.0–15.0)
Lymphocytes Relative: 39.8 % (ref 12.0–46.0)
Lymphs Abs: 3.3 10*3/uL (ref 0.7–4.0)
MCHC: 32.4 g/dL (ref 30.0–36.0)
MCV: 89.6 fl (ref 78.0–100.0)
Monocytes Absolute: 0.5 10*3/uL (ref 0.1–1.0)
Monocytes Relative: 6.6 % (ref 3.0–12.0)
Neutro Abs: 3.9 10*3/uL (ref 1.4–7.7)
Neutrophils Relative %: 48.3 % (ref 43.0–77.0)
Platelets: 261 10*3/uL (ref 150.0–400.0)
RBC: 4.85 Mil/uL (ref 3.87–5.11)
RDW: 12.4 % (ref 11.5–15.5)
WBC: 8.2 10*3/uL (ref 4.0–10.5)

## 2022-08-18 LAB — LIPID PANEL
Cholesterol: 133 mg/dL (ref 0–200)
HDL: 41.9 mg/dL (ref >39.00)
LDL Cholesterol: 57 mg/dL (ref 0–99)
NonHDL: 90.78
Total CHOL/HDL Ratio: 3
Triglycerides: 171 mg/dL — ABNORMAL HIGH (ref 0.0–149.0)
VLDL: 34.2 mg/dL (ref 0.0–40.0)

## 2022-08-18 LAB — HEPATIC FUNCTION PANEL
ALT: 10 U/L (ref 0–35)
AST: 19 U/L (ref 0–37)
Albumin: 4 g/dL (ref 3.5–5.2)
Alkaline Phosphatase: 65 U/L (ref 39–117)
Bilirubin, Direct: 0.2 mg/dL (ref 0.0–0.3)
Total Bilirubin: 0.9 mg/dL (ref 0.2–1.2)
Total Protein: 7 g/dL (ref 6.0–8.3)

## 2022-08-18 LAB — HEMOGLOBIN A1C: Hgb A1c MFr Bld: 5.9 % (ref 4.6–6.5)

## 2022-08-18 LAB — BASIC METABOLIC PANEL
BUN: 14 mg/dL (ref 6–23)
CO2: 30 mEq/L (ref 19–32)
Calcium: 9.4 mg/dL (ref 8.4–10.5)
Chloride: 103 mEq/L (ref 96–112)
Creatinine, Ser: 1.15 mg/dL (ref 0.40–1.20)
GFR: 45.52 mL/min — ABNORMAL LOW (ref >60.00)
Glucose, Bld: 84 mg/dL (ref 70–99)
Potassium: 4.5 mEq/L (ref 3.5–5.1)
Sodium: 140 mEq/L (ref 135–145)

## 2022-08-21 ENCOUNTER — Other Ambulatory Visit: Payer: Self-pay

## 2022-08-21 DIAGNOSIS — N183 Chronic kidney disease, stage 3 unspecified: Secondary | ICD-10-CM

## 2022-08-22 NOTE — Progress Notes (Signed)
Subjective:  [name], female    DOB: 30-Apr-1945  Age: 77 y.o. MRN: [id number]  CC: No chief complaint on file.   Procedure: None  HPI Tina Duarte presents for reevaluation.  Tina Duarte continues to suffer from chronic low back pain and bilateral hip pain.  Tina Duarte mentions that Tina Duarte has been taking tramadol to assist with this and this is generally effective for Tina Duarte.  Tina Duarte can take up to 3 tablets a day and on days where Tina Duarte has significant discomfort Tina Duarte uses this but sometimes can reduce this to 0 or no tablets if Tina Duarte is feeling well.  No side effects with tramadol are noted.  The quality characteristic and distribution of Tina Duarte low back pain and hip pain are otherwise essentially unchanged.  Tina Duarte is trying to stay active doing Tina Duarte stretching exercises.  The quality the pain remains an aching gnawing pain worse with prolonged standing or activity.  Outpatient Medications Prior to Visit  Medication Sig Dispense Refill   albuterol (ACCUNEB) 0.63 MG/3ML nebulizer solution Take 3 mLs (0.63 mg total) by nebulization every 6 (six) hours as needed for wheezing. 75 mL 3   albuterol (VENTOLIN HFA) 108 (90 Base) MCG/ACT inhaler Inhale 2 puffs into the lungs every 6 (six) hours as needed for wheezing or shortness of breath. 18 g 2   citalopram (CELEXA) 20 MG tablet Take 1 tablet (20 mg total) by mouth daily. 90 tablet 1   telmisartan (MICARDIS) 40 MG tablet TAKE ONE TABLET EVERY DAY 90 tablet 1   ELIQUIS 2.5 MG TABS tablet TAKE ONE TABLET TWICE DAILY 60 tablet 3   rosuvastatin (CRESTOR) 10 MG tablet Take 1 tablet (10 mg total) by mouth daily. 90 tablet 3   No facility-administered medications prior to visit.    Review of Systems CNS: No confusion or sedation Cardiac: No angina or palpitations GI: No abdominal pain or constipation Constitutional: No nausea vomiting fevers or chills  Objective:  LMP 04/29/1997    BP Readings from Last 3 Encounters:  08/11/22 126/70  05/30/22 121/69   05/02/22 (!) 151/95     Wt Readings from Last 3 Encounters:  08/11/22 226 lb (102.5 kg)  05/30/22 225 lb (102.1 kg)  05/02/22 224 lb (101.6 kg)     Physical Exam Pt is alert and oriented PERRL EOMI HEART IS RRR no murmur or rub LCTA no wheezing or rales MUSCULOSKELETAL reveals some paraspinous muscle tenderness in the lumbar region but no overt trigger points.  Tina Duarte has a antalgic gait.  Muscle tone and bulk is at baseline.  Labs  Lab Results  Component Value Date   HGBA1C 5.9 08/17/2022   HGBA1C 5.9 04/13/2022   HGBA1C 6.1 01/11/2022   Lab Results  Component Value Date   LDLCALC 57 08/17/2022   CREATININE 1.15 08/17/2022    -------------------------------------------------------------------------------------------------------------------- Lab Results  Component Value Date   WBC 8.2 08/17/2022   HGB 14.1 08/17/2022   HCT 43.4 08/17/2022   PLT 261.0 08/17/2022   GLUCOSE 84 08/17/2022   CHOL 133 08/17/2022   TRIG 171.0 (H) 08/17/2022   HDL 41.90 08/17/2022   LDLDIRECT 81.0 04/13/2022   LDLCALC 57 08/17/2022   ALT 10 08/17/2022   AST 19 08/17/2022   NA 140 08/17/2022   K 4.5 08/17/2022   CL 103 08/17/2022   CREATININE 1.15 08/17/2022   BUN 14 08/17/2022   CO2 30 08/17/2022   TSH 3.29 04/13/2022   INR 1.2 04/23/2019   HGBA1C 5.9  08/17/2022    --------------------------------------------------------------------------------------------------------------------- DG PAIN CLINIC C-ARM 1-60 MIN NO REPORT  Result Date: 05/02/2022 Fluoro was used, but no Radiologist interpretation will be provided. Please refer to "NOTES" tab for provider progress note.    Assessment & Plan:   Diagnoses and all orders for this visit:  Primary osteoarthritis of right knee  DDD (degenerative disc disease), lumbar  Facet arthritis of lumbar region  Hip pain, bilateral  Spinal stenosis of lumbar region with neurogenic claudication  Other orders -     traMADol (ULTRAM) 50  MG tablet; Take 1 tablet (50 mg total) by mouth 3 (three) times daily.        ----------------------------------------------------------------------------------------------------------------------  Problem List Items Addressed This Visit       Unprioritized   DDD (degenerative disc disease), lumbar   Relevant Medications   traMADol (ULTRAM) 50 MG tablet   Facet arthritis of lumbar region   Relevant Medications   traMADol (ULTRAM) 50 MG tablet   Spinal stenosis of lumbar region   Other Visit Diagnoses     Primary osteoarthritis of right knee    -  Primary   Relevant Medications   traMADol (ULTRAM) 50 MG tablet   Hip pain, bilateral             ----------------------------------------------------------------------------------------------------------------------  1. Primary osteoarthritis of right knee Continue follow-up with orthopedics.  2. DDD (degenerative disc disease), lumbar Continue working on core stretching exercises and trying to stay active.  Continue with efforts at strengthening and we will continue with tramadol for pain management.  This is working well and without side effects and enables Tina Duarte to stay active and functional.  3. Facet arthritis of lumbar region As above  4. Hip pain, bilateral   5. Spinal stenosis of lumbar region with neurogenic claudication     ----------------------------------------------------------------------------------------------------------------------  I am having Tina Duarte start on traMADol. I am also having Tina Duarte maintain Tina Duarte albuterol, albuterol, citalopram, and telmisartan.   Meds ordered this encounter  Medications   traMADol (ULTRAM) 50 MG tablet    Sig: Take 1 tablet (50 mg total) by mouth 3 (three) times daily.    Dispense:  90 tablet    Refill:  1   Patient's Medications  New Prescriptions   TRAMADOL (ULTRAM) 50 MG TABLET    Take 1 tablet (50 mg total) by mouth 3 (three) times daily.  Previous  Medications   ALBUTEROL (ACCUNEB) 0.63 MG/3ML NEBULIZER SOLUTION    Take 3 mLs (0.63 mg total) by nebulization every 6 (six) hours as needed for wheezing.   ALBUTEROL (VENTOLIN HFA) 108 (90 BASE) MCG/ACT INHALER    Inhale 2 puffs into the lungs every 6 (six) hours as needed for wheezing or shortness of breath.   CITALOPRAM (CELEXA) 20 MG TABLET    Take 1 tablet (20 mg total) by mouth daily.   TELMISARTAN (MICARDIS) 40 MG TABLET    TAKE ONE TABLET EVERY DAY  Modified Medications   Modified Medication Previous Medication   APIXABAN (ELIQUIS) 2.5 MG TABS TABLET ELIQUIS 2.5 MG TABS tablet      Take 1 tablet (2.5 mg total) by mouth 2 (two) times daily.    TAKE ONE TABLET TWICE DAILY   ROSUVASTATIN (CRESTOR) 10 MG TABLET rosuvastatin (CRESTOR) 10 MG tablet      Take 1 tablet (10 mg total) by mouth daily.    Take 1 tablet (10 mg total) by mouth daily.  Discontinued Medications   No medications on file   ----------------------------------------------------------------------------------------------------------------------  Follow-up: Return in about 2 months (around 10/11/2022) for evaluation, med refill.    Yevette Edwards, MD

## 2022-08-28 ENCOUNTER — Telehealth: Payer: Self-pay | Admitting: Internal Medicine

## 2022-08-28 NOTE — Telephone Encounter (Signed)
Ok.  Will need information.

## 2022-08-28 NOTE — Telephone Encounter (Signed)
Ok to schedule.

## 2022-08-28 NOTE — Telephone Encounter (Signed)
Pt daughter called stating the  provider told the pt that she would take her daughter on as a new pt

## 2022-08-29 NOTE — Telephone Encounter (Signed)
LMTCB. Please schedule daughter for new patient appt when she calls back.

## 2022-09-10 DIAGNOSIS — J449 Chronic obstructive pulmonary disease, unspecified: Secondary | ICD-10-CM | POA: Diagnosis not present

## 2022-09-13 ENCOUNTER — Other Ambulatory Visit: Payer: Self-pay | Admitting: Internal Medicine

## 2022-09-20 ENCOUNTER — Ambulatory Visit (INDEPENDENT_AMBULATORY_CARE_PROVIDER_SITE_OTHER): Payer: Medicare Other | Admitting: Emergency Medicine

## 2022-09-20 VITALS — Ht 64.0 in | Wt 225.0 lb

## 2022-09-20 DIAGNOSIS — M8580 Other specified disorders of bone density and structure, unspecified site: Secondary | ICD-10-CM | POA: Diagnosis not present

## 2022-09-20 DIAGNOSIS — Z0000 Encounter for general adult medical examination without abnormal findings: Secondary | ICD-10-CM

## 2022-09-20 DIAGNOSIS — Z780 Asymptomatic menopausal state: Secondary | ICD-10-CM

## 2022-09-20 DIAGNOSIS — Z0100 Encounter for examination of eyes and vision without abnormal findings: Secondary | ICD-10-CM

## 2022-09-20 NOTE — Patient Instructions (Addendum)
Tina Duarte , Thank you for taking time to come for your Medicare Wellness Visit. I appreciate your ongoing commitment to your health goals. Please review the following plan we discussed and let me know if I can assist you in the future.   Referrals/Orders/Follow-Ups/Clinician Recommendations: Get the flu shot in the fall. Recommend getting a tetanus and shingles vaccine. I placed a referral to [facility] for a routine eye exam. You should get a call from their office. I have placed an order for a bone density test. Call [facility] to schedule at [phone]. Also, schedule your mammogram at the same time as the bone density test.  This is a list of the screening recommended for you and due dates:  Health Maintenance  Topic Date Due   Zoster (Shingles) Vaccine (1 of 2) Never done   DEXA scan (bone density measurement)  07/12/2018   Mammogram  11/15/2021   Flu Shot  08/31/2022   Medicare Annual Wellness Visit  09/20/2023   Pneumonia Vaccine  Completed   Hepatitis C Screening  Completed   HPV Vaccine  Aged Out   DTaP/Tdap/Td vaccine  Discontinued   Colon Cancer Screening  Discontinued   COVID-19 Vaccine  Discontinued    Advanced directives: (ACP Link)Information on Advanced Care Planning can be found at [facility] Advance Health Care Directives Advance Health Care Directives ([email or web address]   Next Medicare Annual Wellness Visit scheduled for next year: Yes, 09/26/23 @ 3:00pm

## 2022-09-20 NOTE — Progress Notes (Addendum)
Subjective:   Tina Duarte is a 77 y.o. female who presents for Medicare Annual (Subsequent) preventive examination.  Visit Complete: Virtual  I connected with  Khilee Kasim Townsend on 09/20/22 by a audio enabled telemedicine application and verified that I am speaking with the correct person using two identifiers.  Patient Location: Home  Provider Location: Home Office  I discussed the limitations of evaluation and management by telemedicine. The patient expressed understanding and agreed to proceed.  Vital Signs: Because this visit was a virtual/telehealth visit, some criteria may be missing or patient reported. Any vitals not documented were not able to be obtained and vitals that have been documented are patient reported.    Review of Systems     Cardiac Risk Factors include: advanced age (>86men, >41 women);dyslipidemia;hypertension;obesity (BMI >30kg/m2);sedentary lifestyle     Objective:    Today's Vitals   09/20/22 1542  Weight: 225 lb (102.1 kg)  Height: 5\' 4"  (1.626 m)   Body mass index is 38.62 kg/m.     09/20/2022    3:55 PM 05/30/2022    3:19 PM 05/02/2022    1:47 PM 01/17/2022    3:10 PM 11/16/2021    1:08 PM 10/21/2020    4:19 PM 04/05/2020    1:43 PM  Advanced Directives  Does Patient Have a Medical Advance Directive? No No No No No No No  Would patient like information on creating a medical advance directive? Yes (MAU/Ambulatory/Procedural Areas - Information given)     No - Patient declined No - Patient declined    Current Medications (verified) Outpatient Encounter Medications as of 09/20/2022  Medication Sig   albuterol (ACCUNEB) 0.63 MG/3ML nebulizer solution Take 3 mLs (0.63 mg total) by nebulization every 6 (six) hours as needed for wheezing.   albuterol (VENTOLIN HFA) 108 (90 Base) MCG/ACT inhaler Inhale 2 puffs into the lungs every 6 (six) hours as needed for wheezing or shortness of breath.   apixaban (ELIQUIS) 2.5 MG TABS tablet Take 1 tablet (2.5 mg  total) by mouth 2 (two) times daily.   citalopram (CELEXA) 20 MG tablet TAKE ONE TABLET BY MOUTH EVERY DAY   rosuvastatin (CRESTOR) 10 MG tablet Take 1 tablet (10 mg total) by mouth daily.   telmisartan (MICARDIS) 40 MG tablet TAKE ONE TABLET EVERY DAY   traMADol (ULTRAM) 50 MG tablet Take 50 mg by mouth 3 (three) times daily as needed.   No facility-administered encounter medications on file as of 09/20/2022.    Allergies (verified) Patient has no known allergies.   History: Past Medical History:  Diagnosis Date   Allergy    Asthma    Chicken pox    COPD (chronic obstructive pulmonary disease) (HCC)    History of blood clots    Hypercholesterolemia    Hypertension    Retroperitoneal hematoma 03/2019   Past Surgical History:  Procedure Laterality Date   blood clots  2008   TUBAL LIGATION     Family History  Problem Relation Age of Onset   Cancer Mother        Breast   Heart disease Mother    Stroke Mother    Hypertension Mother    Breast cancer Mother        90's   Heart disease Father    Stroke Father    Hypertension Father    Diabetes Father    Colon cancer Other        paternal cousin   Social History  Socioeconomic History   Marital status: Widowed    Spouse name: Not on file   Number of children: 2   Years of education: Not on file   Highest education level: Not on file  Occupational History   Occupation: retired Education officer, environmental houses  Tobacco Use   Smoking status: Never    Passive exposure: Past   Smokeless tobacco: Never   Tobacco comments:    Exposed to chemical and 2nd had smoke.  Vaping Use   Vaping status: Never Used  Substance and Sexual Activity   Alcohol use: No    Alcohol/week: 0.0 standard drinks of alcohol   Drug use: No   Sexual activity: Not on file  Other Topics Concern   Not on file  Social History Narrative   Lives in [place] with son, 1 daughter who lives local.  never smoked; no alcohol. 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   Social Determinants of Health   Financial Resource Strain: Low Risk  (09/20/2022)   Overall Financial Resource Strain (CARDIA)    Difficulty of Paying Living Expenses: Not hard at all  Food Insecurity: No Food Insecurity (09/20/2022)   Hunger Vital Sign    Worried About Running Out of Food in the Last Year: Never true    Ran Out of Food in the Last Year: Never true  Transportation Needs: No Transportation Needs (09/20/2022)   PRAPARE - Administrator, Civil Service (Medical): No    Lack of Transportation (Non-Medical): No  Physical Activity: Inactive (09/20/2022)   Exercise Vital Sign    Days of Exercise per Week: 0 days    Minutes of Exercise per Session: 0 min  Stress: No Stress Concern Present (09/20/2022)   [employer] of Occupational Health - Occupational Stress Questionnaire    Feeling of Stress : Not at all  Social Connections: Socially Isolated (09/20/2022)   Social Connection and Isolation Panel [NHANES]    Frequency of Communication with Friends and Family: More than three times a week    Frequency of Social Gatherings with Friends and Family: More than three times a week    Attends Religious Services: Never    Database administrator or Organizations: No    Attends Banker Meetings: Never    Marital Status: Widowed    Tobacco Counseling Counseling given: Not Answered Tobacco comments: Exposed to chemical and 2nd had smoke.   Clinical Intake:  Pre-visit preparation completed: Yes  Pain : No/denies pain     BMI - recorded: 38.62 Nutritional Status: BMI > 30  Obese Nutritional Risks: None Diabetes: No  How often do you need to have someone help you when you read instructions, pamphlets, or other written materials from your doctor or pharmacy?: 1 - Never  Interpreter Needed?: No  Information entered by :: Tora Kindred, CMA   Activities of Daily Living    09/20/2022    3:45 PM  In your present state of health,  do you have any difficulty performing the following activities:  Hearing? 0  Vision? 0  Difficulty concentrating or making decisions? 0  Walking or climbing stairs? 1  Comment uses walker  Dressing or bathing? 0  Doing errands, shopping? 1  Comment does not drive, son drives for her  Preparing Food and eating ? N  Using the Toilet? N  In the past six months, have you accidently leaked urine? N  Do you have problems with loss of bowel control? N  Managing  your Medications? N  Managing your Finances? N  Housekeeping or managing your Housekeeping? N    Patient Care Team: Dale [place], MD as PCP - General (Internal Medicine)  Indicate any recent Medical Services you may have received from other than [facility] providers in the past year (date may be approximate).     Assessment:   This is a routine wellness examination for Keyes.  Hearing/Vision screen Hearing Screening - Comments:: Denies hearing loss Vision Screening - Comments:: Needs eye exam  Dietary issues and exercise activities discussed:     Goals Addressed               This Visit's Progress     Patient Stated (pt-stated)        Drink more water and eat healthier      COMPLETED: Weight (lb) < 230 lb (104.3 kg) (pt-stated)   225 lb (102.1 kg)     Healthy diet      Depression Screen    09/20/2022    3:53 PM 05/30/2022    3:19 PM 05/02/2022    1:47 PM 02/15/2022    2:56 PM 01/17/2022    3:09 PM 11/16/2021    1:08 PM 09/08/2021   12:30 PM  PHQ 2/9 Scores  PHQ - 2 Score 0 0 0 1 0 0 0  PHQ- 9 Score 0      1    Fall Risk    09/20/2022    3:57 PM 05/30/2022    3:19 PM 05/02/2022    1:47 PM 02/15/2022    2:56 PM 01/17/2022    3:09 PM  Fall Risk   Falls in the past year? 1 0 0 1 1  Number falls in past yr: 1   0 0  Injury with Fall? 1   1 0  Risk for fall due to : Impaired mobility;History of fall(s);Medication side effect   History of fall(s)   Follow up Falls prevention discussed;Falls evaluation completed    Falls evaluation completed     MEDICARE RISK AT HOME: Medicare Risk at Home Any stairs in or around the home?: Yes If so, are there any without handrails?: Yes Home free of loose throw rugs in walkways, pet beds, electrical cords, etc?: Yes Adequate lighting in your home to reduce risk of falls?: Yes Life alert?: No Use of a cane, walker or w/c?: Yes Grab bars in the bathroom?: Yes Shower chair or bench in shower?: Yes Elevated toilet seat or a handicapped toilet?: No  TIMED UP AND GO:  Was the test performed?  No    Cognitive Function:        09/20/2022    3:58 PM 10/21/2020    3:57 PM 08/20/2019   12:40 PM 08/19/2018    9:38 AM 08/14/2017   12:02 PM  6CIT Screen  What Year? 0 points 0 points 0 points 0 points 0 points  What month? 0 points 0 points 0 points 0 points 0 points  What time? 0 points 0 points  0 points 0 points  Count back from 20 0 points 0 points  0 points 0 points  Months in reverse 0 points 0 points 2 points 0 points 0 points  Repeat phrase 0 points  0 points 0 points 0 points  Total Score 0 points   0 points 0 points    Immunizations Immunization History  Administered Date(s) Administered   Fluad Quad(high Dose 65+) 12/11/2018, 01/11/2022   Influenza, High Dose Seasonal PF 04/10/2017  Influenza,inj,Quad PF,6+ Mos 10/21/2012, 01/07/2014, 03/07/2016   Influenza,inj,quad, With Preservative 03/02/2016   Influenza-Unspecified 02/28/2011, 12/16/2014, 09/28/2020   Pneumococcal Conjugate-13 04/30/2010   Pneumococcal Polysaccharide-23 05/23/2016    TDAP status: Due, Education has been provided regarding the importance of this vaccine. Advised may receive this vaccine at local pharmacy or Health Dept. Aware to provide a copy of the vaccination record if obtained from local pharmacy or Health Dept. Verbalized acceptance and understanding.  Flu Vaccine status: Due, Education has been provided regarding the importance of this vaccine. Advised may receive this  vaccine at local pharmacy or Health Dept. Aware to provide a copy of the vaccination record if obtained from local pharmacy or Health Dept. Verbalized acceptance and understanding.  Pneumococcal vaccine status: Up to date  Covid-19 vaccine status: Declined, Education has been provided regarding the importance of this vaccine but patient still declined. Advised may receive this vaccine at local pharmacy or Health Dept.or vaccine clinic. Aware to provide a copy of the vaccination record if obtained from local pharmacy or Health Dept. Verbalized acceptance and understanding.  Qualifies for Shingles Vaccine? Yes   Zostavax completed No   Shingrix Completed?: No.    Education has been provided regarding the importance of this vaccine. Patient has been advised to call insurance company to determine out of pocket expense if they have not yet received this vaccine. Advised may also receive vaccine at local pharmacy or Health Dept. Verbalized acceptance and understanding.  Screening Tests Health Maintenance  Topic Date Due   Zoster Vaccines- Shingrix (1 of 2) Never done   MAMMOGRAM  11/15/2021   INFLUENZA VACCINE  08/31/2022   Medicare Annual Wellness (AWV)  09/20/2023   Pneumonia Vaccine 67+ Years old  Completed   DEXA SCAN  Completed   Hepatitis C Screening  Completed   HPV VACCINES  Aged Out   DTaP/Tdap/Td  Discontinued   Colonoscopy  Discontinued   COVID-19 Vaccine  Discontinued    Health Maintenance  Health Maintenance Due  Topic Date Due   Zoster Vaccines- Shingrix (1 of 2) Never done   MAMMOGRAM  11/15/2021   INFLUENZA VACCINE  08/31/2022    Colorectal cancer screening: No longer required.   Mammogram status: Completed 11/15/20. Repeat every year  Bone Density status: Ordered 09/20/22. Pt provided with contact info and advised to call to schedule appt.  Lung Cancer Screening: (Low Dose CT Chest recommended if Age 84-80 years, 20 pack-year currently smoking OR have quit w/in  15years.) does not qualify.   Lung Cancer Screening Referral: n/a  Additional Screening:  Hepatitis C Screening: does not qualify; Completed 08/14/17  Vision Screening: Recommended annual ophthalmology exams for early detection of glaucoma and other disorders of the eye. Is the patient up to date with their annual eye exam?  No   If pt is not established with a provider, would they like to be referred to a provider to establish care? Yes .   Dental Screening: Recommended annual dental exams for proper oral hygiene   Community Resource Referral / Chronic Care Management: CRR required this visit?  No   CCM required this visit?  No     Plan:     I have personally reviewed and noted the following in the patient's chart:   Medical and social history Use of alcohol, tobacco or illicit drugs  Current medications and supplements including opioid prescriptions. Patient is not currently taking opioid prescriptions. Functional ability and status Nutritional status Physical activity Advanced directives List of other  physicians Hospitalizations, surgeries, and ER visits in previous 12 months Vitals Screenings to include cognitive, depression, and falls Referrals and appointments  In addition, I have reviewed and discussed with patient certain preventive protocols, quality metrics, and best practice recommendations. A written personalized care plan for preventive services as well as general preventive health recommendations were provided to patient.     Tora Kindred, CMA   09/20/2022   After Visit Summary: (MyChart) Due to this being a telephonic visit, the after visit summary with patients personalized plan was offered to patient via MyChart   Nurse Notes:  Referral placed to [facility] for routine eye exam. Order placed for a DEXA scan to have done when she has her MMG (order already placed). Gave phone# to call and schedule. Will get flu shot in the fall. Needs tetanus and  shingles vaccines.    I have reviewed the above information and agree with above.   Duncan Dull, MD

## 2022-09-25 ENCOUNTER — Other Ambulatory Visit (INDEPENDENT_AMBULATORY_CARE_PROVIDER_SITE_OTHER): Payer: Medicare Other

## 2022-09-25 DIAGNOSIS — N183 Chronic kidney disease, stage 3 unspecified: Secondary | ICD-10-CM

## 2022-09-26 LAB — URINALYSIS, ROUTINE W REFLEX MICROSCOPIC
Bilirubin Urine: NEGATIVE
Hgb urine dipstick: NEGATIVE
Ketones, ur: NEGATIVE
Nitrite: NEGATIVE
RBC / HPF: NONE SEEN (ref 0–?)
Specific Gravity, Urine: 1.01 (ref 1.000–1.030)
Total Protein, Urine: NEGATIVE
Urine Glucose: NEGATIVE
Urobilinogen, UA: 0.2 (ref 0.0–1.0)
pH: 6 (ref 5.0–8.0)

## 2022-09-26 LAB — BASIC METABOLIC PANEL
BUN: 13 mg/dL (ref 6–23)
CO2: 30 mEq/L (ref 19–32)
Calcium: 8.7 mg/dL (ref 8.4–10.5)
Chloride: 102 meq/L (ref 96–112)
Creatinine, Ser: 1.23 mg/dL — ABNORMAL HIGH (ref 0.40–1.20)
GFR: 41.96 mL/min — ABNORMAL LOW (ref >60.00)
Glucose, Bld: 116 mg/dL — ABNORMAL HIGH (ref 70–99)
Potassium: 4.1 meq/L (ref 3.5–5.1)
Sodium: 140 meq/L (ref 135–145)

## 2022-09-27 ENCOUNTER — Other Ambulatory Visit: Payer: Self-pay | Admitting: Internal Medicine

## 2022-09-28 ENCOUNTER — Other Ambulatory Visit: Payer: Self-pay | Admitting: *Deleted

## 2022-09-28 DIAGNOSIS — N183 Chronic kidney disease, stage 3 unspecified: Secondary | ICD-10-CM

## 2022-10-06 ENCOUNTER — Ambulatory Visit
Admission: RE | Admit: 2022-10-06 | Discharge: 2022-10-06 | Disposition: A | Discharge status: Home / Self Care | Payer: Medicare Other | Source: Ambulatory Visit | Attending: Internal Medicine | Admitting: Internal Medicine

## 2022-10-06 DIAGNOSIS — N183 Chronic kidney disease, stage 3 unspecified: Secondary | ICD-10-CM | POA: Diagnosis not present

## 2022-10-06 DIAGNOSIS — N281 Cyst of kidney, acquired: Secondary | ICD-10-CM | POA: Diagnosis not present

## 2022-10-10 ENCOUNTER — Ambulatory Visit: Payer: Medicare Other | Attending: Anesthesiology | Admitting: Anesthesiology

## 2022-10-10 ENCOUNTER — Encounter: Payer: Self-pay | Admitting: Anesthesiology

## 2022-10-10 DIAGNOSIS — M5136 Other intervertebral disc degeneration, lumbar region: Secondary | ICD-10-CM

## 2022-10-10 DIAGNOSIS — M47816 Spondylosis without myelopathy or radiculopathy, lumbar region: Secondary | ICD-10-CM

## 2022-10-10 DIAGNOSIS — M1711 Unilateral primary osteoarthritis, right knee: Secondary | ICD-10-CM

## 2022-10-10 DIAGNOSIS — M25552 Pain in left hip: Secondary | ICD-10-CM

## 2022-10-10 DIAGNOSIS — M48062 Spinal stenosis, lumbar region with neurogenic claudication: Secondary | ICD-10-CM

## 2022-10-10 DIAGNOSIS — M25551 Pain in right hip: Secondary | ICD-10-CM

## 2022-10-10 MED ORDER — TRAMADOL HCL 50 MG PO TABS: 50.0000 mg | ORAL_TABLET | Freq: Three times a day (TID) | ORAL | 2 refills | Status: AC | PRN

## 2022-10-10 NOTE — Progress Notes (Signed)
Multiple efforts were made to contact [facility] via telephone for her video conference.  Her mailbox was full and I will plan to refill her medication for the next month with a reschedule at the next available date in approximately 3 to 4 weeks.

## 2022-10-11 DIAGNOSIS — J449 Chronic obstructive pulmonary disease, unspecified: Secondary | ICD-10-CM | POA: Diagnosis not present

## 2022-10-11 DIAGNOSIS — H2513 Age-related nuclear cataract, bilateral: Secondary | ICD-10-CM | POA: Diagnosis not present

## 2022-10-19 ENCOUNTER — Other Ambulatory Visit (INDEPENDENT_AMBULATORY_CARE_PROVIDER_SITE_OTHER): Payer: Medicare Other

## 2022-10-19 DIAGNOSIS — N183 Chronic kidney disease, stage 3 unspecified: Secondary | ICD-10-CM | POA: Diagnosis not present

## 2022-10-19 LAB — BASIC METABOLIC PANEL
BUN: 14 mg/dL (ref 6–23)
CO2: 30 mEq/L (ref 19–32)
Calcium: 9.1 mg/dL (ref 8.4–10.5)
Chloride: 105 mEq/L (ref 96–112)
Creatinine, Ser: 1.05 mg/dL (ref 0.40–1.20)
GFR: 50.71 mL/min — ABNORMAL LOW (ref >60.00)
Glucose, Bld: 93 mg/dL (ref 70–99)
Potassium: 4.1 mEq/L (ref 3.5–5.1)
Sodium: 142 mEq/L (ref 135–145)

## 2022-11-08 ENCOUNTER — Encounter: Payer: Self-pay | Admitting: Anesthesiology

## 2022-11-08 ENCOUNTER — Ambulatory Visit: Payer: Medicare HMO | Attending: Anesthesiology | Admitting: Anesthesiology

## 2022-11-08 DIAGNOSIS — M1711 Unilateral primary osteoarthritis, right knee: Secondary | ICD-10-CM | POA: Diagnosis not present

## 2022-11-08 DIAGNOSIS — M47816 Spondylosis without myelopathy or radiculopathy, lumbar region: Secondary | ICD-10-CM

## 2022-11-08 DIAGNOSIS — M25552 Pain in left hip: Secondary | ICD-10-CM

## 2022-11-08 DIAGNOSIS — M25551 Pain in right hip: Secondary | ICD-10-CM

## 2022-11-08 DIAGNOSIS — M48062 Spinal stenosis, lumbar region with neurogenic claudication: Secondary | ICD-10-CM

## 2022-11-08 NOTE — Progress Notes (Signed)
Virtual Visit via Telephone Note  I connected with Tina Duarte on 11/08/22 at 11:20 AM EDT by telephone and verified that I am speaking with the correct person using two identifiers.  Location: Patient: Home Provider: Pain control center   I discussed the limitations, risks, security and privacy concerns of performing an evaluation and management service by telephone and the availability of in person appointments. I also discussed with the patient that there may be a patient responsible charge related to this service. The patient expressed understanding and agreed to proceed.   History of Present Illness: I spoke with Tina Duarte via telephone as we were unable link for the video portion the conference.  She reports that she has continued to do well taking the tramadol 50 mg tablets 1 in the morning and 1 at night.  She describes approximately a 50 to 70% pain relief with the medicine.  She is trying to stay active doing her stretching exercises but despite this continues to have diffuse body pain comparable to what she had previously described.  The intensity of the pain is better with the tramadol and she denies side effects of the medication.  Otherwise she is in her usual state of health at this time.  In the distant past she has had epidural steroid injection back in April of this year.  She described good relief with that and would like to hold off in any further injections until the pain intensifies.  She has tried to do stretching exercises as tolerated.  She denies any change in lower extremity strength function or bowel or bladder function.  No side effects of the medication are reported.  Review of systems: General: No fevers or chills Pulmonary: No shortness of breath or dyspnea Cardiac: No angina or palpitations or lightheadedness GI: No abdominal pain or constipation Psych: No depression    Observations/Objective:  Current Outpatient Medications:    albuterol (ACCUNEB) 0.63  MG/3ML nebulizer solution, Take 3 mLs (0.63 mg total) by nebulization every 6 (six) hours as needed for wheezing., Disp: 75 mL, Rfl: 3   albuterol (VENTOLIN HFA) 108 (90 Base) MCG/ACT inhaler, Inhale 2 puffs into the lungs every 6 (six) hours as needed for wheezing or shortness of breath., Disp: 18 g, Rfl: 2   apixaban (ELIQUIS) 2.5 MG TABS tablet, Take 1 tablet (2.5 mg total) by mouth 2 (two) times daily., Disp: 60 tablet, Rfl: 3   citalopram (CELEXA) 20 MG tablet, TAKE ONE TABLET BY MOUTH EVERY DAY, Disp: 90 tablet, Rfl: 1   rosuvastatin (CRESTOR) 10 MG tablet, Take 1 tablet (10 mg total) by mouth daily., Disp: 90 tablet, Rfl: 3   telmisartan (MICARDIS) 40 MG tablet, TAKE 1 TABLET BY MOUTH DAILY, Disp: 90 tablet, Rfl: 1   traMADol (ULTRAM) 50 MG tablet, Take 1 tablet (50 mg total) by mouth 3 (three) times daily as needed., Disp: 90 tablet, Rfl: 2   Past Medical History:  Diagnosis Date   Allergy    Asthma    Chicken pox    COPD (chronic obstructive pulmonary disease) (HCC)    History of blood clots    Hypercholesterolemia    Hypertension    Retroperitoneal hematoma 03/2019     Assessment and Plan: 1. Primary osteoarthritis of right knee   2. Spinal stenosis of lumbar region with neurogenic claudication   3. Hip pain, bilateral   4. Facet arthritis of lumbar region    Based on our conversation it is appropriate to refill her  medicines for the next few months.  Will schedule her for return to clinic in 3 months.  No other changes in her regimen will be initiated at this time.  Continue efforts at stretching strengthening.  We will defer on any repeat epidural injections at this time unless her symptoms intensify.  Continue follow-up with her primary care physicians for baseline medical care.  Follow Up Instructions:    I discussed the assessment and treatment plan with the patient. The patient was provided an opportunity to ask questions and all were answered. The patient agreed with  the plan and demonstrated an understanding of the instructions.   The patient was advised to call back or seek an in-person evaluation if the symptoms worsen or if the condition fails to improve as anticipated.  I provided 30 minutes of non-face-to-face time during this encounter.   Yevette Edwards, MD

## 2022-11-28 ENCOUNTER — Telehealth: Payer: Medicare HMO | Admitting: Internal Medicine

## 2022-11-28 ENCOUNTER — Encounter: Payer: Self-pay | Admitting: Internal Medicine

## 2022-11-28 ENCOUNTER — Telehealth: Payer: Self-pay | Admitting: Internal Medicine

## 2022-11-28 VITALS — BP 130/66 | HR 56 | Ht 64.0 in | Wt 225.0 lb

## 2022-11-28 DIAGNOSIS — J069 Acute upper respiratory infection, unspecified: Secondary | ICD-10-CM | POA: Insufficient documentation

## 2022-11-28 DIAGNOSIS — J00 Acute nasopharyngitis [common cold]: Secondary | ICD-10-CM

## 2022-11-28 MED ORDER — BENZONATATE 200 MG PO CAPS: 200.0000 mg | ORAL_CAPSULE | Freq: Three times a day (TID) | ORAL | 0 refills | Status: DC | PRN

## 2022-11-28 MED ORDER — PREDNISONE 10 MG PO TABS: ORAL_TABLET | ORAL | 0 refills | Status: DC

## 2022-11-28 MED ORDER — AZITHROMYCIN 500 MG PO TABS: 500.0000 mg | ORAL_TABLET | Freq: Every day | ORAL | 0 refills | Status: DC

## 2022-11-28 NOTE — Progress Notes (Signed)
Virtual Visit converted to Telephone  Note   This format is felt to be most appropriate for this patient at this time.  All issues noted in this document were discussed and addressed.  No physical exam was performed (except for noted visual exam findings with Video Visits).   I attempted to connect with  Tina Duarte  on 11/28/22 at  5:00 PM EDT by a video enabled telemedicine application and verified that I am speaking with the correct person using two identifiers. Location patient: home Location provider: work or home office Persons participating in the virtual visit: patient, provider  I discussed the limitations, risks, security and privacy concerns of performing an evaluation and management service by telephone and the availability of in person appointments. I also discussed with the patient that there may be a patient responsible charge related to this service. The patient expressed understanding and agreed to proceed.  Interactive audio and video telecommunications were attempted between this provider and patient, however failed, due to patient having technical difficulties OR patient did not have access to video capability.  We continued and completed visit with audio only.   Reason for visit: ear fullness,  sore throat, sinus drainage   HPI:  77 yr old female with hypertension, COPD with nocturnal hypoxemia requiring supplemental oxygen, presents with sore throat,  ear pressure  for the last several days ,  and sinus drainage . Sinus Symptoms and sore throat  started  this morning .  Has been short of breath but not wheezing, ,  started using her oxygen today     ROS: See pertinent positives and negatives per HPI.  Past Medical History:  Diagnosis Date   Allergy    Asthma    Chicken pox    COPD (chronic obstructive pulmonary disease) (HCC)    History of blood clots    Hypercholesterolemia    Hypertension    Retroperitoneal hematoma 03/2019    Past Surgical History:  Procedure  Laterality Date   blood clots  2008   TUBAL LIGATION      Family History  Problem Relation Age of Onset   Cancer Mother        Breast   Heart disease Mother    Stroke Mother    Hypertension Mother    Breast cancer Mother        54's   Heart disease Father    Stroke Father    Hypertension Father    Diabetes Father    Colon cancer Other        paternal cousin    SOCIAL HX:  reports that she has never smoked. She has been exposed to tobacco smoke. She has never used smokeless tobacco. She reports that she does not drink alcohol and does not use drugs.    Current Outpatient Medications:    albuterol (ACCUNEB) 0.63 MG/3ML nebulizer solution, Take 3 mLs (0.63 mg total) by nebulization every 6 (six) hours as needed for wheezing., Disp: 75 mL, Rfl: 3   albuterol (VENTOLIN HFA) 108 (90 Base) MCG/ACT inhaler, Inhale 2 puffs into the lungs every 6 (six) hours as needed for wheezing or shortness of breath., Disp: 18 g, Rfl: 2   apixaban (ELIQUIS) 2.5 MG TABS tablet, Take 1 tablet (2.5 mg total) by mouth 2 (two) times daily., Disp: 60 tablet, Rfl: 3   azithromycin (ZITHROMAX) 500 MG tablet, Take 1 tablet (500 mg total) by mouth daily., Disp: 7 tablet, Rfl: 0   benzonatate (TESSALON) 200 MG capsule, Take  1 capsule (200 mg total) by mouth 3 (three) times daily as needed for cough., Disp: 30 capsule, Rfl: 0   citalopram (CELEXA) 20 MG tablet, TAKE ONE TABLET BY MOUTH EVERY DAY, Disp: 90 tablet, Rfl: 1   predniSONE (DELTASONE) 10 MG tablet, 6 tablets on Day 1 , then reduce by 1 tablet daily until gone, Disp: 21 tablet, Rfl: 0   rosuvastatin (CRESTOR) 10 MG tablet, Take 1 tablet (10 mg total) by mouth daily., Disp: 90 tablet, Rfl: 3   telmisartan (MICARDIS) 40 MG tablet, TAKE 1 TABLET BY MOUTH DAILY, Disp: 90 tablet, Rfl: 1   traMADol (ULTRAM) 50 MG tablet, Take 1 tablet (50 mg total) by mouth 3 (three) times daily as needed., Disp: 90 tablet, Rfl: 2  EXAM:   General impression: alert,  cooperative and articulate.  No signs of being in distress  Lungs: speech is fluent sentence length suggests that patient is not short of breath and not punctuated by cough, sneezing or sniffing. Marland Kitchen   Psych: affect normal.  speech is articulate and non pressured .  Denies suicidal thoughts   ASSESSMENT AND PLAN: Acute nasopharyngitis Assessment & Plan: Treating aggressively given age and  h/o COPD.  Prednisone, azithromycin and tessalon perles.  Probiotic advised.    Other orders -     predniSONE; 6 tablets on Day 1 , then reduce by 1 tablet daily until gone  Dispense: 21 tablet; Refill: 0 -     Azithromycin; Take 1 tablet (500 mg total) by mouth daily.  Dispense: 7 tablet; Refill: 0 -     Benzonatate; Take 1 capsule (200 mg total) by mouth 3 (three) times daily as needed for cough.  Dispense: 30 capsule; Refill: 0      I discussed the assessment and treatment plan with the patient. The patient was provided an opportunity to ask questions and all were answered. The patient agreed with the plan and demonstrated an understanding of the instructions.   The patient was advised to call back or seek an in-person evaluation if the symptoms worsen or if the condition fails to improve as anticipated.   I spent 20 minutes dedicated to the care of this patient on the date of this telephone encounter to include pre-visit review of her medical history,  telephone time with the patient , and post visit ordering of testing and therapeutics.    Sherlene Shams, MD

## 2022-11-28 NOTE — Telephone Encounter (Signed)
Symptoms started today. Drainage, sore throat, left ear feels full. Has not tried anything OTC except for tylenol. Agreed to do a virtual visit. COVID negative today. Scheduled with Dr Darrick Huntsman.

## 2022-11-28 NOTE — Telephone Encounter (Signed)
Patient just called and said she has a sinus infection. She would like to if she can get some antibiotics and something for it. The pharmacy she use is [facility] - [place], [place] - [address] [place], [place] [address] Phone: [phone]  Fax: [phone]  Her number is [phone]. Can someone call her.

## 2022-11-28 NOTE — Assessment & Plan Note (Signed)
Treating aggressively given age and  h/o COPD.  Prednisone, azithromycin and tessalon perles.  Probiotic advised.

## 2022-12-14 ENCOUNTER — Ambulatory Visit (INDEPENDENT_AMBULATORY_CARE_PROVIDER_SITE_OTHER): Payer: Medicare HMO | Admitting: Internal Medicine

## 2022-12-14 VITALS — BP 122/74 | HR 81 | Temp 97.9°F | Resp 16 | Ht 64.0 in | Wt 226.0 lb

## 2022-12-14 DIAGNOSIS — D6859 Other primary thrombophilia: Secondary | ICD-10-CM | POA: Diagnosis not present

## 2022-12-14 DIAGNOSIS — I700 Atherosclerosis of aorta: Secondary | ICD-10-CM

## 2022-12-14 DIAGNOSIS — H9319 Tinnitus, unspecified ear: Secondary | ICD-10-CM

## 2022-12-14 DIAGNOSIS — D6851 Activated protein C resistance: Secondary | ICD-10-CM

## 2022-12-14 DIAGNOSIS — I10 Essential (primary) hypertension: Secondary | ICD-10-CM | POA: Diagnosis not present

## 2022-12-14 DIAGNOSIS — M48062 Spinal stenosis, lumbar region with neurogenic claudication: Secondary | ICD-10-CM | POA: Diagnosis not present

## 2022-12-14 DIAGNOSIS — E7800 Pure hypercholesterolemia, unspecified: Secondary | ICD-10-CM

## 2022-12-14 DIAGNOSIS — F439 Reaction to severe stress, unspecified: Secondary | ICD-10-CM

## 2022-12-14 DIAGNOSIS — Z23 Encounter for immunization: Secondary | ICD-10-CM | POA: Diagnosis not present

## 2022-12-14 DIAGNOSIS — R739 Hyperglycemia, unspecified: Secondary | ICD-10-CM | POA: Diagnosis not present

## 2022-12-14 LAB — HEPATIC FUNCTION PANEL
ALT: 13 U/L (ref 0–35)
AST: 20 U/L (ref 0–37)
Albumin: 4 g/dL (ref 3.5–5.2)
Alkaline Phosphatase: 58 U/L (ref 39–117)
Bilirubin, Direct: 0.3 mg/dL (ref 0.0–0.3)
Total Bilirubin: 1.3 mg/dL — ABNORMAL HIGH (ref 0.2–1.2)
Total Protein: 6.4 g/dL (ref 6.0–8.3)

## 2022-12-14 LAB — LIPID PANEL
Cholesterol: 135 mg/dL (ref 0–200)
HDL: 45.4 mg/dL (ref >39.00)
LDL Cholesterol: 66 mg/dL (ref 0–99)
NonHDL: 89.93
Total CHOL/HDL Ratio: 3
Triglycerides: 118 mg/dL (ref 0.0–149.0)
VLDL: 23.6 mg/dL (ref 0.0–40.0)

## 2022-12-14 LAB — BASIC METABOLIC PANEL
BUN: 13 mg/dL (ref 6–23)
CO2: 31 meq/L (ref 19–32)
Calcium: 8.8 mg/dL (ref 8.4–10.5)
Chloride: 101 meq/L (ref 96–112)
Creatinine, Ser: 0.91 mg/dL (ref 0.40–1.20)
GFR: 60.14 mL/min (ref >60.00)
Glucose, Bld: 92 mg/dL (ref 70–99)
Potassium: 4.2 meq/L (ref 3.5–5.1)
Sodium: 140 meq/L (ref 135–145)

## 2022-12-14 LAB — TSH: TSH: 4.79 u[IU]/mL (ref 0.35–5.50)

## 2022-12-14 LAB — HEMOGLOBIN A1C: Hgb A1c MFr Bld: 5.9 % (ref 4.6–6.5)

## 2022-12-14 MED ORDER — TELMISARTAN 40 MG PO TABS: 40.0000 mg | ORAL_TABLET | Freq: Every day | ORAL | 1 refills | Status: DC

## 2022-12-14 MED ORDER — APIXABAN 2.5 MG PO TABS: 2.5000 mg | ORAL_TABLET | Freq: Two times a day (BID) | ORAL | 1 refills | Status: DC

## 2022-12-14 MED ORDER — CITALOPRAM HYDROBROMIDE 20 MG PO TABS: 20.0000 mg | ORAL_TABLET | Freq: Every day | ORAL | 1 refills | Status: DC

## 2022-12-14 NOTE — Progress Notes (Signed)
Subjective:    [name], female    DOB: 15-Sep-1945, 77 y.o.   MRN: [id number]  Patient here for  Chief Complaint  Patient presents with   Medical Management of Chronic Issues    HPI Here to follow up regarding hypercholesterolemia, hypertension, hyperglycemia and COPD. Also f/u increased stress. On citalopram. Evaluated 11/28/22 (Dr Darrick Huntsman)- for sore throat and ear pressure. Also some sob. Treated with prednisone, azithromycin and tessalon perles. She is doing better. Cough is better.  Breathing at baseline.  No increased cough or congestion.  No abdominal pain or bowel change reported. She does report some persistent tinnitus and decreased hearing.  Discussed ENT referral. Increased stress.  Discussed.  She feels she is handling things relatively well.    Past Medical History:  Diagnosis Date   Allergy    Asthma    Chicken pox    COPD (chronic obstructive pulmonary disease) (HCC)    History of blood clots    Hypercholesterolemia    Hypertension    Retroperitoneal hematoma 03/2019   Past Surgical History:  Procedure Laterality Date   blood clots  2008   TUBAL LIGATION     Family History  Problem Relation Age of Onset   Cancer Mother        Breast   Heart disease Mother    Stroke Mother    Hypertension Mother    Breast cancer Mother        62's   Heart disease Father    Stroke Father    Hypertension Father    Diabetes Father    Colon cancer Other        paternal cousin   Social History   Socioeconomic History   Marital status: Widowed    Spouse name: Not on file   Number of children: 2   Years of education: Not on file   Highest education level: Not on file  Occupational History   Occupation: retired Education officer, environmental houses  Tobacco Use   Smoking status: Never    Passive exposure: Past   Smokeless tobacco: Never   Tobacco comments:    Exposed to chemical and 2nd had smoke.  Vaping Use   Vaping status: Never Used  Substance and Sexual Activity    Alcohol use: No    Alcohol/week: 0.0 standard drinks of alcohol   Drug use: No   Sexual activity: Not on file  Other Topics Concern   Not on file  Social History Narrative   Lives in [place] with son, 1 daughter who lives local.  never smoked; no alcohol. 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   Social Determinants of Health   Financial Resource Strain: Low Risk  (09/20/2022)   Overall Financial Resource Strain (CARDIA)    Difficulty of Paying Living Expenses: Not hard at all  Food Insecurity: No Food Insecurity (09/20/2022)   Hunger Vital Sign    Worried About Running Out of Food in the Last Year: Never true    Ran Out of Food in the Last Year: Never true  Transportation Needs: No Transportation Needs (09/20/2022)   PRAPARE - Administrator, Civil Service (Medical): No    Lack of Transportation (Non-Medical): No  Physical Activity: Inactive (09/20/2022)   Exercise Vital Sign    Days of Exercise per Week: 0 days    Minutes of Exercise per Session: 0 min  Stress: No Stress Concern Present (09/20/2022)   [employer] of Occupational Health -  Occupational Stress Questionnaire    Feeling of Stress : Not at all  Social Connections: Socially Isolated (09/20/2022)   Social Connection and Isolation Panel [NHANES]    Frequency of Communication with Friends and Family: More than three times a week    Frequency of Social Gatherings with Friends and Family: More than three times a week    Attends Religious Services: Never    Database administrator or Organizations: No    Attends Banker Meetings: Never    Marital Status: Widowed     Review of Systems  Constitutional:  Negative for appetite change and unexpected weight change.  HENT:  Positive for hearing loss and tinnitus. Negative for congestion and sinus pressure.   Respiratory:  Negative for cough, chest tightness and shortness of breath.   Cardiovascular:  Negative for chest pain and palpitations.        No increased swelling.   Gastrointestinal:  Negative for abdominal pain, diarrhea, nausea and vomiting.  Genitourinary:  Negative for difficulty urinating and dysuria.  Musculoskeletal:  Negative for back pain and myalgias.  Skin:  Negative for color change and rash.  Neurological:  Negative for dizziness and headaches.  Psychiatric/Behavioral:  Negative for agitation and dysphoric mood.        Objective:     BP 122/74   Pulse 81   Temp 97.9 F (36.6 C)   Resp 16   Ht 5\' 4"  (1.626 m)   Wt 226 lb (102.5 kg)   LMP 04/29/1997   SpO2 96%   BMI 38.79 kg/m  Wt Readings from Last 3 Encounters:  12/14/22 226 lb (102.5 kg)  11/28/22 225 lb (102.1 kg)  09/20/22 225 lb (102.1 kg)    Physical Exam Vitals reviewed.  Constitutional:      General: She is not in acute distress.    Appearance: Normal appearance.  HENT:     Head: Normocephalic and atraumatic.     Right Ear: Tympanic membrane, ear canal and external ear normal.     Left Ear: Tympanic membrane, ear canal and external ear normal.     Mouth/Throat:     Pharynx: No oropharyngeal exudate or posterior oropharyngeal erythema.  Eyes:     General: No scleral icterus.       Right eye: No discharge.        Left eye: No discharge.     Conjunctiva/sclera: Conjunctivae normal.  Neck:     Thyroid: No thyromegaly.  Cardiovascular:     Rate and Rhythm: Normal rate and regular rhythm.  Pulmonary:     Effort: No respiratory distress.     Breath sounds: Normal breath sounds. No wheezing.  Abdominal:     General: Bowel sounds are normal.     Palpations: Abdomen is soft.     Tenderness: There is no abdominal tenderness.  Musculoskeletal:        General: No swelling or tenderness.     Cervical back: Neck supple. No tenderness.  Lymphadenopathy:     Cervical: No cervical adenopathy.  Skin:    Findings: No erythema or rash.  Neurological:     Mental Status: She is alert.  Psychiatric:        Mood and Affect: Mood  normal.        Behavior: Behavior normal.      Outpatient Encounter Medications as of 12/14/2022  Medication Sig   albuterol (ACCUNEB) 0.63 MG/3ML nebulizer solution Take 3 mLs (0.63 mg total) by nebulization every 6 (  six) hours as needed for wheezing.   albuterol (VENTOLIN HFA) 108 (90 Base) MCG/ACT inhaler Inhale 2 puffs into the lungs every 6 (six) hours as needed for wheezing or shortness of breath.   apixaban (ELIQUIS) 2.5 MG TABS tablet Take 1 tablet (2.5 mg total) by mouth 2 (two) times daily.   citalopram (CELEXA) 20 MG tablet Take 1 tablet (20 mg total) by mouth daily.   rosuvastatin (CRESTOR) 10 MG tablet Take 1 tablet (10 mg total) by mouth daily.   telmisartan (MICARDIS) 40 MG tablet Take 1 tablet (40 mg total) by mouth daily.   traMADol (ULTRAM) 50 MG tablet Take 1 tablet (50 mg total) by mouth 3 (three) times daily as needed.   [DISCONTINUED] apixaban (ELIQUIS) 2.5 MG TABS tablet Take 1 tablet (2.5 mg total) by mouth 2 (two) times daily.   [DISCONTINUED] azithromycin (ZITHROMAX) 500 MG tablet Take 1 tablet (500 mg total) by mouth daily.   [DISCONTINUED] benzonatate (TESSALON) 200 MG capsule Take 1 capsule (200 mg total) by mouth 3 (three) times daily as needed for cough.   [DISCONTINUED] citalopram (CELEXA) 20 MG tablet TAKE ONE TABLET BY MOUTH EVERY DAY   [DISCONTINUED] predniSONE (DELTASONE) 10 MG tablet 6 tablets on Day 1 , then reduce by 1 tablet daily until gone   [DISCONTINUED] telmisartan (MICARDIS) 40 MG tablet TAKE 1 TABLET BY MOUTH DAILY   No facility-administered encounter medications on file as of 12/14/2022.     Lab Results  Component Value Date   WBC 8.2 08/17/2022   HGB 14.1 08/17/2022   HCT 43.4 08/17/2022   PLT 261.0 08/17/2022   GLUCOSE 92 12/14/2022   CHOL 135 12/14/2022   TRIG 118.0 12/14/2022   HDL 45.40 12/14/2022   LDLDIRECT 81.0 04/13/2022   LDLCALC 66 12/14/2022   ALT 13 12/14/2022   AST 20 12/14/2022   NA 140 12/14/2022   K 4.2  12/14/2022   CL 101 12/14/2022   CREATININE 0.91 12/14/2022   BUN 13 12/14/2022   CO2 31 12/14/2022   TSH 4.79 12/14/2022   INR 1.2 04/23/2019   HGBA1C 5.9 12/14/2022    US RENAL  Result Date: 10/18/2022 CLINICAL DATA:  Impaired renal function EXAM: RENAL / URINARY TRACT ULTRASOUND COMPLETE COMPARISON:  None Available. FINDINGS: Right Kidney: Length: 8.9 cm. Echogenicity within normal limits. Midpole cyst measures 6 mm. No suspicious mass or hydronephrosis visualized. Left Kidney: Length: 10.2 cm. Echogenicity within normal limits. Midpole cyst measures 14 mm. No suspicious mass or hydronephrosis visualized. Bladder: Appears normal for degree of bladder distention. IMPRESSION: Small bilateral renal cysts.  Otherwise unremarkable study. Electronically Signed   By: Layla Maw M.D.   On: 10/18/2022 08:52       Assessment & Plan:  Hypercholesterolemia Assessment & Plan: Continue crestor.  Low cholesterol diet and exercise.  Follow lipid panel and liver function tests.    Orders: -     Hepatic function panel -     Lipid panel -     TSH  Hyperglycemia Assessment & Plan: Low carb diet and exercise.  Follow met b and a1c.   Orders: -     Hemoglobin A1c  Essential hypertension, benign Assessment & Plan:  On micardis 40mg  q day.  Follow pressures.  Follow metabolic panel.   Orders: -     Basic metabolic panel  Tinnitus, unspecified laterality Assessment & Plan: Tinnitus and decreased hearing.  Refer to ENT for evaluation.   Orders: -     Ambulatory referral  to ENT  Need for influenza vaccination -     Flu Vaccine Trivalent High Dose (Fluad)  Stress Assessment & Plan: Continues on citalopram.  Does not feel needs any further intervention. Follow.    Spinal stenosis of lumbar region with neurogenic claudication Assessment & Plan: Being followed by pain clinic.    Primary hypercoagulable state [facility]) Assessment & Plan: Continue eliquis as outlined.  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  Recommended indefinite anticoagulation.     Factor V Leiden [facility]) Assessment & Plan: On eliquis.  Discussed with hematology - ok to stop for colonoscopy.     Aortic atherosclerosis (HCC) Assessment & Plan: Continue crestor.    Other orders -     Apixaban; Take 1 tablet (2.5 mg total) by mouth 2 (two) times daily.  Dispense: 180 tablet; Refill: 1 -     Citalopram Hydrobromide; Take 1 tablet (20 mg total) by mouth daily.  Dispense: 90 tablet; Refill: 1 -     Telmisartan; Take 1 tablet (40 mg total) by mouth daily.  Dispense: 90 tablet; Refill: 1     Dale [place], MD

## 2022-12-15 ENCOUNTER — Other Ambulatory Visit: Payer: Self-pay

## 2022-12-17 ENCOUNTER — Encounter: Payer: Self-pay | Admitting: Internal Medicine

## 2022-12-17 DIAGNOSIS — H9319 Tinnitus, unspecified ear: Secondary | ICD-10-CM | POA: Insufficient documentation

## 2022-12-17 NOTE — Assessment & Plan Note (Signed)
Being followed by pain clinic.   

## 2022-12-17 NOTE — Assessment & Plan Note (Signed)
Low carb diet and exercise.  Follow met b and a1c.   

## 2022-12-17 NOTE — Assessment & Plan Note (Signed)
Continue crestor.  Low cholesterol diet and exercise.  Follow lipid panel and liver function tests.  

## 2022-12-17 NOTE — Assessment & Plan Note (Signed)
Continues on citalopram.  Does not feel needs any further intervention. Follow.

## 2022-12-17 NOTE — Assessment & Plan Note (Signed)
Tinnitus and decreased hearing. Refer to ENT for evaluation.

## 2022-12-17 NOTE — Assessment & Plan Note (Signed)
Continue crestor 

## 2022-12-17 NOTE — Assessment & Plan Note (Signed)
Continue eliquis as outlined.  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  Recommended indefinite anticoagulation.   

## 2022-12-17 NOTE — Assessment & Plan Note (Signed)
On eliquis.  Discussed with hematology - ok to stop for colonoscopy.   

## 2022-12-17 NOTE — Assessment & Plan Note (Signed)
On micardis 40mg q day.  Follow pressures.  Follow metabolic panel.  

## 2022-12-21 DIAGNOSIS — Z860100 Personal history of colon polyps, unspecified: Secondary | ICD-10-CM | POA: Diagnosis not present

## 2022-12-21 DIAGNOSIS — R1310 Dysphagia, unspecified: Secondary | ICD-10-CM | POA: Diagnosis not present

## 2023-01-08 ENCOUNTER — Telehealth: Payer: Self-pay | Admitting: Internal Medicine

## 2023-01-08 DIAGNOSIS — Z1231 Encounter for screening mammogram for malignant neoplasm of breast: Secondary | ICD-10-CM

## 2023-01-08 NOTE — Telephone Encounter (Signed)
Pt declined mammo at her appt- wanting to wait until she does not have as much going on then she will schedule. Diagnostic was ordered. Do you want me to cancel and change to screening?

## 2023-01-08 NOTE — Addendum Note (Signed)
Addended by: Rita Ohara D on: 01/08/2023 09:13 AM   Modules accepted: Orders

## 2023-01-08 NOTE — Telephone Encounter (Signed)
Order changed.

## 2023-01-08 NOTE — Telephone Encounter (Signed)
Received notification she is overdue a mammogram.  Need to schedule.  Order placed. Thanks.

## 2023-01-08 NOTE — Telephone Encounter (Signed)
Yes pleas cancel diagnostic.  Ordered in error.

## 2023-01-10 ENCOUNTER — Encounter: Payer: Self-pay | Admitting: Anesthesiology

## 2023-01-10 ENCOUNTER — Ambulatory Visit: Payer: Medicare HMO | Attending: Anesthesiology | Admitting: Anesthesiology

## 2023-01-10 DIAGNOSIS — M47816 Spondylosis without myelopathy or radiculopathy, lumbar region: Secondary | ICD-10-CM | POA: Diagnosis not present

## 2023-01-10 DIAGNOSIS — M1711 Unilateral primary osteoarthritis, right knee: Secondary | ICD-10-CM | POA: Diagnosis not present

## 2023-01-10 DIAGNOSIS — M25551 Pain in right hip: Secondary | ICD-10-CM

## 2023-01-10 DIAGNOSIS — M25552 Pain in left hip: Secondary | ICD-10-CM | POA: Diagnosis not present

## 2023-01-10 DIAGNOSIS — M48062 Spinal stenosis, lumbar region with neurogenic claudication: Secondary | ICD-10-CM

## 2023-01-10 MED ORDER — TRAMADOL HCL 50 MG PO TABS: 50.0000 mg | ORAL_TABLET | Freq: Three times a day (TID) | ORAL | 2 refills | Status: DC

## 2023-01-10 NOTE — Progress Notes (Signed)
Virtual Visit via Telephone Note  I connected with Tina Duarte on 01/10/23 at 11:00 AM EST by telephone and verified that I am speaking with the correct person using two identifiers.  Location: Patient: Home Provider: Pain control center   I discussed the limitations, risks, security and privacy concerns of performing an evaluation and management service by telephone and the availability of in person appointments. I also discussed with the patient that there may be a patient responsible charge related to this service. The patient expressed understanding and agreed to proceed.   History of Present Illness: I spoke with Tina Duarte via telephone as we were unable link for the video portion of the conference.  She reports that she is doing well with her current tramadol dosing.  She takes this medication 3 times a day effectively.  She generally gets about 75% relief of her low back pain.  The quality characteristic and distribution of the pain is stable with no recent changes.  She describes it is a gnawing aching pain worse with any type of prolonged standing.  She gets some hip pain but no radiating pain or sciatica symptoms at present.  The pain is worse with standing and rotational motion.  When she takes the tramadol she generally gets about 6 hours of relief before she has recurrence of the same pain.  No side effects with the medication are reported.  No change in lower extremity strength function or bowel or bladder function are noted.  Review of systems: General: No fevers or chills Pulmonary: No shortness of breath or dyspnea Cardiac: No angina or palpitations or lightheadedness GI: No abdominal pain or constipation Psych: No depression    Observations/Objective:  Current Outpatient Medications:    traMADol (ULTRAM) 50 MG tablet, Take 1 tablet (50 mg total) by mouth 3 (three) times daily., Disp: 90 tablet, Rfl: 2   albuterol (ACCUNEB) 0.63 MG/3ML nebulizer solution, Take 3 mLs (0.63  mg total) by nebulization every 6 (six) hours as needed for wheezing., Disp: 75 mL, Rfl: 3   albuterol (VENTOLIN HFA) 108 (90 Base) MCG/ACT inhaler, Inhale 2 puffs into the lungs every 6 (six) hours as needed for wheezing or shortness of breath., Disp: 18 g, Rfl: 2   apixaban (ELIQUIS) 2.5 MG TABS tablet, Take 1 tablet (2.5 mg total) by mouth 2 (two) times daily., Disp: 180 tablet, Rfl: 1   citalopram (CELEXA) 20 MG tablet, Take 1 tablet (20 mg total) by mouth daily., Disp: 90 tablet, Rfl: 1   rosuvastatin (CRESTOR) 10 MG tablet, Take 1 tablet (10 mg total) by mouth daily., Disp: 90 tablet, Rfl: 3   telmisartan (MICARDIS) 40 MG tablet, Take 1 tablet (40 mg total) by mouth daily., Disp: 90 tablet, Rfl: 1   Past Medical History:  Diagnosis Date   Allergy    Asthma    Chicken pox    COPD (chronic obstructive pulmonary disease) (HCC)    History of blood clots    Hypercholesterolemia    Hypertension    Retroperitoneal hematoma 03/2019   Assessment and Plan:  1. Primary osteoarthritis of right knee   2. Spinal stenosis of lumbar region with neurogenic claudication   3. Hip pain, bilateral   4. Facet arthritis of lumbar region    Based on our conversation it is appropriate to refill her medicines for the next 3 months.  I have reviewed the [facility] practitioner database information and it is appropriate.  I have encouraged her to continue with  stretching strengthening exercises and ambulation as tolerated.  She is doing well with tramadol for pain relief.  The quality of pain has been stable but she is getting good relief with this.  No further changes in her pharmacologic regimen will be initiated.  Continue follow-up with her primary care physicians.  Routine medical care with return to clinic in 3 months. Follow Up Instructions:    I discussed the assessment and treatment plan with the patient. The patient was provided an opportunity to ask questions and all were answered. The patient  agreed with the plan and demonstrated an understanding of the instructions.   The patient was advised to call back or seek an in-person evaluation if the symptoms worsen or if the condition fails to improve as anticipated.  I provided 30 minutes of non-face-to-face time during this encounter.   Yevette Edwards, MD

## 2023-01-11 DIAGNOSIS — H903 Sensorineural hearing loss, bilateral: Secondary | ICD-10-CM | POA: Diagnosis not present

## 2023-01-12 ENCOUNTER — Other Ambulatory Visit: Payer: Medicare HMO

## 2023-01-12 NOTE — Addendum Note (Signed)
Addended by: Jarvis Morgan D on: 01/12/2023 02:05 PM   Modules accepted: Orders

## 2023-01-22 ENCOUNTER — Other Ambulatory Visit (INDEPENDENT_AMBULATORY_CARE_PROVIDER_SITE_OTHER): Payer: Medicare HMO

## 2023-01-23 LAB — HEPATIC FUNCTION PANEL
ALT: 9 [IU]/L (ref 0–32)
AST: 17 [IU]/L (ref 0–40)
Albumin: 4.4 g/dL (ref 3.8–4.8)
Alkaline Phosphatase: 84 [IU]/L (ref 44–121)
Bilirubin Total: 1.2 mg/dL (ref 0.0–1.2)
Bilirubin, Direct: 0.32 mg/dL (ref 0.00–0.40)
Total Protein: 6.8 g/dL (ref 6.0–8.5)

## 2023-02-20 ENCOUNTER — Ambulatory Visit (INDEPENDENT_AMBULATORY_CARE_PROVIDER_SITE_OTHER): Payer: Medicare HMO | Admitting: Internal Medicine

## 2023-02-20 VITALS — BP 120/70 | HR 82 | Temp 98.0°F | Resp 16 | Ht 64.0 in | Wt 215.2 lb

## 2023-02-20 DIAGNOSIS — I10 Essential (primary) hypertension: Secondary | ICD-10-CM | POA: Diagnosis not present

## 2023-02-20 DIAGNOSIS — I700 Atherosclerosis of aorta: Secondary | ICD-10-CM

## 2023-02-20 DIAGNOSIS — J4520 Mild intermittent asthma, uncomplicated: Secondary | ICD-10-CM | POA: Diagnosis not present

## 2023-02-20 DIAGNOSIS — D6851 Activated protein C resistance: Secondary | ICD-10-CM

## 2023-02-20 DIAGNOSIS — Z86718 Personal history of other venous thrombosis and embolism: Secondary | ICD-10-CM

## 2023-02-20 DIAGNOSIS — E7800 Pure hypercholesterolemia, unspecified: Secondary | ICD-10-CM

## 2023-02-20 DIAGNOSIS — R739 Hyperglycemia, unspecified: Secondary | ICD-10-CM

## 2023-02-20 MED ORDER — TELMISARTAN 40 MG PO TABS: 40.0000 mg | ORAL_TABLET | Freq: Every day | ORAL | 1 refills | Status: DC

## 2023-02-20 MED ORDER — CITALOPRAM HYDROBROMIDE 20 MG PO TABS: 20.0000 mg | ORAL_TABLET | Freq: Every day | ORAL | 1 refills | Status: DC

## 2023-02-20 MED ORDER — APIXABAN 2.5 MG PO TABS: 2.5000 mg | ORAL_TABLET | Freq: Two times a day (BID) | ORAL | 1 refills | Status: DC

## 2023-02-20 NOTE — Progress Notes (Signed)
Subjective:    [name], female    DOB: 1945-11-16, 78 y.o.   MRN: [id number]  Patient here for  Chief Complaint  Patient presents with   Pre-op Exam    HPI Here as a work in - clearance for colonoscopy. Requested clearance to stop eliquis. She reports she feels she is doing relatively well. Denies chest pain. Feels breathing is stable. Denies any increased sob. No increased cough or congestion. No abdominal pain or bowel change reported. Discussed upcoming colonoscopy. She is aware of need to stop eliquis and is aware of risk of stopping eliquis. She has stopped eliquis previously for back injections. Have discussed with hematology.    Past Medical History:  Diagnosis Date   Allergy    Asthma    Chicken pox    COPD (chronic obstructive pulmonary disease) (HCC)    History of blood clots    Hypercholesterolemia    Hypertension    Retroperitoneal hematoma 03/2019   Past Surgical History:  Procedure Laterality Date   blood clots  2008   TUBAL LIGATION     Family History  Problem Relation Age of Onset   Cancer Mother        Breast   Heart disease Mother    Stroke Mother    Hypertension Mother    Breast cancer Mother        4's   Heart disease Father    Stroke Father    Hypertension Father    Diabetes Father    Colon cancer Other        paternal cousin   Social History   Socioeconomic History   Marital status: Widowed    Spouse name: Not on file   Number of children: 2   Years of education: Not on file   Highest education level: Not on file  Occupational History   Occupation: retired Education officer, environmental houses  Tobacco Use   Smoking status: Never    Passive exposure: Past   Smokeless tobacco: Never   Tobacco comments:    Exposed to chemical and 2nd had smoke.  Vaping Use   Vaping status: Never Used  Substance and Sexual Activity   Alcohol use: No    Alcohol/week: 0.0 standard drinks of alcohol   Drug use: No   Sexual activity: Not on file  Other  Topics Concern   Not on file  Social History Narrative   Lives in [place] with son, 1 daughter who lives local.  never smoked; no alcohol. 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   Social Drivers of Health   Financial Resource Strain: Low Risk  (09/20/2022)   Overall Financial Resource Strain (CARDIA)    Difficulty of Paying Living Expenses: Not hard at all  Food Insecurity: No Food Insecurity (09/20/2022)   Hunger Vital Sign    Worried About Running Out of Food in the Last Year: Never true    Ran Out of Food in the Last Year: Never true  Transportation Needs: No Transportation Needs (09/20/2022)   PRAPARE - Administrator, Civil Service (Medical): No    Lack of Transportation (Non-Medical): No  Physical Activity: Inactive (09/20/2022)   Exercise Vital Sign    Days of Exercise per Week: 0 days    Minutes of Exercise per Session: 0 min  Stress: No Stress Concern Present (09/20/2022)   [employer] of Occupational Health - Occupational Stress Questionnaire    Feeling of Stress : Not at all  Social Connections: Socially Isolated (09/20/2022)   Social Connection and Isolation Panel [NHANES]    Frequency of Communication with Friends and Family: More than three times a week    Frequency of Social Gatherings with Friends and Family: More than three times a week    Attends Religious Services: Never    Database administrator or Organizations: No    Attends Banker Meetings: Never    Marital Status: Widowed     Review of Systems  Constitutional:  Negative for appetite change and unexpected weight change.  HENT:  Negative for congestion and sinus pressure.   Respiratory:  Negative for cough, chest tightness and shortness of breath.   Cardiovascular:  Negative for chest pain and palpitations.       No increased leg swelling.   Gastrointestinal:  Negative for abdominal pain, diarrhea, nausea and vomiting.  Genitourinary:  Negative for difficulty  urinating and dysuria.  Musculoskeletal:  Negative for joint swelling and myalgias.  Skin:  Negative for color change and rash.  Neurological:  Negative for dizziness and headaches.  Psychiatric/Behavioral:  Negative for agitation and dysphoric mood.        Objective:     BP 120/70   Pulse 82   Temp 98 F (36.7 C)   Resp 16   Ht 5\' 4"  (1.626 m)   Wt 215 lb 3.2 oz (97.6 kg)   LMP 04/29/1997   SpO2 96%   BMI 36.94 kg/m  Wt Readings from Last 3 Encounters:  02/20/23 215 lb 3.2 oz (97.6 kg)  12/14/22 226 lb (102.5 kg)  11/28/22 225 lb (102.1 kg)    Physical Exam Vitals reviewed.  Constitutional:      General: She is not in acute distress.    Appearance: Normal appearance.  HENT:     Head: Normocephalic and atraumatic.     Right Ear: External ear normal.     Left Ear: External ear normal.     Mouth/Throat:     Pharynx: No oropharyngeal exudate or posterior oropharyngeal erythema.  Eyes:     General: No scleral icterus.       Right eye: No discharge.        Left eye: No discharge.     Conjunctiva/sclera: Conjunctivae normal.  Neck:     Thyroid: No thyromegaly.  Cardiovascular:     Rate and Rhythm: Normal rate and regular rhythm.  Pulmonary:     Effort: No respiratory distress.     Breath sounds: Normal breath sounds. No wheezing.  Abdominal:     General: Bowel sounds are normal.     Palpations: Abdomen is soft.     Tenderness: There is no abdominal tenderness.  Musculoskeletal:        General: No tenderness.     Cervical back: Neck supple. No tenderness.     Comments: No increased swelling - lower extremities.   Lymphadenopathy:     Cervical: No cervical adenopathy.  Skin:    Findings: No erythema or rash.  Neurological:     Mental Status: She is alert.  Psychiatric:        Mood and Affect: Mood normal.        Behavior: Behavior normal.         Outpatient Encounter Medications as of 02/20/2023  Medication Sig   albuterol (ACCUNEB) 0.63 MG/3ML  nebulizer solution Take 3 mLs (0.63 mg total) by nebulization every 6 (six) hours as needed for wheezing.   albuterol (VENTOLIN HFA) 108 (  90 Base) MCG/ACT inhaler Inhale 2 puffs into the lungs every 6 (six) hours as needed for wheezing or shortness of breath.   apixaban (ELIQUIS) 2.5 MG TABS tablet Take 1 tablet (2.5 mg total) by mouth 2 (two) times daily.   citalopram (CELEXA) 20 MG tablet Take 1 tablet (20 mg total) by mouth daily.   rosuvastatin (CRESTOR) 10 MG tablet Take 1 tablet (10 mg total) by mouth daily.   telmisartan (MICARDIS) 40 MG tablet Take 1 tablet (40 mg total) by mouth daily.   traMADol (ULTRAM) 50 MG tablet Take 1 tablet (50 mg total) by mouth 3 (three) times daily.   [DISCONTINUED] apixaban (ELIQUIS) 2.5 MG TABS tablet Take 1 tablet (2.5 mg total) by mouth 2 (two) times daily.   [DISCONTINUED] citalopram (CELEXA) 20 MG tablet Take 1 tablet (20 mg total) by mouth daily.   [DISCONTINUED] telmisartan (MICARDIS) 40 MG tablet Take 1 tablet (40 mg total) by mouth daily.   No facility-administered encounter medications on file as of 02/20/2023.     Lab Results  Component Value Date   WBC 8.2 08/17/2022   HGB 14.1 08/17/2022   HCT 43.4 08/17/2022   PLT 261.0 08/17/2022   GLUCOSE 92 12/14/2022   CHOL 135 12/14/2022   TRIG 118.0 12/14/2022   HDL 45.40 12/14/2022   LDLDIRECT 81.0 04/13/2022   LDLCALC 66 12/14/2022   ALT 9 01/22/2023   AST 17 01/22/2023   NA 140 12/14/2022   K 4.2 12/14/2022   CL 101 12/14/2022   CREATININE 0.91 12/14/2022   BUN 13 12/14/2022   CO2 31 12/14/2022   TSH 4.79 12/14/2022   INR 1.2 04/23/2019   HGBA1C 5.9 12/14/2022    US RENAL Result Date: 10/18/2022 CLINICAL DATA:  Impaired renal function EXAM: RENAL / URINARY TRACT ULTRASOUND COMPLETE COMPARISON:  None Available. FINDINGS: Right Kidney: Length: 8.9 cm. Echogenicity within normal limits. Midpole cyst measures 6 mm. No suspicious mass or hydronephrosis visualized. Left Kidney: Length: 10.2  cm. Echogenicity within normal limits. Midpole cyst measures 14 mm. No suspicious mass or hydronephrosis visualized. Bladder: Appears normal for degree of bladder distention. IMPRESSION: Small bilateral renal cysts.  Otherwise unremarkable study. Electronically Signed   By: Layla Maw M.D.   On: 10/18/2022 08:52       Assessment & Plan:  Hypercholesterolemia Assessment & Plan: Continue crestor.  Low cholesterol diet and exercise.  Follow lipid panel and liver function tests.     Aortic atherosclerosis (HCC) Assessment & Plan: Continue crestor.    Mild intermittent asthma without complication Assessment & Plan: Breathing stable.  Follow.    Essential hypertension, benign Assessment & Plan:  On micardis 40mg  q day.  Follow pressures.  Follow metabolic panel.    Factor V Leiden [facility]) Assessment & Plan: On eliquis.  Discussed with hematology - ok to stop for colonoscopy.  Recommend minimizing length of time off eliquis    History of blood clots Assessment & Plan: Remains on eliquis.  Discussed risk of stopping for colonoscopy. Have d/w hematology. Recommend minimizing amount of time off eliquis.    Hyperglycemia Assessment & Plan: Low carb diet and exercise.  Follow met b and a1c.    Other orders -     Apixaban; Take 1 tablet (2.5 mg total) by mouth 2 (two) times daily.  Dispense: 180 tablet; Refill: 1 -     Citalopram Hydrobromide; Take 1 tablet (20 mg total) by mouth daily.  Dispense: 90 tablet; Refill: 1 -  Telmisartan; Take 1 tablet (40 mg total) by mouth daily.  Dispense: 90 tablet; Refill: 1     Dale Kingwood, MD

## 2023-02-25 ENCOUNTER — Encounter: Payer: Self-pay | Admitting: Internal Medicine

## 2023-02-25 NOTE — Assessment & Plan Note (Signed)
Breathing stable.  Follow.

## 2023-02-25 NOTE — Assessment & Plan Note (Signed)
Continue crestor.  Low cholesterol diet and exercise.  Follow lipid panel and liver function tests.

## 2023-02-25 NOTE — Assessment & Plan Note (Addendum)
On eliquis.  Discussed with hematology - ok to stop for colonoscopy.  Recommend minimizing length of time off eliquis

## 2023-02-25 NOTE — Assessment & Plan Note (Signed)
Low carb diet and exercise.  Follow met b and a1c.

## 2023-02-25 NOTE — Assessment & Plan Note (Signed)
Continue crestor

## 2023-02-25 NOTE — Assessment & Plan Note (Signed)
Remains on eliquis.  Discussed risk of stopping for colonoscopy. Have d/w hematology. Recommend minimizing amount of time off eliquis.

## 2023-02-25 NOTE — Assessment & Plan Note (Signed)
On micardis 40mg  q day.  Follow pressures.  Follow metabolic panel.

## 2023-03-01 ENCOUNTER — Telehealth: Payer: Self-pay

## 2023-03-01 NOTE — Telephone Encounter (Signed)
Copied from CRM [id number]. Topic: General - Other >> Mar 01, 2023  3:05 PM Alphonzo Lemmings O wrote: Reason for CRM: kianie from [facility] clinic  and we sent a clearance form for her eliquis and no one has responded . No one has called or faxed and we are needing to know how long she can stop it . Her procedure is feb 6th and we need to know tomorrow please respond or reach out call back number [phone] Back in November 22nd to doctor scott  gastro enterology. Called [facility] line and Ms. Shanda Bumps told me to twell them to refax information

## 2023-03-02 NOTE — Telephone Encounter (Signed)
Refaxed clearance to [facility].

## 2023-03-07 NOTE — H&P (Signed)
 Pre-Procedure H&P   [name] is a 78 y.o. female.  Gastroenterology Provider: Elspeth Ozell Jungling, DO  Referring Provider: Romero Antigua, PA PCP: Glendia Shad, MD  Date: 03/08/2023  HPI Tina Duarte is a 78 y.o. female who presents today for Esophagogastroduodenoscopy and Colonoscopy for dysphagia, personal history of colon polyps.  Patient notes dysphagia to pills and solids with sticking in the upper esophagus.  No odynophagia or issues with liquids.  She denies regurgitation and reflux.  No abdominal pain nausea or vomiting.  Occasional loose stools a few times a week  Hemoglobin 14.1 MCV 89.6 platelets 261,000 creatinine 0.9  Patient is on Eliquis  which has been held for this procedure (last dose Sunday evening)  Last colonoscopy in February 2012 with 2 adenomatous polyps, pandiverticulosis and external hemorrhoids   Past Medical History:  Diagnosis Date   Allergy    Asthma    Chicken pox    COPD (chronic obstructive pulmonary disease) (HCC)    History of blood clots    Hypercholesterolemia    Hypertension    Retroperitoneal hematoma 03/2019    Past Surgical History:  Procedure Laterality Date   blood clots  2008   TUBAL LIGATION      Family History Paternal cousin with colorectal cancer No other h/o GI disease or malignancy  Review of Systems  Constitutional:  Negative for activity change, appetite change, chills, diaphoresis, fatigue, fever and unexpected weight change.  HENT:  Positive for trouble swallowing. Negative for voice change.   Respiratory:  Negative for shortness of breath and wheezing.   Cardiovascular:  Negative for chest pain, palpitations and leg swelling.  Gastrointestinal:  Positive for diarrhea. Negative for abdominal distention, abdominal pain, anal bleeding, blood in stool, constipation, nausea, rectal pain and vomiting.  Musculoskeletal:  Negative for arthralgias and myalgias.  Skin:  Negative for color  change and pallor.  Neurological:  Negative for dizziness, syncope and weakness.  Psychiatric/Behavioral:  Negative for confusion.   All other systems reviewed and are negative.    Medications No current facility-administered medications on file prior to encounter.   Current Outpatient Medications on File Prior to Encounter  Medication Sig Dispense Refill   albuterol  (ACCUNEB ) 0.63 MG/3ML nebulizer solution Take 3 mLs (0.63 mg total) by nebulization every 6 (six) hours as needed for wheezing. 75 mL 3   albuterol  (VENTOLIN  HFA) 108 (90 Base) MCG/ACT inhaler Inhale 2 puffs into the lungs every 6 (six) hours as needed for wheezing or shortness of breath. 18 g 2   rosuvastatin  (CRESTOR ) 10 MG tablet Take 1 tablet (10 mg total) by mouth daily. 90 tablet 3    Pertinent medications related to GI and procedure were reviewed by me with the patient prior to the procedure   Current Facility-Administered Medications:    0.9 %  sodium chloride  infusion, , Intravenous, Continuous, Jungling Elspeth Ozell, DO  sodium chloride          No Known Allergies Allergies were reviewed by me prior to the procedure  Objective   Body mass index is 36.9 kg/m. Vitals:   03/08/23 0952  Pulse: 75  Resp: 18  Temp: (!) 96.4 F (35.8 C)  TempSrc: Temporal  SpO2: 99%  Weight: 97.5 kg  Height: 5' 4 (1.626 m)     Physical Exam Vitals and nursing note reviewed.  Constitutional:      General: She is not in acute distress.    Appearance: Normal appearance. She is not ill-appearing,  toxic-appearing or diaphoretic.  HENT:     Head: Normocephalic and atraumatic.     Nose: Nose normal.     Mouth/Throat:     Mouth: Mucous membranes are moist.     Pharynx: Oropharynx is clear.  Eyes:     General: No scleral icterus.    Extraocular Movements: Extraocular movements intact.  Cardiovascular:     Rate and Rhythm: Normal rate and regular rhythm.     Heart sounds: Normal heart sounds. No murmur heard.    No  friction rub. No gallop.  Pulmonary:     Effort: Pulmonary effort is normal. No respiratory distress.     Breath sounds: Normal breath sounds. No wheezing, rhonchi or rales.  Abdominal:     General: Bowel sounds are normal. There is no distension.     Palpations: Abdomen is soft.     Tenderness: There is no abdominal tenderness. There is no guarding or rebound.  Musculoskeletal:     Cervical back: Neck supple.     Comments: Ambulating with walker  Skin:    General: Skin is warm and dry.     Coloration: Skin is not jaundiced or pale.  Neurological:     General: No focal deficit present.     Mental Status: She is alert and oriented to person, place, and time. Mental status is at baseline.  Psychiatric:        Mood and Affect: Mood normal.        Behavior: Behavior normal.        Thought Content: Thought content normal.        Judgment: Judgment normal.      Assessment:  Tina Duarte is a 78 y.o. female  who presents today for Esophagogastroduodenoscopy and Colonoscopy for dysphagia, personal history of colon polyps.  Plan:  Esophagogastroduodenoscopy and Colonoscopy with possible intervention today  Esophagogastroduodenoscopy and Colonoscopy with possible biopsy, control of bleeding, polypectomy, and interventions as necessary has been discussed with the patient/patient representative. Informed consent was obtained from the patient/patient representative after explaining the indication, nature, and risks of the procedure including but not limited to death, bleeding, perforation, missed neoplasm/lesions, cardiorespiratory compromise, and reaction to medications. Opportunity for questions was given and appropriate answers were provided. Patient/patient representative has verbalized understanding is amenable to undergoing the procedure.   Elspeth Ozell Jungling, DO  [facility]  Portions of the record may have been created with voice recognition software.  Occasional wrong-word or 'sound-a-like' substitutions may have occurred due to the inherent limitations of voice recognition software.  Read the chart carefully and recognize, using context, where substitutions may have occurred.

## 2023-03-08 ENCOUNTER — Ambulatory Visit: Payer: Medicare HMO | Admitting: Certified Registered"

## 2023-03-08 ENCOUNTER — Encounter
Admission: RE | Disposition: A | Discharge status: Home / Self Care | Payer: Self-pay | Source: Ambulatory Visit | Attending: Gastroenterology

## 2023-03-08 ENCOUNTER — Ambulatory Visit
Admission: RE | Admit: 2023-03-08 | Discharge: 2023-03-08 | Disposition: A | Discharge status: Home / Self Care | Payer: Medicare HMO | Source: Ambulatory Visit | Attending: Gastroenterology | Admitting: Gastroenterology

## 2023-03-08 ENCOUNTER — Encounter: Payer: Self-pay | Admitting: Gastroenterology

## 2023-03-08 DIAGNOSIS — M1990 Unspecified osteoarthritis, unspecified site: Secondary | ICD-10-CM | POA: Diagnosis not present

## 2023-03-08 DIAGNOSIS — K2100 Gastro-esophageal reflux disease with esophagitis, without bleeding: Secondary | ICD-10-CM | POA: Insufficient documentation

## 2023-03-08 DIAGNOSIS — Z1211 Encounter for screening for malignant neoplasm of colon: Secondary | ICD-10-CM | POA: Diagnosis not present

## 2023-03-08 DIAGNOSIS — R1310 Dysphagia, unspecified: Secondary | ICD-10-CM | POA: Insufficient documentation

## 2023-03-08 DIAGNOSIS — Z86718 Personal history of other venous thrombosis and embolism: Secondary | ICD-10-CM | POA: Insufficient documentation

## 2023-03-08 DIAGNOSIS — Z79899 Other long term (current) drug therapy: Secondary | ICD-10-CM | POA: Diagnosis not present

## 2023-03-08 DIAGNOSIS — E669 Obesity, unspecified: Secondary | ICD-10-CM | POA: Insufficient documentation

## 2023-03-08 DIAGNOSIS — K2980 Duodenitis without bleeding: Secondary | ICD-10-CM | POA: Diagnosis not present

## 2023-03-08 DIAGNOSIS — K224 Dyskinesia of esophagus: Secondary | ICD-10-CM | POA: Diagnosis not present

## 2023-03-08 DIAGNOSIS — Z7901 Long term (current) use of anticoagulants: Secondary | ICD-10-CM | POA: Insufficient documentation

## 2023-03-08 DIAGNOSIS — K2289 Other specified disease of esophagus: Secondary | ICD-10-CM | POA: Diagnosis not present

## 2023-03-08 DIAGNOSIS — K5790 Diverticulosis of intestine, part unspecified, without perforation or abscess without bleeding: Secondary | ICD-10-CM | POA: Diagnosis not present

## 2023-03-08 DIAGNOSIS — N183 Chronic kidney disease, stage 3 unspecified: Secondary | ICD-10-CM | POA: Insufficient documentation

## 2023-03-08 DIAGNOSIS — K635 Polyp of colon: Secondary | ICD-10-CM | POA: Diagnosis not present

## 2023-03-08 DIAGNOSIS — K649 Unspecified hemorrhoids: Secondary | ICD-10-CM | POA: Diagnosis not present

## 2023-03-08 DIAGNOSIS — Z6836 Body mass index (BMI) 36.0-36.9, adult: Secondary | ICD-10-CM | POA: Diagnosis not present

## 2023-03-08 DIAGNOSIS — Z9981 Dependence on supplemental oxygen: Secondary | ICD-10-CM | POA: Diagnosis not present

## 2023-03-08 DIAGNOSIS — K641 Second degree hemorrhoids: Secondary | ICD-10-CM | POA: Insufficient documentation

## 2023-03-08 DIAGNOSIS — K5730 Diverticulosis of large intestine without perforation or abscess without bleeding: Secondary | ICD-10-CM | POA: Insufficient documentation

## 2023-03-08 DIAGNOSIS — J449 Chronic obstructive pulmonary disease, unspecified: Secondary | ICD-10-CM | POA: Diagnosis not present

## 2023-03-08 DIAGNOSIS — K644 Residual hemorrhoidal skin tags: Secondary | ICD-10-CM | POA: Diagnosis not present

## 2023-03-08 DIAGNOSIS — I129 Hypertensive chronic kidney disease with stage 1 through stage 4 chronic kidney disease, or unspecified chronic kidney disease: Secondary | ICD-10-CM | POA: Diagnosis not present

## 2023-03-08 DIAGNOSIS — Z7951 Long term (current) use of inhaled steroids: Secondary | ICD-10-CM | POA: Diagnosis not present

## 2023-03-08 DIAGNOSIS — Z860100 Personal history of colon polyps, unspecified: Secondary | ICD-10-CM | POA: Diagnosis not present

## 2023-03-08 DIAGNOSIS — K209 Esophagitis, unspecified without bleeding: Secondary | ICD-10-CM | POA: Diagnosis not present

## 2023-03-08 HISTORY — PX: COLONOSCOPY WITH PROPOFOL: SHX5780

## 2023-03-08 HISTORY — PX: MALONEY DILATION: SHX5535

## 2023-03-08 HISTORY — PX: BIOPSY: SHX5522

## 2023-03-08 HISTORY — PX: ESOPHAGOGASTRODUODENOSCOPY (EGD) WITH PROPOFOL: SHX5813

## 2023-03-08 HISTORY — PX: POLYPECTOMY: SHX5525

## 2023-03-08 SURGERY — COLONOSCOPY WITH PROPOFOL
Anesthesia: General

## 2023-03-08 MED ORDER — ONDANSETRON HCL 4 MG/2ML IJ SOLN
INTRAMUSCULAR | Status: DC | PRN
  Administered 2023-03-08: 4 mg via INTRAVENOUS

## 2023-03-08 MED ORDER — EPHEDRINE SULFATE-NACL 50-0.9 MG/10ML-% IV SOSY
PREFILLED_SYRINGE | INTRAVENOUS | Status: DC | PRN
  Administered 2023-03-08 (×2): 10 mg via INTRAVENOUS

## 2023-03-08 MED ORDER — PHENYLEPHRINE 80 MCG/ML (10ML) SYRINGE FOR IV PUSH (FOR BLOOD PRESSURE SUPPORT)
PREFILLED_SYRINGE | INTRAVENOUS | Status: DC | PRN
  Administered 2023-03-08 (×5): 80 ug via INTRAVENOUS

## 2023-03-08 MED ORDER — SODIUM CHLORIDE 0.9 % IV SOLN
INTRAVENOUS | Status: DC
Start: 2023-03-08 — End: 2023-03-08

## 2023-03-08 MED ORDER — PHENYLEPHRINE 80 MCG/ML (10ML) SYRINGE FOR IV PUSH (FOR BLOOD PRESSURE SUPPORT)
PREFILLED_SYRINGE | INTRAVENOUS | Status: AC
Start: 2023-03-08 — End: ?
  Filled 2023-03-08: qty 10

## 2023-03-08 MED ORDER — PROPOFOL 10 MG/ML IV BOLUS
INTRAVENOUS | Status: DC | PRN
  Administered 2023-03-08: 40 mg via INTRAVENOUS

## 2023-03-08 MED ORDER — ONDANSETRON HCL 4 MG/2ML IJ SOLN
INTRAMUSCULAR | Status: AC
  Filled 2023-03-08: qty 2

## 2023-03-08 MED ORDER — EPHEDRINE 5 MG/ML INJ
INTRAVENOUS | Status: AC
Start: 2023-03-08 — End: ?
  Filled 2023-03-08: qty 5

## 2023-03-08 MED ORDER — PROPOFOL 500 MG/50ML IV EMUL
INTRAVENOUS | Status: DC | PRN
  Administered 2023-03-08: 135 ug/kg/min via INTRAVENOUS

## 2023-03-08 NOTE — Anesthesia Procedure Notes (Signed)
 Procedure Name: MAC Date/Time: 03/08/2023 10:21 AM  Performed by: Ulanda Gambles, CRNAPre-anesthesia Checklist: Patient identified, Suction available, Emergency Drugs available and Patient being monitored Oxygen  Delivery Method: Simple face mask

## 2023-03-08 NOTE — Anesthesia Preprocedure Evaluation (Addendum)
 Anesthesia Evaluation  Patient identified by MRN, date of birth, ID band Patient awake    Reviewed: Allergy & Precautions, NPO status , Patient's Chart, lab work & pertinent test results  History of Anesthesia Complications Negative for: history of anesthetic complications  Airway Mallampati: I   Neck ROM: Full    Dental  (+) Missing, Chipped   Pulmonary asthma , COPD (on 1L home O2 at night)   Pulmonary exam normal breath sounds clear to auscultation       Cardiovascular hypertension, Normal cardiovascular exam Rhythm:Regular Rate:Normal  Hx DVT on Eliquis   ECG 06/02/21:  Sinus  Rhythm  Low voltage in precordial leads.   -  Negative precordial T-waves.    Neuro/Psych negative neurological ROS     GI/Hepatic negative GI ROS,,,  Endo/Other  Obesity   Renal/GU Renal disease (stage III CKD)     Musculoskeletal  (+) Arthritis ,    Abdominal   Peds  Hematology negative hematology ROS (+)   Anesthesia Other Findings   Reproductive/Obstetrics                             Anesthesia Physical Anesthesia Plan  ASA: 3  Anesthesia Plan: General   Post-op Pain Management:    Induction: Intravenous  PONV Risk Score and Plan: 3 and Propofol  infusion, TIVA and Treatment may vary due to age or medical condition  Airway Management Planned: Natural Airway  Additional Equipment:   Intra-op Plan:   Post-operative Plan:   Informed Consent: I have reviewed the patients History and Physical, chart, labs and discussed the procedure including the risks, benefits and alternatives for the proposed anesthesia with the patient or authorized representative who has indicated his/her understanding and acceptance.       Plan Discussed with: CRNA  Anesthesia Plan Comments: (LMA/GETA backup discussed.  Patient consented for risks of anesthesia including but not limited to:  - adverse reactions to  medications - damage to eyes, teeth, lips or other oral mucosa - nerve damage due to positioning  - sore throat or hoarseness - damage to heart, brain, nerves, lungs, other parts of body or loss of life  Informed patient about role of CRNA in peri- and intra-operative care.  Patient voiced understanding.)        Anesthesia Quick Evaluation

## 2023-03-08 NOTE — Op Note (Signed)
 [facility] Gastroenterology Patient Name: Tina Duarte Procedure Date: 03/08/2023 9:44 AM MRN: [id number] Account #: [id number] Date of Birth: 10/02/1945 Admit Type: Outpatient Age: 78 Room: [facility] [place] ENDO ROOM 1 Gender: Female Note Status: Finalized Instrument Name: Peds Colonoscope [id number] Procedure:             Colonoscopy Indications:           High risk colon cancer surveillance: Personal history                         of colonic polyps Providers:             Elspeth Ozell Jungling DO, DO Referring MD:          Allena Hamilton, MD (Referring MD) Medicines:             Monitored Anesthesia Care Complications:         No immediate complications. Estimated blood loss:                         Minimal. Procedure:             Pre-Anesthesia Assessment:                        - Prior to the procedure, a History and Physical was                         performed, and patient medications and allergies were                         reviewed. The patient is competent. The risks and                         benefits of the procedure and the sedation options and                         risks were discussed with the patient. All questions                         were answered and informed consent was obtained.                         Patient identification and proposed procedure were                         verified by the physician, the nurse, the anesthetist                         and the technician in the endoscopy suite. Mental                         Status Examination: alert and oriented. Airway                         Examination: normal oropharyngeal airway and neck                         mobility. Respiratory Examination: clear to  auscultation. CV Examination: RRR, no murmurs, no S3                         or S4. Prophylactic Antibiotics: The patient does not                         require prophylactic antibiotics. Prior                          Anticoagulants: The patient has taken Eliquis                          (apixaban ), last dose was 4 days prior to procedure.                         ASA Grade Assessment: II - A patient with mild                         systemic disease. After reviewing the risks and                         benefits, the patient was deemed in satisfactory                         condition to undergo the procedure. The anesthesia                         plan was to use monitored anesthesia care (MAC).                         Immediately prior to administration of medications,                         the patient was re-assessed for adequacy to receive                         sedatives. The heart rate, respiratory rate, oxygen                          saturations, blood pressure, adequacy of pulmonary                         ventilation, and response to care were monitored                         throughout the procedure. The physical status of the                         patient was re-assessed after the procedure.                        After obtaining informed consent, the colonoscope was                         passed under direct vision. Throughout the procedure,                         the patient's blood pressure, pulse, and oxygen   saturations were monitored continuously. The                         Colonoscope was introduced through the anus and                         advanced to the the cecum, identified by appendiceal                         orifice and ileocecal valve. The colonoscopy was                         somewhat difficult due to multiple diverticula in the                         colon, a redundant colon, significant looping and a                         tortuous colon. Successful completion of the procedure                         was aided by straightening and shortening the scope to                         obtain bowel loop reduction, using scope torsion,                          applying abdominal pressure and lavage. The patient                         tolerated the procedure well. The quality of the bowel                         preparation was evaluated using the BBPS [facility] Bowel                         Preparation Scale) with scores of: Right Colon = 3                         (entire mucosa seen well with no residual staining,                         small fragments of stool or opaque liquid), Transverse                         Colon = 3 (entire mucosa seen well with no residual                         staining, small fragments of stool or opaque liquid)                         and Left Colon = 2 (minor amount of residual staining,                         small fragments of stool and/or opaque liquid, but  mucosa seen well). The total BBPS score equals 8. The                         quality of the bowel preparation was excellent. The                         ileocecal valve, appendiceal orifice, and rectum were                         photographed. Findings:      Hemorrhoids were found on perianal exam.      The digital rectal exam was normal. Pertinent negatives include normal       sphincter tone.      A 1 to 2 mm polyp was found in the sigmoid colon. The polyp was sessile.       The polyp was removed with a jumbo cold forceps. Resection and retrieval       were complete. Estimated blood loss was minimal.      Multiple small-mouthed diverticula were found in the entire colon. Most       prevalent in the sigmoid colon. Estimated blood loss: none.      Non-bleeding internal hemorrhoids were found during retroflexion and       during perianal exam. The hemorrhoids were Grade II (internal       hemorrhoids that prolapse but reduce spontaneously). Estimated blood       loss: none.      Normal mucosa was found in the entire colon. Biopsies for histology were       taken with a cold forceps from the right colon and left colon  for       evaluation of microscopic colitis. Estimated blood loss was minimal.      The exam was otherwise without abnormality on direct and retroflexion       views. Impression:            - Hemorrhoids found on perianal exam.                        - One 1 to 2 mm polyp in the sigmoid colon, removed                         with a jumbo cold forceps. Resected and retrieved.                        - Diverticulosis in the entire examined colon.                        - Non-bleeding internal hemorrhoids.                        - Normal mucosa in the entire examined colon. Biopsied.                        - The examination was otherwise normal on direct and                         retroflexion views. Recommendation:        - Patient has a contact number available for  emergencies. The signs and symptoms of potential                         delayed complications were discussed with the patient.                         Return to normal activities tomorrow. Written                         discharge instructions were provided to the patient.                        - Discharge patient to home.                        - Resume previous diet.                        - Continue present medications.                        - Await pathology results.                        - Repeat colonoscopy for surveillance based on                         pathology results.                        - Return to GI office as previously scheduled.                        - Resume Eliquis  (apixaban ) at prior dose in 2 days.                         Refer to managing physician for further adjustment of                         therapy.                        - The findings and recommendations were discussed with                         the patient. Procedure Code(s):     --- Professional ---                        [phone], Colonoscopy, flexible; with biopsy, single or                          multiple Diagnosis Code(s):     --- Professional ---                        Z86.010, Personal history of colonic polyps                        D12.5, Benign neoplasm of sigmoid colon                        K64.1, Second degree  hemorrhoids                        K57.30, Diverticulosis of large intestine without                         perforation or abscess without bleeding CPT copyright 2022 American [employer]. All rights reserved. The codes documented in this report are preliminary and upon coder review may  be revised to meet current compliance requirements. Attending Participation:      I personally performed the entire procedure. Elspeth Jungling, DO Elspeth Ozell Jungling DO, DO 03/08/2023 11:00:59 AM This report has been signed electronically. Number of Addenda: 0 Note Initiated On: 03/08/2023 9:44 AM Scope Withdrawal Time: 0 hours 7 minutes 57 seconds  Total Procedure Duration: 0 hours 16 minutes 9 seconds  Estimated Blood Loss:  Estimated blood loss was minimal.      [facility]

## 2023-03-08 NOTE — Transfer of Care (Signed)
 Immediate Anesthesia Transfer of Care Note  Patient: Tina Duarte  Procedure(s) Performed: COLONOSCOPY WITH PROPOFOL  ESOPHAGOGASTRODUODENOSCOPY (EGD) WITH PROPOFOL  BIOPSY MALONEY DILATION POLYPECTOMY  Patient Location: PACU  Anesthesia Type:General  Level of Consciousness: drowsy  Airway & Oxygen  Therapy: Patient Spontanous Breathing and Patient connected to face mask oxygen   Post-op Assessment: Report given to RN, Post -op Vital signs reviewed and stable, and Patient moving all extremities X 4  Post vital signs: Reviewed and stable  Last Vitals:  Vitals Value Taken Time  BP 134/50   Temp    Pulse 80   Resp 12   SpO2 98     Last Pain:  Vitals:   03/08/23 0952  TempSrc: Temporal  PainSc: 0-No pain         Complications: No notable events documented.

## 2023-03-08 NOTE — Interval H&P Note (Signed)
 History and Physical Interval Note: Preprocedure H&P from 03/08/23  was reviewed and there was no interval change after seeing and examining the patient.  Written consent was obtained from the patient after discussion of risks, benefits, and alternatives. Patient has consented to proceed with Esophagogastroduodenoscopy and Colonoscopy with possible intervention   03/08/2023 10:07 AM  Tina Duarte  has presented today for surgery, with the diagnosis of Z86.0100 (ICD-10-CM) - History of colon polyps R13.10 (ICD-10-CM) - Dysphagia, unspecified type.  The various methods of treatment have been discussed with the patient and family. After consideration of risks, benefits and other options for treatment, the patient has consented to  Procedure(s): COLONOSCOPY WITH PROPOFOL  (N/A) ESOPHAGOGASTRODUODENOSCOPY (EGD) WITH PROPOFOL  (N/A) as a surgical intervention.  The patient's history has been reviewed, patient examined, no change in status, stable for surgery.  I have reviewed the patient's chart and labs.  Questions were answered to the patient's satisfaction.     Elspeth Ozell Jungling

## 2023-03-08 NOTE — Anesthesia Postprocedure Evaluation (Signed)
 Anesthesia Post Note  Patient: Tina Duarte  Procedure(s) Performed: COLONOSCOPY WITH PROPOFOL  ESOPHAGOGASTRODUODENOSCOPY (EGD) WITH PROPOFOL  BIOPSY MALONEY DILATION POLYPECTOMY  Patient location during evaluation: PACU Anesthesia Type: General Level of consciousness: awake and alert, oriented and patient cooperative Pain management: pain level controlled Vital Signs Assessment: post-procedure vital signs reviewed and stable Respiratory status: spontaneous breathing, nonlabored ventilation and respiratory function stable Cardiovascular status: blood pressure returned to baseline and stable Postop Assessment: adequate PO intake Anesthetic complications: no   No notable events documented.   Last Vitals:  Vitals:   03/08/23 1107 03/08/23 1117  BP: (!) 109/49 114/69  Pulse: 72 72  Resp: 19   Temp:    SpO2: 99% 99%    Last Pain:  Vitals:   03/08/23 1117  TempSrc:   PainSc: 0-No pain                 Alfonso Ruths

## 2023-03-08 NOTE — Op Note (Signed)
 [facility] Gastroenterology Patient Name: Tina Duarte Procedure Date: 03/08/2023 9:44 AM MRN: [id number] Account #: [id number] Date of Birth: 1945-02-05 Admit Type: Outpatient Age: 78 Room: [facility] ENDO ROOM 1 Gender: Female Note Status: Finalized Instrument Name: Upper Endoscope [id number] Procedure:             Upper GI endoscopy Indications:           Dysphagia Providers:             Elspeth Ozell Jungling DO, DO Referring MD:          Allena Hamilton, MD (Referring MD) Medicines:             Monitored Anesthesia Care Complications:         No immediate complications. Estimated blood loss:                         Minimal. Procedure:             Pre-Anesthesia Assessment:                        - Prior to the procedure, a History and Physical was                         performed, and patient medications and allergies were                         reviewed. The patient is competent. The risks and                         benefits of the procedure and the sedation options and                         risks were discussed with the patient. All questions                         were answered and informed consent was obtained.                         Patient identification and proposed procedure were                         verified by the physician, the nurse, the anesthetist                         and the technician in the endoscopy suite. Mental                         Status Examination: alert and oriented. Airway                         Examination: normal oropharyngeal airway and neck                         mobility. Respiratory Examination: clear to                         auscultation. CV Examination: RRR, no murmurs, no S3  or S4. Prophylactic Antibiotics: The patient does not                         require prophylactic antibiotics. Prior                         Anticoagulants: The patient has taken Eliquis                          (apixaban ),  last dose was 4 days prior to procedure.                         ASA Grade Assessment: II - A patient with mild                         systemic disease. After reviewing the risks and                         benefits, the patient was deemed in satisfactory                         condition to undergo the procedure. The anesthesia                         plan was to use monitored anesthesia care (MAC).                         Immediately prior to administration of medications,                         the patient was re-assessed for adequacy to receive                         sedatives. The heart rate, respiratory rate, oxygen                          saturations, blood pressure, adequacy of pulmonary                         ventilation, and response to care were monitored                         throughout the procedure. The physical status of the                         patient was re-assessed after the procedure.                        After obtaining informed consent, the endoscope was                         passed under direct vision. Throughout the procedure,                         the patient's blood pressure, pulse, and oxygen                          saturations were monitored continuously. The Endoscope  was introduced through the mouth, and advanced to the                         third part of duodenum. The upper GI endoscopy was                         accomplished without difficulty. The patient tolerated                         the procedure well. Findings:      The duodenal bulb, first portion of the duodenum, second portion of the       duodenum and third portion of the duodenum were normal. Biopsies for       histology were taken with a cold forceps for evaluation of celiac       disease. Estimated blood loss was minimal.      The entire examined stomach was normal. Estimated blood loss: none.      LA Grade A (one or more mucosal breaks less than 5 mm,  not extending       between tops of 2 mucosal folds) esophagitis with no bleeding was found.       Estimated blood loss: none.      The Z-line was irregular. Tongue of salmon colored mucosa ~1 cm in       length Biopsies were taken with a cold forceps for histology. Estimated       blood loss was minimal.      Esophagogastric landmarks were identified: the gastroesophageal junction       was found at 36 cm from the incisors.      Abnormal motility was noted in the esophagus. The cricopharyngeus was       normal. There is spasticity of the esophageal body. The distal       esophagus/lower esophageal sphincter is open. The scope was withdrawn.       Dilation was performed with a Maloney dilator with no resistance at 52       Fr. The dilation site was examined following endoscope reinsertion and       showed mild mucosal disruption. Noted just proximal to the UES.       Estimated blood loss was minimal.      The exam of the esophagus was otherwise normal. Impression:            - Normal duodenal bulb, first portion of the duodenum,                         second portion of the duodenum and third portion of                         the duodenum. Biopsied.                        - Normal stomach.                        - LA Grade A reflux esophagitis with no bleeding.                        - Z-line irregular. Biopsied.                        -  Esophagogastric landmarks identified.                        - Abnormal esophageal motility, suspicious for                         presbyesophagus. Dilated. Recommendation:        - Patient has a contact number available for                         emergencies. The signs and symptoms of potential                         delayed complications were discussed with the patient.                         Return to normal activities tomorrow. Written                         discharge instructions were provided to the patient.                        -  Discharge patient to home.                        - Resume previous diet.                        - Continue present medications.                        - Await pathology results.                        - Return to GI clinic as previously scheduled.                        - proceed with colonoscopy. see report for further                         recommendations including reinitation timing of eliquis                         - The findings and recommendations were discussed with                         the patient. Procedure Code(s):     --- Professional ---                        [phone], Esophagogastroduodenoscopy, flexible,                         transoral; with biopsy, single or multiple                        [id number], Dilation of esophagus, by unguided sound or                         bougie, single or multiple passes Diagnosis Code(s):     --- Professional ---  K21.00, Gastro-esophageal reflux disease with                         esophagitis, without bleeding                        K22.89, Other specified disease of esophagus                        K22.4, Dyskinesia of esophagus                        R13.10, Dysphagia, unspecified CPT copyright 2022 American Medical Association. All rights reserved. The codes documented in this report are preliminary and upon coder review may  be revised to meet current compliance requirements. Attending Participation:      I personally performed the entire procedure. Elspeth Jungling, DO Elspeth Ozell Jungling DO, DO 03/08/2023 10:36:06 AM This report has been signed electronically. Number of Addenda: 0 Note Initiated On: 03/08/2023 9:44 AM Estimated Blood Loss:  Estimated blood loss was minimal.      [facility]

## 2023-03-09 ENCOUNTER — Encounter: Payer: Self-pay | Admitting: Gastroenterology

## 2023-03-09 LAB — SURGICAL PATHOLOGY

## 2023-04-10 ENCOUNTER — Ambulatory Visit: Attending: Anesthesiology | Admitting: Anesthesiology

## 2023-04-10 ENCOUNTER — Encounter: Payer: Self-pay | Admitting: Anesthesiology

## 2023-04-10 VITALS — BP 133/58 | HR 75 | Temp 97.4°F | Resp 16 | Ht 64.0 in | Wt 211.0 lb

## 2023-04-10 DIAGNOSIS — F1190 Opioid use, unspecified, uncomplicated: Secondary | ICD-10-CM | POA: Insufficient documentation

## 2023-04-10 DIAGNOSIS — M1711 Unilateral primary osteoarthritis, right knee: Secondary | ICD-10-CM | POA: Diagnosis not present

## 2023-04-10 DIAGNOSIS — M47816 Spondylosis without myelopathy or radiculopathy, lumbar region: Secondary | ICD-10-CM | POA: Diagnosis not present

## 2023-04-10 DIAGNOSIS — M25552 Pain in left hip: Secondary | ICD-10-CM | POA: Insufficient documentation

## 2023-04-10 DIAGNOSIS — M5136 Other intervertebral disc degeneration, lumbar region with discogenic back pain only: Secondary | ICD-10-CM | POA: Diagnosis not present

## 2023-04-10 DIAGNOSIS — G894 Chronic pain syndrome: Secondary | ICD-10-CM | POA: Diagnosis not present

## 2023-04-10 DIAGNOSIS — M48062 Spinal stenosis, lumbar region with neurogenic claudication: Secondary | ICD-10-CM | POA: Insufficient documentation

## 2023-04-10 DIAGNOSIS — M25551 Pain in right hip: Secondary | ICD-10-CM | POA: Insufficient documentation

## 2023-04-10 MED ORDER — TRAMADOL HCL 50 MG PO TABS: 50.0000 mg | ORAL_TABLET | Freq: Four times a day (QID) | ORAL | 2 refills | Status: DC

## 2023-04-10 NOTE — Progress Notes (Unsigned)
 Nursing Pain Medication Assessment:  Safety precautions to be maintained throughout the outpatient stay will include: orient to surroundings, keep bed in low position, maintain call bell within reach at all times, provide assistance with transfer out of bed and ambulation.  Medication Inspection Compliance: Tina Duarte did not comply with our request to bring her pills to be counted. She was reminded that bringing the medication bottles, even when empty, is a requirement.  Medication: None brought in. Pill/Patch Count: None available to be counted. Bottle Appearance: No container available. Did not bring bottle(s) to appointment. Filled Date: N/A Last Medication intake:  Today Instructed to bring pills  for count

## 2023-04-11 NOTE — Progress Notes (Signed)
 Subjective:  [name], female    DOB: May 03, 1945  Age: 78 y.o. MRN: [id number]  CC: Back Pain   Procedure: None  HPI Tearah Saulsbury Seeley presents for reevaluation.  She has been continuing to have intermittent chronic low back pain of the same quality characteristic and distribution as before.  She occasionally gets some spasming into the hips and the lower buttock region but generally this stays in the mid lumbar region on both sides.  Massage helps and certain stretches do help.  She has been taking tramadol 3 times a day chronically for pain management and this has been beneficial with no side effects reported.  She generally gets about 50 to 70% relief.  This goes on for 4 hours or so before she gets recurrence of the pain but she recently has been having otherwise no change in lower extremity strength function bowel or bladder function noted at this time.  Outpatient Medications Prior to Visit  Medication Sig Dispense Refill   albuterol (ACCUNEB) 0.63 MG/3ML nebulizer solution Take 3 mLs (0.63 mg total) by nebulization every 6 (six) hours as needed for wheezing. 75 mL 3   albuterol (VENTOLIN HFA) 108 (90 Base) MCG/ACT inhaler Inhale 2 puffs into the lungs every 6 (six) hours as needed for wheezing or shortness of breath. 18 g 2   apixaban (ELIQUIS) 2.5 MG TABS tablet Take 1 tablet (2.5 mg total) by mouth 2 (two) times daily. 180 tablet 1   citalopram (CELEXA) 20 MG tablet Take 1 tablet (20 mg total) by mouth daily. 90 tablet 1   rosuvastatin (CRESTOR) 10 MG tablet Take 1 tablet (10 mg total) by mouth daily. 90 tablet 3   telmisartan (MICARDIS) 40 MG tablet Take 1 tablet (40 mg total) by mouth daily. 90 tablet 1   traMADol (ULTRAM) 50 MG tablet Take 1 tablet (50 mg total) by mouth 3 (three) times daily. 90 tablet 2   No facility-administered medications prior to visit.    Review of Systems CNS: No confusion or sedation Cardiac: No angina or palpitations GI: No abdominal pain  or constipation Constitutional: No nausea vomiting fevers or chills  Objective:  BP (!) 133/58   Pulse 75   Temp (!) 97.4 F (36.3 C)   Resp 16   Ht 5\' 4"  (1.626 m)   Wt 211 lb (95.7 kg)   LMP 04/29/1997   SpO2 94%   BMI 36.22 kg/m    BP Readings from Last 3 Encounters:  04/10/23 (!) 133/58  03/08/23 114/69  02/20/23 120/70     Wt Readings from Last 3 Encounters:  04/10/23 211 lb (95.7 kg)  03/08/23 215 lb (97.5 kg)  02/20/23 215 lb 3.2 oz (97.6 kg)     Physical Exam Pt is alert and oriented PERRL EOMI HEART IS RRR no murmur or rub LCTA no wheezing or rales MUSCULOSKELETAL reveals some paraspinous muscle tenderness in the lumbar region.  No overt trigger points are noted.  She goes from seated to standing with mild difficulty but otherwise does okay.  Muscle tone and bulk is at baseline from previous evaluation.  Labs  Lab Results  Component Value Date   HGBA1C 5.9 12/14/2022   HGBA1C 5.9 08/17/2022   HGBA1C 5.9 04/13/2022   Lab Results  Component Value Date   LDLCALC 66 12/14/2022   CREATININE 0.91 12/14/2022    -------------------------------------------------------------------------------------------------------------------- Lab Results  Component Value Date   WBC 8.2 08/17/2022   HGB 14.1 08/17/2022  HCT 43.4 08/17/2022   PLT 261.0 08/17/2022   GLUCOSE 92 12/14/2022   CHOL 135 12/14/2022   TRIG 118.0 12/14/2022   HDL 45.40 12/14/2022   LDLDIRECT 81.0 04/13/2022   LDLCALC 66 12/14/2022   ALT 9 01/22/2023   AST 17 01/22/2023   NA 140 12/14/2022   K 4.2 12/14/2022   CL 101 12/14/2022   CREATININE 0.91 12/14/2022   BUN 13 12/14/2022   CO2 31 12/14/2022   TSH 4.79 12/14/2022   INR 1.2 04/23/2019   HGBA1C 5.9 12/14/2022    --------------------------------------------------------------------------------------------------------------------- No results found.   Assessment & Plan:   Aven was seen today for back pain.  Diagnoses and all  orders for this visit:  Primary osteoarthritis of right knee  Spinal stenosis of lumbar region with neurogenic claudication  Hip pain, bilateral  Facet arthritis of lumbar region  Degeneration of intervertebral disc of lumbar region with discogenic back pain  Chronic pain syndrome -     ToxASSURE Select 13 (MW), Urine  Chronic, continuous use of opioids -     ToxASSURE Select 13 (MW), Urine  Other orders -     traMADol (ULTRAM) 50 MG tablet; Take 1 tablet (50 mg total) by mouth 4 (four) times daily.        ----------------------------------------------------------------------------------------------------------------------  Problem List Items Addressed This Visit       Unprioritized   Chronic pain syndrome   Relevant Medications   traMADol (ULTRAM) 50 MG tablet   Other Relevant Orders   ToxASSURE Select 13 (MW), Urine   Chronic, continuous use of opioids   Relevant Orders   ToxASSURE Select 13 (MW), Urine   DDD (degenerative disc disease), lumbar   Relevant Medications   traMADol (ULTRAM) 50 MG tablet   Facet arthritis of lumbar region   Relevant Medications   traMADol (ULTRAM) 50 MG tablet   Spinal stenosis of lumbar region   Other Visit Diagnoses       Primary osteoarthritis of right knee    -  Primary   Relevant Medications   traMADol (ULTRAM) 50 MG tablet     Hip pain, bilateral             ----------------------------------------------------------------------------------------------------------------------  1. Primary osteoarthritis of right knee (Primary) Continue follow-up with her primary care physicians  2. Spinal stenosis of lumbar region with neurogenic claudication This seems to be stable but she is having more frequent breakthrough pain.  I think it is reasonable to increase her tramadol dose to 4 times a day and we will do this accordingly.  Continue with stretching exercises as reviewed today.  3. Hip pain, bilateral As above  4.  Facet arthritis of lumbar region As above and she may be a candidate for a TENS unit.  She can get this off of [employer] as discussed with her today.  5. Degeneration of intervertebral disc of lumbar region with discogenic back pain As above  6. Chronic pain syndrome I have reviewed the [facility] practitioner database information is appropriate for refill. - ToxASSURE Select 13 (MW), Urine  7. Chronic, continuous use of opioids As above - ToxASSURE Select 13 (MW), Urine    ----------------------------------------------------------------------------------------------------------------------  I have changed Alcide Clever. Petrilla's traMADol. I am also having her maintain her albuterol, albuterol, rosuvastatin, apixaban, citalopram, and telmisartan.   Meds ordered this encounter  Medications   traMADol (ULTRAM) 50 MG tablet    Sig: Take 1 tablet (50 mg total) by mouth 4 (four) times daily.  Dispense:  120 tablet    Refill:  2   Patient's Medications  New Prescriptions   No medications on file  Previous Medications   ALBUTEROL (ACCUNEB) 0.63 MG/3ML NEBULIZER SOLUTION    Take 3 mLs (0.63 mg total) by nebulization every 6 (six) hours as needed for wheezing.   ALBUTEROL (VENTOLIN HFA) 108 (90 BASE) MCG/ACT INHALER    Inhale 2 puffs into the lungs every 6 (six) hours as needed for wheezing or shortness of breath.   APIXABAN (ELIQUIS) 2.5 MG TABS TABLET    Take 1 tablet (2.5 mg total) by mouth 2 (two) times daily.   CITALOPRAM (CELEXA) 20 MG TABLET    Take 1 tablet (20 mg total) by mouth daily.   ROSUVASTATIN (CRESTOR) 10 MG TABLET    Take 1 tablet (10 mg total) by mouth daily.   TELMISARTAN (MICARDIS) 40 MG TABLET    Take 1 tablet (40 mg total) by mouth daily.  Modified Medications   Modified Medication Previous Medication   TRAMADOL (ULTRAM) 50 MG TABLET traMADol (ULTRAM) 50 MG tablet      Take 1 tablet (50 mg total) by mouth 4 (four) times daily.    Take 1 tablet (50 mg total) by mouth  3 (three) times daily.  Discontinued Medications   No medications on file   ----------------------------------------------------------------------------------------------------------------------  Follow-up: Return in about 2 months (around 06/10/2023) for evaluation, med refill.  Continue follow-up with her primary care physicians for baseline medical care and continue current medication therapy.  Yevette Edwards, MD

## 2023-04-13 LAB — TOXASSURE SELECT 13 (MW), URINE

## 2023-04-16 ENCOUNTER — Ambulatory Visit (INDEPENDENT_AMBULATORY_CARE_PROVIDER_SITE_OTHER): Payer: Medicare HMO | Admitting: Internal Medicine

## 2023-04-16 ENCOUNTER — Encounter: Payer: Self-pay | Admitting: Internal Medicine

## 2023-04-16 VITALS — BP 122/74 | HR 67 | Temp 97.5°F | Ht 64.0 in | Wt 210.2 lb

## 2023-04-16 DIAGNOSIS — D6851 Activated protein C resistance: Secondary | ICD-10-CM

## 2023-04-16 DIAGNOSIS — Z0000 Encounter for general adult medical examination without abnormal findings: Secondary | ICD-10-CM

## 2023-04-16 DIAGNOSIS — I10 Essential (primary) hypertension: Secondary | ICD-10-CM | POA: Diagnosis not present

## 2023-04-16 DIAGNOSIS — E7800 Pure hypercholesterolemia, unspecified: Secondary | ICD-10-CM

## 2023-04-16 DIAGNOSIS — J9611 Chronic respiratory failure with hypoxia: Secondary | ICD-10-CM

## 2023-04-16 DIAGNOSIS — I700 Atherosclerosis of aorta: Secondary | ICD-10-CM

## 2023-04-16 DIAGNOSIS — M48062 Spinal stenosis, lumbar region with neurogenic claudication: Secondary | ICD-10-CM | POA: Diagnosis not present

## 2023-04-16 DIAGNOSIS — R739 Hyperglycemia, unspecified: Secondary | ICD-10-CM | POA: Diagnosis not present

## 2023-04-16 DIAGNOSIS — D6859 Other primary thrombophilia: Secondary | ICD-10-CM

## 2023-04-16 DIAGNOSIS — F439 Reaction to severe stress, unspecified: Secondary | ICD-10-CM | POA: Diagnosis not present

## 2023-04-16 LAB — HEMOGLOBIN A1C: Hgb A1c MFr Bld: 6 % (ref 4.6–6.5)

## 2023-04-16 LAB — CBC WITH DIFFERENTIAL/PLATELET
Basophils Absolute: 0.1 10*3/uL (ref 0.0–0.1)
Basophils Relative: 1 % (ref 0.0–3.0)
Eosinophils Absolute: 0.3 10*3/uL (ref 0.0–0.7)
Eosinophils Relative: 4.4 % (ref 0.0–5.0)
HCT: 42.7 % (ref 36.0–46.0)
Hemoglobin: 14.2 g/dL (ref 12.0–15.0)
Lymphocytes Relative: 42.1 % (ref 12.0–46.0)
Lymphs Abs: 3 10*3/uL (ref 0.7–4.0)
MCHC: 33.3 g/dL (ref 30.0–36.0)
MCV: 88.6 fl (ref 78.0–100.0)
Monocytes Absolute: 0.4 10*3/uL (ref 0.1–1.0)
Monocytes Relative: 6 % (ref 3.0–12.0)
Neutro Abs: 3.3 10*3/uL (ref 1.4–7.7)
Neutrophils Relative %: 46.5 % (ref 43.0–77.0)
Platelets: 245 10*3/uL (ref 150.0–400.0)
RBC: 4.82 Mil/uL (ref 3.87–5.11)
RDW: 13.4 % (ref 11.5–15.5)
WBC: 7.2 10*3/uL (ref 4.0–10.5)

## 2023-04-16 LAB — TSH: TSH: 3.48 u[IU]/mL (ref 0.35–5.50)

## 2023-04-16 LAB — BASIC METABOLIC PANEL
BUN: 13 mg/dL (ref 6–23)
CO2: 26 meq/L (ref 19–32)
Calcium: 9.3 mg/dL (ref 8.4–10.5)
Chloride: 104 meq/L (ref 96–112)
Creatinine, Ser: 1.18 mg/dL (ref 0.40–1.20)
GFR: 43.93 mL/min — ABNORMAL LOW (ref >60.00)
Glucose, Bld: 100 mg/dL — ABNORMAL HIGH (ref 70–99)
Potassium: 4.3 meq/L (ref 3.5–5.1)
Sodium: 139 meq/L (ref 135–145)

## 2023-04-16 LAB — HEPATIC FUNCTION PANEL
ALT: 10 U/L (ref 0–35)
AST: 16 U/L (ref 0–37)
Albumin: 4.3 g/dL (ref 3.5–5.2)
Alkaline Phosphatase: 70 U/L (ref 39–117)
Bilirubin, Direct: 0.2 mg/dL (ref 0.0–0.3)
Total Bilirubin: 0.9 mg/dL (ref 0.2–1.2)
Total Protein: 7.1 g/dL (ref 6.0–8.3)

## 2023-04-16 LAB — LIPID PANEL
Cholesterol: 141 mg/dL (ref 0–200)
HDL: 42.2 mg/dL (ref >39.00)
LDL Cholesterol: 76 mg/dL (ref 0–99)
NonHDL: 98.36
Total CHOL/HDL Ratio: 3
Triglycerides: 113 mg/dL (ref 0.0–149.0)
VLDL: 22.6 mg/dL (ref 0.0–40.0)

## 2023-04-16 MED ORDER — TELMISARTAN 40 MG PO TABS
40.0000 mg | ORAL_TABLET | Freq: Every day | ORAL | 1 refills | Status: AC
Start: 2023-04-16 — End: ?

## 2023-04-16 MED ORDER — APIXABAN 2.5 MG PO TABS: 2.5000 mg | ORAL_TABLET | Freq: Two times a day (BID) | ORAL | 1 refills | Status: AC

## 2023-04-16 MED ORDER — CITALOPRAM HYDROBROMIDE 20 MG PO TABS: 20.0000 mg | ORAL_TABLET | Freq: Every day | ORAL | 1 refills | Status: AC

## 2023-04-16 MED ORDER — ROSUVASTATIN CALCIUM 10 MG PO TABS: 10.0000 mg | ORAL_TABLET | Freq: Every day | ORAL | 3 refills | Status: AC

## 2023-04-16 NOTE — Progress Notes (Signed)
 Subjective:    [name], female    DOB: 1945/10/11, 78 y.o.   MRN: [id number]  Patient here for  Chief Complaint  Patient presents with   Annual Exam    HPI Here for a physical exam. Followed at pain clinic - chronic low back pain - taking tramadol. S/p colonoscopy 03/08/23 - hemorrhoids, one 1-53mm polyp in the sigmoid colon and diverticulosis. States bowels are doing better after colonoscopy. Breathing stable. No increased sob. No increased cough or congestion. Wearing oxygen at night.    Past Medical History:  Diagnosis Date   Allergy    Asthma    Chicken pox    COPD (chronic obstructive pulmonary disease) (HCC)    History of blood clots    Hypercholesterolemia    Hypertension    Retroperitoneal hematoma 03/2019   Past Surgical History:  Procedure Laterality Date   BIOPSY  03/08/2023   Procedure: BIOPSY;  Surgeon: Jaynie Collins, DO;  Location: [facility] ENDOSCOPY;  Service: Gastroenterology;;   blood clots  2008   COLONOSCOPY WITH PROPOFOL N/A 03/08/2023   Procedure: COLONOSCOPY WITH PROPOFOL;  Surgeon: Jaynie Collins, DO;  Location[facility] ENDOSCOPY;  Service: Gastroenterology;  Laterality: N/A;   ESOPHAGOGASTRODUODENOSCOPY (EGD) WITH PROPOFOL N/A 03/08/2023   Procedure: ESOPHAGOGASTRODUODENOSCOPY (EGD) WITH PROPOFOL;  Surgeon: Jaynie Collins, DO;  Location: [facility] ENDOSCOPY;  Service: Gastroenterology;  Laterality: N/A;   MALONEY DILATION  03/08/2023   Procedure: Elease Hashimoto DILATION;  Surgeon: Jaynie Collins, DO;  Location: [facility] ENDOSCOPY;  Service: Gastroenterology;;   POLYPECTOMY  03/08/2023   Procedure: POLYPECTOMY;  Surgeon: Jaynie Collins, DO;  Location: [facility] ENDOSCOPY;  Service: Gastroenterology;;   TUBAL LIGATION     Family History  Problem Relation Age of Onset   Cancer Mother        Breast   Heart disease Mother    Stroke Mother    Hypertension Mother    Breast cancer Mother        45's   Heart disease Father    Stroke Father     Hypertension Father    Diabetes Father    Colon cancer Other        paternal cousin   Social History   Socioeconomic History   Marital status: Widowed    Spouse name: Not on file   Number of children: 2   Years of education: Not on file   Highest education level: Not on file  Occupational History   Occupation: retired Education officer, environmental houses  Tobacco Use   Smoking status: Never    Passive exposure: Past   Smokeless tobacco: Never   Tobacco comments:    Exposed to chemical and 2nd had smoke.  Vaping Use   Vaping status: Never Used  Substance and Sexual Activity   Alcohol use: No    Alcohol/week: 0.0 standard drinks of alcohol   Drug use: No   Sexual activity: Not on file  Other Topics Concern   Not on file  Social History Narrative   Lives in [place] with son, 1 daughter who lives local.  never smoked; no alcohol. 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   Social Drivers of Health   Financial Resource Strain: Low Risk  (09/20/2022)   Overall Financial Resource Strain (CARDIA)    Difficulty of Paying Living Expenses: Not hard at all  Food Insecurity: No Food Insecurity (09/20/2022)   Hunger Vital Sign    Worried About Running Out of Food in the  Last Year: Never true    Ran Out of Food in the Last Year: Never true  Transportation Needs: No Transportation Needs (09/20/2022)   PRAPARE - Administrator, Civil Service (Medical): No    Lack of Transportation (Non-Medical): No  Physical Activity: Inactive (09/20/2022)   Exercise Vital Sign    Days of Exercise per Week: 0 days    Minutes of Exercise per Session: 0 min  Stress: No Stress Concern Present (09/20/2022)   [employer] of Occupational Health - Occupational Stress Questionnaire    Feeling of Stress : Not at all  Social Connections: Socially Isolated (09/20/2022)   Social Connection and Isolation Panel [NHANES]    Frequency of Communication with Friends and Family: More than three times a week     Frequency of Social Gatherings with Friends and Family: More than three times a week    Attends Religious Services: Never    Database administrator or Organizations: No    Attends Banker Meetings: Never    Marital Status: Widowed     Review of Systems  Constitutional:  Negative for appetite change and unexpected weight change.  HENT:  Negative for congestion and sinus pressure.   Respiratory:  Negative for cough and chest tightness.        Breathing stable. No increased sob.   Cardiovascular:  Negative for chest pain, palpitations and leg swelling.  Gastrointestinal:  Negative for abdominal pain, diarrhea, nausea and vomiting.  Genitourinary:  Negative for difficulty urinating and dysuria.  Musculoskeletal:  Negative for joint swelling and myalgias.  Skin:  Negative for color change and rash.  Neurological:  Negative for dizziness and headaches.  Psychiatric/Behavioral:  Negative for agitation and dysphoric mood.        Objective:     BP 122/74   Pulse 67   Temp (!) 97.5 F (36.4 C) (Oral)   Ht 5\' 4"  (1.626 m)   Wt 210 lb 3.2 oz (95.3 kg)   LMP 04/29/1997   SpO2 94%   BMI 36.08 kg/m  Wt Readings from Last 3 Encounters:  04/16/23 210 lb 3.2 oz (95.3 kg)  04/10/23 211 lb (95.7 kg)  03/08/23 215 lb (97.5 kg)    Physical Exam Vitals reviewed.  Constitutional:      General: She is not in acute distress.    Appearance: Normal appearance.  HENT:     Head: Normocephalic and atraumatic.     Right Ear: External ear normal.     Left Ear: External ear normal.     Mouth/Throat:     Pharynx: No oropharyngeal exudate or posterior oropharyngeal erythema.  Eyes:     General: No scleral icterus.       Right eye: No discharge.        Left eye: No discharge.     Conjunctiva/sclera: Conjunctivae normal.  Neck:     Thyroid: No thyromegaly.  Cardiovascular:     Rate and Rhythm: Normal rate and regular rhythm.  Pulmonary:     Effort: No respiratory distress.      Breath sounds: Normal breath sounds. No wheezing.     Comments: Declined breast exam.  Abdominal:     General: Bowel sounds are normal.     Palpations: Abdomen is soft.     Tenderness: There is no abdominal tenderness.  Musculoskeletal:        General: No swelling or tenderness.     Cervical back: Neck supple. No tenderness.  Lymphadenopathy:  Cervical: No cervical adenopathy.  Skin:    Findings: No erythema or rash.  Neurological:     Mental Status: She is alert.  Psychiatric:        Mood and Affect: Mood normal.        Behavior: Behavior normal.         Outpatient Encounter Medications as of 04/16/2023  Medication Sig   traMADol (ULTRAM) 50 MG tablet Take 1 tablet (50 mg total) by mouth 4 (four) times daily.   [DISCONTINUED] apixaban (ELIQUIS) 2.5 MG TABS tablet Take 1 tablet (2.5 mg total) by mouth 2 (two) times daily.   [DISCONTINUED] citalopram (CELEXA) 20 MG tablet Take 1 tablet (20 mg total) by mouth daily.   [DISCONTINUED] rosuvastatin (CRESTOR) 10 MG tablet Take 1 tablet (10 mg total) by mouth daily.   [DISCONTINUED] telmisartan (MICARDIS) 40 MG tablet Take 1 tablet (40 mg total) by mouth daily.   albuterol (ACCUNEB) 0.63 MG/3ML nebulizer solution Take 3 mLs (0.63 mg total) by nebulization every 6 (six) hours as needed for wheezing. (Patient not taking: Reported on 04/16/2023)   albuterol (VENTOLIN HFA) 108 (90 Base) MCG/ACT inhaler Inhale 2 puffs into the lungs every 6 (six) hours as needed for wheezing or shortness of breath. (Patient not taking: Reported on 04/16/2023)   apixaban (ELIQUIS) 2.5 MG TABS tablet Take 1 tablet (2.5 mg total) by mouth 2 (two) times daily.   citalopram (CELEXA) 20 MG tablet Take 1 tablet (20 mg total) by mouth daily.   rosuvastatin (CRESTOR) 10 MG tablet Take 1 tablet (10 mg total) by mouth daily.   telmisartan (MICARDIS) 40 MG tablet Take 1 tablet (40 mg total) by mouth daily.   No facility-administered encounter medications on file as of  04/16/2023.     Lab Results  Component Value Date   WBC 7.2 04/16/2023   HGB 14.2 04/16/2023   HCT 42.7 04/16/2023   PLT 245.0 04/16/2023   GLUCOSE 100 (H) 04/16/2023   CHOL 141 04/16/2023   TRIG 113.0 04/16/2023   HDL 42.20 04/16/2023   LDLDIRECT 81.0 04/13/2022   LDLCALC 76 04/16/2023   ALT 10 04/16/2023   AST 16 04/16/2023   NA 139 04/16/2023   K 4.3 04/16/2023   CL 104 04/16/2023   CREATININE 1.18 04/16/2023   BUN 13 04/16/2023   CO2 26 04/16/2023   TSH 3.48 04/16/2023   INR 1.2 04/23/2019   HGBA1C 6.0 04/16/2023       Assessment & Plan:  Routine general medical examination at a health care facility  Hypercholesterolemia Assessment & Plan: Continue crestor.  Low cholesterol diet and exercise.  Follow lipid panel and liver function tests.    Orders: -     CBC with Differential/Platelet -     Hepatic function panel -     Lipid panel -     TSH  Hyperglycemia Assessment & Plan: Low carb diet and exercise.  Follow met b and A1c.   Orders: -     Hemoglobin A1c  Essential hypertension, benign Assessment & Plan:  On micardis 40mg  q day.  Follow pressures.  Follow metabolic panel. No changes today.   Orders: -     Basic metabolic panel  Factor V Leiden [facility]) Assessment & Plan: On eliquis.     Stress Assessment & Plan: Continues on citalopram.  Does not feel needs any further intervention. Follow.    Spinal stenosis of lumbar region with neurogenic claudication Assessment & Plan: Being followed by pain clinic.  Chronic respiratory failure with hypoxia [facility]) Assessment & Plan: Using her oxygen at home.  Follow. Breathing stable.    Primary hypercoagulable state [facility]) Assessment & Plan: Continue eliquis as outlined.  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  Recommended indefinite anticoagulation.     Health care maintenance Assessment & Plan: Physical today 04/16/23.  Mammogram 11/15/20 - Birads I. Scheduled for f/u  mammogram.  Colonoscopy 03/28/23 - hyperplastic polyp.    Aortic atherosclerosis (HCC) Assessment & Plan: Continue crestor.    Other orders -     Apixaban; Take 1 tablet (2.5 mg total) by mouth 2 (two) times daily.  Dispense: 180 tablet; Refill: 1 -     Citalopram Hydrobromide; Take 1 tablet (20 mg total) by mouth daily.  Dispense: 90 tablet; Refill: 1 -     Rosuvastatin Calcium; Take 1 tablet (10 mg total) by mouth daily.  Dispense: 90 tablet; Refill: 3 -     Telmisartan; Take 1 tablet (40 mg total) by mouth daily.  Dispense: 90 tablet; Refill: 1     Dale [place], MD

## 2023-04-17 ENCOUNTER — Other Ambulatory Visit: Payer: Self-pay

## 2023-04-17 DIAGNOSIS — N183 Chronic kidney disease, stage 3 unspecified: Secondary | ICD-10-CM

## 2023-04-22 ENCOUNTER — Encounter: Payer: Self-pay | Admitting: Internal Medicine

## 2023-04-22 NOTE — Assessment & Plan Note (Signed)
 Physical today 04/16/23.  Mammogram 11/15/20 - Birads I. Scheduled for f/u mammogram.  Colonoscopy 03/28/23 - hyperplastic polyp.

## 2023-04-22 NOTE — Assessment & Plan Note (Signed)
 Low-carb diet and exercise.  Follow met b and A1c.

## 2023-04-22 NOTE — Assessment & Plan Note (Signed)
 On eliquis

## 2023-04-22 NOTE — Assessment & Plan Note (Signed)
Continues on citalopram.  Does not feel needs any further intervention. Follow.

## 2023-04-22 NOTE — Assessment & Plan Note (Signed)
Being followed by pain clinic.   

## 2023-04-22 NOTE — Assessment & Plan Note (Signed)
 Using her oxygen at home.  Follow. Breathing stable.

## 2023-04-22 NOTE — Assessment & Plan Note (Signed)
Continue eliquis as outlined.  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  Recommended indefinite anticoagulation.   

## 2023-04-22 NOTE — Assessment & Plan Note (Signed)
 On micardis 40mg  q day.  Follow pressures.  Follow metabolic panel. No changes today.

## 2023-04-22 NOTE — Assessment & Plan Note (Signed)
 Continue crestor

## 2023-04-22 NOTE — Assessment & Plan Note (Signed)
 Continue crestor.  Low cholesterol diet and exercise.  Follow lipid panel and liver function tests.

## 2023-05-08 ENCOUNTER — Other Ambulatory Visit (INDEPENDENT_AMBULATORY_CARE_PROVIDER_SITE_OTHER)

## 2023-05-08 DIAGNOSIS — N183 Chronic kidney disease, stage 3 unspecified: Secondary | ICD-10-CM | POA: Diagnosis not present

## 2023-05-09 LAB — BASIC METABOLIC PANEL WITH GFR
BUN: 15 mg/dL (ref 6–23)
CO2: 29 meq/L (ref 19–32)
Calcium: 9.1 mg/dL (ref 8.4–10.5)
Chloride: 102 meq/L (ref 96–112)
Creatinine, Ser: 1.15 mg/dL (ref 0.40–1.20)
GFR: 45.29 mL/min — ABNORMAL LOW (ref >60.00)
Glucose, Bld: 90 mg/dL (ref 70–99)
Potassium: 4 meq/L (ref 3.5–5.1)
Sodium: 140 meq/L (ref 135–145)

## 2023-06-04 ENCOUNTER — Ambulatory Visit: Attending: Anesthesiology | Admitting: Anesthesiology

## 2023-06-04 ENCOUNTER — Encounter: Payer: Self-pay | Admitting: Anesthesiology

## 2023-06-04 DIAGNOSIS — M5136 Other intervertebral disc degeneration, lumbar region with discogenic back pain only: Secondary | ICD-10-CM

## 2023-06-04 DIAGNOSIS — M48062 Spinal stenosis, lumbar region with neurogenic claudication: Secondary | ICD-10-CM

## 2023-06-04 DIAGNOSIS — M1711 Unilateral primary osteoarthritis, right knee: Secondary | ICD-10-CM

## 2023-06-04 DIAGNOSIS — M47816 Spondylosis without myelopathy or radiculopathy, lumbar region: Secondary | ICD-10-CM

## 2023-06-04 DIAGNOSIS — M25551 Pain in right hip: Secondary | ICD-10-CM | POA: Diagnosis not present

## 2023-06-04 DIAGNOSIS — G894 Chronic pain syndrome: Secondary | ICD-10-CM | POA: Diagnosis not present

## 2023-06-04 DIAGNOSIS — F1190 Opioid use, unspecified, uncomplicated: Secondary | ICD-10-CM | POA: Diagnosis not present

## 2023-06-04 DIAGNOSIS — M25552 Pain in left hip: Secondary | ICD-10-CM | POA: Diagnosis not present

## 2023-06-04 MED ORDER — TRAMADOL HCL 50 MG PO TABS: 50.0000 mg | ORAL_TABLET | Freq: Four times a day (QID) | ORAL | 2 refills | Status: DC

## 2023-06-06 NOTE — Progress Notes (Signed)
 Virtual Visit via Telephone Note  I connected with Tina Duarte on 06/06/23 at 10:00 AM EDT by telephone and verified that I am speaking with the correct person using two identifiers.  Location: Patient: Home Provider: Pain control center   I discussed the limitations, risks, security and privacy concerns of performing an evaluation and management service by telephone and the availability of in person appointments. I also discussed with the patient that there may be a patient responsible charge related to this service. The patient expressed understanding and agreed to proceed.   History of Present Illness: I spoke with Tina Duarte via telephone as we were unable link for the video portion of the conference.  She reports that she is doing similarly to our last discussion a few months ago.  No other changes in the quality characteristic or distribution of her back pain are noted.  She is doing daily stretching exercises as tolerated trying to stay active especially with the warmer weather.  The primary pain remains in the lower back especially with increased activity it is a gnawing aching pain occasionally radiating into the hip buttock area.  No change in lower extremity strength function bowel or bladder function are noted.  She continues take her tramadol  effectively and this helps with the pain relief rated about 50% with no side effects noted lasting about 4 to 6 hours before she has recurrence of her baseline pain.  Review of systems: General: No fevers or chills Pulmonary: No shortness of breath or dyspnea Cardiac: No angina or palpitations or lightheadedness GI: No abdominal pain or constipation Psych: No depression    Observations/Objective:  Current Outpatient Medications:    albuterol  (ACCUNEB ) 0.63 MG/3ML nebulizer solution, Take 3 mLs (0.63 mg total) by nebulization every 6 (six) hours as needed for wheezing. (Patient not taking: Reported on 04/16/2023), Disp: 75 mL, Rfl: 3    albuterol  (VENTOLIN  HFA) 108 (90 Base) MCG/ACT inhaler, Inhale 2 puffs into the lungs every 6 (six) hours as needed for wheezing or shortness of breath. (Patient not taking: Reported on 04/16/2023), Disp: 18 g, Rfl: 2   apixaban  (ELIQUIS ) 2.5 MG TABS tablet, Take 1 tablet (2.5 mg total) by mouth 2 (two) times daily., Disp: 180 tablet, Rfl: 1   citalopram  (CELEXA ) 20 MG tablet, Take 1 tablet (20 mg total) by mouth daily., Disp: 90 tablet, Rfl: 1   rosuvastatin  (CRESTOR ) 10 MG tablet, Take 1 tablet (10 mg total) by mouth daily., Disp: 90 tablet, Rfl: 3   telmisartan  (MICARDIS ) 40 MG tablet, Take 1 tablet (40 mg total) by mouth daily., Disp: 90 tablet, Rfl: 1   [START ON 06/16/2023] traMADol  (ULTRAM ) 50 MG tablet, Take 1 tablet (50 mg total) by mouth 4 (four) times daily., Disp: 120 tablet, Rfl: 2   Past Medical History:  Diagnosis Date   Allergy    Asthma    Chicken pox    COPD (chronic obstructive pulmonary disease) (HCC)    History of blood clots    Hypercholesterolemia    Hypertension    Retroperitoneal hematoma 03/2019   Assessment and Plan:  1. Primary osteoarthritis of right knee   2. Spinal stenosis of lumbar region with neurogenic claudication   3. Hip pain, bilateral   4. Facet arthritis of lumbar region   5. Degeneration of intervertebral disc of lumbar region with discogenic back pain   6. Chronic pain syndrome   7. Chronic, continuous use of opioids    Based on conversation upon review of  the [place]  practitioner database information I gets appropriate to refill her medicines for the next few months.  Will schedule her for return to clinic in 2 to 3 months for reevaluation.  Continue with stretching strengthening exercises as tolerated continue with current medication management and follow-up with her primary care physician for baseline medical care. Follow Up Instructions:    I discussed the assessment and treatment plan with the patient. The patient was provided an  opportunity to ask questions and all were answered. The patient agreed with the plan and demonstrated an understanding of the instructions.   The patient was advised to call back or seek an in-person evaluation if the symptoms worsen or if the condition fails to improve as anticipated.  I provided 30 minutes of non-face-to-face time during this encounter.   Zula Hitch, MD

## 2023-06-29 ENCOUNTER — Telehealth: Payer: Self-pay | Admitting: Internal Medicine

## 2023-06-29 NOTE — Telephone Encounter (Signed)
 Dr Geralyn Knee will not be in the office the day of your scheduled visit 08/17/23 . Please give the office a call at [phone] and we will get you rescheduled.  Thank you  LMOM and sent above message via MyChart. E2C2 please reschedule this pt when they call back -[facility]

## 2023-07-25 ENCOUNTER — Ambulatory Visit: Attending: Anesthesiology | Admitting: Anesthesiology

## 2023-07-25 ENCOUNTER — Encounter: Payer: Self-pay | Admitting: Anesthesiology

## 2023-07-25 DIAGNOSIS — M47816 Spondylosis without myelopathy or radiculopathy, lumbar region: Secondary | ICD-10-CM | POA: Diagnosis not present

## 2023-07-25 DIAGNOSIS — G894 Chronic pain syndrome: Secondary | ICD-10-CM

## 2023-07-25 DIAGNOSIS — M25552 Pain in left hip: Secondary | ICD-10-CM

## 2023-07-25 DIAGNOSIS — M5136 Other intervertebral disc degeneration, lumbar region with discogenic back pain only: Secondary | ICD-10-CM

## 2023-07-25 DIAGNOSIS — F1190 Opioid use, unspecified, uncomplicated: Secondary | ICD-10-CM | POA: Diagnosis not present

## 2023-07-25 DIAGNOSIS — M25551 Pain in right hip: Secondary | ICD-10-CM | POA: Diagnosis not present

## 2023-07-25 DIAGNOSIS — M48062 Spinal stenosis, lumbar region with neurogenic claudication: Secondary | ICD-10-CM | POA: Diagnosis not present

## 2023-07-25 DIAGNOSIS — M1711 Unilateral primary osteoarthritis, right knee: Secondary | ICD-10-CM | POA: Diagnosis not present

## 2023-07-25 MED ORDER — TRAMADOL HCL 50 MG PO TABS: 50.0000 mg | ORAL_TABLET | ORAL | 1 refills | Status: DC

## 2023-07-25 NOTE — Progress Notes (Signed)
 Virtual Visit via Telephone Note  I connected with Tina Duarte on 07/25/23 at  1:00 PM EDT by telephone and verified that I am speaking with the correct person using two identifiers.  Location: Patient: Home Provider: [facility] control center   I discussed the limitations, risks, security and privacy concerns of performing an evaluation and management service by telephone and the availability of in person appointments. I also discussed with the patient that there may be a patient responsible charge related to this service. The patient expressed understanding and agreed to proceed.   History of Present Illness: I spoke to Tina Duarte via telephone as we are unable to link for the video portion of the conference.  She reports that she is getting good relief with the tramadol  2 tablets in the morning 2 tablets in the early afternoon.  This is helping with her hip and back pain and she gets about 50% relief.  She still has some pain if she is overly active but her primary complaint is breakthrough pain at night.  No side effects with the medication reported and the quality characteristic and distribution of the pain that she is experiencing is stable in nature with no recent changes reported.  Lower extremity strength function bowel and bladder function of all been stable.  Otherwise she is doing reasonably well and trying to stay active.  She does daily stretching as best she can.  Review of systems: General: No fevers or chills Pulmonary: No shortness of breath or dyspnea Cardiac: No angina or palpitations or lightheadedness GI: No abdominal pain or constipation Psych: No depression    Observations/Objective:  Current Outpatient Medications:    albuterol  (ACCUNEB ) 0.63 MG/3ML nebulizer solution, Take 3 mLs (0.63 mg total) by nebulization every 6 (six) hours as needed for wheezing. (Patient not taking: Reported on 04/16/2023), Disp: 75 mL, Rfl: 3   albuterol  (VENTOLIN  HFA) 108 (90 Base) MCG/ACT  inhaler, Inhale 2 puffs into the lungs every 6 (six) hours as needed for wheezing or shortness of breath. (Patient not taking: Reported on 04/16/2023), Disp: 18 g, Rfl: 2   apixaban  (ELIQUIS ) 2.5 MG TABS tablet, Take 1 tablet (2.5 mg total) by mouth 2 (two) times daily., Disp: 180 tablet, Rfl: 1   citalopram  (CELEXA ) 20 MG tablet, Take 1 tablet (20 mg total) by mouth daily., Disp: 90 tablet, Rfl: 1   rosuvastatin  (CRESTOR ) 10 MG tablet, Take 1 tablet (10 mg total) by mouth daily., Disp: 90 tablet, Rfl: 3   telmisartan  (MICARDIS ) 40 MG tablet, Take 1 tablet (40 mg total) by mouth daily., Disp: 90 tablet, Rfl: 1   [START ON 08/09/2023] traMADol  (ULTRAM ) 50 MG tablet, Take 1 tablet (50 mg total) by mouth every 4 (four) hours., Disp: 150 tablet, Rfl: 1   Past Medical History:  Diagnosis Date   Allergy    Asthma    Chicken pox    COPD (chronic obstructive pulmonary disease) (HCC)    History of blood clots    Hypercholesterolemia    Hypertension    Retroperitoneal hematoma 03/2019   Assessment and Plan:  1. Primary osteoarthritis of right knee   2. Spinal stenosis of lumbar region with neurogenic claudication   3. Hip pain, bilateral   4. Facet arthritis of lumbar region   5. Degeneration of intervertebral disc of lumbar region with discogenic back pain   6. Chronic pain syndrome   7. Chronic, continuous use of opioids    Based on our discussion after review of  the [employer]  practitioner database information it is appropriate to refill her medicines.  Going to increase her to 150 tablets for 2 tablets in the morning 2 tablets in the afternoon and 1 tablet at bedtime or some combination thereof.  She can also spend this out to every 4 hours if that works better for her.  She is doing well with chronic opioid therapy and the tramadol  seems to work well for her.  Unfortunately she has failed more conservative therapy.  No other changes in her regimen are initiated.  Have encouraged her to  continue with stretching exercises on daily basis and increase activity outside as tolerated with walking.  Will schedule for follow-up in 1 month with the recent dose change and she is to continue follow-up with her primary care physicians for baseline medical care. Follow Up Instructions:    I discussed the assessment and treatment plan with the patient. The patient was provided an opportunity to ask questions and all were answered. The patient agreed with the plan and demonstrated an understanding of the instructions.   The patient was advised to call back or seek an in-person evaluation if the symptoms worsen or if the condition fails to improve as anticipated.  I provided 30 minutes of non-face-to-face time during this encounter.   Lynwood KANDICE Clause, MD

## 2023-08-15 ENCOUNTER — Ambulatory Visit: Payer: Self-pay | Attending: Anesthesiology | Admitting: Anesthesiology

## 2023-08-15 ENCOUNTER — Encounter: Payer: Self-pay | Admitting: Anesthesiology

## 2023-08-15 DIAGNOSIS — M25551 Pain in right hip: Secondary | ICD-10-CM

## 2023-08-15 DIAGNOSIS — M1711 Unilateral primary osteoarthritis, right knee: Secondary | ICD-10-CM

## 2023-08-15 DIAGNOSIS — G894 Chronic pain syndrome: Secondary | ICD-10-CM

## 2023-08-15 DIAGNOSIS — M25552 Pain in left hip: Secondary | ICD-10-CM | POA: Diagnosis not present

## 2023-08-15 DIAGNOSIS — M47816 Spondylosis without myelopathy or radiculopathy, lumbar region: Secondary | ICD-10-CM

## 2023-08-15 DIAGNOSIS — M48062 Spinal stenosis, lumbar region with neurogenic claudication: Secondary | ICD-10-CM | POA: Diagnosis not present

## 2023-08-15 DIAGNOSIS — M5136 Other intervertebral disc degeneration, lumbar region with discogenic back pain only: Secondary | ICD-10-CM

## 2023-08-15 DIAGNOSIS — F1190 Opioid use, unspecified, uncomplicated: Secondary | ICD-10-CM

## 2023-08-15 DIAGNOSIS — Z79891 Long term (current) use of opiate analgesic: Secondary | ICD-10-CM

## 2023-08-15 MED ORDER — TRAMADOL HCL 50 MG PO TABS: 50.0000 mg | ORAL_TABLET | ORAL | 2 refills | Status: AC

## 2023-08-15 NOTE — Progress Notes (Signed)
 Virtual Visit via Telephone Note  I connected with Tina Duarte on 08/15/23 at  4:00 PM EDT by telephone and verified that I am speaking with the correct person using two identifiers.  Location: Patient: Home Provider: Pain control center   I discussed the limitations, risks, security and privacy concerns of performing an evaluation and management service by telephone and the availability of in person appointments. I also discussed with the patient that there may be a patient responsible charge related to this service. The patient expressed understanding and agreed to proceed.   History of Present Illness: I spoke with Tina Duarte today via telephone as we were unable link for the video portion of the conference.  She reports that she recently fell and landed on her buttocks with no complications other than some muscular discomfort.  She feels that she is recovering well from that and has not had any other complicating problems.  She still having some low back pain comparable to what she is experienced in the past that is responsive to tramadol .  She takes 50 mg tablets 1 or 2 at a time approximately 3 times a day averaging 150/month and this is giving her good relief and enabling her to stay active functional and sleep better at night.  Otherwise she is in her usual state of health.  She continues to try and do some stretching strengthening exercises as tolerated.  Review of systems: General: No fevers or chills Pulmonary: No shortness of breath or dyspnea Cardiac: No angina or palpitations or lightheadedness GI: No abdominal pain or constipation Psych: No depression    Observations/Objective:  Current Outpatient Medications:    albuterol  (ACCUNEB ) 0.63 MG/3ML nebulizer solution, Take 3 mLs (0.63 mg total) by nebulization every 6 (six) hours as needed for wheezing. (Patient not taking: Reported on 04/16/2023), Disp: 75 mL, Rfl: 3   albuterol  (VENTOLIN  HFA) 108 (90 Base) MCG/ACT inhaler,  Inhale 2 puffs into the lungs every 6 (six) hours as needed for wheezing or shortness of breath. (Patient not taking: Reported on 04/16/2023), Disp: 18 g, Rfl: 2   apixaban  (ELIQUIS ) 2.5 MG TABS tablet, Take 1 tablet (2.5 mg total) by mouth 2 (two) times daily., Disp: 180 tablet, Rfl: 1   citalopram  (CELEXA ) 20 MG tablet, Take 1 tablet (20 mg total) by mouth daily., Disp: 90 tablet, Rfl: 1   rosuvastatin  (CRESTOR ) 10 MG tablet, Take 1 tablet (10 mg total) by mouth daily., Disp: 90 tablet, Rfl: 3   telmisartan  (MICARDIS ) 40 MG tablet, Take 1 tablet (40 mg total) by mouth daily., Disp: 90 tablet, Rfl: 1   traMADol  (ULTRAM ) 50 MG tablet, Take 1 tablet (50 mg total) by mouth every 4 (four) hours., Disp: 150 tablet, Rfl: 2   Past Medical History:  Diagnosis Date   Allergy    Asthma    Chicken pox    COPD (chronic obstructive pulmonary disease) (HCC)    History of blood clots    Hypercholesterolemia    Hypertension    Retroperitoneal hematoma 03/2019   Assessment and Plan:  1. Primary osteoarthritis of right knee   2. Spinal stenosis of lumbar region with neurogenic claudication   3. Hip pain, bilateral   4. Facet arthritis of lumbar region   5. Degeneration of intervertebral disc of lumbar region with discogenic back pain   6. Chronic pain syndrome   7. Chronic, continuous use of opioids    Based on our conversation it is appropriate to refill her medicines for  the next 2 months.  This will be for tramadol  50 mg tablets 1 or 2 tablets 2 or 3 times a day for 150 tablets/month.  I have encouraged her to continue application of heat or ice to the area where she fell.  She describes no persisting pain or evidence of fracture but should this worsen or fail to improve I have encouraged her to follow-up with the emergency room for evaluation and possible x-rays.  Additionally, she has been having some chronic knee issues and I have advocated for Dr. Chyrl Maltos as a orthopedic evaluation.  We will  schedule her for a 26-month return to clinic. Follow Up Instructions:    I discussed the assessment and treatment plan with the patient. The patient was provided an opportunity to ask questions and all were answered. The patient agreed with the plan and demonstrated an understanding of the instructions.   The patient was advised to call back or seek an in-person evaluation if the symptoms worsen or if the condition fails to improve as anticipated.  I provided 30 minutes of non-face-to-face time during this encounter.   Lynwood KANDICE Clause, MD

## 2023-08-17 ENCOUNTER — Ambulatory Visit: Admitting: Internal Medicine

## 2023-09-06 ENCOUNTER — Encounter: Payer: Self-pay | Admitting: Internal Medicine

## 2023-09-06 ENCOUNTER — Ambulatory Visit (INDEPENDENT_AMBULATORY_CARE_PROVIDER_SITE_OTHER): Admitting: Internal Medicine

## 2023-09-06 VITALS — BP 118/64 | HR 63 | Temp 97.8°F | Resp 20 | Ht 64.0 in | Wt 213.5 lb

## 2023-09-06 DIAGNOSIS — I10 Essential (primary) hypertension: Secondary | ICD-10-CM

## 2023-09-06 DIAGNOSIS — D6851 Activated protein C resistance: Secondary | ICD-10-CM | POA: Diagnosis not present

## 2023-09-06 DIAGNOSIS — E7800 Pure hypercholesterolemia, unspecified: Secondary | ICD-10-CM | POA: Diagnosis not present

## 2023-09-06 DIAGNOSIS — Z1211 Encounter for screening for malignant neoplasm of colon: Secondary | ICD-10-CM

## 2023-09-06 DIAGNOSIS — J4520 Mild intermittent asthma, uncomplicated: Secondary | ICD-10-CM

## 2023-09-06 DIAGNOSIS — R739 Hyperglycemia, unspecified: Secondary | ICD-10-CM

## 2023-09-06 DIAGNOSIS — Z13820 Encounter for screening for osteoporosis: Secondary | ICD-10-CM

## 2023-09-06 DIAGNOSIS — I700 Atherosclerosis of aorta: Secondary | ICD-10-CM

## 2023-09-06 DIAGNOSIS — M5442 Lumbago with sciatica, left side: Secondary | ICD-10-CM | POA: Diagnosis not present

## 2023-09-06 DIAGNOSIS — R531 Weakness: Secondary | ICD-10-CM

## 2023-09-06 DIAGNOSIS — J9611 Chronic respiratory failure with hypoxia: Secondary | ICD-10-CM

## 2023-09-06 DIAGNOSIS — F439 Reaction to severe stress, unspecified: Secondary | ICD-10-CM

## 2023-09-06 NOTE — Progress Notes (Signed)
 Subjective:    [name] List, female    DOB: Sep 29, 1945, 78 y.o.   MRN: [id number]  Patient here for  Chief Complaint  Patient presents with   Medical Management of Chronic Issues    4 month follow up    HPI Here for a scheduled follow up. Followed at pain clinic - chronic low back pain - taking tramadol . S/p colonoscopy 03/08/23 - hemorrhoids, one 1-51mm polyp in the sigmoid colon and diverticulosis. Continues on micardis .  Rpeorts she is doing relatively well. Reports stress is better. No chest pain. Breathing is stable. Denies increased cough or congestion. No abdominal pain or bowel change. Reports some muscle weakness - legs. Discussed PT. Wants to hold off. Will notify me if changes her mind.    Past Medical History:  Diagnosis Date   Allergy    Asthma    Chicken pox    COPD (chronic obstructive pulmonary disease) (HCC)    History of blood clots    Hypercholesterolemia    Hypertension    Retroperitoneal hematoma 03/2019   Past Surgical History:  Procedure Laterality Date   BIOPSY  03/08/2023   Procedure: BIOPSY;  Surgeon: Onita Elspeth Sharper, DO;  Location: [facility] ENDOSCOPY;  Service: Gastroenterology;;   blood clots  2008   COLONOSCOPY WITH PROPOFOL  N/A 03/08/2023   Procedure: COLONOSCOPY WITH PROPOFOL ;  Surgeon: Onita Elspeth Sharper, DO;  Location[facility] ENDOSCOPY;  Service: Gastroenterology;  Laterality: N/A;   ESOPHAGOGASTRODUODENOSCOPY (EGD) WITH PROPOFOL  N/A 03/08/2023   Procedure: ESOPHAGOGASTRODUODENOSCOPY (EGD) WITH PROPOFOL ;  Surgeon: Onita Elspeth Sharper, DO;  Location: [facility] ENDOSCOPY;  Service: Gastroenterology;  Laterality: N/A;   MALONEY DILATION  03/08/2023   Procedure: AGAPITO DILATION;  Surgeon: Onita Elspeth Sharper, DO;  Location: [facility] ENDOSCOPY;  Service: Gastroenterology;;   POLYPECTOMY  03/08/2023   Procedure: POLYPECTOMY;  Surgeon: Onita Elspeth Sharper, DO;  Location: [facility] ENDOSCOPY;  Service: Gastroenterology;;   TUBAL LIGATION     Family  History  Problem Relation Age of Onset   Cancer Mother        Breast   Heart disease Mother    Stroke Mother    Hypertension Mother    Breast cancer Mother        26's   Heart disease Father    Stroke Father    Hypertension Father    Diabetes Father    Colon cancer Other        paternal cousin   Social History   Socioeconomic History   Marital status: Widowed    Spouse name: Not on file   Number of children: 2   Years of education: Not on file   Highest education level: Not on file  Occupational History   Occupation: retired Education officer, environmental houses  Tobacco Use   Smoking status: Never    Passive exposure: Past   Smokeless tobacco: Never   Tobacco comments:    Exposed to chemical and 2nd had smoke.  Vaping Use   Vaping status: Never Used  Substance and Sexual Activity   Alcohol use: No    Alcohol/week: 0.0 standard drinks of alcohol   Drug use: No   Sexual activity: Not on file  Other Topics Concern   Not on file  Social History Narrative   Lives in [place] with son, 1 daughter who lives local.  never smoked; no alcohol. 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   Social Drivers of Health   Financial Resource Strain: Low Risk  (09/20/2022)  Overall Financial Resource Strain (CARDIA)    Difficulty of Paying Living Expenses: Not hard at all  Food Insecurity: No Food Insecurity (09/20/2022)   Hunger Vital Sign    Worried About Running Out of Food in the Last Year: Never true    Ran Out of Food in the Last Year: Never true  Transportation Needs: No Transportation Needs (09/20/2022)   PRAPARE - Administrator, Civil Service (Medical): No    Lack of Transportation (Non-Medical): No  Physical Activity: Inactive (09/20/2022)   Exercise Vital Sign    Days of Exercise per Week: 0 days    Minutes of Exercise per Session: 0 min  Stress: No Stress Concern Present (09/20/2022)   [employer] of Occupational Health - Occupational Stress Questionnaire     Feeling of Stress : Not at all  Social Connections: Socially Isolated (09/20/2022)   Social Connection and Isolation Panel    Frequency of Communication with Friends and Family: More than three times a week    Frequency of Social Gatherings with Friends and Family: More than three times a week    Attends Religious Services: Never    Database administrator or Organizations: No    Attends Banker Meetings: Never    Marital Status: Widowed     Review of Systems  Constitutional:  Negative for appetite change and unexpected weight change.  HENT:  Negative for congestion and sinus pressure.   Respiratory:  Negative for cough and chest tightness.        Breathing stable. No increased cough.   Cardiovascular:  Negative for chest pain and palpitations.       No increased swelling.   Gastrointestinal:  Negative for abdominal pain, diarrhea, nausea and vomiting.  Genitourinary:  Negative for difficulty urinating and dysuria.  Musculoskeletal:  Negative for joint swelling and myalgias.       Chronic back pain.   Skin:  Negative for color change and rash.  Neurological:  Negative for dizziness and headaches.  Psychiatric/Behavioral:  Negative for agitation and dysphoric mood.        Objective:     BP 118/64   Pulse 63   Temp 97.8 F (36.6 C)   Resp 20   Ht 5' 4 (1.626 m)   Wt 213 lb 8 oz (96.8 kg)   LMP 04/29/1997   SpO2 95%   BMI 36.65 kg/m  Wt Readings from Last 3 Encounters:  09/06/23 213 lb 8 oz (96.8 kg)  04/16/23 210 lb 3.2 oz (95.3 kg)  04/10/23 211 lb (95.7 kg)    Physical Exam Vitals reviewed.  Constitutional:      General: She is not in acute distress.    Appearance: Normal appearance.  HENT:     Head: Normocephalic and atraumatic.     Right Ear: External ear normal.     Left Ear: External ear normal.     Mouth/Throat:     Pharynx: No oropharyngeal exudate or posterior oropharyngeal erythema.  Eyes:     General: No scleral icterus.       Right  eye: No discharge.        Left eye: No discharge.     Conjunctiva/sclera: Conjunctivae normal.  Neck:     Thyroid : No thyromegaly.  Cardiovascular:     Rate and Rhythm: Normal rate and regular rhythm.  Pulmonary:     Effort: No respiratory distress.     Breath sounds: Normal breath sounds. No wheezing.  Abdominal:  General: Bowel sounds are normal.     Palpations: Abdomen is soft.     Tenderness: There is no abdominal tenderness.  Musculoskeletal:        General: No swelling or tenderness.     Cervical back: Neck supple. No tenderness.  Lymphadenopathy:     Cervical: No cervical adenopathy.  Skin:    Findings: No erythema or rash.  Neurological:     Mental Status: She is alert.  Psychiatric:        Mood and Affect: Mood normal.        Behavior: Behavior normal.         Outpatient Encounter Medications as of 09/06/2023  Medication Sig   albuterol  (ACCUNEB ) 0.63 MG/3ML nebulizer solution Take 3 mLs (0.63 mg total) by nebulization every 6 (six) hours as needed for wheezing.   albuterol  (VENTOLIN  HFA) 108 (90 Base) MCG/ACT inhaler Inhale 2 puffs into the lungs every 6 (six) hours as needed for wheezing or shortness of breath.   apixaban  (ELIQUIS ) 2.5 MG TABS tablet Take 1 tablet (2.5 mg total) by mouth 2 (two) times daily.   citalopram  (CELEXA ) 20 MG tablet Take 1 tablet (20 mg total) by mouth daily.   rosuvastatin  (CRESTOR ) 10 MG tablet Take 1 tablet (10 mg total) by mouth daily.   telmisartan  (MICARDIS ) 40 MG tablet Take 1 tablet (40 mg total) by mouth daily.   traMADol  (ULTRAM ) 50 MG tablet Take 1 tablet (50 mg total) by mouth every 4 (four) hours. (Patient taking differently: Take 100 mg by mouth every 4 (four) hours.)   No facility-administered encounter medications on file as of 09/06/2023.     Lab Results  Component Value Date   WBC 7.2 04/16/2023   HGB 14.2 04/16/2023   HCT 42.7 04/16/2023   PLT 245.0 04/16/2023   GLUCOSE 78 09/06/2023   CHOL 138 09/06/2023    TRIG 131.0 09/06/2023   HDL 44.50 09/06/2023   LDLDIRECT 81.0 04/13/2022   LDLCALC 68 09/06/2023   ALT 11 09/06/2023   AST 16 09/06/2023   NA 138 09/06/2023   K 4.5 09/06/2023   CL 100 09/06/2023   CREATININE 1.15 09/06/2023   BUN 16 09/06/2023   CO2 28 09/06/2023   TSH 3.48 04/16/2023   INR 1.2 04/23/2019   HGBA1C 6.2 09/06/2023       Assessment & Plan:  Hypercholesterolemia Assessment & Plan: Continue crestor .  Low cholesterol diet and exercise.  Follow lipid panel.   Orders: -     Lipid panel -     Hepatic function panel  Hyperglycemia Assessment & Plan: Low carb diet and exercise. Follow met b and A1c.   Orders: -     Hemoglobin A1c  Essential hypertension, benign Assessment & Plan:  On micardis  40mg  q day.  Follow pressures.  Follow metabolic panel.  No changes in medication today.   Orders: -     Basic metabolic panel with GFR  Screening for osteoporosis -     DG Bone Density; Future  Aortic atherosclerosis (HCC) Assessment & Plan: Continue crestor .    Mild intermittent asthma without complication Assessment & Plan: Breathing stable.    Colon cancer screening Assessment & Plan: S/p colonoscopy 03/08/23 - hemorrhoids, one 1-63mm polyp in the sigmoid colon and diverticulosis.   Factor V Leiden (HCC) Assessment & Plan: Continue eliquis .    Left-sided low back pain with left-sided sciatica, unspecified chronicity Assessment & Plan: Followed by pain clinic. Dr Myra.    Chronic respiratory  failure with hypoxia [facility]) Assessment & Plan: Breathing stable. Oxygen .    Stress Assessment & Plan: Continue on citalopram . Reports she is doing better. Follow.    Weakness Assessment & Plan: Reports some muscle weakness - legs not as strong. Discussed PT. She will try exercises on her own. Notify me if desires referral for home PT.       Allena Hamilton, MD

## 2023-09-07 LAB — BASIC METABOLIC PANEL WITH GFR
BUN: 16 mg/dL (ref 6–23)
CO2: 28 meq/L (ref 19–32)
Calcium: 8.7 mg/dL (ref 8.4–10.5)
Chloride: 100 meq/L (ref 96–112)
Creatinine, Ser: 1.15 mg/dL (ref 0.40–1.20)
GFR: 45.18 mL/min — ABNORMAL LOW (ref >60.00)
Glucose, Bld: 78 mg/dL (ref 70–99)
Potassium: 4.5 meq/L (ref 3.5–5.1)
Sodium: 138 meq/L (ref 135–145)

## 2023-09-07 LAB — HEPATIC FUNCTION PANEL
ALT: 11 U/L (ref 0–35)
AST: 16 U/L (ref 0–37)
Albumin: 4 g/dL (ref 3.5–5.2)
Alkaline Phosphatase: 69 U/L (ref 39–117)
Bilirubin, Direct: 0.2 mg/dL (ref 0.0–0.3)
Total Bilirubin: 1.1 mg/dL (ref 0.2–1.2)
Total Protein: 6.5 g/dL (ref 6.0–8.3)

## 2023-09-07 LAB — LIPID PANEL
Cholesterol: 138 mg/dL (ref 0–200)
HDL: 44.5 mg/dL (ref >39.00)
LDL Cholesterol: 68 mg/dL (ref 0–99)
NonHDL: 93.84
Total CHOL/HDL Ratio: 3
Triglycerides: 131 mg/dL (ref 0.0–149.0)
VLDL: 26.2 mg/dL (ref 0.0–40.0)

## 2023-09-07 LAB — HEMOGLOBIN A1C: Hgb A1c MFr Bld: 6.2 % (ref 4.6–6.5)

## 2023-09-08 ENCOUNTER — Ambulatory Visit: Payer: Self-pay | Admitting: Internal Medicine

## 2023-09-09 ENCOUNTER — Encounter: Payer: Self-pay | Admitting: Internal Medicine

## 2023-09-09 NOTE — Assessment & Plan Note (Signed)
 Reports some muscle weakness - legs not as strong. Discussed PT. She will try exercises on her own. Notify me if desires referral for home PT.

## 2023-09-09 NOTE — Assessment & Plan Note (Signed)
Followed by pain clinic.  Dr Pernell Dupre

## 2023-09-09 NOTE — Assessment & Plan Note (Signed)
 Breathing stable. Oxygen .

## 2023-09-09 NOTE — Assessment & Plan Note (Signed)
 S/p colonoscopy 03/08/23 - hemorrhoids, one 1-22mm polyp in the sigmoid colon and diverticulosis.

## 2023-09-09 NOTE — Assessment & Plan Note (Signed)
 Breathing stable.

## 2023-09-09 NOTE — Assessment & Plan Note (Signed)
 Continue eliquis  ?

## 2023-09-09 NOTE — Assessment & Plan Note (Signed)
 Low-carb diet and exercise.  Follow met b and A1c.

## 2023-09-09 NOTE — Assessment & Plan Note (Signed)
Continue crestor.  Low cholesterol diet and exercise.  Follow lipid panel.  

## 2023-09-09 NOTE — Assessment & Plan Note (Signed)
 On micardis  40mg  q day.  Follow pressures.  Follow metabolic panel.  No changes in medication today.

## 2023-09-09 NOTE — Assessment & Plan Note (Signed)
 Continue on citalopram . Reports she is doing better. Follow.

## 2023-09-09 NOTE — Assessment & Plan Note (Signed)
 Continue crestor

## 2023-10-10 ENCOUNTER — Encounter: Payer: Self-pay | Admitting: *Deleted

## 2023-10-15 ENCOUNTER — Ambulatory Visit (INDEPENDENT_AMBULATORY_CARE_PROVIDER_SITE_OTHER): Admitting: *Deleted

## 2023-10-15 ENCOUNTER — Telehealth: Payer: Self-pay | Admitting: *Deleted

## 2023-10-15 VITALS — Ht 64.0 in | Wt 210.0 lb

## 2023-10-15 DIAGNOSIS — M79606 Pain in leg, unspecified: Secondary | ICD-10-CM

## 2023-10-15 DIAGNOSIS — Z0000 Encounter for general adult medical examination without abnormal findings: Secondary | ICD-10-CM | POA: Diagnosis not present

## 2023-10-15 DIAGNOSIS — M549 Dorsalgia, unspecified: Secondary | ICD-10-CM

## 2023-10-15 NOTE — Telephone Encounter (Signed)
 Performed AWV Patient stated that she wants Dr. Glendia to go ahead and place an order for physical therapy for her back and legs. Patient stated that Dr. Glendia has talked with her previously about PT.

## 2023-10-15 NOTE — Patient Instructions (Signed)
 [name],  Thank you for taking the time for your Medicare Wellness Visit. I appreciate your continued commitment to your health goals. Please review the care plan we discussed, and feel free to reach out if I can assist you further.  Medicare recommends these wellness visits once per year to help you and your care team stay ahead of potential health issues. These visits are designed to focus on prevention, allowing your provider to concentrate on managing your acute and chronic conditions during your regular appointments.  Please note that Annual Wellness Visits do not include a physical exam. Some assessments may be limited, especially if the visit was conducted virtually. If needed, we may recommend a separate in-person follow-up with your provider.  Ongoing Care Seeing your primary care provider every 3 to 6 months helps us  monitor your health and provide consistent, personalized care.  Remember to call and schedule your mammogram and Dexa and update your flu and shingles vaccines.   Referrals If a referral was made during today's visit and you haven't received any updates within two weeks, please contact the referred provider directly to check on the status.  Recommended Screenings:  Health Maintenance  Topic Date Due   Zoster (Shingles) Vaccine (1 of 2) Never done   DEXA scan (bone density measurement)  07/12/2018   Breast Cancer Screening  11/15/2021   Flu Shot  04/29/2024*   Medicare Annual Wellness Visit  10/14/2024   Pneumococcal Vaccine for age over 36  Completed   HPV Vaccine  Aged Out   Meningitis B Vaccine  Aged Out   DTaP/Tdap/Td vaccine  Discontinued   Colon Cancer Screening  Discontinued   COVID-19 Vaccine  Discontinued   Hepatitis C Screening  Discontinued  *Topic was postponed. The date shown is not the original due date.       10/15/2023   11:07 AM  Advanced Directives  Does Patient Have a Medical Advance Directive? No  Would patient like information on  creating a medical advance directive? No - Patient declined   Advance Care Planning is important because it: Ensures you receive medical care that aligns with your values, goals, and preferences. Provides guidance to your family and loved ones, reducing the emotional burden of decision-making during critical moments.  Vision: Annual vision screenings are recommended for early detection of glaucoma, cataracts, and diabetic retinopathy. These exams can also reveal signs of chronic conditions such as diabetes and high blood pressure.  Dental: Annual dental screenings help detect early signs of oral cancer, gum disease, and other conditions linked to overall health, including heart disease and diabetes.  Please see the attached documents for additional preventive care recommendations.   Managing Pain Without Opioids Opioids are strong medicines used to treat moderate to severe pain. For some people, especially those who have long-term (chronic) pain, opioids may not be the best choice for pain management due to: Side effects like nausea, constipation, and sleepiness. The risk of addiction (opioid use disorder). The longer you take opioids, the greater your risk of addiction. Pain that lasts for more than 3 months is called chronic pain. Managing chronic pain usually requires more than one approach and is often provided by a team of health care providers working together (multidisciplinary approach). Pain management may be done at a pain management center or pain clinic. How to manage pain without the use of opioids Use non-opioid medicines Non-opioid medicines for pain may include: Over-the-counter or prescription non-steroidal anti-inflammatory drugs (NSAIDs). These may be the first  medicines used for pain. They work well for muscle and bone pain, and they reduce swelling. Acetaminophen . This over-the-counter medicine may work well for milder pain but not swelling. Antidepressants. These may be  used to treat chronic pain. A certain type of antidepressant (tricyclics) is often used. These medicines are given in lower doses for pain than when used for depression. Anticonvulsants. These are usually used to treat seizures but may also reduce nerve (neuropathic) pain. Muscle relaxants. These relieve pain caused by sudden muscle tightening (spasms). You may also use a pain medicine that is applied to the skin as a patch, cream, or gel (topical analgesic), such as a numbing medicine. These may cause fewer side effects than medicines taken by mouth. Do certain therapies as directed Some therapies can help with pain management. They include: Physical therapy. You will do exercises to gain strength and flexibility. A physical therapist may teach you exercises to move and stretch parts of your body that are weak, stiff, or painful. You can learn these exercises at physical therapy visits and practice them at home. Physical therapy may also involve: Massage. Heat wraps or applying heat or cold to affected areas. Electrical signals that interrupt pain signals (transcutaneous electrical nerve stimulation, TENS). Weak lasers that reduce pain and swelling (low-level laser therapy). Signals from your body that help you learn to regulate pain (biofeedback). Occupational therapy. This helps you to learn ways to function at home and work with less pain. Recreational therapy. This involves trying new activities or hobbies, such as a physical activity or drawing. Mental health therapy, including: Cognitive behavioral therapy (CBT). This helps you learn coping skills for dealing with pain. Acceptance and commitment therapy (ACT) to change the way you think and react to pain. Relaxation therapies, including muscle relaxation exercises and mindfulness-based stress reduction. Pain management counseling. This may be individual, family, or group counseling.  Receive medical treatments Medical treatments for pain  management include: Nerve block injections. These may include a pain blocker and anti-inflammatory medicines. You may have injections: Near the spine to relieve chronic back or neck pain. Into joints to relieve back or joint pain. Into nerve areas that supply a painful area to relieve body pain. Into muscles (trigger point injections) to relieve some painful muscle conditions. A medical device placed near your spine to help block pain signals and relieve nerve pain or chronic back pain (spinal cord stimulation device). Acupuncture. Follow these instructions at home Medicines Take over-the-counter and prescription medicines only as told by your health care provider. If you are taking pain medicine, ask your health care providers about possible side effects to watch out for. Do not drive or use heavy machinery while taking prescription opioid pain medicine. Lifestyle  Do not use drugs or alcohol to reduce pain. If you drink alcohol, limit how much you have to: 0-1 drink a day for women who are not pregnant. 0-2 drinks a day for men. Know how much alcohol is in a drink. In the [place] one drink equals one 12 oz bottle of beer (355 mL), one 5 oz glass of wine (148 mL), or one 1 oz glass of hard liquor (44 mL). Do not use any products that contain nicotine or tobacco. These products include cigarettes, chewing tobacco, and vaping devices, such as e-cigarettes. If you need help quitting, ask your health care provider. Eat a healthy diet and maintain a healthy weight. Poor diet and excess weight may make pain worse. Eat foods that are high in fiber. These  include fresh fruits and vegetables, whole grains, and beans. Limit foods that are high in fat and processed sugars, such as fried and sweet foods. Exercise regularly. Exercise lowers stress and may help relieve pain. Ask your health care provider what activities and exercises are safe for you. If your health care provider approves, join an  exercise class that combines movement and stress reduction. Examples include yoga and tai chi. Get enough sleep. Lack of sleep may make pain worse. Lower stress as much as possible. Practice stress reduction techniques as told by your therapist. General instructions Work with all your pain management providers to find the treatments that work best for you. You are an important member of your pain management team. There are many things you can do to reduce pain on your own. Consider joining an online or in-person support group for people who have chronic pain. Keep all follow-up visits. This is important. Where to find more information You can find more information about managing pain without opioids from: American Academy of Pain Medicine: painmed.org Institute for Chronic Pain: instituteforchronicpain.org American Chronic Pain Association: theacpa.org Contact a health care provider if: You have side effects from pain medicine. Your pain gets worse or does not get better with treatments or home therapy. You are struggling with anxiety or depression. Summary Many types of pain can be managed without opioids. Chronic pain may respond better to pain management without opioids. Pain is best managed when you and a team of health care providers work together. Pain management without opioids may include non-opioid medicines, medical treatments, physical therapy, mental health therapy, and lifestyle changes. Tell your health care providers if your pain gets worse or is not being managed well enough. This information is not intended to replace advice given to you by your health care provider. Make sure you discuss any questions you have with your health care provider. Document Revised: 04/28/2020 Document Reviewed: 04/28/2020  Elsevier Patient Education  2024 [employer].

## 2023-10-15 NOTE — Progress Notes (Signed)
 Subjective:   Tina Duarte is a 78 y.o. who presents for a Medicare Wellness preventive visit.  As a reminder, Annual Wellness Visits don't include a physical exam, and some assessments may be limited, especially if this visit is performed virtually. We may recommend an in-person follow-up visit with your provider if needed.  Visit Complete: Virtual I connected with  Tina Duarte on 10/15/23 by a audio enabled telemedicine application and verified that I am speaking with the correct person using two identifiers.  Patient Location: Home  Provider Location: Home Office  I discussed the limitations of evaluation and management by telemedicine. The patient expressed understanding and agreed to proceed.  Vital Signs: Because this visit was a virtual/telehealth visit, some criteria may be missing or patient reported. Any vitals not documented were not able to be obtained and vitals that have been documented are patient reported.  VideoDeclined- This patient declined Librarian, academic. Therefore the visit was completed with audio only.  Persons Participating in Visit: Patient.  AWV Questionnaire: No: Patient Medicare AWV questionnaire was not completed prior to this visit.  Cardiac Risk Factors include: advanced age (>93men, >13 women);dyslipidemia;hypertension;obesity (BMI >30kg/m2);sedentary lifestyle     Objective:    Today's Vitals   10/15/23 1051  Weight: 210 lb (95.3 kg)  Height: 5' 4 (1.626 m)   Body mass index is 36.05 kg/m.     10/15/2023   11:07 AM 04/10/2023   12:56 PM 09/20/2022    3:55 PM 05/30/2022    3:19 PM 05/02/2022    1:47 PM 01/17/2022    3:10 PM 11/16/2021    1:08 PM  Advanced Directives  Does Patient Have a Medical Advance Directive? No No No No No No No  Would patient like information on creating a medical advance directive? No - Patient declined No - Patient declined Yes (MAU/Ambulatory/Procedural Areas - Information given)         Current Medications (verified) Outpatient Encounter Medications as of 10/15/2023  Medication Sig   albuterol  (ACCUNEB ) 0.63 MG/3ML nebulizer solution Take 3 mLs (0.63 mg total) by nebulization every 6 (six) hours as needed for wheezing.   albuterol  (VENTOLIN  HFA) 108 (90 Base) MCG/ACT inhaler Inhale 2 puffs into the lungs every 6 (six) hours as needed for wheezing or shortness of breath.   apixaban  (ELIQUIS ) 2.5 MG TABS tablet Take 1 tablet (2.5 mg total) by mouth 2 (two) times daily.   citalopram  (CELEXA ) 20 MG tablet Take 1 tablet (20 mg total) by mouth daily.   rosuvastatin  (CRESTOR ) 10 MG tablet Take 1 tablet (10 mg total) by mouth daily.   telmisartan  (MICARDIS ) 40 MG tablet Take 1 tablet (40 mg total) by mouth daily.   traMADol  (ULTRAM ) 50 MG tablet Take by mouth 3 (three) times daily as needed.   No facility-administered encounter medications on file as of 10/15/2023.    Allergies (verified) Patient has no known allergies.   History: Past Medical History:  Diagnosis Date   Allergy    Asthma    Chicken pox    COPD (chronic obstructive pulmonary disease) (HCC)    History of blood clots    Hypercholesterolemia    Hypertension    Retroperitoneal hematoma 03/2019   Past Surgical History:  Procedure Laterality Date   BIOPSY  03/08/2023   Procedure: BIOPSY;  Surgeon: Onita Elspeth Sharper, DO;  Location: [facility] ENDOSCOPY;  Service: Gastroenterology;;   blood clots  2008   COLONOSCOPY WITH PROPOFOL  N/A 03/08/2023  Procedure: COLONOSCOPY WITH PROPOFOL ;  Surgeon: Onita Elspeth Sharper, DO;  Location: [facility] ENDOSCOPY;  Service: Gastroenterology;  Laterality: N/A;   ESOPHAGOGASTRODUODENOSCOPY (EGD) WITH PROPOFOL  N/A 03/08/2023   Procedure: ESOPHAGOGASTRODUODENOSCOPY (EGD) WITH PROPOFOL ;  Surgeon: Onita Elspeth Sharper, DO;  Location: [facility] ENDOSCOPY;  Service: Gastroenterology;  Laterality: N/A;   MALONEY DILATION  03/08/2023   Procedure: AGAPITO DILATION;  Surgeon: Onita Elspeth Sharper,  DO;  Location: [facility] ENDOSCOPY;  Service: Gastroenterology;;   POLYPECTOMY  03/08/2023   Procedure: POLYPECTOMY;  Surgeon: Onita Elspeth Sharper, DO;  Location: [facility] ENDOSCOPY;  Service: Gastroenterology;;   TUBAL LIGATION     Family History  Problem Relation Age of Onset   Cancer Mother        Breast   Heart disease Mother    Stroke Mother    Hypertension Mother    Breast cancer Mother        37's   Heart disease Father    Stroke Father    Hypertension Father    Diabetes Father    Colon cancer Other        paternal cousin   Social History   Socioeconomic History   Marital status: Widowed    Spouse name: Not on file   Number of children: 2   Years of education: Not on file   Highest education level: Not on file  Occupational History   Occupation: retired Education officer, environmental houses  Tobacco Use   Smoking status: Never    Passive exposure: Past   Smokeless tobacco: Never   Tobacco comments:    Exposed to chemical and 2nd had smoke.  Vaping Use   Vaping status: Never Used  Substance and Sexual Activity   Alcohol use: No    Alcohol/week: 0.0 standard drinks of alcohol   Drug use: No   Sexual activity: Not on file  Other Topics Concern   Not on file  Social History Narrative   Lives in [place] with son, 1 daughter who lives local.  never smoked; no alcohol. 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   Social Drivers of Health   Financial Resource Strain: Low Risk  (10/15/2023)   Overall Financial Resource Strain (CARDIA)    Difficulty of Paying Living Expenses: Not hard at all  Food Insecurity: No Food Insecurity (10/15/2023)   Hunger Vital Sign    Worried About Running Out of Food in the Last Year: Never true    Ran Out of Food in the Last Year: Never true  Transportation Needs: No Transportation Needs (10/15/2023)   PRAPARE - Administrator, Civil Service (Medical): No    Lack of Transportation (Non-Medical): No  Physical Activity: Inactive (10/15/2023)    Exercise Vital Sign    Days of Exercise per Week: 0 days    Minutes of Exercise per Session: 0 min  Stress: No Stress Concern Present (10/15/2023)   [employer] of Occupational Health - Occupational Stress Questionnaire    Feeling of Stress: Not at all  Social Connections: Socially Isolated (10/15/2023)   Social Connection and Isolation Panel    Frequency of Communication with Friends and Family: More than three times a week    Frequency of Social Gatherings with Friends and Family: More than three times a week    Attends Religious Services: Never    Database administrator or Organizations: No    Attends Banker Meetings: Never    Marital Status: Widowed    Tobacco Counseling Counseling given: Not Answered  Tobacco comments: Exposed to chemical and 2nd had smoke.    Clinical Intake:  Pre-visit preparation completed: Yes  Pain : No/denies pain     BMI - recorded: 36.05 Nutritional Status: BMI > 30  Obese Nutritional Risks: None Diabetes: No  Lab Results  Component Value Date   HGBA1C 6.2 09/06/2023   HGBA1C 6.0 04/16/2023   HGBA1C 5.9 12/14/2022     How often do you need to have someone help you when you read instructions, pamphlets, or other written materials from your doctor or pharmacy?: 1 - Never  Interpreter Needed?: No  Information entered by :: R. Kanoa Phillippi LPN   Activities of Daily Living     10/15/2023   10:52 AM  In your present state of health, do you have any difficulty performing the following activities:  Hearing? 0  Vision? 0  Difficulty concentrating or making decisions? 1  Walking or climbing stairs? 1  Dressing or bathing? 1  Doing errands, shopping? 1  Preparing Food and eating ? N  Using the Toilet? N  In the past six months, have you accidently leaked urine? Y  Do you have problems with loss of bowel control? Y  Managing your Medications? N  Managing your Finances? N  Housekeeping or managing your Housekeeping? Y     Patient Care Team: Glendia Shad, MD as PCP - General (Internal Medicine) Myra Lynwood MATSU, MD as Consulting Physician (Anesthesiology)  I have updated your Care Teams any recent Medical Services you may have received from other providers in the past year.     Assessment:   This is a routine wellness examination for [place].  Hearing/Vision screen Hearing Screening - Comments:: Wears aids  Vision Screening - Comments:: readers   Goals Addressed             This Visit's Progress    Patient Stated       Wants to try to get out of the house more often       Depression Screen     10/15/2023   11:00 AM 04/16/2023    8:58 AM 04/10/2023   12:55 PM 09/20/2022    3:53 PM 05/30/2022    3:19 PM 05/02/2022    1:47 PM 02/15/2022    2:56 PM  PHQ 2/9 Scores  PHQ - 2 Score 0 0 0 0 0 0 1  PHQ- 9 Score 1 0  0       Fall Risk     10/15/2023   10:56 AM 04/16/2023    8:57 AM 04/10/2023   12:55 PM 09/20/2022    3:57 PM 05/30/2022    3:19 PM  Fall Risk   Falls in the past year? 1 1 0 1 0  Number falls in past yr: 1 0  1   Injury with Fall? 1 0  1   Comment did not have to go to the doctor      Risk for fall due to : History of fall(s);Impaired balance/gait History of fall(s)  Impaired mobility;History of fall(s);Medication side effect   Follow up Falls evaluation completed;Falls prevention discussed Falls evaluation completed  Falls prevention discussed;Falls evaluation completed     MEDICARE RISK AT HOME:  Medicare Risk at Home Any stairs in or around the home?: No If so, are there any without handrails?: No Home free of loose throw rugs in walkways, pet beds, electrical cords, etc?: Yes Adequate lighting in your home to reduce risk of falls?: Yes Life alert?: No Use  of a cane, walker or w/c?: Yes Grab bars in the bathroom?: Yes Shower chair or bench in shower?: Yes Elevated toilet seat or a handicapped toilet?: No  TIMED UP AND GO:  Was the test performed?  No  Cognitive  Function: 6CIT completed        10/15/2023   11:07 AM 09/20/2022    3:58 PM 10/21/2020    3:57 PM 08/20/2019   12:40 PM 08/19/2018    9:38 AM  6CIT Screen  What Year? 0 points 0 points 0 points 0 points 0 points  What month? 0 points 0 points 0 points 0 points 0 points  What time? 0 points 0 points 0 points  0 points  Count back from 20 0 points 0 points 0 points  0 points  Months in reverse 0 points 0 points 0 points 2 points 0 points  Repeat phrase 2 points 0 points  0 points 0 points  Total Score 2 points 0 points   0 points    Immunizations Immunization History  Administered Date(s) Administered   Fluad Quad(high Dose 65+) 12/11/2018, 01/11/2022   Fluad Trivalent(High Dose 65+) 12/14/2022   INFLUENZA, HIGH DOSE SEASONAL PF 04/10/2017   Influenza,inj,Quad PF,6+ Mos 10/21/2012, 01/07/2014, 03/07/2016   Influenza,inj,quad, With Preservative 03/02/2016   Influenza-Unspecified 02/28/2011, 12/16/2014, 09/28/2020   Pneumococcal Conjugate-13 04/30/2010   Pneumococcal Polysaccharide-23 05/23/2016    Screening Tests Health Maintenance  Topic Date Due   Zoster Vaccines- Shingrix (1 of 2) Never done   DEXA SCAN  07/12/2018   Mammogram  11/15/2021   Influenza Vaccine  04/29/2024 (Originally 08/31/2023)   Medicare Annual Wellness (AWV)  10/14/2024   Pneumococcal Vaccine: 50+ Years  Completed   HPV VACCINES  Aged Out   Meningococcal B Vaccine  Aged Out   DTaP/Tdap/Td  Discontinued   Colonoscopy  Discontinued   COVID-19 Vaccine  Discontinued   Hepatitis C Screening  Discontinued    Health Maintenance Items Addressed: Discussed the need to update flu and shingles vaccines. Patient was reminded to call and schedule mammogram and Dexa that was ordered previously. Patient stated that she already has the telephone number and will call and schedule.  Additional Screening:  Vision Screening: Recommended annual ophthalmology exams for early detection of glaucoma and other disorders of the  eye. Is the patient up to date with their annual eye exam?  Yes  Who is the provider or what is the name of the office in which the patient attends annual eye exams? [facility]  Dental Screening: Recommended annual dental exams for proper oral hygiene  Community Resource Referral / Chronic Care Management: CRR required this visit?  No   CCM required this visit?  No   Plan:    I have personally reviewed and noted the following in the patient's chart:   Medical and social history Use of alcohol, tobacco or illicit drugs  Current medications and supplements including opioid prescriptions. Patient is currently taking opioid prescriptions. Information provided to patient regarding non-opioid alternatives. Patient advised to discuss non-opioid treatment plan with their provider. Functional ability and status Nutritional status Physical activity Advanced directives List of other physicians Hospitalizations, surgeries, and ER visits in previous 12 months Vitals Screenings to include cognitive, depression, and falls Referrals and appointments  In addition, I have reviewed and discussed with patient certain preventive protocols, quality metrics, and best practice recommendations. A written personalized care plan for preventive services as well as general preventive health recommendations were provided to patient.  Angeline Fredericks, LPN   [id number]   After Visit Summary: (MyChart) Due to this being a telephonic visit, the after visit summary with patients personalized plan was offered to patient via MyChart   Notes: Nothing significant to report at this time.

## 2023-10-16 NOTE — Telephone Encounter (Signed)
 Per your last note, discussed PT but patient wanted to hold off. Ok to place referral?

## 2023-10-16 NOTE — Telephone Encounter (Signed)
 Patient requests home health PT. Referral has been pended for you

## 2023-10-16 NOTE — Telephone Encounter (Signed)
Order placed for home health PT.

## 2023-10-16 NOTE — Telephone Encounter (Signed)
 Please confirm if she is wanting home health PT. She has to get rides and does not drive - or was she wanting to go to PT at a facility.

## 2023-10-22 ENCOUNTER — Telehealth: Payer: Self-pay

## 2023-10-22 DIAGNOSIS — G8929 Other chronic pain: Secondary | ICD-10-CM | POA: Diagnosis not present

## 2023-10-22 DIAGNOSIS — J4489 Other specified chronic obstructive pulmonary disease: Secondary | ICD-10-CM | POA: Diagnosis not present

## 2023-10-22 DIAGNOSIS — I10 Essential (primary) hypertension: Secondary | ICD-10-CM | POA: Diagnosis not present

## 2023-10-22 DIAGNOSIS — E7800 Pure hypercholesterolemia, unspecified: Secondary | ICD-10-CM | POA: Diagnosis not present

## 2023-10-22 DIAGNOSIS — R739 Hyperglycemia, unspecified: Secondary | ICD-10-CM | POA: Diagnosis not present

## 2023-10-22 DIAGNOSIS — K5730 Diverticulosis of large intestine without perforation or abscess without bleeding: Secondary | ICD-10-CM | POA: Diagnosis not present

## 2023-10-22 DIAGNOSIS — M5442 Lumbago with sciatica, left side: Secondary | ICD-10-CM | POA: Diagnosis not present

## 2023-10-22 DIAGNOSIS — J9611 Chronic respiratory failure with hypoxia: Secondary | ICD-10-CM | POA: Diagnosis not present

## 2023-10-22 DIAGNOSIS — D6851 Activated protein C resistance: Secondary | ICD-10-CM | POA: Diagnosis not present

## 2023-10-22 NOTE — Telephone Encounter (Signed)
Verbals given  

## 2023-10-22 NOTE — Telephone Encounter (Signed)
 Copied from CRM #[id number]. Topic: Clinical - Home Health Verbal Orders >> Oct 22, 2023  2:24 PM Rea ORN wrote: Caller/Agency: [facility] Callback Number: [phone] Service Requested: Physical Therapy Frequency: 2 week 1, 1 week 6 Any new concerns about the patient? No

## 2023-10-22 NOTE — Telephone Encounter (Signed)
Ok for PT orders 

## 2023-10-22 NOTE — Telephone Encounter (Signed)
 Ok

## 2023-10-25 DIAGNOSIS — J4489 Other specified chronic obstructive pulmonary disease: Secondary | ICD-10-CM | POA: Diagnosis not present

## 2023-10-29 DIAGNOSIS — J4489 Other specified chronic obstructive pulmonary disease: Secondary | ICD-10-CM | POA: Diagnosis not present

## 2023-10-29 DIAGNOSIS — I10 Essential (primary) hypertension: Secondary | ICD-10-CM | POA: Diagnosis not present

## 2023-10-29 DIAGNOSIS — G8929 Other chronic pain: Secondary | ICD-10-CM | POA: Diagnosis not present

## 2023-10-29 DIAGNOSIS — R739 Hyperglycemia, unspecified: Secondary | ICD-10-CM | POA: Diagnosis not present

## 2023-10-29 DIAGNOSIS — M5442 Lumbago with sciatica, left side: Secondary | ICD-10-CM | POA: Diagnosis not present

## 2023-10-29 DIAGNOSIS — E7800 Pure hypercholesterolemia, unspecified: Secondary | ICD-10-CM | POA: Diagnosis not present

## 2023-10-29 DIAGNOSIS — D6851 Activated protein C resistance: Secondary | ICD-10-CM | POA: Diagnosis not present

## 2023-10-29 DIAGNOSIS — J9611 Chronic respiratory failure with hypoxia: Secondary | ICD-10-CM | POA: Diagnosis not present

## 2023-11-05 DIAGNOSIS — K5730 Diverticulosis of large intestine without perforation or abscess without bleeding: Secondary | ICD-10-CM | POA: Diagnosis not present

## 2023-11-05 DIAGNOSIS — I10 Essential (primary) hypertension: Secondary | ICD-10-CM | POA: Diagnosis not present

## 2023-11-05 DIAGNOSIS — R739 Hyperglycemia, unspecified: Secondary | ICD-10-CM | POA: Diagnosis not present

## 2023-11-05 DIAGNOSIS — G8929 Other chronic pain: Secondary | ICD-10-CM | POA: Diagnosis not present

## 2023-11-05 DIAGNOSIS — E7800 Pure hypercholesterolemia, unspecified: Secondary | ICD-10-CM | POA: Diagnosis not present

## 2023-11-05 DIAGNOSIS — M5442 Lumbago with sciatica, left side: Secondary | ICD-10-CM | POA: Diagnosis not present

## 2023-11-05 DIAGNOSIS — J4489 Other specified chronic obstructive pulmonary disease: Secondary | ICD-10-CM | POA: Diagnosis not present

## 2023-11-05 DIAGNOSIS — J9611 Chronic respiratory failure with hypoxia: Secondary | ICD-10-CM | POA: Diagnosis not present

## 2023-11-05 DIAGNOSIS — D6851 Activated protein C resistance: Secondary | ICD-10-CM | POA: Diagnosis not present

## 2023-11-18 DIAGNOSIS — R32 Unspecified urinary incontinence: Secondary | ICD-10-CM | POA: Diagnosis not present

## 2023-11-18 DIAGNOSIS — J4489 Other specified chronic obstructive pulmonary disease: Secondary | ICD-10-CM | POA: Diagnosis not present

## 2023-11-18 DIAGNOSIS — F325 Major depressive disorder, single episode, in full remission: Secondary | ICD-10-CM | POA: Diagnosis not present

## 2023-11-18 DIAGNOSIS — Z7901 Long term (current) use of anticoagulants: Secondary | ICD-10-CM | POA: Diagnosis not present

## 2023-11-18 DIAGNOSIS — K224 Dyskinesia of esophagus: Secondary | ICD-10-CM | POA: Diagnosis not present

## 2023-11-18 DIAGNOSIS — M519 Unspecified thoracic, thoracolumbar and lumbosacral intervertebral disc disorder: Secondary | ICD-10-CM | POA: Diagnosis not present

## 2023-11-18 DIAGNOSIS — D6851 Activated protein C resistance: Secondary | ICD-10-CM | POA: Diagnosis not present

## 2023-11-18 DIAGNOSIS — H9193 Unspecified hearing loss, bilateral: Secondary | ICD-10-CM | POA: Diagnosis not present

## 2023-11-18 DIAGNOSIS — E785 Hyperlipidemia, unspecified: Secondary | ICD-10-CM | POA: Diagnosis not present

## 2023-11-18 DIAGNOSIS — M1990 Unspecified osteoarthritis, unspecified site: Secondary | ICD-10-CM | POA: Diagnosis not present

## 2023-11-18 DIAGNOSIS — M4800 Spinal stenosis, site unspecified: Secondary | ICD-10-CM | POA: Diagnosis not present

## 2023-11-18 DIAGNOSIS — M5430 Sciatica, unspecified side: Secondary | ICD-10-CM | POA: Diagnosis not present

## 2023-11-18 DIAGNOSIS — I10 Essential (primary) hypertension: Secondary | ICD-10-CM | POA: Diagnosis not present

## 2023-11-18 DIAGNOSIS — I700 Atherosclerosis of aorta: Secondary | ICD-10-CM | POA: Diagnosis not present

## 2023-11-18 DIAGNOSIS — Z9981 Dependence on supplemental oxygen: Secondary | ICD-10-CM | POA: Diagnosis not present

## 2023-11-18 DIAGNOSIS — Z9181 History of falling: Secondary | ICD-10-CM | POA: Diagnosis not present

## 2023-11-18 DIAGNOSIS — D6859 Other primary thrombophilia: Secondary | ICD-10-CM | POA: Diagnosis not present

## 2023-11-18 DIAGNOSIS — J309 Allergic rhinitis, unspecified: Secondary | ICD-10-CM | POA: Diagnosis not present

## 2023-11-19 DIAGNOSIS — J4489 Other specified chronic obstructive pulmonary disease: Secondary | ICD-10-CM | POA: Diagnosis not present

## 2023-11-27 DIAGNOSIS — M5442 Lumbago with sciatica, left side: Secondary | ICD-10-CM | POA: Diagnosis not present

## 2023-11-27 DIAGNOSIS — K5730 Diverticulosis of large intestine without perforation or abscess without bleeding: Secondary | ICD-10-CM | POA: Diagnosis not present

## 2023-11-27 DIAGNOSIS — J9611 Chronic respiratory failure with hypoxia: Secondary | ICD-10-CM | POA: Diagnosis not present

## 2023-11-27 DIAGNOSIS — R739 Hyperglycemia, unspecified: Secondary | ICD-10-CM | POA: Diagnosis not present

## 2023-11-27 DIAGNOSIS — I10 Essential (primary) hypertension: Secondary | ICD-10-CM | POA: Diagnosis not present

## 2023-11-27 DIAGNOSIS — J4489 Other specified chronic obstructive pulmonary disease: Secondary | ICD-10-CM | POA: Diagnosis not present

## 2023-11-27 DIAGNOSIS — E7800 Pure hypercholesterolemia, unspecified: Secondary | ICD-10-CM | POA: Diagnosis not present

## 2023-11-27 DIAGNOSIS — D6851 Activated protein C resistance: Secondary | ICD-10-CM | POA: Diagnosis not present

## 2024-01-03 ENCOUNTER — Encounter: Payer: Self-pay | Admitting: Anesthesiology

## 2024-01-03 ENCOUNTER — Ambulatory Visit: Attending: Anesthesiology | Admitting: Anesthesiology

## 2024-01-03 DIAGNOSIS — M47816 Spondylosis without myelopathy or radiculopathy, lumbar region: Secondary | ICD-10-CM

## 2024-01-03 DIAGNOSIS — M48062 Spinal stenosis, lumbar region with neurogenic claudication: Secondary | ICD-10-CM

## 2024-01-03 DIAGNOSIS — F1190 Opioid use, unspecified, uncomplicated: Secondary | ICD-10-CM

## 2024-01-03 DIAGNOSIS — M25551 Pain in right hip: Secondary | ICD-10-CM

## 2024-01-03 DIAGNOSIS — M1711 Unilateral primary osteoarthritis, right knee: Secondary | ICD-10-CM

## 2024-01-03 DIAGNOSIS — G894 Chronic pain syndrome: Secondary | ICD-10-CM

## 2024-01-03 MED ORDER — TRAMADOL HCL 50 MG PO TABS: 100.0000 mg | ORAL_TABLET | Freq: Three times a day (TID) | ORAL | 1 refills | Status: AC | PRN

## 2024-01-03 NOTE — Progress Notes (Unsigned)
 Virtual Visit via Telephone Note  I connected with Tina Duarte on 01/03/24 at  9:40 AM EST by telephone and verified that I am speaking with the correct person using two identifiers.  Location: Patient: Home Provider: [facility]   I discussed the limitations, risks, security and privacy concerns of performing an evaluation and management service by telephone and the availability of in person appointments. I also discussed with the patient that there may be a patient responsible charge related to this service. The patient expressed understanding and agreed to proceed.   History of Present Illness: I spoke with Tina Duarte via telephone as we were unable to link for the video portion of the conference but she reports that she is doing well.  She was last seen several months ago and continues to have the same quality characteristic and distribution of low back pain and hip pain.  She takes her tramadol  2 tablets in the morning 2 in the afternoon and 2 in the early evening and this regimen continues to work well for her.  Otherwise she is in her usual state of health with no new changes today.  No side effects with her medications are noted.  Review of systems: General: No fevers or chills Pulmonary: No shortness of breath or dyspnea Cardiac: No angina or palpitations or lightheadedness GI: No abdominal pain or constipation Psych: No depression    Observations/Objective:  Current Outpatient Medications:    albuterol  (ACCUNEB ) 0.63 MG/3ML nebulizer solution, Take 3 mLs (0.63 mg total) by nebulization every 6 (six) hours as needed for wheezing., Disp: 75 mL, Rfl: 3   albuterol  (VENTOLIN  HFA) 108 (90 Base) MCG/ACT inhaler, Inhale 2 puffs into the lungs every 6 (six) hours as needed for wheezing or shortness of breath., Disp: 18 g, Rfl: 2   apixaban  (ELIQUIS ) 2.5 MG TABS tablet, Take 1 tablet (2.5 mg total) by mouth 2 (two) times daily., Disp: 180 tablet, Rfl: 1   citalopram  (CELEXA ) 20  MG tablet, Take 1 tablet (20 mg total) by mouth daily., Disp: 90 tablet, Rfl: 1   rosuvastatin  (CRESTOR ) 10 MG tablet, Take 1 tablet (10 mg total) by mouth daily., Disp: 90 tablet, Rfl: 3   telmisartan  (MICARDIS ) 40 MG tablet, Take 1 tablet (40 mg total) by mouth daily., Disp: 90 tablet, Rfl: 1   traMADol  (ULTRAM ) 50 MG tablet, Take 2 tablets (100 mg total) by mouth 3 (three) times daily as needed., Disp: 180 tablet, Rfl: 1  Past Medical History:  Diagnosis Date   Allergy    Asthma    Chicken pox    COPD (chronic obstructive pulmonary disease) (HCC)    History of blood clots    Hypercholesterolemia    Hypertension    Retroperitoneal hematoma 03/2019    Assessment and Plan: 1. Primary osteoarthritis of right knee   2. Spinal stenosis of lumbar region with neurogenic claudication   3. Hip pain, bilateral   4. Chronic pain syndrome   5. Facet arthritis of lumbar region   6. Chronic, continuous use of opioids    Based on our conversation and after review of the [place]  practitioner database information it is appropriate to refill her medicines for the next 2 months.  Will continue her on tramadol  2 tablets in the morning afternoon and evening.  Continue stretching strengthening exercises with scheduled return to clinic in 2 months.  Continue follow-up with her primary care physician for baseline medical care.  Follow Up Instructions:  I discussed the assessment and treatment plan with the patient. The patient was provided an opportunity to ask questions and all were answered. The patient agreed with the plan and demonstrated an understanding of the instructions.   The patient was advised to call back or seek an in-person evaluation if the symptoms worsen or if the condition fails to improve as anticipated.  I provided 30 minutes of non-face-to-face time during this encounter.   Lynwood KANDICE Clause, MD

## 2024-01-07 ENCOUNTER — Encounter: Payer: Self-pay | Admitting: Internal Medicine

## 2024-01-07 ENCOUNTER — Ambulatory Visit (INDEPENDENT_AMBULATORY_CARE_PROVIDER_SITE_OTHER): Admitting: Internal Medicine

## 2024-01-07 DIAGNOSIS — R739 Hyperglycemia, unspecified: Secondary | ICD-10-CM

## 2024-01-07 DIAGNOSIS — I10 Essential (primary) hypertension: Secondary | ICD-10-CM

## 2024-01-07 DIAGNOSIS — E7800 Pure hypercholesterolemia, unspecified: Secondary | ICD-10-CM

## 2024-01-07 DIAGNOSIS — Z780 Asymptomatic menopausal state: Secondary | ICD-10-CM

## 2024-01-07 DIAGNOSIS — Z1231 Encounter for screening mammogram for malignant neoplasm of breast: Secondary | ICD-10-CM

## 2024-01-07 DIAGNOSIS — E2839 Other primary ovarian failure: Secondary | ICD-10-CM

## 2024-01-07 NOTE — Progress Notes (Deleted)
 Subjective:    [name], female    DOB: Jan 19, 1946, 78 y.o.   MRN: [id number]  Patient here for No chief complaint on file.   HPI Here for a scheduled follow up - follow up regarding hypertension, hypercholesterolemia and hyperglycemia. S/p colonoscopy 03/08/23 - hemorrhoids, one 1-3mm polyp in the sigmoid colon and diverticulosis. Continues on oxygen . Followed at pain clinic - chronic low back pain - taking tramadol .    Past Medical History:  Diagnosis Date   Allergy    Asthma    Chicken pox    COPD (chronic obstructive pulmonary disease) (HCC)    History of blood clots    Hypercholesterolemia    Hypertension    Retroperitoneal hematoma 03/2019   Past Surgical History:  Procedure Laterality Date   BIOPSY  03/08/2023   Procedure: BIOPSY;  Surgeon: Onita Elspeth Sharper, DO;  Location: [facility] ENDOSCOPY;  Service: Gastroenterology;;   blood clots  2008   COLONOSCOPY WITH PROPOFOL  N/A 03/08/2023   Procedure: COLONOSCOPY WITH PROPOFOL ;  Surgeon: Onita Elspeth Sharper, DO;  Location[facility] ENDOSCOPY;  Service: Gastroenterology;  Laterality: N/A;   ESOPHAGOGASTRODUODENOSCOPY (EGD) WITH PROPOFOL  N/A 03/08/2023   Procedure: ESOPHAGOGASTRODUODENOSCOPY (EGD) WITH PROPOFOL ;  Surgeon: Onita Elspeth Sharper, DO;  Location: [facility] ENDOSCOPY;  Service: Gastroenterology;  Laterality: N/A;   MALONEY DILATION  03/08/2023   Procedure: AGAPITO DILATION;  Surgeon: Onita Elspeth Sharper, DO;  Location: [facility] ENDOSCOPY;  Service: Gastroenterology;;   POLYPECTOMY  03/08/2023   Procedure: POLYPECTOMY;  Surgeon: Onita Elspeth Sharper, DO;  Location: [facility] ENDOSCOPY;  Service: Gastroenterology;;   TUBAL LIGATION     Family History  Problem Relation Age of Onset   Cancer Mother        Breast   Heart disease Mother    Stroke Mother    Hypertension Mother    Breast cancer Mother        4's   Heart disease Father    Stroke Father    Hypertension Father    Diabetes Father    Colon cancer Other         paternal cousin   Social History   Socioeconomic History   Marital status: Widowed    Spouse name: Not on file   Number of children: 2   Years of education: Not on file   Highest education level: Not on file  Occupational History   Occupation: retired education officer, environmental houses  Tobacco Use   Smoking status: Never    Passive exposure: Past   Smokeless tobacco: Never   Tobacco comments:    Exposed to chemical and 2nd had smoke.  Vaping Use   Vaping status: Never Used  Substance and Sexual Activity   Alcohol use: No    Alcohol/week: 0.0 standard drinks of alcohol   Drug use: No   Sexual activity: Not on file  Other Topics Concern   Not on file  Social History Narrative   Lives in [place] with son, 1 daughter who lives local.  never smoked; no alcohol. 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   Social Drivers of Health   Financial Resource Strain: Low Risk  (10/15/2023)   Overall Financial Resource Strain (CARDIA)    Difficulty of Paying Living Expenses: Not hard at all  Food Insecurity: No Food Insecurity (10/15/2023)   Hunger Vital Sign    Worried About Running Out of Food in the Last Year: Never true    Ran Out of Food in the Last Year: Never  true  Transportation Needs: No Transportation Needs (10/15/2023)   PRAPARE - Administrator, Civil Service (Medical): No    Lack of Transportation (Non-Medical): No  Physical Activity: Inactive (10/15/2023)   Exercise Vital Sign    Days of Exercise per Week: 0 days    Minutes of Exercise per Session: 0 min  Stress: No Stress Concern Present (10/15/2023)   [employer] of Occupational Health - Occupational Stress Questionnaire    Feeling of Stress: Not at all  Social Connections: Socially Isolated (10/15/2023)   Social Connection and Isolation Panel    Frequency of Communication with Friends and Family: More than three times a week    Frequency of Social Gatherings with Friends and Family: More than three times a  week    Attends Religious Services: Never    Database Administrator or Organizations: No    Attends Banker Meetings: Never    Marital Status: Widowed     Review of Systems     Objective:     LMP 04/29/1997  Wt Readings from Last 3 Encounters:  10/15/23 210 lb (95.3 kg)  09/06/23 213 lb 8 oz (96.8 kg)  04/16/23 210 lb 3.2 oz (95.3 kg)    Physical Exam  {Perform Simple Foot Exam  Perform Detailed exam:1} {Insert foot Exam (Optional):30965}   Outpatient Encounter Medications as of 01/07/2024  Medication Sig   albuterol  (ACCUNEB ) 0.63 MG/3ML nebulizer solution Take 3 mLs (0.63 mg total) by nebulization every 6 (six) hours as needed for wheezing.   albuterol  (VENTOLIN  HFA) 108 (90 Base) MCG/ACT inhaler Inhale 2 puffs into the lungs every 6 (six) hours as needed for wheezing or shortness of breath.   apixaban  (ELIQUIS ) 2.5 MG TABS tablet Take 1 tablet (2.5 mg total) by mouth 2 (two) times daily.   citalopram  (CELEXA ) 20 MG tablet Take 1 tablet (20 mg total) by mouth daily.   rosuvastatin  (CRESTOR ) 10 MG tablet Take 1 tablet (10 mg total) by mouth daily.   telmisartan  (MICARDIS ) 40 MG tablet Take 1 tablet (40 mg total) by mouth daily.   traMADol  (ULTRAM ) 50 MG tablet Take 2 tablets (100 mg total) by mouth 3 (three) times daily as needed.   [DISCONTINUED] traMADol  (ULTRAM ) 50 MG tablet Take by mouth 3 (three) times daily as needed.   No facility-administered encounter medications on file as of 01/07/2024.     Lab Results  Component Value Date   WBC 7.2 04/16/2023   HGB 14.2 04/16/2023   HCT 42.7 04/16/2023   PLT 245.0 04/16/2023   GLUCOSE 78 09/06/2023   CHOL 138 09/06/2023   TRIG 131.0 09/06/2023   HDL 44.50 09/06/2023   LDLDIRECT 81.0 04/13/2022   LDLCALC 68 09/06/2023   ALT 11 09/06/2023   AST 16 09/06/2023   NA 138 09/06/2023   K 4.5 09/06/2023   CL 100 09/06/2023   CREATININE 1.15 09/06/2023   BUN 16 09/06/2023   CO2 28 09/06/2023   TSH 3.48  04/16/2023   INR 1.2 04/23/2019   HGBA1C 6.2 09/06/2023    No results found.     Assessment & Plan:  Postmenopausal  Hypercholesterolemia  Hyperglycemia  Essential hypertension, benign     Allena Hamilton, MD

## 2024-01-07 NOTE — Progress Notes (Signed)
 [name], female   DOB: Feb 25, 1945, 78 y.o.   MRN: [id number] Did not show for appt.

## 2024-01-13 ENCOUNTER — Telehealth: Payer: Self-pay | Admitting: Internal Medicine

## 2024-01-13 DIAGNOSIS — Z1231 Encounter for screening mammogram for malignant neoplasm of breast: Secondary | ICD-10-CM

## 2024-01-13 NOTE — Telephone Encounter (Signed)
 Received notification that she is overdue mammogram. Order placed. See if agreeable to schedule. Thanks.

## 2024-01-16 NOTE — Telephone Encounter (Signed)
 Spoke with pt. Pt stated she would call and schedule her appt

## 2024-02-29 ENCOUNTER — Ambulatory Visit: Admitting: Internal Medicine

## 2024-05-05 ENCOUNTER — Ambulatory Visit: Admitting: Internal Medicine

## 2024-05-06 ENCOUNTER — Encounter: Admitting: Internal Medicine

## 2024-10-15 ENCOUNTER — Ambulatory Visit
